# Patient Record
Sex: Female | Born: 1940 | Race: Black or African American | Hispanic: No | Smoking: Former smoker
Health system: Southern US, Community
[De-identification: ages and names within clinical notes are randomized; demographics above are authoritative.]

## PROBLEM LIST (undated history)

## (undated) DIAGNOSIS — M79673 Pain in unspecified foot: Secondary | ICD-10-CM

## (undated) DIAGNOSIS — R5382 Chronic fatigue, unspecified: Secondary | ICD-10-CM

## (undated) DIAGNOSIS — C3490 Malignant neoplasm of unspecified part of unspecified bronchus or lung: Secondary | ICD-10-CM

## (undated) DIAGNOSIS — C3491 Malignant neoplasm of unspecified part of right bronchus or lung: Principal | ICD-10-CM

## (undated) DIAGNOSIS — I10 Essential (primary) hypertension: Secondary | ICD-10-CM

## (undated) DIAGNOSIS — Z5112 Encounter for antineoplastic immunotherapy: Secondary | ICD-10-CM

## (undated) DIAGNOSIS — M25559 Pain in unspecified hip: Secondary | ICD-10-CM

## (undated) DIAGNOSIS — E7800 Pure hypercholesterolemia, unspecified: Secondary | ICD-10-CM

## (undated) DIAGNOSIS — C3492 Malignant neoplasm of unspecified part of left bronchus or lung: Principal | ICD-10-CM

## (undated) DIAGNOSIS — E039 Hypothyroidism, unspecified: Secondary | ICD-10-CM

## (undated) DIAGNOSIS — C419 Malignant neoplasm of bone and articular cartilage, unspecified: Secondary | ICD-10-CM

## (undated) DIAGNOSIS — R300 Dysuria: Secondary | ICD-10-CM

## (undated) HISTORY — DX: Dysuria: R30.0

## (undated) HISTORY — DX: Encounter for antineoplastic immunotherapy: Z51.12

## (undated) HISTORY — DX: Pure hypercholesterolemia, unspecified: E78.00

## (undated) HISTORY — DX: Pain in unspecified foot: M79.673

## (undated) HISTORY — DX: Chronic fatigue, unspecified: R53.82

## (undated) HISTORY — DX: Hypothyroidism, unspecified: E03.9

## (undated) HISTORY — DX: Pain in unspecified hip: M25.559

## (undated) HISTORY — DX: Essential (primary) hypertension: I10

## (undated) HISTORY — DX: Malignant neoplasm of unspecified part of left bronchus or lung: C34.92

## (undated) HISTORY — DX: Malignant neoplasm of unspecified part of right bronchus or lung: C34.91

---

## 1998-03-13 ENCOUNTER — Other Ambulatory Visit: Admission: RE | Admit: 1998-03-13 | Discharge: 1998-03-13 | Payer: Self-pay | Admitting: Emergency Medicine

## 2000-03-28 ENCOUNTER — Encounter: Admission: RE | Admit: 2000-03-28 | Discharge: 2000-03-28 | Payer: Self-pay | Admitting: Emergency Medicine

## 2000-03-28 ENCOUNTER — Encounter: Payer: Self-pay | Admitting: Emergency Medicine

## 2003-05-03 ENCOUNTER — Encounter: Admission: RE | Admit: 2003-05-03 | Discharge: 2003-05-03 | Payer: Self-pay | Admitting: Emergency Medicine

## 2003-05-03 ENCOUNTER — Encounter: Payer: Self-pay | Admitting: Emergency Medicine

## 2003-07-13 ENCOUNTER — Encounter (INDEPENDENT_AMBULATORY_CARE_PROVIDER_SITE_OTHER): Payer: Self-pay | Admitting: Specialist

## 2003-07-13 ENCOUNTER — Ambulatory Visit (HOSPITAL_COMMUNITY): Admission: RE | Admit: 2003-07-13 | Discharge: 2003-07-13 | Payer: Self-pay | Admitting: Gastroenterology

## 2004-12-28 ENCOUNTER — Encounter: Admission: RE | Admit: 2004-12-28 | Discharge: 2004-12-28 | Payer: Self-pay | Admitting: Emergency Medicine

## 2005-05-30 ENCOUNTER — Other Ambulatory Visit: Admission: RE | Admit: 2005-05-30 | Discharge: 2005-05-30 | Payer: Self-pay | Admitting: Internal Medicine

## 2006-01-07 ENCOUNTER — Encounter: Admission: RE | Admit: 2006-01-07 | Discharge: 2006-01-07 | Payer: Self-pay | Admitting: Endocrinology

## 2006-01-31 ENCOUNTER — Encounter: Admission: RE | Admit: 2006-01-31 | Discharge: 2006-01-31 | Payer: Self-pay | Admitting: Endocrinology

## 2007-02-25 ENCOUNTER — Encounter: Admission: RE | Admit: 2007-02-25 | Discharge: 2007-02-25 | Payer: Self-pay | Admitting: Orthopaedic Surgery

## 2007-09-22 ENCOUNTER — Other Ambulatory Visit: Admission: RE | Admit: 2007-09-22 | Discharge: 2007-09-22 | Payer: Self-pay | Admitting: Pediatrics

## 2007-09-24 ENCOUNTER — Encounter: Admission: RE | Admit: 2007-09-24 | Discharge: 2007-09-24 | Payer: Self-pay | Admitting: Family Medicine

## 2009-08-25 ENCOUNTER — Encounter: Admission: RE | Admit: 2009-08-25 | Discharge: 2009-08-25 | Payer: Self-pay | Admitting: Emergency Medicine

## 2009-09-05 ENCOUNTER — Ambulatory Visit: Payer: Self-pay | Admitting: Thoracic Surgery

## 2009-09-14 ENCOUNTER — Ambulatory Visit (HOSPITAL_COMMUNITY): Admission: RE | Admit: 2009-09-14 | Discharge: 2009-09-14 | Payer: Self-pay | Admitting: Emergency Medicine

## 2009-09-14 ENCOUNTER — Ambulatory Visit (HOSPITAL_COMMUNITY): Admission: RE | Admit: 2009-09-14 | Discharge: 2009-09-14 | Payer: Self-pay | Admitting: Thoracic Surgery

## 2009-09-19 ENCOUNTER — Ambulatory Visit: Payer: Self-pay | Admitting: Thoracic Surgery

## 2009-09-28 ENCOUNTER — Encounter: Payer: Self-pay | Admitting: Thoracic Surgery

## 2009-09-28 ENCOUNTER — Ambulatory Visit: Payer: Self-pay | Admitting: Thoracic Surgery

## 2009-09-28 ENCOUNTER — Inpatient Hospital Stay (HOSPITAL_COMMUNITY): Admission: RE | Admit: 2009-09-28 | Discharge: 2009-10-08 | Payer: Self-pay | Admitting: Thoracic Surgery

## 2009-09-28 ENCOUNTER — Other Ambulatory Visit: Payer: Self-pay | Admitting: Thoracic Surgery

## 2009-09-28 HISTORY — PX: VIDEO ASSISTED THORACOSCOPY: SHX5073

## 2009-09-28 HISTORY — PX: LUNG LOBECTOMY: SHX167

## 2009-09-28 HISTORY — PX: THORACOTOMY: SHX5074

## 2009-10-18 ENCOUNTER — Encounter: Admission: RE | Admit: 2009-10-18 | Discharge: 2009-10-18 | Payer: Self-pay | Admitting: Thoracic Surgery

## 2009-10-18 ENCOUNTER — Ambulatory Visit: Payer: Self-pay | Admitting: Thoracic Surgery

## 2009-11-22 ENCOUNTER — Ambulatory Visit: Payer: Self-pay | Admitting: Thoracic Surgery

## 2009-11-22 ENCOUNTER — Encounter: Admission: RE | Admit: 2009-11-22 | Discharge: 2009-11-22 | Payer: Self-pay | Admitting: Thoracic Surgery

## 2009-11-22 ENCOUNTER — Ambulatory Visit: Payer: Self-pay | Admitting: Internal Medicine

## 2009-11-29 LAB — COMPREHENSIVE METABOLIC PANEL
Albumin: 4.5 g/dL (ref 3.5–5.2)
BUN: 10 mg/dL (ref 6–23)
CO2: 23 mEq/L (ref 19–32)
Calcium: 10.1 mg/dL (ref 8.4–10.5)
Chloride: 107 mEq/L (ref 96–112)
Glucose, Bld: 97 mg/dL (ref 70–99)
Potassium: 3.9 mEq/L (ref 3.5–5.3)
Sodium: 143 mEq/L (ref 135–145)
Total Protein: 7.7 g/dL (ref 6.0–8.3)

## 2009-11-29 LAB — CBC WITH DIFFERENTIAL/PLATELET
Basophils Absolute: 0 10*3/uL (ref 0.0–0.1)
Eosinophils Absolute: 0.2 10*3/uL (ref 0.0–0.5)
HGB: 12 g/dL (ref 11.6–15.9)
MCV: 88.8 fL (ref 79.5–101.0)
MONO#: 0.5 10*3/uL (ref 0.1–0.9)
NEUT#: 5.6 10*3/uL (ref 1.5–6.5)
RBC: 4.07 10*6/uL (ref 3.70–5.45)
RDW: 16.6 % — ABNORMAL HIGH (ref 11.2–14.5)
WBC: 8.2 10*3/uL (ref 3.9–10.3)

## 2010-01-30 ENCOUNTER — Ambulatory Visit: Payer: Self-pay | Admitting: Thoracic Surgery

## 2010-01-30 ENCOUNTER — Encounter: Admission: RE | Admit: 2010-01-30 | Discharge: 2010-01-30 | Payer: Self-pay | Admitting: Thoracic Surgery

## 2010-02-21 ENCOUNTER — Ambulatory Visit: Payer: Self-pay | Admitting: Internal Medicine

## 2010-02-22 ENCOUNTER — Ambulatory Visit (HOSPITAL_COMMUNITY): Admission: RE | Admit: 2010-02-22 | Discharge: 2010-02-22 | Payer: Self-pay | Admitting: Internal Medicine

## 2010-02-22 LAB — COMPREHENSIVE METABOLIC PANEL
Albumin: 4 g/dL (ref 3.5–5.2)
Alkaline Phosphatase: 70 U/L (ref 39–117)
BUN: 11 mg/dL (ref 6–23)
CO2: 27 mEq/L (ref 19–32)
Glucose, Bld: 86 mg/dL (ref 70–99)
Potassium: 3.9 mEq/L (ref 3.5–5.3)
Sodium: 141 mEq/L (ref 135–145)
Total Bilirubin: 0.4 mg/dL (ref 0.3–1.2)
Total Protein: 7.1 g/dL (ref 6.0–8.3)

## 2010-02-22 LAB — CBC WITH DIFFERENTIAL/PLATELET
Basophils Absolute: 0 10*3/uL (ref 0.0–0.1)
Eosinophils Absolute: 0.1 10*3/uL (ref 0.0–0.5)
HCT: 38.2 % (ref 34.8–46.6)
HGB: 12.9 g/dL (ref 11.6–15.9)
LYMPH%: 25.8 % (ref 14.0–49.7)
MCV: 88 fL (ref 79.5–101.0)
MONO#: 0.5 10*3/uL (ref 0.1–0.9)
MONO%: 7.7 % (ref 0.0–14.0)
NEUT#: 4.5 10*3/uL (ref 1.5–6.5)
Platelets: 215 10*3/uL (ref 145–400)
WBC: 7 10*3/uL (ref 3.9–10.3)

## 2010-02-27 ENCOUNTER — Ambulatory Visit: Payer: Self-pay | Admitting: Thoracic Surgery

## 2010-08-22 ENCOUNTER — Ambulatory Visit: Payer: Self-pay | Admitting: Internal Medicine

## 2010-08-30 ENCOUNTER — Ambulatory Visit (HOSPITAL_COMMUNITY): Admission: RE | Admit: 2010-08-30 | Discharge: 2010-08-30 | Payer: Self-pay | Admitting: Internal Medicine

## 2010-08-30 LAB — CBC WITH DIFFERENTIAL/PLATELET
BASO%: 0.6 % (ref 0.0–2.0)
Basophils Absolute: 0 10*3/uL (ref 0.0–0.1)
EOS%: 2.2 % (ref 0.0–7.0)
HGB: 12.8 g/dL (ref 11.6–15.9)
MCH: 31.2 pg (ref 25.1–34.0)
MCHC: 33.4 g/dL (ref 31.5–36.0)
MCV: 93.2 fL (ref 79.5–101.0)
MONO%: 7.2 % (ref 0.0–14.0)
RDW: 15.3 % — ABNORMAL HIGH (ref 11.2–14.5)

## 2010-08-30 LAB — COMPREHENSIVE METABOLIC PANEL
ALT: 15 U/L (ref 0–35)
AST: 14 U/L (ref 0–37)
Albumin: 4.2 g/dL (ref 3.5–5.2)
Alkaline Phosphatase: 50 U/L (ref 39–117)
BUN: 10 mg/dL (ref 6–23)
Chloride: 109 mEq/L (ref 96–112)
Potassium: 4 mEq/L (ref 3.5–5.3)
Sodium: 145 mEq/L (ref 135–145)
Total Protein: 7.5 g/dL (ref 6.0–8.3)

## 2010-09-11 ENCOUNTER — Ambulatory Visit: Payer: Self-pay | Admitting: Thoracic Surgery

## 2010-11-02 ENCOUNTER — Other Ambulatory Visit: Payer: Self-pay | Admitting: Internal Medicine

## 2010-11-02 DIAGNOSIS — Z85118 Personal history of other malignant neoplasm of bronchus and lung: Secondary | ICD-10-CM

## 2011-01-14 LAB — CBC
HCT: 26 % — ABNORMAL LOW (ref 36.0–46.0)
Hemoglobin: 9 g/dL — ABNORMAL LOW (ref 12.0–15.0)
Hemoglobin: 9.4 g/dL — ABNORMAL LOW (ref 12.0–15.0)
Hemoglobin: 9.5 g/dL — ABNORMAL LOW (ref 12.0–15.0)
Hemoglobin: 9.7 g/dL — ABNORMAL LOW (ref 12.0–15.0)
MCHC: 33.4 g/dL (ref 30.0–36.0)
MCHC: 33.4 g/dL (ref 30.0–36.0)
MCHC: 33.6 g/dL (ref 30.0–36.0)
MCHC: 33.7 g/dL (ref 30.0–36.0)
MCHC: 33.9 g/dL (ref 30.0–36.0)
MCHC: 34.2 g/dL (ref 30.0–36.0)
MCHC: 34.3 g/dL (ref 30.0–36.0)
MCHC: 34.7 g/dL (ref 30.0–36.0)
MCV: 91.3 fL (ref 78.0–100.0)
MCV: 92.1 fL (ref 78.0–100.0)
MCV: 92.2 fL (ref 78.0–100.0)
MCV: 92.3 fL (ref 78.0–100.0)
MCV: 92.5 fL (ref 78.0–100.0)
MCV: 93.4 fL (ref 78.0–100.0)
Platelets: 211 10*3/uL (ref 150–400)
Platelets: 277 10*3/uL (ref 150–400)
Platelets: 392 10*3/uL (ref 150–400)
Platelets: 459 10*3/uL — ABNORMAL HIGH (ref 150–400)
Platelets: 513 10*3/uL — ABNORMAL HIGH (ref 150–400)
RBC: 2.79 MIL/uL — ABNORMAL LOW (ref 3.87–5.11)
RBC: 2.95 MIL/uL — ABNORMAL LOW (ref 3.87–5.11)
RBC: 3.07 MIL/uL — ABNORMAL LOW (ref 3.87–5.11)
RBC: 3.11 MIL/uL — ABNORMAL LOW (ref 3.87–5.11)
RBC: 3.21 MIL/uL — ABNORMAL LOW (ref 3.87–5.11)
RDW: 13.7 % (ref 11.5–15.5)
RDW: 13.7 % (ref 11.5–15.5)
RDW: 13.8 % (ref 11.5–15.5)
RDW: 14.3 % (ref 11.5–15.5)
RDW: 14.4 % (ref 11.5–15.5)
RDW: 14.5 % (ref 11.5–15.5)
WBC: 19.1 10*3/uL — ABNORMAL HIGH (ref 4.0–10.5)
WBC: 19.8 10*3/uL — ABNORMAL HIGH (ref 4.0–10.5)
WBC: 21.3 10*3/uL — ABNORMAL HIGH (ref 4.0–10.5)

## 2011-01-14 LAB — COMPREHENSIVE METABOLIC PANEL
ALT: 15 U/L (ref 0–35)
ALT: 74 U/L — ABNORMAL HIGH (ref 0–35)
AST: 61 U/L — ABNORMAL HIGH (ref 0–37)
Albumin: 2 g/dL — ABNORMAL LOW (ref 3.5–5.2)
Alkaline Phosphatase: 55 U/L (ref 39–117)
CO2: 24 mEq/L (ref 19–32)
CO2: 26 mEq/L (ref 19–32)
Calcium: 8.4 mg/dL (ref 8.4–10.5)
Creatinine, Ser: 0.83 mg/dL (ref 0.4–1.2)
GFR calc Af Amer: >60 mL/min (ref >60)
GFR calc non Af Amer: >60 mL/min (ref >60)
Glucose, Bld: 118 mg/dL — ABNORMAL HIGH (ref 70–99)
Potassium: 3.8 mEq/L (ref 3.5–5.1)
Sodium: 133 mEq/L — ABNORMAL LOW (ref 135–145)
Sodium: 138 mEq/L (ref 135–145)
Total Protein: 5.5 g/dL — ABNORMAL LOW (ref 6.0–8.3)
Total Protein: 6.1 g/dL (ref 6.0–8.3)

## 2011-01-14 LAB — BASIC METABOLIC PANEL
BUN: 3 mg/dL — ABNORMAL LOW (ref 6–23)
BUN: 6 mg/dL (ref 6–23)
CO2: 25 mEq/L (ref 19–32)
CO2: 27 mEq/L (ref 19–32)
CO2: 27 mEq/L (ref 19–32)
Calcium: 8.3 mg/dL — ABNORMAL LOW (ref 8.4–10.5)
Calcium: 8.3 mg/dL — ABNORMAL LOW (ref 8.4–10.5)
Calcium: 8.4 mg/dL (ref 8.4–10.5)
Chloride: 102 mEq/L (ref 96–112)
GFR calc non Af Amer: >60 mL/min (ref >60)
GFR calc non Af Amer: >60 mL/min (ref >60)
GFR calc non Af Amer: >60 mL/min (ref >60)
Glucose, Bld: 142 mg/dL — ABNORMAL HIGH (ref 70–99)
Glucose, Bld: 146 mg/dL — ABNORMAL HIGH (ref 70–99)
Glucose, Bld: 98 mg/dL (ref 70–99)
Potassium: 3.7 mEq/L (ref 3.5–5.1)
Sodium: 133 mEq/L — ABNORMAL LOW (ref 135–145)
Sodium: 134 mEq/L — ABNORMAL LOW (ref 135–145)

## 2011-01-14 LAB — CULTURE, RESPIRATORY W GRAM STAIN: Culture: NORMAL

## 2011-01-14 LAB — POCT I-STAT 3, ART BLOOD GAS (G3+)
Bicarbonate: 24.8 mEq/L — ABNORMAL HIGH (ref 20.0–24.0)
Patient temperature: 98.9
TCO2: 26 mmol/L (ref 0–100)

## 2011-01-14 LAB — GLUCOSE, CAPILLARY: Glucose-Capillary: 145 mg/dL — ABNORMAL HIGH (ref 70–99)

## 2011-01-14 LAB — CULTURE, BLOOD (ROUTINE X 2)

## 2011-01-14 LAB — EXPECTORATED SPUTUM ASSESSMENT W GRAM STAIN, RFLX TO RESP C

## 2011-01-14 LAB — URINE CULTURE: Culture: NO GROWTH

## 2011-01-15 LAB — TYPE AND SCREEN
ABO/RH(D): A POS
Antibody Screen: NEGATIVE

## 2011-01-15 LAB — COMPREHENSIVE METABOLIC PANEL
ALT: 14 U/L (ref 0–35)
AST: 14 U/L (ref 0–37)
Albumin: 3.8 g/dL (ref 3.5–5.2)
CO2: 22 mEq/L (ref 19–32)
Calcium: 9.3 mg/dL (ref 8.4–10.5)
Chloride: 106 mEq/L (ref 96–112)
Creatinine, Ser: 0.71 mg/dL (ref 0.4–1.2)
GFR calc Af Amer: >60 mL/min (ref >60)
Sodium: 137 mEq/L (ref 135–145)

## 2011-01-15 LAB — APTT: aPTT: 30 seconds (ref 24–37)

## 2011-01-15 LAB — URINALYSIS, ROUTINE W REFLEX MICROSCOPIC
Glucose, UA: NEGATIVE mg/dL
Nitrite: NEGATIVE
Specific Gravity, Urine: 1.02 (ref 1.005–1.030)
pH: 6 (ref 5.0–8.0)

## 2011-01-15 LAB — BLOOD GAS, ARTERIAL
Drawn by: 206361
Patient temperature: 98.6
TCO2: 24.3 mmol/L (ref 0–100)
pCO2 arterial: 36.2 mmHg (ref 35.0–45.0)
pH, Arterial: 7.422 — ABNORMAL HIGH (ref 7.350–7.400)

## 2011-01-15 LAB — CBC
MCHC: 34.4 g/dL (ref 30.0–36.0)
MCV: 93.8 fL (ref 78.0–100.0)
Platelets: 249 10*3/uL (ref 150–400)
RBC: 3.72 MIL/uL — ABNORMAL LOW (ref 3.87–5.11)
WBC: 11.1 10*3/uL — ABNORMAL HIGH (ref 4.0–10.5)

## 2011-01-15 LAB — ABO/RH: ABO/RH(D): A POS

## 2011-02-26 NOTE — Letter (Signed)
November 22, 2009   Velora Heckler. Arbutus Ped, MD  [address]  [place], [place] [address]   Re:  ARIYAN, SINNETT              DOB:  1941/09/04   Dear Arbutus Ped,   I saw the patient back today.  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  She is doing well overall.  Her blood pressure is 156/82, pulse 67, respirations 18, sats were 98%.  Lungs are clear to auscultation and percussion.  Incisions are well  healed.  I will refer her to you for evaluation.  I appreciate the  opportunity of seeing the patient.  I will see her back again in 2  months with a chest x-ray.   Sincerely,   Ines Bloomer, M.D.  Electronically Signed   DPB/MEDQ  D:  11/22/2009  T:  11/23/2009  Job:  [id number]

## 2011-02-26 NOTE — Letter (Signed)
September 19, 2009   Brett Canales A. Terri Alberts, MD  [address]  [place], [place] [address]   Re:  [name], [name]              DOB:  1940-12-31   Dear Brett Canales,   I saw the patient in the office today and I reviewed her findings.  Her  brain scan was negative.  Her PET scan showed increased uptake in her  right upper lobe lesion.  There was just marginal uptake in her  mediastinal nodes and her brain scan showed no metastatic disease.  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  The  uptake in the right upper lobe was 21, so this is a fairly rapidly  growing cancer.  I have recommended she get a right upper lobectomy with  node dissection and she agrees to the surgery.  We plan to do this on  September 28, 2009, at [facility].  I appreciate the opportunity of  seeing the patient.   Sincerely,   [name], M.D.  Electronically Signed   DPB/MEDQ  D:  09/19/2009  T:  09/20/2009  Job:  [id number]

## 2011-02-26 NOTE — Assessment & Plan Note (Signed)
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  She states that she did  quit smoking at the time of diagnosis and has not smoked at all since.  She does complain of some shortness of breath with fairly low levels of  exertion.  She denies cough or hemoptysis.  She denies fevers, chills,  or other constitutional symptoms.  She has put on approximately 15  pounds in the past 2 months or so, but does state that she is not eating  in a healthy manner and is not exercising.  She is seen on today's date  in routine followup.  She had a CT scan done on Feb 22, 2010; and  subsequent to that, she saw Dr. Arbutus Ped.  The CT scan showed no evidence  of recurrent tumor.  There is stable ground-glass opacity throughout  both lungs since the CT 6 months previous.  Continued attention to these  lesions on followup is felt to be warranted to exclude a slow-growing  bronchioalveolar carcinoma.  Additionally, there is a stable cystic  lesion in the superior segment of the left lower lobe with thickening of  its posterior and lateral wall adjacent to the pleura.  This is felt to  probably represent pleural parenchymal scarring adjacent to the cystic  lesion.  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   PHYSICAL EXAMINATION:  Vital Signs:  Blood pressure is 152/83, pulse 68,  respirations 18, and oxygen  saturation is 97% on room air.  General  Appearance:  This is a well-developed adult female, in no acute  distress.  Pulmonary:  Clear breath sounds throughout without wheeze,  without crackles or rhonchi.  Cardiac:  Regular rate and rhythm without  murmurs, gallops, or rubs.  The incisions are inspected well healed with  no evidence of infection.   ASSESSMENT:  The patient continues to make good overall recovery  following her thoracotomy and right upper lobe resection for carcinoma.  She states that she has a planned appointment to see Dr. Arbutus Ped and  obtain a repeat CT scan in 6 months.  We will not order any tests prior  to that.  Upon getting the CT scan, we will see her again in the office  at that time.   Rowe Clack, P.A.-C.   [name]  D:  02/27/2010  T:  02/27/2010  Job:  [id number]   cc:   Lajuana Matte, MD

## 2011-02-26 NOTE — Letter (Signed)
September 11, 2010   Velora Heckler. Arbutus Ped, MD  [address]  [place], [place] [address]   Re:  DILAN, FULLENWIDER              DOB:  Aug 17, 1941   Dear Dr. Arbutus Ped:   The patient returned today.  Her CT scan showed no change and no  evidence of recurrence of her cancer.  She still has several areas in  the right lower lobe and left side where there was significant scarring  which could possibly go along with a recurrent cancer.  She will get  another CT since it has been over a year since we did a right upper  lobectomy.  We will continue to follow her and get another CT scan in 6  months.  Her blood pressure is 160/78, pulse 74, respirations 18, sats  are 98%.   Ines Bloomer, M.D.  Electronically Signed   DPB/MEDQ  D:  09/11/2010  T:  09/12/2010  Job:  [id number]   cc:   Brett Canales A. Cleta Alberts, M.D.

## 2011-02-26 NOTE — Assessment & Plan Note (Signed)
OFFICE VISIT   ILA, LANDOWSKI  DOB:  1940-11-16                                        October 18, 2009  CHART #:  [id number]   The patient returned today after having a right upper lobectomy.  She is  doing well overall.  Her incisions are well healed.  Her problems as far  as pneumonia have resolved.  Her sats were 94%.  Her blood pressure was  129/70, pulse 94, and respirations 18.  I plan to see her back again in  4 weeks with another chest x-ray and at that time we will probably refer  her to see Dr. Arbutus Ped for followup.   Ines Bloomer, M.D.  Electronically Signed   DPB/MEDQ  D:  10/18/2009  T:  10/18/2009  Job:  [id number]

## 2011-02-26 NOTE — Assessment & Plan Note (Signed)
OFFICE VISIT   [name], [name]  DOB:  [birth year]                                        January 30, 2010  CHART #:  [id number]   HISTORY:  The patient comes in today for a 84-month followup.  She is  status post a right upper lobectomy on September 28, 2009, for  bronchoalveolar and adenosquamous carcinoma, stage IB.  Since her last  visit, she saw Dr. Arbutus Ped and he discussed possible adjuvant  chemotherapy which the patient was not interested in pursuing at this  time.  He did recommend a 68-month followup which will be in May and at  that time a CT will be performed.  She has had a lot of stress in the  last week with the death of her mother.  She has noted an increase in  shortness of breath over that period of time although she denies any  chest pain, weight loss, fevers, chills, or hemoptysis.  Prior to that,  she had been stable since surgery.   PHYSICAL EXAMINATION:  Vital Signs:  Blood pressure is 182/91, pulse is  65, respirations 18, O2 sat 96% on room air.  Chest:  Her right  thoracotomy and chest tube sites have all healed well.  Heart:  Regular  rate and rhythm.  Lungs:  Clear to auscultation.   DIAGNOSTIC TESTS:  Chest x-ray is stable from her prior visit.   ASSESSMENT AND PLAN:  The patient has continued to remain stable since  her last postoperative check.  She was seen and evaluated by Dr. Edwyna Shell  today and her films were reviewed.  He has reassured her about her  shortness of breath.  We will plan to see her back in May following her  visit with Dr. Arbutus Ped and her CT scan.   Ines Bloomer, M.D.  Electronically Signed   GC/MEDQ  D:  01/30/2010  T:  01/31/2010  Job:  [id number]   cc:   Lajuana Matte, MD

## 2011-02-26 NOTE — Letter (Signed)
September 05, 2009   Brett Canales A. Cleta Alberts, MD  [address]  [place], [place] [address]   Re:  [name], [name]              DOB:  1941-04-17   Dear Brett Canales,   I appreciate the opportunity to see the patient.  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  She has had a long history of smoking of over 40  years.  She has had no hemoptysis.  She was found to have a right upper  lobe lesion and scar in her left lung.  A CT scan showed no mediastinal  adenopathy, but did show a 3-cm right upper lobe lesion, histories of  chronic obstructive pulmonary disease.  There is also multifocal other  lesions in the left upper lobe, right middle lobe, left lower lobe but  most prominent one is a 3-cm lesion in the right upper lobe.  She has a  history of being exposed to TB in the past.   MEDICATION:  Lisinopril 40 mg a day, amlodipine 5 mg daily, Synthroid 75  mcg 3 days a week, Synthroid 50 mcg 4 days a week, clonazepam 0.5 twice  daily, timolol eye drops twice daily, fish oil, vitamins.   FAMILY HISTORY:  Noncontributory.   SOCIAL HISTORY:  She is single, still works.  Comes in today with her  daughter who works at [employer].   REVIEW OF SYSTEMS:  CARDIAC:  No angina or atrial fibrillation.  PULMONARY:  See history of present illness.  History of wheezing and  pneumonia.  GI:  No nausea, vomiting, constipation, or diarrhea.  GU:  No kidney disease, dysuria, or frequent urination.  VASCULAR:  No claudication, DVT, or TIA's.  NEUROLOGICAL:  No dizziness, headaches, blackouts, or seizures.  MUSCULOSKELETAL:  No arthritis.  GU:  No kidney disease, dysuria, or frequent urination.  VASCULAR:  She has pain in her legs with walking.  No DVT or TIA's.  NEUROLOGICAL:  No dizziness headaches, blackouts, or seizures.  MUSCULOSKELETAL:  Arthritis.  PSYCHIATRIC:  Nervousness.  EYE/ENT:  No change in eyesight or hearing.  HEMATOLOGIC:  No problems  with bleeding, clotting disorders, anemia.   She has been treated for hypercholesterolemia as well as hypertension.   ALLERGIES:  She is allergic to sulfa and penicillin.   PHYSICAL EXAMINATION:  General:  She is a thin Caucasian female in no  acute distress.  Vital Signs:  Blood pressure is 140/70.  Her weight is  123.  Her height is 64.2 inches.  Her pulses 76 and respirations 16.  Head, eyes, ears, nose, and throat:  Unremarkable.  Neck:  Supple  without thyromegaly.  There is no supraclavicular, axillary adenopathy.  Chest:  Clear to auscultation and percussion.  Heart:  Regular sinus  rhythm.  No murmurs.  Abdomen:  Soft.  There is no hepatosplenomegaly.  Extremities:  Pulses are 2+.  There is no clubbing or edema.  Neurologic:  She is oriented x3.  Sensory and motor intact.  Cranial  nerves intact.   I do not think that she definitely does have a cancer of the right upper  lobe and it is good that she has negative mediastinal adenopathy.  I  cannot tell about these other lesions, but I think they probably are  benign or chronic scarring.  We  will get a PET scan as well as a  pulmonary function tests and a brain  scan.  I will see her back again  after these are completed, and I did discuss with her and her daughter  about the treatments of surgery, chemotherapy, and radiation.   I appreciate the opportunity of seeing the patient.    Sincerely,   Ines Bloomer, M.D.  Electronically Signed   DPB/MEDQ  D:  09/05/2009  T:  09/06/2009  Job:  [id number]

## 2011-03-01 NOTE — Op Note (Signed)
NAME:  [name], [name]                          ACCOUNT NO.:  [id number]   MEDICAL RECORD NO.:  [id number]                   PATIENT TYPE:  AMB   LOCATION:  ENDO                                 FACILITY:  [facility]   PHYSICIAN:  Anselmo Rod, M.D.               DATE OF BIRTH:  1940/11/10   DATE OF PROCEDURE:  07/13/2003  DATE OF DISCHARGE:                                 OPERATIVE REPORT   PROCEDURE PERFORMED:  Esophagogastroduodenoscopy with biopsies.   ENDOSCOPIST:  Anselmo Rod, M.D.   INSTRUMENT USED:  Olympus video panendoscope.   INDICATION FOR PROCEDURE:  Epigastric pain with a history of reflux and  abnormal weight loss in a 70 year old female.  Rule out peptic ulcer  disease, gastric mass, polyps, etc.   PREPROCEDURE PREPARATION:  Informed consent was procured from the patient.  The patient was fasted for eight hours prior to the procedure.   PREPROCEDURE PHYSICAL:  VITAL SIGNS:  The patient had stable vital signs.  NECK:  Supple.  CHEST:  Clear to auscultation.  S1, S2 regular.  ABDOMEN:  Soft with normal bowel sounds.  Epigastric tenderness on palpation  with guarding, no rebound or rigidity.   DESCRIPTION OF PROCEDURE:  The patient was placed in the left lateral  decubitus position and sedated with 100 mg of Demerol and 10 mg of Versed  intravenously.  Once the patient was adequately sedate and maintained on low-  flow oxygen and continuous cardiac monitoring, the Olympus video  panendoscope was advanced through the mouthpiece, over the tongue, into the  esophagus under direct vision.  The entire esophagus appeared normal with no  evidence of ring, stricture, masses, esophagitis, or Barrett's mucosa.  The  scope was then advanced into the stomach.  There was atrophic gastritis  noted throughout the gastric mucosa with a small hiatal hernia seen on high  retroflexion.  A small nodular erythematous lesion was seen in the duodenal  bulb.  This was biopsied for  pathology.  The exact nature of this lesion is  unclear on visual inspection.  Small bowel distal to the bulb up to 60 cm  appeared normal.   IMPRESSION:  1. Normal-appearing esophagus.  2. Atrophic gastritis with small hiatal hernia.  3. Small erythematous nodule biopsied from duodenal bulb.  4. Normal small bowel distal to the bulb up to 60 cm.    RECOMMENDATIONS:  1. Await pathology results.  2. Avoid all nonsteroidals, including aspirin, for now.  3. Proceed with a colonoscopy at this time.  Further recommendations made     thereafter.                                               Anselmo Rod, M.D.    JNM/MEDQ  D:  07/13/2003  T:  07/13/2003  Job:  [id number]   cc:   Reuben Likes, M.D.  [address]  [place]  [place] [address]  Fax: [phone]   Angelia Mould. Derrell Lolling, M.D.  [address]  [place]  [place] [address]  Fax: [phone]

## 2011-03-08 ENCOUNTER — Other Ambulatory Visit: Payer: Self-pay | Admitting: Internal Medicine

## 2011-03-08 ENCOUNTER — Ambulatory Visit (HOSPITAL_COMMUNITY)
Admission: RE | Admit: 2011-03-08 | Discharge: 2011-03-08 | Disposition: A | Discharge status: Home / Self Care | Payer: Medicare Other | Source: Ambulatory Visit | Attending: Internal Medicine | Admitting: Internal Medicine

## 2011-03-08 ENCOUNTER — Encounter (HOSPITAL_BASED_OUTPATIENT_CLINIC_OR_DEPARTMENT_OTHER): Payer: Medicare Other | Admitting: Internal Medicine

## 2011-03-08 DIAGNOSIS — C3410 Malignant neoplasm of upper lobe, unspecified bronchus or lung: Secondary | ICD-10-CM

## 2011-03-08 DIAGNOSIS — K449 Diaphragmatic hernia without obstruction or gangrene: Secondary | ICD-10-CM | POA: Insufficient documentation

## 2011-03-08 DIAGNOSIS — J984 Other disorders of lung: Secondary | ICD-10-CM | POA: Insufficient documentation

## 2011-03-08 DIAGNOSIS — Z85118 Personal history of other malignant neoplasm of bronchus and lung: Secondary | ICD-10-CM

## 2011-03-08 DIAGNOSIS — E079 Disorder of thyroid, unspecified: Secondary | ICD-10-CM | POA: Insufficient documentation

## 2011-03-08 DIAGNOSIS — C3491 Malignant neoplasm of unspecified part of right bronchus or lung: Secondary | ICD-10-CM | POA: Insufficient documentation

## 2011-03-08 DIAGNOSIS — I2510 Atherosclerotic heart disease of native coronary artery without angina pectoris: Secondary | ICD-10-CM | POA: Insufficient documentation

## 2011-03-08 DIAGNOSIS — C3490 Malignant neoplasm of unspecified part of unspecified bronchus or lung: Secondary | ICD-10-CM

## 2011-03-08 DIAGNOSIS — R0602 Shortness of breath: Secondary | ICD-10-CM | POA: Insufficient documentation

## 2011-03-08 HISTORY — DX: Malignant neoplasm of unspecified part of unspecified bronchus or lung: C34.90

## 2011-03-08 HISTORY — DX: Malignant neoplasm of unspecified part of right bronchus or lung: C34.91

## 2011-03-08 LAB — CBC WITH DIFFERENTIAL/PLATELET
BASO%: 0.3 % (ref 0.0–2.0)
Basophils Absolute: 0 10*3/uL (ref 0.0–0.1)
EOS%: 1.9 % (ref 0.0–7.0)
HGB: 12.4 g/dL (ref 11.6–15.9)
MCH: 30.8 pg (ref 25.1–34.0)
MCHC: 33.7 g/dL (ref 31.5–36.0)
MCV: 91.4 fL (ref 79.5–101.0)
MONO%: 7.7 % (ref 0.0–14.0)
RDW: 14.8 % — ABNORMAL HIGH (ref 11.2–14.5)

## 2011-03-08 LAB — CMP (CANCER CENTER ONLY)
AST: 19 U/L (ref 11–38)
Albumin: 3.8 g/dL (ref 3.3–5.5)
Alkaline Phosphatase: 59 U/L (ref 26–84)
BUN, Bld: 9 mg/dL (ref 7–22)
Creat: 1 mg/dl (ref 0.6–1.2)
Potassium: 4.2 mEq/L (ref 3.3–4.7)

## 2011-03-08 MED ORDER — IOHEXOL 300 MG/ML  SOLN
100.0000 mL | Freq: Once | INTRAMUSCULAR | Status: AC | PRN
  Administered 2011-03-08: 100 mL via INTRAVENOUS

## 2011-03-12 ENCOUNTER — Encounter (HOSPITAL_BASED_OUTPATIENT_CLINIC_OR_DEPARTMENT_OTHER): Payer: Medicare Other | Admitting: Internal Medicine

## 2011-03-12 ENCOUNTER — Other Ambulatory Visit: Payer: Self-pay | Admitting: Internal Medicine

## 2011-03-12 DIAGNOSIS — C3410 Malignant neoplasm of upper lobe, unspecified bronchus or lung: Secondary | ICD-10-CM

## 2011-03-12 DIAGNOSIS — C3490 Malignant neoplasm of unspecified part of unspecified bronchus or lung: Secondary | ICD-10-CM

## 2011-03-19 ENCOUNTER — Ambulatory Visit (INDEPENDENT_AMBULATORY_CARE_PROVIDER_SITE_OTHER): Payer: Medicare Other | Admitting: Thoracic Surgery

## 2011-03-19 DIAGNOSIS — C3490 Malignant neoplasm of unspecified part of unspecified bronchus or lung: Secondary | ICD-10-CM

## 2011-03-20 NOTE — Letter (Signed)
March 19, 2011  Lajuana Matte, MD [phone] [place], [place] [address]  Re:  [name], [name]              DOB:  02-Oct-1941  Dear Arbutus Ped,  I saw the patient back in the office today and reviewed her CT scan, although there has really been lot of change.  She has recurrent bronchoalveolar cancer.  I think the best thing to do we could either watch this or proceed with biopsies and I tentatively scheduled her to have an electromagnetic navigation bronchoscopy on the June 20, at [facility].  We will let us know what our findings are.  Ines Bloomer, M.D. Electronically Signed  DPB/MEDQ  D:  03/19/2011  T:  03/20/2011  Job:  [id number]

## 2011-04-02 ENCOUNTER — Other Ambulatory Visit: Payer: Self-pay | Admitting: Thoracic Surgery

## 2011-04-02 ENCOUNTER — Ambulatory Visit (HOSPITAL_COMMUNITY)
Admission: RE | Admit: 2011-04-02 | Discharge: 2011-04-02 | Disposition: A | Discharge status: Home / Self Care | Payer: Medicare Other | Source: Ambulatory Visit | Attending: Thoracic Surgery | Admitting: Thoracic Surgery

## 2011-04-02 ENCOUNTER — Encounter (HOSPITAL_COMMUNITY)
Admission: RE | Admit: 2011-04-02 | Discharge: 2011-04-02 | Disposition: A | Discharge status: Home / Self Care | Payer: Medicare Other | Source: Ambulatory Visit | Attending: Thoracic Surgery | Admitting: Thoracic Surgery

## 2011-04-02 DIAGNOSIS — Z01810 Encounter for preprocedural cardiovascular examination: Secondary | ICD-10-CM | POA: Insufficient documentation

## 2011-04-02 DIAGNOSIS — J984 Other disorders of lung: Secondary | ICD-10-CM | POA: Insufficient documentation

## 2011-04-02 DIAGNOSIS — R918 Other nonspecific abnormal finding of lung field: Secondary | ICD-10-CM

## 2011-04-02 DIAGNOSIS — Z01818 Encounter for other preprocedural examination: Secondary | ICD-10-CM | POA: Insufficient documentation

## 2011-04-02 DIAGNOSIS — Z01812 Encounter for preprocedural laboratory examination: Secondary | ICD-10-CM | POA: Insufficient documentation

## 2011-04-02 LAB — CBC
HCT: 38.5 % (ref 36.0–46.0)
Hemoglobin: 12.6 g/dL (ref 12.0–15.0)
MCH: 29.7 pg (ref 26.0–34.0)
MCHC: 32.7 g/dL (ref 30.0–36.0)

## 2011-04-02 LAB — COMPREHENSIVE METABOLIC PANEL
ALT: 9 U/L (ref 0–35)
BUN: 7 mg/dL (ref 6–23)
Calcium: 9.9 mg/dL (ref 8.4–10.5)
GFR calc Af Amer: >60 mL/min (ref >60)
Glucose, Bld: 98 mg/dL (ref 70–99)
Sodium: 140 mEq/L (ref 135–145)
Total Protein: 6.9 g/dL (ref 6.0–8.3)

## 2011-04-02 LAB — SURGICAL PCR SCREEN: MRSA, PCR: NEGATIVE

## 2011-04-03 ENCOUNTER — Other Ambulatory Visit: Payer: Self-pay | Admitting: Thoracic Surgery

## 2011-04-03 ENCOUNTER — Ambulatory Visit (HOSPITAL_COMMUNITY): Payer: Medicare Other

## 2011-04-03 ENCOUNTER — Ambulatory Visit (HOSPITAL_COMMUNITY)
Admission: RE | Admit: 2011-04-03 | Discharge: 2011-04-03 | Disposition: A | Discharge status: Home / Self Care | Payer: Medicare Other | Source: Ambulatory Visit | Attending: Thoracic Surgery | Admitting: Thoracic Surgery

## 2011-04-03 DIAGNOSIS — Z01812 Encounter for preprocedural laboratory examination: Secondary | ICD-10-CM | POA: Insufficient documentation

## 2011-04-03 DIAGNOSIS — Z79899 Other long term (current) drug therapy: Secondary | ICD-10-CM | POA: Insufficient documentation

## 2011-04-03 DIAGNOSIS — C3430 Malignant neoplasm of lower lobe, unspecified bronchus or lung: Secondary | ICD-10-CM | POA: Insufficient documentation

## 2011-04-03 DIAGNOSIS — Z01818 Encounter for other preprocedural examination: Secondary | ICD-10-CM | POA: Insufficient documentation

## 2011-04-03 DIAGNOSIS — D381 Neoplasm of uncertain behavior of trachea, bronchus and lung: Secondary | ICD-10-CM

## 2011-04-03 DIAGNOSIS — Z01810 Encounter for preprocedural cardiovascular examination: Secondary | ICD-10-CM | POA: Insufficient documentation

## 2011-04-05 LAB — CULTURE, RESPIRATORY W GRAM STAIN: Gram Stain: NONE SEEN

## 2011-04-08 NOTE — Op Note (Signed)
  NAME:  [name], [name]              ACCOUNT NO.:  [id number]  MEDICAL RECORD NO.:  [id number]  LOCATION:  [facility]                         FACILITY:  [facility]  PHYSICIAN:  Ines Bloomer, M.D. DATE OF BIRTH:  October 27, 1940  DATE OF PROCEDURE:  04/03/2011 DATE OF DISCHARGE:  04/03/2011                              OPERATIVE REPORT   PREOPERATIVE DIAGNOSIS:  Right lower lobe and left lower lobe lesion.  POSTOPERATIVE DIAGNOSIS:  Right lower lobe and left lower lobe lesion.  OPERATION PERFORMED:  Fiberoptic bronchoscopy with endobronchial ultrasound.  SURGEON:  Ines Bloomer, MD  ANESTHESIA:  General anesthesia.  DESCRIPTION OF PROCEDURE:  After percutaneous insertion of all monitoring lines, the patient underwent general anesthesia.  The patient had a previous right upper lobe lobectomy for a bronchoalveolar cancer and had several nodules in the lung, one of which is slightly increased in density and another one is slightly increased in size.  One was in the left lower lobe and another in the right lower lobe.  After general anesthesia, the video bronchoscope was passed through the endotracheal tube.  The carina was midline.  The left mainstem, left upper lobe, and left lower lobe orifices were normal.  The right mainstem bronchus was normal.  Right lower lobe and right middle lobe orifices were normal. Right mainstem bronchus was normal.  The stump of the right upper lobe pictures were taken.  There was some distortion of the bronchus intermedius which we got the scope through easily and see the right lower lobe and the right middle lobe.  We then inserted the locatable guide with the working channel and did an automatic registration without difficulty.  I then started navigation down to the left lower lobe superior segment and navigated with the locatable guide down within 1/2 cm of the lesion.  This was seen on fluoro and we then through the extended working channel passed  several brushes and biopsies of this area and then did a BAL on this area.  After this had been done, we then navigated down to the right lower lobe basilar lesion, got within about a centimeter and half of this and used the brushings and biopsies from this area and then did a BAL on this area like again through the extended working channel using the locatable guide to navigate to this area.  After this had been done, we then checked to make sure there was no pneumothorax and no bleeding and then removed the scope.  The patient tolerated the procedure well and was returned to the recovery room in stable condition.     Ines Bloomer, M.D.     DPB/MEDQ  D:  04/03/2011  T:  04/03/2011  Job:  [id number]  Electronically Signed by Jovita Gamma M.D. on 04/08/2011 04:28:03 PM

## 2011-04-10 ENCOUNTER — Ambulatory Visit (INDEPENDENT_AMBULATORY_CARE_PROVIDER_SITE_OTHER): Payer: Medicare Other | Admitting: Thoracic Surgery

## 2011-04-10 ENCOUNTER — Other Ambulatory Visit: Payer: Self-pay | Admitting: Thoracic Surgery

## 2011-04-10 ENCOUNTER — Ambulatory Visit: Payer: Self-pay | Admitting: Thoracic Surgery

## 2011-04-10 DIAGNOSIS — C3490 Malignant neoplasm of unspecified part of unspecified bronchus or lung: Secondary | ICD-10-CM

## 2011-04-11 NOTE — Assessment & Plan Note (Signed)
OFFICE VISIT  Terri, [name] DOB:  [birth year]                                        April 10, 2011 CHART #:  [id number]  The patient came back after a fiberoptic bronchoscopy with endobronchial ultrasound.  Unfortunately, the left lower lobe biopsy was adenocarcinoma and the right lower lobe biopsy showed atypical cells. She also has some other questionable lesions in the right lower lobe and in the left upper lobe.  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  I will go ahead and get a PET scan on her and then refer her back to see Dr. Arbutus Ped as soon as possible.  Ines Bloomer, M.D. Electronically Signed  DPB/MEDQ  D:  04/10/2011  T:  04/11/2011  Job:  [id number]

## 2011-04-18 ENCOUNTER — Encounter (INDEPENDENT_AMBULATORY_CARE_PROVIDER_SITE_OTHER): Payer: Medicare Other

## 2011-04-18 ENCOUNTER — Ambulatory Visit (HOSPITAL_COMMUNITY)
Admission: RE | Admit: 2011-04-18 | Discharge: 2011-04-18 | Disposition: A | Discharge status: Home / Self Care | Payer: Medicare Other | Source: Ambulatory Visit | Attending: Thoracic Surgery | Admitting: Thoracic Surgery

## 2011-04-18 ENCOUNTER — Ambulatory Visit: Payer: Medicare Other | Admitting: Thoracic Surgery

## 2011-04-18 ENCOUNTER — Encounter (HOSPITAL_BASED_OUTPATIENT_CLINIC_OR_DEPARTMENT_OTHER): Payer: Medicare Other | Admitting: Internal Medicine

## 2011-04-18 ENCOUNTER — Encounter (HOSPITAL_COMMUNITY): Payer: Self-pay

## 2011-04-18 DIAGNOSIS — C3490 Malignant neoplasm of unspecified part of unspecified bronchus or lung: Secondary | ICD-10-CM

## 2011-04-18 DIAGNOSIS — Z902 Acquired absence of lung [part of]: Secondary | ICD-10-CM | POA: Insufficient documentation

## 2011-04-18 DIAGNOSIS — R918 Other nonspecific abnormal finding of lung field: Secondary | ICD-10-CM | POA: Insufficient documentation

## 2011-04-18 DIAGNOSIS — C3410 Malignant neoplasm of upper lobe, unspecified bronchus or lung: Secondary | ICD-10-CM | POA: Insufficient documentation

## 2011-04-18 HISTORY — DX: Malignant neoplasm of unspecified part of unspecified bronchus or lung: C34.90

## 2011-04-18 MED ORDER — FLUDEOXYGLUCOSE F - 18 (FDG) INJECTION
18.4000 | Freq: Once | INTRAVENOUS | Status: AC | PRN
  Administered 2011-04-18: 18.4 via INTRAVENOUS

## 2011-04-19 NOTE — Assessment & Plan Note (Signed)
OFFICE VISIT  [name], [name] DOB:  Nov 19, 1940                                        April 18, 2011 CHART #:  [id number]  The patient came for followup today and was seen by myself and Dr. Arbutus Ped.  Her PET scan showed low but definitely uptake in her left upper lobe, left lower lobe, and right lower lobe lesion.  She has a positive diagnosis of adenocarcinoma of left lower lobe and atypical cells in the right lower lobe.  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  We hopefully have sent some of them for ALK and EGFR but do not have the results.  Dr. Arbutus Ped will see her regarding starting chemotherapy.  Ines Bloomer, M.D. Electronically Signed  DPB/MEDQ  D:  04/18/2011  T:  04/18/2011  Job:  [id number]

## 2011-04-22 ENCOUNTER — Other Ambulatory Visit: Payer: Self-pay | Admitting: Internal Medicine

## 2011-04-22 DIAGNOSIS — C3490 Malignant neoplasm of unspecified part of unspecified bronchus or lung: Secondary | ICD-10-CM

## 2011-04-22 DIAGNOSIS — C7931 Secondary malignant neoplasm of brain: Secondary | ICD-10-CM

## 2011-04-24 ENCOUNTER — Ambulatory Visit (HOSPITAL_COMMUNITY)
Admission: RE | Admit: 2011-04-24 | Discharge: 2011-04-24 | Disposition: A | Discharge status: Home / Self Care | Payer: Medicare Other | Source: Ambulatory Visit | Attending: Internal Medicine | Admitting: Internal Medicine

## 2011-04-24 ENCOUNTER — Encounter (HOSPITAL_BASED_OUTPATIENT_CLINIC_OR_DEPARTMENT_OTHER): Payer: Medicare Other | Admitting: Internal Medicine

## 2011-04-24 DIAGNOSIS — C3410 Malignant neoplasm of upper lobe, unspecified bronchus or lung: Secondary | ICD-10-CM

## 2011-04-24 DIAGNOSIS — C7931 Secondary malignant neoplasm of brain: Secondary | ICD-10-CM

## 2011-04-24 DIAGNOSIS — C3490 Malignant neoplasm of unspecified part of unspecified bronchus or lung: Secondary | ICD-10-CM | POA: Insufficient documentation

## 2011-04-24 DIAGNOSIS — G319 Degenerative disease of nervous system, unspecified: Secondary | ICD-10-CM | POA: Insufficient documentation

## 2011-04-24 DIAGNOSIS — I739 Peripheral vascular disease, unspecified: Secondary | ICD-10-CM | POA: Insufficient documentation

## 2011-04-24 MED ORDER — IOHEXOL 300 MG/ML  SOLN
100.0000 mL | Freq: Once | INTRAMUSCULAR | Status: AC | PRN
  Administered 2011-04-24: 100 mL via INTRAVENOUS

## 2011-04-29 ENCOUNTER — Other Ambulatory Visit: Payer: Self-pay | Admitting: Internal Medicine

## 2011-04-29 ENCOUNTER — Encounter (HOSPITAL_BASED_OUTPATIENT_CLINIC_OR_DEPARTMENT_OTHER): Payer: Medicare Other | Admitting: Internal Medicine

## 2011-04-29 DIAGNOSIS — C3410 Malignant neoplasm of upper lobe, unspecified bronchus or lung: Secondary | ICD-10-CM

## 2011-04-29 DIAGNOSIS — C3490 Malignant neoplasm of unspecified part of unspecified bronchus or lung: Secondary | ICD-10-CM

## 2011-04-29 LAB — CBC WITH DIFFERENTIAL/PLATELET
Basophils Absolute: 0 10*3/uL (ref 0.0–0.1)
EOS%: 2.6 % (ref 0.0–7.0)
HCT: 37.6 % (ref 34.8–46.6)
HGB: 12.4 g/dL (ref 11.6–15.9)
MCH: 30.4 pg (ref 25.1–34.0)
MCV: 91.9 fL (ref 79.5–101.0)
MONO%: 8.1 % (ref 0.0–14.0)
NEUT%: 64.9 % (ref 38.4–76.8)
Platelets: 177 10*3/uL (ref 145–400)
lymph#: 2.2 10*3/uL (ref 0.9–3.3)

## 2011-04-29 LAB — COMPREHENSIVE METABOLIC PANEL
AST: 11 U/L (ref 0–37)
BUN: 7 mg/dL (ref 6–23)
Calcium: 10.1 mg/dL (ref 8.4–10.5)
Chloride: 105 mEq/L (ref 96–112)
Creatinine, Ser: 1 mg/dL (ref 0.50–1.10)
Glucose, Bld: 79 mg/dL (ref 70–99)

## 2011-04-29 LAB — LACTATE DEHYDROGENASE: LDH: 192 U/L (ref 94–250)

## 2011-04-30 ENCOUNTER — Ambulatory Visit (HOSPITAL_COMMUNITY): Payer: Medicare Other

## 2011-04-30 ENCOUNTER — Ambulatory Visit (HOSPITAL_COMMUNITY)
Admission: RE | Admit: 2011-04-30 | Discharge: 2011-04-30 | Disposition: A | Discharge status: Home / Self Care | Payer: Medicare Other | Source: Ambulatory Visit | Attending: Thoracic Surgery | Admitting: Thoracic Surgery

## 2011-04-30 DIAGNOSIS — C3430 Malignant neoplasm of lower lobe, unspecified bronchus or lung: Secondary | ICD-10-CM | POA: Insufficient documentation

## 2011-04-30 DIAGNOSIS — Z01812 Encounter for preprocedural laboratory examination: Secondary | ICD-10-CM | POA: Insufficient documentation

## 2011-04-30 DIAGNOSIS — Z87891 Personal history of nicotine dependence: Secondary | ICD-10-CM | POA: Insufficient documentation

## 2011-04-30 DIAGNOSIS — C3490 Malignant neoplasm of unspecified part of unspecified bronchus or lung: Secondary | ICD-10-CM

## 2011-04-30 DIAGNOSIS — E039 Hypothyroidism, unspecified: Secondary | ICD-10-CM | POA: Insufficient documentation

## 2011-04-30 DIAGNOSIS — I10 Essential (primary) hypertension: Secondary | ICD-10-CM | POA: Insufficient documentation

## 2011-04-30 DIAGNOSIS — Z902 Acquired absence of lung [part of]: Secondary | ICD-10-CM | POA: Insufficient documentation

## 2011-04-30 DIAGNOSIS — Z01818 Encounter for other preprocedural examination: Secondary | ICD-10-CM | POA: Insufficient documentation

## 2011-04-30 DIAGNOSIS — Z01811 Encounter for preprocedural respiratory examination: Secondary | ICD-10-CM

## 2011-04-30 LAB — PROTIME-INR: Prothrombin Time: 13.3 seconds (ref 11.6–15.2)

## 2011-04-30 LAB — COMPREHENSIVE METABOLIC PANEL
ALT: 8 U/L (ref 0–35)
Alkaline Phosphatase: 72 U/L (ref 39–117)
CO2: 28 mEq/L (ref 19–32)
GFR calc Af Amer: >60 mL/min (ref >60)
Glucose, Bld: 107 mg/dL — ABNORMAL HIGH (ref 70–99)
Potassium: 3.6 mEq/L (ref 3.5–5.1)
Sodium: 142 mEq/L (ref 135–145)
Total Protein: 7.2 g/dL (ref 6.0–8.3)

## 2011-04-30 LAB — CBC
MCV: 90.8 fL (ref 78.0–100.0)
Platelets: 172 10*3/uL (ref 150–400)
RBC: 4.02 MIL/uL (ref 3.87–5.11)
WBC: 8.3 10*3/uL (ref 4.0–10.5)

## 2011-05-01 ENCOUNTER — Ambulatory Visit (HOSPITAL_COMMUNITY)
Admission: RE | Admit: 2011-05-01 | Discharge: 2011-05-01 | Disposition: A | Discharge status: Home / Self Care | Payer: Medicare Other | Source: Ambulatory Visit | Attending: Internal Medicine | Admitting: Internal Medicine

## 2011-05-01 DIAGNOSIS — J438 Other emphysema: Secondary | ICD-10-CM | POA: Insufficient documentation

## 2011-05-01 DIAGNOSIS — C3490 Malignant neoplasm of unspecified part of unspecified bronchus or lung: Secondary | ICD-10-CM | POA: Insufficient documentation

## 2011-05-01 DIAGNOSIS — K7689 Other specified diseases of liver: Secondary | ICD-10-CM | POA: Insufficient documentation

## 2011-05-01 DIAGNOSIS — I2510 Atherosclerotic heart disease of native coronary artery without angina pectoris: Secondary | ICD-10-CM | POA: Insufficient documentation

## 2011-05-01 DIAGNOSIS — K769 Liver disease, unspecified: Secondary | ICD-10-CM | POA: Insufficient documentation

## 2011-05-01 DIAGNOSIS — J984 Other disorders of lung: Secondary | ICD-10-CM | POA: Insufficient documentation

## 2011-05-01 LAB — FUNGUS CULTURE W SMEAR

## 2011-05-01 MED ORDER — IOHEXOL 300 MG/ML  SOLN
80.0000 mL | Freq: Once | INTRAMUSCULAR | Status: AC | PRN
  Administered 2011-05-01: 80 mL via INTRAVENOUS

## 2011-05-06 ENCOUNTER — Encounter (HOSPITAL_BASED_OUTPATIENT_CLINIC_OR_DEPARTMENT_OTHER): Payer: Medicare Other | Admitting: Internal Medicine

## 2011-05-06 ENCOUNTER — Other Ambulatory Visit: Payer: Self-pay | Admitting: Internal Medicine

## 2011-05-06 DIAGNOSIS — C3410 Malignant neoplasm of upper lobe, unspecified bronchus or lung: Secondary | ICD-10-CM

## 2011-05-06 LAB — UA PROTEIN, DIPSTICK - CHCC: Protein, Urine: NEGATIVE mg/dL

## 2011-05-06 NOTE — Op Note (Signed)
  NAME:  [name], [name]              ACCOUNT NO.:  [id number]  MEDICAL RECORD NO.:  [id number]  LOCATION:  [facility]                         FACILITY:  [facility]  PHYSICIAN:  Ines Bloomer, M.D. DATE OF BIRTH:  1941-05-14  DATE OF PROCEDURE:  04/30/2011 DATE OF DISCHARGE:                              OPERATIVE REPORT   PREOPERATIVE DIAGNOSIS:  Non-small cell lung cancer, right lower lobe.  POSTOPERATIVE DIAGNOSIS:  Non-small cell lung cancer, right lower lobe.  OPERATION PERFORMED:  Insertion of right IJ Port-A-Cath.  SURGEON:  Ines Bloomer, MD  General anesthesia.  After percutaneous insertion of all monitoring lines, the patient underwent general anesthesia.  She was prepped and draped in the usual sterile manner and underwent local anesthesia with IV sedation, was prepped and draped in usual sterile manner.  An area was infiltrated with 1% Xylocaine at the right supraclavicular area between its clavicular and the sternal heads of the sternocleidomastoid.  The right IJ puncture was performed and a guidewire threaded into the right atrium under fluoro guidance.  The needle was removed.  A stab wound was made around the guidewire.  The area was infiltrated with 1% Xylocaine inferior to this and a transverse incision was made with 11 blade, and we dissected out the pocket for the Port-A-Cath.  The 9.6 preattached Port-A-Cath was inserted into the pocket, sutured in place with a 2-0 silk.  The tubing was then tunneled from the pocket to the stab wound around the guidewire and measured appropriate to reach the right atrial SVC junction.  It was cut appropriately, over the guidewire was passed a dilator and a peel-away sheath, and through the peel-away sheath was passed the tubing.  Peel-away sheath was removed.  The Port-A-Cath by fluoro appeared to be in good position.  It flushed easily and withdrew blood easily.  The wounds were closed with 3-0 Vicryl and Dermabond was applied  to the skin.  A dry sterile dressing was applied.  The patient was returned to the recovery room in stable condition.     Ines Bloomer, M.D.     DPB/MEDQ  D:  04/30/2011  T:  04/30/2011  Job:  [id number]  Electronically Signed by Jovita Gamma M.D. on 05/06/2011 02:41:48 PM

## 2011-05-07 ENCOUNTER — Encounter (HOSPITAL_BASED_OUTPATIENT_CLINIC_OR_DEPARTMENT_OTHER): Payer: Medicare Other | Admitting: Internal Medicine

## 2011-05-07 ENCOUNTER — Ambulatory Visit: Payer: Medicare Other | Admitting: Thoracic Surgery

## 2011-05-07 DIAGNOSIS — Z5111 Encounter for antineoplastic chemotherapy: Secondary | ICD-10-CM

## 2011-05-07 DIAGNOSIS — Z5112 Encounter for antineoplastic immunotherapy: Secondary | ICD-10-CM

## 2011-05-07 DIAGNOSIS — C3410 Malignant neoplasm of upper lobe, unspecified bronchus or lung: Secondary | ICD-10-CM

## 2011-05-14 ENCOUNTER — Encounter (HOSPITAL_BASED_OUTPATIENT_CLINIC_OR_DEPARTMENT_OTHER): Payer: Medicare Other | Admitting: Internal Medicine

## 2011-05-14 ENCOUNTER — Ambulatory Visit (INDEPENDENT_AMBULATORY_CARE_PROVIDER_SITE_OTHER): Payer: Medicare Other | Admitting: Thoracic Surgery

## 2011-05-14 ENCOUNTER — Other Ambulatory Visit: Payer: Self-pay | Admitting: Internal Medicine

## 2011-05-14 DIAGNOSIS — C3410 Malignant neoplasm of upper lobe, unspecified bronchus or lung: Secondary | ICD-10-CM

## 2011-05-14 DIAGNOSIS — C3490 Malignant neoplasm of unspecified part of unspecified bronchus or lung: Secondary | ICD-10-CM

## 2011-05-14 LAB — CBC WITH DIFFERENTIAL/PLATELET
Basophils Absolute: 0 10*3/uL (ref 0.0–0.1)
Eosinophils Absolute: 0.2 10*3/uL (ref 0.0–0.5)
HCT: 36.1 % (ref 34.8–46.6)
HGB: 12.2 g/dL (ref 11.6–15.9)
MCH: 31.1 pg (ref 25.1–34.0)
MONO#: 0.1 10*3/uL (ref 0.1–0.9)
NEUT#: 1.8 10*3/uL (ref 1.5–6.5)
NEUT%: 57.4 % (ref 38.4–76.8)
lymph#: 1.1 10*3/uL (ref 0.9–3.3)

## 2011-05-14 LAB — COMPREHENSIVE METABOLIC PANEL
Albumin: 4.3 g/dL (ref 3.5–5.2)
BUN: 8 mg/dL (ref 6–23)
CO2: 23 mEq/L (ref 19–32)
Calcium: 9.5 mg/dL (ref 8.4–10.5)
Chloride: 104 mEq/L (ref 96–112)
Creatinine, Ser: 0.97 mg/dL (ref 0.50–1.10)
Glucose, Bld: 100 mg/dL — ABNORMAL HIGH (ref 70–99)

## 2011-05-15 NOTE — Assessment & Plan Note (Signed)
OFFICE VISIT  [name], LUTZ DOB:  08-21-1941                                        May 14, 2011 CHART #:  [id number]  The patient came today after using a Port-A-Cath, and she is tolerating the chemotherapy well.  The incisions are well healed, and she is doing well overall.  I will see her back again after she completes in the future.  I appreciate the opportunity of seeing the patient.  Ines Bloomer, M.D. Electronically Signed  DPB/MEDQ  D:  05/14/2011  T:  05/15/2011  Job:  [id number]  cc:   Lajuana Matte, M.D.

## 2011-05-16 LAB — AFB CULTURE WITH SMEAR (NOT AT ARMC): Acid Fast Smear: NONE SEEN

## 2011-05-21 ENCOUNTER — Other Ambulatory Visit: Payer: Self-pay | Admitting: Internal Medicine

## 2011-05-21 ENCOUNTER — Encounter: Payer: Medicare Other | Admitting: Internal Medicine

## 2011-05-21 DIAGNOSIS — C3410 Malignant neoplasm of upper lobe, unspecified bronchus or lung: Secondary | ICD-10-CM

## 2011-05-21 LAB — CBC WITH DIFFERENTIAL/PLATELET
BASO%: 1.1 % (ref 0.0–2.0)
EOS%: 1.9 % (ref 0.0–7.0)
LYMPH%: 63.7 % — ABNORMAL HIGH (ref 14.0–49.7)
MCH: 29.5 pg (ref 25.1–34.0)
MCHC: 32.3 g/dL (ref 31.5–36.0)
MONO#: 0.9 10*3/uL (ref 0.1–0.9)
NEUT%: 10.2 % — ABNORMAL LOW (ref 38.4–76.8)
Platelets: 202 10*3/uL (ref 145–400)
RBC: 4.13 10*6/uL (ref 3.70–5.45)
WBC: 3.8 10*3/uL — ABNORMAL LOW (ref 3.9–10.3)
lymph#: 2.4 10*3/uL (ref 0.9–3.3)

## 2011-05-22 LAB — COMPREHENSIVE METABOLIC PANEL
ALT: 11 U/L (ref 0–35)
AST: 12 U/L (ref 0–37)
CO2: 23 mEq/L (ref 19–32)
Calcium: 9.8 mg/dL (ref 8.4–10.5)
Chloride: 106 mEq/L (ref 96–112)
Sodium: 139 mEq/L (ref 135–145)
Total Protein: 6.7 g/dL (ref 6.0–8.3)

## 2011-05-28 ENCOUNTER — Encounter (HOSPITAL_BASED_OUTPATIENT_CLINIC_OR_DEPARTMENT_OTHER): Payer: Medicare Other | Admitting: Internal Medicine

## 2011-05-28 ENCOUNTER — Other Ambulatory Visit: Payer: Self-pay | Admitting: Internal Medicine

## 2011-05-28 DIAGNOSIS — C3490 Malignant neoplasm of unspecified part of unspecified bronchus or lung: Secondary | ICD-10-CM

## 2011-05-28 DIAGNOSIS — Z5111 Encounter for antineoplastic chemotherapy: Secondary | ICD-10-CM

## 2011-05-28 DIAGNOSIS — C3410 Malignant neoplasm of upper lobe, unspecified bronchus or lung: Secondary | ICD-10-CM

## 2011-05-28 LAB — CBC WITH DIFFERENTIAL/PLATELET
BASO%: 0.4 % (ref 0.0–2.0)
Basophils Absolute: 0 10*3/uL (ref 0.0–0.1)
EOS%: 2.3 % (ref 0.0–7.0)
HGB: 12.3 g/dL (ref 11.6–15.9)
MCH: 31 pg (ref 25.1–34.0)
MCV: 91.5 fL (ref 79.5–101.0)
MONO%: 7.9 % (ref 0.0–14.0)
RBC: 3.96 10*6/uL (ref 3.70–5.45)
RDW: 14.3 % (ref 11.2–14.5)
lymph#: 2.3 10*3/uL (ref 0.9–3.3)

## 2011-05-28 LAB — COMPREHENSIVE METABOLIC PANEL
ALT: 13 U/L (ref 0–35)
AST: 15 U/L (ref 0–37)
Albumin: 3.6 g/dL (ref 3.5–5.2)
Alkaline Phosphatase: 77 U/L (ref 39–117)
Calcium: 10.1 mg/dL (ref 8.4–10.5)
Chloride: 106 mEq/L (ref 96–112)
Potassium: 4.2 mEq/L (ref 3.5–5.3)
Sodium: 140 mEq/L (ref 135–145)
Total Protein: 7.2 g/dL (ref 6.0–8.3)

## 2011-05-28 LAB — UA PROTEIN, DIPSTICK - CHCC: Protein, Urine: NEGATIVE mg/dL

## 2011-06-04 ENCOUNTER — Encounter (HOSPITAL_BASED_OUTPATIENT_CLINIC_OR_DEPARTMENT_OTHER): Payer: Medicare Other | Admitting: Internal Medicine

## 2011-06-04 ENCOUNTER — Other Ambulatory Visit: Payer: Self-pay | Admitting: Internal Medicine

## 2011-06-04 DIAGNOSIS — C3410 Malignant neoplasm of upper lobe, unspecified bronchus or lung: Secondary | ICD-10-CM

## 2011-06-04 LAB — CBC WITH DIFFERENTIAL/PLATELET
Basophils Absolute: 0 10*3/uL (ref 0.0–0.1)
Eosinophils Absolute: 0.3 10*3/uL (ref 0.0–0.5)
HCT: 36 % (ref 34.8–46.6)
HGB: 12.4 g/dL (ref 11.6–15.9)
LYMPH%: 35.4 % (ref 14.0–49.7)
MCV: 90.5 fL (ref 79.5–101.0)
MONO%: 1.9 % (ref 0.0–14.0)
NEUT#: 2.2 10*3/uL (ref 1.5–6.5)
Platelets: 115 10*3/uL — ABNORMAL LOW (ref 145–400)
RDW: 14 % (ref 11.2–14.5)

## 2011-06-04 LAB — COMPREHENSIVE METABOLIC PANEL
Albumin: 3.7 g/dL (ref 3.5–5.2)
Alkaline Phosphatase: 69 U/L (ref 39–117)
BUN: 8 mg/dL (ref 6–23)
Glucose, Bld: 86 mg/dL (ref 70–99)
Potassium: 4.4 mEq/L (ref 3.5–5.3)

## 2011-06-12 ENCOUNTER — Encounter (HOSPITAL_BASED_OUTPATIENT_CLINIC_OR_DEPARTMENT_OTHER): Payer: Medicare Other | Admitting: Internal Medicine

## 2011-06-12 ENCOUNTER — Other Ambulatory Visit: Payer: Self-pay | Admitting: Internal Medicine

## 2011-06-12 ENCOUNTER — Ambulatory Visit (HOSPITAL_COMMUNITY)
Admission: RE | Admit: 2011-06-12 | Discharge: 2011-06-12 | Disposition: A | Discharge status: Home / Self Care | Payer: Medicare Other | Source: Ambulatory Visit | Attending: Internal Medicine | Admitting: Internal Medicine

## 2011-06-12 DIAGNOSIS — I700 Atherosclerosis of aorta: Secondary | ICD-10-CM | POA: Insufficient documentation

## 2011-06-12 DIAGNOSIS — C3410 Malignant neoplasm of upper lobe, unspecified bronchus or lung: Secondary | ICD-10-CM | POA: Insufficient documentation

## 2011-06-12 DIAGNOSIS — J984 Other disorders of lung: Secondary | ICD-10-CM | POA: Insufficient documentation

## 2011-06-12 DIAGNOSIS — D7389 Other diseases of spleen: Secondary | ICD-10-CM | POA: Insufficient documentation

## 2011-06-12 DIAGNOSIS — C3490 Malignant neoplasm of unspecified part of unspecified bronchus or lung: Secondary | ICD-10-CM

## 2011-06-12 LAB — CBC WITH DIFFERENTIAL/PLATELET
Basophils Absolute: 0 10*3/uL (ref 0.0–0.1)
Eosinophils Absolute: 0 10*3/uL (ref 0.0–0.5)
HGB: 11.7 g/dL (ref 11.6–15.9)
MCV: 90.4 fL (ref 79.5–101.0)
MONO#: 0.7 10*3/uL (ref 0.1–0.9)
MONO%: 23.5 % — ABNORMAL HIGH (ref 0.0–14.0)
NEUT#: 0.3 10*3/uL — CL (ref 1.5–6.5)
RDW: 14.8 % — ABNORMAL HIGH (ref 11.2–14.5)
lymph#: 1.9 10*3/uL (ref 0.9–3.3)

## 2011-06-12 LAB — COMPREHENSIVE METABOLIC PANEL
Albumin: 4.3 g/dL (ref 3.5–5.2)
BUN: 7 mg/dL (ref 6–23)
CO2: 24 mEq/L (ref 19–32)
Calcium: 9.8 mg/dL (ref 8.4–10.5)
Chloride: 105 mEq/L (ref 96–112)
Glucose, Bld: 91 mg/dL (ref 70–99)
Potassium: 4 mEq/L (ref 3.5–5.3)

## 2011-06-12 MED ORDER — IOHEXOL 300 MG/ML  SOLN: 80.0000 mL | Freq: Once | INTRAMUSCULAR | Status: AC | PRN

## 2011-06-18 ENCOUNTER — Other Ambulatory Visit: Payer: Self-pay | Admitting: Internal Medicine

## 2011-06-18 ENCOUNTER — Encounter (HOSPITAL_BASED_OUTPATIENT_CLINIC_OR_DEPARTMENT_OTHER): Payer: Medicare Other | Admitting: Internal Medicine

## 2011-06-18 DIAGNOSIS — G589 Mononeuropathy, unspecified: Secondary | ICD-10-CM

## 2011-06-18 DIAGNOSIS — C3410 Malignant neoplasm of upper lobe, unspecified bronchus or lung: Secondary | ICD-10-CM

## 2011-06-18 DIAGNOSIS — Z5111 Encounter for antineoplastic chemotherapy: Secondary | ICD-10-CM

## 2011-06-18 DIAGNOSIS — Z5112 Encounter for antineoplastic immunotherapy: Secondary | ICD-10-CM

## 2011-06-18 LAB — CBC WITH DIFFERENTIAL/PLATELET
BASO%: 0.5 % (ref 0.0–2.0)
EOS%: 0.9 % (ref 0.0–7.0)
HCT: 35 % (ref 34.8–46.6)
MCH: 30.7 pg (ref 25.1–34.0)
MCHC: 33.5 g/dL (ref 31.5–36.0)
MONO%: 10.1 % (ref 0.0–14.0)
NEUT%: 62.7 % (ref 38.4–76.8)
RDW: 14.9 % — ABNORMAL HIGH (ref 11.2–14.5)
lymph#: 2 10*3/uL (ref 0.9–3.3)

## 2011-06-25 ENCOUNTER — Other Ambulatory Visit: Payer: Self-pay | Admitting: Internal Medicine

## 2011-06-25 ENCOUNTER — Encounter (HOSPITAL_BASED_OUTPATIENT_CLINIC_OR_DEPARTMENT_OTHER): Payer: Medicare Other | Admitting: Internal Medicine

## 2011-06-25 DIAGNOSIS — Z5112 Encounter for antineoplastic immunotherapy: Secondary | ICD-10-CM

## 2011-06-25 DIAGNOSIS — Z5111 Encounter for antineoplastic chemotherapy: Secondary | ICD-10-CM

## 2011-06-25 DIAGNOSIS — C3410 Malignant neoplasm of upper lobe, unspecified bronchus or lung: Secondary | ICD-10-CM

## 2011-06-25 LAB — CBC WITH DIFFERENTIAL/PLATELET
BASO%: 0.4 % (ref 0.0–2.0)
EOS%: 2.3 % (ref 0.0–7.0)
Eosinophils Absolute: 0.2 10*3/uL (ref 0.0–0.5)
MCHC: 33.7 g/dL (ref 31.5–36.0)
MCV: 90.9 fL (ref 79.5–101.0)
MONO%: 7.4 % (ref 0.0–14.0)
NEUT#: 5.1 10*3/uL (ref 1.5–6.5)
RBC: 3.89 10*6/uL (ref 3.70–5.45)
RDW: 15.4 % — ABNORMAL HIGH (ref 11.2–14.5)

## 2011-06-25 LAB — COMPREHENSIVE METABOLIC PANEL
ALT: 12 U/L (ref 0–35)
AST: 12 U/L (ref 0–37)
Alkaline Phosphatase: 69 U/L (ref 39–117)
Creatinine, Ser: 0.92 mg/dL (ref 0.50–1.10)
Sodium: 139 mEq/L (ref 135–145)
Total Bilirubin: 0.3 mg/dL (ref 0.3–1.2)
Total Protein: 7.3 g/dL (ref 6.0–8.3)

## 2011-06-25 LAB — UA PROTEIN, DIPSTICK - CHCC: Protein, Urine: NEGATIVE mg/dL

## 2011-06-25 LAB — LACTATE DEHYDROGENASE: LDH: 196 U/L (ref 94–250)

## 2011-07-02 ENCOUNTER — Encounter: Payer: Medicare Other | Admitting: Internal Medicine

## 2011-07-02 ENCOUNTER — Other Ambulatory Visit: Payer: Self-pay | Admitting: Internal Medicine

## 2011-07-02 LAB — COMPREHENSIVE METABOLIC PANEL
ALT: 12 U/L (ref 0–35)
Albumin: 3.6 g/dL (ref 3.5–5.2)
CO2: 27 mEq/L (ref 19–32)
Calcium: 10.1 mg/dL (ref 8.4–10.5)
Chloride: 100 mEq/L (ref 96–112)
Creatinine, Ser: 0.77 mg/dL (ref 0.50–1.10)
Potassium: 4.2 mEq/L (ref 3.5–5.3)
Sodium: 135 mEq/L (ref 135–145)
Total Protein: 7.2 g/dL (ref 6.0–8.3)

## 2011-07-02 LAB — LACTATE DEHYDROGENASE: LDH: 205 U/L (ref 94–250)

## 2011-07-09 ENCOUNTER — Other Ambulatory Visit: Payer: Self-pay | Admitting: Internal Medicine

## 2011-07-09 ENCOUNTER — Encounter (HOSPITAL_BASED_OUTPATIENT_CLINIC_OR_DEPARTMENT_OTHER): Payer: Medicare Other | Admitting: Internal Medicine

## 2011-07-09 DIAGNOSIS — Z5111 Encounter for antineoplastic chemotherapy: Secondary | ICD-10-CM

## 2011-07-09 DIAGNOSIS — Z5112 Encounter for antineoplastic immunotherapy: Secondary | ICD-10-CM

## 2011-07-09 DIAGNOSIS — C3410 Malignant neoplasm of upper lobe, unspecified bronchus or lung: Secondary | ICD-10-CM

## 2011-07-09 LAB — CBC WITH DIFFERENTIAL/PLATELET
EOS%: 1.3 % (ref 0.0–7.0)
LYMPH%: 65 % — ABNORMAL HIGH (ref 14.0–49.7)
MCH: 30.4 pg (ref 25.1–34.0)
MCHC: 33.5 g/dL (ref 31.5–36.0)
MCV: 90.6 fL (ref 79.5–101.0)
MONO%: 24.5 % — ABNORMAL HIGH (ref 0.0–14.0)
Platelets: 129 10*3/uL — ABNORMAL LOW (ref 145–400)
RBC: 3.82 10*6/uL (ref 3.70–5.45)
RDW: 15.2 % — ABNORMAL HIGH (ref 11.2–14.5)

## 2011-07-09 LAB — COMPREHENSIVE METABOLIC PANEL
ALT: 9 U/L (ref 0–35)
AST: 11 U/L (ref 0–37)
Albumin: 4.2 g/dL (ref 3.5–5.2)
Alkaline Phosphatase: 59 U/L (ref 39–117)
Calcium: 9.6 mg/dL (ref 8.4–10.5)
Chloride: 105 mEq/L (ref 96–112)
Potassium: 3.9 mEq/L (ref 3.5–5.3)
Sodium: 138 mEq/L (ref 135–145)
Total Protein: 6.9 g/dL (ref 6.0–8.3)

## 2011-07-09 LAB — UA PROTEIN, DIPSTICK - CHCC: Protein, Urine: NEGATIVE mg/dL

## 2011-07-16 ENCOUNTER — Encounter (HOSPITAL_BASED_OUTPATIENT_CLINIC_OR_DEPARTMENT_OTHER): Payer: Medicare Other | Admitting: Internal Medicine

## 2011-07-16 ENCOUNTER — Other Ambulatory Visit: Payer: Self-pay | Admitting: Internal Medicine

## 2011-07-16 DIAGNOSIS — C3410 Malignant neoplasm of upper lobe, unspecified bronchus or lung: Secondary | ICD-10-CM

## 2011-07-16 DIAGNOSIS — Z5112 Encounter for antineoplastic immunotherapy: Secondary | ICD-10-CM

## 2011-07-16 DIAGNOSIS — C3490 Malignant neoplasm of unspecified part of unspecified bronchus or lung: Secondary | ICD-10-CM

## 2011-07-16 DIAGNOSIS — Z5111 Encounter for antineoplastic chemotherapy: Secondary | ICD-10-CM

## 2011-07-16 LAB — COMPREHENSIVE METABOLIC PANEL
AST: 12 U/L (ref 0–37)
Alkaline Phosphatase: 68 U/L (ref 39–117)
BUN: 8 mg/dL (ref 6–23)
Glucose, Bld: 115 mg/dL — ABNORMAL HIGH (ref 70–99)
Potassium: 3.9 mEq/L (ref 3.5–5.3)
Sodium: 139 mEq/L (ref 135–145)
Total Bilirubin: 0.3 mg/dL (ref 0.3–1.2)
Total Protein: 7.4 g/dL (ref 6.0–8.3)

## 2011-07-16 LAB — CBC WITH DIFFERENTIAL/PLATELET
BASO%: 0.4 % (ref 0.0–2.0)
Basophils Absolute: 0 10*3/uL (ref 0.0–0.1)
EOS%: 1 % (ref 0.0–7.0)
HGB: 12 g/dL (ref 11.6–15.9)
MCH: 31 pg (ref 25.1–34.0)
MCHC: 33.6 g/dL (ref 31.5–36.0)
MCV: 92.2 fL (ref 79.5–101.0)
MONO%: 9.9 % (ref 0.0–14.0)
RDW: 15.8 % — ABNORMAL HIGH (ref 11.2–14.5)
lymph#: 2 10*3/uL (ref 0.9–3.3)

## 2011-07-16 LAB — MAGNESIUM: Magnesium: 2.1 mg/dL (ref 1.5–2.5)

## 2011-07-26 ENCOUNTER — Other Ambulatory Visit: Payer: Self-pay | Admitting: Internal Medicine

## 2011-07-26 ENCOUNTER — Encounter (HOSPITAL_BASED_OUTPATIENT_CLINIC_OR_DEPARTMENT_OTHER): Payer: Medicare Other | Admitting: Internal Medicine

## 2011-07-26 DIAGNOSIS — Z5112 Encounter for antineoplastic immunotherapy: Secondary | ICD-10-CM

## 2011-07-26 DIAGNOSIS — Z5111 Encounter for antineoplastic chemotherapy: Secondary | ICD-10-CM

## 2011-07-26 DIAGNOSIS — C3410 Malignant neoplasm of upper lobe, unspecified bronchus or lung: Secondary | ICD-10-CM

## 2011-07-26 LAB — CBC WITH DIFFERENTIAL/PLATELET
Basophils Absolute: 0 10*3/uL (ref 0.0–0.1)
EOS%: 1 % (ref 0.0–7.0)
Eosinophils Absolute: 0 10*3/uL (ref 0.0–0.5)
HGB: 11.5 g/dL — ABNORMAL LOW (ref 11.6–15.9)
MONO#: 0.3 10*3/uL (ref 0.1–0.9)
NEUT#: 1.3 10*3/uL — ABNORMAL LOW (ref 1.5–6.5)
RDW: 15.6 % — ABNORMAL HIGH (ref 11.2–14.5)
WBC: 3.5 10*3/uL — ABNORMAL LOW (ref 3.9–10.3)
lymph#: 1.8 10*3/uL (ref 0.9–3.3)

## 2011-07-26 LAB — COMPREHENSIVE METABOLIC PANEL
AST: 18 U/L (ref 0–37)
Albumin: 4.3 g/dL (ref 3.5–5.2)
BUN: 8 mg/dL (ref 6–23)
Calcium: 10 mg/dL (ref 8.4–10.5)
Chloride: 100 mEq/L (ref 96–112)
Glucose, Bld: 86 mg/dL (ref 70–99)
Potassium: 4.1 mEq/L (ref 3.5–5.3)

## 2011-08-01 ENCOUNTER — Other Ambulatory Visit: Payer: Self-pay | Admitting: Internal Medicine

## 2011-08-01 ENCOUNTER — Ambulatory Visit (HOSPITAL_COMMUNITY)
Admission: RE | Admit: 2011-08-01 | Discharge: 2011-08-01 | Disposition: A | Discharge status: Home / Self Care | Payer: Medicare Other | Source: Ambulatory Visit | Attending: Internal Medicine | Admitting: Internal Medicine

## 2011-08-01 ENCOUNTER — Encounter (HOSPITAL_BASED_OUTPATIENT_CLINIC_OR_DEPARTMENT_OTHER): Payer: Medicare Other | Admitting: Internal Medicine

## 2011-08-01 DIAGNOSIS — C3490 Malignant neoplasm of unspecified part of unspecified bronchus or lung: Secondary | ICD-10-CM

## 2011-08-01 DIAGNOSIS — R911 Solitary pulmonary nodule: Secondary | ICD-10-CM | POA: Insufficient documentation

## 2011-08-01 DIAGNOSIS — C3410 Malignant neoplasm of upper lobe, unspecified bronchus or lung: Secondary | ICD-10-CM

## 2011-08-01 LAB — CBC WITH DIFFERENTIAL/PLATELET
Basophils Absolute: 0 10*3/uL (ref 0.0–0.1)
EOS%: 0.4 % (ref 0.0–7.0)
HGB: 10.5 g/dL — ABNORMAL LOW (ref 11.6–15.9)
MCH: 30 pg (ref 25.1–34.0)
MCHC: 32.8 g/dL (ref 31.5–36.0)
MCV: 91.4 fL (ref 79.5–101.0)
MONO%: 27.7 % — ABNORMAL HIGH (ref 0.0–14.0)
NEUT%: 16.1 % — ABNORMAL LOW (ref 38.4–76.8)
RDW: 15.6 % — ABNORMAL HIGH (ref 11.2–14.5)

## 2011-08-01 LAB — COMPREHENSIVE METABOLIC PANEL
Alkaline Phosphatase: 55 U/L (ref 39–117)
BUN: 13 mg/dL (ref 6–23)
Glucose, Bld: 84 mg/dL (ref 70–99)
Sodium: 136 mEq/L (ref 135–145)
Total Bilirubin: 0.3 mg/dL (ref 0.3–1.2)

## 2011-08-01 MED ORDER — IOHEXOL 300 MG/ML  SOLN
80.0000 mL | Freq: Once | INTRAMUSCULAR | Status: AC | PRN
  Administered 2011-08-01: 80 mL via INTRAVENOUS

## 2011-08-05 ENCOUNTER — Other Ambulatory Visit: Payer: Self-pay | Admitting: Internal Medicine

## 2011-08-05 ENCOUNTER — Encounter (HOSPITAL_BASED_OUTPATIENT_CLINIC_OR_DEPARTMENT_OTHER): Payer: Medicare Other | Admitting: Internal Medicine

## 2011-08-05 DIAGNOSIS — Z23 Encounter for immunization: Secondary | ICD-10-CM

## 2011-08-05 DIAGNOSIS — Z5111 Encounter for antineoplastic chemotherapy: Secondary | ICD-10-CM

## 2011-08-05 DIAGNOSIS — M7989 Other specified soft tissue disorders: Secondary | ICD-10-CM

## 2011-08-05 DIAGNOSIS — Z5112 Encounter for antineoplastic immunotherapy: Secondary | ICD-10-CM

## 2011-08-05 DIAGNOSIS — C3410 Malignant neoplasm of upper lobe, unspecified bronchus or lung: Secondary | ICD-10-CM

## 2011-08-05 LAB — LACTATE DEHYDROGENASE: LDH: 210 U/L (ref 94–250)

## 2011-08-05 LAB — UA PROTEIN, DIPSTICK - CHCC: Protein, Urine: NEGATIVE mg/dL

## 2011-08-05 LAB — COMPREHENSIVE METABOLIC PANEL
Albumin: 3.7 g/dL (ref 3.5–5.2)
Alkaline Phosphatase: 66 U/L (ref 39–117)
CO2: 25 mEq/L (ref 19–32)
Glucose, Bld: 104 mg/dL — ABNORMAL HIGH (ref 70–99)
Potassium: 3.8 mEq/L (ref 3.5–5.3)
Sodium: 139 mEq/L (ref 135–145)
Total Protein: 7 g/dL (ref 6.0–8.3)

## 2011-08-05 LAB — CBC WITH DIFFERENTIAL/PLATELET
Basophils Absolute: 0 10*3/uL (ref 0.0–0.1)
Eosinophils Absolute: 0 10*3/uL (ref 0.0–0.5)
HGB: 11.1 g/dL — ABNORMAL LOW (ref 11.6–15.9)
LYMPH%: 39 % (ref 14.0–49.7)
MCV: 92.3 fL (ref 79.5–101.0)
MONO%: 10.6 % (ref 0.0–14.0)
NEUT#: 2.6 10*3/uL (ref 1.5–6.5)
Platelets: 176 10*3/uL (ref 145–400)

## 2011-08-05 LAB — MAGNESIUM: Magnesium: 1.8 mg/dL (ref 1.5–2.5)

## 2011-08-05 LAB — APTT: aPTT: 34 s (ref 24–37)

## 2011-08-10 ENCOUNTER — Encounter: Payer: Self-pay | Admitting: *Deleted

## 2011-08-12 ENCOUNTER — Other Ambulatory Visit: Payer: Self-pay | Admitting: Internal Medicine

## 2011-08-12 ENCOUNTER — Encounter (HOSPITAL_BASED_OUTPATIENT_CLINIC_OR_DEPARTMENT_OTHER): Payer: Medicare Other | Admitting: Internal Medicine

## 2011-08-12 DIAGNOSIS — Z5112 Encounter for antineoplastic immunotherapy: Secondary | ICD-10-CM

## 2011-08-12 DIAGNOSIS — Z5111 Encounter for antineoplastic chemotherapy: Secondary | ICD-10-CM

## 2011-08-12 DIAGNOSIS — C3410 Malignant neoplasm of upper lobe, unspecified bronchus or lung: Secondary | ICD-10-CM

## 2011-08-12 LAB — COMPREHENSIVE METABOLIC PANEL
ALT: 23 U/L (ref 0–35)
AST: 20 U/L (ref 0–37)
Albumin: 4 g/dL (ref 3.5–5.2)
BUN: 6 mg/dL (ref 6–23)
Calcium: 10.8 mg/dL — ABNORMAL HIGH (ref 8.4–10.5)
Chloride: 104 mEq/L (ref 96–112)
Potassium: 3.8 mEq/L (ref 3.5–5.3)
Total Protein: 7.5 g/dL (ref 6.0–8.3)

## 2011-08-12 LAB — CBC WITH DIFFERENTIAL/PLATELET
Basophils Absolute: 0 10*3/uL (ref 0.0–0.1)
EOS%: 1.2 % (ref 0.0–7.0)
HGB: 12 g/dL (ref 11.6–15.9)
LYMPH%: 28.3 % (ref 14.0–49.7)
MCH: 31.1 pg (ref 25.1–34.0)
MCV: 92.4 fL (ref 79.5–101.0)
MONO%: 8.2 % (ref 0.0–14.0)
NEUT%: 62 % (ref 38.4–76.8)
Platelets: 197 10*3/uL (ref 145–400)
RDW: 17.1 % — ABNORMAL HIGH (ref 11.2–14.5)

## 2011-08-12 LAB — LACTATE DEHYDROGENASE: LDH: 248 U/L (ref 94–250)

## 2011-08-15 ENCOUNTER — Other Ambulatory Visit: Payer: Self-pay | Admitting: *Deleted

## 2011-08-19 ENCOUNTER — Other Ambulatory Visit: Payer: Medicare Other

## 2011-08-22 ENCOUNTER — Other Ambulatory Visit: Payer: Self-pay | Admitting: Internal Medicine

## 2011-08-31 ENCOUNTER — Other Ambulatory Visit: Payer: Self-pay | Admitting: Physician Assistant

## 2011-09-02 ENCOUNTER — Telehealth: Payer: Self-pay | Admitting: Internal Medicine

## 2011-09-02 ENCOUNTER — Telehealth: Payer: Self-pay | Admitting: Oncology

## 2011-09-02 ENCOUNTER — Ambulatory Visit (HOSPITAL_BASED_OUTPATIENT_CLINIC_OR_DEPARTMENT_OTHER): Payer: Medicare Other | Admitting: Physician Assistant

## 2011-09-02 ENCOUNTER — Other Ambulatory Visit: Payer: Self-pay | Admitting: Internal Medicine

## 2011-09-02 ENCOUNTER — Ambulatory Visit (HOSPITAL_BASED_OUTPATIENT_CLINIC_OR_DEPARTMENT_OTHER): Payer: Medicare Other

## 2011-09-02 ENCOUNTER — Encounter: Payer: Self-pay | Admitting: Physician Assistant

## 2011-09-02 ENCOUNTER — Other Ambulatory Visit (HOSPITAL_BASED_OUTPATIENT_CLINIC_OR_DEPARTMENT_OTHER): Payer: Medicare Other | Admitting: Lab

## 2011-09-02 ENCOUNTER — Encounter: Payer: Self-pay | Admitting: *Deleted

## 2011-09-02 DIAGNOSIS — Z5112 Encounter for antineoplastic immunotherapy: Secondary | ICD-10-CM

## 2011-09-02 DIAGNOSIS — M79609 Pain in unspecified limb: Secondary | ICD-10-CM

## 2011-09-02 DIAGNOSIS — C3490 Malignant neoplasm of unspecified part of unspecified bronchus or lung: Secondary | ICD-10-CM

## 2011-09-02 DIAGNOSIS — C3410 Malignant neoplasm of upper lobe, unspecified bronchus or lung: Secondary | ICD-10-CM

## 2011-09-02 DIAGNOSIS — M79673 Pain in unspecified foot: Secondary | ICD-10-CM

## 2011-09-02 DIAGNOSIS — M25559 Pain in unspecified hip: Secondary | ICD-10-CM

## 2011-09-02 HISTORY — DX: Pain in unspecified foot: M79.673

## 2011-09-02 HISTORY — DX: Pain in unspecified hip: M25.559

## 2011-09-02 LAB — COMPREHENSIVE METABOLIC PANEL WITH GFR
ALT: 10 U/L (ref 0–35)
AST: 15 U/L (ref 0–37)
Albumin: 3.9 g/dL (ref 3.5–5.2)
Alkaline Phosphatase: 69 U/L (ref 39–117)
BUN: 8 mg/dL (ref 6–23)
CO2: 26 meq/L (ref 19–32)
Calcium: 10.5 mg/dL (ref 8.4–10.5)
Chloride: 102 meq/L (ref 96–112)
Creatinine, Ser: 0.89 mg/dL (ref 0.50–1.10)
Glucose, Bld: 85 mg/dL (ref 70–99)
Potassium: 3.6 meq/L (ref 3.5–5.3)
Sodium: 138 meq/L (ref 135–145)
Total Bilirubin: 0.4 mg/dL (ref 0.3–1.2)
Total Protein: 7.4 g/dL (ref 6.0–8.3)

## 2011-09-02 LAB — CBC WITH DIFFERENTIAL/PLATELET
BASO%: 0.4 % (ref 0.0–2.0)
EOS%: 1.5 % (ref 0.0–7.0)
HGB: 12.1 g/dL (ref 11.6–15.9)
MCH: 31 pg (ref 25.1–34.0)
MCHC: 33.2 g/dL (ref 31.5–36.0)
RBC: 3.89 10*6/uL (ref 3.70–5.45)
RDW: 16.5 % — ABNORMAL HIGH (ref 11.2–14.5)
lymph#: 2 10*3/uL (ref 0.9–3.3)

## 2011-09-02 LAB — LACTATE DEHYDROGENASE: LDH: 195 U/L (ref 94–250)

## 2011-09-02 LAB — UA PROTEIN, DIPSTICK - CHCC: Protein, Urine: NEGATIVE mg/dL

## 2011-09-02 LAB — RESEARCH LABS

## 2011-09-02 MED ORDER — SODIUM CHLORIDE 0.9 % IJ SOLN
10.0000 mL | INTRAMUSCULAR | Status: DC | PRN
  Administered 2011-09-02: 10 mL
  Filled 2011-09-02: qty 10

## 2011-09-02 MED ORDER — PREGABALIN 50 MG PO CAPS: 50.0000 mg | ORAL_CAPSULE | Freq: Three times a day (TID) | ORAL | Status: DC

## 2011-09-02 MED ORDER — SODIUM CHLORIDE 0.9 % IV SOLN
15.0000 mg/kg | Freq: Once | INTRAVENOUS | Status: AC
  Administered 2011-09-02: 950 mg via INTRAVENOUS
  Filled 2011-09-02: qty 38

## 2011-09-02 MED ORDER — HEPARIN SOD (PORK) LOCK FLUSH 100 UNIT/ML IV SOLN
500.0000 [IU] | Freq: Once | INTRAVENOUS | Status: AC | PRN
  Administered 2011-09-02: 500 [IU]
  Filled 2011-09-02: qty 5

## 2011-09-02 MED ORDER — SODIUM CHLORIDE 0.9 % IV SOLN
Freq: Once | INTRAVENOUS | Status: AC
  Administered 2011-09-02: 14:00:00 via INTRAVENOUS

## 2011-09-02 MED ORDER — SODIUM CHLORIDE 0.9 % IV SOLN
Freq: Once | INTRAVENOUS | Status: AC
  Administered 2011-09-02: 15:00:00 via INTRAVENOUS

## 2011-09-02 NOTE — Telephone Encounter (Signed)
Added flush appt for 1/14 @ 830 am per pt.

## 2011-09-02 NOTE — Progress Notes (Signed)
instructed to call for any problems.

## 2011-09-02 NOTE — Telephone Encounter (Signed)
gv pt appt schedule for dec and ct for 12/5 @ 8 am @ [facility].

## 2011-09-02 NOTE — Progress Notes (Signed)
No images are attached to the encounter. No scans are attached to the encounter. No scans are attached to the encounter. [facility] OFFICE PROGRESS NOTE  [name],[name], [name], [name]  DIAGNOSIS: :  Recurrent non-small cell lung cancer consistent with adenocarcinoma.  This was initially diagnosed as stage IB (T2a N0 M0) well-differentiated adenocarcinoma of bronchioalveolar type in December 2010.  PRIOR THERAPY: 1.Status post right upper lobectomy on September 28, 2009 under the care of Dr. Edwyna Shell.  The patient refused adjuvant chemotherapy at that time.  She had disease recurrence in June of 2012.  2.Status post 4 cycles of systemic chemotherapy with carboplatin for an AUC of 6 and paclitaxel at 200 mg per meter squared and Avastin at 15 mg/kg according to the ECOG protocol #5508.  CURRENT THERAPY: Chemotherapy with Avastin 15 mg/kg according to the ECOG protocol #5508  INTERVAL HISTORY: [name] 70 y.o. female returns for a scheduled regular office visit for followup of her recurrent non-small cell lung cancer. She is currently receiving Avastin according ti the ECOG protocol #5508. Thus far she has tolerated this therapy without difficulty. She has had no problems with bleeding or bruising. Her primary complaints today are that of increased foot pain and left hip pain, impairing her ability to ambulate. She is accompanied by her daughter Asencion Islam today. Ms. Ruppe has had long standing dry skin and calluses affecting her feet  That dates back to her teens. As a result of her adaptive ambulation, she has had increased left hip pain. She is currently not taking her narcotic pain medication as she does not like the side effects. She reports shortness of breath with exertion  MEDICAL HISTORY: Past Medical History  Diagnosis Date  . Lung cancer 03/08/2011    recurrent  . Hypertension   . Hypothyroidism   . Hypercholesterolemia   . Glaucoma   . Hip pain  09/02/2011  . Foot pain 09/02/2011    ALLERGIES:  is allergic to amitriptyline; codeine; and sulfa antibiotics.  MEDICATIONS:  Current Outpatient Prescriptions  Medication Sig Dispense Refill  . amLODipine (NORVASC) 5 MG tablet Take 5 mg by mouth daily. Per [name], NP       . citalopram (CELEXA) 40 MG tablet Take 40 mg by mouth daily.        . clonazePAM (KLONOPIN) 0.5 MG tablet Take 0.5 mg by mouth 2 (two) times daily. Per [name], NP       . dorzolamide-timolol (COSOPT) 22.3-6.8 MG/ML ophthalmic solution 2 drops 2 (two) times daily. Per [name], NP       . HYDROcodone-acetaminophen (VICODIN) 5-500 MG per tablet       . levothyroxine (SYNTHROID, LEVOTHROID) 50 MCG tablet Take 50 mcg by mouth 4 (four) times a week. Per [name], NP       . levothyroxine (SYNTHROID, LEVOTHROID) 75 MCG tablet Take 75 mcg by mouth 3 (three) times a week. Per [name], NP       . lidocaine-prilocaine (EMLA) cream Apply 1 application topically as needed.        Marland Kitchen lisinopril (PRINIVIL,ZESTRIL) 40 MG tablet Take 40 mg by mouth daily. Per [name], NP       . prochlorperazine (COMPAZINE) 10 MG tablet Take 10 mg by mouth every 6 (six) hours as needed.        . rosuvastatin (CRESTOR) 10 MG tablet Take 10 mg by mouth daily.        . timolol (  TIMOPTIC) 0.5 % ophthalmic solution       . gabapentin (NEURONTIN) 100 MG capsule TAKE 1 CAPSULE BY MOUTH THREE TIMES DAILY  90 capsule  1  . pregabalin (LYRICA) 50 MG capsule Take 1 capsule (50 mg total) by mouth 3 (three) times daily.  90 capsule  2   No current facility-administered medications for this visit.   Facility-Administered Medications Ordered in Other Visits  Medication Dose Route Frequency Provider Last Rate Last Dose  . 0.9 %  sodium chloride infusion   Intravenous Once Mohamed K. Mohamed, MD      . 0.9 %  sodium chloride infusion   Intravenous Once Mohamed K. Mohamed, MD      . bevacizumab (AVASTIN) 950 mg in sodium chloride  0.9 % 100 mL chemo infusion  15 mg/kg (Order-Specific) Intravenous Once Mohamed K. Mohamed, MD   950 mg at 09/02/11 1457  . heparin lock flush 100 unit/mL  500 Units Intracatheter Once PRN Mohamed K. Arbutus Ped, MD   500 Units at 09/02/11 1555  . sodium chloride 0.9 % injection 10 mL  10 mL Intracatheter PRN Mohamed K. Mohamed, MD   10 mL at 09/02/11 1553    SURGICAL HISTORY:  Past Surgical History  Procedure Date  . Video assisted thoracoscopy 09/28/2009  . Thoracotomy 09/28/2009    mini  . Lung lobectomy 09/28/2009    right upper    REVIEW OF SYSTEMS:  A comprehensive review of systems was negative except for: Respiratory: positive for dyspnea on exertion Musculoskeletal: positive for bone pain and stiff joints peripheral neruopathy involving hands and feet as well as increased paine and new ? blisterin of the feet   PHYSICAL EXAMINATION: General appearance: alert, cooperative and mild distress Head: Normocephalic, without obvious abnormality, atraumatic Neck: no adenopathy, no carotid bruit, no JVD, supple, symmetrical, trachea midline and thyroid not enlarged, symmetric, no tenderness/mass/nodules Lymph nodes: Cervical, supraclavicular, and axillary nodes normal. Resp: clear to auscultation bilaterally Cardio: regular rate and rhythm, S1, S2 normal, no murmur, click, rub or gallop GI: soft, non-tender; bowel sounds normal; no masses,  no organomegaly Extremities: edema ,trace pitting edema, bilateral lower extremities. skin on feet generally dry and flaking,  and approximately 1 cm blister-like lesion on left heel  ECOG PERFORMANCE STATUS: 1 - Symptomatic but completely ambulatory  Blood pressure 130/73, pulse 81, temperature 96.9 F (36.1 C), height 5\' 5"  (1.651 m), weight 59.421 kg (131 lb).  LABORATORY DATA: Lab Results  Component Value Date   WBC 6.6 09/02/2011   HGB 12.1 09/02/2011   HCT 36.3 09/02/2011   MCV 93.4 09/02/2011   PLT 143* 09/02/2011      Chemistry        Component Value Date/Time   NA 138 09/02/2011 1114   NA 142 03/08/2011 0754   K 3.6 09/02/2011 1114   K 4.2 03/08/2011 0754   CL 102 09/02/2011 1114   CL 102 03/08/2011 0754   CO2 26 09/02/2011 1114   CO2 27 03/08/2011 0754   BUN 8 09/02/2011 1114   BUN 9 03/08/2011 0754   CREATININE 0.89 09/02/2011 1114   CREATININE 1.0 03/08/2011 0754      Component Value Date/Time   CALCIUM 10.5 09/02/2011 1114   CALCIUM 9.5 03/08/2011 0754   ALKPHOS 69 09/02/2011 1114   ALKPHOS 59 03/08/2011 0754   AST 15 09/02/2011 1114   AST 19 03/08/2011 0754   ALT 10 09/02/2011 1114   BILITOT 0.4 09/02/2011 1114   BILITOT 0.50 03/08/2011  9604       RADIOGRAPHIC STUDIES:  No results found.  ASSESSMENT/PLAN:  This is a very pleasant 70 year old African American female with recurrent non-small cell lung cancer adenocarcinoma. She tolerated her first cycle of Avastin without difficulty. She will proceed with her next scheduled cycle of Avastin as per ECOG protocol number 5508. She will followup in 3 weeks with restaging scan specifically a CT of the chest with contrast. She you will have an appointment with Dr. Gwenyth Bouillon in 3 weeks to review the CT scan results as well as have her labs as needed for her next cycle of Avastin. We will refer her to a podiatrist to further evaluate her foot pain and the questionable ulcer formation. We will refer her to orthopedics in reference to her increased left hip pain. Both these issues occurred prior to her cancer diagnosis, but have recently gotten worse.    Conni Slipper, PA-C     All questions were answered. The patient knows to call the clinic with any problems, questions or concerns. We can certainly see the patient much sooner if necessary.

## 2011-09-02 NOTE — Progress Notes (Signed)
Patient in to clinic today for evaluation prior to receiving maintenance treatment cycle 2. Research samples for Step 2, Cycle 2 were collected and processed per protocol. Patient accompanied by daughter who reports that patient has altered mobility related to abnormalities with feet (dry patches, tenderness, and soreness in large toenails) and hip pain. Patient reports that she has not taken pain medication that was previously prescribed due to concern about side effects. Based on physical exam by Tiana Loft, PA, and lab results and case review by Dr. Arbutus Ped, abnormalities felt to be unrelated to therapy with bevacizumab, and patient's condition is acceptable for treatment today. Patient to be referred to orthopedic doctor for evaluation of hip pain and to podiatrist for evaluation of foot abnormalities. Sign for infusion given to Vincent Peyer, RN. Notified Kathlee Nations, RN, and message left for Dr. Arbutus Ped that treatment plan orders need to be signed.

## 2011-09-09 ENCOUNTER — Telehealth: Payer: Self-pay | Admitting: Internal Medicine

## 2011-09-09 NOTE — Telephone Encounter (Signed)
S/w pt today re appts for [place] othro 12/1 (sat) @ 8 am w/dr gioffre. Pt also given appt for 12/14 @ 3:10 pm w/dr Harriet Pho . Pt put on cancellation list and aware that dr ajoluny's office will call her. Pt already has appts for dec.

## 2011-09-09 NOTE — Telephone Encounter (Signed)
Disregard 11/19 note entered under dr Clelia Croft............................Marland Kitchenerror.

## 2011-09-10 ENCOUNTER — Inpatient Hospital Stay (HOSPITAL_COMMUNITY): Admission: RE | Admit: 2011-09-10 | Payer: Medicare Other | Source: Ambulatory Visit

## 2011-09-18 ENCOUNTER — Ambulatory Visit (HOSPITAL_COMMUNITY)
Admission: RE | Admit: 2011-09-18 | Discharge: 2011-09-18 | Disposition: A | Discharge status: Home / Self Care | Payer: Medicare Other | Source: Ambulatory Visit | Attending: Physician Assistant | Admitting: Physician Assistant

## 2011-09-18 DIAGNOSIS — J438 Other emphysema: Secondary | ICD-10-CM | POA: Insufficient documentation

## 2011-09-18 DIAGNOSIS — K7689 Other specified diseases of liver: Secondary | ICD-10-CM | POA: Insufficient documentation

## 2011-09-18 DIAGNOSIS — J984 Other disorders of lung: Secondary | ICD-10-CM | POA: Insufficient documentation

## 2011-09-18 DIAGNOSIS — E079 Disorder of thyroid, unspecified: Secondary | ICD-10-CM | POA: Insufficient documentation

## 2011-09-18 DIAGNOSIS — C3490 Malignant neoplasm of unspecified part of unspecified bronchus or lung: Secondary | ICD-10-CM | POA: Insufficient documentation

## 2011-09-18 MED ORDER — IOHEXOL 300 MG/ML  SOLN
80.0000 mL | Freq: Once | INTRAMUSCULAR | Status: AC | PRN
  Administered 2011-09-18: 80 mL via INTRAVENOUS

## 2011-09-23 ENCOUNTER — Other Ambulatory Visit (HOSPITAL_BASED_OUTPATIENT_CLINIC_OR_DEPARTMENT_OTHER): Payer: Medicare Other | Admitting: Lab

## 2011-09-23 ENCOUNTER — Telehealth: Payer: Self-pay | Admitting: Internal Medicine

## 2011-09-23 ENCOUNTER — Other Ambulatory Visit: Payer: Self-pay | Admitting: Internal Medicine

## 2011-09-23 ENCOUNTER — Ambulatory Visit (HOSPITAL_BASED_OUTPATIENT_CLINIC_OR_DEPARTMENT_OTHER): Payer: Medicare Other

## 2011-09-23 ENCOUNTER — Ambulatory Visit (HOSPITAL_BASED_OUTPATIENT_CLINIC_OR_DEPARTMENT_OTHER): Payer: Medicare Other | Admitting: Physician Assistant

## 2011-09-23 ENCOUNTER — Other Ambulatory Visit: Payer: Self-pay | Admitting: Physician Assistant

## 2011-09-23 ENCOUNTER — Encounter: Payer: Self-pay | Admitting: *Deleted

## 2011-09-23 ENCOUNTER — Encounter: Payer: Self-pay | Admitting: Physician Assistant

## 2011-09-23 VITALS — BP 127/72 | HR 77 | Temp 96.9°F | Ht 65.0 in | Wt 131.9 lb

## 2011-09-23 VITALS — BP 138/68 | HR 76

## 2011-09-23 DIAGNOSIS — C3490 Malignant neoplasm of unspecified part of unspecified bronchus or lung: Secondary | ICD-10-CM

## 2011-09-23 DIAGNOSIS — C3410 Malignant neoplasm of upper lobe, unspecified bronchus or lung: Secondary | ICD-10-CM

## 2011-09-23 DIAGNOSIS — M79609 Pain in unspecified limb: Secondary | ICD-10-CM

## 2011-09-23 DIAGNOSIS — Z5112 Encounter for antineoplastic immunotherapy: Secondary | ICD-10-CM

## 2011-09-23 LAB — CBC WITH DIFFERENTIAL/PLATELET
BASO%: 0.2 % (ref 0.0–2.0)
Basophils Absolute: 0 10*3/uL (ref 0.0–0.1)
EOS%: 1.6 % (ref 0.0–7.0)
HCT: 36.9 % (ref 34.8–46.6)
HGB: 12.2 g/dL (ref 11.6–15.9)
MCH: 30.8 pg (ref 25.1–34.0)
MCHC: 32.9 g/dL (ref 31.5–36.0)
MONO#: 0.5 10*3/uL (ref 0.1–0.9)
NEUT%: 58 % (ref 38.4–76.8)
RDW: 16 % — ABNORMAL HIGH (ref 11.2–14.5)
WBC: 6.3 10*3/uL (ref 3.9–10.3)
lymph#: 2 10*3/uL (ref 0.9–3.3)

## 2011-09-23 LAB — LACTATE DEHYDROGENASE: LDH: 193 U/L (ref 94–250)

## 2011-09-23 LAB — COMPREHENSIVE METABOLIC PANEL
AST: 12 U/L (ref 0–37)
Albumin: 3.9 g/dL (ref 3.5–5.2)
BUN: 11 mg/dL (ref 6–23)
CO2: 27 mEq/L (ref 19–32)
Calcium: 10.3 mg/dL (ref 8.4–10.5)
Chloride: 106 mEq/L (ref 96–112)
Creatinine, Ser: 0.89 mg/dL (ref 0.50–1.10)
Potassium: 3.7 mEq/L (ref 3.5–5.3)

## 2011-09-23 LAB — UA PROTEIN, DIPSTICK - CHCC: Protein, Urine: <30 mg/dL

## 2011-09-23 MED ORDER — SODIUM CHLORIDE 0.9 % IJ SOLN
10.0000 mL | INTRAMUSCULAR | Status: DC | PRN
  Administered 2011-09-23: 10 mL
  Filled 2011-09-23: qty 10

## 2011-09-23 MED ORDER — HEPARIN SOD (PORK) LOCK FLUSH 100 UNIT/ML IV SOLN
500.0000 [IU] | Freq: Once | INTRAVENOUS | Status: AC | PRN
  Administered 2011-09-23: 500 [IU]
  Filled 2011-09-23: qty 5

## 2011-09-23 MED ORDER — SODIUM CHLORIDE 0.9 % IV SOLN
Freq: Once | INTRAVENOUS | Status: AC
  Administered 2011-09-23: 16:00:00 via INTRAVENOUS

## 2011-09-23 MED ORDER — SODIUM CHLORIDE 0.9 % IV SOLN
15.0000 mg/kg | Freq: Once | INTRAVENOUS | Status: AC
  Administered 2011-09-23: 950 mg via INTRAVENOUS
  Filled 2011-09-23: qty 38

## 2011-09-23 NOTE — Patient Instructions (Signed)
1632-Pt discharged ambulatory with next appointment confirmed.  Pt aware to call with any questions or concerns.

## 2011-09-23 NOTE — Progress Notes (Signed)
Patient in to clinic today for evaluation and maintenance treatment cycle 3. CT scan results reviewed by Dr. Arbutus Ped who confirms presence of stable disease, therefore patient will continue on maintenance therapy. Patient reports that she is scheduled for an MRI of her hip this Wednesday at Dr. Jeannetta Ellis office to further evaluate hip pain. She is scheduled to see podiatrist on Friday and today has very mild swelling of feet (grade 1) and some peeling of dry skin on both heels. She continues to use Arnica oil and a foot cream. Based on lab results review and physical exam by Tiana Loft, PA, patient condition is acceptable for treatment.  Patient reports that she will be traveling out of town for the holidays, and thus is requesting that she not receive treatment on Monday, December 31st (next scheduled treatment). Also, due to limited treatment availability during the holiday, patient's treatment will need to be scheduled for Wednesday, January 2nd, following the holiday closure.  Sign for infusion given to Thomasene Ripple RN.

## 2011-09-23 NOTE — Telephone Encounter (Signed)
appt set for 10/16/11 per aej order,sch given to pt. aom

## 2011-09-24 NOTE — Progress Notes (Addendum)
No images are attached to the encounter. No scans are attached to the encounter. No scans are attached to the encounter. [facility] OFFICE PROGRESS NOTE  [name],[name], [name], [name]  DIAGNOSIS: :  Recurrent non-small cell lung cancer consistent with adenocarcinoma.  This was initially diagnosed as stage IB (T2a N0 M0) well-differentiated adenocarcinoma of bronchioalveolar type in December 2010.  PRIOR THERAPY: 1.Status post right upper lobectomy on September 28, 2009 under the care of Dr. Edwyna Shell.  The patient refused adjuvant chemotherapy at that time.  She had disease recurrence in June of 2012.  2.Status post 4 cycles of systemic chemotherapy with carboplatin for an AUC of 6 and paclitaxel at 200 mg per meter squared and Avastin at 15 mg/kg according to the ECOG protocol #5508.  CURRENT THERAPY: Chemotherapy with Avastin 15 mg/kg according to the ECOG protocol #5508  INTERVAL HISTORY: [name] 70 y.o. female returns for a scheduled regular office visit for followup of her recurrent non-small cell lung cancer. She is currently receiving Avastin according ti the ECOG protocol #5508. Thus far she has tolerated this therapy without difficulty. She has had no problems with bleeding or bruising. She continues to complain of foot pain and left hip pain. In the interim she has been seen by Dr.Gioffrey an orthopedist for her left hip pain. He plans to do an MRI of this area to further evaluate her hip pain. She is scheduled to see the podiatrist for her foot pain on 09/27/2011. She does note that her heels are peeling, she continues to use the article oil. She also continues to note numbness in her fingertips and this is at baseline. She reports occasional right upper quadrant/lower rib cage pain primarily at night.She occasionally notes a little of blood on the tissue when she blows her nose in the morning. She denied any shortness of breath at this visit. MEDICAL  HISTORY: Past Medical History  Diagnosis Date  . Lung cancer 03/08/2011    recurrent  . Hypertension   . Hypothyroidism   . Hypercholesterolemia   . Glaucoma   . Hip pain 09/02/2011  . Foot pain 09/02/2011    ALLERGIES:  is allergic to amitriptyline; codeine; and sulfa antibiotics.  MEDICATIONS:  Current Outpatient Prescriptions  Medication Sig Dispense Refill  . amLODipine (NORVASC) 5 MG tablet Take 5 mg by mouth daily. Per [name], NP       . citalopram (CELEXA) 40 MG tablet Take 40 mg by mouth daily.        . clonazePAM (KLONOPIN) 0.5 MG tablet Take 0.5 mg by mouth 2 (two) times daily. Per [name], NP       . dorzolamide-timolol (COSOPT) 22.3-6.8 MG/ML ophthalmic solution 2 drops 2 (two) times daily. Per [name], NP       . gabapentin (NEURONTIN) 100 MG capsule TAKE 1 CAPSULE BY MOUTH THREE TIMES DAILY  90 capsule  1  . HYDROcodone-acetaminophen (VICODIN) 5-500 MG per tablet       . levothyroxine (SYNTHROID, LEVOTHROID) 50 MCG tablet Take 50 mcg by mouth 4 (four) times a week. Per [name], NP       . levothyroxine (SYNTHROID, LEVOTHROID) 75 MCG tablet Take 75 mcg by mouth 3 (three) times a week. Per [name], NP       . lidocaine-prilocaine (EMLA) cream Apply 1 application topically as needed.        Marland Kitchen lisinopril (PRINIVIL,ZESTRIL) 40 MG tablet Take 40 mg by mouth  daily. Per [name], NP       . pregabalin (LYRICA) 50 MG capsule Take 1 capsule (50 mg total) by mouth 3 (three) times daily.  90 capsule  2  . prochlorperazine (COMPAZINE) 10 MG tablet Take 10 mg by mouth every 6 (six) hours as needed.        . rosuvastatin (CRESTOR) 10 MG tablet Take 10 mg by mouth daily.        . timolol (TIMOPTIC) 0.5 % ophthalmic solution        No current facility-administered medications for this visit.   Facility-Administered Medications Ordered in Other Visits  Medication Dose Route Frequency Provider Last Rate Last Dose  . 0.9 %  sodium chloride infusion    Intravenous Once Mohamed K. Mohamed, MD      . 0.9 %  sodium chloride infusion   Intravenous Once Mohamed K. Mohamed, MD      . bevacizumab (AVASTIN) 950 mg in sodium chloride 0.9 % 100 mL chemo infusion  15 mg/kg (Order-Specific) Intravenous Once Mohamed K. Mohamed, MD   950 mg at 09/23/11 1555  . heparin lock flush 100 unit/mL  500 Units Intracatheter Once PRN Mohamed K. Mohamed, MD   500 Units at 09/23/11 1629  . DISCONTD: sodium chloride 0.9 % injection 10 mL  10 mL Intracatheter PRN Mohamed K. Arbutus Ped, MD   10 mL at 09/23/11 1629    SURGICAL HISTORY:  Past Surgical History  Procedure Date  . Video assisted thoracoscopy 09/28/2009  . Thoracotomy 09/28/2009    mini  . Lung lobectomy 09/28/2009    right upper    REVIEW OF SYSTEMS:  A comprehensive review of systems was negative except for: Musculoskeletal: positive for bone pain and stiff joints Feet are now peeling now but continued to be tender   PHYSICAL EXAMINATION: General appearance: alert, cooperative and mild distress Head: Normocephalic, without obvious abnormality, atraumatic Neck: no adenopathy, no carotid bruit, no JVD, supple, symmetrical, trachea midline and thyroid not enlarged, symmetric, no tenderness/mass/nodules Lymph nodes: Cervical, supraclavicular, and axillary nodes normal. Resp: clear to auscultation bilaterally Cardio: regular rate and rhythm, S1, S2 normal, no murmur, click, rub or gallop GI: soft, non-tender; bowel sounds normal; no masses,  no organomegaly Extremities: edema Trace pitting edema bilaterally, dry peeling skin at both heels worse on the left than on the right with mild erythema in both heels mild tenderness at both heels   ECOG PERFORMANCE STATUS: 1 - Symptomatic but completely ambulatory  Blood pressure 127/72, pulse 77, temperature 96.9 F (36.1 C), temperature source Oral, height 5\' 5"  (1.651 m), weight 131 lb 14.4 oz (59.829 kg).  LABORATORY DATA: Lab Results  Component Value Date    WBC 6.3 09/23/2011   HGB 12.2 09/23/2011   HCT 36.9 09/23/2011   MCV 93.6 09/23/2011   PLT 165 09/23/2011      Chemistry      Component Value Date/Time   NA 142 09/23/2011 1323   NA 142 03/08/2011 0754   K 3.7 09/23/2011 1323   K 4.2 03/08/2011 0754   CL 106 09/23/2011 1323   CL 102 03/08/2011 0754   CO2 27 09/23/2011 1323   CO2 27 03/08/2011 0754   BUN 11 09/23/2011 1323   BUN 9 03/08/2011 0754   CREATININE 0.89 09/23/2011 1323   CREATININE 1.0 03/08/2011 0754      Component Value Date/Time   CALCIUM 10.3 09/23/2011 1323   CALCIUM 9.5 03/08/2011 0754   ALKPHOS 68 09/23/2011 1323  ALKPHOS 59 03/08/2011 0754   AST 12 09/23/2011 1323   AST 19 03/08/2011 0754   ALT 10 09/23/2011 1323   BILITOT 0.3 09/23/2011 1323   BILITOT 0.50 03/08/2011 0754       RADIOGRAPHIC STUDIES:  Ct Chest W Contrast  09/18/2011  *RADIOLOGY REPORT*  Clinical Data: Restaging lung cancer.  CT CHEST WITH CONTRAST  Technique:  Multidetector CT imaging of the chest was performed following the standard protocol during bolus administration of intravenous contrast.  Contrast: 80mL OMNIPAQUE IOHEXOL 300 MG/ML IV SOLN  Comparison: 08/01/2011.  Three cysts protocol 1.1 report:  RECIST protocol 1.1 lesions:  1.  Lung, peripheral posterior right lower lobe:  2.0 cm on series 5, image 43 (measurement technique from the prior exam is preserved). 2.  Lung, superior segment left lower lobe:  4.4 cm on series 5, image 28 (measurement technique from the prior exam is preserved).  Findings: Low attenuation lesions in the thyroid measure up to 4 mm on the left, as before.  No pathologically enlarged mediastinal, hilar or axillary lymph nodes.  Atherosclerotic calcification of the arterial vasculature.  Heart size normal.  No pericardial effusion.  Postoperative changes of right upper lobectomy.  Centrilobular emphysema.  There are scattered ground-glass nodules bilaterally, which appear stable.  Index right lower lobe nodule measures  9 mm medially (image 35), stable.  A 2 cm ground-glass nodule in the posterior right lower lobe (image 43) is also stable.  Index left upper lobe ground-glass nodule measures 1.8 cm, stable. Collectively, a focal area of emphysema with peripheral ground- glass, in the left lower lobe, measures 5.4 x 3.8 cm, stable.  No pleural fluid.  Airway is unremarkable.  Incidental imaging of the upper abdomen shows a 5 mm low attenuation lesion in the right hepatic lobe, stable.  No worrisome lytic or sclerotic lesions.  IMPRESSION: Scattered bilateral pulmonary ground-glass lesions are stable. RECIST measurements are given above.  Original Report Authenticated By: Reyes Ivan, M.D.    ASSESSMENT/PLAN:  This is a very pleasant 70 year old African American female with recurrent non-small cell lung cancer adenocarcinoma. She continues to tolerate the Avastin without difficulty. She will proceed with her next scheduled cycle of Avastin as per ECOG protocol number 5508. Her restaging CT scan was essentially stable. This information was shared with [name]. She will proceed with her next scheduled cycle of Avastin as scheduled. She'll followup as per protocol. She is encouraged to keep her followup appointment with the orthopedist as well as a podiatrist regarding her hip pain and foot pain respectively.  Conni Slipper, PA-C     All questions were answered. The patient knows to call the clinic with any problems, questions or concerns. We can certainly see the patient much sooner if necessary.

## 2011-09-25 ENCOUNTER — Other Ambulatory Visit: Payer: Self-pay | Admitting: *Deleted

## 2011-09-25 DIAGNOSIS — C3490 Malignant neoplasm of unspecified part of unspecified bronchus or lung: Secondary | ICD-10-CM

## 2011-10-10 ENCOUNTER — Other Ambulatory Visit: Payer: Self-pay | Admitting: Internal Medicine

## 2011-10-15 ENCOUNTER — Other Ambulatory Visit: Payer: Self-pay | Admitting: Internal Medicine

## 2011-10-16 ENCOUNTER — Ambulatory Visit: Payer: Medicare Other | Admitting: Physician Assistant

## 2011-10-16 ENCOUNTER — Telehealth: Payer: Self-pay | Admitting: Internal Medicine

## 2011-10-16 ENCOUNTER — Other Ambulatory Visit (HOSPITAL_BASED_OUTPATIENT_CLINIC_OR_DEPARTMENT_OTHER): Payer: Medicare Other

## 2011-10-16 ENCOUNTER — Encounter: Payer: Self-pay | Admitting: Physician Assistant

## 2011-10-16 ENCOUNTER — Other Ambulatory Visit: Payer: Self-pay | Admitting: *Deleted

## 2011-10-16 ENCOUNTER — Ambulatory Visit (HOSPITAL_BASED_OUTPATIENT_CLINIC_OR_DEPARTMENT_OTHER): Payer: Medicare Other

## 2011-10-16 ENCOUNTER — Encounter: Payer: Self-pay | Admitting: *Deleted

## 2011-10-16 VITALS — BP 141/73 | HR 67 | Temp 97.5°F | Ht 65.0 in | Wt 130.9 lb

## 2011-10-16 VITALS — BP 146/75 | HR 63 | Temp 97.1°F

## 2011-10-16 DIAGNOSIS — C3490 Malignant neoplasm of unspecified part of unspecified bronchus or lung: Secondary | ICD-10-CM

## 2011-10-16 DIAGNOSIS — C3410 Malignant neoplasm of upper lobe, unspecified bronchus or lung: Secondary | ICD-10-CM

## 2011-10-16 LAB — CBC WITH DIFFERENTIAL/PLATELET
Basophils Absolute: 0 10*3/uL (ref 0.0–0.1)
Eosinophils Absolute: 0.1 10*3/uL (ref 0.0–0.5)
LYMPH%: 27.7 % (ref 14.0–49.7)
MCV: 92.3 fL (ref 79.5–101.0)
MONO%: 7.9 % (ref 0.0–14.0)
NEUT#: 3.4 10*3/uL (ref 1.5–6.5)
Platelets: 150 10*3/uL (ref 145–400)
RBC: 4.14 10*6/uL (ref 3.70–5.45)

## 2011-10-16 LAB — COMPREHENSIVE METABOLIC PANEL
Alkaline Phosphatase: 70 U/L (ref 39–117)
BUN: 10 mg/dL (ref 6–23)
Glucose, Bld: 103 mg/dL — ABNORMAL HIGH (ref 70–99)
Sodium: 137 mEq/L (ref 135–145)
Total Bilirubin: 0.3 mg/dL (ref 0.3–1.2)

## 2011-10-16 LAB — LACTATE DEHYDROGENASE: LDH: 201 U/L (ref 94–250)

## 2011-10-16 LAB — UA PROTEIN, DIPSTICK - CHCC: Protein, Urine: NEGATIVE mg/dL

## 2011-10-16 MED ORDER — HEPARIN SOD (PORK) LOCK FLUSH 100 UNIT/ML IV SOLN
500.0000 [IU] | Freq: Once | INTRAVENOUS | Status: AC | PRN
  Administered 2011-10-16: 500 [IU]
  Filled 2011-10-16: qty 5

## 2011-10-16 MED ORDER — SODIUM CHLORIDE 0.9 % IV SOLN: Freq: Once | INTRAVENOUS | Status: DC

## 2011-10-16 MED ORDER — PREGABALIN 75 MG PO CAPS: 75.0000 mg | ORAL_CAPSULE | Freq: Three times a day (TID) | ORAL | Status: DC

## 2011-10-16 MED ORDER — SODIUM CHLORIDE 0.9 % IJ SOLN
10.0000 mL | INTRAMUSCULAR | Status: DC | PRN
  Administered 2011-10-16: 10 mL
  Filled 2011-10-16: qty 10

## 2011-10-16 MED ORDER — SODIUM CHLORIDE 0.9 % IV SOLN
15.0000 mg/kg | Freq: Once | INTRAVENOUS | Status: AC
  Administered 2011-10-16: 900 mg via INTRAVENOUS
  Filled 2011-10-16: qty 36

## 2011-10-16 NOTE — Progress Notes (Signed)
No images are attached to the encounter. No scans are attached to the encounter. No scans are attached to the encounter. [facility] OFFICE PROGRESS NOTE  [name],[name], [name], [name]  DIAGNOSIS: :  Recurrent non-small cell lung cancer consistent with adenocarcinoma.  This was initially diagnosed as stage IB (T2a N0 M0) well-differentiated adenocarcinoma of bronchioalveolar type in December 2010.  PRIOR THERAPY: 1.Status post right upper lobectomy on September 28, 2009 under the care of Dr. Edwyna Shell.  The patient refused adjuvant chemotherapy at that time.  She had disease recurrence in June of 2012.  2.Status post 4 cycles of systemic chemotherapy with carboplatin for an AUC of 6 and paclitaxel at 200 mg per meter squared and Avastin at 15 mg/kg according to the ECOG protocol #5508.  CURRENT THERAPY: Chemotherapy with Avastin 15 mg/kg according to the ECOG protocol #5508  INTERVAL HISTORY: [name] 71 y.o. female returns for a scheduled regular office visit for followup of her recurrent non-small cell lung cancer. She is currently receiving Avastin according ti the ECOG protocol #5508. Thus far she has tolerated this therapy without difficulty. She has had no problems with bleeding or bruising. She complains of some pain in her right arm and shoulder. She initially felt as though she strained the arm but doesn't recall any specific trauma. She had some similar discomfort in the left arm, both upper extremity discomfort have resolved. 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 Mild acetabular labral degeneration and fraying bilaterally. Also uterine fibroids the largest visualized fibroid measuring 2 cm were noted she was also evaluated by the podiatrist and had her cholecystectomy and down a bit. She is walking better  as a result. She continues to use the arnica oil as well as a cream that she obtained from the podiatrist. It was suggested by the podiatrist that her Lyrica the increased slightly to address her continued symptoms of peripheral neuropathy. She voiced no other complaints.    MEDICAL HISTORY: Past Medical History  Diagnosis Date  . Lung cancer 03/08/2011    recurrent  . Hypertension   . Hypothyroidism   . Hypercholesterolemia   . Glaucoma   . Hip pain 09/02/2011  . Foot pain 09/02/2011    ALLERGIES:  is allergic to amitriptyline; codeine; and sulfa antibiotics.  MEDICATIONS:  Current Outpatient Prescriptions  Medication Sig Dispense Refill  . amLODipine (NORVASC) 5 MG tablet Take 5 mg by mouth daily. Per [name], NP       . citalopram (CELEXA) 40 MG tablet Take 40 mg by mouth daily.        . clonazePAM (KLONOPIN) 0.5 MG tablet Take 0.5 mg by mouth 2 (two) times daily. Per [name], NP       . dorzolamide-timolol (COSOPT) 22.3-6.8 MG/ML ophthalmic solution 2 drops 2 (two) times daily. Per [name], NP       . HYDROcodone-acetaminophen (VICODIN) 5-500 MG per tablet       . levothyroxine (SYNTHROID, LEVOTHROID) 50 MCG tablet Take 50 mcg by mouth 4 (four) times a week. Per [name], NP       . levothyroxine (SYNTHROID, LEVOTHROID) 75 MCG tablet Take 75 mcg by mouth 3 (three) times a week. Per [name], NP       . lidocaine-prilocaine (EMLA) cream Apply 1 application topically as needed.        Marland Kitchen  lisinopril (PRINIVIL,ZESTRIL) 40 MG tablet Take 40 mg by mouth daily. Per [name], NP       . pregabalin (LYRICA) 75 MG capsule Take 1 capsule (75 mg total) by mouth 3 (three) times daily.  90 capsule  2  . prochlorperazine (COMPAZINE) 10 MG tablet Take 10 mg by mouth every 6 (six) hours as needed.        . rosuvastatin (CRESTOR) 10 MG tablet Take 10 mg by mouth daily.        . timolol (TIMOPTIC) 0.5 % ophthalmic solution        No current facility-administered  medications for this visit.   Facility-Administered Medications Ordered in Other Visits  Medication Dose Route Frequency Provider Last Rate Last Dose  . bevacizumab (AVASTIN) 900 mg in sodium chloride 0.9 % 100 mL chemo infusion  15 mg/kg (Order-Specific) Intravenous Once Mohamed K. Mohamed, MD   900 mg at 10/16/11 1146  . heparin lock flush 100 unit/mL  500 Units Intracatheter Once PRN Mohamed K. Mohamed, MD   500 Units at 10/16/11 1230  . DISCONTD: 0.9 %  sodium chloride infusion   Intravenous Once Mohamed K. Mohamed, MD      . DISCONTD: 0.9 %  sodium chloride infusion   Intravenous Once Mohamed K. Mohamed, MD      . DISCONTD: sodium chloride 0.9 % injection 10 mL  10 mL Intracatheter PRN Mohamed K. Mohamed, MD   10 mL at 10/16/11 1230    SURGICAL HISTORY:  Past Surgical History  Procedure Date  . Video assisted thoracoscopy 09/28/2009  . Thoracotomy 09/28/2009    mini  . Lung lobectomy 09/28/2009    right upper    REVIEW OF SYSTEMS:  A comprehensive review of systems was negative except for: Musculoskeletal: positive for bone pain and stiff joints Neurological: positive for paresthesia Feet less tender with less peeling/scaling   PHYSICAL EXAMINATION: General appearance: alert, cooperative and mild distress Head: Normocephalic, without obvious abnormality, atraumatic Neck: no adenopathy, no carotid bruit, no JVD, supple, symmetrical, trachea midline and thyroid not enlarged, symmetric, no tenderness/mass/nodules Lymph nodes: Cervical, supraclavicular, and axillary nodes normal. Resp: clear to auscultation bilaterally Cardio: regular rate and rhythm, S1, S2 normal, no murmur, click, rub or gallop GI: soft, non-tender; bowel sounds normal; no masses,  no organomegaly Extremities: edema Trace pitting edema bilaterally, dry peeling skin at both heels worse on the left than on the right with minimal erythema in both heels   ECOG PERFORMANCE STATUS: 1 - Symptomatic but completely  ambulatory  Blood pressure 141/73, pulse 67, temperature 97.5 F (36.4 C), temperature source Oral, height 5\' 5"  (1.651 m), weight 130 lb 14.4 oz (59.376 kg).  LABORATORY DATA: Lab Results  Component Value Date   WBC 5.4 10/16/2011   HGB 12.8 10/16/2011   HCT 38.2 10/16/2011   MCV 92.3 10/16/2011   PLT 150 10/16/2011      Chemistry      Component Value Date/Time   NA 137 10/16/2011 0929   NA 142 03/08/2011 0754   K 3.7 10/16/2011 0929   K 4.2 03/08/2011 0754   CL 102 10/16/2011 0929   CL 102 03/08/2011 0754   CO2 27 10/16/2011 0929   CO2 27 03/08/2011 0754   BUN 10 10/16/2011 0929   BUN 9 03/08/2011 0754   CREATININE 0.96 10/16/2011 0929   CREATININE 1.0 03/08/2011 0754      Component Value Date/Time   CALCIUM 10.1 10/16/2011 0929   CALCIUM 9.5 03/08/2011 0754  ALKPHOS 70 10/16/2011 0929   ALKPHOS 59 03/08/2011 0754   AST 13 10/16/2011 0929   AST 19 03/08/2011 0754   ALT 9 10/16/2011 0929   BILITOT 0.3 10/16/2011 0929   BILITOT 0.50 03/08/2011 0754       RADIOGRAPHIC STUDIES:  Ct Chest W Contrast  09/18/2011  *RADIOLOGY REPORT*  Clinical Data: Restaging lung cancer.  CT CHEST WITH CONTRAST  Technique:  Multidetector CT imaging of the chest was performed following the standard protocol during bolus administration of intravenous contrast.  Contrast: 80mL OMNIPAQUE IOHEXOL 300 MG/ML IV SOLN  Comparison: 08/01/2011.  Three cysts protocol 1.1 report:  RECIST protocol 1.1 lesions:  1.  Lung, peripheral posterior right lower lobe:  2.0 cm on series 5, image 43 (measurement technique from the prior exam is preserved). 2.  Lung, superior segment left lower lobe:  4.4 cm on series 5, image 28 (measurement technique from the prior exam is preserved).  Findings: Low attenuation lesions in the thyroid measure up to 4 mm on the left, as before.  No pathologically enlarged mediastinal, hilar or axillary lymph nodes.  Atherosclerotic calcification of the arterial vasculature.  Heart size normal.  No pericardial effusion.   Postoperative changes of right upper lobectomy.  Centrilobular emphysema.  There are scattered ground-glass nodules bilaterally, which appear stable.  Index right lower lobe nodule measures 9 mm medially (image 35), stable.  A 2 cm ground-glass nodule in the posterior right lower lobe (image 43) is also stable.  Index left upper lobe ground-glass nodule measures 1.8 cm, stable. Collectively, a focal area of emphysema with peripheral ground- glass, in the left lower lobe, measures 5.4 x 3.8 cm, stable.  No pleural fluid.  Airway is unremarkable.  Incidental imaging of the upper abdomen shows a 5 mm low attenuation lesion in the right hepatic lobe, stable.  No worrisome lytic or sclerotic lesions.  IMPRESSION: Scattered bilateral pulmonary ground-glass lesions are stable. RECIST measurements are given above.  Original Report Authenticated By: Reyes Ivan, M.D.    ASSESSMENT/PLAN:  This is a very pleasant 71 year old African American female with recurrent non-small cell lung cancer adenocarcinoma. She continues to tolerate the Avastin without difficulty. She will proceed with her next scheduled cycle of Avastin as per ECOG protocol number 5508. She will be set up for next restaging scans approximately January 17 with followup appointment with Dr. Arbutus Ped the following week per protocol with a repeat CBC differential C. met and urine protein dipstick and labs per protocol. Patient was discussed with Dr. Arbutus Ped and we will increase her Lyrica to 75 mg by mouth 3 times daily to address her symptoms of peripheral neuropathy.   Conni Slipper, PA-C     All questions were answered. The patient knows to call the clinic with any problems, questions or concerns. We can certainly see the patient much sooner if necessary.

## 2011-10-16 NOTE — Patient Instructions (Signed)
Patient aware of next appointment; discharged home alone

## 2011-10-16 NOTE — Progress Notes (Signed)
Pre and post Avastin vital signs stable. 

## 2011-10-16 NOTE — Progress Notes (Signed)
Patient in to clinic this morning for evaluation prior to maintenance treatment cycle 4. Overall patient is doing well and notes some improvement in ambulation, but continues to report neuropathy symptoms. Reports from podiatry and orthopedic MRI reviewed by PA as well. Based on lab results review and physical exam by Tiana Loft, PA, patient condition is acceptable for treatment.  Patient states that she agrees to continue treatment cycles every 3 weeks, with reset to Wednesdays as treatment day based on rescheduling this week due to holiday. Patient aware that CT scan will be performed in approximately two weeks, with next evaluation and treatment scheduled for 11/06/2011. Patient is in agreement with this plan.  Sign for infusion given to Raliegh Ip, RN.

## 2011-10-16 NOTE — Telephone Encounter (Signed)
gv pt appt schedule for jan including ct for 1/21 @  3 pm @ [facility].

## 2011-10-16 NOTE — Telephone Encounter (Signed)
gv pt appt schedule for jan °

## 2011-10-22 ENCOUNTER — Telehealth: Payer: Self-pay | Admitting: Internal Medicine

## 2011-10-22 NOTE — Telephone Encounter (Signed)
Per message from cindy (research) 1/21 ct was requested for 1/17. appt moved to 1/17 @ 3 pm @ [facility]. sw pt today re change w/new d/t. Also confirmed 1/23 appts.

## 2011-10-25 ENCOUNTER — Other Ambulatory Visit: Payer: Self-pay | Admitting: *Deleted

## 2011-10-31 ENCOUNTER — Inpatient Hospital Stay (HOSPITAL_COMMUNITY)
Admission: RE | Admit: 2011-10-31 | Discharge: 2011-10-31 | Payer: Medicare Other | Source: Ambulatory Visit | Attending: Physician Assistant | Admitting: Physician Assistant

## 2011-11-04 ENCOUNTER — Telehealth: Payer: Self-pay | Admitting: Internal Medicine

## 2011-11-04 ENCOUNTER — Other Ambulatory Visit (HOSPITAL_COMMUNITY): Payer: Medicare Other

## 2011-11-04 ENCOUNTER — Other Ambulatory Visit: Payer: Self-pay | Admitting: Internal Medicine

## 2011-11-04 ENCOUNTER — Ambulatory Visit (HOSPITAL_BASED_OUTPATIENT_CLINIC_OR_DEPARTMENT_OTHER): Payer: Self-pay | Admitting: Internal Medicine

## 2011-11-04 ENCOUNTER — Ambulatory Visit (HOSPITAL_COMMUNITY)
Admission: RE | Admit: 2011-11-04 | Discharge: 2011-11-04 | Disposition: A | Discharge status: Home / Self Care | Payer: Medicare Other | Source: Ambulatory Visit | Attending: Physician Assistant | Admitting: Physician Assistant

## 2011-11-04 ENCOUNTER — Ambulatory Visit (HOSPITAL_BASED_OUTPATIENT_CLINIC_OR_DEPARTMENT_OTHER): Payer: Medicare Other

## 2011-11-04 ENCOUNTER — Ambulatory Visit (HOSPITAL_BASED_OUTPATIENT_CLINIC_OR_DEPARTMENT_OTHER): Payer: Medicare Other | Admitting: Internal Medicine

## 2011-11-04 ENCOUNTER — Other Ambulatory Visit: Payer: Self-pay | Admitting: Pharmacist

## 2011-11-04 DIAGNOSIS — I700 Atherosclerosis of aorta: Secondary | ICD-10-CM | POA: Insufficient documentation

## 2011-11-04 DIAGNOSIS — I2510 Atherosclerotic heart disease of native coronary artery without angina pectoris: Secondary | ICD-10-CM | POA: Insufficient documentation

## 2011-11-04 DIAGNOSIS — I2699 Other pulmonary embolism without acute cor pulmonale: Secondary | ICD-10-CM

## 2011-11-04 DIAGNOSIS — C3490 Malignant neoplasm of unspecified part of unspecified bronchus or lung: Secondary | ICD-10-CM

## 2011-11-04 DIAGNOSIS — C801 Malignant (primary) neoplasm, unspecified: Secondary | ICD-10-CM

## 2011-11-04 DIAGNOSIS — J984 Other disorders of lung: Secondary | ICD-10-CM | POA: Insufficient documentation

## 2011-11-04 MED ORDER — WARFARIN SODIUM 5 MG PO TABS: 5.0000 mg | ORAL_TABLET | Freq: Every day | ORAL | Status: DC

## 2011-11-04 MED ORDER — IOHEXOL 300 MG/ML  SOLN
80.0000 mL | Freq: Once | INTRAMUSCULAR | Status: AC | PRN
  Administered 2011-11-04: 80 mL via INTRAVENOUS

## 2011-11-04 MED ORDER — FONDAPARINUX SODIUM 7.5 MG/0.6ML ~~LOC~~ SOLN
7.5000 mg | Freq: Once | SUBCUTANEOUS | Status: AC
  Administered 2011-11-04: 7.5 mg via SUBCUTANEOUS
  Filled 2011-11-04: qty 0.6

## 2011-11-04 NOTE — Telephone Encounter (Signed)
l/m regarding 1/22 cx appt and doing lab/coag clinic on 1/25,aom

## 2011-11-04 NOTE — Progress Notes (Signed)
[facility] OFFICE PROGRESS NOTE  [name],[name], [name], [name]  DIAGNOSIS: :  #1 Recurrent non-small cell lung cancer consistent with adenocarcinoma. This was initially diagnosed as stage IB (T2a N0 M0) well-differentiated adenocarcinoma of bronchioalveolar type in December 2010.  #2 Acute pulmonary embolism in the right lower lobe lobar, segmental and subsegmental sized pulmonary arteries, diagnosed on 11/04/2011.   PRIOR THERAPY:  1.Status post right upper lobectomy on September 28, 2009 under the care of Dr. Edwyna Shell. The patient refused adjuvant chemotherapy at that time. She had disease recurrence in June of 2012.  2.Status post 4 cycles of systemic chemotherapy with carboplatin for an AUC of 6 and paclitaxel at 200 mg per meter squared and Avastin at 15 mg/kg according to the ECOG protocol #5508.  CURRENT THERAPY: Chemotherapy with Avastin 15 mg/kg according to the ECOG protocol #5508   INTERVAL HISTORY: [name] 71 y.o. female returns to the clinic today for followup visit. The patient is currently on maintenance chemotherapy with Avastin according to the ECoG protocol 5508. She status post 3 cycles. She had the CT scan of the chest performed earlier today and call report from radiology showed acute pulmonary embolism in the right lower lobe lobar, segment and subsegmental sized pulmonary arteries. The patient was advised to come to the cancer Center for evaluation and recommendation regarding her treatment. She has no specific complaints today except for mild fatigue. She denied having any significant chest pain. She has mild shortness of breast with exertion, no cough or hemoptysis. She continues to complain of peripheral neuropathy in the lower extremities and she is currently on treatment with Neurontin.  MEDICAL HISTORY: Past Medical History  Diagnosis Date  . Lung cancer 03/08/2011    recurrent  . Hypertension   . Hypothyroidism   .  Hypercholesterolemia   . Glaucoma   . Hip pain 09/02/2011  . Foot pain 09/02/2011    ALLERGIES:  is allergic to amitriptyline; codeine; and sulfa antibiotics.  MEDICATIONS:  Current Outpatient Prescriptions  Medication Sig Dispense Refill  . amLODipine (NORVASC) 5 MG tablet Take 5 mg by mouth daily. Per [name], NP       . citalopram (CELEXA) 40 MG tablet Take 40 mg by mouth daily.        . clonazePAM (KLONOPIN) 0.5 MG tablet Take 0.5 mg by mouth 2 (two) times daily. Per [name], NP       . dorzolamide-timolol (COSOPT) 22.3-6.8 MG/ML ophthalmic solution 2 drops 2 (two) times daily. Per [name], NP       . HYDROcodone-acetaminophen (VICODIN) 5-500 MG per tablet       . levothyroxine (SYNTHROID, LEVOTHROID) 50 MCG tablet Take 50 mcg by mouth 4 (four) times a week. Per [name], NP       . levothyroxine (SYNTHROID, LEVOTHROID) 75 MCG tablet Take 75 mcg by mouth 3 (three) times a week. Per [name], NP       . lidocaine-prilocaine (EMLA) cream Apply 1 application topically as needed.        Marland Kitchen lisinopril (PRINIVIL,ZESTRIL) 40 MG tablet Take 40 mg by mouth daily. Per [name], NP       . pregabalin (LYRICA) 75 MG capsule Take 1 capsule (75 mg total) by mouth 3 (three) times daily.  90 capsule  2  . prochlorperazine (COMPAZINE) 10 MG tablet Take 10 mg by mouth every 6 (six) hours as needed.        . rosuvastatin (  CRESTOR) 10 MG tablet Take 10 mg by mouth daily.        . timolol (TIMOPTIC) 0.5 % ophthalmic solution       . warfarin (COUMADIN) 5 MG tablet Take 1 tablet (5 mg total) by mouth daily.  30 tablet  2   No current facility-administered medications for this visit.   Facility-Administered Medications Ordered in Other Visits  Medication Dose Route Frequency Provider Last Rate Last Dose  . fondaparinux (ARIXTRA) injection 7.5 mg  7.5 mg Subcutaneous Once Atira Borello K. Yevette Knust, MD   7.5 mg at 11/04/11 1328  . iohexol (OMNIPAQUE) 300 MG/ML solution 80 mL  80 mL  Intravenous Once PRN Medication Radiologist, MD   80 mL at 11/04/11 1214    SURGICAL HISTORY:  Past Surgical History  Procedure Date  . Video assisted thoracoscopy 09/28/2009  . Thoracotomy 09/28/2009    mini  . Lung lobectomy 09/28/2009    right upper    REVIEW OF SYSTEMS:  A comprehensive review of systems was negative except for: Constitutional: positive for fatigue Respiratory: positive for dyspnea on exertion Neurological: positive for paresthesia   PHYSICAL EXAMINATION: General appearance: alert, cooperative, appears stated age and no distress Resp: clear to auscultation bilaterally Cardio: regular rate and rhythm, S1, S2 normal, no murmur, click, rub or gallop GI: soft, non-tender; bowel sounds normal; no masses,  no organomegaly Extremities: extremities normal, atraumatic, no cyanosis or edema  ECOG PERFORMANCE STATUS: 1 - Symptomatic but completely ambulatory  There were no vitals taken for this visit.  LABORATORY DATA: Lab Results  Component Value Date   WBC 5.4 10/16/2011   HGB 12.8 10/16/2011   HCT 38.2 10/16/2011   MCV 92.3 10/16/2011   PLT 150 10/16/2011      Chemistry      Component Value Date/Time   NA 137 10/16/2011 0929   NA 142 03/08/2011 0754   K 3.7 10/16/2011 0929   K 4.2 03/08/2011 0754   CL 102 10/16/2011 0929   CL 102 03/08/2011 0754   CO2 27 10/16/2011 0929   CO2 27 03/08/2011 0754   BUN 10 10/16/2011 0929   BUN 9 03/08/2011 0754   CREATININE 0.96 10/16/2011 0929   CREATININE 1.0 03/08/2011 0754      Component Value Date/Time   CALCIUM 10.1 10/16/2011 0929   CALCIUM 9.5 03/08/2011 0754   ALKPHOS 70 10/16/2011 0929   ALKPHOS 59 03/08/2011 0754   AST 13 10/16/2011 0929   AST 19 03/08/2011 0754   ALT 9 10/16/2011 0929   BILITOT 0.3 10/16/2011 0929   BILITOT 0.50 03/08/2011 0754       RADIOGRAPHIC STUDIES: Ct Chest W Contrast  11/04/2011  *RADIOLOGY REPORT*  Clinical Data: 71 year old female with history of lung cancer diagnosed in 2010.  Chemotherapy in progress.   RECIST protocol.  CT CHEST WITH CONTRAST  Technique:  Multidetector CT imaging of the chest was performed following the standard protocol during bolus administration of intravenous contrast.  Contrast: 80mL OMNIPAQUE IOHEXOL 300 MG/ML IV SOLN  Comparison: CT of the thorax with contrast 09/18/2011.  Findings:  RECIST Protocol 1.1 Report:  Protocol 1.1 Lesions:  1. Peripheral right lower lobe lesion unchanged measuring 2.0 cm (image 42 of series 5).  2. Cystic appearing superior segment left lower lobe lesion unchanged measuring 4.4 cm (when measured on image 26 of series 5 using the previously demonstrated measurement techniques).  Mediastinum:  Heart size is normal.  No pericardial fluid, thickening or calcification.  No acute abnormality  of the thoracic aorta or other great vessels of the mediastinum. A large filling defect is noted in the right lower lobe pulmonary artery extending into segmental and subsegmental branches of the right lower lobe. No other definite filling defects are noted on this examination (this was not specifically tailored for evaluation of pulmonary embolism).  There is atherosclerosis of the thoracic aorta, the great vessels of the mediastinum and the coronary arteries, including calcified atherosclerotic plaque in the left main and LAD coronary arteries. No pathologically enlarged mediastinal or hilar lymph nodes.  There are multiple calcified left hilar lymph nodes. The esophagus is normal in appearance.  Right internal jugular single lumen Port-A-Cath tip terminating in the superior aspect of the right atrium.  Lungs/Pleura:  Status post right upper lobectomy. Large calcified granuloma in the lateral aspect the left upper lobe.  No consolidative airspace disease.  No pleural effusions. Again noted are multiple predominately ground-glass attenuation nodules scattered throughout the lungs bilaterally, several of these demonstrating internal areas of cystic change.  The largest lesion is  centered in the superior segment of the left lower lobe, demonstrating predominately internal cystic change with periphery of ground-glass attenuation (total lesion including peripheral ground-glass attenuation measures approximately 5.5 x 3.8 cm).  9 mm ground-glass attenuation nodule medial basal segment right lower lobe (image 33 of series 5), unchanged.  1.6 cm ground-glass attenuation nodule left lower lobe (image 33 of series 5), unchanged.  1.8 cm pleural-based nodule and apex of left upper lobe (image 12 of series 5), unchanged. Please see additional measurements for RECIST protocol 1.1 lesions above. Multiple additional smaller ground-glass attenuation regions are all similar to prior studies.  No consolidative airspace disease.  No pleural effusions.  Upper Abdomen: Multiple calcifications seen scattered throughout the liver and spleen, consistent with granulomas. 5 mm low attenuation lesion in segment eight of the liver is too small to characterize, but unchanged compared to prior study, and statistically likely represent a small cyst or biliary hamartoma. Atherosclerosis.  Musculoskeletal:  No aggressive appearing lytic or blastic lesions are noted in the visualized portions of the skeleton.  IMPRESSION:  1. Acute pulmonary embolism in the right lower lobe lobar, segmental and subsegmental sized pulmonary arteries.  These results were called by telephone on 11/04/2011  at  12:30 p.m. to  P.A. Kathleen Argue, who verbally acknowledged these results.  2.  No significant interval change in multiple ground-glass attenuation pulmonary nodules, including the index RECIST protocol 1.1 lesions, as detailed above when compared to prior examinations. 3.  No new or enlarging suspicious appearing pulmonary nodules otherwise identified. 4.  Status post right upper lobectomy. 5.  Atherosclerosis, including the left main and LAD coronary artery disease. Please note that although the presence of coronary artery calcium  documents the presence of coronary artery disease, the severity of this disease and any potential stenosis cannot be assessed on this non-gated CT examination.  Assessment for potential risk factor modification, dietary therapy or pharmacologic therapy may be warranted, if clinically indicated. 6.  Sequela of old granulomatous disease, as above.  Original Report Authenticated By: Florencia Reasons, M.D.    ASSESSMENT:  #1 metastatic non-small cell lung cancer, adenocarcinoma. #2 acute pulmonary emboli involving the right lower lobe.  PLAN: #1 the patient has no evidence for disease progression on his recent scan. If she still eligible to continue with the study, we will proceed with her treatment with the Avastin as scheduled next week. #2 for the acute pulmonary embolism, I started  the patient on Arixtra 7.5 mg subcutaneously daily for the next 5 days. The patient was started after his dose of Coumadin 5 mg by mouth daily tomorrow. She would be followed by the Coumadin clinic with target INR of 2-3. The patient would be treated for at least 6 months with anticoagulation. The patient was advised to go to the emergency department immediately if she has any significant chest pain or shortness of breath. #3 the patient would come back for followup visit as previously scheduled.  All questions were answered. The patient knows to call the clinic with any problems, questions or concerns. We can certainly see the patient much sooner if necessary.  I spent 20 minutes counseling the patient face to face. The total time spent in the appointment was 40 minutes.

## 2011-11-04 NOTE — Telephone Encounter (Signed)
gv pt appt schedule for jan. Added daily inj appts for 5days per MM and coumadin clinic for 1/22

## 2011-11-04 NOTE — Progress Notes (Signed)
Consult for pt of Dr. Arbutus Ped who is 71 y.o. Female w/ new acute PE. Started on Arixtra 7.5 mg/day x 5 days to be given here at [facility] daily until INR = 2-3.  For now she is set up for 5 days of injections.   RX given to pt from Dr. Arbutus Ped to begin Coumadin 5 mg/day this evening. Planned duration of Anticoag: 6 months Goal INR = 2-3 Risk factor for bleeding: Recurrent NSCLC.  Chemo tx w/ Avastin (study pt) We will see pt for initial establishment visit on 11/08/11 at 12 pm.   Pt will need to continue Arixtra here at [facility] after 11/08/11 if her INR is not within goal of 2-3 per Dr. Arbutus Ped.  If necessary, we will need to get help from Dr. Asa Lente desk RN to set up future appts. Marily Lente, Pharm.D.

## 2011-11-05 ENCOUNTER — Other Ambulatory Visit (HOSPITAL_COMMUNITY): Payer: Medicare Other

## 2011-11-05 ENCOUNTER — Ambulatory Visit: Payer: Medicare Other

## 2011-11-05 ENCOUNTER — Ambulatory Visit (HOSPITAL_BASED_OUTPATIENT_CLINIC_OR_DEPARTMENT_OTHER): Payer: Medicare Other

## 2011-11-05 VITALS — BP 139/74 | HR 67 | Temp 97.0°F

## 2011-11-05 DIAGNOSIS — I2699 Other pulmonary embolism without acute cor pulmonale: Secondary | ICD-10-CM

## 2011-11-05 MED ORDER — FONDAPARINUX SODIUM 7.5 MG/0.6ML ~~LOC~~ SOLN
7.5000 mg | Freq: Once | SUBCUTANEOUS | Status: AC
  Administered 2011-11-05: 7.5 mg via SUBCUTANEOUS
  Filled 2011-11-05: qty 0.6

## 2011-11-06 ENCOUNTER — Other Ambulatory Visit (HOSPITAL_BASED_OUTPATIENT_CLINIC_OR_DEPARTMENT_OTHER): Payer: Medicare Other

## 2011-11-06 ENCOUNTER — Ambulatory Visit (HOSPITAL_BASED_OUTPATIENT_CLINIC_OR_DEPARTMENT_OTHER): Payer: Medicare Other | Admitting: Internal Medicine

## 2011-11-06 ENCOUNTER — Ambulatory Visit (HOSPITAL_BASED_OUTPATIENT_CLINIC_OR_DEPARTMENT_OTHER): Payer: Medicare Other

## 2011-11-06 ENCOUNTER — Ambulatory Visit: Payer: Medicare Other

## 2011-11-06 ENCOUNTER — Encounter: Payer: Self-pay | Admitting: *Deleted

## 2011-11-06 DIAGNOSIS — I2699 Other pulmonary embolism without acute cor pulmonale: Secondary | ICD-10-CM

## 2011-11-06 DIAGNOSIS — Z5111 Encounter for antineoplastic chemotherapy: Secondary | ICD-10-CM

## 2011-11-06 DIAGNOSIS — C3490 Malignant neoplasm of unspecified part of unspecified bronchus or lung: Secondary | ICD-10-CM

## 2011-11-06 DIAGNOSIS — C801 Malignant (primary) neoplasm, unspecified: Secondary | ICD-10-CM

## 2011-11-06 DIAGNOSIS — Z09 Encounter for follow-up examination after completed treatment for conditions other than malignant neoplasm: Secondary | ICD-10-CM

## 2011-11-06 DIAGNOSIS — C3410 Malignant neoplasm of upper lobe, unspecified bronchus or lung: Secondary | ICD-10-CM

## 2011-11-06 LAB — COMPREHENSIVE METABOLIC PANEL
ALT: 5 U/L (ref 0–35)
AST: 9 U/L (ref 0–37)
Albumin: 3.8 g/dL (ref 3.5–5.2)
Calcium: 10.2 mg/dL (ref 8.4–10.5)
Chloride: 103 mEq/L (ref 96–112)
Potassium: 3.7 mEq/L (ref 3.5–5.3)
Sodium: 140 mEq/L (ref 135–145)

## 2011-11-06 LAB — CBC WITH DIFFERENTIAL/PLATELET
BASO%: 0.8 % (ref 0.0–2.0)
EOS%: 1.5 % (ref 0.0–7.0)
HGB: 12.4 g/dL (ref 11.6–15.9)
MCH: 30.3 pg (ref 25.1–34.0)
MCHC: 33.8 g/dL (ref 31.5–36.0)
RBC: 4.09 10*6/uL (ref 3.70–5.45)
RDW: 14.1 % (ref 11.2–14.5)
lymph#: 1.9 10*3/uL (ref 0.9–3.3)

## 2011-11-06 LAB — PROTIME-INR: INR: 1.1 — ABNORMAL LOW (ref 2.00–3.50)

## 2011-11-06 MED ORDER — FONDAPARINUX SODIUM 7.5 MG/0.6ML ~~LOC~~ SOLN
7.5000 mg | Freq: Once | SUBCUTANEOUS | Status: AC
  Administered 2011-11-06: 7.5 mg via SUBCUTANEOUS
  Filled 2011-11-06: qty 0.6

## 2011-11-06 MED ORDER — SODIUM CHLORIDE 0.9 % IJ SOLN
10.0000 mL | INTRAMUSCULAR | Status: DC | PRN
  Administered 2011-11-06: 10 mL
  Filled 2011-11-06: qty 10

## 2011-11-06 MED ORDER — SODIUM CHLORIDE 0.9 % IV SOLN: Freq: Once | INTRAVENOUS | Status: DC

## 2011-11-06 MED ORDER — HEPARIN SOD (PORK) LOCK FLUSH 100 UNIT/ML IV SOLN
500.0000 [IU] | Freq: Once | INTRAVENOUS | Status: AC | PRN
  Administered 2011-11-06: 500 [IU]
  Filled 2011-11-06: qty 5

## 2011-11-06 MED ORDER — SODIUM CHLORIDE 0.9 % IV SOLN
15.0000 mg/kg | Freq: Once | INTRAVENOUS | Status: AC
  Administered 2011-11-06: 900 mg via INTRAVENOUS
  Filled 2011-11-06: qty 36

## 2011-11-06 NOTE — Progress Notes (Signed)
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 This was initially diagnosed as stage IB (T2a N0 M0) well-differentiated adenocarcinoma of bronchioalveolar type in December 2010.  #2 Acute pulmonary embolism in the right lower lobe lobar, segmental and subsegmental sized pulmonary arteries, diagnosed on 11/04/2011.   PRIOR THERAPY:  1.Status post right upper lobectomy on September 28, 2009 under the care of Dr. Edwyna Shell. The patient refused adjuvant chemotherapy at that time. She had disease recurrence in June of 2012.  2.Status post 4 cycles of systemic chemotherapy with carboplatin for an AUC of 6 and paclitaxel at 200 mg per meter squared and Avastin at 15 mg/kg according to the ECOG protocol #5508.   CURRENT THERAPY: Chemotherapy with Avastin 15 mg/kg according to the ECOG protocol #5508 status post 3 cycles.   INTERVAL HISTORY: [name] 71 y.o. female returns to the clinic today for followup visit. The patient was seen 2 days ago after a CT scan of the chest showed acute pulmonary embolism in the right lower lobe. This was incidental finding as the patient was asymptomatic. She was treated on outpatient basis with Arixtra 7.5 mg subcutaneously on daily basis until her PT/INR becomes therapeutic. She was started on Coumadin 5 mg by mouth daily yesterday. She is tolerating her anticoagulation fairly well. No bleeding issues, no bruises or ecchymosis. The patient denied having any significant chest pain or shortness of breath, no cough or hemoptysis. She is here today to resume her maintenance chemotherapy with Avastin. She is to have mild peripheral neuropathy and currently on treatment with Neurontin.  MEDICAL HISTORY: Past Medical History  Diagnosis Date  . Lung cancer 03/08/2011    recurrent  . Hypertension   . Hypothyroidism   . Hypercholesterolemia    . Glaucoma   . Hip pain 09/02/2011  . Foot pain 09/02/2011    ALLERGIES:  is allergic to amitriptyline; codeine; and sulfa antibiotics.  MEDICATIONS:  Current Outpatient Prescriptions  Medication Sig Dispense Refill  . citalopram (CELEXA) 40 MG tablet Take 40 mg by mouth daily.        . clonazePAM (KLONOPIN) 0.5 MG tablet Take 0.5 mg by mouth 2 (two) times daily. Per [name], NP       . dorzolamide-timolol (COSOPT) 22.3-6.8 MG/ML ophthalmic solution 2 drops 2 (two) times daily. Per [name], NP       . fondaparinux (ARIXTRA) 7.5 MG/0.6ML SOLN Inject 7.5 mg into the skin daily.      Marland Kitchen levothyroxine (SYNTHROID, LEVOTHROID) 50 MCG tablet Take 50 mcg by mouth 4 (four) times a week. Per [name], NP       . levothyroxine (SYNTHROID, LEVOTHROID) 75 MCG tablet Take 75 mcg by mouth 3 (three) times a week. Per [name], NP       . lidocaine-prilocaine (EMLA) cream Apply 1 application topically as needed.        Marland Kitchen lisinopril (PRINIVIL,ZESTRIL) 40 MG tablet Take 40 mg by mouth daily. Per [name], NP       . pregabalin (LYRICA) 75 MG capsule Take 1 capsule (75 mg total) by mouth 3 (three) times daily.  90 capsule  2  . prochlorperazine (COMPAZINE) 10 MG tablet Take 10 mg by mouth every 6 (six) hours as needed.        . rosuvastatin (CRESTOR) 10 MG tablet Take 10 mg by mouth daily.        Marland Kitchen  timolol (TIMOPTIC) 0.5 % ophthalmic solution       . warfarin (COUMADIN) 5 MG tablet Take 1 tablet (5 mg total) by mouth daily.  30 tablet  2  . amLODipine (NORVASC) 5 MG tablet Take 5 mg by mouth daily. Per [name], NP        No current facility-administered medications for this visit.   Facility-Administered Medications Ordered in Other Visits  Medication Dose Route Frequency Provider Last Rate Last Dose  . 0.9 %  sodium chloride infusion   Intravenous Once Madigan Rosensteel K. Knox Cervi, MD      . bevacizumab (AVASTIN) 900 mg in sodium chloride 0.9 % 100 mL chemo infusion  15 mg/kg  (Order-Specific) Intravenous Once Tekila Caillouet K. Nilesh Stegall, MD   900 mg at 11/06/11 1347  . fondaparinux (ARIXTRA) injection 7.5 mg  7.5 mg Subcutaneous Once Gerren Hoffmeier K. Dareth Andrew, MD   7.5 mg at 11/06/11 1429  . heparin lock flush 100 unit/mL  500 Units Intracatheter Once PRN Makayleigh Poliquin K. Berry Godsey, MD   500 Units at 11/06/11 1431  . sodium chloride 0.9 % injection 10 mL  10 mL Intracatheter PRN Terrion Poblano K. Rochel Privett, MD   10 mL at 11/06/11 1431    SURGICAL HISTORY:  Past Surgical History  Procedure Date  . Video assisted thoracoscopy 09/28/2009  . Thoracotomy 09/28/2009    mini  . Lung lobectomy 09/28/2009    right upper    REVIEW OF SYSTEMS:  A comprehensive review of systems was negative.   PHYSICAL EXAMINATION: General appearance: alert, cooperative and no distress Neck: no adenopathy Resp: clear to auscultation bilaterally Cardio: regular rate and rhythm, S1, S2 normal, no murmur, click, rub or gallop GI: soft, non-tender; bowel sounds normal; no masses,  no organomegaly Extremities: extremities normal, atraumatic, no cyanosis or edema Neurologic: Alert and oriented X 3, normal strength and tone. Normal symmetric reflexes. Normal coordination and gait  ECOG PERFORMANCE STATUS: 1 - Symptomatic but completely ambulatory  Blood pressure 155/81, pulse 69, temperature 96.5 F (35.8 C), temperature source Oral, weight 130 lb (58.968 kg).  LABORATORY DATA: Lab Results  Component Value Date   WBC 6.6 11/06/2011   HGB 12.4 11/06/2011   HCT 36.6 11/06/2011   MCV 89.5 11/06/2011   PLT 157 11/06/2011      Chemistry      Component Value Date/Time   NA 140 11/06/2011 1112   NA 142 03/08/2011 0754   K 3.7 11/06/2011 1112   K 4.2 03/08/2011 0754   CL 103 11/06/2011 1112   CL 102 03/08/2011 0754   CO2 28 11/06/2011 1112   CO2 27 03/08/2011 0754   BUN 7 11/06/2011 1112   BUN 9 03/08/2011 0754   CREATININE 0.91 11/06/2011 1112   CREATININE 1.0 03/08/2011 0754      Component Value Date/Time   CALCIUM 10.2  11/06/2011 1112   CALCIUM 9.5 03/08/2011 0754   ALKPHOS 83 11/06/2011 1112   ALKPHOS 59 03/08/2011 0754   AST 9 11/06/2011 1112   AST 19 03/08/2011 0754   ALT 5 11/06/2011 1112   BILITOT 0.3 11/06/2011 1112   BILITOT 0.50 03/08/2011 0754       RADIOGRAPHIC STUDIES: Ct Chest W Contrast  11/04/2011  *RADIOLOGY REPORT*  Clinical Data: 71 year old female with history of lung cancer diagnosed in 2010.  Chemotherapy in progress.  RECIST protocol.  CT CHEST WITH CONTRAST  Technique:  Multidetector CT imaging of the chest was performed following the standard protocol during bolus administration of intravenous contrast.  Contrast: 80mL OMNIPAQUE IOHEXOL 300 MG/ML IV SOLN  Comparison: CT of the thorax with contrast 09/18/2011.  Findings:  RECIST Protocol 1.1 Report:  Protocol 1.1 Lesions:  1. Peripheral right lower lobe lesion unchanged measuring 2.0 cm (image 42 of series 5).  2. Cystic appearing superior segment left lower lobe lesion unchanged measuring 4.4 cm (when measured on image 26 of series 5 using the previously demonstrated measurement techniques).  Mediastinum:  Heart size is normal.  No pericardial fluid, thickening or calcification.  No acute abnormality of the thoracic aorta or other great vessels of the mediastinum. A large filling defect is noted in the right lower lobe pulmonary artery extending into segmental and subsegmental branches of the right lower lobe. No other definite filling defects are noted on this examination (this was not specifically tailored for evaluation of pulmonary embolism).  There is atherosclerosis of the thoracic aorta, the great vessels of the mediastinum and the coronary arteries, including calcified atherosclerotic plaque in the left main and LAD coronary arteries. No pathologically enlarged mediastinal or hilar lymph nodes.  There are multiple calcified left hilar lymph nodes. The esophagus is normal in appearance.  Right internal jugular single lumen Port-A-Cath tip  terminating in the superior aspect of the right atrium.  Lungs/Pleura:  Status post right upper lobectomy. Large calcified granuloma in the lateral aspect the left upper lobe.  No consolidative airspace disease.  No pleural effusions. Again noted are multiple predominately ground-glass attenuation nodules scattered throughout the lungs bilaterally, several of these demonstrating internal areas of cystic change.  The largest lesion is centered in the superior segment of the left lower lobe, demonstrating predominately internal cystic change with periphery of ground-glass attenuation (total lesion including peripheral ground-glass attenuation measures approximately 5.5 x 3.8 cm).  9 mm ground-glass attenuation nodule medial basal segment right lower lobe (image 33 of series 5), unchanged.  1.6 cm ground-glass attenuation nodule left lower lobe (image 33 of series 5), unchanged.  1.8 cm pleural-based nodule and apex of left upper lobe (image 12 of series 5), unchanged. Please see additional measurements for RECIST protocol 1.1 lesions above. Multiple additional smaller ground-glass attenuation regions are all similar to prior studies.  No consolidative airspace disease.  No pleural effusions.  Upper Abdomen: Multiple calcifications seen scattered throughout the liver and spleen, consistent with granulomas. 5 mm low attenuation lesion in segment eight of the liver is too small to characterize, but unchanged compared to prior study, and statistically likely represent a small cyst or biliary hamartoma. Atherosclerosis.  Musculoskeletal:  No aggressive appearing lytic or blastic lesions are noted in the visualized portions of the skeleton.  IMPRESSION:  1. Acute pulmonary embolism in the right lower lobe lobar, segmental and subsegmental sized pulmonary arteries.  These results were called by telephone on 11/04/2011  at  12:30 p.m. to  P.A. Kathleen Argue, who verbally acknowledged these results.  2.  No significant  interval change in multiple ground-glass attenuation pulmonary nodules, including the index RECIST protocol 1.1 lesions, as detailed above when compared to prior examinations. 3.  No new or enlarging suspicious appearing pulmonary nodules otherwise identified. 4.  Status post right upper lobectomy. 5.  Atherosclerosis, including the left main and LAD coronary artery disease. Please note that although the presence of coronary artery calcium documents the presence of coronary artery disease, the severity of this disease and any potential stenosis cannot be assessed on this non-gated CT examination.  Assessment for potential risk factor modification, dietary therapy or pharmacologic therapy  may be warranted, if clinically indicated. 6.  Sequela of old granulomatous disease, as above.  Original Report Authenticated By: Florencia Reasons, M.D.    ASSESSMENT:  #1 metastatic non-small cell lung cancer, adenocarcinoma.  #2 acute pulmonary emboli involving the right lower lobe.  PLAN:  #1 metastatic non-small cell lung cancer: The patient has no evidence for disease progression and she still eligible to continue her maintenance treatment with Avastin. We will proceed with cycle #4 today as scheduled. #2 acute pulmonary embolus: The patient is currently on Arixtra for the next few days until her PT/INR becomes therapeutic. She will also continue on Coumadin 5 mg by mouth daily and her does will be adjusted by the Coumadin clinic. #3 the patient come back for followup visit in 3 weeks with the next cycle of her chemotherapy. She was advised to call me immediately if she has any concerning symptoms in the interval.  All questions were answered. The patient knows to call the clinic with any problems, questions or concerns. We can certainly see the patient much sooner if necessary.

## 2011-11-06 NOTE — Progress Notes (Signed)
Patient in to clinic today for evaluation and Cycle 5 of maintenance therapy. Based on lab results review and history and physical by Dr. Arbutus Ped, patient condition is acceptable for continued treatment. Per correspondence with ECOG study chair, patient may continue on maintenance therapy per protocol section 5.4.1.2 while receiving therapeutic anticoagulation for grade 3 venous thrombosis,

## 2011-11-07 ENCOUNTER — Telehealth: Payer: Self-pay | Admitting: Internal Medicine

## 2011-11-07 ENCOUNTER — Other Ambulatory Visit: Payer: Self-pay | Admitting: *Deleted

## 2011-11-07 ENCOUNTER — Ambulatory Visit (HOSPITAL_BASED_OUTPATIENT_CLINIC_OR_DEPARTMENT_OTHER): Payer: Medicare Other

## 2011-11-07 VITALS — BP 131/69 | HR 64 | Temp 97.4°F

## 2011-11-07 DIAGNOSIS — C3490 Malignant neoplasm of unspecified part of unspecified bronchus or lung: Secondary | ICD-10-CM

## 2011-11-07 DIAGNOSIS — I2699 Other pulmonary embolism without acute cor pulmonale: Secondary | ICD-10-CM

## 2011-11-07 MED ORDER — FONDAPARINUX SODIUM 7.5 MG/0.6ML ~~LOC~~ SOLN
7.5000 mg | Freq: Once | SUBCUTANEOUS | Status: AC
  Administered 2011-11-07: 7.5 mg via SUBCUTANEOUS
  Filled 2011-11-07: qty 0.6

## 2011-11-07 NOTE — Telephone Encounter (Signed)
gv pt appt schedule for jan/feb. °

## 2011-11-08 ENCOUNTER — Other Ambulatory Visit: Payer: Self-pay | Admitting: Internal Medicine

## 2011-11-08 ENCOUNTER — Ambulatory Visit: Payer: Medicare Other

## 2011-11-08 ENCOUNTER — Other Ambulatory Visit (HOSPITAL_BASED_OUTPATIENT_CLINIC_OR_DEPARTMENT_OTHER): Payer: Medicare Other | Admitting: Lab

## 2011-11-08 ENCOUNTER — Ambulatory Visit (HOSPITAL_BASED_OUTPATIENT_CLINIC_OR_DEPARTMENT_OTHER): Payer: Medicare Other

## 2011-11-08 VITALS — BP 129/74 | HR 60 | Temp 96.9°F

## 2011-11-08 DIAGNOSIS — Z5181 Encounter for therapeutic drug level monitoring: Secondary | ICD-10-CM

## 2011-11-08 DIAGNOSIS — I2699 Other pulmonary embolism without acute cor pulmonale: Secondary | ICD-10-CM

## 2011-11-08 DIAGNOSIS — Z7901 Long term (current) use of anticoagulants: Secondary | ICD-10-CM

## 2011-11-08 LAB — POCT INR: INR: 1.5

## 2011-11-08 LAB — PROTIME-INR
INR: 1.5 — ABNORMAL LOW (ref 2.00–3.50)
Protime: 18 Seconds — ABNORMAL HIGH (ref 10.6–13.4)

## 2011-11-08 MED ORDER — FONDAPARINUX SODIUM 7.5 MG/0.6ML ~~LOC~~ SOLN
7.5000 mg | Freq: Once | SUBCUTANEOUS | Status: AC
  Administered 2011-11-08: 7.5 mg via SUBCUTANEOUS
  Filled 2011-11-08: qty 0.6

## 2011-11-08 NOTE — Progress Notes (Unsigned)
Take Coumadin 7.5mg  (1&1/2 tablets) today only.  Continue Coumadin 5mg  (1 tablet) daily on 1/26 and 1/27. Continue Arixtra 7.5mg  daily.  11/09/11 Injection appointment = 1:30pm Self inject Arixtra 7.5mg  on Sunday, 11/10/11. Arixtra sample provided to patient for 11/10/11 administration. Self injection technique provided/discussed by injection RN today.  Technique will be reviewed again on 11/09/11 with RN. Return to clinic for repeat INR on 11/11/11 at 10am. Will discuss need for further injections/appointments (if needed) after INR result on 11/11/11.

## 2011-11-08 NOTE — Patient Instructions (Signed)
Take Coumadin 7.5mg  (1&1/2 tablets) today only.  Continue 5mg  (1 tablet) daily on 1/26 and 1/27. Continue Arixtra 7.5mg  daily.  11/09/11 Injection appointment = 1:30pm Self inject Arixtra 7.5mg  on Sunday, 11/10/11. Arixtra sample provided to patient for 11/10/11. Return to clinic for repeat INR on 11/11/11 at 10am.

## 2011-11-09 ENCOUNTER — Ambulatory Visit: Payer: Medicare Other

## 2011-11-09 ENCOUNTER — Telehealth: Payer: Self-pay | Admitting: Nurse Practitioner

## 2011-11-09 NOTE — Telephone Encounter (Signed)
Pt called and stated there was "too much ice" in her area to come in for injection.  Appt cancelled.

## 2011-11-11 ENCOUNTER — Ambulatory Visit: Payer: Medicare Other | Admitting: Pharmacist

## 2011-11-11 ENCOUNTER — Other Ambulatory Visit (HOSPITAL_BASED_OUTPATIENT_CLINIC_OR_DEPARTMENT_OTHER): Payer: Medicare Other

## 2011-11-11 ENCOUNTER — Ambulatory Visit: Payer: Medicare Other

## 2011-11-11 DIAGNOSIS — I2699 Other pulmonary embolism without acute cor pulmonale: Secondary | ICD-10-CM

## 2011-11-11 DIAGNOSIS — C3490 Malignant neoplasm of unspecified part of unspecified bronchus or lung: Secondary | ICD-10-CM

## 2011-11-11 LAB — POCT INR: INR: 2.6

## 2011-11-11 LAB — PROTIME-INR: INR: 2.6 (ref 2.00–3.50)

## 2011-11-11 NOTE — Progress Notes (Signed)
INR therapeutic today (2.6). Pt has not had any problems with bleeding or bruising.  Only complaint is upper arm soreness that makes it difficult for her to raise her arms above her head.  This may be related to her statin use.  I recommended that she call her prescribing MD to discuss these symptoms with him.  She may need to switch drugs or try a lower dose.   Will continue Coumadin at 5mg  daily except 7.5mg  on Fridays.  Recheck INR in 1 week.

## 2011-11-18 ENCOUNTER — Ambulatory Visit (HOSPITAL_BASED_OUTPATIENT_CLINIC_OR_DEPARTMENT_OTHER): Payer: Self-pay | Admitting: Pharmacist

## 2011-11-18 ENCOUNTER — Other Ambulatory Visit (HOSPITAL_BASED_OUTPATIENT_CLINIC_OR_DEPARTMENT_OTHER): Payer: Medicare Other | Admitting: Lab

## 2011-11-18 DIAGNOSIS — I2699 Other pulmonary embolism without acute cor pulmonale: Secondary | ICD-10-CM

## 2011-11-18 LAB — PROTIME-INR: INR: 4.6 — ABNORMAL HIGH (ref 2.00–3.50)

## 2011-11-18 LAB — POCT INR: INR: 4.6

## 2011-11-18 NOTE — Progress Notes (Signed)
INR supratherapeutic.  Asymptomatic. Will hold Coumadin dose tonight then decrease to 5 mg daily. Return next Wednesday w/ lab, appt w/ Adrena & infusion. Marily Lente, Pharm.D.

## 2011-11-19 ENCOUNTER — Other Ambulatory Visit: Payer: Self-pay | Admitting: Physician Assistant

## 2011-11-25 ENCOUNTER — Other Ambulatory Visit: Payer: Self-pay | Admitting: Pharmacist

## 2011-11-25 DIAGNOSIS — I2699 Other pulmonary embolism without acute cor pulmonale: Secondary | ICD-10-CM

## 2011-11-27 ENCOUNTER — Ambulatory Visit: Payer: Medicare Other | Admitting: Pharmacist

## 2011-11-27 ENCOUNTER — Encounter: Payer: Self-pay | Admitting: *Deleted

## 2011-11-27 ENCOUNTER — Ambulatory Visit: Payer: Medicare Other | Admitting: Physician Assistant

## 2011-11-27 ENCOUNTER — Encounter: Payer: Self-pay | Admitting: Physician Assistant

## 2011-11-27 ENCOUNTER — Ambulatory Visit (HOSPITAL_BASED_OUTPATIENT_CLINIC_OR_DEPARTMENT_OTHER): Payer: Medicare Other

## 2011-11-27 ENCOUNTER — Encounter: Payer: Self-pay | Admitting: Pharmacist

## 2011-11-27 ENCOUNTER — Other Ambulatory Visit (HOSPITAL_BASED_OUTPATIENT_CLINIC_OR_DEPARTMENT_OTHER): Payer: Medicare Other | Admitting: Lab

## 2011-11-27 VITALS — BP 144/77 | HR 62 | Temp 97.8°F

## 2011-11-27 VITALS — BP 134/71 | HR 64 | Temp 97.3°F | Ht 65.0 in | Wt 130.6 lb

## 2011-11-27 DIAGNOSIS — R5381 Other malaise: Secondary | ICD-10-CM

## 2011-11-27 DIAGNOSIS — I2699 Other pulmonary embolism without acute cor pulmonale: Secondary | ICD-10-CM

## 2011-11-27 DIAGNOSIS — C3490 Malignant neoplasm of unspecified part of unspecified bronchus or lung: Secondary | ICD-10-CM

## 2011-11-27 DIAGNOSIS — R040 Epistaxis: Secondary | ICD-10-CM

## 2011-11-27 DIAGNOSIS — R5383 Other fatigue: Secondary | ICD-10-CM

## 2011-11-27 DIAGNOSIS — Z5181 Encounter for therapeutic drug level monitoring: Secondary | ICD-10-CM

## 2011-11-27 DIAGNOSIS — Z7901 Long term (current) use of anticoagulants: Secondary | ICD-10-CM

## 2011-11-27 DIAGNOSIS — Z5112 Encounter for antineoplastic immunotherapy: Secondary | ICD-10-CM

## 2011-11-27 LAB — LACTATE DEHYDROGENASE: LDH: 178 U/L (ref 94–250)

## 2011-11-27 LAB — COMPREHENSIVE METABOLIC PANEL
BUN: 11 mg/dL (ref 6–23)
CO2: 30 mEq/L (ref 19–32)
Creatinine, Ser: 0.96 mg/dL (ref 0.50–1.10)
Glucose, Bld: 87 mg/dL (ref 70–99)
Total Bilirubin: 0.2 mg/dL — ABNORMAL LOW (ref 0.3–1.2)

## 2011-11-27 LAB — CBC WITH DIFFERENTIAL/PLATELET
BASO%: 0.5 % (ref 0.0–2.0)
HCT: 38.8 % (ref 34.8–46.6)
LYMPH%: 30.5 % (ref 14.0–49.7)
MCHC: 33.2 g/dL (ref 31.5–36.0)
MONO#: 0.3 10*3/uL (ref 0.1–0.9)
NEUT%: 61.6 % (ref 38.4–76.8)
Platelets: 139 10*3/uL — ABNORMAL LOW (ref 145–400)
WBC: 5.8 10*3/uL (ref 3.9–10.3)

## 2011-11-27 LAB — POCT INR: INR: 4.5

## 2011-11-27 LAB — UA PROTEIN, DIPSTICK - CHCC: Protein, Urine: NEGATIVE mg/dL

## 2011-11-27 LAB — MAGNESIUM: Magnesium: 2.1 mg/dL (ref 1.5–2.5)

## 2011-11-27 MED ORDER — SODIUM CHLORIDE 0.9 % IV SOLN
Freq: Once | INTRAVENOUS | Status: AC
  Administered 2011-11-27: 12:00:00 via INTRAVENOUS

## 2011-11-27 MED ORDER — SODIUM CHLORIDE 0.9 % IJ SOLN
10.0000 mL | INTRAMUSCULAR | Status: DC | PRN
  Administered 2011-11-27: 10 mL
  Filled 2011-11-27: qty 10

## 2011-11-27 MED ORDER — HEPARIN SOD (PORK) LOCK FLUSH 100 UNIT/ML IV SOLN
500.0000 [IU] | Freq: Once | INTRAVENOUS | Status: AC | PRN
  Administered 2011-11-27: 500 [IU]
  Filled 2011-11-27: qty 5

## 2011-11-27 MED ORDER — SODIUM CHLORIDE 0.9 % IV SOLN
15.0000 mg/kg | Freq: Once | INTRAVENOUS | Status: AC
  Administered 2011-11-27: 900 mg via INTRAVENOUS
  Filled 2011-11-27: qty 36

## 2011-11-27 NOTE — Progress Notes (Signed)
Addended by: Amaryllis Dyke on: 11/27/2011 01:29 PM   Modules accepted: Level of Service

## 2011-11-27 NOTE — Progress Notes (Signed)
INR supratherapeutic still despite holding dose x 1 last week & taking less overall than when her INR was previously therapeutic. Small amt of blood from nose this morning only. Hold x 1 today then 5 mg/day; 2.5 mg Fri/Mon. Return 1 week. Marily Lente, Pharm.D.

## 2011-11-27 NOTE — Progress Notes (Signed)
11/27/2011 2:09pm Patient in to clinic this morning for evaluation prior to receiving Cycle 6 treatment. Patient's primary complaint is fatigue, per provider, likely multifactorial, but will refer to her endocrinologist for thyroid evaluation. Patient has had very mild blood-tinged drainage from nose this morning, but no other bleeding abnormalities to report. Based on physical exam and lab results review by Tiana Loft PA, patient condition is acceptable for treatment today. Sign for infusion given to Reesa Chew RN.

## 2011-11-27 NOTE — Progress Notes (Signed)
Patient received 50 mls of normal saline flush after avastin infusion.

## 2011-11-27 NOTE — Progress Notes (Signed)
Per Tiana Loft, PA- Lyrica increased to 150 mg TID. Also sending pt to endocrinologist to check TSH.  Pt c/o fatigue. Marily Lente, Pharm.D.

## 2011-11-28 ENCOUNTER — Telehealth: Payer: Self-pay | Admitting: Internal Medicine

## 2011-11-28 ENCOUNTER — Ambulatory Visit: Payer: Medicare Other

## 2011-11-28 NOTE — Progress Notes (Signed)
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 This was initially diagnosed as stage IB (T2a N0 M0) well-differentiated adenocarcinoma of bronchioalveolar type in December 2010.  #2 Acute pulmonary embolism in the right lower lobe lobar, segmental and subsegmental sized pulmonary arteries, diagnosed on 11/04/2011.   PRIOR THERAPY:  1.Status post right upper lobectomy on September 28, 2009 under the care of Dr. Edwyna Shell. The patient refused adjuvant chemotherapy at that time. She had disease recurrence in June of 2012.  2.Status post 4 cycles of systemic chemotherapy with carboplatin for an AUC of 6 and paclitaxel at 200 mg per meter squared and Avastin at 15 mg/kg according to the ECOG protocol #5508.   CURRENT THERAPY: Chemotherapy with Avastin 15 mg/kg according to the ECOG protocol #5508 status post 5 cycles.   INTERVAL HISTORY: [name] 71 y.o. female returns to the clinic today for followup visit. She complains of having no energy. She sees an endocrinologist for her hypothyroidism but has not seen her in a little over year. She continues to have difficulty with peripheral neuropathy affecting her feet and hands. She has been dropping things. She's tolerating her Coumadin therapy relatively well however this morning had some mild nosebleeds affecting the right nares in the drainage is more pinkish tinge as opposed to an outright episode of epistaxis. The patient denied having any significant chest pain or shortness of breath, no cough or hemoptysis. She is here today to resume her maintenance chemotherapy with Avastin. She is to have mild peripheral neuropathy and currently on treatment with Lyrica at 75 mg by mouth three times a day. She just had her Lyrica prescription refilled.  MEDICAL HISTORY: Past Medical History  Diagnosis Date  . Lung cancer  03/08/2011    recurrent  . Hypertension   . Hypothyroidism   . Hypercholesterolemia   . Glaucoma   . Hip pain 09/02/2011  . Foot pain 09/02/2011    ALLERGIES:  is allergic to amitriptyline; codeine; and sulfa antibiotics.  MEDICATIONS:  Current Outpatient Prescriptions  Medication Sig Dispense Refill  . amLODipine (NORVASC) 5 MG tablet Take 5 mg by mouth daily. Per [name], NP       . citalopram (CELEXA) 40 MG tablet Take 40 mg by mouth daily.        . clonazePAM (KLONOPIN) 0.5 MG tablet Take 0.5 mg by mouth 2 (two) times daily as needed. Per [name], NP      . dorzolamide-timolol (COSOPT) 22.3-6.8 MG/ML ophthalmic solution 2 drops 2 (two) times daily. Per [name], NP       . levothyroxine (SYNTHROID, LEVOTHROID) 50 MCG tablet Take 50 mcg by mouth 4 (four) times a week. Per [name], NP       . levothyroxine (SYNTHROID, LEVOTHROID) 75 MCG tablet Take 75 mcg by mouth 3 (three) times a week. Per [name], NP       . lidocaine-prilocaine (EMLA) cream Apply 1 application topically as needed.        Marland Kitchen lisinopril (PRINIVIL,ZESTRIL) 40 MG tablet Take 40 mg by mouth daily. Per [name], NP       . pregabalin (LYRICA) 75 MG capsule Take 1 capsule (75 mg total) by mouth 3 (three) times daily.  90 capsule  2  . prochlorperazine (COMPAZINE) 10 MG tablet Take 10 mg by mouth every 6 (six) hours as needed.        Marland Kitchen  rosuvastatin (CRESTOR) 10 MG tablet Take 10 mg by mouth daily.        Marland Kitchen warfarin (COUMADIN) 5 MG tablet Take 1 tablet (5 mg total) by mouth daily.  30 tablet  2   No current facility-administered medications for this visit.   Facility-Administered Medications Ordered in Other Visits  Medication Dose Route Frequency Provider Last Rate Last Dose  . DISCONTD: sodium chloride 0.9 % injection 10 mL  10 mL Intracatheter PRN Tiana Loft, PA   10 mL at 11/27/11 1345    SURGICAL HISTORY:  Past Surgical History  Procedure Date  . Video assisted thoracoscopy  09/28/2009  . Thoracotomy 09/28/2009    mini  . Lung lobectomy 09/28/2009    right upper    REVIEW OF SYSTEMS:  A comprehensive review of systems was negative except for: Constitutional: positive for fatigue Ears, nose, mouth, throat, and face: positive for epistaxis Neurological: positive for paresthesia   PHYSICAL EXAMINATION: General appearance: alert, cooperative and no distress Neck: no adenopathy Resp: clear to auscultation bilaterally Cardio: regular rate and rhythm, S1, S2 normal, no murmur, click, rub or gallop GI: soft, non-tender; bowel sounds normal; no masses,  no organomegaly Extremities: extremities normal, atraumatic, no cyanosis or edema Neurologic: Alert and oriented X 3, normal strength and tone. Normal symmetric reflexes. Normal coordination and gait  ECOG PERFORMANCE STATUS: 1 - Symptomatic but completely ambulatory  Blood pressure 134/71, pulse 64, temperature 97.3 F (36.3 C), temperature source Oral, height 5\' 5"  (1.651 m), weight 130 lb 9.6 oz (59.24 kg).  LABORATORY DATA: Lab Results  Component Value Date   WBC 5.8 11/27/2011   HGB 12.9 11/27/2011   HCT 38.8 11/27/2011   MCV 89.6 11/27/2011   PLT 139* 11/27/2011      Chemistry      Component Value Date/Time   NA 141 11/27/2011 1029   NA 142 03/08/2011 0754   K 4.2 11/27/2011 1029   K 4.2 03/08/2011 0754   CL 103 11/27/2011 1029   CL 102 03/08/2011 0754   CO2 30 11/27/2011 1029   CO2 27 03/08/2011 0754   BUN 11 11/27/2011 1029   BUN 9 03/08/2011 0754   CREATININE 0.96 11/27/2011 1029   CREATININE 1.0 03/08/2011 0754      Component Value Date/Time   CALCIUM 10.3 11/27/2011 1029   CALCIUM 9.5 03/08/2011 0754   ALKPHOS 75 11/27/2011 1029   ALKPHOS 59 03/08/2011 0754   AST 10 11/27/2011 1029   AST 19 03/08/2011 0754   ALT 6 11/27/2011 1029   BILITOT 0.2* 11/27/2011 1029   BILITOT 0.50 03/08/2011 0754       RADIOGRAPHIC STUDIES: Ct Chest W Contrast  11/04/2011  *RADIOLOGY REPORT*  Clinical Data: 71 year old  female with history of lung cancer diagnosed in 2010.  Chemotherapy in progress.  RECIST protocol.  CT CHEST WITH CONTRAST  Technique:  Multidetector CT imaging of the chest was performed following the standard protocol during bolus administration of intravenous contrast.  Contrast: 80mL OMNIPAQUE IOHEXOL 300 MG/ML IV SOLN  Comparison: CT of the thorax with contrast 09/18/2011.  Findings:  RECIST Protocol 1.1 Report:  Protocol 1.1 Lesions:  1. Peripheral right lower lobe lesion unchanged measuring 2.0 cm (image 42 of series 5).  2. Cystic appearing superior segment left lower lobe lesion unchanged measuring 4.4 cm (when measured on image 26 of series 5 using the previously demonstrated measurement techniques).  Mediastinum:  Heart size is normal.  No pericardial fluid, thickening or calcification.  No  acute abnormality of the thoracic aorta or other great vessels of the mediastinum. A large filling defect is noted in the right lower lobe pulmonary artery extending into segmental and subsegmental branches of the right lower lobe. No other definite filling defects are noted on this examination (this was not specifically tailored for evaluation of pulmonary embolism).  There is atherosclerosis of the thoracic aorta, the great vessels of the mediastinum and the coronary arteries, including calcified atherosclerotic plaque in the left main and LAD coronary arteries. No pathologically enlarged mediastinal or hilar lymph nodes.  There are multiple calcified left hilar lymph nodes. The esophagus is normal in appearance.  Right internal jugular single lumen Port-A-Cath tip terminating in the superior aspect of the right atrium.  Lungs/Pleura:  Status post right upper lobectomy. Large calcified granuloma in the lateral aspect the left upper lobe.  No consolidative airspace disease.  No pleural effusions. Again noted are multiple predominately ground-glass attenuation nodules scattered throughout the lungs bilaterally, several  of these demonstrating internal areas of cystic change.  The largest lesion is centered in the superior segment of the left lower lobe, demonstrating predominately internal cystic change with periphery of ground-glass attenuation (total lesion including peripheral ground-glass attenuation measures approximately 5.5 x 3.8 cm).  9 mm ground-glass attenuation nodule medial basal segment right lower lobe (image 33 of series 5), unchanged.  1.6 cm ground-glass attenuation nodule left lower lobe (image 33 of series 5), unchanged.  1.8 cm pleural-based nodule and apex of left upper lobe (image 12 of series 5), unchanged. Please see additional measurements for RECIST protocol 1.1 lesions above. Multiple additional smaller ground-glass attenuation regions are all similar to prior studies.  No consolidative airspace disease.  No pleural effusions.  Upper Abdomen: Multiple calcifications seen scattered throughout the liver and spleen, consistent with granulomas. 5 mm low attenuation lesion in segment eight of the liver is too small to characterize, but unchanged compared to prior study, and statistically likely represent a small cyst or biliary hamartoma. Atherosclerosis.  Musculoskeletal:  No aggressive appearing lytic or blastic lesions are noted in the visualized portions of the skeleton.  IMPRESSION:  1. Acute pulmonary embolism in the right lower lobe lobar, segmental and subsegmental sized pulmonary arteries.  These results were called by telephone on 11/04/2011  at  12:30 p.m. to  P.A. Kathleen Argue, who verbally acknowledged these results.  2.  No significant interval change in multiple ground-glass attenuation pulmonary nodules, including the index RECIST protocol 1.1 lesions, as detailed above when compared to prior examinations. 3.  No new or enlarging suspicious appearing pulmonary nodules otherwise identified. 4.  Status post right upper lobectomy. 5.  Atherosclerosis, including the left main and LAD coronary  artery disease. Please note that although the presence of coronary artery calcium documents the presence of coronary artery disease, the severity of this disease and any potential stenosis cannot be assessed on this non-gated CT examination.  Assessment for potential risk factor modification, dietary therapy or pharmacologic therapy may be warranted, if clinically indicated. 6.  Sequela of old granulomatous disease, as above.  Original Report Authenticated By: Florencia Reasons, M.D.    ASSESSMENT:  #1 metastatic non-small cell lung cancer, adenocarcinoma.  #2 acute pulmonary emboli involving the right lower lobe.  PLAN:  #1 metastatic non-small cell lung cancer: The patient has no evidence for disease progression and she still eligible to continue her maintenance treatment with Avastin. We will proceed with cycle #6 today as scheduled. #2 acute pulmonary embolus: The  patient is currently on  Coumadin as directed by the Coumadin clinic. Her INR is supratherapeutic today and likely accounts for her mild epistaxis. #3 patient was discussed with Dr. Arbutus Ped. Regarding her extreme fatigue since his been a while since her thyroid status is been reevaluated we will refer her back to her endocrinologist for further evaluation and management. #4 the patient come back for followup visit in 3 weeks with the next cycle of her chemotherapy. She was advised to call me immediately if she has any concerning symptoms in the interval. She'll have restaging CT scan of the chest on 11/2711 for followup with Dr. Arbutus Ped on 12/18/11 prior to her next scheduled cycle of maintenance chemotherapy. #5 for her peripheral neuropathy we will increase her Lyrica 150 mg by mouth twice daily  All questions were answered. The patient knows to call the clinic with any problems, questions or concerns. We can certainly see the patient much sooner if necessary.    Tiana Loft E,PA-C

## 2011-11-28 NOTE — Telephone Encounter (Signed)
Talked to pt, she is aware of appt to see Dr Everardo All, endocrinologist, tomorrow 11/29/11

## 2011-11-29 ENCOUNTER — Other Ambulatory Visit (INDEPENDENT_AMBULATORY_CARE_PROVIDER_SITE_OTHER): Payer: Medicare Other

## 2011-11-29 ENCOUNTER — Encounter: Payer: Self-pay | Admitting: Endocrinology

## 2011-11-29 ENCOUNTER — Ambulatory Visit (INDEPENDENT_AMBULATORY_CARE_PROVIDER_SITE_OTHER): Payer: Medicare Other | Admitting: Endocrinology

## 2011-11-29 DIAGNOSIS — R209 Unspecified disturbances of skin sensation: Secondary | ICD-10-CM

## 2011-11-29 DIAGNOSIS — E890 Postprocedural hypothyroidism: Secondary | ICD-10-CM

## 2011-11-29 DIAGNOSIS — R200 Anesthesia of skin: Secondary | ICD-10-CM | POA: Insufficient documentation

## 2011-11-29 DIAGNOSIS — E042 Nontoxic multinodular goiter: Secondary | ICD-10-CM | POA: Insufficient documentation

## 2011-11-29 LAB — TSH: TSH: 2.85 u[IU]/mL (ref 0.35–5.50)

## 2011-11-29 LAB — VITAMIN B12: Vitamin B-12: 182 pg/mL — ABNORMAL LOW (ref 211–911)

## 2011-11-29 NOTE — Progress Notes (Signed)
Subjective:    Patient ID: [name], female    DOB: 14-Feb-1941, 71 y.o.   MRN: [id number]  HPI Pt ha i-131 rx for hyperthyroidism, due to a multinodular goiter, in 2004.  She has been on synthroid since then.  Pt reports few mos of moderate numbness of the feet and hands, and assoc pain.   For her lung cancer, she has had surgery and chemotx, but not xrt. Past Medical History  Diagnosis Date  . Lung cancer 03/08/2011    recurrent  . Hypertension   . Hypothyroidism   . Hypercholesterolemia   . Glaucoma   . Hip pain 09/02/2011  . Foot pain 09/02/2011    Past Surgical History  Procedure Date  . Video assisted thoracoscopy 09/28/2009  . Thoracotomy 09/28/2009    mini  . Lung lobectomy 09/28/2009    right upper    History   Social History  . Marital Status: Widowed    Spouse Name: N/A    Number of Children: N/A  . Years of Education: N/A   Occupational History  . Not on file.   Social History Main Topics  . Smoking status: Former Smoker -- 1.0 packs/day for 50 years    Quit date: 09/28/2009  . Smokeless tobacco: Not on file  . Alcohol Use:   . Drug Use:   . Sexually Active:    Other Topics Concern  . Not on file   Social History Narrative  . No narrative on file    Current Outpatient Prescriptions on File Prior to Visit  Medication Sig Dispense Refill  . amLODipine (NORVASC) 5 MG tablet Take 5 mg by mouth daily. Per [name], NP       . citalopram (CELEXA) 40 MG tablet Take 40 mg by mouth daily.        . clonazePAM (KLONOPIN) 0.5 MG tablet Take 0.5 mg by mouth 2 (two) times daily as needed. Per [name], NP      . dorzolamide-timolol (COSOPT) 22.3-6.8 MG/ML ophthalmic solution 2 drops 2 (two) times daily. Per [name], NP       . levothyroxine (SYNTHROID, LEVOTHROID) 50 MCG tablet Take 50 mcg by mouth 4 (four) times a week. Per [name], NP       . levothyroxine (SYNTHROID, LEVOTHROID) 75 MCG tablet Take 75 mcg by mouth 3 (three)  times a week. Per [name], NP       . lidocaine-prilocaine (EMLA) cream Apply 1 application topically as needed.        Marland Kitchen lisinopril (PRINIVIL,ZESTRIL) 40 MG tablet Take 40 mg by mouth daily. Per [name], NP       . pregabalin (LYRICA) 75 MG capsule Take 1 capsule (75 mg total) by mouth 3 (three) times daily.  90 capsule  2  . prochlorperazine (COMPAZINE) 10 MG tablet Take 10 mg by mouth every 6 (six) hours as needed.        . rosuvastatin (CRESTOR) 10 MG tablet Take 10 mg by mouth daily.        Marland Kitchen warfarin (COUMADIN) 5 MG tablet Take 1 tablet (5 mg total) by mouth daily.  30 tablet  2    Allergies  Allergen Reactions  . Amitriptyline     Initial reaction about 25 years ago.  . Codeine   . Sulfa Antibiotics     No family history on file. No thyroid probs in immediate family BP 130/76  Pulse 69  Temp(Src) 97.1 F (36.2 C) (Oral)  Ht 5\' 4"  (1.626 m)  Wt 132 lb (59.875 kg)  BMI 22.66 kg/m2  SpO2 95%  Review of Systems denies constipation, seizure, blurry vision, and syncope.  She has myalgias, dry skin, rhinorrhea, depression, doe, weight loss, memory loss, and muscle cramps.  Hair loss is resolving.  She attributes easy bruising to coumadin rx.      Objective:   Physical Exam VS: see vs page GEN: no distress HEAD: head: no deformity eyes: no periorbital swelling, no proptosis external nose and ears are normal mouth: no lesion seen NECK: i do not appreciate any thyroid nodule or enlargement.   CHEST WALL: no deformity LUNGS:  Clear to auscultation CV: reg rate and rhythm, no murmur ABD: abdomen is soft, nontender.  no hepatosplenomegaly.  not distended.  no hernia MUSCULOSKELETAL: muscle bulk and strength are grossly normal.  no obvious joint swelling.  gait is normal and steady EXTEMITIES: no deformity of the hands.  no edema of the legs. PULSES: dorsalis pedis intact bilat.   NEURO:  cn 2-12 grossly intact.   readily moves all 4's.  sensation is intact to  touch on the feet, but decreased from normal SKIN:  Normal texture and temperature.  No rash or suspicious lesion is visible.   NODES:  None palpable at the neck PSYCH: alert, oriented x3.  Does not appear anxious nor depressed.    Lab Results  Component Value Date   TSH 2.85 11/29/2011  '    Assessment & Plan:  Post-i-131 hypothyroidism, well-replaced Multinodular goiter.  She has high risk for recurrent hyperthyroidism B-12 deficiency, new Numbness, uncertain if related to b-12

## 2011-11-29 NOTE — Patient Instructions (Addendum)
blood tests are being requested for you today.  please call [phone] to hear your test results.  You will be prompted to enter the 9-digit "MRN" number that appears at the top left of this page, followed by #.  Then you will hear the message. most of the time, a "lumpy thyroid" will eventually become overactive again.  this is usually a slow process, happening over the span of many years.  If this happens, you would first notice you need less thyroid medication. Please come back for a follow-up appointment in 6 months. (update: i left message on phone-tree:  Start b-12 1 g q month)

## 2011-12-02 ENCOUNTER — Other Ambulatory Visit: Payer: Self-pay | Admitting: Physician Assistant

## 2011-12-02 ENCOUNTER — Other Ambulatory Visit: Payer: Self-pay | Admitting: *Deleted

## 2011-12-02 ENCOUNTER — Other Ambulatory Visit: Payer: Self-pay | Admitting: Internal Medicine

## 2011-12-02 DIAGNOSIS — D539 Nutritional anemia, unspecified: Secondary | ICD-10-CM

## 2011-12-02 NOTE — Progress Notes (Signed)
Pt called stating that she saw Dr Everardo All and her B12 level is low.  She wanted to see if Dr Donnald Garre would oversee her getting B12 injections.  Per Dr Donnald Garre, okay to give monthly B12 injections.  Will make appt on 2/20 with lab and coumadin clinic.  Onc tx schedule filled out.  SLJ

## 2011-12-03 ENCOUNTER — Other Ambulatory Visit: Payer: Self-pay | Admitting: Pharmacist

## 2011-12-04 ENCOUNTER — Ambulatory Visit (HOSPITAL_BASED_OUTPATIENT_CLINIC_OR_DEPARTMENT_OTHER): Payer: Medicare Other

## 2011-12-04 ENCOUNTER — Ambulatory Visit: Payer: Medicare Other | Admitting: Pharmacist

## 2011-12-04 ENCOUNTER — Other Ambulatory Visit: Payer: Medicare Other | Admitting: Lab

## 2011-12-04 VITALS — BP 158/78 | HR 92 | Temp 98.5°F

## 2011-12-04 DIAGNOSIS — D539 Nutritional anemia, unspecified: Secondary | ICD-10-CM

## 2011-12-04 DIAGNOSIS — I2699 Other pulmonary embolism without acute cor pulmonale: Secondary | ICD-10-CM

## 2011-12-04 LAB — PROTIME-INR: INR: 4.2 — ABNORMAL HIGH (ref 2.00–3.50)

## 2011-12-04 MED ORDER — CYANOCOBALAMIN 1000 MCG/ML IJ SOLN
1000.0000 ug | Freq: Once | INTRAMUSCULAR | Status: AC
  Administered 2011-12-04: 1000 ug via INTRAMUSCULAR

## 2011-12-04 NOTE — Progress Notes (Signed)
INR supratherapeutic today (4.2).  She misunderstood last week's instructions, and took 5mg  on Monday instead of 2.5mg .  Otherwise, she took her Coumadin as instructed.  No problems with bleeding or bruising.  Will hold today's dose, then resume on Thursday with 5mg  daily except 2.5mg  on Monday, Thursday, and Saturday.  Recheck INR in 1 week.

## 2011-12-10 ENCOUNTER — Telehealth: Payer: Self-pay | Admitting: Internal Medicine

## 2011-12-10 NOTE — Telephone Encounter (Signed)
Pt called to confirm appointments for this week.

## 2011-12-11 ENCOUNTER — Other Ambulatory Visit: Payer: Medicare Other | Admitting: Lab

## 2011-12-11 ENCOUNTER — Ambulatory Visit (HOSPITAL_BASED_OUTPATIENT_CLINIC_OR_DEPARTMENT_OTHER): Payer: Medicare Other | Admitting: Pharmacist

## 2011-12-11 ENCOUNTER — Ambulatory Visit (HOSPITAL_COMMUNITY)
Admission: RE | Admit: 2011-12-11 | Discharge: 2011-12-11 | Disposition: A | Discharge status: Home / Self Care | Payer: Medicare Other | Source: Ambulatory Visit | Attending: Physician Assistant | Admitting: Physician Assistant

## 2011-12-11 DIAGNOSIS — J984 Other disorders of lung: Secondary | ICD-10-CM | POA: Insufficient documentation

## 2011-12-11 DIAGNOSIS — I2510 Atherosclerotic heart disease of native coronary artery without angina pectoris: Secondary | ICD-10-CM | POA: Insufficient documentation

## 2011-12-11 DIAGNOSIS — I2699 Other pulmonary embolism without acute cor pulmonale: Secondary | ICD-10-CM

## 2011-12-11 DIAGNOSIS — E041 Nontoxic single thyroid nodule: Secondary | ICD-10-CM | POA: Insufficient documentation

## 2011-12-11 DIAGNOSIS — C3490 Malignant neoplasm of unspecified part of unspecified bronchus or lung: Secondary | ICD-10-CM | POA: Insufficient documentation

## 2011-12-11 DIAGNOSIS — I700 Atherosclerosis of aorta: Secondary | ICD-10-CM | POA: Insufficient documentation

## 2011-12-11 LAB — PROTIME-INR: INR: 1.7 — ABNORMAL LOW (ref 2.00–3.50)

## 2011-12-11 MED ORDER — IOHEXOL 300 MG/ML  SOLN
80.0000 mL | Freq: Once | INTRAMUSCULAR | Status: AC | PRN
  Administered 2011-12-11: 80 mL via INTRAVENOUS

## 2011-12-11 NOTE — Progress Notes (Signed)
INR today slightly subtherapeutic (1.7) after being persistently supratherapeutic for multiple weeks.  Over the last 7 days, she skipped one dose as instructed, then took 2.5mg  alternating with 5mg  for 6 days.  No changes to meds or diet noted.  No problems with bleeding or bruising besides some small bruises noted on her upper thigh.  Moving forward, will have the patient take 5mg  daily except 2.5mg  on Monday, Thursday, and Saturday.  Pt communicated understanding.  Will recheck INR in 1 week with next MD visit.

## 2011-12-11 NOTE — Telephone Encounter (Signed)
Confirmed appts with pt

## 2011-12-12 ENCOUNTER — Ambulatory Visit: Payer: Medicare Other | Admitting: Physician Assistant

## 2011-12-12 ENCOUNTER — Other Ambulatory Visit: Payer: Self-pay | Admitting: *Deleted

## 2011-12-12 DIAGNOSIS — C3490 Malignant neoplasm of unspecified part of unspecified bronchus or lung: Secondary | ICD-10-CM

## 2011-12-18 ENCOUNTER — Ambulatory Visit: Payer: Medicare Other | Admitting: Internal Medicine

## 2011-12-18 ENCOUNTER — Other Ambulatory Visit: Payer: Self-pay | Admitting: *Deleted

## 2011-12-18 ENCOUNTER — Other Ambulatory Visit: Payer: Medicare Other | Admitting: Lab

## 2011-12-18 ENCOUNTER — Ambulatory Visit (HOSPITAL_BASED_OUTPATIENT_CLINIC_OR_DEPARTMENT_OTHER): Payer: Medicare Other | Admitting: Pharmacist

## 2011-12-18 ENCOUNTER — Ambulatory Visit (HOSPITAL_BASED_OUTPATIENT_CLINIC_OR_DEPARTMENT_OTHER): Payer: Medicare Other

## 2011-12-18 VITALS — BP 144/79 | HR 67 | Temp 97.7°F

## 2011-12-18 VITALS — BP 147/80 | HR 60 | Temp 96.7°F | Ht 64.0 in | Wt 133.0 lb

## 2011-12-18 DIAGNOSIS — C3490 Malignant neoplasm of unspecified part of unspecified bronchus or lung: Secondary | ICD-10-CM

## 2011-12-18 DIAGNOSIS — Z5112 Encounter for antineoplastic immunotherapy: Secondary | ICD-10-CM

## 2011-12-18 DIAGNOSIS — I2699 Other pulmonary embolism without acute cor pulmonale: Secondary | ICD-10-CM

## 2011-12-18 LAB — COMPREHENSIVE METABOLIC PANEL
ALT: 5 U/L (ref 0–35)
CO2: 26 mEq/L (ref 19–32)
Calcium: 10 mg/dL (ref 8.4–10.5)
Chloride: 100 mEq/L (ref 96–112)
Glucose, Bld: 82 mg/dL (ref 70–99)
Sodium: 136 mEq/L (ref 135–145)
Total Protein: 7.4 g/dL (ref 6.0–8.3)

## 2011-12-18 LAB — CBC WITH DIFFERENTIAL/PLATELET
BASO%: 0.3 % (ref 0.0–2.0)
Eosinophils Absolute: 0.1 10*3/uL (ref 0.0–0.5)
HCT: 39.6 % (ref 34.8–46.6)
MCHC: 33 g/dL (ref 31.5–36.0)
MONO#: 0.4 10*3/uL (ref 0.1–0.9)
NEUT#: 4.3 10*3/uL (ref 1.5–6.5)
NEUT%: 63.5 % (ref 38.4–76.8)
RBC: 4.48 10*6/uL (ref 3.70–5.45)
WBC: 6.7 10*3/uL (ref 3.9–10.3)
lymph#: 2 10*3/uL (ref 0.9–3.3)

## 2011-12-18 LAB — UA PROTEIN, DIPSTICK - CHCC: Protein, ur: NEGATIVE mg/dL

## 2011-12-18 LAB — POCT INR: INR: 1.8

## 2011-12-18 MED ORDER — SODIUM CHLORIDE 0.9 % IV SOLN
Freq: Once | INTRAVENOUS | Status: AC
  Administered 2011-12-18: 14:00:00 via INTRAVENOUS

## 2011-12-18 MED ORDER — HEPARIN SOD (PORK) LOCK FLUSH 100 UNIT/ML IV SOLN
500.0000 [IU] | Freq: Once | INTRAVENOUS | Status: AC | PRN
  Administered 2011-12-18: 500 [IU]
  Filled 2011-12-18: qty 5

## 2011-12-18 MED ORDER — SODIUM CHLORIDE 0.9 % IJ SOLN
10.0000 mL | INTRAMUSCULAR | Status: DC | PRN
  Administered 2011-12-18: 10 mL
  Filled 2011-12-18: qty 10

## 2011-12-18 MED ORDER — SODIUM CHLORIDE 0.9 % IV SOLN
15.0000 mg/kg | Freq: Once | INTRAVENOUS | Status: AC
  Administered 2011-12-18: 900 mg via INTRAVENOUS
  Filled 2011-12-18: qty 36

## 2011-12-18 MED ORDER — SODIUM CHLORIDE 0.9 % IV SOLN
Freq: Once | INTRAVENOUS | Status: AC
  Administered 2011-12-18: 50 mL via INTRAVENOUS

## 2011-12-18 NOTE — Progress Notes (Signed)
Patient in to clinic with daughter, Asencion Islam, today for evaluation and maintenance cycle 7. Based on lab results review and history and physical by Dr. Arbutus Ped, patient condition is acceptable for continued treatment. Sign for infusion given to Burnard Bunting RN.

## 2011-12-18 NOTE — Patient Instructions (Signed)
Take 2.5 mg on Saturday and Monday.  Take 5 mg other days. Return on 12/25/11 at 11:00 am for lab; 11:15 am for Coumadin clinic.

## 2011-12-18 NOTE — Progress Notes (Signed)
Will increase Coumadin dose slightly to 5mg  daily except 2.5mg  on Saturday and Monday. Return on 12/25/11 at 11:00 am for lab; 11:15 am for Coumadin clinic.

## 2011-12-18 NOTE — Progress Notes (Signed)
[facility] [phone]) [phone]   [phone]  OFFICE PROGRESS NOTE  [name],[name], [name], [name]  DIAGNOSIS:   #1 Recurrent non-small cell lung cancer consistent with adenocarcinoma. This was initially diagnosed as stage IB (T2a N0 M0) well-differentiated adenocarcinoma of bronchioalveolar type in December 2010.  #2 Acute pulmonary embolism in the right lower lobe lobar, segmental and subsegmental sized pulmonary arteries, diagnosed on 11/04/2011.   PRIOR THERAPY:  1.Status post right upper lobectomy on September 28, 2009 under the care of Dr. Edwyna Shell. The patient refused adjuvant chemotherapy at that time. She had disease recurrence in June of 2012.  2.Status post 4 cycles of systemic chemotherapy with carboplatin for an AUC of 6 and paclitaxel at 200 mg per meter squared and Avastin at 15 mg/kg according to the ECOG protocol #5508.   CURRENT THERAPY: Chemotherapy with Avastin 15 mg/kg according to the ECOG protocol #5508 status post 6 cycles.  INTERVAL HISTORY: [name] 71 y.o. female returns to the clinic today for followup visit accompanied by her daughter. The patient is tolerating her treatment with Avastin fairly well. 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 She is currently on Lyrica 150 mg by mouth twice a day with minimal improvement. She was tried in the past on amitriptyline but she could not tolerate it. She denied having any bleeding issues. She has no significant weight loss or night sweats. She has no chest pain or shortness of breath. The patient has repeat CT scan of the chest performed recently and she is here today for evaluation and discussion of her scan results.  MEDICAL HISTORY: Past Medical History  Diagnosis Date  . Lung cancer 03/08/2011    recurrent  . Hypertension   . Hypothyroidism   . Hypercholesterolemia   . Glaucoma   . Hip pain 09/02/2011  . Foot pain  09/02/2011    ALLERGIES:  is allergic to amitriptyline; codeine; and sulfa antibiotics.  MEDICATIONS:  Current Outpatient Prescriptions  Medication Sig Dispense Refill  . amLODipine (NORVASC) 5 MG tablet Take 5 mg by mouth daily. Per [name], NP       . citalopram (CELEXA) 40 MG tablet Take 40 mg by mouth daily.        . clonazePAM (KLONOPIN) 0.5 MG tablet Take 0.5 mg by mouth 2 (two) times daily as needed. Per [name], NP      . cyanocobalamin (,VITAMIN B-12,) 1000 MCG/ML injection Inject 1,000 mcg into the muscle every 30 (thirty) days.      . dorzolamide-timolol (COSOPT) 22.3-6.8 MG/ML ophthalmic solution 2 drops 2 (two) times daily. Per [name], NP       . levothyroxine (SYNTHROID, LEVOTHROID) 50 MCG tablet Take 50 mcg by mouth 4 (four) times a week. Per [name], NP       . levothyroxine (SYNTHROID, LEVOTHROID) 75 MCG tablet Take 75 mcg by mouth 3 (three) times a week. Per [name], NP       . lidocaine-prilocaine (EMLA) cream Apply 1 application topically as needed.        Marland Kitchen lisinopril (PRINIVIL,ZESTRIL) 40 MG tablet Take 40 mg by mouth daily. Per [name], NP       . pregabalin (LYRICA) 75 MG capsule Take 1 capsule (75 mg total) by mouth 3 (three) times daily.  90 capsule  2  . prochlorperazine (COMPAZINE) 10 MG tablet Take 10 mg by mouth every 6 (  six) hours as needed.        . rosuvastatin (CRESTOR) 10 MG tablet Take 10 mg by mouth daily.        Marland Kitchen warfarin (COUMADIN) 5 MG tablet Take 5 mg by mouth as directed. 5mg  daily except 2.5mg  on Saturdays and Mondays.       No current facility-administered medications for this visit.   Facility-Administered Medications Ordered in Other Visits  Medication Dose Route Frequency Provider Last Rate Last Dose  . 0.9 %  sodium chloride infusion   Intravenous Once Farren Nelles K. Myrtice Lowdermilk, MD      . 0.9 %  sodium chloride infusion   Intravenous Once Autum Benfer K. Amelie Caracci, MD   50 mL at 12/18/11 1445  . bevacizumab (AVASTIN)  900 mg in sodium chloride 0.9 % 100 mL chemo infusion  15 mg/kg (Order-Specific) Intravenous Once Elleah Hemsley K. Denaisha Swango, MD   900 mg at 12/18/11 1414  . heparin lock flush 100 unit/mL  500 Units Intracatheter Once PRN Mina Carlisi K. Arbutus Ped, MD   500 Units at 12/18/11 1508  . DISCONTD: sodium chloride 0.9 % injection 10 mL  10 mL Intracatheter PRN Euel Castile K. Arbutus Ped, MD   10 mL at 12/18/11 1508    SURGICAL HISTORY:  Past Surgical History  Procedure Date  . Video assisted thoracoscopy 09/28/2009  . Thoracotomy 09/28/2009    mini  . Lung lobectomy 09/28/2009    right upper    REVIEW OF SYSTEMS:  A comprehensive review of systems was negative except for: Neurological: positive for paresthesia   PHYSICAL EXAMINATION: General appearance: alert, cooperative and no distress Head: Normocephalic, without obvious abnormality, atraumatic Neck: no adenopathy Resp: clear to auscultation bilaterally Cardio: regular rate and rhythm, S1, S2 normal, no murmur, click, rub or gallop GI: soft, non-tender; bowel sounds normal; no masses,  no organomegaly Extremities: extremities normal, atraumatic, no cyanosis or edema  ECOG PERFORMANCE STATUS: 1 - Symptomatic but completely ambulatory  Blood pressure 147/80, pulse 60, temperature 96.7 F (35.9 C), temperature source Oral, height 5\' 4"  (1.626 m), weight 133 lb (60.328 kg).  LABORATORY DATA: Lab Results  Component Value Date   WBC 6.7 12/18/2011   HGB 13.1 12/18/2011   HCT 39.6 12/18/2011   MCV 88.5 12/18/2011   PLT 132* 12/18/2011      Chemistry      Component Value Date/Time   NA 136 12/18/2011 1127   NA 142 03/08/2011 0754   K 3.9 12/18/2011 1127   K 4.2 03/08/2011 0754   CL 100 12/18/2011 1127   CL 102 03/08/2011 0754   CO2 26 12/18/2011 1127   CO2 27 03/08/2011 0754   BUN 9 12/18/2011 1127   BUN 9 03/08/2011 0754   CREATININE 0.89 12/18/2011 1127   CREATININE 1.0 03/08/2011 0754      Component Value Date/Time   CALCIUM 10.0 12/18/2011 1127   CALCIUM 9.5  03/08/2011 0754   ALKPHOS 79 12/18/2011 1127   ALKPHOS 59 03/08/2011 0754   AST 9 12/18/2011 1127   AST 19 03/08/2011 0754   ALT 5 12/18/2011 1127   BILITOT 0.3 12/18/2011 1127   BILITOT 0.50 03/08/2011 0754       RADIOGRAPHIC STUDIES: Ct Chest W Contrast  12/11/2011  *RADIOLOGY REPORT*  Clinical Data: Lung cancer diagnosed in 2010.  Chemotherapy completed.  History of thyroid nodule.  CT CHEST WITH CONTRAST  Technique:  Multidetector CT imaging of the chest was performed following the standard protocol during bolus administration of intravenous contrast.  Contrast:  80 ml Omnipaque-300 venously.  Comparison: Prior CTs 11/04/2011 and 09/18/2011.  Findings: RECIST protocol 1.1 lesions:  1.  Peripheral right lower lobe ground-glass opacity measures 2.0 cm on image 43, unchanged. 2.  Dominant cystic appearing left lower lobe lesion measures 4.4 cm on image 28, unchanged. The peripheral ground-glass components have not significantly changed.  There is stable volume loss in the right hemithorax status post upper lobe resection.  Numerous other ground-glass opacities are present in both lungs, not significantly changed.  These are seen in the right lower lobe on images 35 and 44.  There are stable left upper lobe lesions on images 14 and 19.  There is an additional left lower lobe ground-glass opacity on image 35.  No new pulmonary nodules are identified.  Right IJ Port-A-Cath appears stable position.  There may be a small fibrin sheath adjacent to the catheter tip.  No residual pulmonary embolus is identified on the current examination.  Diffuse atherosclerosis of the great vessels, aorta and coronary arteries is again noted.  There are no enlarged mediastinal or hilar lymph nodes.  There is no pleural or pericardial effusion.  Images through the upper abdomen again demonstrate multiple calcified granulomas in the liver and spleen.  A small low-density lesion in the dome of the right hepatic lobe appears unchanged.  There is no adrenal mass or suspicious osseous lesion.  IMPRESSION:  1.  Stable postoperative findings status post right upper lobe resection. 2.  Stable multi focal ground-glass opacities in both lungs.  No enlarging nodules or increased solid components identified. 3.  No lymphadenopathy or pleural effusion. 4.  No residual pulmonary embolic disease identified.  Original Report Authenticated By: Gerrianne Scale, M.D.    ASSESSMENT: This is a very pleasant 71 years old [place] American female with recurrent non-small cell lung cancer. She is currently on maintenance Avastin status post 6 cycles. The patient is tolerating her treatment fairly well with no significant adverse effects except for the peripheral neuropathy from the previous induction therapy.  PLAN: I discussed the scan results with the patient and her daughter today. I recommended for her to continue on his maintenance treatment with Avastin according to the ECoG protocol 5508. The patient would come back for followup visit in 3 weeks with the next cycle of her treatment. For the peripheral neuropathy I suggested for the patient to increase her dose of Lyrica 150 mg by mouth 3 times a day but she declined because it makes her more sleepy.  All questions were answered. The patient knows to call the clinic with any problems, questions or concerns. We can certainly see the patient much sooner if necessary.

## 2011-12-25 ENCOUNTER — Other Ambulatory Visit: Payer: Self-pay | Admitting: Pharmacist

## 2011-12-25 ENCOUNTER — Other Ambulatory Visit: Payer: Medicare Other | Admitting: Lab

## 2011-12-25 ENCOUNTER — Ambulatory Visit (HOSPITAL_BASED_OUTPATIENT_CLINIC_OR_DEPARTMENT_OTHER): Payer: Medicare Other | Admitting: Pharmacist

## 2011-12-25 DIAGNOSIS — I2699 Other pulmonary embolism without acute cor pulmonale: Secondary | ICD-10-CM

## 2011-12-25 LAB — POCT INR: INR: 2.3

## 2011-12-25 LAB — PROTIME-INR
INR: 2.3 (ref 2.00–3.50)
Protime: 27.6 Seconds — ABNORMAL HIGH (ref 10.6–13.4)

## 2011-12-25 NOTE — Progress Notes (Signed)
INR therapeutic today on 5 mg/day; 2.5 mg on Mon & Sat. No complaints re: anticoagulation. She gets treatment in 2 weeks so we will check her INR that same day & a pharmacist will see her in the infusion room.  If INR stable then, we can consider checking on monthly basis. Marily Lente, Pharm.D.

## 2012-01-08 ENCOUNTER — Telehealth: Payer: Self-pay | Admitting: Internal Medicine

## 2012-01-08 ENCOUNTER — Encounter: Payer: Self-pay | Admitting: Physician Assistant

## 2012-01-08 ENCOUNTER — Ambulatory Visit (HOSPITAL_BASED_OUTPATIENT_CLINIC_OR_DEPARTMENT_OTHER): Payer: Medicare Other | Admitting: Physician Assistant

## 2012-01-08 ENCOUNTER — Other Ambulatory Visit: Payer: Medicare Other | Admitting: Lab

## 2012-01-08 ENCOUNTER — Ambulatory Visit: Payer: Medicare Other | Admitting: Pharmacist

## 2012-01-08 ENCOUNTER — Ambulatory Visit (HOSPITAL_BASED_OUTPATIENT_CLINIC_OR_DEPARTMENT_OTHER): Payer: Medicare Other

## 2012-01-08 VITALS — BP 144/77 | HR 65 | Temp 96.9°F | Ht 64.0 in | Wt 132.8 lb

## 2012-01-08 VITALS — BP 152/88 | HR 60

## 2012-01-08 DIAGNOSIS — I2699 Other pulmonary embolism without acute cor pulmonale: Secondary | ICD-10-CM

## 2012-01-08 DIAGNOSIS — Z5112 Encounter for antineoplastic immunotherapy: Secondary | ICD-10-CM

## 2012-01-08 DIAGNOSIS — C3490 Malignant neoplasm of unspecified part of unspecified bronchus or lung: Secondary | ICD-10-CM

## 2012-01-08 DIAGNOSIS — D539 Nutritional anemia, unspecified: Secondary | ICD-10-CM

## 2012-01-08 DIAGNOSIS — G609 Hereditary and idiopathic neuropathy, unspecified: Secondary | ICD-10-CM

## 2012-01-08 DIAGNOSIS — R269 Unspecified abnormalities of gait and mobility: Secondary | ICD-10-CM

## 2012-01-08 LAB — CBC WITH DIFFERENTIAL/PLATELET
Eosinophils Absolute: 0.1 10*3/uL (ref 0.0–0.5)
HCT: 40.6 % (ref 34.8–46.6)
LYMPH%: 28 % (ref 14.0–49.7)
MONO#: 0.3 10*3/uL (ref 0.1–0.9)
NEUT#: 4.6 10*3/uL (ref 1.5–6.5)
NEUT%: 65.3 % (ref 38.4–76.8)
Platelets: 127 10*3/uL — ABNORMAL LOW (ref 145–400)
RBC: 4.6 10*6/uL (ref 3.70–5.45)
WBC: 7 10*3/uL (ref 3.9–10.3)

## 2012-01-08 LAB — LACTATE DEHYDROGENASE: LDH: 205 U/L (ref 94–250)

## 2012-01-08 LAB — COMPREHENSIVE METABOLIC PANEL
ALT: 6 U/L (ref 0–35)
Albumin: 4 g/dL (ref 3.5–5.2)
CO2: 29 mEq/L (ref 19–32)
Calcium: 10.4 mg/dL (ref 8.4–10.5)
Chloride: 100 mEq/L (ref 96–112)
Creatinine, Ser: 0.96 mg/dL (ref 0.50–1.10)

## 2012-01-08 LAB — POCT INR: INR: 1.3

## 2012-01-08 LAB — PROTIME-INR: INR: 1.3 — ABNORMAL LOW (ref 2.00–3.50)

## 2012-01-08 LAB — UA PROTEIN, DIPSTICK - CHCC: Protein, ur: NEGATIVE mg/dL

## 2012-01-08 MED ORDER — CYANOCOBALAMIN 1000 MCG/ML IJ SOLN
1000.0000 ug | Freq: Once | INTRAMUSCULAR | Status: AC
  Administered 2012-01-08: 1000 ug via INTRAMUSCULAR

## 2012-01-08 MED ORDER — SODIUM CHLORIDE 0.9 % IV SOLN
15.0000 mg/kg | Freq: Once | INTRAVENOUS | Status: AC
  Administered 2012-01-08: 900 mg via INTRAVENOUS
  Filled 2012-01-08: qty 36

## 2012-01-08 MED ORDER — PREGABALIN 100 MG PO CAPS: 100.0000 mg | ORAL_CAPSULE | Freq: Two times a day (BID) | ORAL | Status: DC

## 2012-01-08 MED ORDER — SODIUM CHLORIDE 0.9 % IV SOLN
Freq: Once | INTRAVENOUS | Status: AC
  Administered 2012-01-08: 14:00:00 via INTRAVENOUS

## 2012-01-08 NOTE — Progress Notes (Signed)
Increase Coumadin slightly to 5mg  daily except 2.5mg  on Mondays. Recheck INR in 1 week.

## 2012-01-08 NOTE — Patient Instructions (Addendum)
Increase Coumadin slightly to 5mg  (1 tablet) daily except 2.5mg  (1/2 tablet) on Mondays. Recheck INR in 1 week.

## 2012-01-08 NOTE — Progress Notes (Signed)
[facility] [phone]   [phone]  OFFICE PROGRESS NOTE  [name],[name], [name], [name]  DIAGNOSIS:   #1 Recurrent non-small cell lung cancer consistent with adenocarcinoma. This was initially diagnosed as stage IB (T2a N0 M0) well-differentiated adenocarcinoma of bronchioalveolar type in December 2010.  #2 Acute pulmonary embolism in the right lower lobe lobar, segmental and subsegmental sized pulmonary arteries, diagnosed on 11/04/2011.   PRIOR THERAPY:  1.Status post right upper lobectomy on September 28, 2009 under the care of Dr. Edwyna Shell. The patient refused adjuvant chemotherapy at that time. She had disease recurrence in June of 2012.  2.Status post 4 cycles of systemic chemotherapy with carboplatin for an AUC of 6 and paclitaxel at 200 mg per meter squared and Avastin at 15 mg/kg according to the ECOG protocol #5508.   CURRENT THERAPY: Chemotherapy with Avastin 15 mg/kg according to the ECOG protocol #5508 status post 7 cycles.  INTERVAL HISTORY: [name] 71 y.o. female returns to the clinic today for followup visit.  The patient is tolerating her treatment with Avastin fairly well. 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 She is currently on Lyrica 150 mg by mouth twice a day with minimal improvement. She was tried in the past on amitriptyline but she could not tolerate it. She denied having any bleeding or bruising issues. She complains of her feet still hurting. She did try trimming her calluses and states that this usually relieves some of the pain but was not helpful this time. She continues to have right arm and shoulder pain radiating from the collarbone through to the back. She also reports that her head feels "heavy" particularly when she is bending forward almost as if she will tip over. She continues to have some unsteadiness of gait. She has no significant weight loss or  night sweats. She has no chest pain or shortness of breath.   MEDICAL HISTORY: Past Medical History  Diagnosis Date  . Lung cancer 03/08/2011    recurrent  . Hypertension   . Hypothyroidism   . Hypercholesterolemia   . Glaucoma   . Hip pain 09/02/2011  . Foot pain 09/02/2011    ALLERGIES:  is allergic to amitriptyline; codeine; and sulfa antibiotics.  MEDICATIONS:  Current Outpatient Prescriptions  Medication Sig Dispense Refill  . amLODipine (NORVASC) 5 MG tablet Take 5 mg by mouth daily. Per [name], NP       . citalopram (CELEXA) 40 MG tablet Take 40 mg by mouth daily.        . clonazePAM (KLONOPIN) 0.5 MG tablet Take 0.5 mg by mouth 2 (two) times daily as needed. Per [name], NP      . cyanocobalamin (,VITAMIN B-12,) 1000 MCG/ML injection Inject 1,000 mcg into the muscle every 30 (thirty) days.      . dorzolamide-timolol (COSOPT) 22.3-6.8 MG/ML ophthalmic solution 2 drops 2 (two) times daily. Per [name], NP       . levothyroxine (SYNTHROID, LEVOTHROID) 50 MCG tablet Take 50 mcg by mouth 4 (four) times a week. Per [name], NP       . levothyroxine (SYNTHROID, LEVOTHROID) 75 MCG tablet Take 75 mcg by mouth 3 (three) times a week. Per [name], NP       . lidocaine-prilocaine (EMLA) cream Apply 1 application topically as needed.        Marland Kitchen lisinopril (PRINIVIL,ZESTRIL) 40 MG tablet Take 40  mg by mouth daily. Per [name], NP       . pregabalin (LYRICA) 100 MG capsule Take 1 capsule (100 mg total) by mouth 2 (two) times daily.  90 capsule  2  . prochlorperazine (COMPAZINE) 10 MG tablet Take 10 mg by mouth every 6 (six) hours as needed.        . rosuvastatin (CRESTOR) 10 MG tablet Take 10 mg by mouth daily.        Marland Kitchen warfarin (COUMADIN) 5 MG tablet Take 5 mg by mouth as directed. 5mg  daily except 2.5mg  on Saturdays and Mondays.       No current facility-administered medications for this visit.   Facility-Administered Medications Ordered in Other  Visits  Medication Dose Route Frequency Provider Last Rate Last Dose  . 0.9 %  sodium chloride infusion   Intravenous Once Si Gaul, MD      . 0.9 %  sodium chloride infusion   Intravenous Once Si Gaul, MD      . bevacizumab (AVASTIN) 900 mg in sodium chloride 0.9 % 100 mL chemo infusion  15 mg/kg (Order-Specific) Intravenous Once Si Gaul, MD   900 mg at 01/08/12 1350  . cyanocobalamin ((VITAMIN B-12)) injection 1,000 mcg  1,000 mcg Intramuscular Once Si Gaul, MD   1,000 mcg at 01/08/12 1335    SURGICAL HISTORY:  Past Surgical History  Procedure Date  . Video assisted thoracoscopy 09/28/2009  . Thoracotomy 09/28/2009    mini  . Lung lobectomy 09/28/2009    right upper    REVIEW OF SYSTEMS:  A comprehensive review of systems was negative except for: Musculoskeletal: positive for arthralgias, back pain and Right shoulder and arm pain radiating from the collar bone through to the back Neurological: positive for gait problems and paresthesia   PHYSICAL EXAMINATION: General appearance: alert, cooperative and no distress Head: Normocephalic, without obvious abnormality, atraumatic Neck: no adenopathy Resp: clear to auscultation bilaterally Cardio: regular rate and rhythm, S1, S2 normal, no murmur, click, rub or gallop GI: soft, non-tender; bowel sounds normal; no masses,  no organomegaly Extremities: extremities normal, atraumatic, no cyanosis or edema  ECOG PERFORMANCE STATUS: 1 - Symptomatic but completely ambulatory  Blood pressure 144/77, pulse 65, temperature 96.9 F (36.1 C), temperature source Oral, height 5\' 4"  (1.626 m), weight 132 lb 12.8 oz (60.238 kg).  LABORATORY DATA: Lab Results  Component Value Date   WBC 7.0 01/08/2012   HGB 13.4 01/08/2012   HCT 40.6 01/08/2012   MCV 88.3 01/08/2012   PLT 127* 01/08/2012      Chemistry      Component Value Date/Time   NA 137 01/08/2012 1159   NA 142 03/08/2011 0754   K 3.8 01/08/2012 1159   K 4.2  03/08/2011 0754   CL 100 01/08/2012 1159   CL 102 03/08/2011 0754   CO2 29 01/08/2012 1159   CO2 27 03/08/2011 0754   BUN 9 01/08/2012 1159   BUN 9 03/08/2011 0754   CREATININE 0.96 01/08/2012 1159   CREATININE 1.0 03/08/2011 0754      Component Value Date/Time   CALCIUM 10.4 01/08/2012 1159   CALCIUM 9.5 03/08/2011 0754   ALKPHOS 82 01/08/2012 1159   ALKPHOS 59 03/08/2011 0754   AST 11 01/08/2012 1159   AST 19 03/08/2011 0754   ALT 6 01/08/2012 1159   BILITOT 0.5 01/08/2012 1159   BILITOT 0.50 03/08/2011 0754       RADIOGRAPHIC STUDIES: Ct Chest W Contrast  12/11/2011  *RADIOLOGY REPORT*  Clinical Data: Lung cancer diagnosed in 2010.  Chemotherapy completed.  History of thyroid nodule.  CT CHEST WITH CONTRAST  Technique:  Multidetector CT imaging of the chest was performed following the standard protocol during bolus administration of intravenous contrast.  Contrast:  80 ml Omnipaque-300 venously.  Comparison: Prior CTs 11/04/2011 and 09/18/2011.  Findings: RECIST protocol 1.1 lesions:  1.  Peripheral right lower lobe ground-glass opacity measures 2.0 cm on image 43, unchanged. 2.  Dominant cystic appearing left lower lobe lesion measures 4.4 cm on image 28, unchanged. The peripheral ground-glass components have not significantly changed.  There is stable volume loss in the right hemithorax status post upper lobe resection.  Numerous other ground-glass opacities are present in both lungs, not significantly changed.  These are seen in the right lower lobe on images 35 and 44.  There are stable left upper lobe lesions on images 14 and 19.  There is an additional left lower lobe ground-glass opacity on image 35.  No new pulmonary nodules are identified.  Right IJ Port-A-Cath appears stable position.  There may be a small fibrin sheath adjacent to the catheter tip.  No residual pulmonary embolus is identified on the current examination.  Diffuse atherosclerosis of the great vessels, aorta and coronary arteries  is again noted.  There are no enlarged mediastinal or hilar lymph nodes.  There is no pleural or pericardial effusion.  Images through the upper abdomen again demonstrate multiple calcified granulomas in the liver and spleen.  A small low-density lesion in the dome of the right hepatic lobe appears unchanged. There is no adrenal mass or suspicious osseous lesion.  IMPRESSION:  1.  Stable postoperative findings status post right upper lobe resection. 2.  Stable multi focal ground-glass opacities in both lungs.  No enlarging nodules or increased solid components identified. 3.  No lymphadenopathy or pleural effusion. 4.  No residual pulmonary embolic disease identified.  Original Report Authenticated By: Gerrianne Scale, M.D.    ASSESSMENT/PLAN: This is a very pleasant 71 years old [place] American female with recurrent non-small cell lung cancer. She is currently on maintenance Avastin status post 7 cycles. The patient is tolerating her treatment fairly well with no significant adverse effects except for the peripheral neuropathy from the previous induction therapy. The patient was discussed with Dr. Arbutus Ped. We will keep her on Lyrica a total of 300 mg daily. Once she has exhausted her supply of Lyrica 75 mg capsules we will have her switch to a Lyrica 100 mg capsule by mouth 3 times daily. She is given a prescription for a total of 90 with 2 refills. She'll followup as per protocol with a CBC differential, stat C. met and urine protein and CT of her chest with contrast. To further evaluate her gait unsteadiness as well as the sensation of her head feeling heavy, we will also obtain a CT of her head with and without contrast revealed evaluate for possible brain metastasis. The unsteadiness is likely secondary to the peripheral neuropathy however we do want to rule out the possibility of any brain metastasis. Her INR is subtherapeutic this is adjusted by [facility] and she is  to followup with them as they directed.  Laural Benes, Jakai Risse E, PA-C    All questions were answered. The patient knows to call the clinic with any problems, questions or concerns. We can certainly see the patient much sooner if necessary.

## 2012-01-08 NOTE — Telephone Encounter (Signed)
gv pt appt schedule for April including ct 4/11.

## 2012-01-08 NOTE — Progress Notes (Signed)
Patient in to clinic today for evaluation prior to maintenance cycle 8. Based on lab results review and physical exam by Tiana Loft PA, patient condition is acceptable for treatment. Sign for infusion given to Horton Marshall RN.

## 2012-01-15 ENCOUNTER — Ambulatory Visit (HOSPITAL_BASED_OUTPATIENT_CLINIC_OR_DEPARTMENT_OTHER): Payer: Medicare Other | Admitting: Pharmacist

## 2012-01-15 ENCOUNTER — Other Ambulatory Visit: Payer: Medicare Other | Admitting: Lab

## 2012-01-15 DIAGNOSIS — I2699 Other pulmonary embolism without acute cor pulmonale: Secondary | ICD-10-CM

## 2012-01-15 LAB — PROTIME-INR
INR: 2 (ref 2.00–3.50)
Protime: 24 Seconds — ABNORMAL HIGH (ref 10.6–13.4)

## 2012-01-15 LAB — POCT INR: INR: 2

## 2012-01-15 NOTE — Progress Notes (Signed)
INR = 2 today.  Pt deviated from our plan at last visit a little.  She was confused about her Saturday dose so she held it.  Essentially she took 5 mg/day except 2.5 mg on Monday & skipped Saturday's dose altogether. I will resume her previous dose on 5 mg/day; 2.5 mg on Mon & Sat. Return 01/23/12 before CT scan appt for lab/Coumadin clinic. Marily Lente, Pharm.D.

## 2012-01-23 ENCOUNTER — Ambulatory Visit: Payer: Medicare Other | Admitting: Pharmacist

## 2012-01-23 ENCOUNTER — Other Ambulatory Visit (HOSPITAL_BASED_OUTPATIENT_CLINIC_OR_DEPARTMENT_OTHER): Payer: Medicare Other | Admitting: Lab

## 2012-01-23 ENCOUNTER — Ambulatory Visit (HOSPITAL_COMMUNITY)
Admission: RE | Admit: 2012-01-23 | Discharge: 2012-01-23 | Disposition: A | Discharge status: Home / Self Care | Payer: Medicare Other | Source: Ambulatory Visit | Attending: Physician Assistant | Admitting: Physician Assistant

## 2012-01-23 DIAGNOSIS — I2699 Other pulmonary embolism without acute cor pulmonale: Secondary | ICD-10-CM

## 2012-01-23 DIAGNOSIS — C3490 Malignant neoplasm of unspecified part of unspecified bronchus or lung: Secondary | ICD-10-CM | POA: Insufficient documentation

## 2012-01-23 DIAGNOSIS — K7689 Other specified diseases of liver: Secondary | ICD-10-CM | POA: Insufficient documentation

## 2012-01-23 LAB — PROTIME-INR: Protime: 26.4 Seconds — ABNORMAL HIGH (ref 10.6–13.4)

## 2012-01-23 MED ORDER — IOHEXOL 300 MG/ML  SOLN
100.0000 mL | Freq: Once | INTRAMUSCULAR | Status: AC | PRN
  Administered 2012-01-23: 100 mL via INTRAVENOUS

## 2012-01-23 NOTE — Progress Notes (Signed)
INR therapeutic (2.2).  No complaints.  Took herself off Crestor when she realized this may be the cause of her myalgia (she couldn't lift her arms over her head or put on a coat by herself).  Myalgia symptoms have improved.   Will continue current dose of 5mg  daily except 2.5mg  on Mon and Saturday.  Recheck INR in 2 weeks.

## 2012-01-24 ENCOUNTER — Other Ambulatory Visit: Payer: Self-pay | Admitting: *Deleted

## 2012-01-24 DIAGNOSIS — C3490 Malignant neoplasm of unspecified part of unspecified bronchus or lung: Secondary | ICD-10-CM

## 2012-01-29 ENCOUNTER — Other Ambulatory Visit (HOSPITAL_BASED_OUTPATIENT_CLINIC_OR_DEPARTMENT_OTHER): Payer: Medicare Other | Admitting: Lab

## 2012-01-29 ENCOUNTER — Encounter: Payer: Medicare Other | Admitting: *Deleted

## 2012-01-29 ENCOUNTER — Telehealth: Payer: Self-pay | Admitting: Internal Medicine

## 2012-01-29 ENCOUNTER — Ambulatory Visit (HOSPITAL_BASED_OUTPATIENT_CLINIC_OR_DEPARTMENT_OTHER): Payer: Medicare Other

## 2012-01-29 ENCOUNTER — Ambulatory Visit (HOSPITAL_BASED_OUTPATIENT_CLINIC_OR_DEPARTMENT_OTHER): Payer: Medicare Other | Admitting: Internal Medicine

## 2012-01-29 VITALS — BP 152/81 | HR 57 | Temp 97.4°F

## 2012-01-29 VITALS — BP 145/77 | HR 61 | Temp 96.6°F | Ht 64.0 in | Wt 131.3 lb

## 2012-01-29 DIAGNOSIS — C3490 Malignant neoplasm of unspecified part of unspecified bronchus or lung: Secondary | ICD-10-CM

## 2012-01-29 DIAGNOSIS — I2699 Other pulmonary embolism without acute cor pulmonale: Secondary | ICD-10-CM

## 2012-01-29 DIAGNOSIS — G609 Hereditary and idiopathic neuropathy, unspecified: Secondary | ICD-10-CM

## 2012-01-29 DIAGNOSIS — Z5112 Encounter for antineoplastic immunotherapy: Secondary | ICD-10-CM

## 2012-01-29 DIAGNOSIS — C3410 Malignant neoplasm of upper lobe, unspecified bronchus or lung: Secondary | ICD-10-CM

## 2012-01-29 LAB — COMPREHENSIVE METABOLIC PANEL
ALT: 5 U/L (ref 0–35)
AST: 12 U/L (ref 0–37)
Albumin: 3.7 g/dL (ref 3.5–5.2)
Alkaline Phosphatase: 73 U/L (ref 39–117)
BUN: 9 mg/dL (ref 6–23)
Potassium: 3.7 mEq/L (ref 3.5–5.3)
Sodium: 138 mEq/L (ref 135–145)
Total Protein: 7.2 g/dL (ref 6.0–8.3)

## 2012-01-29 LAB — CBC WITH DIFFERENTIAL/PLATELET
BASO%: 0.8 % (ref 0.0–2.0)
EOS%: 1.7 % (ref 0.0–7.0)
MCH: 29 pg (ref 25.1–34.0)
MCHC: 32.7 g/dL (ref 31.5–36.0)
RBC: 4.49 10*6/uL (ref 3.70–5.45)
RDW: 15.9 % — ABNORMAL HIGH (ref 11.2–14.5)
WBC: 5.4 10*3/uL (ref 3.9–10.3)
lymph#: 1.9 10*3/uL (ref 0.9–3.3)
nRBC: 0 % (ref 0–0)

## 2012-01-29 LAB — LACTATE DEHYDROGENASE: LDH: 193 U/L (ref 94–250)

## 2012-01-29 LAB — PROTIME-INR

## 2012-01-29 MED ORDER — SODIUM CHLORIDE 0.9 % IV SOLN
15.0000 mg/kg | Freq: Once | INTRAVENOUS | Status: AC
  Administered 2012-01-29: 900 mg via INTRAVENOUS
  Filled 2012-01-29: qty 36

## 2012-01-29 MED ORDER — SODIUM CHLORIDE 0.9 % IJ SOLN
10.0000 mL | INTRAMUSCULAR | Status: DC | PRN
Start: 2012-01-29 — End: 2012-01-29
  Administered 2012-01-29: 10 mL
  Filled 2012-01-29: qty 10

## 2012-01-29 MED ORDER — HEPARIN SOD (PORK) LOCK FLUSH 100 UNIT/ML IV SOLN
500.0000 [IU] | Freq: Once | INTRAVENOUS | Status: AC | PRN
  Administered 2012-01-29: 500 [IU]
  Filled 2012-01-29: qty 5

## 2012-01-29 MED ORDER — SODIUM CHLORIDE 0.9 % IV SOLN
Freq: Once | INTRAVENOUS | Status: AC
  Administered 2012-01-29: 11:00:00 via INTRAVENOUS

## 2012-01-29 MED ORDER — SODIUM CHLORIDE 0.9 % IV SOLN
Freq: Once | INTRAVENOUS | Status: AC
  Administered 2012-01-29: 12:00:00 via INTRAVENOUS

## 2012-01-29 NOTE — Progress Notes (Signed)
[facility] [phone]   Fax:([phone]  OFFICE PROGRESS NOTE  [name],[name], [name], [name]  DIAGNOSIS:  #1 Recurrent non-small cell lung cancer consistent with adenocarcinoma. This was initially diagnosed as stage IB (T2a N0 M0) well-differentiated adenocarcinoma of bronchioalveolar type in December 2010.  #2 Acute pulmonary embolism in the right lower lobe lobar, segmental and subsegmental sized pulmonary arteries, diagnosed on 11/04/2011.   PRIOR THERAPY:  1.Status post right upper lobectomy on September 28, 2009 under the care of Dr. Edwyna Shell. The patient refused adjuvant chemotherapy at that time. She had disease recurrence in June of 2012.  2.Status post 4 cycles of systemic chemotherapy with carboplatin for an AUC of 6 and paclitaxel at 200 mg per meter squared and Avastin at 15 mg/kg according to the ECOG protocol #5508.   CURRENT THERAPY: Chemotherapy with Avastin 15 mg/kg according to the ECOG protocol #5508 status post 8 cycles.   INTERVAL HISTORY: [name] 71 y.o. female returns to the clinic today for followup visit accompanied by her daughter. The patient tolerated the last cycle of her chemotherapy fairly well with no significant complaints except for the persistent peripheral neuropathy. She is currently on lyrica 100 mg by mouth 3 times a day. The patient denied having any significant chest pain or shortness of breath. She has no cough or hemoptysis. No significant weight loss. She has repeat CT scan of the head and chest performed recently and she is here today for evaluation and discussion of her scan results.  MEDICAL HISTORY: Past Medical History  Diagnosis Date  . Lung cancer 03/08/2011    recurrent  . Hypertension   . Hypothyroidism   . Hypercholesterolemia   . Glaucoma   . Hip pain 09/02/2011  . Foot pain 09/02/2011    ALLERGIES:  is allergic to amitriptyline; codeine; and sulfa  antibiotics.  MEDICATIONS:  Current Outpatient Prescriptions  Medication Sig Dispense Refill  . amLODipine (NORVASC) 5 MG tablet Take 5 mg by mouth daily. Per [name], NP       . citalopram (CELEXA) 40 MG tablet Take 40 mg by mouth daily.        . clonazePAM (KLONOPIN) 0.5 MG tablet Take 0.5 mg by mouth 2 (two) times daily as needed. Per [name], NP      . cyanocobalamin (,VITAMIN B-12,) 1000 MCG/ML injection Inject 1,000 mcg into the muscle every 30 (thirty) days.      . dorzolamide-timolol (COSOPT) 22.3-6.8 MG/ML ophthalmic solution 2 drops 2 (two) times daily. Per [name], NP       . levothyroxine (SYNTHROID, LEVOTHROID) 50 MCG tablet Take 50 mcg by mouth 4 (four) times a week. Per [name], NP       . levothyroxine (SYNTHROID, LEVOTHROID) 75 MCG tablet Take 75 mcg by mouth 3 (three) times a week. Per [name], NP       . lidocaine-prilocaine (EMLA) cream Apply 1 application topically as needed.        Marland Kitchen lisinopril (PRINIVIL,ZESTRIL) 40 MG tablet Take 40 mg by mouth daily. Per [name], NP       . pregabalin (LYRICA) 100 MG capsule Take 1 capsule (100 mg total) by mouth 2 (two) times daily.  90 capsule  2  . prochlorperazine (COMPAZINE) 10 MG tablet Take 10 mg by mouth every 6 (six) hours as needed.        . rosuvastatin (CRESTOR) 10 MG tablet Take 10 mg  by mouth daily.        Marland Kitchen warfarin (COUMADIN) 5 MG tablet Take 5 mg by mouth as directed. 5mg  daily except 2.5mg  on Saturdays and Mondays.        SURGICAL HISTORY:  Past Surgical History  Procedure Date  . Video assisted thoracoscopy 09/28/2009  . Thoracotomy 09/28/2009    mini  . Lung lobectomy 09/28/2009    right upper    REVIEW OF SYSTEMS:  A comprehensive review of systems was negative except for: Constitutional: positive for fatigue Neurological: positive for paresthesia   PHYSICAL EXAMINATION: General appearance: alert, cooperative and no distress Head: Normocephalic, without obvious  abnormality, atraumatic Neck: no adenopathy Lymph nodes: Cervical, supraclavicular, and axillary nodes normal. Resp: clear to auscultation bilaterally Cardio: regular rate and rhythm, S1, S2 normal, no murmur, click, rub or gallop GI: soft, non-tender; bowel sounds normal; no masses,  no organomegaly Extremities: extremities normal, atraumatic, no cyanosis or edema Neurologic: Alert and oriented X 3, normal strength and tone. Normal symmetric reflexes. Normal coordination and gait  ECOG PERFORMANCE STATUS: 1 - Symptomatic but completely ambulatory  Blood pressure 145/77, pulse 61, temperature 96.6 F (35.9 C), temperature source Oral, height 5\' 4"  (1.626 m), weight 131 lb 4.8 oz (59.557 kg).  LABORATORY DATA: Lab Results  Component Value Date   WBC 5.4 01/29/2012   HGB 13.0 01/29/2012   HCT 39.8 01/29/2012   MCV 88.7 01/29/2012   PLT 120* 01/29/2012      Chemistry      Component Value Date/Time   NA 137 01/08/2012 1159   NA 142 03/08/2011 0754   K 3.8 01/08/2012 1159   K 4.2 03/08/2011 0754   CL 100 01/08/2012 1159   CL 102 03/08/2011 0754   CO2 29 01/08/2012 1159   CO2 27 03/08/2011 0754   BUN 9 01/08/2012 1159   BUN 9 03/08/2011 0754   CREATININE 0.96 01/08/2012 1159   CREATININE 1.0 03/08/2011 0754      Component Value Date/Time   CALCIUM 10.4 01/08/2012 1159   CALCIUM 9.5 03/08/2011 0754   ALKPHOS 82 01/08/2012 1159   ALKPHOS 59 03/08/2011 0754   AST 11 01/08/2012 1159   AST 19 03/08/2011 0754   ALT 6 01/08/2012 1159   BILITOT 0.5 01/08/2012 1159   BILITOT 0.50 03/08/2011 0754       RADIOGRAPHIC STUDIES: Ct Head W Wo Contrast  01/23/2012  *RADIOLOGY REPORT*  Clinical Data: Lung cancer.  CT HEAD WITHOUT AND WITH CONTRAST  Technique:  Contiguous axial images were obtained from the base of the skull through the vertex without and with intravenous contrast.  Contrast: OMNIPAQUE IOHEXOL 300 MG/ML  SOLN  Comparison: 04/24/2011  Findings: There is no evidence for acute infarction,  intracranial hemorrhage, mass lesion, hydrocephalus, or extra-axial fluid.  Mild atrophy.  Suspected mild to moderate chronic microvascular ischemic change.  Calvarium intact.  Vascular calcification.  Clear sinuses and mastoids.  No abnormal intracranial enhancement. Compared to priors, there is no change.  IMPRESSION: Stable exam.  Age related atrophy and small vessel disease.  No visible intracranial metastatic disease.  Original Report Authenticated By: Elsie Stain, M.D.   Ct Chest W Contrast  01/23/2012  *RADIOLOGY REPORT*  Clinical Data: Metastatic lung cancer.  Restaging post completion of chemotherapy. RECIST protocol patient.  CT CHEST WITH CONTRAST  Technique:  Multidetector CT imaging of the chest was performed following the standard protocol during bolus administration of intravenous contrast.  Contrast: OMNIPAQUE IOHEXOL 300 MG/ML  SOLN  Comparison: Prior examinations 03/08/2011, 11/04/2011 and 12/11/2011.  Findings: RECIST protocol 1.1 lesions:  1.  Peripheral right lower lobe ground-glass opacity measures 1.7 cm on image 41, previously 2.0 cm. 2.  The dominant cystic appearing left lower lobe lesion with surrounding soft tissue components measures 4.3 cm on image 23, previously 4.4 cm.  There are stable postsurgical changes status post right upper lobe resection.  As demonstrated on prior studies, there are numerous other ground-glass opacities in both lungs.  These have not substantially changed.  No new or enlarging pulmonary nodules are identified.  There are no enlarged mediastinal or hilar lymph nodes. There are calcified left hilar lymph nodes and a calcified lingular granuloma.  There is no pleural or pericardial effusion.  Right IJ Port-A-Cath is unchanged.  Visualized upper abdomen has a stable appearance with multiple calcified granulomas in the spleen and liver.  A small hepatic cyst is stable.  There is no adrenal mass.  IMPRESSION:  1.  Stable postoperative findings status  post right upper lobe resection. 2.  Stable multifocal ground-glass opacities in both lungs.  No enlarging nodules or increased solid components identified. Adenocarcinoma cannot be excluded.  Followup by CT is recommended in 12 months, with continued annual surveillance for a minimum of 3 years.  These recommendations are taken from "Recommendations for the Management of Subsolid Pulmonary Nodules Detected at CT:  A Statement from the Fleischner Society" Radiology 2013; 266:1, 304- 317. 3.  No lymphadenopathy or pleural effusion. No evidence of metastatic disease.  Original Report Authenticated By: Gerrianne Scale, M.D.    ASSESSMENT: This is a very pleasant 71 years old [place] American female with recurrent non-small cell lung cancer. She is currently on maintenance Avastin status post 8 cycles and tolerating it fairly well.  PLAN: I discussed the scan results and showed the images to the patient. I recommended for her to continue on the same treatment for now. She would come back for followup visit in 3 weeks with the start of cycle #10. For the peripheral neuropathy, she would continue on the 100 mg by mouth 3 times a day.  She was advised to call me immediately if she has any concerning symptoms in the interval.  All questions were answered. The patient knows to call the clinic with any problems, questions or concerns. We can certainly see the patient much sooner if necessary.  I spent 20 minutes counseling the patient face to face. The total time spent in the appointment was 30 minutes.

## 2012-01-29 NOTE — Progress Notes (Signed)
Patient in to clinic this morning with daughter, for assessment and Cycle 9 maintenance therapy. CT scans reviewed by Dr. Arbutus Ped; with evidence of continued stable disease, patient will continue maintenance therapy with bevacizumab. Based on lab results review and physical exam by Dr. Arbutus Ped, patient condition is acceptable for retreatment today. Grade 1 elevated creatinine reviewed by Dr. Arbutus Ped and does not require dose modification per protocol. Sign for infusion given to Lorenza Evangelist RN.

## 2012-01-29 NOTE — Telephone Encounter (Signed)
appt made and printed for pt aom °

## 2012-02-06 ENCOUNTER — Other Ambulatory Visit (HOSPITAL_BASED_OUTPATIENT_CLINIC_OR_DEPARTMENT_OTHER): Payer: Medicare Other | Admitting: Lab

## 2012-02-06 ENCOUNTER — Ambulatory Visit (HOSPITAL_BASED_OUTPATIENT_CLINIC_OR_DEPARTMENT_OTHER): Payer: Medicare Other | Admitting: Pharmacist

## 2012-02-06 VITALS — BP 134/75 | HR 62 | Temp 97.0°F

## 2012-02-06 DIAGNOSIS — I2699 Other pulmonary embolism without acute cor pulmonale: Secondary | ICD-10-CM

## 2012-02-06 DIAGNOSIS — D539 Nutritional anemia, unspecified: Secondary | ICD-10-CM

## 2012-02-06 DIAGNOSIS — C3410 Malignant neoplasm of upper lobe, unspecified bronchus or lung: Secondary | ICD-10-CM

## 2012-02-06 LAB — PROTIME-INR: Protime: 34.8 Seconds — ABNORMAL HIGH (ref 10.6–13.4)

## 2012-02-06 MED ORDER — CYANOCOBALAMIN 1000 MCG/ML IJ SOLN
1000.0000 ug | Freq: Once | INTRAMUSCULAR | Status: AC
  Administered 2012-02-06: 1000 ug via INTRAMUSCULAR

## 2012-02-06 NOTE — Progress Notes (Signed)
INR therapeutic (2.9).  No complaints.  No changes in medications - although she is still refraining from taking rosuvastatin on account of the myalgias it caused in her upper body.  She has yet to see her PCP to address replacement lipid management.  Will continue current dose of 5mg  daily except 2.5mg  on Mon and Sat.  Recheck INR in 2 weeks to ensure that her INR stabilizes - it has been steadily increasing over the last month.  Pt communicated understanding.  She also requested that we administer her monthly B12 shot today, as she is due.  Arranged for Liborio Nixon, RN to work her in as a walk-in injection appt for her B12 shot.

## 2012-02-19 ENCOUNTER — Encounter: Payer: Self-pay | Admitting: Physician Assistant

## 2012-02-19 ENCOUNTER — Ambulatory Visit: Payer: Medicare Other | Admitting: Pharmacist

## 2012-02-19 ENCOUNTER — Ambulatory Visit (HOSPITAL_BASED_OUTPATIENT_CLINIC_OR_DEPARTMENT_OTHER): Payer: Medicare Other

## 2012-02-19 ENCOUNTER — Telehealth: Payer: Self-pay | Admitting: Internal Medicine

## 2012-02-19 ENCOUNTER — Ambulatory Visit (HOSPITAL_BASED_OUTPATIENT_CLINIC_OR_DEPARTMENT_OTHER): Payer: Medicare Other | Admitting: Physician Assistant

## 2012-02-19 ENCOUNTER — Encounter: Payer: Medicare Other | Admitting: *Deleted

## 2012-02-19 ENCOUNTER — Other Ambulatory Visit: Payer: Self-pay | Admitting: Internal Medicine

## 2012-02-19 ENCOUNTER — Other Ambulatory Visit (HOSPITAL_BASED_OUTPATIENT_CLINIC_OR_DEPARTMENT_OTHER): Payer: Medicare Other | Admitting: Lab

## 2012-02-19 VITALS — BP 158/81 | HR 66 | Temp 96.8°F | Ht 64.0 in | Wt 131.3 lb

## 2012-02-19 DIAGNOSIS — C3410 Malignant neoplasm of upper lobe, unspecified bronchus or lung: Secondary | ICD-10-CM

## 2012-02-19 DIAGNOSIS — R509 Fever, unspecified: Secondary | ICD-10-CM

## 2012-02-19 DIAGNOSIS — C3490 Malignant neoplasm of unspecified part of unspecified bronchus or lung: Secondary | ICD-10-CM

## 2012-02-19 DIAGNOSIS — M25519 Pain in unspecified shoulder: Secondary | ICD-10-CM

## 2012-02-19 DIAGNOSIS — I2699 Other pulmonary embolism without acute cor pulmonale: Secondary | ICD-10-CM

## 2012-02-19 DIAGNOSIS — Z86711 Personal history of pulmonary embolism: Secondary | ICD-10-CM

## 2012-02-19 DIAGNOSIS — Z5112 Encounter for antineoplastic immunotherapy: Secondary | ICD-10-CM

## 2012-02-19 DIAGNOSIS — G609 Hereditary and idiopathic neuropathy, unspecified: Secondary | ICD-10-CM

## 2012-02-19 LAB — CBC WITH DIFFERENTIAL/PLATELET
BASO%: 0.7 % (ref 0.0–2.0)
LYMPH%: 22.3 % (ref 14.0–49.7)
MCHC: 33.3 g/dL (ref 31.5–36.0)
MONO#: 0.2 10*3/uL (ref 0.1–0.9)
MONO%: 4.3 % (ref 0.0–14.0)
Platelets: 109 10*3/uL — ABNORMAL LOW (ref 145–400)
RBC: 4.72 10*6/uL (ref 3.70–5.45)
WBC: 5.6 10*3/uL (ref 3.9–10.3)

## 2012-02-19 LAB — COMPREHENSIVE METABOLIC PANEL
ALT: 5 U/L (ref 0–35)
AST: 11 U/L (ref 0–37)
Alkaline Phosphatase: 71 U/L (ref 39–117)
Chloride: 99 mEq/L (ref 96–112)
Creatinine, Ser: 1.06 mg/dL (ref 0.50–1.10)
Total Bilirubin: 0.3 mg/dL (ref 0.3–1.2)

## 2012-02-19 LAB — UA PROTEIN, DIPSTICK - CHCC: Protein, ur: NEGATIVE mg/dL

## 2012-02-19 LAB — PROTIME-INR: INR: 3.3 (ref 2.00–3.50)

## 2012-02-19 MED ORDER — SODIUM CHLORIDE 0.9 % IJ SOLN
10.0000 mL | INTRAMUSCULAR | Status: DC | PRN
  Administered 2012-02-19: 10 mL
  Filled 2012-02-19: qty 10

## 2012-02-19 MED ORDER — SODIUM CHLORIDE 0.9 % IV SOLN
Freq: Once | INTRAVENOUS | Status: AC
  Administered 2012-02-19: 12:00:00 via INTRAVENOUS

## 2012-02-19 MED ORDER — SODIUM CHLORIDE 0.9 % IV SOLN
15.0000 mg/kg | Freq: Once | INTRAVENOUS | Status: AC
  Administered 2012-02-19: 900 mg via INTRAVENOUS
  Filled 2012-02-19: qty 36

## 2012-02-19 MED ORDER — FONDAPARINUX SODIUM 5 MG/0.4ML ~~LOC~~ SOLN: 7.5000 mg | SUBCUTANEOUS | Status: DC

## 2012-02-19 MED ORDER — HEPARIN SOD (PORK) LOCK FLUSH 100 UNIT/ML IV SOLN
500.0000 [IU] | Freq: Once | INTRAVENOUS | Status: AC | PRN
  Administered 2012-02-19: 500 [IU]
  Filled 2012-02-19: qty 5

## 2012-02-19 NOTE — Progress Notes (Signed)
Patient in to clinic today for evaluation and maintenance Cycle 10 treatment. Patient has continued complaints related to peripheral neuropathy affecting ability to perform some ADLs (grade 2), and reports upper extremity weakness that was worse last week, but improved some this week. Discussed potential for referral to PT/OT with Tiana Loft PA. Based on lab results review and physical exam by Tiana Loft PA, patient condition is acceptable for treatment today. Note that while full CMET and LDH results were available in the EMR as of 11:15am, magnesium result was reported verbally by the lab as 2.0, also at 11:15am. Due to delay in printing reports caused by computer downtime, final report was not released until 12:38pm. Sign for infusion given to [facility] RN.

## 2012-02-19 NOTE — Telephone Encounter (Signed)
Gv pt appt for may2013.  scheduled pt for ct scan for 05/23 @ [facility]. CALLED [facility] and informed him of referral for phy thep 

## 2012-02-19 NOTE — Progress Notes (Signed)
INR up to 3.3 today on 5 mg/day; 2.5 mg Mon/Sat. Her INR has been increasing for the past 3 visits. No med changes, no bleeding, no missed/extra doses of coumadin.  Appetite is good. Adjust Coumadin dose down to 5 mg/day; 2.5 mg Mon/Wed/Sat. Return in ~10 days. Marily Lente, Pharm.D.

## 2012-02-21 NOTE — Progress Notes (Signed)
[facility] [phone]   Fax:([phone]) [phone]  OFFICE PROGRESS NOTE  [name],[name], [name], [name]  DIAGNOSIS:  #1 Recurrent non-small cell lung cancer consistent with adenocarcinoma. This was initially diagnosed as stage IB (T2a N0 M0) well-differentiated adenocarcinoma of bronchioalveolar type in December 2010.  #2 Acute pulmonary embolism in the right lower lobe lobar, segmental and subsegmental sized pulmonary arteries, diagnosed on 11/04/2011.   PRIOR THERAPY:  1.Status post right upper lobectomy on September 28, 2009 under the care of Dr. Edwyna Shell. The patient refused adjuvant chemotherapy at that time. She had disease recurrence in June of 2012.  2.Status post 4 cycles of systemic chemotherapy with carboplatin for an AUC of 6 and paclitaxel at 200 mg per meter squared and Avastin at 15 mg/kg according to the ECOG protocol #5508.   CURRENT THERAPY: Chemotherapy with Avastin 15 mg/kg according to the ECOG protocol #5508 status post 9 cycles.   INTERVAL HISTORY: [name] 71 y.o. female returns to the clinic today for followup visit. She continues to have difficulty with persistent peripheral neuropathy affecting her feet and hand despite Lyrica 100 mg by mouth 3 times daily. She also complains of decreased range of motion and affecting both shoulders. She initially sought the shoulder pain was related to her Crestor and she stopped this medication. The discomfort however still persists. She reports that she fell on Sunday secondary to the rainy and wet weather. She had on a puffy winter coat and did not sustain any injury.  The patient denied having any significant chest pain or shortness of breath. She has no cough or hemoptysis. No significant weight loss. She continues to have fatigue  MEDICAL HISTORY: Past Medical History  Diagnosis Date  . Lung cancer 03/08/2011    recurrent  . Hypertension   . Hypothyroidism   .  Hypercholesterolemia   . Glaucoma   . Hip pain 09/02/2011  . Foot pain 09/02/2011    ALLERGIES:  is allergic to amitriptyline; codeine; and sulfa antibiotics.  MEDICATIONS:  Current Outpatient Prescriptions  Medication Sig Dispense Refill  . amLODipine (NORVASC) 5 MG tablet Take 5 mg by mouth daily. Per [name], NP       . citalopram (CELEXA) 40 MG tablet Take 40 mg by mouth daily.        . clonazePAM (KLONOPIN) 0.5 MG tablet Take 0.5 mg by mouth 2 (two) times daily as needed. Per [name], NP      . cyanocobalamin (,VITAMIN B-12,) 1000 MCG/ML injection Inject 1,000 mcg into the muscle every 30 (thirty) days.      . dorzolamide-timolol (COSOPT) 22.3-6.8 MG/ML ophthalmic solution 2 drops 2 (two) times daily. Per [name], NP       . levothyroxine (SYNTHROID, LEVOTHROID) 50 MCG tablet Take 50 mcg by mouth 4 (four) times a week. Per [name], NP       . levothyroxine (SYNTHROID, LEVOTHROID) 75 MCG tablet Take 75 mcg by mouth 3 (three) times a week. Per [name], NP       . lidocaine-prilocaine (EMLA) cream Apply 1 application topically as needed.        Marland Kitchen lisinopril (PRINIVIL,ZESTRIL) 40 MG tablet Take 40 mg by mouth daily. Per [name], NP       . pregabalin (LYRICA) 100 MG capsule Take 100 mg by mouth 3 (three) times daily.      . prochlorperazine (COMPAZINE) 10 MG tablet Take 10 mg by mouth  every 6 (six) hours as needed.        . rosuvastatin (CRESTOR) 10 MG tablet Take 10 mg by mouth daily.        Marland Kitchen warfarin (COUMADIN) 5 MG tablet Take 5 mg by mouth as directed. 5mg  daily except 2.5mg  on Saturdays and Mondays.        SURGICAL HISTORY:  Past Surgical History  Procedure Date  . Video assisted thoracoscopy 09/28/2009  . Thoracotomy 09/28/2009    mini  . Lung lobectomy 09/28/2009    right upper    REVIEW OF SYSTEMS:  A comprehensive review of systems was negative except for: Constitutional: positive for fatigue Musculoskeletal: positive for  arthralgias Neurological: positive for paresthesia   PHYSICAL EXAMINATION: General appearance: alert, cooperative and no distress Head: Normocephalic, without obvious abnormality, atraumatic Neck: no adenopathy Lymph nodes: Cervical, supraclavicular, and axillary nodes normal. Resp: clear to auscultation bilaterally Cardio: regular rate and rhythm, S1, S2 normal, no murmur, click, rub or gallop GI: soft, non-tender; bowel sounds normal; no masses,  no organomegaly Extremities: extremities normal, atraumatic, no cyanosis or edema Neurologic: Alert and oriented X 3, normal strength and tone. Normal symmetric reflexes. Normal coordination and gait Of note there is some decreased extension of the upper extremities greater on the right than on the left  ECOG PERFORMANCE STATUS: 1 - Symptomatic but completely ambulatory  Blood pressure 158/81, pulse 66, temperature 96.8 F (36 C), temperature source Oral, height 5\' 4"  (1.626 m), weight 131 lb 4.8 oz (59.557 kg).  LABORATORY DATA: Lab Results  Component Value Date   WBC 5.6 02/19/2012   HGB 13.6 02/19/2012   HCT 40.9 02/19/2012   MCV 86.7 02/19/2012   PLT 109* 02/19/2012      Chemistry      Component Value Date/Time   NA 137 02/19/2012 0953   NA 142 03/08/2011 0754   K 3.8 02/19/2012 0953   K 4.2 03/08/2011 0754   CL 99 02/19/2012 0953   CL 102 03/08/2011 0754   CO2 26 02/19/2012 0953   CO2 27 03/08/2011 0754   BUN 11 02/19/2012 0953   BUN 9 03/08/2011 0754   CREATININE 1.06 02/19/2012 0953   CREATININE 1.0 03/08/2011 0754      Component Value Date/Time   CALCIUM 10.0 02/19/2012 0953   CALCIUM 9.5 03/08/2011 0754   ALKPHOS 71 02/19/2012 0953   ALKPHOS 59 03/08/2011 0754   AST 11 02/19/2012 0953   AST 19 03/08/2011 0754   ALT 5 02/19/2012 0953   BILITOT 0.3 02/19/2012 0953   BILITOT 0.50 03/08/2011 0754       RADIOGRAPHIC STUDIES: Ct Head W Wo Contrast  01/23/2012  *RADIOLOGY REPORT*  Clinical Data: Lung cancer.  CT HEAD WITHOUT AND WITH CONTRAST   Technique:  Contiguous axial images were obtained from the base of the skull through the vertex without and with intravenous contrast.  Contrast: OMNIPAQUE IOHEXOL 300 MG/ML  SOLN  Comparison: 04/24/2011  Findings: There is no evidence for acute infarction, intracranial hemorrhage, mass lesion, hydrocephalus, or extra-axial fluid.  Mild atrophy.  Suspected mild to moderate chronic microvascular ischemic change.  Calvarium intact.  Vascular calcification.  Clear sinuses and mastoids.  No abnormal intracranial enhancement. Compared to priors, there is no change.  IMPRESSION: Stable exam.  Age related atrophy and small vessel disease.  No visible intracranial metastatic disease.  Original Report Authenticated By: Elsie Stain, M.D.   Ct Chest W Contrast  01/23/2012  *RADIOLOGY REPORT*  Clinical Data:  Metastatic lung cancer.  Restaging post completion of chemotherapy. RECIST protocol patient.  CT CHEST WITH CONTRAST  Technique:  Multidetector CT imaging of the chest was performed following the standard protocol during bolus administration of intravenous contrast.  Contrast: OMNIPAQUE IOHEXOL 300 MG/ML  SOLN  Comparison: Prior examinations 03/08/2011, 11/04/2011 and 12/11/2011.  Findings: RECIST protocol 1.1 lesions:  1.  Peripheral right lower lobe ground-glass opacity measures 1.7 cm on image 41, previously 2.0 cm. 2.  The dominant cystic appearing left lower lobe lesion with surrounding soft tissue components measures 4.3 cm on image 23, previously 4.4 cm.  There are stable postsurgical changes status post right upper lobe resection.  As demonstrated on prior studies, there are numerous other ground-glass opacities in both lungs.  These have not substantially changed.  No new or enlarging pulmonary nodules are identified.  There are no enlarged mediastinal or hilar lymph nodes. There are calcified left hilar lymph nodes and a calcified lingular granuloma.  There is no pleural or pericardial effusion.   Right IJ Port-A-Cath is unchanged.  Visualized upper abdomen has a stable appearance with multiple calcified granulomas in the spleen and liver.  A small hepatic cyst is stable.  There is no adrenal mass.  IMPRESSION:  1.  Stable postoperative findings status post right upper lobe resection. 2.  Stable multifocal ground-glass opacities in both lungs.  No enlarging nodules or increased solid components identified. Adenocarcinoma cannot be excluded.  Followup by CT is recommended in 12 months, with continued annual surveillance for a minimum of 3 years.  These recommendations are taken from "Recommendations for the Management of Subsolid Pulmonary Nodules Detected at CT:  A Statement from the Fleischner Society" Radiology 2013; 266:1, 304- 317. 3.  No lymphadenopathy or pleural effusion. No evidence of metastatic disease.  Original Report Authenticated By: Gerrianne Scale, M.D.    ASSESSMENT/PLAN: This is a very pleasant 71 years old [place] American female with recurrent non-small cell lung cancer. She is currently on maintenance Avastin status post 9 cycles and tolerating it fairly well. The patient was discussed with Dr. Arbutus Ped. She will proceed with cycle #10 of her maintenance Avastin as scheduled. Regarding her peripheral neuropathy and her decreased range of motion of the shoulders, we will refer her to a PT/OT for further evaluation and management. She will continue on her current medications including her Lyrica. She'll followup as per protocol and will have restaging CT of the chest on 03/05/2012 and followup labs as per protocol.  Laural Benes, Jasamine Pottinger E, PA-C   All questions were answered. The patient knows to call the clinic with any problems, questions or concerns. We can certainly see the patient much sooner if necessary.

## 2012-02-28 ENCOUNTER — Other Ambulatory Visit (HOSPITAL_BASED_OUTPATIENT_CLINIC_OR_DEPARTMENT_OTHER): Payer: Medicare Other | Admitting: Lab

## 2012-02-28 ENCOUNTER — Ambulatory Visit (HOSPITAL_BASED_OUTPATIENT_CLINIC_OR_DEPARTMENT_OTHER): Payer: Medicare Other | Admitting: Pharmacist

## 2012-02-28 DIAGNOSIS — I2699 Other pulmonary embolism without acute cor pulmonale: Secondary | ICD-10-CM

## 2012-02-28 LAB — PROTIME-INR

## 2012-02-28 NOTE — Progress Notes (Signed)
INR = 5.73 (by venopuncture; sent out for confirmation) Pt's only complaint is that she had diarrhea on Mother's Day weekend.  She ate a cookie from [employer] & after that, developed diarrhea.  She had decreased PO intake over the weekend as a result. She may have taken an extra dose or two of her Coumadin when she felt bad.  She says today that her "memory is just not as good" as before she was on chemotherapy. No bleeding.  Small bruise on LUE. INR is supratherapeutic.  Hold x 2 doses then back to usual dose.  This provides 20 mg total over 7 days.  We may need to keep her dose lowered below 27.5 mg/week total since she has trended upward the last 3 visits. Repeat protime on 5/23 prior to her scan at [facility]. She is aware of s/sxs of bleeding & will call us if she has problems before her next clinic visit. Marily Lente, Pharm.D.

## 2012-03-01 IMAGING — CT NM PET TUM IMG RESTAG (PS) SKULL BASE T - THIGH
1 of 6 series · 1 of 25 positions shown · IV contrast (350 OM)
Comparison: CT 03/08/2011, PET CT 09/14/2009

CLINICAL DATA: Subsequent treatment strategy for lung carcinoma.
Patient with prior right upper lobe bronchogenic carcinoma.  Right
upper lobe resection. Increased nodular thickening in the left
lower lobe.

NUCLEAR MEDICINE PET SKULL BASE TO THIGH
Fasting Blood Glucose:  96
TECHNIQUE: 18.4 mCi F-18 FDG was injected intravenously via the
right antecubital fossa.  Full-ring PET imaging was performed from
the skull base through the mid-thighs 54  minutes after injection.
CT data was obtained and used for attenuation correction and
anatomic localization only.  (This was not acquired as a diagnostic
CT examination.)

[Series 1: pet ac · axial · 3.3mm · 4.69mm/px · 1 of 223 slices shown]
[im 112/223]
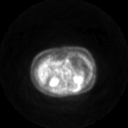

[1 of 25 positions shown; findings below may reference images not displayed]

FINDINGS: Neck:No hypermetabolic nodes in the neck.

Chest:The ground-glass lesion in the left lower lobe with
peripheral nodularity has low level metabolic activity ( SUV max =
2.3, image 56).  Similar smaller lesion in the right lower lobe
measuring 15 mm (image 78) with S U V max equal 1.7.  Left upper
lobe lesion also has of also has  low metabolic activity with SUV
max = 2.4.  No hypermetabolic mediastinal lymph nodes.

Abdomen / Pelvis:No abnormal hypermetabolic activity within the
solid organs.  No evidence of abdominal or pelvic hypermetabolic
nodes.

Skeleton:No focal hypermetabolic activity to suggest skeletal
metastasis.
IMPRESSION: 1.  Multiple foci of ground-glass opacity with peripheral
nodularity in the largest lesion in the left lower lobe.  These
lesions have very low metabolic activity.  Findings remain
concerning for a low metabolically active adenocarcinomas.
2.  No evidence of mediastinal or distant metastasis.

## 2012-03-04 ENCOUNTER — Telehealth: Payer: Self-pay | Admitting: Medical Oncology

## 2012-03-04 NOTE — Telephone Encounter (Signed)
Pt had upcoming CT and creatinine done for scan  pt has other lab orders in for day of scan and 1 week later . I will call lab to draw all the tests scheduled for 5/29 to draw on 5/23 or 5/29

## 2012-03-05 ENCOUNTER — Other Ambulatory Visit (HOSPITAL_BASED_OUTPATIENT_CLINIC_OR_DEPARTMENT_OTHER): Payer: Medicare Other

## 2012-03-05 ENCOUNTER — Ambulatory Visit (HOSPITAL_COMMUNITY)
Admission: RE | Admit: 2012-03-05 | Discharge: 2012-03-05 | Disposition: A | Discharge status: Home / Self Care | Payer: Medicare Other | Source: Ambulatory Visit | Attending: Internal Medicine | Admitting: Internal Medicine

## 2012-03-05 ENCOUNTER — Ambulatory Visit (HOSPITAL_BASED_OUTPATIENT_CLINIC_OR_DEPARTMENT_OTHER): Payer: Medicare Other | Admitting: Pharmacist

## 2012-03-05 DIAGNOSIS — C3490 Malignant neoplasm of unspecified part of unspecified bronchus or lung: Secondary | ICD-10-CM | POA: Insufficient documentation

## 2012-03-05 DIAGNOSIS — I2699 Other pulmonary embolism without acute cor pulmonale: Secondary | ICD-10-CM

## 2012-03-05 DIAGNOSIS — J984 Other disorders of lung: Secondary | ICD-10-CM | POA: Insufficient documentation

## 2012-03-05 LAB — PROTIME-INR

## 2012-03-05 MED ORDER — IOHEXOL 300 MG/ML  SOLN
80.0000 mL | Freq: Once | INTRAMUSCULAR | Status: AC | PRN
  Administered 2012-03-05: 80 mL via INTRAVENOUS

## 2012-03-05 NOTE — Progress Notes (Signed)
INR still elevated after holding 2 days last week.  Will hold coumadin x 2 days and decrease dose to 5mg  MWF and 2.5mg  other days.  Will see pt on 03/11/12 in infusion area.

## 2012-03-06 ENCOUNTER — Other Ambulatory Visit: Payer: Self-pay | Admitting: *Deleted

## 2012-03-06 DIAGNOSIS — C3490 Malignant neoplasm of unspecified part of unspecified bronchus or lung: Secondary | ICD-10-CM

## 2012-03-11 ENCOUNTER — Ambulatory Visit (HOSPITAL_BASED_OUTPATIENT_CLINIC_OR_DEPARTMENT_OTHER): Payer: Medicare Other

## 2012-03-11 ENCOUNTER — Ambulatory Visit: Payer: Medicare Other | Admitting: Internal Medicine

## 2012-03-11 ENCOUNTER — Telehealth: Payer: Self-pay | Admitting: Internal Medicine

## 2012-03-11 ENCOUNTER — Other Ambulatory Visit: Payer: Medicare Other

## 2012-03-11 ENCOUNTER — Telehealth: Payer: Self-pay | Admitting: *Deleted

## 2012-03-11 ENCOUNTER — Encounter: Payer: Medicare Other | Admitting: *Deleted

## 2012-03-11 ENCOUNTER — Other Ambulatory Visit: Payer: Medicare Other | Admitting: Lab

## 2012-03-11 ENCOUNTER — Ambulatory Visit (HOSPITAL_BASED_OUTPATIENT_CLINIC_OR_DEPARTMENT_OTHER): Payer: Medicare Other | Admitting: Pharmacist

## 2012-03-11 VITALS — BP 158/77 | HR 71 | Temp 97.7°F | Ht 64.0 in | Wt 134.7 lb

## 2012-03-11 VITALS — BP 143/83 | HR 66 | Temp 97.0°F

## 2012-03-11 DIAGNOSIS — C3490 Malignant neoplasm of unspecified part of unspecified bronchus or lung: Secondary | ICD-10-CM

## 2012-03-11 DIAGNOSIS — I2699 Other pulmonary embolism without acute cor pulmonale: Secondary | ICD-10-CM

## 2012-03-11 DIAGNOSIS — R5381 Other malaise: Secondary | ICD-10-CM

## 2012-03-11 DIAGNOSIS — Z5112 Encounter for antineoplastic immunotherapy: Secondary | ICD-10-CM

## 2012-03-11 LAB — PROTIME-INR: INR: 1.9 — ABNORMAL LOW (ref 2.00–3.50)

## 2012-03-11 LAB — CBC WITH DIFFERENTIAL/PLATELET
Basophils Absolute: 0 10*3/uL (ref 0.0–0.1)
EOS%: 1.6 % (ref 0.0–7.0)
Eosinophils Absolute: 0.1 10*3/uL (ref 0.0–0.5)
HGB: 13 g/dL (ref 11.6–15.9)
NEUT#: 3.7 10*3/uL (ref 1.5–6.5)
RDW: 15.5 % — ABNORMAL HIGH (ref 11.2–14.5)
WBC: 5.6 10*3/uL (ref 3.9–10.3)
lymph#: 1.4 10*3/uL (ref 0.9–3.3)

## 2012-03-11 LAB — MAGNESIUM: Magnesium: 2 mg/dL (ref 1.5–2.5)

## 2012-03-11 LAB — COMPREHENSIVE METABOLIC PANEL
Albumin: 3.4 g/dL — ABNORMAL LOW (ref 3.5–5.2)
Alkaline Phosphatase: 70 U/L (ref 39–117)
CO2: 27 mEq/L (ref 19–32)
Glucose, Bld: 97 mg/dL (ref 70–99)
Potassium: 3.5 mEq/L (ref 3.5–5.3)
Sodium: 139 mEq/L (ref 135–145)
Total Protein: 6.9 g/dL (ref 6.0–8.3)

## 2012-03-11 MED ORDER — SODIUM CHLORIDE 0.9 % IV SOLN
Freq: Once | INTRAVENOUS | Status: AC
  Administered 2012-03-11: 12:00:00 via INTRAVENOUS

## 2012-03-11 MED ORDER — SODIUM CHLORIDE 0.9 % IV SOLN
15.0000 mg/kg | Freq: Once | INTRAVENOUS | Status: AC
  Administered 2012-03-11: 900 mg via INTRAVENOUS
  Filled 2012-03-11: qty 36

## 2012-03-11 MED ORDER — HEPARIN SOD (PORK) LOCK FLUSH 100 UNIT/ML IV SOLN
500.0000 [IU] | Freq: Once | INTRAVENOUS | Status: AC | PRN
  Administered 2012-03-11: 500 [IU]
  Filled 2012-03-11: qty 5

## 2012-03-11 MED ORDER — SODIUM CHLORIDE 0.9 % IV SOLN
Freq: Once | INTRAVENOUS | Status: AC
  Administered 2012-03-11: 10:00:00 via INTRAVENOUS

## 2012-03-11 MED ORDER — SODIUM CHLORIDE 0.9 % IJ SOLN
10.0000 mL | INTRAMUSCULAR | Status: DC | PRN
  Administered 2012-03-11: 10 mL
  Filled 2012-03-11: qty 10

## 2012-03-11 NOTE — Telephone Encounter (Signed)
phy therpy r/s appt to 06/10. pt is aware of new appt d/t

## 2012-03-11 NOTE — Progress Notes (Signed)
INR = 1.9 after holding 2 doses this past week & dose decrease.  Overall in past 7 days, pt has received 15 mg total of Coumadin. Pt w/o complications re: anticoag. I will adjust her dose so she gets total of 22.5 mg/week--> 2.5 mg/day; 5 mg W/F. Recheck INR next Friday. Marily Lente, Pharm.D.

## 2012-03-11 NOTE — Progress Notes (Signed)
Patient in to clinic today for evaluation and maintenance Cycle 11 treatment. Patient reports that she was not contacted about PT referral; scheduler Maple Mirza to follow-up regarding referral to [facility]. Patient reports some mild swelling of her lower extremities. Based on lab results review and physical exam by Dr. Arbutus Ped, patient condition is acceptable for treatment today. Sign for infusion given to Vincent Peyer RN.

## 2012-03-11 NOTE — Telephone Encounter (Signed)
Per staff message from Eunice, I have scheduled treatment appt.   JMW  

## 2012-03-11 NOTE — Progress Notes (Signed)
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 This was initially diagnosed as stage IB (T2a N0 M0) well-differentiated adenocarcinoma of bronchioalveolar type in December 2010.  #2 Acute pulmonary embolism in the right lower lobe lobar, segmental and subsegmental sized pulmonary arteries, diagnosed on 11/04/2011.   PRIOR THERAPY:  1.Status post right upper lobectomy on September 28, 2009 under the care of Dr. Edwyna Shell. The patient refused adjuvant chemotherapy at that time. She had disease recurrence in June of 2012.  2.Status post 4 cycles of systemic chemotherapy with carboplatin for an AUC of 6 and paclitaxel at 200 mg per meter squared and Avastin at 15 mg/kg according to the ECOG protocol #5508.   CURRENT THERAPY: Chemotherapy with Avastin 15 mg/kg according to the ECOG protocol #5508 status post 9 cycles.   CODE STATUS: Full code.  INTERVAL HISTORY: [name] 71 y.o. female returns to the clinic today for followup visit. The patient continues to have numbness and tingling in in her fingers and toes. She is currently on Lyrica 100 mg by mouth 3 times a day. She also receive vitamin B12 injection 1000 mcg intramuscular on monthly basis. The patient is tolerating her treatment with the Avastin fairly well with no significant adverse effects. The patient has repeat CT scan of the chest performed recently and she is here today for evaluation and discussion of her scan results.  MEDICAL HISTORY: Past Medical History  Diagnosis Date  . Lung cancer 03/08/2011    recurrent  . Hypertension   . Hypothyroidism   . Hypercholesterolemia   . Glaucoma   . Hip pain 09/02/2011  . Foot pain 09/02/2011    ALLERGIES:  is allergic to amitriptyline; codeine; and sulfa antibiotics.  MEDICATIONS:  Current  Outpatient Prescriptions  Medication Sig Dispense Refill  . amLODipine (NORVASC) 5 MG tablet Take 5 mg by mouth daily. Per [name], NP       . citalopram (CELEXA) 40 MG tablet Take 40 mg by mouth daily.        . clonazePAM (KLONOPIN) 0.5 MG tablet Take 0.5 mg by mouth 2 (two) times daily as needed. Per [name], NP      . cyanocobalamin (,VITAMIN B-12,) 1000 MCG/ML injection Inject 1,000 mcg into the muscle every 30 (thirty) days.      . dorzolamide-timolol (COSOPT) 22.3-6.8 MG/ML ophthalmic solution 2 drops 2 (two) times daily. Per [name], NP       . levothyroxine (SYNTHROID, LEVOTHROID) 50 MCG tablet Take 50 mcg by mouth 4 (four) times a week. Per [name], NP       . levothyroxine (SYNTHROID, LEVOTHROID) 75 MCG tablet Take 75 mcg by mouth 3 (three) times a week. Per [name], NP       . lidocaine-prilocaine (EMLA) cream Apply 1 application topically as needed.        Marland Kitchen lisinopril (PRINIVIL,ZESTRIL) 40 MG tablet Take 40 mg by mouth daily. Per [name], NP       . pregabalin (LYRICA) 100 MG capsule Take 100 mg by mouth 3 (three) times daily.      . prochlorperazine (COMPAZINE) 10 MG tablet Take 10 mg by mouth every 6 (six) hours as needed.        . rosuvastatin (CRESTOR) 10 MG tablet Take 10 mg by mouth daily.        Marland Kitchen  warfarin (COUMADIN) 5 MG tablet Take 5 mg by mouth as directed. 5mg  daily except 2.5mg  on Saturdays and Mondays.        SURGICAL HISTORY:  Past Surgical History  Procedure Date  . Video assisted thoracoscopy 09/28/2009  . Thoracotomy 09/28/2009    mini  . Lung lobectomy 09/28/2009    right upper    REVIEW OF SYSTEMS:  A comprehensive review of systems was negative except for: Constitutional: positive for fatigue Neurological: positive for paresthesia   PHYSICAL EXAMINATION: General appearance: alert, cooperative and no distress Head: Normocephalic, without obvious abnormality, atraumatic Neck: no adenopathy Lymph nodes: Cervical,  supraclavicular, and axillary nodes normal. Resp: clear to auscultation bilaterally Cardio: regular rate and rhythm, S1, S2 normal, no murmur, click, rub or gallop GI: soft, non-tender; bowel sounds normal; no masses,  no organomegaly Extremities: extremities normal, atraumatic, no cyanosis or edema Neurologic: Alert and oriented X 3, normal strength and tone. Normal symmetric reflexes. Normal coordination and gait  ECOG PERFORMANCE STATUS: 1 - Symptomatic but completely ambulatory  Blood pressure 158/77, pulse 71, temperature 97.7 F (36.5 C), temperature source Oral, height 5\' 4"  (1.626 m), weight 134 lb 11.2 oz (61.1 kg).  LABORATORY DATA: Lab Results  Component Value Date   WBC 5.6 03/11/2012   HGB 13.0 03/11/2012   HCT 39.8 03/11/2012   MCV 88.9 03/11/2012   PLT 103* 03/11/2012      Chemistry      Component Value Date/Time   NA 139 03/11/2012 0855   NA 142 03/08/2011 0754   K 3.5 03/11/2012 0855   K 4.2 03/08/2011 0754   CL 102 03/11/2012 0855   CL 102 03/08/2011 0754   CO2 27 03/11/2012 0855   CO2 27 03/08/2011 0754   BUN 7 03/11/2012 0855   BUN 9 03/08/2011 0754   CREATININE 0.92 03/11/2012 0855   CREATININE 1.0 03/08/2011 0754      Component Value Date/Time   CALCIUM 9.6 03/11/2012 0855   CALCIUM 9.5 03/08/2011 0754   ALKPHOS 70 03/11/2012 0855   ALKPHOS 59 03/08/2011 0754   AST 9 03/11/2012 0855   AST 19 03/08/2011 0754   ALT 6 03/11/2012 0855   BILITOT 0.3 03/11/2012 0855   BILITOT 0.50 03/08/2011 0754       RADIOGRAPHIC STUDIES: Ct Chest W Contrast  03/05/2012  *RADIOLOGY REPORT*  Clinical Data: Lung cancer.  CT CHEST WITH CONTRAST  Technique:  Multidetector CT imaging of the chest was performed following the standard protocol during bolus administration of intravenous contrast.  Contrast: 80mL OMNIPAQUE IOHEXOL 300 MG/ML  SOLN  Comparison: 01/23/2012.  Findings: Sub-centimeter thyroid nodule(s) noted, too small to characterize, but most likely benign in the absence of known  clinical risk factors for thyroid carcinoma.  No pathologically enlarged mediastinal, hilar or axillary lymph nodes. Calcified left hilar lymph nodes.  Coronary artery calcification.  Heart size normal.  No pericardial effusion.  RECIST protocol  1.1 lesions: 1.  Peripheral right lower lobe ground-glass opacity, 1.7 cm, image 40, stable. 2.  Dominant cystic lesion with peripheral nodular solid components in the left lower lobe, 4.3 cm, image 23, stable.  Additional areas of focal irregular ground-glass are seen bilaterally and are grossly stable from 03/08/2011.  Postoperative changes of right upper lobectomy.  Calcified granuloma in the left upper lobe. No pleural fluid.  Airway is otherwise unremarkable.  Incidental imaging of the upper abdomen shows a sub centimeter low attenuation lesion in the right hepatic lobe, as before.  No  worrisome lytic or sclerotic lesions.  IMPRESSION:  1.  Stable cystic lesion with peripheral nodular components in the left lower lobe. 2.  Scattered bilateral irregular areas of ground-glass, stable. 3.  Continued follow-up is recommended as adenocarcinoma cannot be excluded.  Original Report Authenticated By: Reyes Ivan, M.D.    ASSESSMENT: This is a very pleasant 71 years old African American female was recurrent non-small cell lung cancer, adenocarcinoma. She status post chemotherapy with carboplatin, paclitaxel and Avastin and now on maintenance Avastin status post 10 cycles. The patient is doing fine and she has no evidence for disease progression.  PLAN: I discussed the scan results with the patient. I recommended for her to continue with the same treatment regimen for now with maintenance Avastin according to the clinical trial. The patient will receive cycle #11 today.  She'll come back for followup visit in 3 weeks for reevaluation before starting cycle #12. For peripheral neuropathy she will continue on Lyrica.    All questions were answered. The patient  knows to call the clinic with any problems, questions or concerns. We can certainly see the patient much sooner if necessary.  I spent 15 minutes counseling the patient face to face. The total time spent in the appointment was 25 minutes.

## 2012-03-11 NOTE — Telephone Encounter (Signed)
Gv pt appt for [id number].  called [facility] and scheduled pt for appt on06/02/2012 @ 10am.

## 2012-03-11 NOTE — Patient Instructions (Signed)
[facility] Discharge Instructions for Patients Receiving Chemotherapy  Today you received the following chemotherapy agents Avastin To help prevent nausea and vomiting after your treatment, we encourage you to take your nausea medication   Take it as often as prescribed.   If you develop nausea and vomiting that is not controlled by your nausea medication, call the clinic. If it is after clinic hours your family physician or the after hours number for the clinic or go to the Emergency Department.   BELOW ARE SYMPTOMS THAT SHOULD BE REPORTED IMMEDIATELY:  *FEVER GREATER THAN 100.5 F  *CHILLS WITH OR WITHOUT FEVER  NAUSEA AND VOMITING THAT IS NOT CONTROLLED WITH YOUR NAUSEA MEDICATION  *UNUSUAL SHORTNESS OF BREATH  *UNUSUAL BRUISING OR BLEEDING  TENDERNESS IN MOUTH AND THROAT WITH OR WITHOUT PRESENCE OF ULCERS  *URINARY PROBLEMS  *BOWEL PROBLEMS  UNUSUAL RASH Items with * indicate a potential emergency and should be followed up as soon as possible.  If this is your first treatment one of the nurses will contact you 24 hours after your treatment. Please let the nurse know about any problems that you may have experienced. Feel free to call the clinic you have any questions or concerns. The clinic phone number is [phone].   I have been informed and understand all the instructions given to me. I know to contact the clinic, my physician, or go to the Emergency Department if any problems should occur. I do not have any questions at this time, but understand that I may call the clinic during office hours   should I have any questions or need assistance in obtaining follow up care.    __________________________________________  _____________  __________ Signature of Patient or Authorized Representative            Date                   Time    __________________________________________ Nurse's Signature

## 2012-03-16 ENCOUNTER — Other Ambulatory Visit: Payer: Self-pay | Admitting: Pharmacist

## 2012-03-16 ENCOUNTER — Telehealth: Payer: Self-pay | Admitting: Medical Oncology

## 2012-03-16 DIAGNOSIS — I2699 Other pulmonary embolism without acute cor pulmonale: Secondary | ICD-10-CM

## 2012-03-16 MED ORDER — WARFARIN SODIUM 5 MG PO TABS: ORAL_TABLET | ORAL | Status: DC

## 2012-03-16 NOTE — Telephone Encounter (Addendum)
Thurston Hole stated pt called and wanted  to know when next " Arixtra" dose is due. Per Pharmacy pt is not on Arixtra . I called Thurston Hole back with information. I called pt and she asked me if she got vitamin b 12 at last chemo and when is she due again? I told her I will find out and call her back,

## 2012-03-17 ENCOUNTER — Telehealth: Payer: Self-pay | Admitting: Internal Medicine

## 2012-03-17 ENCOUNTER — Other Ambulatory Visit: Payer: Self-pay | Admitting: Pharmacist

## 2012-03-17 DIAGNOSIS — E538 Deficiency of other specified B group vitamins: Secondary | ICD-10-CM

## 2012-03-17 NOTE — Telephone Encounter (Signed)
Pt notified of next vit b 12 injection

## 2012-03-17 NOTE — Telephone Encounter (Signed)
lm that she does ned a b12 and will do it on 6/7 and to keep 6/19 appt    aom

## 2012-03-18 ENCOUNTER — Ambulatory Visit: Payer: Medicare Other | Admitting: Occupational Therapy

## 2012-03-20 ENCOUNTER — Ambulatory Visit: Payer: Medicare Other | Admitting: Physical Therapy

## 2012-03-20 ENCOUNTER — Other Ambulatory Visit (HOSPITAL_BASED_OUTPATIENT_CLINIC_OR_DEPARTMENT_OTHER): Payer: Medicare Other | Admitting: Lab

## 2012-03-20 ENCOUNTER — Encounter: Payer: Medicare Other | Admitting: Occupational Therapy

## 2012-03-20 ENCOUNTER — Ambulatory Visit (HOSPITAL_BASED_OUTPATIENT_CLINIC_OR_DEPARTMENT_OTHER): Payer: Medicare Other

## 2012-03-20 ENCOUNTER — Ambulatory Visit: Payer: Medicare Other | Admitting: Pharmacist

## 2012-03-20 VITALS — BP 149/83 | HR 61 | Temp 97.1°F

## 2012-03-20 DIAGNOSIS — I2699 Other pulmonary embolism without acute cor pulmonale: Secondary | ICD-10-CM

## 2012-03-20 DIAGNOSIS — E538 Deficiency of other specified B group vitamins: Secondary | ICD-10-CM

## 2012-03-20 LAB — PROTIME-INR: INR: 2.5 (ref 2.00–3.50)

## 2012-03-20 MED ORDER — CYANOCOBALAMIN 1000 MCG/ML IJ SOLN
1000.0000 ug | Freq: Once | INTRAMUSCULAR | Status: AC
  Administered 2012-03-20: 1000 ug via INTRAMUSCULAR

## 2012-03-20 NOTE — Progress Notes (Signed)
INR therapeutic today (2.5).  PCP started her on furosemide 20mg  daily to help with the swelling in her LE that has been troubling her.  She also increased her amlodipine dose.  These changes have been reflected in her medications record in Epic.  No missed doses of Coumadin.  No changes in medications.  No problems with bleeding/bruising besides 2 healing bruises on her thigh.  Will continue current dose of 2.5mg  daily except 5mg  on WF.  Recheck INR in 1 week to ensure that she stays in therapeutic range.  May increase interval between visits if her INR remains therapeutic.

## 2012-03-23 ENCOUNTER — Ambulatory Visit: Payer: Medicare Other | Admitting: Physical Therapy

## 2012-03-23 ENCOUNTER — Ambulatory Visit: Payer: Medicare Other | Attending: Physician Assistant | Admitting: Occupational Therapy

## 2012-03-23 DIAGNOSIS — M25619 Stiffness of unspecified shoulder, not elsewhere classified: Secondary | ICD-10-CM | POA: Insufficient documentation

## 2012-03-23 DIAGNOSIS — M25519 Pain in unspecified shoulder: Secondary | ICD-10-CM | POA: Insufficient documentation

## 2012-03-23 DIAGNOSIS — R269 Unspecified abnormalities of gait and mobility: Secondary | ICD-10-CM | POA: Insufficient documentation

## 2012-03-23 DIAGNOSIS — Z5189 Encounter for other specified aftercare: Secondary | ICD-10-CM | POA: Insufficient documentation

## 2012-03-23 DIAGNOSIS — R279 Unspecified lack of coordination: Secondary | ICD-10-CM | POA: Insufficient documentation

## 2012-03-23 DIAGNOSIS — M6281 Muscle weakness (generalized): Secondary | ICD-10-CM | POA: Insufficient documentation

## 2012-03-27 ENCOUNTER — Ambulatory Visit (HOSPITAL_BASED_OUTPATIENT_CLINIC_OR_DEPARTMENT_OTHER): Payer: Medicare Other | Admitting: Pharmacist

## 2012-03-27 ENCOUNTER — Other Ambulatory Visit (HOSPITAL_BASED_OUTPATIENT_CLINIC_OR_DEPARTMENT_OTHER): Payer: Medicare Other | Admitting: Lab

## 2012-03-27 DIAGNOSIS — I2699 Other pulmonary embolism without acute cor pulmonale: Secondary | ICD-10-CM

## 2012-03-27 LAB — PROTIME-INR: INR: 2.2 (ref 2.00–3.50)

## 2012-03-27 LAB — POCT INR: INR: 2.2

## 2012-03-27 NOTE — Progress Notes (Signed)
INR = 2.2 on 2.5 mg/day; 5 mg W/F No complaints today re: anticoag. Getting ready to start OT/PT.  She will be going Tues/Thurs.  The rehab center is near her house so it is better for her to come here for labs, etc on a day other than Tu/Th. INR at goal. Cont same dose. Return in 2 weeks.  I would have gone out a little longer than 2 weeks but she is going to visit her sister in [place] during the week of July 4th. Marily Lente, Pharm.D.

## 2012-04-01 ENCOUNTER — Ambulatory Visit (HOSPITAL_BASED_OUTPATIENT_CLINIC_OR_DEPARTMENT_OTHER): Payer: Medicare Other | Admitting: Physician Assistant

## 2012-04-01 ENCOUNTER — Other Ambulatory Visit (HOSPITAL_BASED_OUTPATIENT_CLINIC_OR_DEPARTMENT_OTHER): Payer: Medicare Other | Admitting: Lab

## 2012-04-01 ENCOUNTER — Encounter: Payer: Medicare Other | Admitting: *Deleted

## 2012-04-01 ENCOUNTER — Ambulatory Visit: Payer: Self-pay | Admitting: Pharmacist

## 2012-04-01 ENCOUNTER — Telehealth: Payer: Self-pay | Admitting: Internal Medicine

## 2012-04-01 ENCOUNTER — Encounter: Payer: Self-pay | Admitting: Physician Assistant

## 2012-04-01 ENCOUNTER — Ambulatory Visit (HOSPITAL_BASED_OUTPATIENT_CLINIC_OR_DEPARTMENT_OTHER): Payer: Medicare Other

## 2012-04-01 VITALS — BP 150/77 | HR 55 | Temp 96.9°F

## 2012-04-01 VITALS — BP 134/72 | HR 60 | Temp 96.7°F | Ht 64.0 in | Wt 132.0 lb

## 2012-04-01 DIAGNOSIS — C3410 Malignant neoplasm of upper lobe, unspecified bronchus or lung: Secondary | ICD-10-CM

## 2012-04-01 DIAGNOSIS — I2699 Other pulmonary embolism without acute cor pulmonale: Secondary | ICD-10-CM

## 2012-04-01 DIAGNOSIS — Z5112 Encounter for antineoplastic immunotherapy: Secondary | ICD-10-CM

## 2012-04-01 DIAGNOSIS — C3490 Malignant neoplasm of unspecified part of unspecified bronchus or lung: Secondary | ICD-10-CM

## 2012-04-01 DIAGNOSIS — G609 Hereditary and idiopathic neuropathy, unspecified: Secondary | ICD-10-CM

## 2012-04-01 LAB — POCT INR: INR: 2.6

## 2012-04-01 LAB — CBC WITH DIFFERENTIAL/PLATELET
BASO%: 1 % (ref 0.0–2.0)
Basophils Absolute: 0.1 10*3/uL (ref 0.0–0.1)
Eosinophils Absolute: 0.1 10*3/uL (ref 0.0–0.5)
HCT: 37.7 % (ref 34.8–46.6)
HGB: 12.4 g/dL (ref 11.6–15.9)
MONO#: 0.3 10*3/uL (ref 0.1–0.9)
NEUT#: 3.3 10*3/uL (ref 1.5–6.5)
NEUT%: 64.4 % (ref 38.4–76.8)
WBC: 5.2 10*3/uL (ref 3.9–10.3)
lymph#: 1.4 10*3/uL (ref 0.9–3.3)

## 2012-04-01 LAB — COMPREHENSIVE METABOLIC PANEL
AST: 10 U/L (ref 0–37)
BUN: 11 mg/dL (ref 6–23)
Calcium: 9.4 mg/dL (ref 8.4–10.5)
Chloride: 103 mEq/L (ref 96–112)
Creatinine, Ser: 1.01 mg/dL (ref 0.50–1.10)

## 2012-04-01 LAB — MAGNESIUM: Magnesium: 2.2 mg/dL (ref 1.5–2.5)

## 2012-04-01 LAB — PROTIME-INR

## 2012-04-01 MED ORDER — SODIUM CHLORIDE 0.9 % IV SOLN
Freq: Once | INTRAVENOUS | Status: AC
  Administered 2012-04-01: 12:00:00 via INTRAVENOUS

## 2012-04-01 MED ORDER — SODIUM CHLORIDE 0.9 % IV SOLN
15.0000 mg/kg | Freq: Once | INTRAVENOUS | Status: AC
  Administered 2012-04-01: 900 mg via INTRAVENOUS
  Filled 2012-04-01: qty 36

## 2012-04-01 MED ORDER — SODIUM CHLORIDE 0.9 % IJ SOLN
10.0000 mL | INTRAMUSCULAR | Status: DC | PRN
  Administered 2012-04-01: 10 mL
  Filled 2012-04-01: qty 10

## 2012-04-01 MED ORDER — SODIUM CHLORIDE 0.9 % IV SOLN
Freq: Once | INTRAVENOUS | Status: AC
  Administered 2012-04-01: 13:00:00 via INTRAVENOUS

## 2012-04-01 MED ORDER — HEPARIN SOD (PORK) LOCK FLUSH 100 UNIT/ML IV SOLN
500.0000 [IU] | Freq: Once | INTRAVENOUS | Status: AC | PRN
  Administered 2012-04-01: 500 [IU]
  Filled 2012-04-01: qty 5

## 2012-04-01 NOTE — Progress Notes (Signed)
INR = 2.6 on 2.5 mg/day; 5 mg Wed/Fri No complications noted.  Pt getting tx today. Pt was not originally scheduled to have INR checked today but lab drew it since she was here for chemo. INR on track so we will keep her dose the same. Return 04/20/12 for next protime (same day as next scan).  Pt aware of scheduled appt change for Coumadin clinic. Today's INR will be credited. Marily Lente, Pharm.D.

## 2012-04-01 NOTE — Progress Notes (Signed)
Patient in to clinic this morning for evaluation and treatment cycle 12. She reports that she has had a physical therapy evaluation and is scheduled to start outpatient therapy twice a week beginning next week. Based on lab results review and history and physical exam today by Tiana Loft PA, patient condition is acceptable for treatment.  Patient reports that she was given an alternative prescription for neuropathy that was called in to [facility] and placed on hold, by PCP [name] at [facility]. Copy of lab results from [facility] dated 03/17/2012 obtained from patient, and will contact [facility] for additional records.  Sign for infusion given to Amy Horton RN.

## 2012-04-01 NOTE — Telephone Encounter (Signed)
appts made and printed for pt aom °

## 2012-04-01 NOTE — Patient Instructions (Signed)
[facility] Discharge Instructions for Patients Receiving Chemotherapy  Today you received the following chemotherapy agents Avastin (Bevacizumab)  To help prevent nausea and vomiting after your treatment, we encourage you to take your nausea medication as prescribed.   If you develop nausea and vomiting that is not controlled by your nausea medication, call the clinic. If it is after clinic hours your family physician or the after hours number for the clinic or go to the Emergency Department.   BELOW ARE SYMPTOMS THAT SHOULD BE REPORTED IMMEDIATELY:  *FEVER GREATER THAN 100.5 F  *CHILLS WITH OR WITHOUT FEVER  NAUSEA AND VOMITING THAT IS NOT CONTROLLED WITH YOUR NAUSEA MEDICATION  *UNUSUAL SHORTNESS OF BREATH  *UNUSUAL BRUISING OR BLEEDING  TENDERNESS IN MOUTH AND THROAT WITH OR WITHOUT PRESENCE OF ULCERS  *URINARY PROBLEMS  *BOWEL PROBLEMS  UNUSUAL RASH Items with * indicate a potential emergency and should be followed up as soon as possible.  One of the nurses will contact you 24 hours after your treatment. Please let the nurse know about any problems that you may have experienced. Feel free to call the clinic you have any questions or concerns. The clinic phone number is [phone].   I have been informed and understand all the instructions given to me. I know to contact the clinic, my physician, or go to the Emergency Department if any problems should occur. I do not have any questions at this time, but understand that I may call the clinic during office hours   should I have any questions or need assistance in obtaining follow up care.    __________________________________________  _____________  __________ Signature of Patient or Authorized Representative            Date                   Time    __________________________________________ Nurse's Signature

## 2012-04-03 NOTE — Progress Notes (Signed)
[facility] [phone]   Fax:([phone]  OFFICE PROGRESS NOTE  [name],[name], [name]  DIAGNOSIS:  #1 Recurrent non-small cell lung cancer consistent with adenocarcinoma. This was initially diagnosed as stage IB (T2a N0 M0) well-differentiated adenocarcinoma of bronchioalveolar type in December 2010.  #2 Acute pulmonary embolism in the right lower lobe lobar, segmental and subsegmental sized pulmonary arteries, diagnosed on 11/04/2011.   PRIOR THERAPY:  1.Status post right upper lobectomy on September 28, 2009 under the care of Dr. Edwyna Shell. The patient refused adjuvant chemotherapy at that time. She had disease recurrence in June of 2012.  2.Status post 4 cycles of systemic chemotherapy with carboplatin for an AUC of 6 and paclitaxel at 200 mg per meter squared and Avastin at 15 mg/kg according to the ECOG protocol #5508.   CURRENT THERAPY: Chemotherapy with Avastin 15 mg/kg according to the ECOG protocol #5508 status post 9 cycles.   CODE STATUS: Full code.  INTERVAL HISTORY: [name] 71 y.o. female returns to the clinic today for followup visit. The patient continues to have numbness and tingling in in her fingers and toes. She was recently seen by her primary care practitioner, she's been on work for several months and is not noticed any difference in the level of her peripheral neuropathy. She may be changed to a different medication but does not remember the name of the new medication. She is willing to stay on the Lyrica until her next restaging CT scan of followup visit with Dr. Arbutus Ped. She reports that she finally had her physical therapy evaluation and she is to start physical therapy twice a week for her neck and shoulder discomfort and decreased range of motion. She states that she has been "working" on her feet. She has scoped him and then the scraping off the dead skin with a "straight razor". She states that this was a 3  hour process but she did get some improvement in the form of decreased pain after the process. The patient is tolerating her treatment with the Avastin fairly well with no significant adverse effects.   MEDICAL HISTORY: Past Medical History  Diagnosis Date  . Lung cancer 03/08/2011    recurrent  . Hypertension   . Hypothyroidism   . Hypercholesterolemia   . Glaucoma   . Hip pain 09/02/2011  . Foot pain 09/02/2011    ALLERGIES:  is allergic to amitriptyline; codeine; and sulfa antibiotics.  MEDICATIONS:  Current Outpatient Prescriptions  Medication Sig Dispense Refill  . amLODipine (NORVASC) 5 MG tablet Take 10 mg by mouth daily. Per [name], NP      . citalopram (CELEXA) 40 MG tablet Take 40 mg by mouth daily.        . clonazePAM (KLONOPIN) 0.5 MG tablet Take 0.5 mg by mouth 2 (two) times daily as needed. Per [name], NP      . cyanocobalamin (,VITAMIN B-12,) 1000 MCG/ML injection Inject 1,000 mcg into the muscle every 30 (thirty) days.      . dorzolamide-timolol (COSOPT) 22.3-6.8 MG/ML ophthalmic solution 2 drops 2 (two) times daily. Per [name], NP       . furosemide (LASIX) 20 MG tablet Take 1 tablet by mouth Daily.      Marland Kitchen levothyroxine (SYNTHROID, LEVOTHROID) 50 MCG tablet Take 50 mcg by mouth 4 (four) times a week. Per [name], NP       . levothyroxine (SYNTHROID, LEVOTHROID) 75 MCG tablet Take 75  mcg by mouth 3 (three) times a week. Per [name], NP       . lidocaine-prilocaine (EMLA) cream Apply 1 application topically as needed.        Marland Kitchen lisinopril (PRINIVIL,ZESTRIL) 40 MG tablet Take 40 mg by mouth daily. Per [name], NP       . pregabalin (LYRICA) 100 MG capsule Take 100 mg by mouth 3 (three) times daily.      . prochlorperazine (COMPAZINE) 10 MG tablet Take 10 mg by mouth every 6 (six) hours as needed.        . rosuvastatin (CRESTOR) 10 MG tablet Take 10 mg by mouth daily.        Marland Kitchen warfarin (COUMADIN) 5 MG tablet 2.5 mg daily except 5mg  on  Wednesday & Friday  30 tablet  4    SURGICAL HISTORY:  Past Surgical History  Procedure Date  . Video assisted thoracoscopy 09/28/2009  . Thoracotomy 09/28/2009    mini  . Lung lobectomy 09/28/2009    right upper    REVIEW OF SYSTEMS:  A comprehensive review of systems was negative except for: Constitutional: positive for fatigue Neurological: positive for paresthesia   PHYSICAL EXAMINATION: General appearance: alert, cooperative and no distress Head: Normocephalic, without obvious abnormality, atraumatic Neck: no adenopathy Lymph nodes: Cervical, supraclavicular, and axillary nodes normal. Resp: clear to auscultation bilaterally Cardio: regular rate and rhythm, S1, S2 normal, no murmur, click, rub or gallop GI: soft, non-tender; bowel sounds normal; no masses,  no organomegaly Extremities: extremities normal, atraumatic, no cyanosis or edema Neurologic: Alert and oriented X 3, normal strength and tone. Normal symmetric reflexes. Normal coordination and gait  ECOG PERFORMANCE STATUS: 1 - Symptomatic but completely ambulatory  Blood pressure 134/72, pulse 60, temperature 96.7 F (35.9 C), temperature source Oral, height 5\' 4"  (1.626 m), weight 132 lb (59.875 kg).  LABORATORY DATA: Lab Results  Component Value Date   WBC 5.2 04/01/2012   HGB 12.4 04/01/2012   HCT 37.7 04/01/2012   MCV 87.6 04/01/2012   PLT 111* 04/01/2012      Chemistry      Component Value Date/Time   NA 137 04/01/2012 1009   NA 142 03/08/2011 0754   K 3.6 04/01/2012 1009   K 4.2 03/08/2011 0754   CL 103 04/01/2012 1009   CL 102 03/08/2011 0754   CO2 26 04/01/2012 1009   CO2 27 03/08/2011 0754   BUN 11 04/01/2012 1009   BUN 9 03/08/2011 0754   CREATININE 1.01 04/01/2012 1009   CREATININE 1.0 03/08/2011 0754      Component Value Date/Time   CALCIUM 9.4 04/01/2012 1009   CALCIUM 9.5 03/08/2011 0754   ALKPHOS 65 04/01/2012 1009   ALKPHOS 59 03/08/2011 0754   AST 10 04/01/2012 1009   AST 19 03/08/2011 0754   ALT  <5 04/01/2012 1009   BILITOT 0.3 04/01/2012 1009   BILITOT 0.50 03/08/2011 0754       RADIOGRAPHIC STUDIES: Ct Chest W Contrast  03/05/2012  *RADIOLOGY REPORT*  Clinical Data: Lung cancer.  CT CHEST WITH CONTRAST  Technique:  Multidetector CT imaging of the chest was performed following the standard protocol during bolus administration of intravenous contrast.  Contrast: 80mL OMNIPAQUE IOHEXOL 300 MG/ML  SOLN  Comparison: 01/23/2012.  Findings: Sub-centimeter thyroid nodule(s) noted, too small to characterize, but most likely benign in the absence of known clinical risk factors for thyroid carcinoma.  No pathologically enlarged mediastinal, hilar or axillary lymph nodes. Calcified left hilar lymph  nodes.  Coronary artery calcification.  Heart size normal.  No pericardial effusion.  RECIST protocol  1.1 lesions: 1.  Peripheral right lower lobe ground-glass opacity, 1.7 cm, image 40, stable. 2.  Dominant cystic lesion with peripheral nodular solid components in the left lower lobe, 4.3 cm, image 23, stable.  Additional areas of focal irregular ground-glass are seen bilaterally and are grossly stable from 03/08/2011.  Postoperative changes of right upper lobectomy.  Calcified granuloma in the left upper lobe. No pleural fluid.  Airway is otherwise unremarkable.  Incidental imaging of the upper abdomen shows a sub centimeter low attenuation lesion in the right hepatic lobe, as before.  No worrisome lytic or sclerotic lesions.  IMPRESSION:  1.  Stable cystic lesion with peripheral nodular components in the left lower lobe. 2.  Scattered bilateral irregular areas of ground-glass, stable. 3.  Continued follow-up is recommended as adenocarcinoma cannot be excluded.  Original Report Authenticated By: [name], M.D.    ASSESSMENT/PLAN: This is a very pleasant 71 years old African American female was recurrent non-small cell lung cancer, adenocarcinoma. She status post chemotherapy with carboplatin,  paclitaxel and Avastin and now on maintenance Avastin status post 10 cycles. The patient is doing fine and she has no evidence for disease progression on her last CT scan.. the patient was discussed with Dr. Arbutus Ped. She will continue on her chemotherapy with Avastin at 15 mg per kilogram according to the ECoG protocol number 5508. She'll followup with Dr. Arbutus Ped on 04/22/2012 with a restaging CT scan and labs per protocol. As previously stated she will remain on the Lyrica until that visit at which time we will have information from her primary care practitioner as to what medication is being considered for her peripheral neuropathy symptoms. She is to proceed with her physical therapy as prescribed.  Laural Benes, Jameela Michna E, PA-C   All questions were answered. The patient knows to call the clinic with any problems, questions or concerns. We can certainly see the patient much sooner if necessary.  I spent 15 minutes counseling the patient face to face. The total time spent in the appointment was 25 minutes.

## 2012-04-07 ENCOUNTER — Ambulatory Visit: Payer: Medicare Other | Admitting: Physical Therapy

## 2012-04-07 ENCOUNTER — Ambulatory Visit: Payer: Medicare Other | Admitting: Occupational Therapy

## 2012-04-07 ENCOUNTER — Telehealth: Payer: Self-pay | Admitting: *Deleted

## 2012-04-07 NOTE — Telephone Encounter (Signed)
Left message on patient's cell phone voice mail (no answer and no answering machine at home number) to contact me regarding alternative medication for treatment of neuropathy recommended by [name] NP. Office note dated 03/17/2012 was received from [place] office at [facility], however, there was no reference to a different medication for this purpose. Left message for patient to contact me and let me know if she has a prescription for the new medication, and to let me know the name of the new medication.

## 2012-04-09 ENCOUNTER — Ambulatory Visit: Payer: Medicare Other | Admitting: Occupational Therapy

## 2012-04-09 ENCOUNTER — Ambulatory Visit: Payer: Medicare Other | Admitting: Physical Therapy

## 2012-04-14 ENCOUNTER — Ambulatory Visit: Payer: Medicare Other | Admitting: Occupational Therapy

## 2012-04-14 ENCOUNTER — Ambulatory Visit: Payer: Medicare Other | Attending: Physician Assistant | Admitting: Physical Therapy

## 2012-04-14 DIAGNOSIS — Z5189 Encounter for other specified aftercare: Secondary | ICD-10-CM | POA: Insufficient documentation

## 2012-04-14 DIAGNOSIS — M25519 Pain in unspecified shoulder: Secondary | ICD-10-CM | POA: Insufficient documentation

## 2012-04-14 DIAGNOSIS — R279 Unspecified lack of coordination: Secondary | ICD-10-CM | POA: Insufficient documentation

## 2012-04-14 DIAGNOSIS — R269 Unspecified abnormalities of gait and mobility: Secondary | ICD-10-CM | POA: Insufficient documentation

## 2012-04-14 DIAGNOSIS — M25619 Stiffness of unspecified shoulder, not elsewhere classified: Secondary | ICD-10-CM | POA: Insufficient documentation

## 2012-04-14 DIAGNOSIS — M6281 Muscle weakness (generalized): Secondary | ICD-10-CM | POA: Insufficient documentation

## 2012-04-15 ENCOUNTER — Telehealth: Payer: Self-pay | Admitting: *Deleted

## 2012-04-15 NOTE — Telephone Encounter (Signed)
Received voice mail message from patient today in response to last week's call. Patient states that she was started on a diuretic (furosemide) by her PCP. She states that two prescriptions were called in to her pharmacy later, but patient did not accept them. She states gabapentin was prescribed, but she "can't take that" and Vicodin, but she did not want to take that medication. Attempted to return call, but no answer at home, therefore will follow up with patient at next week's visit.

## 2012-04-20 ENCOUNTER — Other Ambulatory Visit (HOSPITAL_BASED_OUTPATIENT_CLINIC_OR_DEPARTMENT_OTHER): Payer: Medicare Other

## 2012-04-20 ENCOUNTER — Ambulatory Visit (HOSPITAL_BASED_OUTPATIENT_CLINIC_OR_DEPARTMENT_OTHER): Payer: Medicare Other | Admitting: Pharmacist

## 2012-04-20 ENCOUNTER — Other Ambulatory Visit: Payer: Self-pay | Admitting: *Deleted

## 2012-04-20 ENCOUNTER — Telehealth: Payer: Self-pay | Admitting: Pharmacist

## 2012-04-20 ENCOUNTER — Ambulatory Visit (HOSPITAL_COMMUNITY)
Admission: RE | Admit: 2012-04-20 | Discharge: 2012-04-20 | Disposition: A | Discharge status: Home / Self Care | Payer: Medicare Other | Source: Ambulatory Visit | Attending: Internal Medicine | Admitting: Internal Medicine

## 2012-04-20 DIAGNOSIS — C3490 Malignant neoplasm of unspecified part of unspecified bronchus or lung: Secondary | ICD-10-CM

## 2012-04-20 DIAGNOSIS — I2699 Other pulmonary embolism without acute cor pulmonale: Secondary | ICD-10-CM

## 2012-04-20 DIAGNOSIS — J984 Other disorders of lung: Secondary | ICD-10-CM | POA: Insufficient documentation

## 2012-04-20 LAB — POCT INR: INR: 3.1

## 2012-04-20 MED ORDER — IOHEXOL 300 MG/ML  SOLN
80.0000 mL | Freq: Once | INTRAMUSCULAR | Status: AC | PRN
  Administered 2012-04-20: 80 mL via INTRAVENOUS

## 2012-04-20 NOTE — Progress Notes (Signed)
INR = 3.1 on 2.5 mg/day; 5 mg Wed/Fri Pt fell today at home as she was leaving to come to our office.  She is in no pain.  She landed on L side.  Hit her hip & knee.  Her L knee is slightly red. Pt is able to ambulate. INR therapeutic.  Cont same dose until 7/21 when she will complete a planned 6 months of anticoagulation.  I have confirmed w/ Dr. Arbutus Ped. Pt is archived in Dose Response.  INR orders d/c'd.  Lab notified. 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 Marily Lente, Pharm.D.

## 2012-04-21 ENCOUNTER — Ambulatory Visit: Payer: Medicare Other | Admitting: Occupational Therapy

## 2012-04-21 ENCOUNTER — Ambulatory Visit: Payer: Medicare Other | Admitting: Physical Therapy

## 2012-04-22 ENCOUNTER — Other Ambulatory Visit: Payer: Self-pay | Admitting: Internal Medicine

## 2012-04-22 ENCOUNTER — Other Ambulatory Visit: Payer: Self-pay | Admitting: *Deleted

## 2012-04-22 ENCOUNTER — Encounter: Payer: Self-pay | Admitting: *Deleted

## 2012-04-22 ENCOUNTER — Ambulatory Visit (HOSPITAL_BASED_OUTPATIENT_CLINIC_OR_DEPARTMENT_OTHER): Payer: Medicare Other | Admitting: Internal Medicine

## 2012-04-22 ENCOUNTER — Ambulatory Visit: Payer: Medicare Other

## 2012-04-22 ENCOUNTER — Other Ambulatory Visit: Payer: Medicare Other | Admitting: Lab

## 2012-04-22 VITALS — BP 132/73 | HR 66 | Temp 97.2°F | Ht 64.0 in | Wt 128.8 lb

## 2012-04-22 DIAGNOSIS — C3490 Malignant neoplasm of unspecified part of unspecified bronchus or lung: Secondary | ICD-10-CM

## 2012-04-22 LAB — COMPREHENSIVE METABOLIC PANEL
ALT: 6 U/L (ref 0–35)
AST: 15 U/L (ref 0–37)
Albumin: 3.7 g/dL (ref 3.5–5.2)
BUN: 6 mg/dL (ref 6–23)
Calcium: 9.8 mg/dL (ref 8.4–10.5)
Chloride: 101 mEq/L (ref 96–112)
Potassium: 3.3 mEq/L — ABNORMAL LOW (ref 3.5–5.3)

## 2012-04-22 LAB — CBC WITH DIFFERENTIAL/PLATELET
BASO%: 1.1 % (ref 0.0–2.0)
Basophils Absolute: 0.1 10*3/uL (ref 0.0–0.1)
EOS%: 1.9 % (ref 0.0–7.0)
HGB: 12.7 g/dL (ref 11.6–15.9)
MCH: 28.9 pg (ref 25.1–34.0)
MONO#: 0.3 10*3/uL (ref 0.1–0.9)
RDW: 15.2 % — ABNORMAL HIGH (ref 11.2–14.5)
WBC: 5.2 10*3/uL (ref 3.9–10.3)
lymph#: 1.5 10*3/uL (ref 0.9–3.3)

## 2012-04-22 NOTE — Progress Notes (Signed)
April 22, 2012, E5506, cycle 13 [name] was in the clinic today for lab work physical exama nd treatment with Bevacizumab. She was seen by Dr. Arbutus Ped. Protein urine was 2+ so a stat UPC ratio was ordered per protocol and Dr. Arbutus Ped. Because of the time required to receive results of the UPC ratio, plan was made to treat on 04/23/12 if the result was WNL for treatment.  04/22/12 1600:UPC ratio WNL for treatment. Dr, Arbutus Ped notified and treatment plan signed.  April 23, 2012 Patient in the cancer center today for treatment with avastin. Sign for infusion given to C. Hyacinth Meeker, RN.

## 2012-04-22 NOTE — Progress Notes (Signed)
[facility] Telephone:([phone]   Fax:([phone]  OFFICE PROGRESS NOTE  [name],[name], [name]  DIAGNOSIS:  #1 Recurrent non-small cell lung cancer consistent with adenocarcinoma. This was initially diagnosed as stage IB (T2a N0 M0) well-differentiated adenocarcinoma of bronchioalveolar type in December 2010.  #2 Acute pulmonary embolism in the right lower lobe lobar, segmental and subsegmental sized pulmonary arteries, diagnosed on 11/04/2011.   PRIOR THERAPY:  1.Status post right upper lobectomy on September 28, 2009 under the care of Dr. Edwyna Shell. The patient refused adjuvant chemotherapy at that time. She had disease recurrence in June of 2012.  2.Status post 4 cycles of systemic chemotherapy with carboplatin for an AUC of 6 and paclitaxel at 200 mg per meter squared and Avastin at 15 mg/kg according to the ECOG protocol #5508.   CURRENT THERAPY: Chemotherapy with Avastin 15 mg/kg according to the ECOG protocol #5508 status post 10  cycles.   CODE STATUS: Full code.  INTERVAL HISTORY: [name] 71 y.o. female returns to the clinic today for followup visit. The patient continues to complain of fatigue and peripheral neuropathy. She denied having any significant chest pain or shortness breath, no cough or hemoptysis. She lost around 3 pounds since her last visit. She tolerated the last cycle of her maintenance treatment with Avastin fairly well. She has repeat CT scan of the chest performed recently and she is here today for evaluation and discussion of her scan results.  MEDICAL HISTORY: Past Medical History  Diagnosis Date  . Lung cancer 03/08/2011    recurrent  . Hypertension   . Hypothyroidism   . Hypercholesterolemia   . Glaucoma   . Hip pain 09/02/2011  . Foot pain 09/02/2011    ALLERGIES:  is allergic to amitriptyline; codeine; and sulfa antibiotics.  MEDICATIONS:  Current Outpatient Prescriptions  Medication Sig Dispense  Refill  . amLODipine (NORVASC) 5 MG tablet Take 10 mg by mouth daily. Per [name], NP      . citalopram (CELEXA) 40 MG tablet Take 40 mg by mouth daily.        . clonazePAM (KLONOPIN) 0.5 MG tablet Take 0.5 mg by mouth 2 (two) times daily as needed. Per [name], NP      . cyanocobalamin (,VITAMIN B-12,) 1000 MCG/ML injection Inject 1,000 mcg into the muscle every 30 (thirty) days.      . dorzolamide-timolol (COSOPT) 22.3-6.8 MG/ML ophthalmic solution 2 drops 2 (two) times daily. Per [name], NP       . furosemide (LASIX) 20 MG tablet Take 1 tablet by mouth Daily.      Marland Kitchen levothyroxine (SYNTHROID, LEVOTHROID) 50 MCG tablet Take 50 mcg by mouth 4 (four) times a week. Per [name], NP       . levothyroxine (SYNTHROID, LEVOTHROID) 75 MCG tablet Take 75 mcg by mouth 3 (three) times a week. Per [name], NP       . lidocaine-prilocaine (EMLA) cream Apply 1 application topically as needed.        Marland Kitchen lisinopril (PRINIVIL,ZESTRIL) 40 MG tablet Take 40 mg by mouth daily. Per [name], NP       . pregabalin (LYRICA) 100 MG capsule Take 100 mg by mouth 3 (three) times daily.      . prochlorperazine (COMPAZINE) 10 MG tablet Take 10 mg by mouth every 6 (six) hours as needed.        . rosuvastatin (CRESTOR) 10 MG tablet Take 10  mg by mouth daily.        Marland Kitchen warfarin (COUMADIN) 5 MG tablet 2.5 mg daily except 5mg  on Wednesday & Friday  30 tablet  4    SURGICAL HISTORY:  Past Surgical History  Procedure Date  . Video assisted thoracoscopy 09/28/2009  . Thoracotomy 09/28/2009    mini  . Lung lobectomy 09/28/2009    right upper    REVIEW OF SYSTEMS:  A comprehensive review of systems was negative except for: Constitutional: positive for fatigue Neurological: positive for paresthesia   PHYSICAL EXAMINATION: General appearance: alert, cooperative and no distress Head: Normocephalic, without obvious abnormality, atraumatic Neck: no adenopathy Lymph nodes: Cervical,  supraclavicular, and axillary nodes normal. Resp: clear to auscultation bilaterally Cardio: regular rate and rhythm, S1, S2 normal, no murmur, click, rub or gallop GI: soft, non-tender; bowel sounds normal; no masses,  no organomegaly Extremities: extremities normal, atraumatic, no cyanosis or edema Neurologic: Alert and oriented X 3, normal strength and tone. Normal symmetric reflexes. Normal coordination and gait  ECOG PERFORMANCE STATUS: 1 - Symptomatic but completely ambulatory  Blood pressure 132/73, pulse 66, temperature 97.2 F (36.2 C), temperature source Oral, height 5\' 4"  (1.626 m), weight 128 lb 12.8 oz (58.423 kg).  LABORATORY DATA: Lab Results  Component Value Date   WBC 5.2 04/22/2012   HGB 12.7 04/22/2012   HCT 38.2 04/22/2012   MCV 87.0 04/22/2012   PLT 114* 04/22/2012      Chemistry      Component Value Date/Time   NA 137 04/01/2012 1009   NA 142 03/08/2011 0754   K 3.6 04/01/2012 1009   K 4.2 03/08/2011 0754   CL 103 04/01/2012 1009   CL 102 03/08/2011 0754   CO2 26 04/01/2012 1009   CO2 27 03/08/2011 0754   BUN 11 04/01/2012 1009   BUN 9 03/08/2011 0754   CREATININE 1.01 04/01/2012 1009   CREATININE 1.0 03/08/2011 0754      Component Value Date/Time   CALCIUM 9.4 04/01/2012 1009   CALCIUM 9.5 03/08/2011 0754   ALKPHOS 65 04/01/2012 1009   ALKPHOS 59 03/08/2011 0754   AST 10 04/01/2012 1009   AST 19 03/08/2011 0754   ALT <5 04/01/2012 1009   BILITOT 0.3 04/01/2012 1009   BILITOT 0.50 03/08/2011 0754       RADIOGRAPHIC STUDIES: Ct Chest W Contrast  04/20/2012  *RADIOLOGY REPORT*  Clinical Data: Lung cancer.  Chemotherapy in progress.  CT CHEST WITH CONTRAST  Technique:  Multidetector CT imaging of the chest was performed following the standard protocol during bolus administration of intravenous contrast.  Contrast: 80mL OMNIPAQUE IOHEXOL 300 MG/ML  SOLN  Comparison: 03/05/2012.  Findings: Sub-centimeter thyroid nodule(s) noted, too small to characterize, but most likely  benign in the absence of known clinical risk factors for thyroid carcinoma.  No pathologically enlarged mediastinal, hilar or axillary lymph nodes. Calcified left hilar lymph nodes.  Atherosclerotic calcification of the arterial vasculature, including coronary arteries.  Heart size normal.  No pericardial effusion.  RECIST protocol 1.1 target lesions: 1.  Peripheral right lower lobe ground-glass opacity, 1.6 cm, image 41, stable. 2.  Dominant cystic lesion with peripheral nodular solid components in the left lower lobe, 4.3 cm, image 25, stable.  Non-target lesions: Scattered bilateral lung nodules, present on multiple images.  Emphysema.  Scarring and architectural distortion bilaterally with postoperative changes of right upper lobectomy.  Additional areas of focal irregular ground-glass are seen bilaterally and are grossly stable from 03/08/2011.  Calcified granuloma  the left upper lobe.  No pleural fluid.  Airway is unremarkable.  Incidental imaging of the upper abdomen shows a sub centimeter low attenuation lesion in the hepatic dome, stable.  No worrisome lytic or sclerotic lesions.  IMPRESSION:  1.  Stable cystic lesion with peripheral nodular components in the left lower lobe. 2.  Scattered bilateral irregular areas of ground-glass and architectural distortion, stable.  Original Report Authenticated By: Reyes Ivan, M.D.    ASSESSMENT: This is a very pleasant 71 years old white female with recurrent non-small cell lung cancer currently on maintenance Avastin status post 10 cycles. She is tolerating her treatment fairly well with no significant evidence for disease progression. Her urine protein dipstick today showed 2+.  PLAN: We will recheck her urine creatinine protein ratio and if it is normal, the patient will proceed with her maintenance treatment tomorrow as scheduled. It is still elevated we'll delay her treatment until next week was close monitoring of her urine protein. She would come  back for followup visit with the start of the next cycle of her treatment. The patient completed 6 months of treatment with Coumadin for acute pulmonary embolism. She would continue her treatment with Lyrica for the peripheral neuropathy and she is currently undergoing physical and occupational therapy She was advised to call me immediately if she has any concerning symptoms in the interval  All questions were answered. The patient knows to call the clinic with any problems, questions or concerns. We can certainly see the patient much sooner if necessary.  I spent 15 minutes counseling the patient face to face. The total time spent in the appointment was 25 minutes.

## 2012-04-23 ENCOUNTER — Ambulatory Visit: Payer: Medicare Other | Admitting: Physical Therapy

## 2012-04-23 ENCOUNTER — Ambulatory Visit: Payer: Medicare Other | Admitting: Occupational Therapy

## 2012-04-23 ENCOUNTER — Ambulatory Visit (HOSPITAL_BASED_OUTPATIENT_CLINIC_OR_DEPARTMENT_OTHER): Payer: Medicare Other

## 2012-04-23 ENCOUNTER — Telehealth: Payer: Self-pay | Admitting: Internal Medicine

## 2012-04-23 ENCOUNTER — Telehealth: Payer: Self-pay | Admitting: *Deleted

## 2012-04-23 VITALS — BP 151/71 | HR 54 | Temp 97.3°F

## 2012-04-23 DIAGNOSIS — E538 Deficiency of other specified B group vitamins: Secondary | ICD-10-CM

## 2012-04-23 DIAGNOSIS — Z5111 Encounter for antineoplastic chemotherapy: Secondary | ICD-10-CM

## 2012-04-23 DIAGNOSIS — C3490 Malignant neoplasm of unspecified part of unspecified bronchus or lung: Secondary | ICD-10-CM

## 2012-04-23 DIAGNOSIS — C3410 Malignant neoplasm of upper lobe, unspecified bronchus or lung: Secondary | ICD-10-CM

## 2012-04-23 MED ORDER — CYANOCOBALAMIN 1000 MCG/ML IJ SOLN
1000.0000 ug | Freq: Once | INTRAMUSCULAR | Status: AC
  Administered 2012-04-23: 1000 ug via INTRAMUSCULAR

## 2012-04-23 MED ORDER — SODIUM CHLORIDE 0.9 % IV SOLN
Freq: Once | INTRAVENOUS | Status: AC
  Administered 2012-04-23: 15:00:00 via INTRAVENOUS

## 2012-04-23 MED ORDER — BEVACIZUMAB CHEMO INJECTION 400 MG/16ML
15.0000 mg/kg | Freq: Once | INTRAVENOUS | Status: AC
  Administered 2012-04-23: 900 mg via INTRAVENOUS
  Filled 2012-04-23: qty 36

## 2012-04-23 MED ORDER — HEPARIN SOD (PORK) LOCK FLUSH 100 UNIT/ML IV SOLN
500.0000 [IU] | Freq: Once | INTRAVENOUS | Status: AC | PRN
  Administered 2012-04-23: 500 [IU]
  Filled 2012-04-23: qty 5

## 2012-04-23 MED ORDER — SODIUM CHLORIDE 0.9 % IJ SOLN
10.0000 mL | INTRAMUSCULAR | Status: DC | PRN
  Administered 2012-04-23: 10 mL
  Filled 2012-04-23: qty 10

## 2012-04-23 MED ORDER — SODIUM CHLORIDE 0.9 % IV SOLN
Freq: Once | INTRAVENOUS | Status: AC
  Administered 2012-04-23: 50 mL via INTRAVENOUS

## 2012-04-23 NOTE — Telephone Encounter (Signed)
Made patient appointment for 05-13-2012 starting at 10:15am

## 2012-04-23 NOTE — Telephone Encounter (Signed)
gv pt appt schedule for July.  

## 2012-04-23 NOTE — Patient Instructions (Addendum)
[facility] Discharge Instructions for Patients Receiving Chemotherapy  Today you received the following chemotherapy agents Avastin To help prevent nausea and vomiting after your treatment, we encourage you to take your nausea medication as prescribed.  If you develop nausea and vomiting that is not controlled by your nausea medication, call the clinic. If it is after clinic hours your family physician or the after hours number for the clinic or go to the Emergency Department.   BELOW ARE SYMPTOMS THAT SHOULD BE REPORTED IMMEDIATELY:  *FEVER GREATER THAN 100.5 F  *CHILLS WITH OR WITHOUT FEVER  NAUSEA AND VOMITING THAT IS NOT CONTROLLED WITH YOUR NAUSEA MEDICATION  *UNUSUAL SHORTNESS OF BREATH  *UNUSUAL BRUISING OR BLEEDING  TENDERNESS IN MOUTH AND THROAT WITH OR WITHOUT PRESENCE OF ULCERS  *URINARY PROBLEMS  *BOWEL PROBLEMS  UNUSUAL RASH Items with * indicate a potential emergency and should be followed up as soon as possible.  One of the nurses will contact you 24 hours after your treatment. Please let the nurse know about any problems that you may have experienced. Feel free to call the clinic you have any questions or concerns. The clinic phone number is [phone].   I have been informed and understand all the instructions given to me. I know to contact the clinic, my physician, or go to the Emergency Department if any problems should occur. I do not have any questions at this time, but understand that I may call the clinic during office hours   should I have any questions or need assistance in obtaining follow up care.    __________________________________________  _____________  __________ Signature of Patient or Authorized Representative            Date                   Time    __________________________________________ Nurse's Signature    

## 2012-04-27 ENCOUNTER — Telehealth: Payer: Self-pay | Admitting: *Deleted

## 2012-04-27 DIAGNOSIS — R3989 Other symptoms and signs involving the genitourinary system: Secondary | ICD-10-CM

## 2012-04-27 NOTE — Telephone Encounter (Signed)
Pt called stating that her urine was an orange color, she has no appetite and has had 2 loose stools of diarrhea.  Per Dr Donnald Garre, pt needs to come in for a UA and culture to r/o UTI.  She needs to eat small frequent meals and drink milkshakes, and ensure to help supplement and get the calories she needs.  Will make nutrition referral.  Encouraged OTC imodium to help with diarrhea.  Pt states she is drinking fluids and does not feel dehydrated at this time.  Pt is to call if symptoms worsen or change.  Pt also requested to come in tomorrow morning for the UA because she wants to make sure her diarrhea in under control before she goes out of the house.  Appt made for 10:00 on 7/16.  SLJ

## 2012-04-27 NOTE — Telephone Encounter (Signed)
[name] is a patient on the E5508 clinical trial receiving avastin maintenance therapy. She called to report her urine was orange-red, her appetite was gone, she had 2 small loose stools this morning and is very weak. She says she left a message on  Dr. Asa Lente nurse's voice mail this morning. I told her that was the right place to call. I also told her the urine discoloration was not related to the 2+ protein result last week.  She says she is drinking water and gingerale. I also told her I was sure someone would call her back but I would share this information with Dr. Asa Lente nurse.

## 2012-04-28 ENCOUNTER — Ambulatory Visit: Payer: Medicare Other | Admitting: Physical Therapy

## 2012-04-28 ENCOUNTER — Other Ambulatory Visit: Payer: Self-pay | Admitting: *Deleted

## 2012-04-28 ENCOUNTER — Telehealth: Payer: Self-pay | Admitting: *Deleted

## 2012-04-28 ENCOUNTER — Encounter: Payer: Medicare Other | Admitting: Occupational Therapy

## 2012-04-28 ENCOUNTER — Telehealth: Payer: Self-pay | Admitting: Internal Medicine

## 2012-04-28 ENCOUNTER — Other Ambulatory Visit (HOSPITAL_BASED_OUTPATIENT_CLINIC_OR_DEPARTMENT_OTHER): Payer: Medicare Other | Admitting: Lab

## 2012-04-28 DIAGNOSIS — R809 Proteinuria, unspecified: Secondary | ICD-10-CM

## 2012-04-28 DIAGNOSIS — N399 Disorder of urinary system, unspecified: Secondary | ICD-10-CM

## 2012-04-28 DIAGNOSIS — C3410 Malignant neoplasm of upper lobe, unspecified bronchus or lung: Secondary | ICD-10-CM

## 2012-04-28 DIAGNOSIS — E538 Deficiency of other specified B group vitamins: Secondary | ICD-10-CM

## 2012-04-28 DIAGNOSIS — R3989 Other symptoms and signs involving the genitourinary system: Secondary | ICD-10-CM

## 2012-04-28 LAB — URINALYSIS, MICROSCOPIC - CHCC
Glucose: NEGATIVE g/dL
Leukocyte Esterase: NEGATIVE
Nitrite: NEGATIVE
Protein: 30 mg/dL

## 2012-04-28 NOTE — Telephone Encounter (Signed)
l/m for her to call for nut appt    aom

## 2012-04-28 NOTE — Telephone Encounter (Signed)
UA results reviewed by Dr Donnald Garre, pt does not have a UTI.  She states that she urine is still an orange color but there is no odor, pain, or burning associated with it.  Pt encouraged to continue to drink plenty of fluids and to inform us if anything changes.  Pt verbalized understanding.  SLJ

## 2012-04-30 ENCOUNTER — Ambulatory Visit: Payer: Medicare Other | Admitting: Physical Therapy

## 2012-04-30 ENCOUNTER — Encounter: Payer: Medicare Other | Admitting: Occupational Therapy

## 2012-04-30 ENCOUNTER — Telehealth: Payer: Self-pay | Admitting: Internal Medicine

## 2012-04-30 LAB — URINE CULTURE

## 2012-04-30 NOTE — Telephone Encounter (Signed)
pt ret. call,called pt back and l/vm to call for nut appt    aom

## 2012-05-07 ENCOUNTER — Other Ambulatory Visit: Payer: Self-pay | Admitting: *Deleted

## 2012-05-07 ENCOUNTER — Ambulatory Visit: Payer: Medicare Other | Admitting: Physical Therapy

## 2012-05-07 DIAGNOSIS — C3490 Malignant neoplasm of unspecified part of unspecified bronchus or lung: Secondary | ICD-10-CM

## 2012-05-13 ENCOUNTER — Encounter: Payer: Self-pay | Admitting: Physician Assistant

## 2012-05-13 ENCOUNTER — Other Ambulatory Visit (HOSPITAL_BASED_OUTPATIENT_CLINIC_OR_DEPARTMENT_OTHER): Payer: Medicare Other | Admitting: Lab

## 2012-05-13 ENCOUNTER — Ambulatory Visit (HOSPITAL_BASED_OUTPATIENT_CLINIC_OR_DEPARTMENT_OTHER): Payer: Medicare Other | Admitting: Physician Assistant

## 2012-05-13 ENCOUNTER — Encounter: Payer: Medicare Other | Admitting: *Deleted

## 2012-05-13 ENCOUNTER — Ambulatory Visit (HOSPITAL_BASED_OUTPATIENT_CLINIC_OR_DEPARTMENT_OTHER): Payer: Medicare Other

## 2012-05-13 ENCOUNTER — Telehealth: Payer: Self-pay | Admitting: Internal Medicine

## 2012-05-13 VITALS — BP 117/63 | HR 86 | Temp 96.8°F | Ht 64.0 in | Wt 120.9 lb

## 2012-05-13 DIAGNOSIS — R5383 Other fatigue: Secondary | ICD-10-CM

## 2012-05-13 DIAGNOSIS — C3490 Malignant neoplasm of unspecified part of unspecified bronchus or lung: Secondary | ICD-10-CM

## 2012-05-13 DIAGNOSIS — E876 Hypokalemia: Secondary | ICD-10-CM

## 2012-05-13 DIAGNOSIS — C3410 Malignant neoplasm of upper lobe, unspecified bronchus or lung: Secondary | ICD-10-CM

## 2012-05-13 DIAGNOSIS — G609 Hereditary and idiopathic neuropathy, unspecified: Secondary | ICD-10-CM

## 2012-05-13 DIAGNOSIS — I2699 Other pulmonary embolism without acute cor pulmonale: Secondary | ICD-10-CM

## 2012-05-13 LAB — CBC WITH DIFFERENTIAL/PLATELET
BASO%: 1 % (ref 0.0–2.0)
EOS%: 1.2 % (ref 0.0–7.0)
HCT: 41.4 % (ref 34.8–46.6)
LYMPH%: 22.8 % (ref 14.0–49.7)
MCH: 29.7 pg (ref 25.1–34.0)
MCHC: 33.5 g/dL (ref 31.5–36.0)
MCV: 88.6 fL (ref 79.5–101.0)
MONO#: 0.4 10*3/uL (ref 0.1–0.9)
MONO%: 7.3 % (ref 0.0–14.0)
NEUT%: 67.7 % (ref 38.4–76.8)
Platelets: 118 10*3/uL — ABNORMAL LOW (ref 145–400)

## 2012-05-13 LAB — COMPREHENSIVE METABOLIC PANEL
ALT: <5 U/L (ref 0–35)
CO2: 30 mEq/L (ref 19–32)
Creatinine, Ser: 1.21 mg/dL — ABNORMAL HIGH (ref 0.50–1.10)
Total Bilirubin: 0.7 mg/dL (ref 0.3–1.2)

## 2012-05-13 LAB — UA PROTEIN, DIPSTICK - CHCC: Protein, ur: <30 mg/dL

## 2012-05-13 LAB — LACTATE DEHYDROGENASE: LDH: 166 U/L (ref 94–250)

## 2012-05-13 MED ORDER — HEPARIN SOD (PORK) LOCK FLUSH 100 UNIT/ML IV SOLN
500.0000 [IU] | Freq: Once | INTRAVENOUS | Status: AC
  Administered 2012-05-13: 500 [IU] via INTRAVENOUS
  Filled 2012-05-13: qty 5

## 2012-05-13 MED ORDER — POTASSIUM CHLORIDE CRYS ER 20 MEQ PO TBCR: 20.0000 meq | EXTENDED_RELEASE_TABLET | Freq: Two times a day (BID) | ORAL | Status: DC

## 2012-05-13 MED ORDER — SODIUM CHLORIDE 0.9 % IJ SOLN
10.0000 mL | INTRAMUSCULAR | Status: DC | PRN
  Administered 2012-05-13: 10 mL via INTRAVENOUS
  Filled 2012-05-13: qty 10

## 2012-05-13 MED ORDER — SODIUM CHLORIDE 0.9 % IV SOLN
INTRAVENOUS | Status: DC
  Administered 2012-05-13: 12:00:00 via INTRAVENOUS
  Filled 2012-05-13: qty 500

## 2012-05-13 MED ORDER — SODIUM CHLORIDE 0.9 % IV SOLN
INTRAVENOUS | Status: AC
  Administered 2012-05-13: 14:00:00 via INTRAVENOUS
  Filled 2012-05-13: qty 500

## 2012-05-13 MED ORDER — SODIUM CHLORIDE 0.9 % IV SOLN
INTRAVENOUS | Status: DC
  Administered 2012-05-13: 12:00:00 via INTRAVENOUS

## 2012-05-13 NOTE — Telephone Encounter (Signed)
Gave pt appt for CT, lab and MD , chemo for month of August 2013, [name] aware of all appts

## 2012-05-13 NOTE — Progress Notes (Signed)
Patient in to clinic today for evaluation prior to receiving treatment cycle 14. Patient reports that she discontinued Lyrica about two weeks ago after tapering, once Dr. Arbutus Ped gave her permission to do so, since she did not feel she was receiving benefit. 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 She did not want to take gabapentin as prescribed by PCP [name] as she has not had benefit from that medication in the past.  Patient reports that she has been too weak to continue PT at this time but can be referred back when her condition improves. She states she was instructed how to use a cane, however, she has not felt able to go to the store to purchase one. While patient condition based on physical exam was acceptable for treatment, notification was received from the laboratory regarding a panic value of 2.4 mEq/L for serum potassium. Due to this grade 4 abnormality, treatment with bevacizumab was held today, in lieu of patient receiving supplemental IV potassium. Infusion nurse Nelly Laurence notified of above.  Per Dr. Arbutus Ped, patient will be re-evaluated in one week and if condition is acceptable, will receive Cycle 14 treatment at that time. Patient notified of plan.

## 2012-05-13 NOTE — Patient Instructions (Signed)

## 2012-05-14 NOTE — Progress Notes (Signed)
[facility] [phone]   [phone]  OFFICE PROGRESS NOTE  [name],[name], [name]  DIAGNOSIS:  #1 Recurrent non-small cell lung cancer consistent with adenocarcinoma. This was initially diagnosed as stage IB (T2a N0 M0) well-differentiated adenocarcinoma of bronchioalveolar type in December 2010.  #2 Acute pulmonary embolism in the right lower lobe lobar, segmental and subsegmental sized pulmonary arteries, diagnosed on 11/04/2011.   PRIOR THERAPY:  1.Status post right upper lobectomy on September 28, 2009 under the care of Dr. Edwyna Shell. The patient refused adjuvant chemotherapy at that time. She had disease recurrence in June of 2012.  2.Status post 4 cycles of systemic chemotherapy with carboplatin for an AUC of 6 and paclitaxel at 200 mg per meter squared and Avastin at 15 mg/kg according to the ECOG protocol #5508.   CURRENT THERAPY: Chemotherapy with Avastin 15 mg/kg according to the ECOG protocol #5508 status post 13 cycles.   CODE STATUS: Full code.  INTERVAL HISTORY: [name] 71 y.o. female returns to the clinic today for followup visit. She complains of feeling very weak and is unable to eat 2 weeks ago. She also had some diarrhea during this time however this has resolved and she's had no further episodes. She's had some difficulty with her completing her physical therapy because of the weakness. She states that she was taught to walk with a 3-prong cane but has been unable to pick her cane up from the medical supply store her due to her weakness. She has discontinued the Lyrica as she did not notice any significant improvement in her peripheral neuropathy symptoms and has opted not to restart the gabapentin. She's also discontinued her Coumadin and she has completed 6 months of therapy as of approximately 05/03/2012. The patient continues to complain of fatigue and peripheral neuropathy. She denied having any significant  chest pain or shortness breath, no cough or hemoptysis.  She tolerated the last cycle of her maintenance treatment with Avastin fairly well.   MEDICAL HISTORY: Past Medical History  Diagnosis Date  . Lung cancer 03/08/2011    recurrent  . Hypertension   . Hypothyroidism   . Hypercholesterolemia   . Glaucoma   . Hip pain 09/02/2011  . Foot pain 09/02/2011    ALLERGIES:  is allergic to amitriptyline; codeine; and sulfa antibiotics.  MEDICATIONS:  Current Outpatient Prescriptions  Medication Sig Dispense Refill  . amLODipine (NORVASC) 5 MG tablet Take 10 mg by mouth daily. Per [name], NP      . citalopram (CELEXA) 40 MG tablet Take 40 mg by mouth daily.        . clonazePAM (KLONOPIN) 0.5 MG tablet Take 0.5 mg by mouth 2 (two) times daily as needed. Per [name], NP      . cyanocobalamin (,VITAMIN B-12,) 1000 MCG/ML injection Inject 1,000 mcg into the muscle every 30 (thirty) days.      . dorzolamide-timolol (COSOPT) 22.3-6.8 MG/ML ophthalmic solution 2 drops 2 (two) times daily. Per [name], NP       . furosemide (LASIX) 20 MG tablet Take 1 tablet by mouth Daily.      Marland Kitchen levothyroxine (SYNTHROID, LEVOTHROID) 50 MCG tablet Take 50 mcg by mouth 4 (four) times a week. Per [name], NP       . levothyroxine (SYNTHROID, LEVOTHROID) 75 MCG tablet Take 75 mcg by mouth 3 (three) times a week. Per [name], NP       . lidocaine-prilocaine (  EMLA) cream Apply 1 application topically as needed.        Marland Kitchen lisinopril (PRINIVIL,ZESTRIL) 40 MG tablet Take 40 mg by mouth daily. Per [name], NP       . potassium chloride SA (K-DUR,KLOR-CON) 20 MEQ tablet Take 1 tablet (20 mEq total) by mouth 2 (two) times daily.  14 tablet  0  . prochlorperazine (COMPAZINE) 10 MG tablet Take 10 mg by mouth every 6 (six) hours as needed.        . rosuvastatin (CRESTOR) 10 MG tablet Take 10 mg by mouth daily.         Current Facility-Administered Medications  Medication Dose Route Frequency  Provider Last Rate Last Dose  . sodium chloride 0.9 % 500 mL with potassium chloride 20 mEq infusion   Intravenous Continuous Conni Slipper, PA       Facility-Administered Medications Ordered in Other Visits  Medication Dose Route Frequency Provider Last Rate Last Dose  . DISCONTD: 0.9 %  sodium chloride infusion   Intravenous Continuous Si Gaul, MD      . DISCONTD: sodium chloride 0.9 % injection 10 mL  10 mL Intravenous PRN Si Gaul, MD   10 mL at 05/13/12 1653    SURGICAL HISTORY:  Past Surgical History  Procedure Date  . Video assisted thoracoscopy 09/28/2009  . Thoracotomy 09/28/2009    mini  . Lung lobectomy 09/28/2009    right upper    REVIEW OF SYSTEMS:  A comprehensive review of systems was negative except for: Constitutional: positive for fatigue Neurological: positive for paresthesia   PHYSICAL EXAMINATION: General appearance: alert, cooperative and no distress Head: Normocephalic, without obvious abnormality, atraumatic Neck: no adenopathy Lymph nodes: Cervical, supraclavicular, and axillary nodes normal. Resp: clear to auscultation bilaterally Cardio: regular rate and rhythm, S1, S2 normal, no murmur, click, rub or gallop GI: soft, non-tender; bowel sounds normal; no masses,  no organomegaly Extremities: extremities normal, atraumatic, no cyanosis or edema Neurologic: Alert and oriented X 3, normal strength and tone. Normal symmetric reflexes. Normal coordination and gait  ECOG PERFORMANCE STATUS: 1 - Symptomatic but completely ambulatory  Blood pressure 117/63, pulse 86, temperature 96.8 F (36 C), temperature source Oral, height 5\' 4"  (1.626 m), weight 120 lb 14.4 oz (54.84 kg).  LABORATORY DATA: Lab Results  Component Value Date   WBC 5.8 05/13/2012   HGB 13.9 05/13/2012   HCT 41.4 05/13/2012   MCV 88.6 05/13/2012   PLT 118* 05/13/2012      Chemistry      Component Value Date/Time   NA 138 05/13/2012 1014   NA 142 03/08/2011 0754   K 2.4*  05/13/2012 1014   K 4.2 03/08/2011 0754   CL 96 05/13/2012 1014   CL 102 03/08/2011 0754   CO2 30 05/13/2012 1014   CO2 27 03/08/2011 0754   BUN 5* 05/13/2012 1014   BUN 9 03/08/2011 0754   CREATININE 1.21* 05/13/2012 1014   CREATININE 1.0 03/08/2011 0754      Component Value Date/Time   CALCIUM 10.2 05/13/2012 1014   CALCIUM 9.5 03/08/2011 0754   ALKPHOS 73 05/13/2012 1014   ALKPHOS 59 03/08/2011 0754   AST 9 05/13/2012 1014   AST 19 03/08/2011 0754   ALT <5 05/13/2012 1014   BILITOT 0.7 05/13/2012 1014   BILITOT 0.50 03/08/2011 0754       RADIOGRAPHIC STUDIES: Ct Chest W Contrast  04/20/2012  *RADIOLOGY REPORT*  Clinical Data: Lung cancer.  Chemotherapy in progress.  CT  CHEST WITH CONTRAST  Technique:  Multidetector CT imaging of the chest was performed following the standard protocol during bolus administration of intravenous contrast.  Contrast: 80mL OMNIPAQUE IOHEXOL 300 MG/ML  SOLN  Comparison: 03/05/2012.  Findings: Sub-centimeter thyroid nodule(s) noted, too small to characterize, but most likely benign in the absence of known clinical risk factors for thyroid carcinoma.  No pathologically enlarged mediastinal, hilar or axillary lymph nodes. Calcified left hilar lymph nodes.  Atherosclerotic calcification of the arterial vasculature, including coronary arteries.  Heart size normal.  No pericardial effusion.  RECIST protocol 1.1 target lesions: 1.  Peripheral right lower lobe ground-glass opacity, 1.6 cm, image 41, stable. 2.  Dominant cystic lesion with peripheral nodular solid components in the left lower lobe, 4.3 cm, image 25, stable.  Non-target lesions: Scattered bilateral lung nodules, present on multiple images.  Emphysema.  Scarring and architectural distortion bilaterally with postoperative changes of right upper lobectomy.  Additional areas of focal irregular ground-glass are seen bilaterally and are grossly stable from 03/08/2011.  Calcified granuloma the left upper lobe.  No pleural fluid.   Airway is unremarkable.  Incidental imaging of the upper abdomen shows a sub centimeter low attenuation lesion in the hepatic dome, stable.  No worrisome lytic or sclerotic lesions.  IMPRESSION:  1.  Stable cystic lesion with peripheral nodular components in the left lower lobe. 2.  Scattered bilateral irregular areas of ground-glass and architectural distortion, stable.  Original Report Authenticated By: Reyes Ivan, M.D.    ASSESSMENT/PLAN: This is a very pleasant 71 years old white female with recurrent non-small cell lung cancer currently on maintenance Avastin status post 10 cycles. She is tolerating her treatment fairly well with no significant evidence for disease progression. Patient was discussed with Dr. Arbutus Ped Her potassium today was 2.4 and therefore her Avastin was held today. She was given a total of 40 mEq of potassium chloride in a liter of normal saline. A prescription for potassium chloride 40 mEq per day for 7 days was sent to her pharmacy of record via E. scribe. Her treatment with Avastin will be rescheduled for 1 week pending reevaluation of her potassium level. She'll followup with Dr. Arbutus Ped per protocol with restaging CT scan of the chest and repeat labs as per protocol.  Ester Mabe E, PA-C   She was advised to call me immediately if she has any concerning symptoms in the interval  All questions were answered. The patient knows to call the clinic with any problems, questions or concerns. We can certainly see the patient much sooner if necessary.  I spent 20 minutes counseling the patient face to face. The total time spent in the appointment was 30 minutes.

## 2012-05-20 ENCOUNTER — Ambulatory Visit (HOSPITAL_BASED_OUTPATIENT_CLINIC_OR_DEPARTMENT_OTHER): Payer: Medicare Other | Admitting: Physician Assistant

## 2012-05-20 ENCOUNTER — Encounter: Payer: Medicare Other | Admitting: *Deleted

## 2012-05-20 ENCOUNTER — Ambulatory Visit (HOSPITAL_BASED_OUTPATIENT_CLINIC_OR_DEPARTMENT_OTHER): Payer: Medicare Other

## 2012-05-20 ENCOUNTER — Telehealth: Payer: Self-pay | Admitting: Internal Medicine

## 2012-05-20 ENCOUNTER — Other Ambulatory Visit (HOSPITAL_BASED_OUTPATIENT_CLINIC_OR_DEPARTMENT_OTHER): Payer: Medicare Other | Admitting: Lab

## 2012-05-20 VITALS — BP 149/77 | HR 83 | Temp 96.8°F | Resp 20 | Ht 64.0 in | Wt 120.6 lb

## 2012-05-20 VITALS — BP 134/78 | HR 66 | Temp 97.4°F | Resp 17

## 2012-05-20 DIAGNOSIS — C3410 Malignant neoplasm of upper lobe, unspecified bronchus or lung: Secondary | ICD-10-CM

## 2012-05-20 DIAGNOSIS — C3490 Malignant neoplasm of unspecified part of unspecified bronchus or lung: Secondary | ICD-10-CM

## 2012-05-20 DIAGNOSIS — Z5112 Encounter for antineoplastic immunotherapy: Secondary | ICD-10-CM

## 2012-05-20 DIAGNOSIS — E538 Deficiency of other specified B group vitamins: Secondary | ICD-10-CM

## 2012-05-20 LAB — CBC WITH DIFFERENTIAL/PLATELET
Basophils Absolute: 0.1 10*3/uL (ref 0.0–0.1)
Eosinophils Absolute: 0.1 10*3/uL (ref 0.0–0.5)
HGB: 13.6 g/dL (ref 11.6–15.9)
MCV: 88.9 fL (ref 79.5–101.0)
MONO#: 0.4 10*3/uL (ref 0.1–0.9)
MONO%: 6.7 % (ref 0.0–14.0)
NEUT#: 3.6 10*3/uL (ref 1.5–6.5)
RDW: 15.8 % — ABNORMAL HIGH (ref 11.2–14.5)
WBC: 5.8 10*3/uL (ref 3.9–10.3)

## 2012-05-20 LAB — LACTATE DEHYDROGENASE: LDH: 199 U/L (ref 94–250)

## 2012-05-20 LAB — COMPREHENSIVE METABOLIC PANEL
Albumin: 3.9 g/dL (ref 3.5–5.2)
Alkaline Phosphatase: 71 U/L (ref 39–117)
BUN: 6 mg/dL (ref 6–23)
CO2: 26 mEq/L (ref 19–32)
Glucose, Bld: 90 mg/dL (ref 70–99)
Potassium: 4.4 mEq/L (ref 3.5–5.3)
Total Bilirubin: 0.7 mg/dL (ref 0.3–1.2)

## 2012-05-20 LAB — UA PROTEIN, DIPSTICK - CHCC: Protein, ur: <30 mg/dL

## 2012-05-20 LAB — MAGNESIUM: Magnesium: 2 mg/dL (ref 1.5–2.5)

## 2012-05-20 MED ORDER — CYANOCOBALAMIN 1000 MCG/ML IJ SOLN
1000.0000 ug | Freq: Once | INTRAMUSCULAR | Status: AC
  Administered 2012-05-20: 1000 ug via INTRAMUSCULAR

## 2012-05-20 MED ORDER — HEPARIN SOD (PORK) LOCK FLUSH 100 UNIT/ML IV SOLN
500.0000 [IU] | Freq: Once | INTRAVENOUS | Status: AC | PRN
  Administered 2012-05-20: 500 [IU]
  Filled 2012-05-20: qty 5

## 2012-05-20 MED ORDER — SODIUM CHLORIDE 0.9 % IV SOLN
Freq: Once | INTRAVENOUS | Status: AC
  Administered 2012-05-20: 16:00:00 via INTRAVENOUS

## 2012-05-20 MED ORDER — SODIUM CHLORIDE 0.9 % IV SOLN
15.0000 mg/kg | Freq: Once | INTRAVENOUS | Status: AC
  Administered 2012-05-20: 900 mg via INTRAVENOUS
  Filled 2012-05-20: qty 36

## 2012-05-20 MED ORDER — SODIUM CHLORIDE 0.9 % IJ SOLN
10.0000 mL | INTRAMUSCULAR | Status: DC | PRN
  Administered 2012-05-20: 10 mL
  Filled 2012-05-20: qty 10

## 2012-05-20 NOTE — Patient Instructions (Addendum)
[facility] Discharge Instructions for Patients Receiving Chemotherapy  Today you received the following chemotherapy agent Avastin.  To help prevent nausea and vomiting after your treatment, we encourage you to take your nausea medication. Begin taking it as often as prescribed for by Dr. Arbutus Ped.    If you develop nausea and vomiting that is not controlled by your nausea medication, call the clinic. If it is after clinic hours your family physician or the after hours number for the clinic or go to the Emergency Department.   BELOW ARE SYMPTOMS THAT SHOULD BE REPORTED IMMEDIATELY:  *FEVER GREATER THAN 100.5 F  *CHILLS WITH OR WITHOUT FEVER  NAUSEA AND VOMITING THAT IS NOT CONTROLLED WITH YOUR NAUSEA MEDICATION  *UNUSUAL SHORTNESS OF BREATH  *UNUSUAL BRUISING OR BLEEDING  TENDERNESS IN MOUTH AND THROAT WITH OR WITHOUT PRESENCE OF ULCERS  *URINARY PROBLEMS  *BOWEL PROBLEMS  UNUSUAL RASH Items with * indicate a potential emergency and should be followed up as soon as possible.  One of the nurses will contact you 24 hours after your treatment. Please let the nurse know about any problems that you may have experienced. Feel free to call the clinic you have any questions or concerns. The clinic phone number is [phone].   I have been informed and understand all the instructions given to me. I know to contact the clinic, my physician, or go to the Emergency Department if any problems should occur. I do not have any questions at this time, but understand that I may call the clinic during office hours   should I have any questions or need assistance in obtaining follow up care.    __________________________________________  _____________  __________ Signature of Patient or Authorized Representative            Date                   Time    __________________________________________ Nurse's Signature       BEVACIZUMAB (be va SIZ yoo mab) is a  chemotherapy drug. It targets a protein found in many cancer cell types, and halts cancer growth. This drug treats many cancers including non-small cell lung cancer, and colon or rectal cancer. It is usually given with other chemotherapy drugs. This medicine may be used for other purposes; ask your health care provider or pharmacist if you have questions. What should I tell my health care provider before I take this medicine? They need to know if you have any of these conditions: -blood clots -heart disease, including heart failure, heart attack, or chest pain (angina) -high blood pressure -infection (especially a virus infection such as chickenpox, cold sores, or herpes) -kidney disease -lung disease -prior chemotherapy with doxorubicin, daunorubicin, epirubicin, or other anthracycline type chemotherapy agents -recent or ongoing radiation therapy -recent surgery -stroke -an unusual or allergic reaction to bevacizumab, hamster proteins, mouse proteins, other medicines, foods, dyes, or preservatives -pregnant or trying to get pregnant -breast-feeding How should I use this medicine? This medicine is for infusion into a vein. It is given by a health care professional in a hospital or clinic setting. Talk to your pediatrician regarding the use of this medicine in children. Special care may be needed. Overdosage: If you think you have taken too much of this medicine contact a poison control center or emergency room at once. NOTE: This medicine is only for you. Do not share this medicine with others. What if I miss a dose? It is important not  to miss your dose. Call your doctor or health care professional if you are unable to keep an appointment. What may interact with this medicine? Interactions are not expected. This list may not describe all possible interactions. Give your health care provider a list of all the medicines, herbs, non-prescription drugs, or dietary supplements you use. Also  tell them if you smoke, drink alcohol, or use illegal drugs. Some items may interact with your medicine. What should I watch for while using this medicine? Your condition will be monitored carefully while you are receiving this medicine. You will need important blood work and urine testing done while you are taking this medicine. During your treatment, let your health care professional know if you have any unusual symptoms, such as difficulty breathing. This medicine may rarely cause 'gastrointestinal perforation' (holes in the stomach, intestines or colon), a serious side effect requiring surgery to repair. This medicine should be started at least 28 days following major surgery and the site of the surgery should be totally healed. Check with your doctor before scheduling dental work or surgery while you are receiving this treatment. Talk to your doctor if you have recently had surgery or if you have a wound that has not healed. Do not become pregnant while taking this medicine. Women should inform their doctor if they wish to become pregnant or think they might be pregnant. There is a potential for serious side effects to an unborn child. Talk to your health care professional or pharmacist for more information. Do not breast-feed an infant while taking this medicine. This medicine has caused ovarian failure in some women. This medicine may interfere with the ability to have a child. You should talk to your doctor or health care professional if you are concerned about your fertility. What side effects may I notice from receiving this medicine? Side effects that you should report to your doctor or health care professional as soon as possible: -allergic reactions like skin rash, itching or hives, swelling of the face, lips, or tongue -signs of infection - fever or chills, cough, sore throat, pain or trouble passing urine -signs of decreased platelets or bleeding - bruising, pinpoint red spots on the skin,  black, tarry stools, nosebleeds, blood in the urine -breathing problems -changes in vision -chest pain -confusion -jaw pain, especially after dental work -mouth sores -seizures -severe abdominal pain -severe headache -sudden numbness or weakness of the face, arm or leg -swelling of legs or ankles -symptoms of a stroke: change in mental awareness, inability to talk or move one side of the body (especially in patients with lung cancer) -trouble passing urine or change in the amount of urine -trouble speaking or understanding -trouble walking, dizziness, loss of balance or coordination Side effects that usually do not require medical attention (report to your doctor or health care professional if they continue or are bothersome): -constipation -diarrhea -dry skin -headache -loss of appetite -nausea, vomiting This list may not describe all possible side effects. Call your doctor for medical advice about side effects. You may report side effects to FDA at 1-800-FDA-1088. Where should I keep my medicine? This drug is given in a hospital or clinic and will not be stored at home. NOTE: This sheet is a summary. It may not cover all possible information. If you have questions about this medicine, talk to your doctor, pharmacist, or health care provider.  2012, [employer]. (08/31/2010 4:25:37 PM)

## 2012-05-20 NOTE — Progress Notes (Signed)
Patient in to clinic today for evaluation and Cycle 14 maintenance bevacizumab treatment. Based on lab results review, with resolution of hypokalemia noted, and history and physical exam by Tiana Loft PA, patient condition is acceptable for continued treatment. Upcoming appointments will be rescheduled accordingly, based on new cycle start date of today. Sign for infusion given to Rodena Goldmann.

## 2012-05-20 NOTE — Telephone Encounter (Signed)
gv pt appt schedule for August. Per 8/7 pof moved ct to 8/22 and lb/fu/tx/BN to 8/28.

## 2012-05-21 ENCOUNTER — Encounter: Payer: Self-pay | Admitting: Physician Assistant

## 2012-05-21 NOTE — Progress Notes (Signed)
[facility] Telephone:(336) [phone]   [phone]  OFFICE PROGRESS NOTE  [name],[name], [name]  DIAGNOSIS:  #1 Recurrent non-small cell lung cancer consistent with adenocarcinoma. This was initially diagnosed as stage IB (T2a N0 M0) well-differentiated adenocarcinoma of bronchioalveolar type in December 2010.  #2 Acute pulmonary embolism in the right lower lobe lobar, segmental and subsegmental sized pulmonary arteries, diagnosed on 11/04/2011.   PRIOR THERAPY:  1.Status post right upper lobectomy on September 28, 2009 under the care of Dr. Edwyna Shell. The patient refused adjuvant chemotherapy at that time. She had disease recurrence in June of 2012.  2.Status post 4 cycles of systemic chemotherapy with carboplatin for an AUC of 6 and paclitaxel at 200 mg per meter squared and Avastin at 15 mg/kg according to the ECOG protocol #5508.   CURRENT THERAPY: Chemotherapy with Avastin 15 mg/kg according to the ECOG protocol #5508 status post 13 cycles.   CODE STATUS: Full code.  INTERVAL HISTORY: [name] 71 y.o. female returns to the clinic today for followup visit. She returns for reevaluation of her potassium. She was found to have a very low potassium at 2.4 last week and was unable to receive her scheduled chemotherapy. She was given IV potassium replacement a total 40 mEq in 1 L of normal saline as well as a week of by mouth potassium chloride at 40 mEq daily. She tolerated the potassium repletion without difficulty. She voices no new complaints today.  She denied having any significant chest pain or shortness breath, no cough or hemoptysis.  She tolerated the last cycle of her maintenance treatment with Avastin fairly well.   MEDICAL HISTORY: Past Medical History  Diagnosis Date  . Lung cancer 03/08/2011    recurrent  . Hypertension   . Hypothyroidism   . Hypercholesterolemia   . Glaucoma   . Hip pain 09/02/2011  . Foot pain 09/02/2011     ALLERGIES:  is allergic to amitriptyline; codeine; and sulfa antibiotics.  MEDICATIONS:  Current Outpatient Prescriptions  Medication Sig Dispense Refill  . amLODipine (NORVASC) 5 MG tablet Take 10 mg by mouth daily. Per [name], NP      . citalopram (CELEXA) 40 MG tablet Take 40 mg by mouth daily.        . clonazePAM (KLONOPIN) 0.5 MG tablet Take 0.5 mg by mouth 2 (two) times daily as needed. Per [name], NP      . cyanocobalamin (,VITAMIN B-12,) 1000 MCG/ML injection Inject 1,000 mcg into the muscle every 30 (thirty) days.      . dorzolamide-timolol (COSOPT) 22.3-6.8 MG/ML ophthalmic solution 2 drops 2 (two) times daily. Per [name], NP       . furosemide (LASIX) 20 MG tablet Take 1 tablet by mouth Daily.       Marland Kitchen levothyroxine (SYNTHROID, LEVOTHROID) 50 MCG tablet Take 50 mcg by mouth 4 (four) times a week. Per [name], NP       . levothyroxine (SYNTHROID, LEVOTHROID) 75 MCG tablet Take 75 mcg by mouth 3 (three) times a week. Per [name], NP       . lidocaine-prilocaine (EMLA) cream Apply 1 application topically as needed.        Marland Kitchen lisinopril (PRINIVIL,ZESTRIL) 40 MG tablet Take 40 mg by mouth daily. Per [name], NP       . potassium chloride SA (K-DUR,KLOR-CON) 20 MEQ tablet Take 1 tablet (20 mEq total) by mouth 2 (two) times daily.  14 tablet  0  . prochlorperazine (COMPAZINE) 10 MG tablet Take 10 mg by mouth every 6 (six) hours as needed.        . rosuvastatin (CRESTOR) 10 MG tablet Take 10 mg by mouth daily.         No current facility-administered medications for this visit.   Facility-Administered Medications Ordered in Other Visits  Medication Dose Route Frequency Provider Last Rate Last Dose  . 0.9 %  sodium chloride infusion   Intravenous Once [employer], PA      . bevacizumab (AVASTIN) 900 mg in sodium chloride 0.9 % 100 mL chemo infusion  15 mg/kg (Order-Specific) Intravenous Once Conni Slipper, PA   900 mg at 05/20/12 1631  .  cyanocobalamin ((VITAMIN B-12)) injection 1,000 mcg  1,000 mcg Intramuscular Once Si Gaul, MD   1,000 mcg at 05/20/12 1651  . heparin lock flush 100 unit/mL  500 Units Intracatheter Once PRN Conni Slipper, PA   500 Units at 05/20/12 1711  . DISCONTD: sodium chloride 0.9 % injection 10 mL  10 mL Intracatheter PRN Conni Slipper, PA   10 mL at 05/20/12 1711    SURGICAL HISTORY:  Past Surgical History  Procedure Date  . Video assisted thoracoscopy 09/28/2009  . Thoracotomy 09/28/2009    mini  . Lung lobectomy 09/28/2009    right upper    REVIEW OF SYSTEMS:  A comprehensive review of systems was negative except for: Constitutional: positive for fatigue Neurological: positive for paresthesia   PHYSICAL EXAMINATION: General appearance: alert, cooperative and no distress Head: Normocephalic, without obvious abnormality, atraumatic Neck: no adenopathy Lymph nodes: Cervical, supraclavicular, and axillary nodes normal. Resp: clear to auscultation bilaterally Cardio: regular rate and rhythm, S1, S2 normal, no murmur, click, rub or gallop GI: soft, non-tender; bowel sounds normal; no masses,  no organomegaly Extremities: extremities normal, atraumatic, no cyanosis or edema Neurologic: Alert and oriented X 3, normal strength and tone. Normal symmetric reflexes. Normal coordination and gait  ECOG PERFORMANCE STATUS: 1 - Symptomatic but completely ambulatory  Blood pressure 149/77, pulse 83, temperature 96.8 F (36 C), temperature source Oral, resp. rate 20, height 5\' 4"  (1.626 m), weight 120 lb 9.6 oz (54.704 kg).  LABORATORY DATA: Lab Results  Component Value Date   WBC 5.8 05/20/2012   HGB 13.6 05/20/2012   HCT 40.9 05/20/2012   MCV 88.9 05/20/2012   PLT 121* 05/20/2012      Chemistry      Component Value Date/Time   NA 136 05/20/2012 1416   NA 142 03/08/2011 0754   K 4.4 05/20/2012 1416   K 4.2 03/08/2011 0754   CL 99 05/20/2012 1416   CL 102 03/08/2011 0754   CO2 26 05/20/2012 1416     CO2 27 03/08/2011 0754   BUN 6 05/20/2012 1416   BUN 9 03/08/2011 0754   CREATININE 1.11* 05/20/2012 1416   CREATININE 1.0 03/08/2011 0754      Component Value Date/Time   CALCIUM 10.3 05/20/2012 1416   CALCIUM 9.5 03/08/2011 0754   ALKPHOS 71 05/20/2012 1416   ALKPHOS 59 03/08/2011 0754   AST 9 05/20/2012 1416   AST 19 03/08/2011 0754   ALT <5 05/20/2012 1416   BILITOT 0.7 05/20/2012 1416   BILITOT 0.50 03/08/2011 0754       RADIOGRAPHIC STUDIES: Ct Chest W Contrast  04/20/2012  *RADIOLOGY REPORT*  Clinical Data: Lung cancer.  Chemotherapy in progress.  CT CHEST WITH CONTRAST  Technique:  Multidetector CT imaging of the chest was performed following the standard protocol during bolus administration of intravenous contrast.  Contrast: 80mL OMNIPAQUE IOHEXOL 300 MG/ML  SOLN  Comparison: 03/05/2012.  Findings: Sub-centimeter thyroid nodule(s) noted, too small to characterize, but most likely benign in the absence of known clinical risk factors for thyroid carcinoma.  No pathologically enlarged mediastinal, hilar or axillary lymph nodes. Calcified left hilar lymph nodes.  Atherosclerotic calcification of the arterial vasculature, including coronary arteries.  Heart size normal.  No pericardial effusion.  RECIST protocol 1.1 target lesions: 1.  Peripheral right lower lobe ground-glass opacity, 1.6 cm, image 41, stable. 2.  Dominant cystic lesion with peripheral nodular solid components in the left lower lobe, 4.3 cm, image 25, stable.  Non-target lesions: Scattered bilateral lung nodules, present on multiple images.  Emphysema.  Scarring and architectural distortion bilaterally with postoperative changes of right upper lobectomy.  Additional areas of focal irregular ground-glass are seen bilaterally and are grossly stable from 03/08/2011.  Calcified granuloma the left upper lobe.  No pleural fluid.  Airway is unremarkable.  Incidental imaging of the upper abdomen shows a sub centimeter low attenuation lesion in the  hepatic dome, stable.  No worrisome lytic or sclerotic lesions.  IMPRESSION:  1.  Stable cystic lesion with peripheral nodular components in the left lower lobe. 2.  Scattered bilateral irregular areas of ground-glass and architectural distortion, stable.  Original Report Authenticated By: Reyes Ivan, M.D.    ASSESSMENT/PLAN: This is a very pleasant 71 years old white female with recurrent non-small cell lung cancer currently on maintenance Avastin status post 10 cycles. She is tolerating her treatment fairly well with no significant evidence for disease progression. Patient was discussed with Dr. Arbutus Ped Her potassium today was 4.4 and therefore may proceed with her Avastin today..  She'll followup with Dr. Arbutus Ped per protocol with restaging CT scan of the chest and repeat labs as per protocol.  Nyemah Watton E, PA-C   She was advised to call me immediately if she has any concerning symptoms in the interval  All questions were answered. The patient knows to call the clinic with any problems, questions or concerns. We can certainly see the patient much sooner if necessary.  I spent 20 minutes counseling the patient face to face. The total time spent in the appointment was 30 minutes.

## 2012-05-28 ENCOUNTER — Other Ambulatory Visit (HOSPITAL_COMMUNITY): Payer: Medicare Other

## 2012-06-02 ENCOUNTER — Ambulatory Visit: Payer: Medicare Other | Admitting: Internal Medicine

## 2012-06-02 ENCOUNTER — Other Ambulatory Visit: Payer: Medicare Other | Admitting: Lab

## 2012-06-03 ENCOUNTER — Encounter: Payer: Medicare Other | Admitting: Nutrition

## 2012-06-03 ENCOUNTER — Ambulatory Visit: Payer: Medicare Other

## 2012-06-04 ENCOUNTER — Ambulatory Visit (HOSPITAL_COMMUNITY)
Admission: RE | Admit: 2012-06-04 | Discharge: 2012-06-04 | Disposition: A | Discharge status: Home / Self Care | Payer: Medicare Other | Source: Ambulatory Visit | Attending: Physician Assistant | Admitting: Physician Assistant

## 2012-06-04 DIAGNOSIS — Z79899 Other long term (current) drug therapy: Secondary | ICD-10-CM | POA: Insufficient documentation

## 2012-06-04 DIAGNOSIS — C3490 Malignant neoplasm of unspecified part of unspecified bronchus or lung: Secondary | ICD-10-CM

## 2012-06-04 DIAGNOSIS — C73 Malignant neoplasm of thyroid gland: Secondary | ICD-10-CM | POA: Insufficient documentation

## 2012-06-04 DIAGNOSIS — J984 Other disorders of lung: Secondary | ICD-10-CM | POA: Insufficient documentation

## 2012-06-04 MED ORDER — IOHEXOL 300 MG/ML  SOLN
80.0000 mL | Freq: Once | INTRAMUSCULAR | Status: AC | PRN
  Administered 2012-06-04: 80 mL via INTRAVENOUS

## 2012-06-10 ENCOUNTER — Ambulatory Visit: Payer: Medicare Other | Admitting: Nutrition

## 2012-06-10 ENCOUNTER — Encounter: Payer: Medicare Other | Admitting: *Deleted

## 2012-06-10 ENCOUNTER — Telehealth: Payer: Self-pay | Admitting: Internal Medicine

## 2012-06-10 ENCOUNTER — Other Ambulatory Visit (HOSPITAL_BASED_OUTPATIENT_CLINIC_OR_DEPARTMENT_OTHER): Payer: Medicare Other | Admitting: Lab

## 2012-06-10 ENCOUNTER — Ambulatory Visit (HOSPITAL_BASED_OUTPATIENT_CLINIC_OR_DEPARTMENT_OTHER): Payer: Medicare Other

## 2012-06-10 ENCOUNTER — Ambulatory Visit (HOSPITAL_BASED_OUTPATIENT_CLINIC_OR_DEPARTMENT_OTHER): Payer: Medicare Other | Admitting: Internal Medicine

## 2012-06-10 VITALS — BP 122/73 | HR 77 | Temp 96.1°F | Resp 18 | Ht 64.0 in | Wt 116.8 lb

## 2012-06-10 DIAGNOSIS — E876 Hypokalemia: Secondary | ICD-10-CM

## 2012-06-10 DIAGNOSIS — C3490 Malignant neoplasm of unspecified part of unspecified bronchus or lung: Secondary | ICD-10-CM

## 2012-06-10 DIAGNOSIS — R634 Abnormal weight loss: Secondary | ICD-10-CM

## 2012-06-10 DIAGNOSIS — C3410 Malignant neoplasm of upper lobe, unspecified bronchus or lung: Secondary | ICD-10-CM

## 2012-06-10 DIAGNOSIS — Z5112 Encounter for antineoplastic immunotherapy: Secondary | ICD-10-CM

## 2012-06-10 LAB — COMPREHENSIVE METABOLIC PANEL (CC13)
AST: 9 U/L (ref 5–34)
Albumin: 3.8 g/dL (ref 3.5–5.0)
BUN: 10 mg/dL (ref 7.0–26.0)
CO2: 26 mEq/L (ref 22–29)
Calcium: 9.3 mg/dL (ref 8.4–10.4)
Chloride: 103 mEq/L (ref 98–107)
Potassium: 3.1 mEq/L — ABNORMAL LOW (ref 3.5–5.1)
Total Protein: 7.3 g/dL (ref 6.4–8.3)

## 2012-06-10 LAB — CBC WITH DIFFERENTIAL/PLATELET
BASO%: 1 % (ref 0.0–2.0)
Eosinophils Absolute: 0.1 10*3/uL (ref 0.0–0.5)
HCT: 40.7 % (ref 34.8–46.6)
HGB: 13.5 g/dL (ref 11.6–15.9)
LYMPH%: 25.3 % (ref 14.0–49.7)
MCHC: 33.2 g/dL (ref 31.5–36.0)
MONO#: 0.3 10*3/uL (ref 0.1–0.9)
NEUT#: 3.9 10*3/uL (ref 1.5–6.5)
NEUT%: 67.2 % (ref 38.4–76.8)
Platelets: 130 10*3/uL — ABNORMAL LOW (ref 145–400)
WBC: 5.8 10*3/uL (ref 3.9–10.3)
lymph#: 1.5 10*3/uL (ref 0.9–3.3)

## 2012-06-10 LAB — MAGNESIUM (CC13): Magnesium: 2.5 mg/dl (ref 1.5–2.5)

## 2012-06-10 MED ORDER — SODIUM CHLORIDE 0.9 % IV SOLN: Freq: Once | INTRAVENOUS | Status: DC

## 2012-06-10 MED ORDER — SODIUM CHLORIDE 0.9 % IJ SOLN
10.0000 mL | INTRAMUSCULAR | Status: DC | PRN
  Administered 2012-06-10: 10 mL
  Filled 2012-06-10: qty 10

## 2012-06-10 MED ORDER — HEPARIN SOD (PORK) LOCK FLUSH 100 UNIT/ML IV SOLN
500.0000 [IU] | Freq: Once | INTRAVENOUS | Status: AC | PRN
  Administered 2012-06-10: 500 [IU]
  Filled 2012-06-10: qty 5

## 2012-06-10 MED ORDER — SODIUM CHLORIDE 0.9 % IV SOLN
15.0000 mg/kg | Freq: Once | INTRAVENOUS | Status: AC
  Administered 2012-06-10: 800 mg via INTRAVENOUS
  Filled 2012-06-10: qty 32

## 2012-06-10 MED ORDER — SODIUM CHLORIDE 0.9 % IV SOLN
Freq: Once | INTRAVENOUS | Status: DC
Start: 2012-06-10 — End: 2012-06-10

## 2012-06-10 NOTE — Telephone Encounter (Signed)
appts made and printed for pt aom °

## 2012-06-10 NOTE — Progress Notes (Signed)
[facility] [phone]   Fax:([phone]  OFFICE PROGRESS NOTE  [name],[name], [name]  DIAGNOSIS:  #1 Recurrent non-small cell lung cancer consistent with adenocarcinoma. This was initially diagnosed as stage IB (T2a N0 M0) well-differentiated adenocarcinoma of bronchioalveolar type in December 2010.  #2 Acute pulmonary embolism in the right lower lobe lobar, segmental and subsegmental sized pulmonary arteries, diagnosed on 11/04/2011.   PRIOR THERAPY:  1.Status post right upper lobectomy on September 28, 2009 under the care of Dr. Edwyna Shell. The patient refused adjuvant chemotherapy at that time. She had disease recurrence in June of 2012.  2.Status post 4 cycles of systemic chemotherapy with carboplatin for an AUC of 6 and paclitaxel at 200 mg per meter squared and Avastin at 15 mg/kg according to the ECOG protocol #5508.   CURRENT THERAPY: Chemotherapy with Avastin 15 mg/kg according to the ECOG protocol #5508 status post 14 cycles.    INTERVAL HISTORY: [name] 71 y.o. female returns to the clinic today for followup visit. The patient is feeling fine today with no specific complaints except for further weight loss. She continues to have peripheral neuropathy and she is currently off Lyrica. She did not feel much difference whether she was taking the medication or not. The patient denied having any significant chest pain, shortness breath, cough or hemoptysis. She denied having any significant nausea or vomiting. She denied having any bleeding issues. She has repeat CT scan of the chest performed recently and she is here for evaluation and discussion of her scan results.  MEDICAL HISTORY: Past Medical History  Diagnosis Date  . Lung cancer 03/08/2011    recurrent  . Hypertension   . Hypothyroidism   . Hypercholesterolemia   . Glaucoma   . Hip pain 09/02/2011  . Foot pain 09/02/2011    ALLERGIES:  is allergic to  amitriptyline; codeine; and sulfa antibiotics.  MEDICATIONS:  Current Outpatient Prescriptions  Medication Sig Dispense Refill  . amLODipine (NORVASC) 5 MG tablet Take 10 mg by mouth daily. Per [name], NP      . citalopram (CELEXA) 40 MG tablet Take 40 mg by mouth daily.        . clonazePAM (KLONOPIN) 0.5 MG tablet Take 0.5 mg by mouth 2 (two) times daily as needed. Per [name], NP      . cyanocobalamin (,VITAMIN B-12,) 1000 MCG/ML injection Inject 1,000 mcg into the muscle every 30 (thirty) days.      . dorzolamide-timolol (COSOPT) 22.3-6.8 MG/ML ophthalmic solution 2 drops 2 (two) times daily. Per [name], NP       . furosemide (LASIX) 20 MG tablet Take 1 tablet by mouth Daily.       Marland Kitchen levothyroxine (SYNTHROID, LEVOTHROID) 50 MCG tablet Take 50 mcg by mouth 4 (four) times a week. Per [name], NP       . levothyroxine (SYNTHROID, LEVOTHROID) 75 MCG tablet Take 75 mcg by mouth 3 (three) times a week. Per [name], NP       . lidocaine-prilocaine (EMLA) cream Apply 1 application topically as needed.        Marland Kitchen lisinopril (PRINIVIL,ZESTRIL) 40 MG tablet Take 40 mg by mouth daily. Per [name], NP       . potassium chloride SA (K-DUR,KLOR-CON) 20 MEQ tablet Take 1 tablet (20 mEq total) by mouth 2 (two) times daily.  14 tablet  0  . prochlorperazine (COMPAZINE) 10 MG tablet Take 10 mg  by mouth every 6 (six) hours as needed.        . rosuvastatin (CRESTOR) 10 MG tablet Take 10 mg by mouth daily.          SURGICAL HISTORY:  Past Surgical History  Procedure Date  . Video assisted thoracoscopy 09/28/2009  . Thoracotomy 09/28/2009    mini  . Lung lobectomy 09/28/2009    right upper    REVIEW OF SYSTEMS:  A comprehensive review of systems was negative except for: Constitutional: positive for fatigue and weight loss Neurological: positive for paresthesia   PHYSICAL EXAMINATION: General appearance: alert, cooperative and no distress Head: Normocephalic, without  obvious abnormality, atraumatic Neck: no adenopathy Lymph nodes: Cervical, supraclavicular, and axillary nodes normal. Resp: clear to auscultation bilaterally Cardio: regular rate and rhythm, S1, S2 normal, no murmur, click, rub or gallop GI: soft, non-tender; bowel sounds normal; no masses,  no organomegaly Extremities: extremities normal, atraumatic, no cyanosis or edema Neurologic: Alert and oriented X 3, normal strength and tone. Normal symmetric reflexes. Normal coordination and gait  ECOG PERFORMANCE STATUS: 1 - Symptomatic but completely ambulatory  There were no vitals taken for this visit.  LABORATORY DATA: Lab Results  Component Value Date   WBC 5.8 06/10/2012   HGB 13.5 06/10/2012   HCT 40.7 06/10/2012   MCV 88.2 06/10/2012   PLT 130* 06/10/2012      Chemistry      Component Value Date/Time   NA 136 05/20/2012 1416   NA 142 03/08/2011 0754   K 4.4 05/20/2012 1416   K 4.2 03/08/2011 0754   CL 99 05/20/2012 1416   CL 102 03/08/2011 0754   CO2 26 05/20/2012 1416   CO2 27 03/08/2011 0754   BUN 6 05/20/2012 1416   BUN 9 03/08/2011 0754   CREATININE 1.11* 05/20/2012 1416   CREATININE 1.0 03/08/2011 0754      Component Value Date/Time   CALCIUM 10.3 05/20/2012 1416   CALCIUM 9.5 03/08/2011 0754   ALKPHOS 71 05/20/2012 1416   ALKPHOS 59 03/08/2011 0754   AST 9 05/20/2012 1416   AST 19 03/08/2011 0754   ALT <5 05/20/2012 1416   BILITOT 0.7 05/20/2012 1416   BILITOT 0.50 03/08/2011 0754       RADIOGRAPHIC STUDIES: Ct Chest W Contrast  06/04/2012  *RADIOLOGY REPORT*  Clinical Data: Restaging of lung cancer.  Diagnosed 2010.  Thyroid cancer 2007.  Ongoing chemotherapy.  CT CHEST WITH CONTRAST  Technique:  Multidetector CT imaging of the chest was performed following the standard protocol during bolus administration of intravenous contrast.  Contrast: 80mL OMNIPAQUE IOHEXOL 300 MG/ML  SOLN  Comparison: 04/20/2012  Findings:  Recist protocol 1.1 target lesions:  1.  The peripheral right lower lobe  ground-glass opacity of 1.5 cm on image 42. Similar 1.6 cm on the prior. 2.  Dominant cystic lesion with peripheral nodular components in the left lower lobe.  4.3 cm, image 25, stable.   Lung windows demonstrate moderate centrilobular emphysema. Similar surgical changes in the right upper lung without evidence of locally recurrent disease. Minimal nodularity anterior right lung on image 25 which is unchanged. Ground-glass right lower lobe focus measures 1.5 cm on image 42 and is unchanged since 04/30/2012.  Scattered ground-glass opacities in the right lower lobe which are unchanged, including medially on image 33.  Similar configuration of scattered interstitial opacities on the left.  An area of architectural distortion and ground-glass opacity within the posterior left apex appears similar.  Example image 11.  Lingular calcified granuloma with adjacent mild nodularity. Central left lower lobe and high superior segment left lower lobe architectural distortion. Cavitary lesion with solid components in the superior segment left lower lobe component.  This measures 4.3 x 4.3 cm today on image 25 and is unchanged.  Soft tissue windows demonstrate no supraclavicular adenopathy. Right-sided Port-A-Cath which terminates at the high right atrium. Descending thoracic aortic atherosclerosis.  Mild cardiomegaly with multivessel coronary artery atherosclerosis. No pericardial or pleural effusion.  No central pulmonary embolism, on this non- dedicated study.  No mediastinal or hilar adenopathy.  There are calcified left hilar nodes.  Limited abdominal imaging demonstrates similar hepatic dome too small to characterize lesion which is likely a cyst.  Old granulomatous disease in the liver and spleen. Normal adrenal glands.  No acute osseous abnormality.  IMPRESSION:  1.  Similar appearance of the chest with nodules, areas of architectural distortion, and a cystic lesion within the left lower lobe. 2.  No evidence of thoracic  adenopathy or metastatic disease.   Original Report Authenticated By: Consuello Bossier, M.D.     ASSESSMENT: This is a very pleasant 71 years old [place] American female with metastatic non-small cell lung cancer currently on maintenance therapy with Avastin 15 mg/kg every 3 weeks. The patient is tolerating her treatment fairly well with no significant adverse effects except for worsening weight loss which may not be attributed to her current treatment. She has no evidence for disease progression on his recent CT scan of the chest.  PLAN: I discussed the scan results with the patient. I recommended for her to continue on her current maintenance therapy with Avastin as scheduled but we will adjust her dose according to her weight. For the weight loss she would be referred to the dietitian at the cancer Center. For hypokalemia, the patient will start today on potassium supplements. She would come back for followup visit in 3 weeks with the next cycle of her therapy. She was advised to call immediately if she has any concerning symptoms in the interval.  All questions were answered. The patient knows to call the clinic with any problems, questions or concerns. We can certainly see the patient much sooner if necessary.  I spent 15 minutes counseling the patient face to face. The total time spent in the appointment was 25 minutes.

## 2012-06-10 NOTE — Patient Instructions (Addendum)
Your CT scan of the chest showed stable disease. Continue his maintenance treatment with Avastin. Followup in 3 weeks with the next cycle of Avastin. Increase nutritional supplement as prescribed by the dietitian.

## 2012-06-10 NOTE — Progress Notes (Signed)
Quick Note:  Call patient with the result and start K Dur 20 meq po qd x 7 ______

## 2012-06-10 NOTE — Progress Notes (Signed)
Patient in to clinic today for evaluation following CT scans and prior to next scheduled treatment, maintenance cycle #15. Per Dr. Arbutus Ped, CT results reveal continued stable disease, and lung lesion described as cavitary is small and therefore does not represent an area for concern in regards to bevacizumab administration. Based on history and physical and lab results review by Dr. Arbutus Ped, patient condition is acceptable for treatment today.  Patient met with dietitian today and has received a new supplement to try; based on weight loss of >10% since baseline (Step 2), bevacizumab doses will be recalculated. Pharmacist Foye Clock Hazard notified of change. Sign for infusion given to infusion nurse Adella Hare. Notified Dr. Asa Lente nurse, Vincent Peyer, for follow-up regarding hypokalemia, as patient states she is no longer taking potassium supplements and has about 4 tablets remaining at home.

## 2012-06-11 ENCOUNTER — Telehealth: Payer: Self-pay | Admitting: Medical Oncology

## 2012-06-11 DIAGNOSIS — E876 Hypokalemia: Secondary | ICD-10-CM

## 2012-06-11 MED ORDER — POTASSIUM CHLORIDE CRYS ER 20 MEQ PO TBCR: 20.0000 meq | EXTENDED_RELEASE_TABLET | Freq: Every day | ORAL | Status: DC

## 2012-06-11 NOTE — Telephone Encounter (Signed)
Message copied by Charma Igo on Thu Jun 11, 2012  3:15 PM ------      Message from: Si Gaul      Created: Wed Jun 10, 2012 10:53 PM       Call patient with the result and start K Dur 20 meq po qd x 7

## 2012-06-11 NOTE — Assessment & Plan Note (Signed)
MEDICAL DIAGNOSIS:  [name] is a 71 year old female patient of Dr. Arbutus Ped,  with recurrent non-small cell lung cancer receiving chemotherapy.  She also has an acute PE.  MEDICAL HISTORY INCLUDES:  Hypertension, hypothyroidism and hypercholesterolemia.  MEDICATIONS INCLUDE:  Celexa, Klonopin, Lasix, Synthroid, K-Dur,Compazine, and Crestor.  LABS:  Potassium 3.1 on August 28th.  HEIGHT:  5 feet 4 inches. WEIGHT:  120.6 pounds on August 7th. USUAL BODY WEIGHT:  132 pounds June 2013. BMI:  20.69  PATIENT STATES:  "I am so weak."  She reports poor appetite for the last 2 months.  She eats a little fruit and some pudding.  She does her own shopping and her own cooking. Therefore this makes it very difficult for her to increase her calories.  NUTRITION DIAGNOSIS:  Unintended weight loss related to recurrent lung cancer and associated treatments as evidenced by 9% weight loss over the last 2 months.  INTERVENTION:  I have educated Ms. Paschal on easy, ready-to-eat meals and snacks that she can consume throughout the day as well as encouraging her to drink vanilla Ensure t.i.d. between meals.  The patient enjoys this product and has no trouble consuming.  I have provided her with a specific list of foods she should try to incorporate into her daily food intake to help promote weight gain.  I provided fact sheets.  I have given her a sample case of Ensure Plus to take with her today as well as my contact information and I have answered her questions.  MONITORING/EVALUATION (GOALS):  The patient will tolerate increased oral intake to minimize further weight loss.  NEXT VISIT:  RD will work with the patient as needed.    ______________________________ Zenovia Jarred, RD, CSO, LDN Clinical Nutrition Specialist BN/MEDQ  D:  06/10/2012  T:  06/11/2012  Job:  [id number]

## 2012-06-11 NOTE — Telephone Encounter (Signed)
Called K+ to pharmacy  And pt notified.

## 2012-07-01 ENCOUNTER — Ambulatory Visit (HOSPITAL_BASED_OUTPATIENT_CLINIC_OR_DEPARTMENT_OTHER): Payer: Medicare Other | Admitting: Internal Medicine

## 2012-07-01 ENCOUNTER — Encounter: Payer: Medicare Other | Admitting: *Deleted

## 2012-07-01 ENCOUNTER — Telehealth: Payer: Self-pay | Admitting: Internal Medicine

## 2012-07-01 ENCOUNTER — Other Ambulatory Visit (HOSPITAL_BASED_OUTPATIENT_CLINIC_OR_DEPARTMENT_OTHER): Payer: Medicare Other | Admitting: Lab

## 2012-07-01 ENCOUNTER — Ambulatory Visit (HOSPITAL_BASED_OUTPATIENT_CLINIC_OR_DEPARTMENT_OTHER): Payer: Medicare Other

## 2012-07-01 VITALS — BP 121/62 | HR 85 | Temp 97.5°F | Resp 18 | Ht 64.0 in | Wt 115.3 lb

## 2012-07-01 DIAGNOSIS — C3490 Malignant neoplasm of unspecified part of unspecified bronchus or lung: Secondary | ICD-10-CM

## 2012-07-01 DIAGNOSIS — R634 Abnormal weight loss: Secondary | ICD-10-CM

## 2012-07-01 DIAGNOSIS — E538 Deficiency of other specified B group vitamins: Secondary | ICD-10-CM

## 2012-07-01 DIAGNOSIS — C3410 Malignant neoplasm of upper lobe, unspecified bronchus or lung: Secondary | ICD-10-CM

## 2012-07-01 DIAGNOSIS — G609 Hereditary and idiopathic neuropathy, unspecified: Secondary | ICD-10-CM

## 2012-07-01 DIAGNOSIS — Z5112 Encounter for antineoplastic immunotherapy: Secondary | ICD-10-CM

## 2012-07-01 DIAGNOSIS — R630 Anorexia: Secondary | ICD-10-CM

## 2012-07-01 LAB — COMPREHENSIVE METABOLIC PANEL (CC13)
ALT: 8 U/L (ref 0–55)
AST: 11 U/L (ref 5–34)
Alkaline Phosphatase: 70 U/L (ref 40–150)
Creatinine: 1.2 mg/dL — ABNORMAL HIGH (ref 0.6–1.1)
Total Bilirubin: 0.5 mg/dL (ref 0.20–1.20)

## 2012-07-01 LAB — CBC WITH DIFFERENTIAL/PLATELET
BASO%: 0.6 % (ref 0.0–2.0)
EOS%: 1.4 % (ref 0.0–7.0)
LYMPH%: 25.7 % (ref 14.0–49.7)
MCHC: 33.3 g/dL (ref 31.5–36.0)
MCV: 89.2 fL (ref 79.5–101.0)
MONO%: 7.1 % (ref 0.0–14.0)
Platelets: 142 10*3/uL — ABNORMAL LOW (ref 145–400)
RBC: 4.64 10*6/uL (ref 3.70–5.45)
RDW: 16.2 % — ABNORMAL HIGH (ref 11.2–14.5)
WBC: 6.8 10*3/uL (ref 3.9–10.3)

## 2012-07-01 LAB — MAGNESIUM (CC13): Magnesium: 2.5 mg/dl (ref 1.5–2.5)

## 2012-07-01 LAB — UA PROTEIN, DIPSTICK - CHCC: Protein, ur: <30 mg/dL

## 2012-07-01 MED ORDER — SODIUM CHLORIDE 0.9 % IV SOLN
Freq: Once | INTRAVENOUS | Status: AC
  Administered 2012-07-01: 14:00:00 via INTRAVENOUS

## 2012-07-01 MED ORDER — CYANOCOBALAMIN 1000 MCG/ML IJ SOLN
1000.0000 ug | Freq: Once | INTRAMUSCULAR | Status: AC
  Administered 2012-07-01: 1000 ug via INTRAMUSCULAR

## 2012-07-01 MED ORDER — SODIUM CHLORIDE 0.9 % IV SOLN
Freq: Once | INTRAVENOUS | Status: AC
  Administered 2012-07-01: 12:00:00 via INTRAVENOUS

## 2012-07-01 MED ORDER — SODIUM CHLORIDE 0.9 % IV SOLN
15.0000 mg/kg | Freq: Once | INTRAVENOUS | Status: AC
  Administered 2012-07-01: 800 mg via INTRAVENOUS
  Filled 2012-07-01: qty 32

## 2012-07-01 MED ORDER — SODIUM CHLORIDE 0.9 % IJ SOLN
10.0000 mL | INTRAMUSCULAR | Status: DC | PRN
  Administered 2012-07-01: 10 mL
  Filled 2012-07-01: qty 10

## 2012-07-01 MED ORDER — HEPARIN SOD (PORK) LOCK FLUSH 100 UNIT/ML IV SOLN
500.0000 [IU] | Freq: Once | INTRAVENOUS | Status: AC | PRN
  Administered 2012-07-01: 500 [IU]
  Filled 2012-07-01: qty 5

## 2012-07-01 NOTE — Patient Instructions (Signed)
[facility] Discharge Instructions for Patients Receiving Chemotherapy  Today you received the following chemotherapy agents Avastin To help prevent nausea and vomiting after your treatment, we encourage you to take your nausea medication as prescribed.  If you develop nausea and vomiting that is not controlled by your nausea medication, call the clinic. If it is after clinic hours your family physician or the after hours number for the clinic or go to the Emergency Department.   BELOW ARE SYMPTOMS THAT SHOULD BE REPORTED IMMEDIATELY:  *FEVER GREATER THAN 100.5 F  *CHILLS WITH OR WITHOUT FEVER  NAUSEA AND VOMITING THAT IS NOT CONTROLLED WITH YOUR NAUSEA MEDICATION  *UNUSUAL SHORTNESS OF BREATH  *UNUSUAL BRUISING OR BLEEDING  TENDERNESS IN MOUTH AND THROAT WITH OR WITHOUT PRESENCE OF ULCERS  *URINARY PROBLEMS  *BOWEL PROBLEMS  UNUSUAL RASH Items with * indicate a potential emergency and should be followed up as soon as possible.  One of the nurses will contact you 24 hours after your treatment. Please let the nurse know about any problems that you may have experienced. Feel free to call the clinic you have any questions or concerns. The clinic phone number is [phone].   I have been informed and understand all the instructions given to me. I know to contact the clinic, my physician, or go to the Emergency Department if any problems should occur. I do not have any questions at this time, but understand that I may call the clinic during office hours   should I have any questions or need assistance in obtaining follow up care.    __________________________________________  _____________  __________ Signature of Patient or Authorized Representative            Date                   Time    __________________________________________ Nurse's Signature    

## 2012-07-01 NOTE — Progress Notes (Signed)
[facility] [phone]   [phone]  OFFICE PROGRESS NOTE  [name],[name], [name]  DIAGNOSIS:  #1 Recurrent non-small cell lung cancer consistent with adenocarcinoma. This was initially diagnosed as stage IB (T2a N0 M0) well-differentiated adenocarcinoma of bronchioalveolar type in December 2010.  #2 Acute pulmonary embolism in the right lower lobe lobar, segmental and subsegmental sized pulmonary arteries, diagnosed on 11/04/2011.   PRIOR THERAPY:  1.Status post right upper lobectomy on September 28, 2009 under the care of Dr. Edwyna Shell. The patient refused adjuvant chemotherapy at that time. She had disease recurrence in June of 2012.  2.Status post 4 cycles of systemic chemotherapy with carboplatin for an AUC of 6 and paclitaxel at 200 mg per meter squared and Avastin at 15 mg/kg according to the ECOG protocol #5508.   CURRENT THERAPY: Chemotherapy with Avastin 15 mg/kg according to the ECOG protocol #5508 status post 15 cycles.    INTERVAL HISTORY: [name] 71 y.o. female returns to the clinic today for followup visit. 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 She was seen by the dietitian at the cancer Center and was taken 3 bottles of Ensure at daily basis. She lost 1 pound since her last visit. The patient denied having any significant chest pain, shortness breath, cough or hemoptysis. She is here today to start cycle #16 of her maintenance therapy with Avastin.  MEDICAL HISTORY: Past Medical History  Diagnosis Date  . Lung cancer 03/08/2011    recurrent  . Hypertension   . Hypothyroidism   . Hypercholesterolemia   . Glaucoma   . Hip pain 09/02/2011  . Foot pain 09/02/2011    ALLERGIES:  is allergic to amitriptyline; codeine; and sulfa antibiotics.  MEDICATIONS:  Current Outpatient Prescriptions  Medication Sig Dispense  Refill  . amLODipine (NORVASC) 5 MG tablet Take 10 mg by mouth daily. Per [name], NP      . citalopram (CELEXA) 40 MG tablet Take 40 mg by mouth daily.        . clonazePAM (KLONOPIN) 0.5 MG tablet Take 0.5 mg by mouth 2 (two) times daily as needed. Per [name], NP      . cyanocobalamin (,VITAMIN B-12,) 1000 MCG/ML injection Inject 1,000 mcg into the muscle every 30 (thirty) days.      . dorzolamide-timolol (COSOPT) 22.3-6.8 MG/ML ophthalmic solution 2 drops 2 (two) times daily. Per [name], NP       . levothyroxine (SYNTHROID, LEVOTHROID) 50 MCG tablet Take 50 mcg by mouth 4 (four) times a week. Per [name], NP       . levothyroxine (SYNTHROID, LEVOTHROID) 75 MCG tablet Take 75 mcg by mouth 3 (three) times a week. Per [name], NP       . lidocaine-prilocaine (EMLA) cream Apply 1 application topically as needed.        Marland Kitchen lisinopril (PRINIVIL,ZESTRIL) 40 MG tablet Take 40 mg by mouth daily. Per [name], NP       . potassium chloride SA (K-DUR,KLOR-CON) 20 MEQ tablet Take 1 tablet (20 mEq total) by mouth daily.  7 tablet  0  . prochlorperazine (COMPAZINE) 10 MG tablet Take 10 mg by mouth every 6 (six) hours as needed.        . rosuvastatin (CRESTOR) 10 MG tablet Take 10 mg by mouth daily.  SURGICAL HISTORY:  Past Surgical History  Procedure Date  . Video assisted thoracoscopy 09/28/2009  . Thoracotomy 09/28/2009    mini  . Lung lobectomy 09/28/2009    right upper    REVIEW OF SYSTEMS:  A comprehensive review of systems was negative except for: Constitutional: positive for anorexia, fatigue and weight loss Neurological: positive for paresthesia   PHYSICAL EXAMINATION: General appearance: alert, cooperative and no distress Head: Normocephalic, without obvious abnormality, atraumatic Neck: no adenopathy Lymph nodes: Cervical, supraclavicular, and axillary nodes normal. Resp: clear to auscultation bilaterally Cardio: regular rate and rhythm, S1,  S2 normal, no murmur, click, rub or gallop GI: soft, non-tender; bowel sounds normal; no masses,  no organomegaly Extremities: extremities normal, atraumatic, no cyanosis or edema Neurologic: Alert and oriented X 3, normal strength and tone. Normal symmetric reflexes. Normal coordination and gait  ECOG PERFORMANCE STATUS: 1 - Symptomatic but completely ambulatory  Blood pressure 121/62, pulse 85, temperature 97.5 F (36.4 C), resp. rate 18, height 5\' 4"  (1.626 m), weight 115 lb 4.8 oz (52.3 kg).  LABORATORY DATA: Lab Results  Component Value Date   WBC 6.8 07/01/2012   HGB 13.8 07/01/2012   HCT 41.4 07/01/2012   MCV 89.2 07/01/2012   PLT 142* 07/01/2012      Chemistry      Component Value Date/Time   NA 137 06/10/2012 1149   NA 136 05/20/2012 1416   NA 142 03/08/2011 0754   K 3.1* 06/10/2012 1149   K 4.4 05/20/2012 1416   K 4.2 03/08/2011 0754   CL 103 06/10/2012 1149   CL 99 05/20/2012 1416   CL 102 03/08/2011 0754   CO2 26 06/10/2012 1149   CO2 26 05/20/2012 1416   CO2 27 03/08/2011 0754   BUN 10.0 06/10/2012 1149   BUN 6 05/20/2012 1416   BUN 9 03/08/2011 0754   CREATININE 1.1 06/10/2012 1149   CREATININE 1.11* 05/20/2012 1416   CREATININE 1.0 03/08/2011 0754      Component Value Date/Time   CALCIUM 9.3 06/10/2012 1149   CALCIUM 10.3 05/20/2012 1416   CALCIUM 9.5 03/08/2011 0754   ALKPHOS 65 06/10/2012 1149   ALKPHOS 71 05/20/2012 1416   ALKPHOS 59 03/08/2011 0754   AST 9 06/10/2012 1149   AST 9 05/20/2012 1416   AST 19 03/08/2011 0754   ALT <6 Repeated and Verified 06/10/2012 1149   ALT <5 05/20/2012 1416   BILITOT 0.70 06/10/2012 1149   BILITOT 0.7 05/20/2012 1416   BILITOT 0.50 03/08/2011 0754       RADIOGRAPHIC STUDIES: Ct Chest W Contrast  06/04/2012  *RADIOLOGY REPORT*  Clinical Data: Restaging of lung cancer.  Diagnosed 2010.  Thyroid cancer 2007.  Ongoing chemotherapy.  CT CHEST WITH CONTRAST  Technique:  Multidetector CT imaging of the chest was performed following the standard protocol  during bolus administration of intravenous contrast.  Contrast: 80mL OMNIPAQUE IOHEXOL 300 MG/ML  SOLN  Comparison: 04/20/2012  Findings:  Recist protocol 1.1 target lesions:  1.  The peripheral right lower lobe ground-glass opacity of 1.5 cm on image 42. Similar 1.6 cm on the prior. 2.  Dominant cystic lesion with peripheral nodular components in the left lower lobe.  4.3 cm, image 25, stable.   Lung windows demonstrate moderate centrilobular emphysema. Similar surgical changes in the right upper lung without evidence of locally recurrent disease. Minimal nodularity anterior right lung on image 25 which is unchanged. Ground-glass right lower lobe focus measures 1.5 cm on image 42 and  is unchanged since 04/30/2012.  Scattered ground-glass opacities in the right lower lobe which are unchanged, including medially on image 33.  Similar configuration of scattered interstitial opacities on the left.  An area of architectural distortion and ground-glass opacity within the posterior left apex appears similar.  Example image 11.  Lingular calcified granuloma with adjacent mild nodularity. Central left lower lobe and high superior segment left lower lobe architectural distortion. Cavitary lesion with solid components in the superior segment left lower lobe component.  This measures 4.3 x 4.3 cm today on image 25 and is unchanged.  Soft tissue windows demonstrate no supraclavicular adenopathy. Right-sided Port-A-Cath which terminates at the high right atrium. Descending thoracic aortic atherosclerosis.  Mild cardiomegaly with multivessel coronary artery atherosclerosis. No pericardial or pleural effusion.  No central pulmonary embolism, on this non- dedicated study.  No mediastinal or hilar adenopathy.  There are calcified left hilar nodes.  Limited abdominal imaging demonstrates similar hepatic dome too small to characterize lesion which is likely a cyst.  Old granulomatous disease in the liver and spleen. Normal adrenal  glands.  No acute osseous abnormality.  IMPRESSION:  1.  Similar appearance of the chest with nodules, areas of architectural distortion, and a cystic lesion within the left lower lobe. 2.  No evidence of thoracic adenopathy or metastatic disease.   Original Report Authenticated By: Consuello Bossier, M.D.     ASSESSMENT: This is a very pleasant 71 years old [place] American female with metastatic non-small cell lung cancer currently on maintenance treatment with Avastin 15 mg/KG according to the ECoG protocol 5508.  PLAN: The patient is tolerating her treatment fairly well except for the peripheral neuropathy from the previous induction chemotherapy as well as the lack of appetite and weight loss. I recommended for her to proceed with her maintenance therapy with Avastin as scheduled today. Regarding the weight loss I recommended for the patient to start Medrol Dosepak and she was encouraged to keep taking her nutritional supplements at regular basis. She would come back for followup visit in 3 weeks with repeat CT scan of the chest for restaging of her disease. She was advised to call me immediately if she has any concerning symptoms in the interval.  All questions were answered. The patient knows to call the clinic with any problems, questions or concerns. We can certainly see the patient much sooner if necessary.  I spent 15 minutes counseling the patient face to face. The total time spent in the appointment was 25 minutes.

## 2012-07-01 NOTE — Progress Notes (Signed)
Patient in to clinic today for evaluation prior to receiving maintenance cycle 16 treatment. Patient's primary complaint today is weight loss. Dr. Arbutus Ped plans a trial of steroids to see if this will improve her appetite. Based on lab results review and history and physical by Dr. Arbutus Ped today, patient condition is acceptable for continued treatment.  Potassium level is within normal range today; clarified that patient continued to take potassium supplements after the seven days previously prescribed, because she still had some remaining tablets from an earlier prescription. Per Dr. Arbutus Ped, patient should continue taking the few remaining tablets (about 4 according to patient) and will re-check potassium level at the next visit. Patient voiced understanding of this plan.  Per Dr. Asa Lente request, study chair will be contacted regarding the possibility of extending CT scans to 9 week intervals.  Sign for infusion given to Nelly Laurence RN.

## 2012-07-01 NOTE — Telephone Encounter (Signed)
appts made,vm to cindy in [facility] as the time asked for  Is not avail for the md visit,pt aware

## 2012-07-01 NOTE — Patient Instructions (Signed)
Your labwork is good today to proceed with chemotherapy. I will start you on Medrol Dosepak to help with your lack of appetite. Followup in 3 weeks with repeat CT scan of the chest.

## 2012-07-03 ENCOUNTER — Other Ambulatory Visit: Payer: Self-pay | Admitting: Certified Registered Nurse Anesthetist

## 2012-07-06 ENCOUNTER — Telehealth: Payer: Self-pay | Admitting: Medical Oncology

## 2012-07-06 DIAGNOSIS — R630 Anorexia: Secondary | ICD-10-CM

## 2012-07-06 MED ORDER — METHYLPREDNISOLONE 4 MG PO KIT: PACK | ORAL | Status: DC

## 2012-07-06 NOTE — Telephone Encounter (Signed)
Called in medrol dose pak and to pt

## 2012-07-16 ENCOUNTER — Other Ambulatory Visit: Payer: Self-pay | Admitting: *Deleted

## 2012-07-17 ENCOUNTER — Ambulatory Visit (HOSPITAL_COMMUNITY)
Admission: RE | Admit: 2012-07-17 | Discharge: 2012-07-17 | Disposition: A | Discharge status: Home / Self Care | Payer: Medicare Other | Source: Ambulatory Visit | Attending: Internal Medicine | Admitting: Internal Medicine

## 2012-07-17 DIAGNOSIS — J984 Other disorders of lung: Secondary | ICD-10-CM | POA: Insufficient documentation

## 2012-07-17 DIAGNOSIS — C3490 Malignant neoplasm of unspecified part of unspecified bronchus or lung: Secondary | ICD-10-CM | POA: Insufficient documentation

## 2012-07-17 DIAGNOSIS — K7689 Other specified diseases of liver: Secondary | ICD-10-CM | POA: Insufficient documentation

## 2012-07-17 MED ORDER — IOHEXOL 300 MG/ML  SOLN
80.0000 mL | Freq: Once | INTRAMUSCULAR | Status: AC | PRN
  Administered 2012-07-17: 80 mL via INTRAVENOUS

## 2012-07-22 ENCOUNTER — Ambulatory Visit (HOSPITAL_BASED_OUTPATIENT_CLINIC_OR_DEPARTMENT_OTHER): Payer: Medicare Other | Admitting: Internal Medicine

## 2012-07-22 ENCOUNTER — Encounter: Payer: Medicare Other | Admitting: *Deleted

## 2012-07-22 ENCOUNTER — Telehealth: Payer: Self-pay | Admitting: Internal Medicine

## 2012-07-22 ENCOUNTER — Other Ambulatory Visit (HOSPITAL_BASED_OUTPATIENT_CLINIC_OR_DEPARTMENT_OTHER): Payer: Medicare Other

## 2012-07-22 ENCOUNTER — Ambulatory Visit (HOSPITAL_BASED_OUTPATIENT_CLINIC_OR_DEPARTMENT_OTHER): Payer: Medicare Other

## 2012-07-22 VITALS — BP 129/75 | HR 79 | Temp 97.1°F | Resp 18 | Ht 64.0 in | Wt 116.6 lb

## 2012-07-22 DIAGNOSIS — C3410 Malignant neoplasm of upper lobe, unspecified bronchus or lung: Secondary | ICD-10-CM

## 2012-07-22 DIAGNOSIS — G629 Polyneuropathy, unspecified: Secondary | ICD-10-CM

## 2012-07-22 DIAGNOSIS — G609 Hereditary and idiopathic neuropathy, unspecified: Secondary | ICD-10-CM

## 2012-07-22 DIAGNOSIS — C3490 Malignant neoplasm of unspecified part of unspecified bronchus or lung: Secondary | ICD-10-CM

## 2012-07-22 DIAGNOSIS — E538 Deficiency of other specified B group vitamins: Secondary | ICD-10-CM

## 2012-07-22 DIAGNOSIS — Z86711 Personal history of pulmonary embolism: Secondary | ICD-10-CM

## 2012-07-22 DIAGNOSIS — Z5112 Encounter for antineoplastic immunotherapy: Secondary | ICD-10-CM

## 2012-07-22 LAB — UA PROTEIN, DIPSTICK - CHCC: Protein, ur: <30 mg/dL

## 2012-07-22 LAB — COMPREHENSIVE METABOLIC PANEL (CC13)
BUN: 13 mg/dL (ref 7.0–26.0)
CO2: 24 mEq/L (ref 22–29)
Calcium: 10.1 mg/dL (ref 8.4–10.4)
Chloride: 104 mEq/L (ref 98–107)
Creatinine: 1 mg/dL (ref 0.6–1.1)

## 2012-07-22 LAB — CBC WITH DIFFERENTIAL/PLATELET
Basophils Absolute: 0.1 10*3/uL (ref 0.0–0.1)
Eosinophils Absolute: 0.1 10*3/uL (ref 0.0–0.5)
HGB: 14 g/dL (ref 11.6–15.9)
LYMPH%: 19.5 % (ref 14.0–49.7)
MCV: 90.5 fL (ref 79.5–101.0)
MONO%: 8 % (ref 0.0–14.0)
NEUT#: 7 10*3/uL — ABNORMAL HIGH (ref 1.5–6.5)
NEUT%: 70.8 % (ref 38.4–76.8)
Platelets: 141 10*3/uL — ABNORMAL LOW (ref 145–400)

## 2012-07-22 MED ORDER — SODIUM CHLORIDE 0.9 % IV SOLN
Freq: Once | INTRAVENOUS | Status: AC
  Administered 2012-07-22: 15:00:00 via INTRAVENOUS

## 2012-07-22 MED ORDER — CYANOCOBALAMIN 1000 MCG/ML IJ SOLN
1000.0000 ug | Freq: Once | INTRAMUSCULAR | Status: AC
  Administered 2012-07-22: 1000 ug via INTRAMUSCULAR

## 2012-07-22 MED ORDER — HEPARIN SOD (PORK) LOCK FLUSH 100 UNIT/ML IV SOLN
500.0000 [IU] | Freq: Once | INTRAVENOUS | Status: AC | PRN
  Administered 2012-07-22: 500 [IU]
  Filled 2012-07-22: qty 5

## 2012-07-22 MED ORDER — SODIUM CHLORIDE 0.9 % IV SOLN
Freq: Once | INTRAVENOUS | Status: AC
  Administered 2012-07-22: 14:00:00 via INTRAVENOUS

## 2012-07-22 MED ORDER — SODIUM CHLORIDE 0.9 % IJ SOLN
10.0000 mL | INTRAMUSCULAR | Status: DC | PRN
  Administered 2012-07-22: 10 mL
  Filled 2012-07-22: qty 10

## 2012-07-22 MED ORDER — SODIUM CHLORIDE 0.9 % IV SOLN
15.0000 mg/kg | Freq: Once | INTRAVENOUS | Status: AC
  Administered 2012-07-22: 800 mg via INTRAVENOUS
  Filled 2012-07-22: qty 32

## 2012-07-22 NOTE — Progress Notes (Signed)
Pt requested B12 shot today rather than be 2 weeks late (next appt 10/30).

## 2012-07-22 NOTE — Patient Instructions (Addendum)
[facility] Discharge Instructions for Patients Receiving Chemotherapy  Today you received the following chemotherapy agents: chcc  To help prevent nausea and vomiting after your treatment, we encourage you to take your nausea medication.  Take it as often as prescribed.    If you develop nausea and vomiting that is not controlled by your nausea medication, call the clinic. If it is after clinic hours your family physician or the after hours number for the clinic or go to the Emergency Department.   BELOW ARE SYMPTOMS THAT SHOULD BE REPORTED IMMEDIATELY:  *FEVER GREATER THAN 100.5 F  *CHILLS WITH OR WITHOUT FEVER  NAUSEA AND VOMITING THAT IS NOT CONTROLLED WITH YOUR NAUSEA MEDICATION  *UNUSUAL SHORTNESS OF BREATH  *UNUSUAL BRUISING OR BLEEDING  TENDERNESS IN MOUTH AND THROAT WITH OR WITHOUT PRESENCE OF ULCERS  *URINARY PROBLEMS  *BOWEL PROBLEMS  UNUSUAL RASH Items with * indicate a potential emergency and should be followed up as soon as possible.  Feel free to call the clinic you have any questions or concerns. The clinic phone number is [phone].   I have been informed and understand all the instructions given to me. I know to contact the clinic, my physician, or go to the Emergency Department if any problems should occur. I do not have any questions at this time, but understand that I may call the clinic during office hours   should I have any questions or need assistance in obtaining follow up care.    __________________________________________  _____________  __________ Signature of Patient or Authorized Representative            Date                   Time    __________________________________________ Nurse's Signature

## 2012-07-22 NOTE — Telephone Encounter (Signed)
appts made and printed for pt,pt aware that chemo will be added to 10/30 appt and that i will call with the neuro appt,rec. Faxed to [facility]

## 2012-07-22 NOTE — Patient Instructions (Signed)
He has no evidence for disease progression on his recent scan. Continue maintenance Avastin as scheduled. Referral to neurology for evaluation of the peripheral neuropathy.

## 2012-07-22 NOTE — Progress Notes (Signed)
[facility] [phone]   Fax:([phone]  OFFICE PROGRESS NOTE  [name],[name], [name]  DIAGNOSIS:  #1 Recurrent non-small cell lung cancer consistent with adenocarcinoma. This was initially diagnosed as stage IB (T2a N0 M0) well-differentiated adenocarcinoma of bronchioalveolar type in December 2010.  #2 Acute pulmonary embolism in the right lower lobe lobar, segmental and subsegmental sized pulmonary arteries, diagnosed on 11/04/2011.   PRIOR THERAPY:  1.Status post right upper lobectomy on September 28, 2009 under the care of Dr. Edwyna Shell. The patient refused adjuvant chemotherapy at that time. She had disease recurrence in June of 2012.  2.Status post 4 cycles of systemic chemotherapy with carboplatin for an AUC of 6 and paclitaxel at 200 mg per meter squared and Avastin at 15 mg/kg according to the ECOG protocol #5508.   CURRENT THERAPY: Chemotherapy with Avastin 15 mg/kg according to the ECOG protocol #5508 status post 16 cycles.    INTERVAL HISTORY: [name] 71 y.o. female returns to the clinic today for followup visit. The patient is feeling fine today with no specific complaints except for the persistent peripheral neuropathy. The patient has improvement in her appetite and gained 1 pound since her last visit after she was started on Medrol Dosepak. She also mentioned that her peripheral neuropathy was better when she was taken to steroids. The patient denied having any significant weight loss or night sweats. She denied having any significant chest pain, shortness breath, cough or hemoptysis. She has repeat CT scan of the chest performed recently and she is here for evaluation and discussion of her scan results.  MEDICAL HISTORY: Past Medical History  Diagnosis Date  . Lung cancer 03/08/2011    recurrent  . Hypertension   . Hypothyroidism   . Hypercholesterolemia   . Glaucoma   . Hip pain 09/02/2011  . Foot pain  09/02/2011    ALLERGIES:  is allergic to amitriptyline; codeine; and sulfa antibiotics.  MEDICATIONS:  Current Outpatient Prescriptions  Medication Sig Dispense Refill  . amLODipine (NORVASC) 5 MG tablet Take 10 mg by mouth daily. Per [name], NP      . citalopram (CELEXA) 40 MG tablet Take 40 mg by mouth daily.        . clonazePAM (KLONOPIN) 0.5 MG tablet Take 0.5 mg by mouth 2 (two) times daily as needed. Per [name], NP      . cyanocobalamin (,VITAMIN B-12,) 1000 MCG/ML injection Inject 1,000 mcg into the muscle every 30 (thirty) days.      . dorzolamide-timolol (COSOPT) 22.3-6.8 MG/ML ophthalmic solution 2 drops 2 (two) times daily. Per [name], NP       . levothyroxine (SYNTHROID, LEVOTHROID) 50 MCG tablet Take 50 mcg by mouth 4 (four) times a week. Per [name], NP       . levothyroxine (SYNTHROID, LEVOTHROID) 75 MCG tablet Take 75 mcg by mouth 3 (three) times a week. Per [name], NP       . lidocaine-prilocaine (EMLA) cream Apply 1 application topically as needed.        Marland Kitchen lisinopril (PRINIVIL,ZESTRIL) 40 MG tablet Take 40 mg by mouth daily. Per [name], NP       . rosuvastatin (CRESTOR) 10 MG tablet Take 10 mg by mouth daily.        . prochlorperazine (COMPAZINE) 10 MG tablet Take 10 mg by mouth every 6 (six) hours as needed.  SURGICAL HISTORY:  Past Surgical History  Procedure Date  . Video assisted thoracoscopy 09/28/2009  . Thoracotomy 09/28/2009    mini  . Lung lobectomy 09/28/2009    right upper    REVIEW OF SYSTEMS:  A comprehensive review of systems was negative except for: Constitutional: positive for fatigue Neurological: positive for paresthesia   PHYSICAL EXAMINATION: General appearance: alert, cooperative and no distress Head: Normocephalic, without obvious abnormality, atraumatic Neck: no adenopathy Lymph nodes: Cervical, supraclavicular, and axillary nodes normal. Resp: clear to auscultation bilaterally Cardio:  regular rate and rhythm, S1, S2 normal, no murmur, click, rub or gallop GI: soft, non-tender; bowel sounds normal; no masses,  no organomegaly Extremities: extremities normal, atraumatic, no cyanosis or edema Neurologic: Alert and oriented X 3, normal strength and tone. Normal symmetric reflexes. Normal coordination and gait  ECOG PERFORMANCE STATUS: 1 - Symptomatic but completely ambulatory  Blood pressure 129/75, pulse 79, temperature 97.1 F (36.2 C), temperature source Oral, resp. rate 18, height 5\' 4"  (1.626 m), weight 116 lb 9.6 oz (52.889 kg).  LABORATORY DATA: Lab Results  Component Value Date   WBC 9.9 07/22/2012   HGB 14.0 07/22/2012   HCT 41.8 07/22/2012   MCV 90.5 07/22/2012   PLT 141* 07/22/2012      Chemistry      Component Value Date/Time   NA 138 07/22/2012 1119   NA 136 05/20/2012 1416   NA 142 03/08/2011 0754   K 3.9 07/22/2012 1119   K 4.4 05/20/2012 1416   K 4.2 03/08/2011 0754   CL 104 07/22/2012 1119   CL 99 05/20/2012 1416   CL 102 03/08/2011 0754   CO2 24 07/22/2012 1119   CO2 26 05/20/2012 1416   CO2 27 03/08/2011 0754   BUN 13.0 07/22/2012 1119   BUN 6 05/20/2012 1416   BUN 9 03/08/2011 0754   CREATININE 1.0 07/22/2012 1119   CREATININE 1.11* 05/20/2012 1416   CREATININE 1.0 03/08/2011 0754      Component Value Date/Time   CALCIUM 10.1 07/22/2012 1119   CALCIUM 10.3 05/20/2012 1416   CALCIUM 9.5 03/08/2011 0754   ALKPHOS 74 07/22/2012 1119   ALKPHOS 71 05/20/2012 1416   ALKPHOS 59 03/08/2011 0754   AST 10 07/22/2012 1119   AST 9 05/20/2012 1416   AST 19 03/08/2011 0754   ALT 9 07/22/2012 1119   ALT <5 05/20/2012 1416   BILITOT 0.50 07/22/2012 1119   BILITOT 0.7 05/20/2012 1416   BILITOT 0.50 03/08/2011 0754       RADIOGRAPHIC STUDIES: Ct Chest W Contrast  07/17/2012  *RADIOLOGY REPORT*  Clinical Data: Follow up lung cancer.  CT CHEST WITH CONTRAST  Technique:  Multidetector CT imaging of the chest was performed following the standard protocol during bolus administration of  intravenous contrast.  Contrast: 80mL OMNIPAQUE IOHEXOL 300 MG/ML  SOLN  Comparison: 06/04/2012  Recist protocol 1.1 target lesions:  1. The peripheral right lower lobe ground-glass opacity of 1.5 cm on image 40. Similar 1.5 cm on the prior. 2. Dominant cystic lesion with peripheral nodular components in the left lower lobe. 4.3 cm, image 23, stable.  Findings:  No axillary or supraclavicular adenopathy.  There is no mediastinal or hilar adenopathy.  Calcified left hilar lymph node is identified consistent with previous granulomatous disease.  Moderate changes of central lobular emphysema.  Postsurgical changes involving the right upper lobe is identified without specific features to suggest local tumor recurrence.  Stable ground- glass right lower lobe lesion measuring 1.5  cm, image 40. The 1.4 cm ground-glass nodule in the medial right lower lobe is stable, image 31.  Stable appearance of the area of architectural distortion and ground glass opacification within the posterior left apex, image 10.  Calcified granuloma is again noted within the lingula.   Cavitary lesion with solid components in the superior segment of the left lower lobe is again noted.  This is stable measuring 4.3 x 4.3 cm, image 23.  Review of the visualized osseous structures is negative for lytic or sclerotic bone lesion.  Limited imaging through the upper abdomen shows multiple liver and splenic granulomas.  There is fatty infiltration of the liver which appears similar to previous exam.  Cyst in the right hepatic lobe is stable.  The adrenal glands appear unremarkable.  IMPRESSION:  1.  Similar appearance of the chest with nodules, areas of architectural distortion, and cystic lesion in the left lower lobe. 2.  Again noted are changes of prior granulomatous disease.   Original Report Authenticated By: Rosealee Albee, M.D.     ASSESSMENT: This is a very pleasant 71 years old [place] American female with recurrent non-small cell lung  cancer currently on maintenance Avastin status post 16 cycles. The patient is tolerating her treatment fairly well and she has no evidence for disease progression on his recent scan. She continues to have persistent peripheral neuropathy.  PLAN: I discussed the scan results with the patient today. I recommended for her to continue on maintenance Avastin with the same dose and schedule. Regarding the peripheral neuropathy I will refer the patient to [facility] neurology for evaluation for evaluation of her condition. She would come back for followup visit in 3 weeks for evaluation and management any adverse effect of her chemotherapy. The patient was advised to call immediately if she has any concerning symptoms in the interval.  All questions were answered. The patient knows to call the clinic with any problems, questions or concerns. We can certainly see the patient much sooner if necessary.  I spent 15 minutes counseling the patient face to face. The total time spent in the appointment was 25 minutes.

## 2012-07-24 ENCOUNTER — Telehealth: Payer: Self-pay | Admitting: *Deleted

## 2012-07-24 NOTE — Progress Notes (Signed)
Patient in to clinic on Wednesday 07/22/2012 for evaluation prior to receiving maintenance treatment cycle 17. CT scan was reviewed by Dr. Arbutus Ped who notes that patient has evidence of stable disease. Based on lab results review and history and physical by Dr. Arbutus Ped, patient condition is acceptable for continued treatment.  Correspondence from [phone] study chair regarding CT scan intervals was previously reviewed by Dr. Arbutus Ped. A protocol amendment extending the CT intervals during maintenance therapy is expected, however, sites are requested to maintain the six-week interval for scans as outlined in the protocol, until the amendment has been completed and approved by the IRB.  Patient reported some improvement in her appetite, leg pain (mobility), and joint function in her hands while taking the steroid dose pack, although she believes some of those positive effects have lessened since she finished the last dose.  Sign for infusion was given to Raliegh Ip RN.

## 2012-07-24 NOTE — Telephone Encounter (Signed)
Per staff message and POF I have schedudled appts. JMW  

## 2012-07-27 ENCOUNTER — Telehealth: Payer: Self-pay | Admitting: *Deleted

## 2012-07-27 NOTE — Telephone Encounter (Signed)
Received fax that pt has appt scheduled with [facility] 07/27/12 at 10:30am and is to be seen by Dr Anne Hahn.  SLJ

## 2012-08-03 ENCOUNTER — Telehealth: Payer: Self-pay | Admitting: Internal Medicine

## 2012-08-03 NOTE — Telephone Encounter (Signed)
called and l/m with appt with dr Anne Hahn at [facility],appt is 08/07/12 at 11:00        aom

## 2012-08-11 ENCOUNTER — Telehealth: Payer: Self-pay | Admitting: *Deleted

## 2012-08-11 NOTE — Telephone Encounter (Signed)
Progress note from [facility] given to Dr MKM to review.  SLJ 

## 2012-08-12 ENCOUNTER — Ambulatory Visit (HOSPITAL_BASED_OUTPATIENT_CLINIC_OR_DEPARTMENT_OTHER): Payer: Medicare Other | Admitting: Physician Assistant

## 2012-08-12 ENCOUNTER — Other Ambulatory Visit (HOSPITAL_BASED_OUTPATIENT_CLINIC_OR_DEPARTMENT_OTHER): Payer: Medicare Other | Admitting: Lab

## 2012-08-12 ENCOUNTER — Encounter: Payer: Medicare Other | Admitting: *Deleted

## 2012-08-12 ENCOUNTER — Encounter: Payer: Self-pay | Admitting: Physician Assistant

## 2012-08-12 ENCOUNTER — Telehealth: Payer: Self-pay | Admitting: Internal Medicine

## 2012-08-12 ENCOUNTER — Ambulatory Visit (HOSPITAL_BASED_OUTPATIENT_CLINIC_OR_DEPARTMENT_OTHER): Payer: Medicare Other

## 2012-08-12 VITALS — BP 124/69 | HR 74 | Temp 97.6°F | Resp 20 | Ht 64.0 in | Wt 118.3 lb

## 2012-08-12 DIAGNOSIS — Z5112 Encounter for antineoplastic immunotherapy: Secondary | ICD-10-CM

## 2012-08-12 DIAGNOSIS — C3490 Malignant neoplasm of unspecified part of unspecified bronchus or lung: Secondary | ICD-10-CM

## 2012-08-12 DIAGNOSIS — C3410 Malignant neoplasm of upper lobe, unspecified bronchus or lung: Secondary | ICD-10-CM

## 2012-08-12 DIAGNOSIS — G609 Hereditary and idiopathic neuropathy, unspecified: Secondary | ICD-10-CM

## 2012-08-12 LAB — CBC WITH DIFFERENTIAL/PLATELET
Basophils Absolute: 0 10*3/uL (ref 0.0–0.1)
EOS%: 1.4 % (ref 0.0–7.0)
Eosinophils Absolute: 0.1 10*3/uL (ref 0.0–0.5)
HGB: 13.3 g/dL (ref 11.6–15.9)
MCH: 30.5 pg (ref 25.1–34.0)
NEUT#: 4.2 10*3/uL (ref 1.5–6.5)
RDW: 16.3 % — ABNORMAL HIGH (ref 11.2–14.5)
lymph#: 1.9 10*3/uL (ref 0.9–3.3)

## 2012-08-12 LAB — COMPREHENSIVE METABOLIC PANEL (CC13)
AST: 11 U/L (ref 5–34)
Albumin: 3.8 g/dL (ref 3.5–5.0)
BUN: 17 mg/dL (ref 7.0–26.0)
Calcium: 10.6 mg/dL — ABNORMAL HIGH (ref 8.4–10.4)
Chloride: 106 mEq/L (ref 98–107)
Potassium: 4 mEq/L (ref 3.5–5.1)
Sodium: 140 mEq/L (ref 136–145)
Total Protein: 7.2 g/dL (ref 6.4–8.3)

## 2012-08-12 LAB — UA PROTEIN, DIPSTICK - CHCC: Protein, ur: <30 mg/dL

## 2012-08-12 MED ORDER — SODIUM CHLORIDE 0.9 % IJ SOLN
10.0000 mL | INTRAMUSCULAR | Status: DC | PRN
  Administered 2012-08-12: 10 mL
  Filled 2012-08-12: qty 10

## 2012-08-12 MED ORDER — HEPARIN SOD (PORK) LOCK FLUSH 100 UNIT/ML IV SOLN
500.0000 [IU] | Freq: Once | INTRAVENOUS | Status: AC | PRN
  Administered 2012-08-12: 500 [IU]
  Filled 2012-08-12: qty 5

## 2012-08-12 MED ORDER — SODIUM CHLORIDE 0.9 % IV SOLN
15.0000 mg/kg | Freq: Once | INTRAVENOUS | Status: AC
  Administered 2012-08-12: 800 mg via INTRAVENOUS
  Filled 2012-08-12: qty 32

## 2012-08-12 MED ORDER — SODIUM CHLORIDE 0.9 % IV SOLN
Freq: Once | INTRAVENOUS | Status: AC
  Administered 2012-08-12: 12:00:00 via INTRAVENOUS

## 2012-08-12 MED ORDER — SODIUM CHLORIDE 0.9 % IV SOLN
1000.0000 mL | Freq: Once | INTRAVENOUS | Status: AC
  Administered 2012-08-12: 13:00:00 via INTRAVENOUS

## 2012-08-12 NOTE — Progress Notes (Signed)
Patient in to clinic today for evaluation prior to receiving maintenance cycle 18 treatment. Patient reports stable symptoms as per previous visits, and notes that she was seen by the neurologist and is scheduled for nerve conduction and electromyography testing tomorrow. Based on lab results review and physical exam by Tiana Loft, PA, patient condition is acceptable for continued treatment. Case was reviewed with Dr. Arbutus Ped. Patient is aware that CT scan of chest will be performed following the current cycle, with re-evaluation in three weeks. Patient voiced understanding of plan, as well as understanding of maintenance therapy for as long as her cancer is being controlled and she is tolerating the treatment.  Per request from patient's PCP, a copy of the endocrinologist note from February 2013 was forwarded to [name] at the [place] primary care office, to verify need for continued treatment with Synthroid. Patient states she has enough medication through December and is scheduled for a return visit with Ms. Barry Dienes in January. Mrs. Pujol will follow up with her PCP in 1-2 weeks regarding her Synthroid prescription, as well as follow up for ongoing left leg pain and discomfort in shoulder and finger joints.  Sign for infusion given to Tommy Medal RN.

## 2012-08-12 NOTE — Patient Instructions (Addendum)
Continue with maintenance Avastin as scheduled Follow up with Dr. Arbutus Ped in 3 weeks with a restaging CT scan of your chest. Keep your appointments for your EMG and follow up with Dr. Anne Hahn as scheduled

## 2012-08-12 NOTE — Patient Instructions (Signed)
[facility] Discharge Instructions for Patients Receiving Chemotherapy  Today you received the following chemotherapy agents Avastin To help prevent nausea and vomiting after your treatment, we encourage you to take your nausea medication as prescribed.  If you develop nausea and vomiting that is not controlled by your nausea medication, call the clinic. If it is after clinic hours your family physician or the after hours number for the clinic or go to the Emergency Department.   BELOW ARE SYMPTOMS THAT SHOULD BE REPORTED IMMEDIATELY:  *FEVER GREATER THAN 100.5 F  *CHILLS WITH OR WITHOUT FEVER  NAUSEA AND VOMITING THAT IS NOT CONTROLLED WITH YOUR NAUSEA MEDICATION  *UNUSUAL SHORTNESS OF BREATH  *UNUSUAL BRUISING OR BLEEDING  TENDERNESS IN MOUTH AND THROAT WITH OR WITHOUT PRESENCE OF ULCERS  *URINARY PROBLEMS  *BOWEL PROBLEMS  UNUSUAL RASH Items with * indicate a potential emergency and should be followed up as soon as possible.  One of the nurses will contact you 24 hours after your treatment. Please let the nurse know about any problems that you may have experienced. Feel free to call the clinic you have any questions or concerns. The clinic phone number is [phone].   I have been informed and understand all the instructions given to me. I know to contact the clinic, my physician, or go to the Emergency Department if any problems should occur. I do not have any questions at this time, but understand that I may call the clinic during office hours   should I have any questions or need assistance in obtaining follow up care.    __________________________________________  _____________  __________ Signature of Patient or Authorized Representative            Date                   Time    __________________________________________ Nurse's Signature    

## 2012-08-12 NOTE — Telephone Encounter (Signed)
S/w pt re appts for 11/15 ct and 11/19 lb/fu/tx. Due to MM being out 11/20 all appts can be done on 11/19 per [place] in research.

## 2012-08-12 NOTE — Progress Notes (Signed)
[facility] [phone]   [phone]  OFFICE PROGRESS NOTE  [name],[name], [name]  DIAGNOSIS:  #1 Recurrent non-small cell lung cancer consistent with adenocarcinoma. This was initially diagnosed as stage IB (T2a N0 M0) well-differentiated adenocarcinoma of bronchioalveolar type in December 2010.  #2 Acute pulmonary embolism in the right lower lobe lobar, segmental and subsegmental sized pulmonary arteries, diagnosed on 11/04/2011.   PRIOR THERAPY:  1.Status post right upper lobectomy on September 28, 2009 under the care of Dr. Edwyna Shell. The patient refused adjuvant chemotherapy at that time. She had disease recurrence in June of 2012.  2.Status post 4 cycles of systemic chemotherapy with carboplatin for an AUC of 6 and paclitaxel at 200 mg per meter squared and Avastin at 15 mg/kg according to the ECOG protocol #5508.   CURRENT THERAPY: Chemotherapy with Avastin 15 mg/kg according to the ECOG protocol #5508 status post 17 cycles.    INTERVAL HISTORY: [name] 71 y.o. female returns to the clinic today for followup visit. 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 She was recently evaluated by Dr. Anne Hahn, a neurologist. She is scheduled for an EMG on 08/13/12. She reports her appetite is slightly improved. She has gained 2 pounds. She continues to complain of left leg pain. The patient denied having any significant weight loss or night sweats. She denied having any significant chest pain, shortness breath, cough or hemoptysis. She is having trouble remembering the name of the endocrinologist she saw for her hypothyroidism. This information is apparently needed by her primary care provider.  MEDICAL HISTORY: Past Medical History  Diagnosis Date  . Lung cancer 03/08/2011    recurrent  . Hypertension   . Hypothyroidism   . Hypercholesterolemia   . Glaucoma(365)   . Hip pain 09/02/2011  . Foot pain 09/02/2011      ALLERGIES:  is allergic to amitriptyline; codeine; and sulfa antibiotics.  MEDICATIONS:  Current Outpatient Prescriptions  Medication Sig Dispense Refill  . amLODipine (NORVASC) 5 MG tablet Take 10 mg by mouth daily. Per [name], NP      . citalopram (CELEXA) 40 MG tablet Take 40 mg by mouth daily.        . clonazePAM (KLONOPIN) 0.5 MG tablet Take 0.5 mg by mouth 2 (two) times daily as needed. Per [name], NP      . cyanocobalamin (,VITAMIN B-12,) 1000 MCG/ML injection Inject 1,000 mcg into the muscle every 30 (thirty) days.      . dorzolamide-timolol (COSOPT) 22.3-6.8 MG/ML ophthalmic solution 2 drops 2 (two) times daily. Per [name], NP       . levothyroxine (SYNTHROID, LEVOTHROID) 50 MCG tablet Take 50 mcg by mouth 4 (four) times a week. Per [name], NP       . levothyroxine (SYNTHROID, LEVOTHROID) 75 MCG tablet Take 75 mcg by mouth 3 (three) times a week. Per [name], NP       . lidocaine-prilocaine (EMLA) cream Apply 1 application topically as needed.        Marland Kitchen lisinopril (PRINIVIL,ZESTRIL) 40 MG tablet Take 40 mg by mouth daily. Per [name], NP       . prochlorperazine (COMPAZINE) 10 MG tablet Take 10 mg by mouth every 6 (six) hours as needed.        . rosuvastatin (CRESTOR) 10 MG tablet Take 10 mg by mouth daily.          SURGICAL HISTORY:  Past  Surgical History  Procedure Date  . Video assisted thoracoscopy 09/28/2009  . Thoracotomy 09/28/2009    mini  . Lung lobectomy 09/28/2009    right upper    REVIEW OF SYSTEMS:  A comprehensive review of systems was negative except for: Constitutional: positive for fatigue Neurological: positive for paresthesia   PHYSICAL EXAMINATION: General appearance: alert, cooperative and no distress Head: Normocephalic, without obvious abnormality, atraumatic Neck: no adenopathy Lymph nodes: Cervical, supraclavicular, and axillary nodes normal. Resp: clear to auscultation bilaterally Cardio: regular rate  and rhythm, S1, S2 normal, no murmur, click, rub or gallop GI: soft, non-tender; bowel sounds normal; no masses,  no organomegaly Extremities: extremities normal, atraumatic, no cyanosis or edema Neurologic: Alert and oriented X 3, normal strength and tone. Normal symmetric reflexes. Normal coordination and gait  ECOG PERFORMANCE STATUS: 1 - Symptomatic but completely ambulatory  Blood pressure 124/69, pulse 74, temperature 97.6 F (36.4 C), temperature source Oral, resp. rate 20, height 5\' 4"  (1.626 m), weight 118 lb 4.8 oz (53.661 kg).  LABORATORY DATA: Lab Results  Component Value Date   WBC 6.8 08/12/2012   HGB 13.3 08/12/2012   HCT 39.6 08/12/2012   MCV 91.0 08/12/2012   PLT 152 08/12/2012      Chemistry      Component Value Date/Time   NA 140 08/12/2012 1017   NA 136 05/20/2012 1416   NA 142 03/08/2011 0754   K 4.0 08/12/2012 1017   K 4.4 05/20/2012 1416   K 4.2 03/08/2011 0754   CL 106 08/12/2012 1017   CL 99 05/20/2012 1416   CL 102 03/08/2011 0754   CO2 28 08/12/2012 1017   CO2 26 05/20/2012 1416   CO2 27 03/08/2011 0754   BUN 17.0 08/12/2012 1017   BUN 6 05/20/2012 1416   BUN 9 03/08/2011 0754   CREATININE 0.9 08/12/2012 1017   CREATININE 1.11* 05/20/2012 1416   CREATININE 1.0 03/08/2011 0754      Component Value Date/Time   CALCIUM 10.6* 08/12/2012 1017   CALCIUM 10.3 05/20/2012 1416   CALCIUM 9.5 03/08/2011 0754   ALKPHOS 60 08/12/2012 1017   ALKPHOS 71 05/20/2012 1416   ALKPHOS 59 03/08/2011 0754   AST 11 08/12/2012 1017   AST 9 05/20/2012 1416   AST 19 03/08/2011 0754   ALT 9 08/12/2012 1017   ALT <5 05/20/2012 1416   BILITOT 0.35 08/12/2012 1017   BILITOT 0.7 05/20/2012 1416   BILITOT 0.50 03/08/2011 0754       RADIOGRAPHIC STUDIES: Ct Chest W Contrast  07/17/2012  *RADIOLOGY REPORT*  Clinical Data: Follow up lung cancer.  CT CHEST WITH CONTRAST  Technique:  Multidetector CT imaging of the chest was performed following the standard protocol during bolus administration of  intravenous contrast.  Contrast: 80mL OMNIPAQUE IOHEXOL 300 MG/ML  SOLN  Comparison: 06/04/2012  Recist protocol 1.1 target lesions:  1. The peripheral right lower lobe ground-glass opacity of 1.5 cm on image 40. Similar 1.5 cm on the prior. 2. Dominant cystic lesion with peripheral nodular components in the left lower lobe. 4.3 cm, image 23, stable.  Findings:  No axillary or supraclavicular adenopathy.  There is no mediastinal or hilar adenopathy.  Calcified left hilar lymph node is identified consistent with previous granulomatous disease.  Moderate changes of central lobular emphysema.  Postsurgical changes involving the right upper lobe is identified without specific features to suggest local tumor recurrence.  Stable ground- glass right lower lobe lesion measuring 1.5 cm, image 40. The  1.4 cm ground-glass nodule in the medial right lower lobe is stable, image 31.  Stable appearance of the area of architectural distortion and ground glass opacification within the posterior left apex, image 10.  Calcified granuloma is again noted within the lingula.   Cavitary lesion with solid components in the superior segment of the left lower lobe is again noted.  This is stable measuring 4.3 x 4.3 cm, image 23.  Review of the visualized osseous structures is negative for lytic or sclerotic bone lesion.  Limited imaging through the upper abdomen shows multiple liver and splenic granulomas.  There is fatty infiltration of the liver which appears similar to previous exam.  Cyst in the right hepatic lobe is stable.  The adrenal glands appear unremarkable.  IMPRESSION:  1.  Similar appearance of the chest with nodules, areas of architectural distortion, and cystic lesion in the left lower lobe. 2.  Again noted are changes of prior granulomatous disease.   Original Report Authenticated By: Rosealee Albee, M.D.     ASSESSMENT/PLAN: This is a very pleasant 71 years old [place] American female with recurrent non-small cell lung  cancer currently on maintenance Avastin status post 16 cycles. The patient is tolerating her treatment fairly well and she has no evidence for disease progression on his recent scan. She continues to have persistent peripheral neuropathy. The patient was discussed with Dr. Arbutus Ped. She is encouraged to followup with Dr. Anne Hahn as scheduled and to proceed with the scheduled EMG. Review of our records reveal that she was evaluated by Dr. Romero Belling on 11/29/2011, this note was forwarded to [name] [name] for their records. She'll proceed with cycle #18 according to protocol [id number], maintenance bevacizumab. She'll followup in 3 weeks with Dr. Arbutus Ped with a restaging CT scan of the chest with contrast as well as labs per protocol.  Laural Benes, Abdulrahim Siddiqi E, PA-C   All questions were answered. The patient knows to call the clinic with any problems, questions or concerns. We can certainly see the patient much sooner if necessary.  I spent 20 minutes counseling the patient face to face. The total time spent in the appointment was 30 minutes.

## 2012-08-13 ENCOUNTER — Other Ambulatory Visit: Payer: Self-pay | Admitting: Neurology

## 2012-08-13 DIAGNOSIS — R209 Unspecified disturbances of skin sensation: Secondary | ICD-10-CM

## 2012-08-13 DIAGNOSIS — R269 Unspecified abnormalities of gait and mobility: Secondary | ICD-10-CM

## 2012-08-20 ENCOUNTER — Ambulatory Visit
Admission: RE | Admit: 2012-08-20 | Discharge: 2012-08-20 | Disposition: A | Discharge status: Home / Self Care | Payer: Medicare Other | Source: Ambulatory Visit | Attending: Neurology | Admitting: Neurology

## 2012-08-20 DIAGNOSIS — R269 Unspecified abnormalities of gait and mobility: Secondary | ICD-10-CM

## 2012-08-20 DIAGNOSIS — R209 Unspecified disturbances of skin sensation: Secondary | ICD-10-CM

## 2012-08-26 ENCOUNTER — Other Ambulatory Visit: Payer: Self-pay | Admitting: *Deleted

## 2012-08-28 ENCOUNTER — Telehealth: Payer: Self-pay | Admitting: Medical Oncology

## 2012-08-28 ENCOUNTER — Other Ambulatory Visit (HOSPITAL_BASED_OUTPATIENT_CLINIC_OR_DEPARTMENT_OTHER): Payer: Medicare Other | Admitting: Lab

## 2012-08-28 ENCOUNTER — Ambulatory Visit (HOSPITAL_COMMUNITY)
Admission: RE | Admit: 2012-08-28 | Discharge: 2012-08-28 | Disposition: A | Discharge status: Home / Self Care | Payer: Medicare Other | Source: Ambulatory Visit | Attending: Physician Assistant | Admitting: Physician Assistant

## 2012-08-28 DIAGNOSIS — C3490 Malignant neoplasm of unspecified part of unspecified bronchus or lung: Secondary | ICD-10-CM | POA: Insufficient documentation

## 2012-08-28 LAB — COMPREHENSIVE METABOLIC PANEL (CC13)
BUN: 14 mg/dL (ref 7.0–26.0)
CO2: 28 mEq/L (ref 22–29)
Calcium: 10.4 mg/dL (ref 8.4–10.4)
Creatinine: 1 mg/dL (ref 0.6–1.1)
Glucose: 86 mg/dl (ref 70–99)
Total Bilirubin: 0.41 mg/dL (ref 0.20–1.20)

## 2012-08-28 MED ORDER — IOHEXOL 300 MG/ML  SOLN
80.0000 mL | Freq: Once | INTRAMUSCULAR | Status: AC | PRN
  Administered 2012-08-28: 80 mL via INTRAVENOUS

## 2012-08-28 NOTE — Telephone Encounter (Signed)
Confirming appt . I scheduled lab today prior to CT

## 2012-09-01 ENCOUNTER — Ambulatory Visit: Payer: Medicare Other

## 2012-09-01 ENCOUNTER — Ambulatory Visit (HOSPITAL_BASED_OUTPATIENT_CLINIC_OR_DEPARTMENT_OTHER): Payer: Medicare Other

## 2012-09-01 ENCOUNTER — Encounter: Payer: Medicare Other | Admitting: *Deleted

## 2012-09-01 ENCOUNTER — Other Ambulatory Visit (HOSPITAL_BASED_OUTPATIENT_CLINIC_OR_DEPARTMENT_OTHER): Payer: Medicare Other | Admitting: Lab

## 2012-09-01 ENCOUNTER — Telehealth: Payer: Self-pay | Admitting: Internal Medicine

## 2012-09-01 ENCOUNTER — Ambulatory Visit (HOSPITAL_BASED_OUTPATIENT_CLINIC_OR_DEPARTMENT_OTHER): Payer: Medicare Other | Admitting: Internal Medicine

## 2012-09-01 VITALS — BP 148/74 | HR 69 | Temp 96.1°F | Resp 20 | Ht 64.0 in | Wt 120.0 lb

## 2012-09-01 DIAGNOSIS — C3490 Malignant neoplasm of unspecified part of unspecified bronchus or lung: Secondary | ICD-10-CM

## 2012-09-01 DIAGNOSIS — Z5112 Encounter for antineoplastic immunotherapy: Secondary | ICD-10-CM

## 2012-09-01 DIAGNOSIS — C3410 Malignant neoplasm of upper lobe, unspecified bronchus or lung: Secondary | ICD-10-CM

## 2012-09-01 DIAGNOSIS — E538 Deficiency of other specified B group vitamins: Secondary | ICD-10-CM

## 2012-09-01 LAB — COMPREHENSIVE METABOLIC PANEL (CC13)
ALT: 11 U/L (ref 0–55)
AST: 12 U/L (ref 5–34)
Albumin: 3.9 g/dL (ref 3.5–5.0)
BUN: 18 mg/dL (ref 7.0–26.0)
CO2: 29 mEq/L (ref 22–29)
Calcium: 10.6 mg/dL — ABNORMAL HIGH (ref 8.4–10.4)
Chloride: 104 mEq/L (ref 98–107)
Creatinine: 1.1 mg/dL (ref 0.6–1.1)
Potassium: 4.1 mEq/L (ref 3.5–5.1)

## 2012-09-01 LAB — MAGNESIUM (CC13): Magnesium: 2.2 mg/dl (ref 1.5–2.5)

## 2012-09-01 LAB — CBC WITH DIFFERENTIAL/PLATELET
Eosinophils Absolute: 0.1 10*3/uL (ref 0.0–0.5)
HCT: 39.9 % (ref 34.8–46.6)
HGB: 13.7 g/dL (ref 11.6–15.9)
LYMPH%: 23.9 % (ref 14.0–49.7)
MONO#: 0.5 10*3/uL (ref 0.1–0.9)
NEUT#: 5.6 10*3/uL (ref 1.5–6.5)
NEUT%: 67.5 % (ref 38.4–76.8)
Platelets: 137 10*3/uL — ABNORMAL LOW (ref 145–400)
WBC: 8.3 10*3/uL (ref 3.9–10.3)
lymph#: 2 10*3/uL (ref 0.9–3.3)

## 2012-09-01 LAB — LACTATE DEHYDROGENASE (CC13): LDH: 168 U/L (ref 125–245)

## 2012-09-01 MED ORDER — SODIUM CHLORIDE 0.9 % IJ SOLN
10.0000 mL | INTRAMUSCULAR | Status: DC | PRN
  Administered 2012-09-01: 10 mL
  Filled 2012-09-01: qty 10

## 2012-09-01 MED ORDER — CYANOCOBALAMIN 1000 MCG/ML IJ SOLN
1000.0000 ug | Freq: Once | INTRAMUSCULAR | Status: AC
  Administered 2012-09-01: 1000 ug via INTRAMUSCULAR

## 2012-09-01 MED ORDER — SODIUM CHLORIDE 0.9 % IV SOLN
Freq: Once | INTRAVENOUS | Status: AC
  Administered 2012-09-01: 13:00:00 via INTRAVENOUS

## 2012-09-01 MED ORDER — HEPARIN SOD (PORK) LOCK FLUSH 100 UNIT/ML IV SOLN
500.0000 [IU] | Freq: Once | INTRAVENOUS | Status: AC | PRN
  Administered 2012-09-01: 500 [IU]
  Filled 2012-09-01: qty 5

## 2012-09-01 MED ORDER — SODIUM CHLORIDE 0.9 % IV SOLN
15.0000 mg/kg | Freq: Once | INTRAVENOUS | Status: AC
  Administered 2012-09-01: 800 mg via INTRAVENOUS
  Filled 2012-09-01: qty 32

## 2012-09-01 NOTE — Patient Instructions (Signed)
[facility] Discharge Instructions for Patients Receiving Chemotherapy  Today you received the following chemotherapy agents Avastin To help prevent nausea and vomiting after your treatment, we encourage you to take your nausea medication as prescribed.  If you develop nausea and vomiting that is not controlled by your nausea medication, call the clinic. If it is after clinic hours your family physician or the after hours number for the clinic or go to the Emergency Department.   BELOW ARE SYMPTOMS THAT SHOULD BE REPORTED IMMEDIATELY:  *FEVER GREATER THAN 100.5 F  *CHILLS WITH OR WITHOUT FEVER  NAUSEA AND VOMITING THAT IS NOT CONTROLLED WITH YOUR NAUSEA MEDICATION  *UNUSUAL SHORTNESS OF BREATH  *UNUSUAL BRUISING OR BLEEDING  TENDERNESS IN MOUTH AND THROAT WITH OR WITHOUT PRESENCE OF ULCERS  *URINARY PROBLEMS  *BOWEL PROBLEMS  UNUSUAL RASH Items with * indicate a potential emergency and should be followed up as soon as possible.  One of the nurses will contact you 24 hours after your treatment. Please let the nurse know about any problems that you may have experienced. Feel free to call the clinic you have any questions or concerns. The clinic phone number is [phone].   I have been informed and understand all the instructions given to me. I know to contact the clinic, my physician, or go to the Emergency Department if any problems should occur. I do not have any questions at this time, but understand that I may call the clinic during office hours   should I have any questions or need assistance in obtaining follow up care.    __________________________________________  _____________  __________ Signature of Patient or Authorized Representative            Date                   Time    __________________________________________ Nurse's Signature    

## 2012-09-01 NOTE — Progress Notes (Signed)
[facility] [phone]   [phone]  OFFICE PROGRESS NOTE  [name],[name], [name] No address on file  DIAGNOSIS:  #1 Recurrent non-small cell lung cancer consistent with adenocarcinoma. This was initially diagnosed as stage IB (T2a N0 M0) well-differentiated adenocarcinoma of bronchioalveolar type in December 2010.  #2 Acute pulmonary embolism in the right lower lobe lobar, segmental and subsegmental sized pulmonary arteries, diagnosed on 11/04/2011.   PRIOR THERAPY:  1.Status post right upper lobectomy on September 28, 2009 under the care of Dr. Edwyna Shell. The patient refused adjuvant chemotherapy at that time. She had disease recurrence in June of 2012.  2.Status post 4 cycles of systemic chemotherapy with carboplatin for an AUC of 6 and paclitaxel at 200 mg per meter squared and Avastin at 15 mg/kg according to the ECOG protocol #5508.   CURRENT THERAPY: Chemotherapy with Avastin 15 mg/kg according to the ECOG protocol #5508 status post 18 cycles.   INTERVAL HISTORY: [name] 71 y.o. female returns to the clinic today for followup visit. The patient is feeling fine today with no specific complaints except for the persistent peripheral neuropathy. She was evaluated by neurology and currently on treatment with vitamin B12 injection on monthly basis. She is tolerating her treatment with Avastin fairly well with no significant adverse effects. The patient denied having any significant nausea or vomiting. She denied having any significant weight loss or night sweats. She has no significant chest pain, shortness breath, cough or hemoptysis. The patient has repeat CT scan of the chest performed recently and she is here today for evaluation and discussion of her scan results.  MEDICAL HISTORY: Past Medical History  Diagnosis Date  . Lung cancer 03/08/2011    recurrent  . Hypertension   . Hypothyroidism   . Hypercholesterolemia   . Glaucoma(365)   .  Hip pain 09/02/2011  . Foot pain 09/02/2011    ALLERGIES:  is allergic to amitriptyline; codeine; and sulfa antibiotics.  MEDICATIONS:  Current Outpatient Prescriptions  Medication Sig Dispense Refill  . amLODipine (NORVASC) 5 MG tablet Take 10 mg by mouth daily. Per [name], NP      . citalopram (CELEXA) 40 MG tablet Take 40 mg by mouth daily.        . clonazePAM (KLONOPIN) 0.5 MG tablet Take 0.5 mg by mouth 2 (two) times daily as needed. Per [name], NP      . cyanocobalamin (,VITAMIN B-12,) 1000 MCG/ML injection Inject 1,000 mcg into the muscle every 30 (thirty) days.      . dorzolamide-timolol (COSOPT) 22.3-6.8 MG/ML ophthalmic solution 2 drops 2 (two) times daily. Per [name], NP       . levothyroxine (SYNTHROID, LEVOTHROID) 50 MCG tablet Take 50 mcg by mouth 4 (four) times a week. Per [name], NP       . levothyroxine (SYNTHROID, LEVOTHROID) 75 MCG tablet Take 75 mcg by mouth 3 (three) times a week. Per [name], NP       . lidocaine-prilocaine (EMLA) cream Apply 1 application topically as needed.        Marland Kitchen lisinopril (PRINIVIL,ZESTRIL) 40 MG tablet Take 40 mg by mouth daily. Per [name], NP       . prochlorperazine (COMPAZINE) 10 MG tablet Take 10 mg by mouth every 6 (six) hours as needed.        . rosuvastatin (CRESTOR) 10 MG tablet Take 10 mg by mouth daily.  SURGICAL HISTORY:  Past Surgical History  Procedure Date  . Video assisted thoracoscopy 09/28/2009  . Thoracotomy 09/28/2009    mini  . Lung lobectomy 09/28/2009    right upper    REVIEW OF SYSTEMS:  A comprehensive review of systems was negative except for: Constitutional: positive for fatigue Neurological: positive for paresthesia   PHYSICAL EXAMINATION: General appearance: alert, cooperative and no distress Head: Normocephalic, without obvious abnormality, atraumatic Neck: no adenopathy Lymph nodes: Cervical, supraclavicular, and axillary nodes normal. Resp: clear to  auscultation bilaterally Cardio: regular rate and rhythm, S1, S2 normal, no murmur, click, rub or gallop GI: soft, non-tender; bowel sounds normal; no masses,  no organomegaly Extremities: extremities normal, atraumatic, no cyanosis or edema  ECOG PERFORMANCE STATUS: 1 - Symptomatic but completely ambulatory  Blood pressure 148/74, pulse 69, temperature 96.1 F (35.6 C), temperature source Oral, resp. rate 20, height 5\' 4"  (1.626 m), weight 120 lb (54.432 kg).  LABORATORY DATA: Lab Results  Component Value Date   WBC 8.3 09/01/2012   HGB 13.7 09/01/2012   HCT 39.9 09/01/2012   MCV 92.0 09/01/2012   PLT 137* 09/01/2012      Chemistry      Component Value Date/Time   NA 140 09/01/2012 1037   NA 136 05/20/2012 1416   NA 142 03/08/2011 0754   K 4.1 09/01/2012 1037   K 4.4 05/20/2012 1416   K 4.2 03/08/2011 0754   CL 104 09/01/2012 1037   CL 99 05/20/2012 1416   CL 102 03/08/2011 0754   CO2 29 09/01/2012 1037   CO2 26 05/20/2012 1416   CO2 27 03/08/2011 0754   BUN 18.0 09/01/2012 1037   BUN 6 05/20/2012 1416   BUN 9 03/08/2011 0754   CREATININE 1.1 09/01/2012 1037   CREATININE 1.11* 05/20/2012 1416   CREATININE 1.0 03/08/2011 0754      Component Value Date/Time   CALCIUM 10.6* 09/01/2012 1037   CALCIUM 10.3 05/20/2012 1416   CALCIUM 9.5 03/08/2011 0754   ALKPHOS 68 09/01/2012 1037   ALKPHOS 71 05/20/2012 1416   ALKPHOS 59 03/08/2011 0754   AST 12 09/01/2012 1037   AST 9 05/20/2012 1416   AST 19 03/08/2011 0754   ALT 11 09/01/2012 1037   ALT <5 05/20/2012 1416   BILITOT 0.49 09/01/2012 1037   BILITOT 0.7 05/20/2012 1416   BILITOT 0.50 03/08/2011 0754       RADIOGRAPHIC STUDIES: Ct Chest W Contrast  08/28/2012  *RADIOLOGY REPORT*  Clinical Data: Restaging lung cancer  CT CHEST WITH CONTRAST  Technique:  Multidetector CT imaging of the chest was performed following the standard protocol during bolus administration of intravenous contrast.  Contrast: 80mL OMNIPAQUE IOHEXOL 300 MG/ML  SOLN   Comparison: None.  ---  RECIST 1.1  Target Lesions:  1.  Posterior right lower lobe ground glass nodule - 15 mm (series 5/image 42), unchanged  2.  Superior segment left lower lobe cavitary lesion - 44 mm (series 5/image 24), previously 43 mm  Non-target Lesions:  1. Scattered bilateral lung nodules/opacities - present  ---  Findings: Stable postsurgical changes in the anterior right upper lobe.  Stable ground-glass opacity/architectural distortion in the left upper lobe (series 5/image 11).  4.1 x 4.4 cm cavitary lesion in the superior segment left lower lobe (series 5/image 24), previously 4.4 x 4.3 cm.  Stable 1.5 x 1.5 cm ground-glass nodule in the posterior right lower lobe (series 5/image 42).  Additional scattered ground-glass nodule/opacities bilaterally (for example, series  5/images 9, 33, and 42).  Calcified granuloma in the lingula (series 5/image 27).  Underlying moderate centrilobular emphysematous changes.  No pleural effusion or pneumothorax.  Visualized thyroid is mildly heterogeneous.  The heart is normal in size.  No pericardial effusion.  Coronary atherosclerosis.  Atherosclerotic calcifications of the aortic arch.  No suspicious mediastinal, hilar, or axillary lymphadenopathy.  Right chest port.  Visualized upper abdomen is notable for calcified granulomata in the liver and spleen.  Mild degenerative changes of the visualized thoracolumbar spine.  IMPRESSION: Postsurgical changes in the right upper lobe.  No interval change from prior CT.  Stable bilateral ground-glass opacities / nodules, as described above.  RECIST 1.1 as above.   Original Report Authenticated By: Charline Bills, M.D.    Mr Cervical Spine Wo Contrast  08/20/2012  This examination was performed at [facility]. The interpretation will be provided by [facility]   Original Report Authenticated By: Marin Roberts, M.D.     ASSESSMENT: This is a very pleasant 71  years old white female with history of recurrent non-small cell lung cancer, adenocarcinoma. She is currently on maintenance Avastin 15 mg/kg every 3 weeks. The patient is tolerating her treatment fairly well and she has no evidence for disease progression on his recent scan.  PLAN: I discussed the scan results with the patient. I recommended for her to continue on the same maintenance treatment with Avastin every 3 weeks. She would come back for followup visit in 3 weeks with the start of the next cycle of her treatment. The patient will continue on monthly B12 injection. She was advised to call me immediately if she has any concerning symptoms in the interval.  All questions were answered. The patient knows to call the clinic with any problems, questions or concerns. We can certainly see the patient much sooner if necessary.  I spent 15 minutes counseling the patient face to face. The total time spent in the appointment was 25 minutes.

## 2012-09-01 NOTE — Patient Instructions (Signed)
No evidence for disease progression on his recent scan. Followup visit in 3 weeks with the next cycle of the chemotherapy.

## 2012-09-01 NOTE — Telephone Encounter (Signed)
gv pt appt schedule for December. Per Arline Asp (research) monthly inj appts were not scheduled - she will keep watch on this and make sure pt gets inj during times when she is here for chemo. Pt aware.

## 2012-09-01 NOTE — Progress Notes (Signed)
Patient in to clinic today for evaluation prior to receiving maintenance treatment cycle 19. Due to physician scheduled out of office tomorrow, and need to coordinate patient assessments and treatment to occur on the same day, Cycle 19 evaluation and treatment are occurring one day early. In addition, patient will continue with Tuesday treatments until January to avoid conflicts with office closure due to holidays. Overall, patient states that she thinks she is doing better. She has been followed by her neurologist, and notes no significant change in her peripheral neuropathy symptoms. Based on lab results review and physical exam by Dr. Arbutus Ped, patient condition is deemed acceptable for treatment today. Sign for infusion given to Tommy Medal RN.  Discussed upcoming changes to the informed consent document, including change in CT schedule during maintenance therapy to every 9 weeks (every 3 cycles) as opposed to every six weeks (every two cycles). Patient is aware that an updated consent form will be presented to her once approved by the IRB, and that she will be requested to sign the updated document if she wishes to continue participation in the research study.

## 2012-09-22 ENCOUNTER — Other Ambulatory Visit (HOSPITAL_BASED_OUTPATIENT_CLINIC_OR_DEPARTMENT_OTHER): Payer: Medicare Other | Admitting: Lab

## 2012-09-22 ENCOUNTER — Telehealth: Payer: Self-pay | Admitting: *Deleted

## 2012-09-22 ENCOUNTER — Ambulatory Visit (HOSPITAL_BASED_OUTPATIENT_CLINIC_OR_DEPARTMENT_OTHER): Payer: Medicare Other | Admitting: Physician Assistant

## 2012-09-22 ENCOUNTER — Encounter: Payer: Medicare Other | Admitting: *Deleted

## 2012-09-22 ENCOUNTER — Ambulatory Visit (HOSPITAL_BASED_OUTPATIENT_CLINIC_OR_DEPARTMENT_OTHER): Payer: Medicare Other

## 2012-09-22 ENCOUNTER — Telehealth: Payer: Self-pay | Admitting: Internal Medicine

## 2012-09-22 ENCOUNTER — Encounter: Payer: Self-pay | Admitting: Physician Assistant

## 2012-09-22 VITALS — BP 154/78 | HR 70 | Temp 96.9°F | Resp 20 | Ht 64.0 in | Wt 123.4 lb

## 2012-09-22 DIAGNOSIS — C3490 Malignant neoplasm of unspecified part of unspecified bronchus or lung: Secondary | ICD-10-CM

## 2012-09-22 DIAGNOSIS — C3410 Malignant neoplasm of upper lobe, unspecified bronchus or lung: Secondary | ICD-10-CM

## 2012-09-22 DIAGNOSIS — I2699 Other pulmonary embolism without acute cor pulmonale: Secondary | ICD-10-CM

## 2012-09-22 DIAGNOSIS — Z5112 Encounter for antineoplastic immunotherapy: Secondary | ICD-10-CM

## 2012-09-22 LAB — COMPREHENSIVE METABOLIC PANEL (CC13)
ALT: 18 U/L (ref 0–55)
AST: 14 U/L (ref 5–34)
Albumin: 3.7 g/dL (ref 3.5–5.0)
Alkaline Phosphatase: 69 U/L (ref 40–150)
BUN: 14 mg/dL (ref 7.0–26.0)
CO2: 29 meq/L (ref 22–29)
Calcium: 10.3 mg/dL (ref 8.4–10.4)
Chloride: 104 meq/L (ref 98–107)
Creatinine: 1 mg/dL (ref 0.6–1.1)
Glucose: 83 mg/dL (ref 70–99)
Potassium: 4.1 meq/L (ref 3.5–5.1)
Sodium: 141 meq/L (ref 136–145)
Total Bilirubin: 0.54 mg/dL (ref 0.20–1.20)
Total Protein: 7.4 g/dL (ref 6.4–8.3)

## 2012-09-22 LAB — CBC WITH DIFFERENTIAL/PLATELET
Basophils Absolute: 0 10*3/uL (ref 0.0–0.1)
EOS%: 1.1 % (ref 0.0–7.0)
Eosinophils Absolute: 0.1 10*3/uL (ref 0.0–0.5)
HGB: 13.6 g/dL (ref 11.6–15.9)
LYMPH%: 20.2 % (ref 14.0–49.7)
MCH: 30.4 pg (ref 25.1–34.0)
MCV: 92.9 fL (ref 79.5–101.0)
MONO%: 6.8 % (ref 0.0–14.0)
NEUT#: 6.1 10*3/uL (ref 1.5–6.5)
Platelets: 149 10*3/uL (ref 145–400)
RBC: 4.48 10*6/uL (ref 3.70–5.45)
RDW: 15.2 % — ABNORMAL HIGH (ref 11.2–14.5)

## 2012-09-22 LAB — UA PROTEIN, DIPSTICK - CHCC: Protein, ur: <30 mg/dL

## 2012-09-22 LAB — LACTATE DEHYDROGENASE (CC13): LDH: 172 U/L (ref 125–245)

## 2012-09-22 LAB — MAGNESIUM (CC13): Magnesium: 2.2 mg/dL (ref 1.5–2.5)

## 2012-09-22 MED ORDER — SODIUM CHLORIDE 0.9 % IV SOLN
15.0000 mg/kg | Freq: Once | INTRAVENOUS | Status: AC
  Administered 2012-09-22: 800 mg via INTRAVENOUS
  Filled 2012-09-22: qty 32

## 2012-09-22 MED ORDER — SODIUM CHLORIDE 0.9 % IV SOLN
Freq: Once | INTRAVENOUS | Status: AC
  Administered 2012-09-22: 13:00:00 via INTRAVENOUS

## 2012-09-22 MED ORDER — SODIUM CHLORIDE 0.9 % IV SOLN: Freq: Once | INTRAVENOUS | Status: DC

## 2012-09-22 MED ORDER — SODIUM CHLORIDE 0.9 % IJ SOLN
10.0000 mL | INTRAMUSCULAR | Status: DC | PRN
  Filled 2012-09-22: qty 10

## 2012-09-22 MED ORDER — HEPARIN SOD (PORK) LOCK FLUSH 100 UNIT/ML IV SOLN
500.0000 [IU] | Freq: Once | INTRAVENOUS | Status: DC | PRN
  Filled 2012-09-22: qty 5

## 2012-09-22 NOTE — Progress Notes (Signed)
Patient in to clinic today for evaluation prior to receiving maintenance bevacizumab cycle 20. Patient reports that she is gaining weight and generally is feeling better. She traveled to [place] with her daughter for the Thanksgiving holiday. Based on lab results review and physical exam by Tiana Loft, PA, patient condition is acceptable for continued treatment today.   Discussed changes to the ICF with patient today including change in frequency of CT scans to every three cycles, instead of every two cycles, during maintenance therapy. Patient is aware that we are awaiting a correction to a clerical error in the revised consent form, and that she will have the opportunity to review the new information at her next visit, and proceed with signing the revision to indicate her intent to continue participation in the study.  Sign for infusion given to [employer].

## 2012-09-22 NOTE — Telephone Encounter (Signed)
Per staff message and POF I have scheduled appt.  JMW  

## 2012-09-22 NOTE — Patient Instructions (Addendum)
[facility] Discharge Instructions for Patients Receiving Chemotherapy  Today you received the following chemotherapy agents avastin  To help prevent nausea and vomiting after your treatment, we encourage you to take your nausea medication  and take it as often as prescribed   If you develop nausea and vomiting that is not controlled by your nausea medication, call the clinic. If it is after clinic hours your family physician or the after hours number for the clinic or go to the Emergency Department.   BELOW ARE SYMPTOMS THAT SHOULD BE REPORTED IMMEDIATELY:  *FEVER GREATER THAN 100.5 F  *CHILLS WITH OR WITHOUT FEVER  NAUSEA AND VOMITING THAT IS NOT CONTROLLED WITH YOUR NAUSEA MEDICATION  *UNUSUAL SHORTNESS OF BREATH  *UNUSUAL BRUISING OR BLEEDING  TENDERNESS IN MOUTH AND THROAT WITH OR WITHOUT PRESENCE OF ULCERS  *URINARY PROBLEMS  *BOWEL PROBLEMS  UNUSUAL RASH Items with * indicate a potential emergency and should be followed up as soon as possible.  One of the nurses will contact you 24 hours after your treatment. Please let the nurse know about any problems that you may have experienced. Feel free to call the clinic you have any questions or concerns. The clinic phone number is [phone].   I have been informed and understand all the instructions given to me. I know to contact the clinic, my physician, or go to the Emergency Department if any problems should occur. I do not have any questions at this time, but understand that I may call the clinic during office hours   should I have any questions or need assistance in obtaining follow up care.    __________________________________________  _____________  __________ Signature of Patient or Authorized Representative            Date                   Time    __________________________________________ Nurse's Signature    

## 2012-09-22 NOTE — Telephone Encounter (Signed)
Gave pt appt for December 2013 and January 2014 lab, MD and chemo, CT will be scheduled by Radiology

## 2012-09-22 NOTE — Patient Instructions (Addendum)
Follow up in 3 weeks prior to your next cycle of maintenance Avastin

## 2012-09-24 ENCOUNTER — Telehealth: Payer: Self-pay | Admitting: Internal Medicine

## 2012-09-24 NOTE — Telephone Encounter (Signed)
Talked to patient and gave her appt for 10/13/12

## 2012-09-25 NOTE — Progress Notes (Signed)
[facility] [phone]   [phone]  OFFICE PROGRESS NOTE  [name],[name], [name]  DIAGNOSIS:  #1 Recurrent non-small cell lung cancer consistent with adenocarcinoma. This was initially diagnosed as stage IB (T2a N0 M0) well-differentiated adenocarcinoma of bronchioalveolar type in December 2010.  #2 Acute pulmonary embolism in the right lower lobe lobar, segmental and subsegmental sized pulmonary arteries, diagnosed on 11/04/2011.   PRIOR THERAPY:  1.Status post right upper lobectomy on September 28, 2009 under the care of Dr. Edwyna Shell. The patient refused adjuvant chemotherapy at that time. She had disease recurrence in June of 2012.  2.Status post 4 cycles of systemic chemotherapy with carboplatin for an AUC of 6 and paclitaxel at 200 mg per meter squared and Avastin at 15 mg/kg according to the ECOG protocol #5508.   CURRENT THERAPY: Chemotherapy with Avastin 15 mg/kg according to the ECOG protocol #5508 status post 19 cycles.   INTERVAL HISTORY: SAMIE BARCLIFT 71 y.o. female returns to the clinic today for followup visit. The patient is feeling fine today with no specific complaints except for the persistent peripheral neuropathy which at this point is stable. She was evaluated by neurology and currently on treatment with vitamin B12 injection on monthly basis. She is tolerating her treatment with Avastin fairly well with no significant adverse effects. The patient denied having any significant nausea or vomiting. She denied having any significant weight loss or night sweats. She has no significant chest pain, shortness breath, cough or hemoptysis. She reports that the range of motion in both of her shoulders has improved significantly. She continues to have some fatigue.  MEDICAL HISTORY: Past Medical History  Diagnosis Date  . Lung cancer 03/08/2011    recurrent  . Hypertension   . Hypothyroidism   . Hypercholesterolemia   .  Glaucoma(365)   . Hip pain 09/02/2011  . Foot pain 09/02/2011    ALLERGIES:  is allergic to amitriptyline; codeine; and sulfa antibiotics.  MEDICATIONS:  Current Outpatient Prescriptions  Medication Sig Dispense Refill  . amLODipine (NORVASC) 5 MG tablet Take 10 mg by mouth daily. Per [name], NP      . citalopram (CELEXA) 40 MG tablet Take 40 mg by mouth daily.        . clonazePAM (KLONOPIN) 0.5 MG tablet Take 0.5 mg by mouth 2 (two) times daily as needed. Per [name], NP      . cyanocobalamin (,VITAMIN B-12,) 1000 MCG/ML injection Inject 1,000 mcg into the muscle every 30 (thirty) days.      . dorzolamide-timolol (COSOPT) 22.3-6.8 MG/ML ophthalmic solution 2 drops 2 (two) times daily. Per [name], NP       . levothyroxine (SYNTHROID, LEVOTHROID) 50 MCG tablet Take 50 mcg by mouth 4 (four) times a week. Per [name], NP       . levothyroxine (SYNTHROID, LEVOTHROID) 75 MCG tablet Take 75 mcg by mouth 3 (three) times a week. Per [name], NP       . lidocaine-prilocaine (EMLA) cream Apply 1 application topically as needed.        Marland Kitchen lisinopril (PRINIVIL,ZESTRIL) 40 MG tablet Take 40 mg by mouth daily. Per [name], NP       . prochlorperazine (COMPAZINE) 10 MG tablet Take 10 mg by mouth every 6 (six) hours as needed.        . rosuvastatin (CRESTOR) 10 MG tablet Take 10 mg by mouth daily.  SURGICAL HISTORY:  Past Surgical History  Procedure Date  . Video assisted thoracoscopy 09/28/2009  . Thoracotomy 09/28/2009    mini  . Lung lobectomy 09/28/2009    right upper    REVIEW OF SYSTEMS:  A comprehensive review of systems was negative except for: Constitutional: positive for fatigue Neurological: positive for paresthesia   PHYSICAL EXAMINATION: General appearance: alert, cooperative and no distress Head: Normocephalic, without obvious abnormality, atraumatic Neck: no adenopathy Lymph nodes: Cervical, supraclavicular, and axillary nodes  normal. Resp: clear to auscultation bilaterally Cardio: regular rate and rhythm, S1, S2 normal, no murmur, click, rub or gallop GI: soft, non-tender; bowel sounds normal; no masses,  no organomegaly Extremities: extremities normal, atraumatic, no cyanosis or edema  ECOG PERFORMANCE STATUS: 1 - Symptomatic but completely ambulatory  Blood pressure 154/78, pulse 70, temperature 96.9 F (36.1 C), temperature source Oral, resp. rate 20, height 5\' 4"  (1.626 m), weight 123 lb 6.4 oz (55.974 kg).  LABORATORY DATA: Lab Results  Component Value Date   WBC 8.6 09/22/2012   HGB 13.6 09/22/2012   HCT 41.6 09/22/2012   MCV 92.9 09/22/2012   PLT 149 09/22/2012      Chemistry      Component Value Date/Time   NA 141 09/22/2012 1118   NA 136 05/20/2012 1416   NA 142 03/08/2011 0754   K 4.1 09/22/2012 1118   K 4.4 05/20/2012 1416   K 4.2 03/08/2011 0754   CL 104 09/22/2012 1118   CL 99 05/20/2012 1416   CL 102 03/08/2011 0754   CO2 29 09/22/2012 1118   CO2 26 05/20/2012 1416   CO2 27 03/08/2011 0754   BUN 14.0 09/22/2012 1118   BUN 6 05/20/2012 1416   BUN 9 03/08/2011 0754   CREATININE 1.0 09/22/2012 1118   CREATININE 1.11* 05/20/2012 1416   CREATININE 1.0 03/08/2011 0754      Component Value Date/Time   CALCIUM 10.3 09/22/2012 1118   CALCIUM 10.3 05/20/2012 1416   CALCIUM 9.5 03/08/2011 0754   ALKPHOS 69 09/22/2012 1118   ALKPHOS 71 05/20/2012 1416   ALKPHOS 59 03/08/2011 0754   AST 14 09/22/2012 1118   AST 9 05/20/2012 1416   AST 19 03/08/2011 0754   ALT 18 09/22/2012 1118   ALT <5 05/20/2012 1416   BILITOT 0.54 09/22/2012 1118   BILITOT 0.7 05/20/2012 1416   BILITOT 0.50 03/08/2011 0754       RADIOGRAPHIC STUDIES: Ct Chest W Contrast  08/28/2012  *RADIOLOGY REPORT*  Clinical Data: Restaging lung cancer  CT CHEST WITH CONTRAST  Technique:  Multidetector CT imaging of the chest was performed following the standard protocol during bolus administration of intravenous contrast.  Contrast: 80mL OMNIPAQUE  IOHEXOL 300 MG/ML  SOLN  Comparison: None.  ---  RECIST 1.1  Target Lesions:  1.  Posterior right lower lobe ground glass nodule - 15 mm (series 5/image 42), unchanged  2.  Superior segment left lower lobe cavitary lesion - 44 mm (series 5/image 24), previously 43 mm  Non-target Lesions:  1. Scattered bilateral lung nodules/opacities - present  ---  Findings: Stable postsurgical changes in the anterior right upper lobe.  Stable ground-glass opacity/architectural distortion in the left upper lobe (series 5/image 11).  4.1 x 4.4 cm cavitary lesion in the superior segment left lower lobe (series 5/image 24), previously 4.4 x 4.3 cm.  Stable 1.5 x 1.5 cm ground-glass nodule in the posterior right lower lobe (series 5/image 42).  Additional scattered ground-glass nodule/opacities bilaterally (for  example, series 5/images 9, 33, and 42).  Calcified granuloma in the lingula (series 5/image 27).  Underlying moderate centrilobular emphysematous changes.  No pleural effusion or pneumothorax.  Visualized thyroid is mildly heterogeneous.  The heart is normal in size.  No pericardial effusion.  Coronary atherosclerosis.  Atherosclerotic calcifications of the aortic arch.  No suspicious mediastinal, hilar, or axillary lymphadenopathy.  Right chest port.  Visualized upper abdomen is notable for calcified granulomata in the liver and spleen.  Mild degenerative changes of the visualized thoracolumbar spine.  IMPRESSION: Postsurgical changes in the right upper lobe.  No interval change from prior CT.  Stable bilateral ground-glass opacities / nodules, as described above.  RECIST 1.1 as above.   Original Report Authenticated By: Charline Bills, M.D.    Mr Cervical Spine Wo Contrast  08/20/2012  This examination was performed at [facility] at [facility]. The interpretation will be provided by [facility]   Original Report Authenticated By: Marin Roberts, M.D.     ASSESSMENT/PLAN:  This is a very pleasant 71 years old white female with history of recurrent non-small cell lung cancer, adenocarcinoma. She is currently on maintenance Avastin 15 mg/kg every 3 weeks. The patient is tolerating her treatment fairly well and she has no evidence for disease progression on her recent scan. The patient was discussed with Dr. Arbutus Ped. She'll proceed with her maintenance Avastin as scheduled. She will followup in 3 weeks with a repeat CBC differential, C. met and urine protein dipstick to as well as any pertinent research labs. She will continue with her monthly B12 injections. She'll be due for restaging CT of the chest on 10/29/2012 with a followup appointment as well as next maintenance Avastin on 11/04/2012.  Laural Benes, Hallie Ertl E, PA-C   All questions were answered. The patient knows to call the clinic with any problems, questions or concerns. We can certainly see the patient much sooner if necessary.  I spent 20 minutes counseling the patient face to face. The total time spent in the appointment was 30 minutes.

## 2012-10-13 ENCOUNTER — Encounter: Payer: Medicare Other | Admitting: *Deleted

## 2012-10-13 ENCOUNTER — Ambulatory Visit (HOSPITAL_BASED_OUTPATIENT_CLINIC_OR_DEPARTMENT_OTHER): Payer: Medicare Other | Admitting: Physician Assistant

## 2012-10-13 ENCOUNTER — Ambulatory Visit (HOSPITAL_BASED_OUTPATIENT_CLINIC_OR_DEPARTMENT_OTHER): Payer: Medicare Other

## 2012-10-13 ENCOUNTER — Encounter: Payer: Self-pay | Admitting: Physician Assistant

## 2012-10-13 ENCOUNTER — Other Ambulatory Visit (HOSPITAL_BASED_OUTPATIENT_CLINIC_OR_DEPARTMENT_OTHER): Payer: Medicare Other | Admitting: Lab

## 2012-10-13 ENCOUNTER — Encounter: Payer: Self-pay | Admitting: *Deleted

## 2012-10-13 VITALS — BP 153/70 | HR 20 | Temp 98.1°F | Resp 71

## 2012-10-13 VITALS — BP 152/75 | HR 18 | Temp 98.2°F | Resp 82 | Ht 64.0 in | Wt 123.2 lb

## 2012-10-13 DIAGNOSIS — C3410 Malignant neoplasm of upper lobe, unspecified bronchus or lung: Secondary | ICD-10-CM

## 2012-10-13 DIAGNOSIS — I2699 Other pulmonary embolism without acute cor pulmonale: Secondary | ICD-10-CM

## 2012-10-13 DIAGNOSIS — C3490 Malignant neoplasm of unspecified part of unspecified bronchus or lung: Secondary | ICD-10-CM

## 2012-10-13 DIAGNOSIS — I10 Essential (primary) hypertension: Secondary | ICD-10-CM

## 2012-10-13 DIAGNOSIS — Z5112 Encounter for antineoplastic immunotherapy: Secondary | ICD-10-CM

## 2012-10-13 DIAGNOSIS — E538 Deficiency of other specified B group vitamins: Secondary | ICD-10-CM

## 2012-10-13 LAB — COMPREHENSIVE METABOLIC PANEL (CC13)
ALT: 24 U/L (ref 0–55)
AST: 19 U/L (ref 5–34)
Albumin: 3.7 g/dL (ref 3.5–5.0)
Alkaline Phosphatase: 71 U/L (ref 40–150)
Calcium: 10.3 mg/dL (ref 8.4–10.4)
Chloride: 105 mEq/L (ref 98–107)
Potassium: 3.8 mEq/L (ref 3.5–5.1)
Sodium: 141 mEq/L (ref 136–145)
Total Protein: 7.3 g/dL (ref 6.4–8.3)

## 2012-10-13 LAB — CBC WITH DIFFERENTIAL/PLATELET
BASO%: 0.7 % (ref 0.0–2.0)
HCT: 40.7 % (ref 34.8–46.6)
MCHC: 33.6 g/dL (ref 31.5–36.0)
MONO#: 0.5 10*3/uL (ref 0.1–0.9)
NEUT%: 63.9 % (ref 38.4–76.8)
RBC: 4.36 10*6/uL (ref 3.70–5.45)
RDW: 15 % — ABNORMAL HIGH (ref 11.2–14.5)
WBC: 6.9 10*3/uL (ref 3.9–10.3)
lymph#: 1.8 10*3/uL (ref 0.9–3.3)

## 2012-10-13 LAB — UA PROTEIN, DIPSTICK - CHCC: Protein, ur: 100 mg/dL

## 2012-10-13 MED ORDER — SODIUM CHLORIDE 0.9 % IJ SOLN
10.0000 mL | INTRAMUSCULAR | Status: DC | PRN
  Filled 2012-10-13: qty 10

## 2012-10-13 MED ORDER — SODIUM CHLORIDE 0.9 % IV SOLN
Freq: Once | INTRAVENOUS | Status: AC
  Administered 2012-10-13: 15:00:00 via INTRAVENOUS

## 2012-10-13 MED ORDER — SODIUM CHLORIDE 0.9 % IV SOLN
15.0000 mg/kg | Freq: Once | INTRAVENOUS | Status: AC
  Administered 2012-10-13: 800 mg via INTRAVENOUS
  Filled 2012-10-13: qty 32

## 2012-10-13 MED ORDER — HEPARIN SOD (PORK) LOCK FLUSH 100 UNIT/ML IV SOLN
500.0000 [IU] | Freq: Once | INTRAVENOUS | Status: DC | PRN
  Filled 2012-10-13: qty 5

## 2012-10-13 MED ORDER — CYANOCOBALAMIN 1000 MCG/ML IJ SOLN
1000.0000 ug | Freq: Once | INTRAMUSCULAR | Status: AC
  Administered 2012-10-13: 1000 ug via INTRAMUSCULAR

## 2012-10-13 MED ORDER — SODIUM CHLORIDE 0.9 % IV SOLN
Freq: Once | INTRAVENOUS | Status: AC
  Administered 2012-10-13: 14:00:00 via INTRAVENOUS

## 2012-10-13 NOTE — Patient Instructions (Addendum)
Follow up in 3 weeks prior to your next cycle of maintenance Avastin 

## 2012-10-13 NOTE — Patient Instructions (Addendum)
[facility] Discharge Instructions for Patients Receiving Chemotherapy  Today you received the following chemotherapy agents: avastin   To help prevent nausea and vomiting after your treatment, we encourage you to take your nausea medication.  Take it as often as prescribed.     If you develop nausea and vomiting that is not controlled by your nausea medication, call the clinic. If it is after clinic hours your family physician or the after hours number for the clinic or go to the Emergency Department.   BELOW ARE SYMPTOMS THAT SHOULD BE REPORTED IMMEDIATELY:  *FEVER GREATER THAN 100.5 F  *CHILLS WITH OR WITHOUT FEVER  NAUSEA AND VOMITING THAT IS NOT CONTROLLED WITH YOUR NAUSEA MEDICATION  *UNUSUAL SHORTNESS OF BREATH  *UNUSUAL BRUISING OR BLEEDING  TENDERNESS IN MOUTH AND THROAT WITH OR WITHOUT PRESENCE OF ULCERS  *URINARY PROBLEMS  *BOWEL PROBLEMS  UNUSUAL RASH Items with * indicate a potential emergency and should be followed up as soon as possible.  Feel free to call the clinic you have any questions or concerns. The clinic phone number is [phone].   I have been informed and understand all the instructions given to me. I know to contact the clinic, my physician, or go to the Emergency Department if any problems should occur. I do not have any questions at this time, but understand that I may call the clinic during office hours   should I have any questions or need assistance in obtaining follow up care.    __________________________________________  _____________  __________ Signature of Patient or Authorized Representative            Date                   Time    __________________________________________ Nurse's Signature    

## 2012-10-13 NOTE — Progress Notes (Signed)
See separate consent form encounter for note regarding reconsent to updated version of ICF.  Patient seen and examined by Tiana Loft, PA today, prior to receiving maintenance treatment cycle 21. Patient feels her condition is improving. Based on lab results review and physical exam by Ms. Johnson, patient condition was deemed acceptable for treatment, pending results of UPC ratio, due to urine protein dipstick value of 2+ today.  10/13/2012 1:25pm UPC ratio results reviewed by Dr. Arbutus Ped and deemed acceptable for treatment per protocol parameters. Sign for infusion given to Raliegh Ip RN.

## 2012-10-15 NOTE — Progress Notes (Signed)
[facility] [phone]   Fax:([phone]  OFFICE PROGRESS NOTE  [name],[name], [name]  DIAGNOSIS:  #1 Recurrent non-small cell lung cancer consistent with adenocarcinoma. This was initially diagnosed as stage IB (T2a N0 M0) well-differentiated adenocarcinoma of bronchioalveolar type in December 2010.  #2 Acute pulmonary embolism in the right lower lobe lobar, segmental and subsegmental sized pulmonary arteries, diagnosed on 11/04/2011.   PRIOR THERAPY:  1.Status post right upper lobectomy on September 28, 2009 under the care of Dr. Edwyna Shell. The patient refused adjuvant chemotherapy at that time. She had disease recurrence in June of 2012.  2.Status post 4 cycles of systemic chemotherapy with carboplatin for an AUC of 6 and paclitaxel at 200 mg per meter squared and Avastin at 15 mg/kg according to the ECOG protocol #5508.   CURRENT THERAPY: Chemotherapy with Avastin 15 mg/kg according to the ECOG protocol #5508 status post 20 cycles.   INTERVAL HISTORY: [name] 72 y.o. female returns to the clinic today for followup visit. The patient is feeling fine today with no specific complaints except for the persistent peripheral neuropathy which at this point continues to remain stable. She was evaluated by neurology and currently on treatment with vitamin B12 injection on monthly basis. She is tolerating her treatment with Avastin fairly well with no significant adverse effects. The patient denied having any significant nausea or vomiting. She denied having any significant weight loss or night sweats. She has no significant chest pain, shortness breath. She reports a hacking cough over the past few days, productive of a small amount of blood tinged secretions. She also has had some blood tinged nasal secretions. She denied fever or chills. She reports that the range of motion in both of her shoulders has started to decrease slightly. She  Is  having some pains in her neck and across her shoulders and plans to follow up with Dr. Anne Hahn about these symptoms. She continues to have some fatigue, although she had enough energy to drive out of town and visit relatives over the holidays.  MEDICAL HISTORY: Past Medical History  Diagnosis Date  . Lung cancer 03/08/2011    recurrent  . Hypertension   . Hypothyroidism   . Hypercholesterolemia   . Glaucoma(365)   . Hip pain 09/02/2011  . Foot pain 09/02/2011    ALLERGIES:  is allergic to amitriptyline; codeine; and sulfa antibiotics.  MEDICATIONS:  Current Outpatient Prescriptions  Medication Sig Dispense Refill  . amLODipine (NORVASC) 5 MG tablet Take 10 mg by mouth daily. Per [name], NP      . citalopram (CELEXA) 40 MG tablet Take 40 mg by mouth daily.        . clonazePAM (KLONOPIN) 0.5 MG tablet Take 0.5 mg by mouth 2 (two) times daily as needed. Per [name], NP      . cyanocobalamin (,VITAMIN B-12,) 1000 MCG/ML injection Inject 1,000 mcg into the muscle every 30 (thirty) days.      . dorzolamide-timolol (COSOPT) 22.3-6.8 MG/ML ophthalmic solution 2 drops 2 (two) times daily. Per [name], NP       . levothyroxine (SYNTHROID, LEVOTHROID) 50 MCG tablet Take 50 mcg by mouth 4 (four) times a week. Per [name], NP       . levothyroxine (SYNTHROID, LEVOTHROID) 75 MCG tablet Take 75 mcg by mouth 3 (three) times a week. Per [name], NP       . lidocaine-prilocaine (EMLA) cream Apply 1  application topically as needed.        Marland Kitchen lisinopril (PRINIVIL,ZESTRIL) 40 MG tablet Take 40 mg by mouth daily. Per [name], NP       . prochlorperazine (COMPAZINE) 10 MG tablet Take 10 mg by mouth every 6 (six) hours as needed.        . rosuvastatin (CRESTOR) 10 MG tablet Take 10 mg by mouth daily.          SURGICAL HISTORY:  Past Surgical History  Procedure Date  . Video assisted thoracoscopy 09/28/2009  . Thoracotomy 09/28/2009    mini  . Lung lobectomy 09/28/2009     right upper    REVIEW OF SYSTEMS:  Pertinent items are noted in HPI.   PHYSICAL EXAMINATION: General appearance: alert, cooperative and no distress Head: Normocephalic, without obvious abnormality, atraumatic Neck: no adenopathy Lymph nodes: Cervical, supraclavicular, and axillary nodes normal. Resp: clear to auscultation bilaterally Cardio: regular rate and rhythm, S1, S2 normal, no murmur, click, rub or gallop GI: soft, non-tender; bowel sounds normal; no masses,  no organomegaly Extremities: extremities normal, atraumatic, no cyanosis or edema  ECOG PERFORMANCE STATUS: 1 - Symptomatic but completely ambulatory  Blood pressure 152/75, pulse 18, temperature 98.2 F (36.8 C), temperature source Oral, resp. rate 82, height 5\' 4"  (1.626 m), weight 123 lb 3.2 oz (55.883 kg).  LABORATORY DATA: Lab Results  Component Value Date   WBC 6.9 10/13/2012   HGB 13.7 10/13/2012   HCT 40.7 10/13/2012   MCV 93.5 10/13/2012   PLT 163 10/13/2012      Chemistry      Component Value Date/Time   NA 141 10/13/2012 1114   NA 136 05/20/2012 1416   NA 142 03/08/2011 0754   K 3.8 10/13/2012 1114   K 4.4 05/20/2012 1416   K 4.2 03/08/2011 0754   CL 105 10/13/2012 1114   CL 99 05/20/2012 1416   CL 102 03/08/2011 0754   CO2 27 10/13/2012 1114   CO2 26 05/20/2012 1416   CO2 27 03/08/2011 0754   BUN 12.0 10/13/2012 1114   BUN 6 05/20/2012 1416   BUN 9 03/08/2011 0754   CREATININE 1.0 10/13/2012 1114   CREATININE 1.11* 05/20/2012 1416   CREATININE 1.0 03/08/2011 0754      Component Value Date/Time   CALCIUM 10.3 10/13/2012 1114   CALCIUM 10.3 05/20/2012 1416   CALCIUM 9.5 03/08/2011 0754   ALKPHOS 71 10/13/2012 1114   ALKPHOS 71 05/20/2012 1416   ALKPHOS 59 03/08/2011 0754   AST 19 10/13/2012 1114   AST 9 05/20/2012 1416   AST 19 03/08/2011 0754   ALT 24 10/13/2012 1114   ALT <5 05/20/2012 1416   BILITOT 0.58 10/13/2012 1114   BILITOT 0.7 05/20/2012 1416   BILITOT 0.50 03/08/2011 0754       RADIOGRAPHIC  STUDIES: Ct Chest W Contrast  08/28/2012  *RADIOLOGY REPORT*  Clinical Data: Restaging lung cancer  CT CHEST WITH CONTRAST  Technique:  Multidetector CT imaging of the chest was performed following the standard protocol during bolus administration of intravenous contrast.  Contrast: 80mL OMNIPAQUE IOHEXOL 300 MG/ML  SOLN  Comparison: None.  ---  RECIST 1.1  Target Lesions:  1.  Posterior right lower lobe ground glass nodule - 15 mm (series 5/image 42), unchanged  2.  Superior segment left lower lobe cavitary lesion - 44 mm (series 5/image 24), previously 43 mm  Non-target Lesions:  1. Scattered bilateral lung nodules/opacities - present  ---  Findings: Stable  postsurgical changes in the anterior right upper lobe.  Stable ground-glass opacity/architectural distortion in the left upper lobe (series 5/image 11).  4.1 x 4.4 cm cavitary lesion in the superior segment left lower lobe (series 5/image 24), previously 4.4 x 4.3 cm.  Stable 1.5 x 1.5 cm ground-glass nodule in the posterior right lower lobe (series 5/image 42).  Additional scattered ground-glass nodule/opacities bilaterally (for example, series 5/images 9, 33, and 42).  Calcified granuloma in the lingula (series 5/image 27).  Underlying moderate centrilobular emphysematous changes.  No pleural effusion or pneumothorax.  Visualized thyroid is mildly heterogeneous.  The heart is normal in size.  No pericardial effusion.  Coronary atherosclerosis.  Atherosclerotic calcifications of the aortic arch.  No suspicious mediastinal, hilar, or axillary lymphadenopathy.  Right chest port.  Visualized upper abdomen is notable for calcified granulomata in the liver and spleen.  Mild degenerative changes of the visualized thoracolumbar spine.  IMPRESSION: Postsurgical changes in the right upper lobe.  No interval change from prior CT.  Stable bilateral ground-glass opacities / nodules, as described above.  RECIST 1.1 as above.   Original Report Authenticated By: Charline Bills, M.D.    Mr Cervical Spine Wo Contrast  08/20/2012  This examination was performed at [facility] at [facility]. The interpretation will be provided by [facility]   Original Report Authenticated By: Marin Roberts, M.D.     ASSESSMENT/PLAN: This is a very pleasant 72 years old white female with history of recurrent non-small cell lung cancer, adenocarcinoma. She is currently on maintenance Avastin 15 mg/kg every 3 weeks. The patient is tolerating her treatment fairly well and she has no evidence for disease progression on her recent scan. The patient was discussed with Dr. Arbutus Ped. She'll proceed with her maintenance Avastin as scheduled. She will followup in 3 weeks with a repeat CBC differential, C. met and urine protein dipstick to as well as any pertinent research labs. She will continue with her monthly B12 injections. She'll be due for restaging CT of the chest on 10/29/2012 with a followup appointment as well as next maintenance Avastin on 11/04/2012. She is advised to keep a close eye on her respiratory symptoms and let us know should she develop fever oro worsening symptoms. She is encouraged to follow up with Dr. Anne Hahn.  Laural Benes, Armen Waring E, PA-C   All questions were answered. The patient knows to call the clinic with any problems, questions or concerns. We can certainly see the patient much sooner if necessary.  I spent 20 minutes counseling the patient face to face. The total time spent in the appointment was 30 minutes.

## 2012-10-27 ENCOUNTER — Other Ambulatory Visit: Payer: Self-pay | Admitting: *Deleted

## 2012-10-29 ENCOUNTER — Ambulatory Visit (HOSPITAL_COMMUNITY)
Admission: RE | Admit: 2012-10-29 | Discharge: 2012-10-29 | Disposition: A | Discharge status: Home / Self Care | Payer: Medicare Other | Source: Ambulatory Visit | Attending: Internal Medicine | Admitting: Internal Medicine

## 2012-10-29 DIAGNOSIS — R911 Solitary pulmonary nodule: Secondary | ICD-10-CM | POA: Insufficient documentation

## 2012-10-29 DIAGNOSIS — C3490 Malignant neoplasm of unspecified part of unspecified bronchus or lung: Secondary | ICD-10-CM

## 2012-10-29 DIAGNOSIS — J438 Other emphysema: Secondary | ICD-10-CM | POA: Insufficient documentation

## 2012-10-29 DIAGNOSIS — I2510 Atherosclerotic heart disease of native coronary artery without angina pectoris: Secondary | ICD-10-CM | POA: Insufficient documentation

## 2012-10-29 MED ORDER — IOHEXOL 300 MG/ML  SOLN
80.0000 mL | Freq: Once | INTRAMUSCULAR | Status: AC | PRN
  Administered 2012-10-29: 80 mL via INTRAVENOUS

## 2012-11-03 ENCOUNTER — Encounter: Payer: Self-pay | Admitting: *Deleted

## 2012-11-03 ENCOUNTER — Other Ambulatory Visit: Payer: Self-pay | Admitting: *Deleted

## 2012-11-03 ENCOUNTER — Ambulatory Visit (HOSPITAL_BASED_OUTPATIENT_CLINIC_OR_DEPARTMENT_OTHER): Payer: Medicare Other

## 2012-11-03 ENCOUNTER — Encounter: Payer: Self-pay | Admitting: Internal Medicine

## 2012-11-03 ENCOUNTER — Telehealth: Payer: Self-pay | Admitting: Internal Medicine

## 2012-11-03 ENCOUNTER — Telehealth: Payer: Self-pay | Admitting: Certified Registered Nurse Anesthetist

## 2012-11-03 ENCOUNTER — Ambulatory Visit (HOSPITAL_BASED_OUTPATIENT_CLINIC_OR_DEPARTMENT_OTHER): Payer: Medicare Other | Admitting: Internal Medicine

## 2012-11-03 ENCOUNTER — Other Ambulatory Visit (HOSPITAL_BASED_OUTPATIENT_CLINIC_OR_DEPARTMENT_OTHER): Payer: Medicare Other | Admitting: Lab

## 2012-11-03 ENCOUNTER — Ambulatory Visit: Payer: Self-pay | Admitting: Internal Medicine

## 2012-11-03 VITALS — BP 154/76 | HR 70 | Temp 96.5°F | Resp 20 | Ht 64.0 in | Wt 125.3 lb

## 2012-11-03 DIAGNOSIS — C3490 Malignant neoplasm of unspecified part of unspecified bronchus or lung: Secondary | ICD-10-CM

## 2012-11-03 DIAGNOSIS — C3410 Malignant neoplasm of upper lobe, unspecified bronchus or lung: Secondary | ICD-10-CM

## 2012-11-03 DIAGNOSIS — G609 Hereditary and idiopathic neuropathy, unspecified: Secondary | ICD-10-CM

## 2012-11-03 DIAGNOSIS — Z5112 Encounter for antineoplastic immunotherapy: Secondary | ICD-10-CM

## 2012-11-03 DIAGNOSIS — R5383 Other fatigue: Secondary | ICD-10-CM

## 2012-11-03 DIAGNOSIS — E538 Deficiency of other specified B group vitamins: Secondary | ICD-10-CM

## 2012-11-03 LAB — COMPREHENSIVE METABOLIC PANEL (CC13)
AST: 11 U/L (ref 5–34)
Albumin: 3.8 g/dL (ref 3.5–5.0)
Alkaline Phosphatase: 67 U/L (ref 40–150)
BUN: 11 mg/dL (ref 7.0–26.0)
Calcium: 9.2 mg/dL (ref 8.4–10.4)
Chloride: 103 mEq/L (ref 98–107)
Glucose: 85 mg/dl (ref 70–99)
Potassium: 3.8 mEq/L (ref 3.5–5.1)
Sodium: 141 mEq/L (ref 136–145)
Total Protein: 7.5 g/dL (ref 6.4–8.3)

## 2012-11-03 LAB — CBC WITH DIFFERENTIAL/PLATELET
Basophils Absolute: 0.1 10*3/uL (ref 0.0–0.1)
Eosinophils Absolute: 0.2 10*3/uL (ref 0.0–0.5)
HGB: 13.9 g/dL (ref 11.6–15.9)
MCV: 92.8 fL (ref 79.5–101.0)
MONO%: 6.7 % (ref 0.0–14.0)
NEUT#: 4.5 10*3/uL (ref 1.5–6.5)
RBC: 4.45 10*6/uL (ref 3.70–5.45)
RDW: 14.5 % (ref 11.2–14.5)
WBC: 7.1 10*3/uL (ref 3.9–10.3)
lymph#: 1.9 10*3/uL (ref 0.9–3.3)

## 2012-11-03 LAB — MAGNESIUM (CC13): Magnesium: 2.3 mg/dl (ref 1.5–2.5)

## 2012-11-03 LAB — PROTEIN / CREATININE RATIO, URINE: Protein Creatinine Ratio: 0.49 — ABNORMAL HIGH (ref <0.15)

## 2012-11-03 MED ORDER — CYANOCOBALAMIN 1000 MCG/ML IJ SOLN
1000.0000 ug | Freq: Once | INTRAMUSCULAR | Status: AC
  Administered 2012-11-03: 1000 ug via INTRAMUSCULAR

## 2012-11-03 MED ORDER — SODIUM CHLORIDE 0.9 % IV SOLN: Freq: Once | INTRAVENOUS | Status: DC

## 2012-11-03 MED ORDER — HEPARIN SOD (PORK) LOCK FLUSH 100 UNIT/ML IV SOLN
500.0000 [IU] | Freq: Once | INTRAVENOUS | Status: AC | PRN
  Administered 2012-11-03: 500 [IU]
  Filled 2012-11-03: qty 5

## 2012-11-03 MED ORDER — SODIUM CHLORIDE 0.9 % IV SOLN
15.0000 mg/kg | Freq: Once | INTRAVENOUS | Status: AC
  Administered 2012-11-03: 800 mg via INTRAVENOUS
  Filled 2012-11-03: qty 32

## 2012-11-03 NOTE — Telephone Encounter (Signed)
gv and printed appt schedule for pt for Feb..the patient aware °

## 2012-11-03 NOTE — Patient Instructions (Addendum)
[facility] Discharge Instructions for Patients Receiving Chemotherapy  Today you received the following chemotherapy agents avastin  To help prevent nausea and vomiting after your treatment, we encourage you to take your nausea medication  and take it as often as prescribed   If you develop nausea and vomiting that is not controlled by your nausea medication, call the clinic. If it is after clinic hours your family physician or the after hours number for the clinic or go to the Emergency Department.   BELOW ARE SYMPTOMS THAT SHOULD BE REPORTED IMMEDIATELY:  *FEVER GREATER THAN 100.5 F  *CHILLS WITH OR WITHOUT FEVER  NAUSEA AND VOMITING THAT IS NOT CONTROLLED WITH YOUR NAUSEA MEDICATION  *UNUSUAL SHORTNESS OF BREATH  *UNUSUAL BRUISING OR BLEEDING  TENDERNESS IN MOUTH AND THROAT WITH OR WITHOUT PRESENCE OF ULCERS  *URINARY PROBLEMS  *BOWEL PROBLEMS  UNUSUAL RASH Items with * indicate a potential emergency and should be followed up as soon as possible.  One of the nurses will contact you 24 hours after your treatment. Please let the nurse know about any problems that you may have experienced. Feel free to call the clinic you have any questions or concerns. The clinic phone number is [phone].   I have been informed and understand all the instructions given to me. I know to contact the clinic, my physician, or go to the Emergency Department if any problems should occur. I do not have any questions at this time, but understand that I may call the clinic during office hours   should I have any questions or need assistance in obtaining follow up care.    __________________________________________  _____________  __________ Signature of Patient or Authorized Representative            Date                   Time    __________________________________________ Nurse's Signature

## 2012-11-03 NOTE — Progress Notes (Signed)
[facility] [phone]   Fax:([phone]) [phone]  OFFICE PROGRESS NOTE  [name],[name], [name]  DIAGNOSIS:  #1 Recurrent non-small cell lung cancer consistent with adenocarcinoma. This was initially diagnosed as stage IB (T2a N0 M0) well-differentiated adenocarcinoma of bronchioalveolar type in December 2010.  #2 Acute pulmonary embolism in the right lower lobe lobar, segmental and subsegmental sized pulmonary arteries, diagnosed on 11/04/2011.   PRIOR THERAPY:  1.Status post right upper lobectomy on September 28, 2009 under the care of Dr. Edwyna Shell. The patient refused adjuvant chemotherapy at that time. She had disease recurrence in June of 2012.  2.Status post 4 cycles of systemic chemotherapy with carboplatin for an AUC of 6 and paclitaxel at 200 mg per meter squared and Avastin at 15 mg/kg according to the ECOG protocol #5508.   CURRENT THERAPY: Chemotherapy with Avastin 15 mg/kg according to the ECOG protocol #5508 status post 21 cycles.    INTERVAL HISTORY: [name] 72 y.o. female returns to the clinic today for followup visit. The patient is feeling fine today with no specific complaints except for her baseline peripheral neuropathy as well as mild fatigue. She denied having any significant chest pain, shortness breath, cough or hemoptysis. She denied having any significant weight loss or night sweats. She actually he gained around 10 pounds recently. The patient is tolerating her treatment with maintenance Avastin fairly well with no significant adverse effects. Specifically, she denied having any bleeding issues. She had repeat CT scan of the chest performed recently and she is here for evaluation and discussion of her scan results.  MEDICAL HISTORY: Past Medical History  Diagnosis Date  . Lung cancer 03/08/2011    recurrent  . Hypertension   . Hypothyroidism   . Hypercholesterolemia   . Glaucoma(365)   . Hip pain 09/02/2011  .  Foot pain 09/02/2011    ALLERGIES:  is allergic to amitriptyline; codeine; and sulfa antibiotics.  MEDICATIONS:  Current Outpatient Prescriptions  Medication Sig Dispense Refill  . amLODipine (NORVASC) 5 MG tablet Take 10 mg by mouth daily. Per [name], NP      . citalopram (CELEXA) 40 MG tablet Take 40 mg by mouth daily.        . clonazePAM (KLONOPIN) 0.5 MG tablet Take 0.5 mg by mouth 2 (two) times daily as needed. Per [name], NP      . cyanocobalamin (,VITAMIN B-12,) 1000 MCG/ML injection Inject 1,000 mcg into the muscle every 30 (thirty) days.      . dorzolamide-timolol (COSOPT) 22.3-6.8 MG/ML ophthalmic solution 2 drops 2 (two) times daily. Per [name], NP       . levothyroxine (SYNTHROID, LEVOTHROID) 50 MCG tablet Take 50 mcg by mouth 4 (four) times a week. Per [name], NP       . levothyroxine (SYNTHROID, LEVOTHROID) 75 MCG tablet Take 75 mcg by mouth 3 (three) times a week. Per [name], NP       . lidocaine-prilocaine (EMLA) cream Apply 1 application topically as needed.        Marland Kitchen lisinopril (PRINIVIL,ZESTRIL) 40 MG tablet Take 40 mg by mouth daily. Per [name], NP       . rosuvastatin (CRESTOR) 10 MG tablet Take 10 mg by mouth daily.        . prochlorperazine (COMPAZINE) 10 MG tablet Take 10 mg by mouth every 6 (six) hours as needed.          SURGICAL HISTORY:  Past Surgical History  Procedure Date  . Video assisted thoracoscopy 09/28/2009  . Thoracotomy 09/28/2009    mini  . Lung lobectomy 09/28/2009    right upper    REVIEW OF SYSTEMS:  A comprehensive review of systems was negative except for: Constitutional: positive for fatigue Neurological: positive for paresthesia   PHYSICAL EXAMINATION: General appearance: alert, cooperative and no distress Head: Normocephalic, without obvious abnormality, atraumatic Neck: no adenopathy Lymph nodes: Cervical, supraclavicular, and axillary nodes normal. Resp: clear to auscultation  bilaterally Cardio: regular rate and rhythm, S1, S2 normal, no murmur, click, rub or gallop GI: soft, non-tender; bowel sounds normal; no masses,  no organomegaly Extremities: extremities normal, atraumatic, no cyanosis or edema Neurologic: Alert and oriented X 3, normal strength and tone. Normal symmetric reflexes. Normal coordination and gait  ECOG PERFORMANCE STATUS: 1 - Symptomatic but completely ambulatory  Blood pressure 154/76, pulse 70, temperature 96.5 F (35.8 C), temperature source Oral, resp. rate 20, height 5\' 4"  (1.626 m), weight 125 lb 4.8 oz (56.836 kg).  LABORATORY DATA: Lab Results  Component Value Date   WBC 7.1 11/03/2012   HGB 13.9 11/03/2012   HCT 41.3 11/03/2012   MCV 92.8 11/03/2012   PLT 153 11/03/2012      Chemistry      Component Value Date/Time   NA 141 11/03/2012 1048   NA 136 05/20/2012 1416   NA 142 03/08/2011 0754   K 3.8 11/03/2012 1048   K 4.4 05/20/2012 1416   K 4.2 03/08/2011 0754   CL 103 11/03/2012 1048   CL 99 05/20/2012 1416   CL 102 03/08/2011 0754   CO2 28 11/03/2012 1048   CO2 26 05/20/2012 1416   CO2 27 03/08/2011 0754   BUN 11.0 11/03/2012 1048   BUN 6 05/20/2012 1416   BUN 9 03/08/2011 0754   CREATININE 1.1 11/03/2012 1048   CREATININE 1.11* 05/20/2012 1416   CREATININE 1.0 03/08/2011 0754      Component Value Date/Time   CALCIUM 9.2 11/03/2012 1048   CALCIUM 10.3 05/20/2012 1416   CALCIUM 9.5 03/08/2011 0754   ALKPHOS 67 11/03/2012 1048   ALKPHOS 71 05/20/2012 1416   ALKPHOS 59 03/08/2011 0754   AST 11 11/03/2012 1048   AST 9 05/20/2012 1416   AST 19 03/08/2011 0754   ALT 11 11/03/2012 1048   ALT <5 05/20/2012 1416   BILITOT 0.71 11/03/2012 1048   BILITOT 0.7 05/20/2012 1416   BILITOT 0.50 03/08/2011 0754       RADIOGRAPHIC STUDIES: Ct Chest W Contrast  10/29/2012  *RADIOLOGY REPORT*  Clinical Data: Lung cancer.  Restaging scan.  RECIST protocol.  CT CHEST WITH CONTRAST  Technique:  Multidetector CT imaging of the chest was performed following the  standard protocol during bolus administration of intravenous contrast.  Contrast: 80mL OMNIPAQUE IOHEXOL 300 MG/ML  SOLN  Comparison: CT of the chest 08/28/2012.  Findings:  RECIST Protocol 1.1 Report:  Protocol 1.1 Lesions:  1. Posterior right lower lobe ground-glass attenuation nodule - no significant change measuring 1.5 x 1.5 cm on today's examination (image 42 of series 5).  2. Superior segment left lower lobe subsolid partially cavitary nodule - slightly larger than the prior examination measuring 5.1 x 4.4 cm on today's examination (image 22 of series 5), as compared with 4.4 x 4.1 cm on the prior study.  Non-target Lesions:  1. Multiple other scattered bilateral lung nodules and opacities - persistent (discussed below)  Mediastinum:  Right internal jugular single lumen  Port-A-Cath with tip terminating in the superior cavoatrial junction. Heart size is normal. There is no significant pericardial fluid, thickening or pericardial calcification. There is atherosclerosis of the thoracic aorta, the great vessels of the mediastinum and the coronary arteries, including calcified atherosclerotic plaque in the left main, left anterior descending, left circumflex and right coronary arteries. No pathologically enlarged mediastinal or hilar lymph nodes. Numerous calcified mediastinal and left hilar lymph nodes, likely sequelae of old granulomatous disease.  Lungs/Pleura: In addition to the RECIST lesions discussed above there are multiple additional opacities scattered throughout the lungs bilaterally.  These generally appear similar to the prior study, with the most notable area being a focal area of ground- glass attenuation, architectural distortion and mild cylindrical bronchiectasis in the superior aspect of the left upper lobe (image 9 of series 5).  There is also a prominent focus of ground-glass attenuation in the medial aspect of the right lower lobe on image 32 of series 5 which is unchanged.  Large calcified  granuloma left upper lobe.  Status post right upper lobectomy.  Similar mild postoperative scarring in the anterior aspect of the right hemithorax.  Mild diffuse bronchial wall thickening with background of mild centrilobular emphysema.  No pleural effusions.  Upper Abdomen: Numerous calcified granulomas throughout the liver and spleen.  Musculoskeletal: There are no aggressive appearing lytic or blastic lesions noted in the visualized portions of the skeleton.  IMPRESSION: 1.  Slight interval enlargement of RECIST target lesion #2 in the superior segment of the left lower lobe.  This large subsolid partially cavitary mass-like opacity now measures 5.1 x 4.4 cm. 2.  Other RECIST target and non target lesions appears similar to prior examinations. 3.  Status post right upper lobectomy. 4.  Atherosclerosis, including left main and three-vessel coronary artery disease.    Assessment for potential risk factor modification, dietary therapy or pharmacologic therapy may be warranted, if clinically indicated. 5.  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 6.  Mild diffuse bronchial wall thickening with mild central lobular emphysema; imaging findings compatible with underlying COPD.   Original Report Authenticated By: Trudie Reed, M.D.     ASSESSMENT: This is a very pleasant 72 years old African American female with metastatic non-small cell lung cancer, adenocarcinoma currently on maintenance treatment with Avastin is status post 21 cycles. The patient is doing fine but she had slight interval enlargement of the target lesion in the superior segment of the left lower lobe but it did not meet criteria for RECIST disease progression.  PLAN: I discussed the scan results with the patient today. I recommended for her to continue her treatment with Avastin with the same dose. She would come back for followup visit in 3 weeks with the next cycle of her chemotherapy.  She was advised to call immediately if she has  any concerning symptoms in the interval.  All questions were answered. The patient knows to call the clinic with any problems, questions or concerns. We can certainly see the patient much sooner if necessary.  I spent 15 minutes counseling the patient face to face. The total time spent in the appointment was 25 minutes.

## 2012-11-03 NOTE — Patient Instructions (Signed)
There is slight increase in the right lower lobe lung nodule but did not meet criteria for disease progression. We will continue treatment with Avastin. Followup in 3 weeks.

## 2012-11-03 NOTE — Progress Notes (Signed)
11/03/12 [id number], Maintenance, cycle 22. [name] is in the cancer center today for lab work, physical exam, and treatment on the E5508 clinical trial. She was seen by Dr. Arbutus Ped.  Urine protein is 2+. Urine sent for  STAT UPC ratio. Other lab results;ts WNL for treatment.   11/03/12 1:15. Result of UPC ratio - 0.49. Reviewed by Dr. Arbutus Ped. Patientcleared for treatment by MD.  Information and sign for infusion given to D. Okey Dupre, Charity fundraiser.

## 2012-11-04 ENCOUNTER — Ambulatory Visit: Payer: Medicare Other | Admitting: Internal Medicine

## 2012-11-05 ENCOUNTER — Other Ambulatory Visit: Payer: Self-pay | Admitting: Family Medicine

## 2012-11-12 ENCOUNTER — Other Ambulatory Visit: Payer: Self-pay | Admitting: *Deleted

## 2012-11-13 ENCOUNTER — Telehealth: Payer: Self-pay | Admitting: *Deleted

## 2012-11-13 ENCOUNTER — Telehealth: Payer: Self-pay | Admitting: Internal Medicine

## 2012-11-13 NOTE — Telephone Encounter (Signed)
Per staff message and POF I have scheduled appts.  JMW  

## 2012-11-13 NOTE — Telephone Encounter (Signed)
S/w the pt and she is aware of her tx to follow the md appt in feb.

## 2012-11-17 ENCOUNTER — Telehealth: Payer: Self-pay | Admitting: Internal Medicine

## 2012-11-17 ENCOUNTER — Ambulatory Visit
Admission: RE | Admit: 2012-11-17 | Discharge: 2012-11-17 | Disposition: A | Discharge status: Home / Self Care | Payer: Medicare Other | Source: Ambulatory Visit | Attending: Family Medicine | Admitting: Family Medicine

## 2012-11-17 NOTE — Telephone Encounter (Signed)
Pt called says she got a letter from her insurance company stating they will no longer pay. I asked pt to hold while I s/w Thelma Barge (Artist). Lanora Manis advised me to tell the pt to disregard the letter. Told pt to disregard the notice per Thelma Barge and confirmed upcoming appt.

## 2012-11-19 ENCOUNTER — Other Ambulatory Visit: Payer: Self-pay | Admitting: *Deleted

## 2012-11-19 DIAGNOSIS — C3490 Malignant neoplasm of unspecified part of unspecified bronchus or lung: Secondary | ICD-10-CM

## 2012-11-24 ENCOUNTER — Ambulatory Visit (HOSPITAL_BASED_OUTPATIENT_CLINIC_OR_DEPARTMENT_OTHER): Payer: Medicare Other | Admitting: Physician Assistant

## 2012-11-24 ENCOUNTER — Encounter: Payer: Self-pay | Admitting: Internal Medicine

## 2012-11-24 ENCOUNTER — Telehealth: Payer: Self-pay | Admitting: *Deleted

## 2012-11-24 ENCOUNTER — Ambulatory Visit (HOSPITAL_BASED_OUTPATIENT_CLINIC_OR_DEPARTMENT_OTHER): Payer: Medicare Other

## 2012-11-24 ENCOUNTER — Encounter: Payer: Medicare Other | Admitting: *Deleted

## 2012-11-24 ENCOUNTER — Other Ambulatory Visit (HOSPITAL_BASED_OUTPATIENT_CLINIC_OR_DEPARTMENT_OTHER): Payer: Medicare Other | Admitting: Lab

## 2012-11-24 ENCOUNTER — Encounter: Payer: Self-pay | Admitting: Physician Assistant

## 2012-11-24 ENCOUNTER — Telehealth: Payer: Self-pay | Admitting: Internal Medicine

## 2012-11-24 VITALS — BP 159/67 | HR 72 | Temp 97.7°F | Resp 18 | Ht 64.0 in | Wt 127.7 lb

## 2012-11-24 VITALS — BP 146/78 | HR 78

## 2012-11-24 DIAGNOSIS — Z85118 Personal history of other malignant neoplasm of bronchus and lung: Secondary | ICD-10-CM

## 2012-11-24 DIAGNOSIS — Z5112 Encounter for antineoplastic immunotherapy: Secondary | ICD-10-CM

## 2012-11-24 DIAGNOSIS — Z86711 Personal history of pulmonary embolism: Secondary | ICD-10-CM

## 2012-11-24 DIAGNOSIS — C3430 Malignant neoplasm of lower lobe, unspecified bronchus or lung: Secondary | ICD-10-CM

## 2012-11-24 DIAGNOSIS — C3490 Malignant neoplasm of unspecified part of unspecified bronchus or lung: Secondary | ICD-10-CM

## 2012-11-24 LAB — COMPREHENSIVE METABOLIC PANEL (CC13)
ALT: 11 U/L (ref 0–55)
Albumin: 3.8 g/dL (ref 3.5–5.0)
CO2: 30 mEq/L — ABNORMAL HIGH (ref 22–29)
Calcium: 10.2 mg/dL (ref 8.4–10.4)
Chloride: 103 mEq/L (ref 98–107)
Glucose: 78 mg/dl (ref 70–99)
Potassium: 4 mEq/L (ref 3.5–5.1)
Sodium: 141 mEq/L (ref 136–145)
Total Protein: 7.3 g/dL (ref 6.4–8.3)

## 2012-11-24 LAB — LACTATE DEHYDROGENASE (CC13): LDH: 181 U/L (ref 125–245)

## 2012-11-24 LAB — UA PROTEIN, DIPSTICK - CHCC: Protein, ur: 30 mg/dL

## 2012-11-24 LAB — CBC WITH DIFFERENTIAL/PLATELET
Basophils Absolute: 0.1 10*3/uL (ref 0.0–0.1)
Eosinophils Absolute: 0.2 10*3/uL (ref 0.0–0.5)
HGB: 13.7 g/dL (ref 11.6–15.9)
MCV: 91.7 fL (ref 79.5–101.0)
MONO#: 0.6 10*3/uL (ref 0.1–0.9)
MONO%: 7.1 % (ref 0.0–14.0)
NEUT#: 5 10*3/uL (ref 1.5–6.5)
RBC: 4.37 10*6/uL (ref 3.70–5.45)
RDW: 14.2 % (ref 11.2–14.5)
WBC: 8.1 10*3/uL (ref 3.9–10.3)

## 2012-11-24 LAB — MAGNESIUM (CC13): Magnesium: 2.2 mg/dl (ref 1.5–2.5)

## 2012-11-24 MED ORDER — SODIUM CHLORIDE 0.9 % IV SOLN
Freq: Once | INTRAVENOUS | Status: AC
  Administered 2012-11-24: 16:00:00 via INTRAVENOUS

## 2012-11-24 MED ORDER — SODIUM CHLORIDE 0.9 % IV SOLN
15.0000 mg/kg | Freq: Once | INTRAVENOUS | Status: AC
  Administered 2012-11-24: 800 mg via INTRAVENOUS
  Filled 2012-11-24: qty 32

## 2012-11-24 MED ORDER — SODIUM CHLORIDE 0.9 % IV SOLN
Freq: Once | INTRAVENOUS | Status: AC
  Administered 2012-11-24: 15:00:00 via INTRAVENOUS

## 2012-11-24 MED ORDER — SODIUM CHLORIDE 0.9 % IJ SOLN
10.0000 mL | INTRAMUSCULAR | Status: DC | PRN
  Administered 2012-11-24: 10 mL
  Filled 2012-11-24: qty 10

## 2012-11-24 MED ORDER — HEPARIN SOD (PORK) LOCK FLUSH 100 UNIT/ML IV SOLN
500.0000 [IU] | Freq: Once | INTRAVENOUS | Status: AC | PRN
  Administered 2012-11-24: 500 [IU]
  Filled 2012-11-24: qty 5

## 2012-11-24 NOTE — Patient Instructions (Addendum)
Followup in 3 weeks prior to your next scheduled cycle of maintenance Avastin

## 2012-11-24 NOTE — Patient Instructions (Addendum)
[facility] Discharge Instructions for Patients Receiving Chemotherapy  Today you received the following chemotherapy agents Avastin  To help prevent nausea and vomiting after your treatment, we encourage you to take your nausea medication as directed   If you develop nausea and vomiting that is not controlled by your nausea medication, call the clinic. If it is after clinic hours your family physician or the after hours number for the clinic or go to the Emergency Department.   BELOW ARE SYMPTOMS THAT SHOULD BE REPORTED IMMEDIATELY:  *FEVER GREATER THAN 100.5 F  *CHILLS WITH OR WITHOUT FEVER  NAUSEA AND VOMITING THAT IS NOT CONTROLLED WITH YOUR NAUSEA MEDICATION  *UNUSUAL SHORTNESS OF BREATH  *UNUSUAL BRUISING OR BLEEDING  TENDERNESS IN MOUTH AND THROAT WITH OR WITHOUT PRESENCE OF ULCERS  *URINARY PROBLEMS  *BOWEL PROBLEMS  UNUSUAL RASH Items with * indicate a potential emergency and should be followed up as soon as possible.  One of the nurses will contact you 24 hours after your treatment. Please let the nurse know about any problems that you may have experienced. Feel free to call the clinic you have any questions or concerns. The clinic phone number is [phone].   I have been informed and understand all the instructions given to me. I know to contact the clinic, my physician, or go to the Emergency Department if any problems should occur. I do not have any questions at this time, but understand that I may call the clinic during office hours   should I have any questions or need assistance in obtaining follow up care.    __________________________________________  _____________  __________ Signature of Patient or Authorized Representative            Date                   Time    __________________________________________ Nurse's Signature    

## 2012-11-24 NOTE — Telephone Encounter (Signed)
Per staff message and POF I have scheduled appts.  JMW  

## 2012-11-24 NOTE — Progress Notes (Signed)
Patient seen and examined by Tiana Loft, PA today, prior to receiving maintenance treatment cycle 23. Patient feels that she is doing well overall. Based on lab results review and physical exam by Ms. Johnson, patient condition was deemed acceptable for treatment.  Sign for infusion given to Shellee Milo RN.

## 2012-11-25 NOTE — Progress Notes (Signed)
[facility] [phone]   [phone]  OFFICE PROGRESS NOTE  [name],[name], [name]  DIAGNOSIS:  #1 Recurrent non-small cell lung cancer consistent with adenocarcinoma. This was initially diagnosed as stage IB (T2a N0 M0) well-differentiated adenocarcinoma of bronchioalveolar type in December 2010.  #2 Acute pulmonary embolism in the right lower lobe lobar, segmental and subsegmental sized pulmonary arteries, diagnosed on 11/04/2011.   PRIOR THERAPY:  1.Status post right upper lobectomy on September 28, 2009 under the care of Dr. Edwyna Shell. The patient refused adjuvant chemotherapy at that time. She had disease recurrence in June of 2012.  2.Status post 4 cycles of systemic chemotherapy with carboplatin for an AUC of 6 and paclitaxel at 200 mg per meter squared and Avastin at 15 mg/kg according to the ECOG protocol #5508.   CURRENT THERAPY: Chemotherapy with Avastin 15 mg/kg according to the ECOG protocol #5508 status post 22 cycles.    INTERVAL HISTORY: GREYDIS STLOUIS 72 y.o. female returns to the clinic today for followup visit. The patient is feeling fine today with no specific complaints except for her baseline peripheral neuropathy as well as mild fatigue. She denied having any significant chest pain, shortness breath, cough or hemoptysis. She denied having any significant weight loss or night sweats.  The patient is tolerating her treatment with maintenance Avastin fairly well with no significant adverse effects. Specifically, she denied having any bleeding issues. She states that her cholesterol remains elevated she continues on Crestor as prescribed by her primary care physician.  MEDICAL HISTORY: Past Medical History  Diagnosis Date  . Lung cancer 03/08/2011    recurrent  . Hypertension   . Hypothyroidism   . Hypercholesterolemia   . Glaucoma(365)   . Hip pain 09/02/2011  . Foot pain 09/02/2011    ALLERGIES:  is allergic  to amitriptyline; codeine; and sulfa antibiotics.  MEDICATIONS:  Current Outpatient Prescriptions  Medication Sig Dispense Refill  . amLODipine (NORVASC) 5 MG tablet Take 10 mg by mouth daily. Per [name], NP      . citalopram (CELEXA) 40 MG tablet Take 40 mg by mouth daily.        . clonazePAM (KLONOPIN) 0.5 MG tablet Take 0.5 mg by mouth 2 (two) times daily as needed. Per [name], NP      . cyanocobalamin (,VITAMIN B-12,) 1000 MCG/ML injection Inject 1,000 mcg into the muscle every 30 (thirty) days.      . dorzolamide-timolol (COSOPT) 22.3-6.8 MG/ML ophthalmic solution 2 drops 2 (two) times daily. Per [name], NP       . levothyroxine (SYNTHROID, LEVOTHROID) 50 MCG tablet Take 50 mcg by mouth 4 (four) times a week. Per [name], NP       . levothyroxine (SYNTHROID, LEVOTHROID) 75 MCG tablet Take 75 mcg by mouth 3 (three) times a week. Per [name], NP       . lidocaine-prilocaine (EMLA) cream Apply 1 application topically as needed.        Marland Kitchen lisinopril (PRINIVIL,ZESTRIL) 40 MG tablet Take 40 mg by mouth daily. Per [name], NP       . prochlorperazine (COMPAZINE) 10 MG tablet Take 10 mg by mouth every 6 (six) hours as needed.        . rosuvastatin (CRESTOR) 10 MG tablet Take 10 mg by mouth daily.         No current facility-administered medications for this visit.    SURGICAL HISTORY:  Past  Surgical History  Procedure Laterality Date  . Video assisted thoracoscopy  09/28/2009  . Thoracotomy  09/28/2009    mini  . Lung lobectomy  09/28/2009    right upper    REVIEW OF SYSTEMS:  A comprehensive review of systems was negative except for: Constitutional: positive for fatigue Neurological: positive for paresthesia   PHYSICAL EXAMINATION: General appearance: alert, cooperative and no distress Head: Normocephalic, without obvious abnormality, atraumatic Neck: no adenopathy Lymph nodes: Cervical, supraclavicular, and axillary nodes normal. Resp: clear  to auscultation bilaterally Cardio: regular rate and rhythm, S1, S2 normal, no murmur, click, rub or gallop GI: soft, non-tender; bowel sounds normal; no masses,  no organomegaly Extremities: extremities normal, atraumatic, no cyanosis or edema Neurologic: Alert and oriented X 3, normal strength and tone. Normal symmetric reflexes. Normal coordination and gait  ECOG PERFORMANCE STATUS: 1 - Symptomatic but completely ambulatory  Blood pressure 159/67, pulse 72, temperature 97.7 F (36.5 C), temperature source Oral, resp. rate 18, height 5\' 4"  (1.626 m), weight 127 lb 11.2 oz (57.924 kg).  LABORATORY DATA: Lab Results  Component Value Date   WBC 8.1 11/24/2012   HGB 13.7 11/24/2012   HCT 40.1 11/24/2012   MCV 91.7 11/24/2012   PLT 141* 11/24/2012      Chemistry      Component Value Date/Time   NA 141 11/24/2012 1328   NA 136 05/20/2012 1416   NA 142 03/08/2011 0754   K 4.0 11/24/2012 1328   K 4.4 05/20/2012 1416   K 4.2 03/08/2011 0754   CL 103 11/24/2012 1328   CL 99 05/20/2012 1416   CL 102 03/08/2011 0754   CO2 30* 11/24/2012 1328   CO2 26 05/20/2012 1416   CO2 27 03/08/2011 0754   BUN 11.9 11/24/2012 1328   BUN 6 05/20/2012 1416   BUN 9 03/08/2011 0754   CREATININE 1.0 11/24/2012 1328   CREATININE 1.11* 05/20/2012 1416   CREATININE 1.0 03/08/2011 0754      Component Value Date/Time   CALCIUM 10.2 11/24/2012 1328   CALCIUM 10.3 05/20/2012 1416   CALCIUM 9.5 03/08/2011 0754   ALKPHOS 67 11/24/2012 1328   ALKPHOS 71 05/20/2012 1416   ALKPHOS 59 03/08/2011 0754   AST 12 11/24/2012 1328   AST 9 05/20/2012 1416   AST 19 03/08/2011 0754   ALT 11 11/24/2012 1328   ALT <5 05/20/2012 1416   BILITOT 0.57 11/24/2012 1328   BILITOT 0.7 05/20/2012 1416   BILITOT 0.50 03/08/2011 0754       RADIOGRAPHIC STUDIES: Ct Chest W Contrast  10/29/2012  *RADIOLOGY REPORT*  Clinical Data: Lung cancer.  Restaging scan.  RECIST protocol.  CT CHEST WITH CONTRAST  Technique:  Multidetector CT imaging of the chest was performed  following the standard protocol during bolus administration of intravenous contrast.  Contrast: 80mL OMNIPAQUE IOHEXOL 300 MG/ML  SOLN  Comparison: CT of the chest 08/28/2012.  Findings:  RECIST Protocol 1.1 Report:  Protocol 1.1 Lesions:  1. Posterior right lower lobe ground-glass attenuation nodule - no significant change measuring 1.5 x 1.5 cm on today's examination (image 42 of series 5).  2. Superior segment left lower lobe subsolid partially cavitary nodule - slightly larger than the prior examination measuring 5.1 x 4.4 cm on today's examination (image 22 of series 5), as compared with 4.4 x 4.1 cm on the prior study.  Non-target Lesions:  1. Multiple other scattered bilateral lung nodules and opacities - persistent (discussed below)  Mediastinum:  Right internal  jugular single lumen Port-A-Cath with tip terminating in the superior cavoatrial junction. Heart size is normal. There is no significant pericardial fluid, thickening or pericardial calcification. There is atherosclerosis of the thoracic aorta, the great vessels of the mediastinum and the coronary arteries, including calcified atherosclerotic plaque in the left main, left anterior descending, left circumflex and right coronary arteries. No pathologically enlarged mediastinal or hilar lymph nodes. Numerous calcified mediastinal and left hilar lymph nodes, likely sequelae of old granulomatous disease.  Lungs/Pleura: In addition to the RECIST lesions discussed above there are multiple additional opacities scattered throughout the lungs bilaterally.  These generally appear similar to the prior study, with the most notable area being a focal area of ground- glass attenuation, architectural distortion and mild cylindrical bronchiectasis in the superior aspect of the left upper lobe (image 9 of series 5).  There is also a prominent focus of ground-glass attenuation in the medial aspect of the right lower lobe on image 32 of series 5 which is unchanged.   Large calcified granuloma left upper lobe.  Status post right upper lobectomy.  Similar mild postoperative scarring in the anterior aspect of the right hemithorax.  Mild diffuse bronchial wall thickening with background of mild centrilobular emphysema.  No pleural effusions.  Upper Abdomen: Numerous calcified granulomas throughout the liver and spleen.  Musculoskeletal: There are no aggressive appearing lytic or blastic lesions noted in the visualized portions of the skeleton.  IMPRESSION: 1.  Slight interval enlargement of RECIST target lesion #2 in the superior segment of the left lower lobe.  This large subsolid partially cavitary mass-like opacity now measures 5.1 x 4.4 cm. 2.  Other RECIST target and non target lesions appears similar to prior examinations. 3.  Status post right upper lobectomy. 4.  Atherosclerosis, including left main and three-vessel coronary artery disease.    Assessment for potential risk factor modification, dietary therapy or pharmacologic therapy may be warranted, if clinically indicated. 5.  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 6.  Mild diffuse bronchial wall thickening with mild central lobular emphysema; imaging findings compatible with underlying COPD.   Original Report Authenticated By: Trudie Reed, M.D.     ASSESSMENT/PLAN: This is a very pleasant 72 years old African American female with metastatic non-small cell lung cancer, adenocarcinoma currently on maintenance treatment with Avastin is status post 21 cycles. The patient is doing fine but she had slight interval enlargement of the target lesion in the superior segment of the left lower lobe but it did not meet criteria for RECIST disease progression. The patient was discussed with Dr. Arbutus Ped. She'll proceed with her next scheduled cycle of maintenance Avastin. She'll return in 3 weeks with another CBC differential, C. met and urine protein dipstick prior to her next cycle of maintenance Avastin.  Craig Wisnewski,  Thula Stewart E, PA-C    She was advised to call immediately if she has any concerning symptoms in the interval.  All questions were answered. The patient knows to call the clinic with any problems, questions or concerns. We can certainly see the patient much sooner if necessary.  I spent 20  minutes counseling the patient face to face. The total time spent in the appointment was 30 minutes.

## 2012-12-15 ENCOUNTER — Other Ambulatory Visit: Payer: Medicare Other | Admitting: Lab

## 2012-12-15 ENCOUNTER — Telehealth: Payer: Self-pay | Admitting: Internal Medicine

## 2012-12-15 ENCOUNTER — Ambulatory Visit: Payer: Medicare Other | Admitting: Physician Assistant

## 2012-12-15 ENCOUNTER — Encounter: Payer: Medicare Other | Admitting: *Deleted

## 2012-12-15 ENCOUNTER — Ambulatory Visit: Payer: Medicare Other

## 2012-12-15 ENCOUNTER — Other Ambulatory Visit: Payer: Self-pay | Admitting: Internal Medicine

## 2012-12-15 ENCOUNTER — Other Ambulatory Visit: Payer: Self-pay | Admitting: *Deleted
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
## 2012-12-16 ENCOUNTER — Telehealth: Payer: Self-pay | Admitting: *Deleted

## 2012-12-16 ENCOUNTER — Encounter: Payer: Medicare Other | Admitting: *Deleted

## 2012-12-16 ENCOUNTER — Ambulatory Visit (HOSPITAL_BASED_OUTPATIENT_CLINIC_OR_DEPARTMENT_OTHER): Payer: Medicare Other | Admitting: Physician Assistant

## 2012-12-16 ENCOUNTER — Ambulatory Visit (HOSPITAL_BASED_OUTPATIENT_CLINIC_OR_DEPARTMENT_OTHER): Payer: Medicare Other

## 2012-12-16 ENCOUNTER — Other Ambulatory Visit (HOSPITAL_BASED_OUTPATIENT_CLINIC_OR_DEPARTMENT_OTHER): Payer: Medicare Other | Admitting: Lab

## 2012-12-16 ENCOUNTER — Telehealth: Payer: Self-pay | Admitting: Internal Medicine

## 2012-12-16 ENCOUNTER — Encounter: Payer: Self-pay | Admitting: Physician Assistant

## 2012-12-16 DIAGNOSIS — G609 Hereditary and idiopathic neuropathy, unspecified: Secondary | ICD-10-CM

## 2012-12-16 DIAGNOSIS — C3430 Malignant neoplasm of lower lobe, unspecified bronchus or lung: Secondary | ICD-10-CM

## 2012-12-16 DIAGNOSIS — Z5112 Encounter for antineoplastic immunotherapy: Secondary | ICD-10-CM

## 2012-12-16 DIAGNOSIS — R5381 Other malaise: Secondary | ICD-10-CM

## 2012-12-16 DIAGNOSIS — E538 Deficiency of other specified B group vitamins: Secondary | ICD-10-CM

## 2012-12-16 LAB — CBC WITH DIFFERENTIAL/PLATELET
Basophils Absolute: 0.1 10*3/uL (ref 0.0–0.1)
EOS%: 1.7 % (ref 0.0–7.0)
Eosinophils Absolute: 0.1 10*3/uL (ref 0.0–0.5)
HCT: 41.5 % (ref 34.8–46.6)
HGB: 13.7 g/dL (ref 11.6–15.9)
LYMPH%: 26.8 % (ref 14.0–49.7)
MCH: 30.5 pg (ref 25.1–34.0)
MCV: 92.2 fL (ref 79.5–101.0)
MONO%: 7.6 % (ref 0.0–14.0)
NEUT%: 63.1 % (ref 38.4–76.8)
Platelets: 141 10*3/uL — ABNORMAL LOW (ref 145–400)
RDW: 14.1 % (ref 11.2–14.5)

## 2012-12-16 LAB — COMPREHENSIVE METABOLIC PANEL (CC13)
ALT: 11 U/L (ref 0–55)
AST: 12 U/L (ref 5–34)
CO2: 29 mEq/L (ref 22–29)
Calcium: 10.3 mg/dL (ref 8.4–10.4)
Chloride: 104 mEq/L (ref 98–107)
Sodium: 142 mEq/L (ref 136–145)
Total Bilirubin: 0.48 mg/dL (ref 0.20–1.20)
Total Protein: 7.2 g/dL (ref 6.4–8.3)

## 2012-12-16 LAB — PROTEIN / CREATININE RATIO, URINE: Protein Creatinine Ratio: 0.79 — ABNORMAL HIGH (ref <0.15)

## 2012-12-16 LAB — MAGNESIUM (CC13): Magnesium: 2.3 mg/dl (ref 1.5–2.5)

## 2012-12-16 LAB — UA PROTEIN, DIPSTICK - CHCC: Protein, ur: 100 mg/dL

## 2012-12-16 MED ORDER — CYANOCOBALAMIN 1000 MCG/ML IJ SOLN
1000.0000 ug | Freq: Once | INTRAMUSCULAR | Status: AC
Start: 2012-12-16 — End: 2012-12-16
  Administered 2012-12-16: 1000 ug via INTRAMUSCULAR

## 2012-12-16 MED ORDER — SODIUM CHLORIDE 0.9 % IV SOLN
Freq: Once | INTRAVENOUS | Status: AC
  Administered 2012-12-16: 16:00:00 via INTRAVENOUS

## 2012-12-16 MED ORDER — SODIUM CHLORIDE 0.9 % IV SOLN
15.0000 mg/kg | Freq: Once | INTRAVENOUS | Status: AC
  Administered 2012-12-16: 875 mg via INTRAVENOUS
  Filled 2012-12-16: qty 35

## 2012-12-16 MED ORDER — SODIUM CHLORIDE 0.9 % IJ SOLN
10.0000 mL | INTRAMUSCULAR | Status: DC | PRN
  Administered 2012-12-16: 10 mL
  Filled 2012-12-16: qty 10

## 2012-12-16 MED ORDER — HEPARIN SOD (PORK) LOCK FLUSH 100 UNIT/ML IV SOLN
500.0000 [IU] | Freq: Once | INTRAVENOUS | Status: AC | PRN
  Administered 2012-12-16: 500 [IU]
  Filled 2012-12-16: qty 5

## 2012-12-16 NOTE — Patient Instructions (Addendum)
[facility] Discharge Instructions for Patients Receiving Chemotherapy  Today you received the following chemotherapy agents avastin  To help prevent nausea and vomiting after your treatment, we encourage you to take your nausea medication  and take it as often as prescribed   If you develop nausea and vomiting that is not controlled by your nausea medication, call the clinic. If it is after clinic hours your family physician or the after hours number for the clinic or go to the Emergency Department.   BELOW ARE SYMPTOMS THAT SHOULD BE REPORTED IMMEDIATELY:  *FEVER GREATER THAN 100.5 F  *CHILLS WITH OR WITHOUT FEVER  NAUSEA AND VOMITING THAT IS NOT CONTROLLED WITH YOUR NAUSEA MEDICATION  *UNUSUAL SHORTNESS OF BREATH  *UNUSUAL BRUISING OR BLEEDING  TENDERNESS IN MOUTH AND THROAT WITH OR WITHOUT PRESENCE OF ULCERS  *URINARY PROBLEMS  *BOWEL PROBLEMS  UNUSUAL RASH Items with * indicate a potential emergency and should be followed up as soon as possible.  One of the nurses will contact you 24 hours after your treatment. Please let the nurse know about any problems that you may have experienced. Feel free to call the clinic you have any questions or concerns. The clinic phone number is [phone].   I have been informed and understand all the instructions given to me. I know to contact the clinic, my physician, or go to the Emergency Department if any problems should occur. I do not have any questions at this time, but understand that I may call the clinic during office hours   should I have any questions or need assistance in obtaining follow up care.    __________________________________________  _____________  __________ Signature of Patient or Authorized Representative            Date                   Time    __________________________________________ Nurse's Signature    

## 2012-12-16 NOTE — Patient Instructions (Addendum)
Follow up with Dr. Arbutus Ped in 3 weeks with a restaging CT scan of your chest to re-evaluate your disease

## 2012-12-16 NOTE — Telephone Encounter (Signed)
Per staff message and POF I have scheduled appts.  JMW  
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
## 2012-12-17 ENCOUNTER — Other Ambulatory Visit: Payer: Self-pay | Admitting: Certified Registered Nurse Anesthetist

## 2012-12-20 NOTE — Progress Notes (Signed)
[facility] Telephone:(336) [phone]   Fax:([phone]  OFFICE PROGRESS NOTE  [name],[name], [name]  DIAGNOSIS:  #1 Recurrent non-small cell lung cancer consistent with adenocarcinoma. This was initially diagnosed as stage IB (T2a N0 M0) well-differentiated adenocarcinoma of bronchioalveolar type in December 2010.  #2 Acute pulmonary embolism in the right lower lobe lobar, segmental and subsegmental sized pulmonary arteries, diagnosed on 11/04/2011.   PRIOR THERAPY:  1.Status post right upper lobectomy on September 28, 2009 under the care of Dr. Edwyna Shell. The patient refused adjuvant chemotherapy at that time. She had disease recurrence in June of 2012.  2.Status post 4 cycles of systemic chemotherapy with carboplatin for an AUC of 6 and paclitaxel at 200 mg per meter squared and Avastin at 15 mg/kg according to the ECOG protocol #5508.   CURRENT THERAPY: Chemotherapy with Avastin 15 mg/kg according to the ECOG protocol #5508 status post 23 cycles.    INTERVAL HISTORY: [name] 72 y.o. female returns to the clinic today for followup visit. The patient is feeling fine today with no specific complaints except for her baseline peripheral neuropathy as well as mild fatigue. She denied having any significant chest pain, shortness breath, cough or hemoptysis. She denied having any significant weight loss or night sweats.  The patient is tolerating her treatment with maintenance Avastin fairly well with no significant adverse effects. Specifically, she denied having any bleeding issues.   MEDICAL HISTORY: Past Medical History  Diagnosis Date  . Lung cancer 03/08/2011    recurrent  . Hypertension   . Hypothyroidism   . Hypercholesterolemia   . Glaucoma(365)   . Hip pain 09/02/2011  . Foot pain 09/02/2011    ALLERGIES:  is allergic to amitriptyline; codeine; and sulfa antibiotics.  MEDICATIONS:  Current Outpatient Prescriptions  Medication Sig  Dispense Refill  . amLODipine (NORVASC) 5 MG tablet Take 10 mg by mouth daily. Per [name], NP      . citalopram (CELEXA) 40 MG tablet Take 40 mg by mouth daily.        . clonazePAM (KLONOPIN) 0.5 MG tablet Take 0.5 mg by mouth 2 (two) times daily as needed. Per [name], NP      . cyanocobalamin (,VITAMIN B-12,) 1000 MCG/ML injection Inject 1,000 mcg into the muscle every 30 (thirty) days.      . dorzolamide-timolol (COSOPT) 22.3-6.8 MG/ML ophthalmic solution 2 drops 2 (two) times daily. Per [name], NP       . levothyroxine (SYNTHROID, LEVOTHROID) 50 MCG tablet Take 50 mcg by mouth 4 (four) times a week. Per [name], NP       . levothyroxine (SYNTHROID, LEVOTHROID) 75 MCG tablet Take 75 mcg by mouth 3 (three) times a week. Per [name], NP       . lidocaine-prilocaine (EMLA) cream Apply 1 application topically as needed.        Marland Kitchen lisinopril (PRINIVIL,ZESTRIL) 40 MG tablet Take 40 mg by mouth daily. Per [name], NP       . prochlorperazine (COMPAZINE) 10 MG tablet Take 10 mg by mouth every 6 (six) hours as needed.        . rosuvastatin (CRESTOR) 10 MG tablet Take 10 mg by mouth daily.         No current facility-administered medications for this visit.    SURGICAL HISTORY:  Past Surgical History  Procedure Laterality Date  . Video assisted thoracoscopy  09/28/2009  . Thoracotomy  09/28/2009    mini  . Lung lobectomy  09/28/2009    right upper    REVIEW OF SYSTEMS:  A comprehensive review of systems was negative except for: Constitutional: positive for fatigue Neurological: positive for paresthesia   PHYSICAL EXAMINATION: General appearance: alert, cooperative and no distress Head: Normocephalic, without obvious abnormality, atraumatic Neck: no adenopathy Lymph nodes: Cervical, supraclavicular, and axillary nodes normal. Resp: clear to auscultation bilaterally Cardio: regular rate and rhythm, S1, S2 normal, no murmur, click, rub or gallop GI: soft,  non-tender; bowel sounds normal; no masses,  no organomegaly Extremities: extremities normal, atraumatic, no cyanosis or edema Neurologic: Alert and oriented X 3, normal strength and tone. Normal symmetric reflexes. Normal coordination and gait  ECOG PERFORMANCE STATUS: 1 - Symptomatic but completely ambulatory  Blood pressure 156/86, pulse 67, temperature 97.3 F (36.3 C), temperature source Oral, resp. rate 18, height 5\' 4"  (1.626 m), weight 128 lb 3.2 oz (58.151 kg).  LABORATORY DATA: Lab Results  Component Value Date   WBC 8.0 12/16/2012   HGB 13.7 12/16/2012   HCT 41.5 12/16/2012   MCV 92.2 12/16/2012   PLT 141* 12/16/2012      Chemistry      Component Value Date/Time   NA 142 12/16/2012 1340   NA 136 05/20/2012 1416   NA 142 03/08/2011 0754   K 4.0 12/16/2012 1340   K 4.4 05/20/2012 1416   K 4.2 03/08/2011 0754   CL 104 12/16/2012 1340   CL 99 05/20/2012 1416   CL 102 03/08/2011 0754   CO2 29 12/16/2012 1340   CO2 26 05/20/2012 1416   CO2 27 03/08/2011 0754   BUN 13.9 12/16/2012 1340   BUN 6 05/20/2012 1416   BUN 9 03/08/2011 0754   CREATININE 1.1 12/16/2012 1340   CREATININE 1.11* 05/20/2012 1416   CREATININE 1.0 03/08/2011 0754      Component Value Date/Time   CALCIUM 10.3 12/16/2012 1340   CALCIUM 10.3 05/20/2012 1416   CALCIUM 9.5 03/08/2011 0754   ALKPHOS 69 12/16/2012 1340   ALKPHOS 71 05/20/2012 1416   ALKPHOS 59 03/08/2011 0754   AST 12 12/16/2012 1340   AST 9 05/20/2012 1416   AST 19 03/08/2011 0754   ALT 11 12/16/2012 1340   ALT <5 05/20/2012 1416   BILITOT 0.48 12/16/2012 1340   BILITOT 0.7 05/20/2012 1416   BILITOT 0.50 03/08/2011 0754       RADIOGRAPHIC STUDIES: Ct Chest W Contrast  10/29/2012  *RADIOLOGY REPORT*  Clinical Data: Lung cancer.  Restaging scan.  RECIST protocol.  CT CHEST WITH CONTRAST  Technique:  Multidetector CT imaging of the chest was performed following the standard protocol during bolus administration of intravenous contrast.  Contrast: 80mL OMNIPAQUE IOHEXOL 300 MG/ML  SOLN   Comparison: CT of the chest 08/28/2012.  Findings:  RECIST Protocol 1.1 Report:  Protocol 1.1 Lesions:  1. Posterior right lower lobe ground-glass attenuation nodule - no significant change measuring 1.5 x 1.5 cm on today's examination (image 42 of series 5).  2. Superior segment left lower lobe subsolid partially cavitary nodule - slightly larger than the prior examination measuring 5.1 x 4.4 cm on today's examination (image 22 of series 5), as compared with 4.4 x 4.1 cm on the prior study.  Non-target Lesions:  1. Multiple other scattered bilateral lung nodules and opacities - persistent (discussed below)  Mediastinum:  Right internal jugular single lumen Port-A-Cath with tip terminating in the superior cavoatrial junction. Heart size is normal. There  is no significant pericardial fluid, thickening or pericardial calcification. There is atherosclerosis of the thoracic aorta, the great vessels of the mediastinum and the coronary arteries, including calcified atherosclerotic plaque in the left main, left anterior descending, left circumflex and right coronary arteries. No pathologically enlarged mediastinal or hilar lymph nodes. Numerous calcified mediastinal and left hilar lymph nodes, likely sequelae of old granulomatous disease.  Lungs/Pleura: In addition to the RECIST lesions discussed above there are multiple additional opacities scattered throughout the lungs bilaterally.  These generally appear similar to the prior study, with the most notable area being a focal area of ground- glass attenuation, architectural distortion and mild cylindrical bronchiectasis in the superior aspect of the left upper lobe (image 9 of series 5).  There is also a prominent focus of ground-glass attenuation in the medial aspect of the right lower lobe on image 32 of series 5 which is unchanged.  Large calcified granuloma left upper lobe.  Status post right upper lobectomy.  Similar mild postoperative scarring in the anterior aspect  of the right hemithorax.  Mild diffuse bronchial wall thickening with background of mild centrilobular emphysema.  No pleural effusions.  Upper Abdomen: Numerous calcified granulomas throughout the liver and spleen.  Musculoskeletal: There are no aggressive appearing lytic or blastic lesions noted in the visualized portions of the skeleton.  IMPRESSION: 1.  Slight interval enlargement of RECIST target lesion #2 in the superior segment of the left lower lobe.  This large subsolid partially cavitary mass-like opacity now measures 5.1 x 4.4 cm. 2.  Other RECIST target and non target lesions appears similar to prior examinations. 3.  Status post right upper lobectomy. 4.  Atherosclerosis, including left main and three-vessel coronary artery disease.    Assessment for potential risk factor modification, dietary therapy or pharmacologic therapy may be warranted, if clinically indicated. 5.  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 6.  Mild diffuse bronchial wall thickening with mild central lobular emphysema; imaging findings compatible with underlying COPD.   Original Report Authenticated By: Trudie Reed, M.D.     ASSESSMENT/PLAN: This is a very pleasant 72 years old African American female with metastatic non-small cell lung cancer, adenocarcinoma currently on maintenance treatment with Avastin is status post 23 cycles. The patient is doing fine but she had slight interval enlargement of the target lesion in the superior segment of the left lower lobe but it did not meet criteria for RECIST disease progression. The patient was discussed with Dr. Arbutus Ped. She'll proceed with her next scheduled cycle of maintenance Avastin. She'll followup with Dr. Arbutus Ped in 3 weeks with another CBC differential, C. met and urine protein dipstick prior to her next cycle of maintenance Avastin as well as a restaging CT scan of her chest with contrast to reevaluate her disease.Marland Kitchen  [name], Terri E, PA-C    She was  advised to call immediately if she has any concerning symptoms in the interval.  All questions were answered. The patient knows to call the clinic with any problems, questions or concerns. We can certainly see the patient much sooner if necessary.  I spent 20  minutes counseling the patient face to face. The total time spent in the appointment was 30 minutes.

## 2012-12-29 ENCOUNTER — Telehealth: Payer: Self-pay | Admitting: *Deleted

## 2012-12-29 NOTE — Telephone Encounter (Signed)
Received telephone call from patient this morning who expressed her frustration with her [facility] billing. She said she had been "on the phone with [facility] and the insurance company" and couldn't get any answers, so she called her research nurse. She was concerned that she is now receiving EOBs for items such as MD visits and CT scans that she had not been billed for before, in addition to the medications administered at [facility]. She reported that her EOB showed charges of $10,800 for medications on 11/24/2012, with a patient cost of $1033. Patient stated "I won't be able to continue receiving treatment if it is going to cost me this much".  Acknowledged patient's frustration with the system. Patient stated that she had talked to Lanora Manis Joanne Gavel) about her bills before and that Lanora Manis had been able to correct an error in a billing code previously. Suggested to patient that she bring her bills and EOBs to [facility] at her next appointment and to let the registration staff know that she needed to speak to Lavina to help sort out the billing issues.  Patient also stated that she "might not be able to have her scans on Thursday". The patient stated that she typically receives a letter notifying her of insurance approval for her scans, and she has not received a letter. She stated that she received a phone call within the past week from [place] at [phone] extension [phone], stating the amount she would owe for her scans ($177). However, Thayer Ohm told her he had not yet received approval for the scans and that she needed to call him back today for an update. She tried to call him this morning but was told that he did not come into work until 11am today and that she would need to call back later. Patient stated that she would try to call Thayer Ohm again later today. Discussed issues related to CT scan approval with research manager, Marilu Favre; details were forwarded for resolution.

## 2012-12-31 ENCOUNTER — Encounter (HOSPITAL_COMMUNITY): Payer: Self-pay

## 2012-12-31 ENCOUNTER — Ambulatory Visit (HOSPITAL_COMMUNITY)
Admission: RE | Admit: 2012-12-31 | Discharge: 2012-12-31 | Disposition: A | Discharge status: Home / Self Care | Payer: Medicare Other | Source: Ambulatory Visit | Attending: Physician Assistant | Admitting: Physician Assistant

## 2012-12-31 DIAGNOSIS — C3490 Malignant neoplasm of unspecified part of unspecified bronchus or lung: Secondary | ICD-10-CM | POA: Insufficient documentation

## 2012-12-31 DIAGNOSIS — Z85850 Personal history of malignant neoplasm of thyroid: Secondary | ICD-10-CM | POA: Insufficient documentation

## 2012-12-31 DIAGNOSIS — R911 Solitary pulmonary nodule: Secondary | ICD-10-CM | POA: Insufficient documentation

## 2012-12-31 MED ORDER — IOHEXOL 300 MG/ML  SOLN
80.0000 mL | Freq: Once | INTRAMUSCULAR | Status: AC | PRN
  Administered 2012-12-31: 80 mL via INTRAVENOUS

## 2013-01-06 ENCOUNTER — Encounter: Payer: Medicare Other | Admitting: *Deleted

## 2013-01-06 ENCOUNTER — Encounter: Payer: Self-pay | Admitting: Internal Medicine

## 2013-01-06 ENCOUNTER — Other Ambulatory Visit (HOSPITAL_BASED_OUTPATIENT_CLINIC_OR_DEPARTMENT_OTHER): Payer: Medicare Other | Admitting: Lab

## 2013-01-06 ENCOUNTER — Ambulatory Visit (HOSPITAL_BASED_OUTPATIENT_CLINIC_OR_DEPARTMENT_OTHER): Payer: Medicare Other | Admitting: Internal Medicine

## 2013-01-06 ENCOUNTER — Ambulatory Visit: Payer: Medicare Other

## 2013-01-06 ENCOUNTER — Telehealth: Payer: Self-pay | Admitting: Internal Medicine

## 2013-01-06 DIAGNOSIS — C3410 Malignant neoplasm of upper lobe, unspecified bronchus or lung: Secondary | ICD-10-CM

## 2013-01-06 LAB — COMPREHENSIVE METABOLIC PANEL (CC13)
Albumin: 3.6 g/dL (ref 3.5–5.0)
BUN: 9.9 mg/dL (ref 7.0–26.0)
Calcium: 10.4 mg/dL (ref 8.4–10.4)
Chloride: 103 mEq/L (ref 98–107)
Glucose: 82 mg/dl (ref 70–99)
Potassium: 3.9 mEq/L (ref 3.5–5.1)
Sodium: 141 mEq/L (ref 136–145)
Total Protein: 7.3 g/dL (ref 6.4–8.3)

## 2013-01-06 LAB — CBC WITH DIFFERENTIAL/PLATELET
Basophils Absolute: 0 10*3/uL (ref 0.0–0.1)
Eosinophils Absolute: 0.2 10*3/uL (ref 0.0–0.5)
HCT: 41.9 % (ref 34.8–46.6)
HGB: 13.9 g/dL (ref 11.6–15.9)
MCH: 30.2 pg (ref 25.1–34.0)
MONO#: 0.6 10*3/uL (ref 0.1–0.9)
NEUT#: 6.3 10*3/uL (ref 1.5–6.5)
NEUT%: 68.5 % (ref 38.4–76.8)
RDW: 14.1 % (ref 11.2–14.5)
WBC: 9.2 10*3/uL (ref 3.9–10.3)
lymph#: 2.1 10*3/uL (ref 0.9–3.3)

## 2013-01-06 LAB — PROTEIN / CREATININE RATIO, URINE: Creatinine, Urine: 121.6 mg/dL

## 2013-01-06 LAB — MAGNESIUM (CC13): Magnesium: 2.3 mg/dl (ref 1.5–2.5)

## 2013-01-06 NOTE — Progress Notes (Signed)
[facility] Telephone:(336) [phone]   Fax:([phone]  OFFICE PROGRESS NOTE  [name],[name], [name]  DIAGNOSIS:  #1 Recurrent non-small cell lung cancer consistent with adenocarcinoma. This was initially diagnosed as stage IB (T2a N0 M0) well-differentiated adenocarcinoma of bronchioalveolar type in December 2010.  #2 Acute pulmonary embolism in the right lower lobe lobar, segmental and subsegmental sized pulmonary arteries, diagnosed on 11/04/2011.   PRIOR THERAPY:  1.Status post right upper lobectomy on September 28, 2009 under the care of Dr. Edwyna Shell. The patient refused adjuvant chemotherapy at that time. She had disease recurrence in June of 2012.  2.Status post 4 cycles of systemic chemotherapy with carboplatin for an AUC of 6 and paclitaxel at 200 mg per meter squared and Avastin at 15 mg/kg according to the ECOG protocol #5508.   CURRENT THERAPY: Chemotherapy with Avastin 15 mg/kg according to the ECOG protocol #5508 status post 24 cycles.   INTERVAL HISTORY: [name] 71 y.o. female returns to the clinic today for routine followup visit. The patient is feeling fine today with no specific complaints except for the numbness in her lower extremities. She denied having any significant chest pain, shortness breath, cough or hemoptysis. She denied having any weight loss or night sweats. The patient had repeat CT scan of the chest performed recently and she is here for evaluation and discussion of her scan results.  MEDICAL HISTORY: Past Medical History  Diagnosis Date  . Lung cancer 03/08/2011    recurrent  . Hypertension   . Hypothyroidism   . Hypercholesterolemia   . Glaucoma(365)   . Hip pain 09/02/2011  . Foot pain 09/02/2011    ALLERGIES:  is allergic to amitriptyline; codeine; and sulfa antibiotics.  MEDICATIONS:  Current Outpatient Prescriptions  Medication Sig Dispense Refill  . amLODipine (NORVASC) 5 MG tablet Take 10 mg by  mouth daily. Per [name], NP      . citalopram (CELEXA) 40 MG tablet Take 40 mg by mouth daily.        . clonazePAM (KLONOPIN) 0.5 MG tablet Take 0.5 mg by mouth 2 (two) times daily as needed. Per [name], NP      . cyanocobalamin (,VITAMIN B-12,) 1000 MCG/ML injection Inject 1,000 mcg into the muscle every 30 (thirty) days.      . dorzolamide-timolol (COSOPT) 22.3-6.8 MG/ML ophthalmic solution 2 drops 2 (two) times daily. Per [name], NP       . levothyroxine (SYNTHROID, LEVOTHROID) 50 MCG tablet Take 50 mcg by mouth 4 (four) times a week. Per [name], NP       . levothyroxine (SYNTHROID, LEVOTHROID) 75 MCG tablet Take 75 mcg by mouth 3 (three) times a week. Per [name], NP       . lidocaine-prilocaine (EMLA) cream Apply 1 application topically as needed.        Marland Kitchen lisinopril (PRINIVIL,ZESTRIL) 40 MG tablet Take 40 mg by mouth daily. Per [name], NP       . prochlorperazine (COMPAZINE) 10 MG tablet Take 10 mg by mouth every 6 (six) hours as needed.        . rosuvastatin (CRESTOR) 10 MG tablet Take 10 mg by mouth daily.         No current facility-administered medications for this visit.    SURGICAL HISTORY:  Past Surgical History  Procedure Laterality Date  . Video assisted thoracoscopy  09/28/2009  . Thoracotomy  09/28/2009    mini  .  Lung lobectomy  09/28/2009    right upper    REVIEW OF SYSTEMS:  A comprehensive review of systems was negative except for: Constitutional: positive for fatigue Neurological: positive for paresthesia   PHYSICAL EXAMINATION: General appearance: alert, cooperative and no distress Head: Normocephalic, without obvious abnormality, atraumatic Neck: no adenopathy Lymph nodes: Cervical, supraclavicular, and axillary nodes normal. Resp: clear to auscultation bilaterally Cardio: regular rate and rhythm, S1, S2 normal, no murmur, click, rub or gallop GI: soft, non-tender; bowel sounds normal; no masses,  no  organomegaly Extremities: extremities normal, atraumatic, no cyanosis or edema Neurologic: Alert and oriented X 3, normal strength and tone. Normal symmetric reflexes. Normal coordination and gait  ECOG PERFORMANCE STATUS: 1 - Symptomatic but completely ambulatory  Blood pressure 153/72, pulse 66, temperature 97.5 F (36.4 C), temperature source Oral, resp. rate 17, height 5\' 4"  (1.626 m), weight 128 lb 14.4 oz (58.469 kg).  LABORATORY DATA: Lab Results  Component Value Date   WBC 9.2 01/06/2013   HGB 13.9 01/06/2013   HCT 41.9 01/06/2013   MCV 91.1 01/06/2013   PLT 163 01/06/2013      Chemistry      Component Value Date/Time   NA 142 12/16/2012 1340   NA 136 05/20/2012 1416   NA 142 03/08/2011 0754   K 4.0 12/16/2012 1340   K 4.4 05/20/2012 1416   K 4.2 03/08/2011 0754   CL 104 12/16/2012 1340   CL 99 05/20/2012 1416   CL 102 03/08/2011 0754   CO2 29 12/16/2012 1340   CO2 26 05/20/2012 1416   CO2 27 03/08/2011 0754   BUN 13.9 12/16/2012 1340   BUN 6 05/20/2012 1416   BUN 9 03/08/2011 0754   CREATININE 1.1 12/16/2012 1340   CREATININE 1.11* 05/20/2012 1416   CREATININE 1.0 03/08/2011 0754      Component Value Date/Time   CALCIUM 10.3 12/16/2012 1340   CALCIUM 10.3 05/20/2012 1416   CALCIUM 9.5 03/08/2011 0754   ALKPHOS 69 12/16/2012 1340   ALKPHOS 71 05/20/2012 1416   ALKPHOS 59 03/08/2011 0754   AST 12 12/16/2012 1340   AST 9 05/20/2012 1416   AST 19 03/08/2011 0754   ALT 11 12/16/2012 1340   ALT <5 05/20/2012 1416   BILITOT 0.48 12/16/2012 1340   BILITOT 0.7 05/20/2012 1416   BILITOT 0.50 03/08/2011 0754       RADIOGRAPHIC STUDIES: Ct Chest W Contrast  12/31/2012  *RADIOLOGY REPORT*  Clinical Data: Follow up lung cancer, chemotherapy ongoing, history of thyroid cancer  CT CHEST WITH CONTRAST  Technique:  Multidetector CT imaging of the chest was performed following the standard protocol during bolus administration of intravenous contrast.  Contrast: 80mL OMNIPAQUE IOHEXOL 300 MG/ML  SOLN  Comparison: 10/29/2012   ---  RECIST 1.1  Target Lesions:  1.  Posterior right lower lobe ground-glass nodule - measures 1.5 x 1.5 cm (series 5/image 42), unchanged  2.  Superior segment left lower lobe subsolid nodule - measures 5.1 x 4.5 cm (series 5/image 26), previously 4.4 x 5.1 cm, unchanged  Non-target Lesions:  1.  Multiple additional scattered pulmonary nodules/opacities bilaterally - present  ---  Findings: Status post right upper lobectomy.  Stable scarring in the anterior right upper hemithorax.  Scattered bilateral ground-glass opacities, including: --1.7 x 2.2 cm spiculated opacity with architectural distortion in the posterior left upper lobe (series 5/image 12) --5.1 x 4.5 cm subsolid opacity in the left lower lobe (series 5/image 26) --1.4 x 3.0 cm ground-glass  opacity in the medial right lower lobe (series 5/image 33) --1.5 x 1.5 cm ground-glass opacity in the posterior right lower lobe (series 5/image 42)  While exact measurements are difficult, the appearance is similar to the prior study.  Additional scattered in the ground-glass nodules/opacities bilaterally are also similar.  Calcified granuloma in the lingula (series 5/image 29).  Underlying moderate centrilobular emphysematous changes.  No pleural effusion or pneumothorax.  Visualized thyroid is mildly heterogeneous/nodular (series 2/image 7).  The heart is normal in size.  No pericardial effusion.  Coronary atherosclerosis.  Atherosclerotic calcifications of the aortic arch.  Right chest port.  No suspicious mediastinal, hilar, or axillary lymphadenopathy. Calcified left hilar nodes.  Visualized upper abdomen is notable for calcified granulomata in the liver and spleen. No adrenal glands are unremarkable.  Degenerative changes of the visualized thoracolumbar spine.  IMPRESSION: Status post right upper lobectomy.  Bilateral ground-glass nodules/opacities, as described above, grossly unchanged.  Underlying moderate emphysematous changes.  RECIST 1.1 measurements as  above.   Original Report Authenticated By: Charline Bills, M.D.     ASSESSMENT: This is a very pleasant 72 years old African American female was metastatic non-small cell lung cancer currently on maintenance treatment with Avastin status post 24 cycles. The patient is feeling fine and tolerating her treatment fairly well. She has no evidence for disease progression on his recent scan.   PLAN: I discussed the scan results with the patient today. I recommended for her to continue treatment with maintenance Avastin at the current dose. She would come back for followup visit in 3 weeks with the next cycle of her treatment. She was advised to call immediately if she has any concerning symptoms in the interval. All questions were answered. The patient knows to call the clinic with any problems, questions or concerns. We can certainly see the patient much sooner if necessary.  I spent 15 minutes counseling the patient face to face. The total time spent in the appointment was 25 minutes.

## 2013-01-06 NOTE — Telephone Encounter (Signed)
gv pt appt schedule for March thru May. Pt will return tomorrow for inf per Arline Asp in research. Inf not done today.

## 2013-01-06 NOTE — Patient Instructions (Signed)
No evidence for disease progression on his recent scan. Continue treatment with Avastin as scheduled. Followup in 3 weeks

## 2013-01-06 NOTE — Progress Notes (Signed)
01/06/2013 2:30 PM Patient in to clinic today for evaluation prior to receiving treatment cycle 25. Patient reports that she is feeling well, has improved strength in her legs and some improvement in her shoulder strength. She notes that she is gaining weight. Based on physical exam by Dr. Arbutus Ped, patient condition is acceptable for continued treatment. Awaiting results of serum chemistries and UPC ratio to confirm that treatment may continue. Due to wait for additional results today, patient has opted to return to clinic tomorrow for treatment. Appointments coordinated by scheduler, and infusion and pharmacy staff were alerted to change in treatment day.  4:03 PM Lab results including UPC ratio of 1.41, compared to previous values ranging from 0.46 to 0.79, were reviewed by Dr. Arbutus Ped. Per MD, patient condition is acceptable for continued treatment tomorrow.

## 2013-01-07 ENCOUNTER — Ambulatory Visit (HOSPITAL_BASED_OUTPATIENT_CLINIC_OR_DEPARTMENT_OTHER): Payer: Medicare Other

## 2013-01-07 ENCOUNTER — Encounter: Payer: Self-pay | Admitting: *Deleted

## 2013-01-07 DIAGNOSIS — C3430 Malignant neoplasm of lower lobe, unspecified bronchus or lung: Secondary | ICD-10-CM

## 2013-01-07 DIAGNOSIS — Z5112 Encounter for antineoplastic immunotherapy: Secondary | ICD-10-CM

## 2013-01-07 MED ORDER — HEPARIN SOD (PORK) LOCK FLUSH 100 UNIT/ML IV SOLN
500.0000 [IU] | Freq: Once | INTRAVENOUS | Status: AC | PRN
  Administered 2013-01-07: 500 [IU]
  Filled 2013-01-07: qty 5

## 2013-01-07 MED ORDER — SODIUM CHLORIDE 0.9 % IV SOLN
15.0000 mg/kg | Freq: Once | INTRAVENOUS | Status: AC
  Administered 2013-01-07: 875 mg via INTRAVENOUS
  Filled 2013-01-07: qty 35

## 2013-01-07 MED ORDER — SODIUM CHLORIDE 0.9 % IV SOLN
Freq: Once | INTRAVENOUS | Status: AC
  Administered 2013-01-07: 10:00:00 via INTRAVENOUS

## 2013-01-07 MED ORDER — SODIUM CHLORIDE 0.9 % IV SOLN
Freq: Once | INTRAVENOUS | Status: AC
  Administered 2013-01-07: 11:00:00 via INTRAVENOUS

## 2013-01-07 MED ORDER — SODIUM CHLORIDE 0.9 % IJ SOLN
10.0000 mL | INTRAMUSCULAR | Status: DC | PRN
  Administered 2013-01-07: 10 mL
  Filled 2013-01-07: qty 10

## 2013-01-07 NOTE — Progress Notes (Signed)
01/07/13 @09 :50, [facility] ECOG 5508, Maintenance Bevacizumab, Cycle 25 Infusion:  [name] into the [facility] for treatment with bevacizumab only today.  Spoke with Laroy Apple, RN in the infusion area about her treatment and her labs and assessment of 01/06/13.  Provided her with a sign with details of today's treatment.  Reminded RN to call if diastolic blood pressure was >100mg Hg, and to document the 50ml NS flush after bevacizumab.

## 2013-01-07 NOTE — Patient Instructions (Addendum)
[facility] Discharge Instructions for Patients Receiving Chemotherapy  Today you received the following chemotherapy agents Avastin.   If you develop nausea and vomiting that is not controlled by your nausea medication, call the clinic. If it is after clinic hours your family physician or the after hours number for the clinic or go to the Emergency Department.   BELOW ARE SYMPTOMS THAT SHOULD BE REPORTED IMMEDIATELY:  *FEVER GREATER THAN 100.5 F  *CHILLS WITH OR WITHOUT FEVER  NAUSEA AND VOMITING THAT IS NOT CONTROLLED WITH YOUR NAUSEA MEDICATION  *UNUSUAL SHORTNESS OF BREATH  *UNUSUAL BRUISING OR BLEEDING  TENDERNESS IN MOUTH AND THROAT WITH OR WITHOUT PRESENCE OF ULCERS  *URINARY PROBLEMS  *BOWEL PROBLEMS  UNUSUAL RASH Items with * indicate a potential emergency and should be followed up as soon as possible.  One of the nurses will contact you 24 hours after your treatment. Please let the nurse know about any problems that you may have experienced. Feel free to call the clinic you have any questions or concerns. The clinic phone number is [phone].  Bevacizumab injection What is this medicine? BEVACIZUMAB (be va SIZ yoo mab) is a chemotherapy drug. It targets a protein found in many cancer cell types, and halts cancer growth. This drug treats many cancers including non-small cell lung cancer, and colon or rectal cancer. It is usually given with other chemotherapy drugs. This medicine may be used for other purposes; ask your health care provider or pharmacist if you have questions. What should I tell my health care provider before I take this medicine? They need to know if you have any of these conditions: -blood clots -heart disease, including heart failure, heart attack, or chest pain (angina) -high blood pressure -infection (especially a virus infection such as chickenpox, cold sores, or herpes) -kidney disease -lung disease -prior chemotherapy with  doxorubicin, daunorubicin, epirubicin, or other anthracycline type chemotherapy agents -recent or ongoing radiation therapy -recent surgery -stroke -an unusual or allergic reaction to bevacizumab, hamster proteins, mouse proteins, other medicines, foods, dyes, or preservatives -pregnant or trying to get pregnant -breast-feeding How should I use this medicine? This medicine is for infusion into a vein. It is given by a health care professional in a hospital or clinic setting. Talk to your pediatrician regarding the use of this medicine in children. Special care may be needed. Overdosage: If you think you have taken too much of this medicine contact a poison control center or emergency room at once. NOTE: This medicine is only for you. Do not share this medicine with others. What if I miss a dose? It is important not to miss your dose. Call your doctor or health care professional if you are unable to keep an appointment. What may interact with this medicine? Interactions are not expected. This list may not describe all possible interactions. Give your health care provider a list of all the medicines, herbs, non-prescription drugs, or dietary supplements you use. Also tell them if you smoke, drink alcohol, or use illegal drugs. Some items may interact with your medicine. What should I watch for while using this medicine? Your condition will be monitored carefully while you are receiving this medicine. You will need important blood work and urine testing done while you are taking this medicine. During your treatment, let your health care professional know if you have any unusual symptoms, such as difficulty breathing. This medicine may rarely cause 'gastrointestinal perforation' (holes in the stomach, intestines or colon), a serious side  effect requiring surgery to repair. This medicine should be started at least 28 days following major surgery and the site of the surgery should be totally healed.  Check with your doctor before scheduling dental work or surgery while you are receiving this treatment. Talk to your doctor if you have recently had surgery or if you have a wound that has not healed. Do not become pregnant while taking this medicine. Women should inform their doctor if they wish to become pregnant or think they might be pregnant. There is a potential for serious side effects to an unborn child. Talk to your health care professional or pharmacist for more information. Do not breast-feed an infant while taking this medicine. This medicine has caused ovarian failure in some women. This medicine may interfere with the ability to have a child. You should talk to your doctor or health care professional if you are concerned about your fertility. What side effects may I notice from receiving this medicine? Side effects that you should report to your doctor or health care professional as soon as possible: -allergic reactions like skin rash, itching or hives, swelling of the face, lips, or tongue -signs of infection - fever or chills, cough, sore throat, pain or trouble passing urine -signs of decreased platelets or bleeding - bruising, pinpoint red spots on the skin, black, tarry stools, nosebleeds, blood in the urine -breathing problems -changes in vision -chest pain -confusion -jaw pain, especially after dental work -mouth sores -seizures -severe abdominal pain -severe headache -sudden numbness or weakness of the face, arm or leg -swelling of legs or ankles -symptoms of a stroke: change in mental awareness, inability to talk or move one side of the body (especially in patients with lung cancer) -trouble passing urine or change in the amount of urine -trouble speaking or understanding -trouble walking, dizziness, loss of balance or coordination Side effects that usually do not require medical attention (report to your doctor or health care professional if they continue or are  bothersome): -constipation -diarrhea -dry skin -headache -loss of appetite -nausea, vomiting This list may not describe all possible side effects. Call your doctor for medical advice about side effects. You may report side effects to FDA at 1-800-FDA-1088. Where should I keep my medicine? This drug is given in a hospital or clinic and will not be stored at home. NOTE: This sheet is a summary. It may not cover all possible information. If you have questions about this medicine, talk to your doctor, pharmacist, or health care provider.  2013, [employer]. (08/31/2010 4:25:37 PM)

## 2013-01-27 ENCOUNTER — Ambulatory Visit: Payer: Medicare Other

## 2013-01-27 ENCOUNTER — Ambulatory Visit (HOSPITAL_BASED_OUTPATIENT_CLINIC_OR_DEPARTMENT_OTHER): Payer: Medicare Other | Admitting: Physician Assistant

## 2013-01-27 ENCOUNTER — Encounter: Payer: Medicare Other | Admitting: *Deleted

## 2013-01-27 ENCOUNTER — Encounter: Payer: Self-pay | Admitting: Physician Assistant

## 2013-01-27 ENCOUNTER — Other Ambulatory Visit (HOSPITAL_BASED_OUTPATIENT_CLINIC_OR_DEPARTMENT_OTHER): Payer: Medicare Other | Admitting: Lab

## 2013-01-27 ENCOUNTER — Telehealth: Payer: Self-pay | Admitting: Internal Medicine

## 2013-01-27 DIAGNOSIS — C3490 Malignant neoplasm of unspecified part of unspecified bronchus or lung: Secondary | ICD-10-CM

## 2013-01-27 LAB — CBC WITH DIFFERENTIAL/PLATELET
BASO%: 0.5 % (ref 0.0–2.0)
Basophils Absolute: 0 10*3/uL (ref 0.0–0.1)
HCT: 41.8 % (ref 34.8–46.6)
HGB: 13.9 g/dL (ref 11.6–15.9)
LYMPH%: 25.7 % (ref 14.0–49.7)
MCH: 30.5 pg (ref 25.1–34.0)
MCHC: 33.4 g/dL (ref 31.5–36.0)
MONO#: 0.6 10*3/uL (ref 0.1–0.9)
NEUT%: 64.7 % (ref 38.4–76.8)
Platelets: 161 10*3/uL (ref 145–400)
WBC: 8.5 10*3/uL (ref 3.9–10.3)
lymph#: 2.2 10*3/uL (ref 0.9–3.3)

## 2013-01-27 LAB — COMPREHENSIVE METABOLIC PANEL (CC13)
BUN: 14.1 mg/dL (ref 7.0–26.0)
CO2: 27 mEq/L (ref 22–29)
Calcium: 9.9 mg/dL (ref 8.4–10.4)
Chloride: 105 mEq/L (ref 98–107)
Creatinine: 1 mg/dL (ref 0.6–1.1)
Total Bilirubin: 0.48 mg/dL (ref 0.20–1.20)

## 2013-01-27 LAB — LACTATE DEHYDROGENASE (CC13): LDH: 208 U/L (ref 125–245)

## 2013-01-27 LAB — UA PROTEIN, DIPSTICK - CHCC: Protein, ur: 100 mg/dL

## 2013-01-27 LAB — PROTEIN / CREATININE RATIO, URINE
Creatinine, Urine: 78 mg/dL
Protein Creatinine Ratio: 2.21 — ABNORMAL HIGH (ref <0.15)
Total Protein, Urine: 172 mg/dL

## 2013-01-27 LAB — MAGNESIUM (CC13): Magnesium: 2.2 mg/dl (ref 1.5–2.5)

## 2013-01-27 NOTE — Patient Instructions (Addendum)
Follow up with Dr. Arbutus Ped in 3 weeks prior to your next scheduled cycle of maintenance Avastin

## 2013-01-27 NOTE — Progress Notes (Signed)
01/27/2013 2:42 PM  Patient in to clinic this morning for evaluation prior to receiving bevacizumab maintenance treatment cycle 26. Patient states she is doing well overall. She reports that she is gaining weight and continuing to take 1-2 cans of Ensure per day. She reports that her neuropathy is essentially unchanged in her hands, and she feels that she has less discomfort in her feet now because her calluses have gone away. 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 Patient reports occasional episodes of mild dizziness, when reaching high overhead or when bending below the waist. Encouraged by PA to maintain adequate fluid intake, which patient reports she has not been doing consistently.   Due to urine protein dipstick result of 2+, urine sample sent for protein-creatinine ratio. Results were reviewed by Dr. Arbutus Ped, along with protocol dose modifications for proteinuria. Based on UPC ratio of 2.2 and protocol section 5.4.1.2 "UPC ratio must be < 3.5 for patients to receive bevacizumab treatment", patient will continue with treatment as planned. Note that treatment was rescheduled to tomorrow 4/17 due to the wait time for UPC ratio results, and per patient request. Patient notified that urine protein results remain within the moderate range, and that the results were reviewed by Dr. Arbutus Ped who states that treatment may continue as planned tomorrow.

## 2013-01-28 ENCOUNTER — Ambulatory Visit (HOSPITAL_BASED_OUTPATIENT_CLINIC_OR_DEPARTMENT_OTHER): Payer: Medicare Other

## 2013-01-28 DIAGNOSIS — E538 Deficiency of other specified B group vitamins: Secondary | ICD-10-CM

## 2013-01-28 DIAGNOSIS — C3410 Malignant neoplasm of upper lobe, unspecified bronchus or lung: Secondary | ICD-10-CM

## 2013-01-28 DIAGNOSIS — Z5112 Encounter for antineoplastic immunotherapy: Secondary | ICD-10-CM

## 2013-01-28 MED ORDER — SODIUM CHLORIDE 0.9 % IV SOLN
Freq: Once | INTRAVENOUS | Status: AC
  Administered 2013-01-28: 12:00:00 via INTRAVENOUS

## 2013-01-28 MED ORDER — CYANOCOBALAMIN 1000 MCG/ML IJ SOLN
1000.0000 ug | Freq: Once | INTRAMUSCULAR | Status: AC
  Administered 2013-01-28: 1000 ug via INTRAMUSCULAR

## 2013-01-28 MED ORDER — SODIUM CHLORIDE 0.9 % IV SOLN
Freq: Once | INTRAVENOUS | Status: AC
  Administered 2013-01-28: 13:00:00 via INTRAVENOUS

## 2013-01-28 MED ORDER — SODIUM CHLORIDE 0.9 % IJ SOLN
10.0000 mL | INTRAMUSCULAR | Status: DC | PRN
  Administered 2013-01-28: 10 mL
  Filled 2013-01-28: qty 10

## 2013-01-28 MED ORDER — SODIUM CHLORIDE 0.9 % IV SOLN
15.0000 mg/kg | Freq: Once | INTRAVENOUS | Status: AC
  Administered 2013-01-28: 875 mg via INTRAVENOUS
  Filled 2013-01-28: qty 35

## 2013-01-28 MED ORDER — HEPARIN SOD (PORK) LOCK FLUSH 100 UNIT/ML IV SOLN
500.0000 [IU] | Freq: Once | INTRAVENOUS | Status: AC | PRN
  Administered 2013-01-28: 500 [IU]
  Filled 2013-01-28: qty 5

## 2013-01-28 NOTE — Patient Instructions (Addendum)
[facility] Discharge Instructions for Patients Receiving Chemotherapy  Today you received the following chemotherapy agents:  alimta  To help prevent nausea and vomiting after your treatment, we encourage you to take your nausea medication    If you develop nausea and vomiting that is not controlled by your nausea medication, call the clinic. If it is after clinic hours your family physician or the after hours number for the clinic or go to the Emergency Department.   BELOW ARE SYMPTOMS THAT SHOULD BE REPORTED IMMEDIATELY:  *FEVER GREATER THAN 100.5 F  *CHILLS WITH OR WITHOUT FEVER  NAUSEA AND VOMITING THAT IS NOT CONTROLLED WITH YOUR NAUSEA MEDICATION  *UNUSUAL SHORTNESS OF BREATH  *UNUSUAL BRUISING OR BLEEDING  TENDERNESS IN MOUTH AND THROAT WITH OR WITHOUT PRESENCE OF ULCERS  *URINARY PROBLEMS  *BOWEL PROBLEMS  UNUSUAL RASH Items with * indicate a potential emergency and should be followed up as soon as possible.  Marland Kitchen

## 2013-01-28 NOTE — Progress Notes (Signed)
01/28/2013 11:36 AM  Patient in to clinic this morning for bevacizumab treatment. Patient treatment and toxicity history, including blood pressure elevations and proteinuria, reviewed with Sabino Snipes, RN. Sign for infusion given to Ms. [name].  Cindy S. Clelia Croft BSN, RN, [employer]

## 2013-02-01 NOTE — Progress Notes (Signed)
[facility] [phone]   [phone]  OFFICE PROGRESS NOTE  [name],[name], [name]  DIAGNOSIS:  #1 Recurrent non-small cell lung cancer consistent with adenocarcinoma. This was initially diagnosed as stage IB (T2a N0 M0) well-differentiated adenocarcinoma of bronchioalveolar type in December 2010.  #2 Acute pulmonary embolism in the right lower lobe lobar, segmental and subsegmental sized pulmonary arteries, diagnosed on 11/04/2011.   PRIOR THERAPY:  1.Status post right upper lobectomy on September 28, 2009 under the care of Dr. Edwyna Shell. The patient refused adjuvant chemotherapy at that time. She had disease recurrence in June of 2012.  2.Status post 4 cycles of systemic chemotherapy with carboplatin for an AUC of 6 and paclitaxel at 200 mg per meter squared and Avastin at 15 mg/kg according to the ECOG protocol #5508.   CURRENT THERAPY: Chemotherapy with Avastin 15 mg/kg according to the ECOG protocol #5508 status post 25 cycles.   INTERVAL HISTORY: [name] 72 y.o. female returns to the clinic today for routine followup visit. The patient is feeling fine today with no specific complaints except for the numbness in her lower extremities. She also reports a little dizziness with extreme range of motion such as when she's reaching apply for something or bending overall to the floor. She has not experienced any syncopal episodes as a result. She denied having any significant chest pain, shortness breath, cough or hemoptysis. She denied having any weight loss or night sweats.   MEDICAL HISTORY: Past Medical History  Diagnosis Date  . Lung cancer 03/08/2011    recurrent  . Hypertension   . Hypothyroidism   . Hypercholesterolemia   . Glaucoma(365)   . Hip pain 09/02/2011  . Foot pain 09/02/2011    ALLERGIES:  is allergic to amitriptyline; codeine; and sulfa antibiotics.  MEDICATIONS:  Current Outpatient Prescriptions    Medication Sig Dispense Refill  . amLODipine (NORVASC) 5 MG tablet Take 10 mg by mouth daily. Per [name], NP      . citalopram (CELEXA) 40 MG tablet Take 40 mg by mouth daily.        . clonazePAM (KLONOPIN) 0.5 MG tablet Take 0.5 mg by mouth 2 (two) times daily as needed. Per [name], NP      . cyanocobalamin (,VITAMIN B-12,) 1000 MCG/ML injection Inject 1,000 mcg into the muscle every 30 (thirty) days.      . dorzolamide-timolol (COSOPT) 22.3-6.8 MG/ML ophthalmic solution 2 drops 2 (two) times daily. Per [name], NP       . levothyroxine (SYNTHROID, LEVOTHROID) 50 MCG tablet Take 50 mcg by mouth 4 (four) times a week. Per [name], NP       . levothyroxine (SYNTHROID, LEVOTHROID) 75 MCG tablet Take 75 mcg by mouth 3 (three) times a week. Per [name], NP       . lidocaine-prilocaine (EMLA) cream Apply 1 application topically as needed.        Marland Kitchen lisinopril (PRINIVIL,ZESTRIL) 40 MG tablet Take 40 mg by mouth daily. Per [name], NP       . prochlorperazine (COMPAZINE) 10 MG tablet Take 10 mg by mouth every 6 (six) hours as needed.        . rosuvastatin (CRESTOR) 10 MG tablet Take 10 mg by mouth daily.         No current facility-administered medications for this visit.    SURGICAL HISTORY:  Past Surgical History  Procedure Laterality Date  . Video  assisted thoracoscopy  09/28/2009  . Thoracotomy  09/28/2009    mini  . Lung lobectomy  09/28/2009    right upper    REVIEW OF SYSTEMS:  A comprehensive review of systems was negative except for: Constitutional: positive for fatigue Neurological: positive for dizziness and paresthesia   PHYSICAL EXAMINATION: General appearance: alert, cooperative and no distress Head: Normocephalic, without obvious abnormality, atraumatic Neck: no adenopathy Lymph nodes: Cervical, supraclavicular, and axillary nodes normal. Resp: clear to auscultation bilaterally Cardio: regular rate and rhythm, S1, S2 normal, no murmur,  click, rub or gallop GI: soft, non-tender; bowel sounds normal; no masses,  no organomegaly Extremities: extremities normal, atraumatic, no cyanosis or edema Neurologic: Alert and oriented X 3, normal strength and tone. Normal symmetric reflexes. Normal coordination and gait  ECOG PERFORMANCE STATUS: 1 - Symptomatic but completely ambulatory  Blood pressure 158/82, pulse 63, temperature 96.9 F (36.1 C), temperature source Oral, resp. rate 18, height 5\' 4"  (1.626 m), weight 131 lb 3.2 oz (59.512 kg).  LABORATORY DATA: Lab Results  Component Value Date   WBC 8.5 01/27/2013   HGB 13.9 01/27/2013   HCT 41.8 01/27/2013   MCV 91.3 01/27/2013   PLT 161 01/27/2013      Chemistry      Component Value Date/Time   NA 142 01/27/2013 1159   NA 136 05/20/2012 1416   NA 142 03/08/2011 0754   K 3.8 01/27/2013 1159   K 4.4 05/20/2012 1416   K 4.2 03/08/2011 0754   CL 105 01/27/2013 1159   CL 99 05/20/2012 1416   CL 102 03/08/2011 0754   CO2 27 01/27/2013 1159   CO2 26 05/20/2012 1416   CO2 27 03/08/2011 0754   BUN 14.1 01/27/2013 1159   BUN 6 05/20/2012 1416   BUN 9 03/08/2011 0754   CREATININE 1.0 01/27/2013 1159   CREATININE 1.11* 05/20/2012 1416   CREATININE 1.0 03/08/2011 0754      Component Value Date/Time   CALCIUM 9.9 01/27/2013 1159   CALCIUM 10.3 05/20/2012 1416   CALCIUM 9.5 03/08/2011 0754   ALKPHOS 69 01/27/2013 1159   ALKPHOS 71 05/20/2012 1416   ALKPHOS 59 03/08/2011 0754   AST 15 01/27/2013 1159   AST 9 05/20/2012 1416   AST 19 03/08/2011 0754   ALT 11 01/27/2013 1159   ALT <5 05/20/2012 1416   BILITOT 0.48 01/27/2013 1159   BILITOT 0.7 05/20/2012 1416   BILITOT 0.50 03/08/2011 0754       RADIOGRAPHIC STUDIES: Ct Chest W Contrast  12/31/2012  *RADIOLOGY REPORT*  Clinical Data: Follow up lung cancer, chemotherapy ongoing, history of thyroid cancer  CT CHEST WITH CONTRAST  Technique:  Multidetector CT imaging of the chest was performed following the standard protocol during bolus administration of  intravenous contrast.  Contrast: 80mL OMNIPAQUE IOHEXOL 300 MG/ML  SOLN  Comparison: 10/29/2012  ---  RECIST 1.1  Target Lesions:  1.  Posterior right lower lobe ground-glass nodule - measures 1.5 x 1.5 cm (series 5/image 42), unchanged  2.  Superior segment left lower lobe subsolid nodule - measures 5.1 x 4.5 cm (series 5/image 26), previously 4.4 x 5.1 cm, unchanged  Non-target Lesions:  1.  Multiple additional scattered pulmonary nodules/opacities bilaterally - present  ---  Findings: Status post right upper lobectomy.  Stable scarring in the anterior right upper hemithorax.  Scattered bilateral ground-glass opacities, including: --1.7 x 2.2 cm spiculated opacity with architectural distortion in the posterior left upper lobe (series 5/image 12) --5.1 x  4.5 cm subsolid opacity in the left lower lobe (series 5/image 26) --1.4 x 3.0 cm ground-glass opacity in the medial right lower lobe (series 5/image 33) --1.5 x 1.5 cm ground-glass opacity in the posterior right lower lobe (series 5/image 42)  While exact measurements are difficult, the appearance is similar to the prior study.  Additional scattered in the ground-glass nodules/opacities bilaterally are also similar.  Calcified granuloma in the lingula (series 5/image 29).  Underlying moderate centrilobular emphysematous changes.  No pleural effusion or pneumothorax.  Visualized thyroid is mildly heterogeneous/nodular (series 2/image 7).  The heart is normal in size.  No pericardial effusion.  Coronary atherosclerosis.  Atherosclerotic calcifications of the aortic arch.  Right chest port.  No suspicious mediastinal, hilar, or axillary lymphadenopathy. Calcified left hilar nodes.  Visualized upper abdomen is notable for calcified granulomata in the liver and spleen. No adrenal glands are unremarkable.  Degenerative changes of the visualized thoracolumbar spine.  IMPRESSION: Status post right upper lobectomy.  Bilateral ground-glass nodules/opacities, as described  above, grossly unchanged.  Underlying moderate emphysematous changes.  RECIST 1.1 measurements as above.   Original Report Authenticated By: Charline Bills, M.D.     ASSESSMENT/PLAN: This is a very pleasant 72 years old African American female was metastatic non-small cell lung cancer currently on maintenance treatment with Avastin status post 24 cycles. The patient is feeling fine and tolerating her treatment fairly well. She has no evidence for disease progression on his recent scan. Patient was discussed Dr. Arbutus Ped. She will will proceed with her scheduled cycle of maintenance Avastin today. She will followup with Dr. Arbutus Ped in 3 weeks prior to her next scheduled cycle of maintenance Avastin, however we will have her come in the day before to have her labs drawn in the event that she will require a UPC ratio for her urine. The patient is cautioned about sudden position changes particularly in the extreme reaching and bending low positions. Patient voiced understanding.   Omaria Plunk E, PA-C  She was advised to call immediately if she has any concerning symptoms in the interval. All questions were answered. The patient knows to call the clinic with any problems, questions or concerns. We can certainly see the patient much sooner if necessary.  I spent 20 minutes counseling the patient face to face. The total time spent in the appointment was 30 minutes.

## 2013-02-16 ENCOUNTER — Other Ambulatory Visit (HOSPITAL_BASED_OUTPATIENT_CLINIC_OR_DEPARTMENT_OTHER): Payer: Medicare Other

## 2013-02-16 ENCOUNTER — Encounter: Payer: Self-pay | Admitting: *Deleted

## 2013-02-16 ENCOUNTER — Encounter: Payer: Self-pay | Admitting: Internal Medicine

## 2013-02-16 DIAGNOSIS — C3490 Malignant neoplasm of unspecified part of unspecified bronchus or lung: Secondary | ICD-10-CM

## 2013-02-16 LAB — CBC WITH DIFFERENTIAL/PLATELET
BASO%: 0.7 % (ref 0.0–2.0)
Basophils Absolute: 0.1 10*3/uL (ref 0.0–0.1)
EOS%: 2.2 % (ref 0.0–7.0)
MCH: 30.1 pg (ref 25.1–34.0)
MCHC: 33.1 g/dL (ref 31.5–36.0)
MCV: 90.8 fL (ref 79.5–101.0)
MONO%: 6.3 % (ref 0.0–14.0)
RBC: 4.65 10*6/uL (ref 3.70–5.45)
RDW: 14.5 % (ref 11.2–14.5)
lymph#: 1.9 10*3/uL (ref 0.9–3.3)

## 2013-02-16 LAB — COMPREHENSIVE METABOLIC PANEL (CC13)
ALT: 12 U/L (ref 0–55)
AST: 17 U/L (ref 5–34)
Albumin: 3.6 g/dL (ref 3.5–5.0)
Alkaline Phosphatase: 75 U/L (ref 40–150)
BUN: 11.7 mg/dL (ref 7.0–26.0)
Potassium: 3.9 mEq/L (ref 3.5–5.1)

## 2013-02-16 LAB — PROTEIN / CREATININE RATIO, URINE: Protein Creatinine Ratio: 1.64 — ABNORMAL HIGH (ref <0.15)

## 2013-02-16 NOTE — Progress Notes (Signed)
Applied for copay assistance to [employer] for Avastin.  Waiting for response.

## 2013-02-17 ENCOUNTER — Ambulatory Visit: Payer: Medicare Other

## 2013-02-17 ENCOUNTER — Telehealth: Payer: Self-pay | Admitting: Internal Medicine

## 2013-02-17 ENCOUNTER — Ambulatory Visit (HOSPITAL_BASED_OUTPATIENT_CLINIC_OR_DEPARTMENT_OTHER): Payer: Medicare Other | Admitting: Internal Medicine

## 2013-02-17 ENCOUNTER — Encounter: Payer: Self-pay | Admitting: Internal Medicine

## 2013-02-17 ENCOUNTER — Ambulatory Visit (HOSPITAL_BASED_OUTPATIENT_CLINIC_OR_DEPARTMENT_OTHER): Payer: Medicare Other

## 2013-02-17 ENCOUNTER — Encounter: Payer: Medicare Other | Admitting: *Deleted

## 2013-02-17 DIAGNOSIS — Z5112 Encounter for antineoplastic immunotherapy: Secondary | ICD-10-CM

## 2013-02-17 DIAGNOSIS — G609 Hereditary and idiopathic neuropathy, unspecified: Secondary | ICD-10-CM

## 2013-02-17 DIAGNOSIS — C3410 Malignant neoplasm of upper lobe, unspecified bronchus or lung: Secondary | ICD-10-CM

## 2013-02-17 MED ORDER — HEPARIN SOD (PORK) LOCK FLUSH 100 UNIT/ML IV SOLN
500.0000 [IU] | Freq: Once | INTRAVENOUS | Status: AC | PRN
  Administered 2013-02-17: 500 [IU]
  Filled 2013-02-17: qty 5

## 2013-02-17 MED ORDER — BEVACIZUMAB CHEMO INJECTION 400 MG/16ML
15.0000 mg/kg | Freq: Once | INTRAVENOUS | Status: AC
  Administered 2013-02-17: 875 mg via INTRAVENOUS
  Filled 2013-02-17: qty 35

## 2013-02-17 MED ORDER — SODIUM CHLORIDE 0.9 % IV SOLN
Freq: Once | INTRAVENOUS | Status: AC
  Administered 2013-02-17: 10:00:00 via INTRAVENOUS

## 2013-02-17 MED ORDER — SODIUM CHLORIDE 0.9 % IJ SOLN
10.0000 mL | INTRAMUSCULAR | Status: DC | PRN
  Administered 2013-02-17: 10 mL
  Filled 2013-02-17: qty 10

## 2013-02-17 MED ORDER — SODIUM CHLORIDE 0.9 % IV SOLN
Freq: Once | INTRAVENOUS | Status: AC
  Administered 2013-02-17: 12:00:00 via INTRAVENOUS

## 2013-02-17 NOTE — Progress Notes (Signed)
02/17/2013  Patient in to clinic today for assessment prior to receiving maintenance bevacizumab cycle 27. She reports having tripped in her yard last week and fell, bruising her left breast, although bruises did not appear for a few days. Due to additional time required for obtaining UPC results, in light of elevated urine protein per dipstick, patient in to clinic on Day -1 (May 6th) for lab tests. Urine protein was 4+ by dipstick, therefore STAT UPC ratio was performed with a result of 1.64. These results, in addition to CBC and chemistry values, were reviewed by Dr. Arbutus Ped and found to be within study parameters for re-treatment today. Based on physical exam and lab results review by Dr. Arbutus Ped, patient condition is acceptable for continued treatment.  Sign for infusion given to Waynard Edwards RN.  Cindy S. Clelia Croft BSN, RN, CCRP 02/19/2013 7:57 AM

## 2013-02-17 NOTE — Patient Instructions (Signed)
Continue maintenance treatment with Avastin today as scheduled.  Followup visit in 3 weeks with repeat CT scan of the chest.

## 2013-02-17 NOTE — Progress Notes (Signed)
[facility] Telephone:([phone]) [phone]   [phone]  OFFICE PROGRESS NOTE  [name],[name], [name]  DIAGNOSIS:  #1 Recurrent non-small cell lung cancer consistent with adenocarcinoma. This was initially diagnosed as stage IB (T2a N0 M0) well-differentiated adenocarcinoma of bronchioalveolar type in December 2010.  #2 Acute pulmonary embolism in the right lower lobe lobar, segmental and subsegmental sized pulmonary arteries, diagnosed on 11/04/2011.   PRIOR THERAPY:  1.Status post right upper lobectomy on September 28, 2009 under the care of Dr. Edwyna Shell. The patient refused adjuvant chemotherapy at that time. She had disease recurrence in June of 2012.  2.Status post 4 cycles of systemic chemotherapy with carboplatin for an AUC of 6 and paclitaxel at 200 mg per meter squared and Avastin at 15 mg/kg according to the ECOG protocol #5508.   CURRENT THERAPY: Chemotherapy with Avastin 15 mg/kg according to the ECOG protocol #5508 status post 26 cycles.   INTERVAL HISTORY: [name] 72 y.o. female returns to the clinic today for followup visit. The patient is feeling fine today with no specific complaints. She did fine and in her yard few days ago and has some bruises in the left breast. 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 The patient denied having any significant chest pain, shortness breath, cough or hemoptysis. She denied having any significant weight loss or night sweats. She denied having any epistaxis, hemoptysis, hematemesis or rectal bleeding. She is here today to start cycle number 27th of her chemotherapy.   MEDICAL HISTORY: Past Medical History  Diagnosis Date  . Lung cancer 03/08/2011    recurrent  . Hypertension   . Hypothyroidism   . Hypercholesterolemia   . Glaucoma(365)   . Hip pain 09/02/2011  . Foot pain 09/02/2011    ALLERGIES:  is allergic to amitriptyline; codeine; and sulfa  antibiotics.  MEDICATIONS:  Current Outpatient Prescriptions  Medication Sig Dispense Refill  . amLODipine (NORVASC) 5 MG tablet Take 10 mg by mouth daily. Per [name], NP      . citalopram (CELEXA) 40 MG tablet Take 40 mg by mouth daily.        . clonazePAM (KLONOPIN) 0.5 MG tablet Take 0.5 mg by mouth 2 (two) times daily as needed. Per [name], NP      . cyanocobalamin (,VITAMIN B-12,) 1000 MCG/ML injection Inject 1,000 mcg into the muscle every 30 (thirty) days.      . dorzolamide-timolol (COSOPT) 22.3-6.8 MG/ML ophthalmic solution 2 drops 2 (two) times daily. Per [name], NP       . levothyroxine (SYNTHROID, LEVOTHROID) 50 MCG tablet Take 50 mcg by mouth 4 (four) times a week. Per [name], NP       . levothyroxine (SYNTHROID, LEVOTHROID) 75 MCG tablet Take 75 mcg by mouth 3 (three) times a week. Per [name], NP       . lidocaine-prilocaine (EMLA) cream Apply 1 application topically as needed.        Marland Kitchen lisinopril (PRINIVIL,ZESTRIL) 40 MG tablet Take 40 mg by mouth daily. Per [name], NP       . prochlorperazine (COMPAZINE) 10 MG tablet Take 10 mg by mouth every 6 (six) hours as needed.        . rosuvastatin (CRESTOR) 10 MG tablet Take 10 mg by mouth daily.         No current facility-administered medications for this visit.    SURGICAL HISTORY:  Past Surgical  History  Procedure Laterality Date  . Video assisted thoracoscopy  09/28/2009  . Thoracotomy  09/28/2009    mini  . Lung lobectomy  09/28/2009    right upper    REVIEW OF SYSTEMS:  A comprehensive review of systems was negative except for: Constitutional: positive for fatigue Neurological: positive for paresthesia   PHYSICAL EXAMINATION: General appearance: alert, cooperative, fatigued and no distress Head: Normocephalic, without obvious abnormality, atraumatic Neck: no adenopathy Lymph nodes: Cervical, supraclavicular, and axillary nodes normal. Resp: clear to auscultation  bilaterally Cardio: regular rate and rhythm, S1, S2 normal, no murmur, click, rub or gallop GI: soft, non-tender; bowel sounds normal; no masses,  no organomegaly Extremities: extremities normal, atraumatic, no cyanosis or edema  ECOG PERFORMANCE STATUS: 1 - Symptomatic but completely ambulatory  Blood pressure 155/72, pulse 68, temperature 97.9 F (36.6 C), temperature source Oral, resp. rate 17, height 5\' 4"  (1.626 m), weight 131 lb 6.4 oz (59.603 kg).  LABORATORY DATA: Lab Results  Component Value Date   WBC 8.1 02/16/2013   HGB 14.0 02/16/2013   HCT 42.2 02/16/2013   MCV 90.8 02/16/2013   PLT 163 02/16/2013      Chemistry      Component Value Date/Time   NA 142 02/16/2013 0929   NA 136 05/20/2012 1416   NA 142 03/08/2011 0754   K 3.9 02/16/2013 0929   K 4.4 05/20/2012 1416   K 4.2 03/08/2011 0754   CL 105 02/16/2013 0929   CL 99 05/20/2012 1416   CL 102 03/08/2011 0754   CO2 27 02/16/2013 0929   CO2 26 05/20/2012 1416   CO2 27 03/08/2011 0754   BUN 11.7 02/16/2013 0929   BUN 6 05/20/2012 1416   BUN 9 03/08/2011 0754   CREATININE 1.0 02/16/2013 0929   CREATININE 1.11* 05/20/2012 1416   CREATININE 1.0 03/08/2011 0754      Component Value Date/Time   CALCIUM 9.7 02/16/2013 0929   CALCIUM 10.3 05/20/2012 1416   CALCIUM 9.5 03/08/2011 0754   ALKPHOS 75 02/16/2013 0929   ALKPHOS 71 05/20/2012 1416   ALKPHOS 59 03/08/2011 0754   AST 17 02/16/2013 0929   AST 9 05/20/2012 1416   AST 19 03/08/2011 0754   ALT 12 02/16/2013 0929   ALT <5 05/20/2012 1416   BILITOT 0.56 02/16/2013 0929   BILITOT 0.7 05/20/2012 1416   BILITOT 0.50 03/08/2011 0754       RADIOGRAPHIC STUDIES: No results found.  ASSESSMENT: This is a very pleasant 72 years old [place] American female with recurrent non-small cell lung cancer currently on maintenance treatment with Avastin status post 26 cycles. The patient is doing fine with no significant adverse of the treatment today.   PLAN: We'll proceed with cycle #27 today as scheduled. The patient  would come back for followup visit in 3 weeks with repeat CT scan of the chest for evaluation of her disease. She was advised to call immediately if she has any concerning symptoms in the interval.  All questions were answered. The patient knows to call the clinic with any problems, questions or concerns. We can certainly see the patient much sooner if necessary.

## 2013-02-17 NOTE — Patient Instructions (Addendum)
[facility] Discharge Instructions for Patients Receiving Chemotherapy  Today you received the following chemotherapy agents Avastin  To help prevent nausea and vomiting after your treatment, we encourage you to take your nausea medication as prescribed.   If you develop nausea and vomiting that is not controlled by your nausea medication, call the clinic. If it is after clinic hours your family physician or the after hours number for the clinic or go to the Emergency Department.   BELOW ARE SYMPTOMS THAT SHOULD BE REPORTED IMMEDIATELY:  *FEVER GREATER THAN 100.5 F  *CHILLS WITH OR WITHOUT FEVER  NAUSEA AND VOMITING THAT IS NOT CONTROLLED WITH YOUR NAUSEA MEDICATION  *UNUSUAL SHORTNESS OF BREATH  *UNUSUAL BRUISING OR BLEEDING  TENDERNESS IN MOUTH AND THROAT WITH OR WITHOUT PRESENCE OF ULCERS  *URINARY PROBLEMS  *BOWEL PROBLEMS  UNUSUAL RASH Items with * indicate a potential emergency and should be followed up as soon as possible.  Feel free to call the clinic you have any questions or concerns. The clinic phone number is [phone].   I have been informed and understand all the instructions given to me. I know to contact the clinic, my physician, or go to the Emergency Department if any problems should occur. I do not have any questions at this time, but understand that I may call the clinic during office hours   should I have any questions or need assistance in obtaining follow up care.    __________________________________________  _____________  __________ Signature of Patient or Authorized Representative            Date                   Time    __________________________________________ Nurse's Signature    

## 2013-02-17 NOTE — Telephone Encounter (Signed)
gv and printed appt sched and avs for pt  °

## 2013-02-19 NOTE — Addendum Note (Signed)
Addended by: Bernerd Pho on: 02/19/2013 07:59 AM   Modules accepted: Orders

## 2013-03-03 ENCOUNTER — Ambulatory Visit (HOSPITAL_COMMUNITY)
Admission: RE | Admit: 2013-03-03 | Discharge: 2013-03-03 | Disposition: A | Discharge status: Home / Self Care | Payer: Medicare Other | Source: Ambulatory Visit | Attending: Internal Medicine | Admitting: Internal Medicine

## 2013-03-03 ENCOUNTER — Other Ambulatory Visit: Payer: Medicare Other | Admitting: Lab

## 2013-03-03 DIAGNOSIS — K7689 Other specified diseases of liver: Secondary | ICD-10-CM | POA: Insufficient documentation

## 2013-03-03 DIAGNOSIS — Z79899 Other long term (current) drug therapy: Secondary | ICD-10-CM | POA: Insufficient documentation

## 2013-03-03 DIAGNOSIS — I7090 Unspecified atherosclerosis: Secondary | ICD-10-CM | POA: Insufficient documentation

## 2013-03-03 DIAGNOSIS — I2510 Atherosclerotic heart disease of native coronary artery without angina pectoris: Secondary | ICD-10-CM | POA: Insufficient documentation

## 2013-03-03 DIAGNOSIS — C3490 Malignant neoplasm of unspecified part of unspecified bronchus or lung: Secondary | ICD-10-CM | POA: Insufficient documentation

## 2013-03-03 DIAGNOSIS — J438 Other emphysema: Secondary | ICD-10-CM | POA: Insufficient documentation

## 2013-03-03 MED ORDER — IOHEXOL 300 MG/ML  SOLN
80.0000 mL | Freq: Once | INTRAMUSCULAR | Status: AC | PRN
  Administered 2013-03-03: 80 mL via INTRAVENOUS

## 2013-03-09 ENCOUNTER — Other Ambulatory Visit: Payer: Self-pay | Admitting: Internal Medicine

## 2013-03-09 ENCOUNTER — Other Ambulatory Visit: Payer: Self-pay | Admitting: *Deleted

## 2013-03-09 ENCOUNTER — Other Ambulatory Visit (HOSPITAL_BASED_OUTPATIENT_CLINIC_OR_DEPARTMENT_OTHER): Payer: Medicare Other | Admitting: Lab

## 2013-03-09 DIAGNOSIS — C3490 Malignant neoplasm of unspecified part of unspecified bronchus or lung: Secondary | ICD-10-CM

## 2013-03-09 LAB — COMPREHENSIVE METABOLIC PANEL (CC13)
ALT: 11 U/L (ref 0–55)
Alkaline Phosphatase: 69 U/L (ref 40–150)
Creatinine: 1.1 mg/dL (ref 0.6–1.1)
Sodium: 145 mEq/L (ref 136–145)
Total Bilirubin: 0.38 mg/dL (ref 0.20–1.20)
Total Protein: 6.7 g/dL (ref 6.4–8.3)

## 2013-03-09 LAB — CBC WITH DIFFERENTIAL/PLATELET
BASO%: 0.6 % (ref 0.0–2.0)
LYMPH%: 30.5 % (ref 14.0–49.7)
MCH: 29.2 pg (ref 25.1–34.0)
MCHC: 31.7 g/dL (ref 31.5–36.0)
MCV: 92.3 fL (ref 79.5–101.0)
MONO%: 6.8 % (ref 0.0–14.0)
Platelets: 142 10*3/uL — ABNORMAL LOW (ref 145–400)
RBC: 4.52 10*6/uL (ref 3.70–5.45)

## 2013-03-09 LAB — MAGNESIUM (CC13): Magnesium: 2.4 mg/dl (ref 1.5–2.5)

## 2013-03-10 ENCOUNTER — Telehealth: Payer: Self-pay | Admitting: Internal Medicine

## 2013-03-10 ENCOUNTER — Telehealth: Payer: Self-pay | Admitting: *Deleted

## 2013-03-10 ENCOUNTER — Ambulatory Visit (HOSPITAL_BASED_OUTPATIENT_CLINIC_OR_DEPARTMENT_OTHER): Payer: Medicare Other | Admitting: Internal Medicine

## 2013-03-10 ENCOUNTER — Encounter: Payer: Medicare Other | Admitting: *Deleted

## 2013-03-10 ENCOUNTER — Ambulatory Visit (HOSPITAL_BASED_OUTPATIENT_CLINIC_OR_DEPARTMENT_OTHER): Payer: Medicare Other

## 2013-03-10 ENCOUNTER — Encounter: Payer: Self-pay | Admitting: Internal Medicine

## 2013-03-10 DIAGNOSIS — Z86711 Personal history of pulmonary embolism: Secondary | ICD-10-CM

## 2013-03-10 DIAGNOSIS — M25519 Pain in unspecified shoulder: Secondary | ICD-10-CM

## 2013-03-10 DIAGNOSIS — Z5112 Encounter for antineoplastic immunotherapy: Secondary | ICD-10-CM

## 2013-03-10 DIAGNOSIS — C3410 Malignant neoplasm of upper lobe, unspecified bronchus or lung: Secondary | ICD-10-CM

## 2013-03-10 DIAGNOSIS — I10 Essential (primary) hypertension: Secondary | ICD-10-CM

## 2013-03-10 DIAGNOSIS — E538 Deficiency of other specified B group vitamins: Secondary | ICD-10-CM

## 2013-03-10 MED ORDER — SODIUM CHLORIDE 0.9 % IV SOLN
Freq: Once | INTRAVENOUS | Status: AC
  Administered 2013-03-10: 16:00:00 via INTRAVENOUS

## 2013-03-10 MED ORDER — CYANOCOBALAMIN 1000 MCG/ML IJ SOLN
1000.0000 ug | Freq: Once | INTRAMUSCULAR | Status: AC
  Administered 2013-03-10: 1000 ug via INTRAMUSCULAR

## 2013-03-10 MED ORDER — SODIUM CHLORIDE 0.9 % IV SOLN
15.0000 mg/kg | Freq: Once | INTRAVENOUS | Status: AC
  Administered 2013-03-10: 875 mg via INTRAVENOUS
  Filled 2013-03-10: qty 35

## 2013-03-10 MED ORDER — SODIUM CHLORIDE 0.9 % IJ SOLN
10.0000 mL | INTRAMUSCULAR | Status: DC | PRN
  Administered 2013-03-10: 10 mL
  Filled 2013-03-10: qty 10

## 2013-03-10 MED ORDER — SODIUM CHLORIDE 0.9 % IV SOLN
Freq: Once | INTRAVENOUS | Status: AC
  Administered 2013-03-10: 15:00:00 via INTRAVENOUS

## 2013-03-10 MED ORDER — HEPARIN SOD (PORK) LOCK FLUSH 100 UNIT/ML IV SOLN
500.0000 [IU] | Freq: Once | INTRAVENOUS | Status: AC | PRN
  Administered 2013-03-10: 500 [IU]
  Filled 2013-03-10: qty 5

## 2013-03-10 NOTE — Progress Notes (Signed)
03/10/2013  Patient in to clinic today for evaluation prior to receiving treatment cycle 28. Due to continued 4+ proteinuria, patient had labwork completed on 03/09/2013, to allow additional time for completion of UPC ratio. Blood and urine test results were reviewed by Dr. Arbutus Ped and felt to be acceptable for treatment, based on protocol dose modification requirements. CT scan results from 03/03/2013 revealed stable disease, therefore patient will continue with protocol therapy. Due to elevation in blood pressure, anti-hypertensive medication dose was changed by Dr. Arbutus Ped today. Patient verbalized understanding of the above information. She reports no significant symptoms other than continued peripheral neuropathy and shortness of breath with exertion.  Sign for infusion given to Burnard Bunting RN.  Cindy S. Clelia Croft BSN, RN, [facility] 03/10/2013 3:17 PM

## 2013-03-10 NOTE — Telephone Encounter (Signed)
Gave pt appt for June 2014 lab before MD then chemo emailed Marcelino Duster regarding appt

## 2013-03-10 NOTE — Progress Notes (Signed)
[facility] Telephone:(336) [phone]   Fax:(336) [phone]  OFFICE PROGRESS NOTE  [name],[name], [name]  DIAGNOSIS:  #1 Recurrent non-small cell lung cancer consistent with adenocarcinoma. This was initially diagnosed as stage IB (T2a N0 M0) well-differentiated adenocarcinoma of bronchioalveolar type in December 2010.  #2 Acute pulmonary embolism in the right lower lobe lobar, segmental and subsegmental sized pulmonary arteries, diagnosed on 11/04/2011.   PRIOR THERAPY:  1.Status post right upper lobectomy on September 28, 2009 under the care of Dr. Edwyna Shell. The patient refused adjuvant chemotherapy at that time. She had disease recurrence in June of 2012.  2.Status post 4 cycles of systemic chemotherapy with carboplatin for an AUC of 6 and paclitaxel at 200 mg per meter squared and Avastin at 15 mg/kg according to the ECOG protocol #5508.   CURRENT THERAPY: Chemotherapy with Avastin 15 mg/kg according to the ECOG protocol #5508 status post 27 cycles.   INTERVAL HISTORY: [name] 72 y.o. female returns to the clinic today for followup visit. The patient is feeling fine today with no specific complaints except for arthralgia in the shoulders bilaterally. She denied having any significant chest pain, shortness of breath except with exertion, no cough or hemoptysis. The patient denied having any significant nausea or vomiting. She is tolerating her treatment with the Avastin fairly well with no significant bleeding issues. She had repeat CT scan of the chest performed recently and she is here for evaluation and discussion of her scan results.  MEDICAL HISTORY: Past Medical History  Diagnosis Date  . Lung cancer 03/08/2011    recurrent  . Hypertension   . Hypothyroidism   . Hypercholesterolemia   . Glaucoma   . Hip pain 09/02/2011  . Foot pain 09/02/2011    ALLERGIES:  is allergic to amitriptyline; codeine; and sulfa antibiotics.  MEDICATIONS:    Current Outpatient Prescriptions  Medication Sig Dispense Refill  . amLODipine (NORVASC) 5 MG tablet Take 10 mg by mouth daily. Per [name], NP      . citalopram (CELEXA) 40 MG tablet Take 40 mg by mouth daily.        . clonazePAM (KLONOPIN) 0.5 MG tablet Take 0.5 mg by mouth 2 (two) times daily as needed. Per [name], NP      . cyanocobalamin (,VITAMIN B-12,) 1000 MCG/ML injection Inject 1,000 mcg into the muscle every 30 (thirty) days.      . dorzolamide-timolol (COSOPT) 22.3-6.8 MG/ML ophthalmic solution 2 drops 2 (two) times daily. Per [name], NP       . levothyroxine (SYNTHROID, LEVOTHROID) 50 MCG tablet Take 50 mcg by mouth 4 (four) times a week. Per [name], NP       . levothyroxine (SYNTHROID, LEVOTHROID) 75 MCG tablet Take 75 mcg by mouth 3 (three) times a week. Per [name], NP       . lidocaine-prilocaine (EMLA) cream Apply 1 application topically as needed.        Marland Kitchen lisinopril (PRINIVIL,ZESTRIL) 40 MG tablet Take 40 mg by mouth daily. Per [name], NP       . prochlorperazine (COMPAZINE) 10 MG tablet Take 10 mg by mouth every 6 (six) hours as needed.        . rosuvastatin (CRESTOR) 10 MG tablet Take 10 mg by mouth daily.         No current facility-administered medications for this visit.    SURGICAL HISTORY:  Past Surgical History  Procedure Laterality  Date  . Video assisted thoracoscopy  09/28/2009  . Thoracotomy  09/28/2009    mini  . Lung lobectomy  09/28/2009    right upper    REVIEW OF SYSTEMS:  A comprehensive review of systems was negative except for: Respiratory: positive for dyspnea on exertion   PHYSICAL EXAMINATION: General appearance: alert, cooperative and no distress Head: Normocephalic, without obvious abnormality, atraumatic Neck: no adenopathy Lymph nodes: Cervical, supraclavicular, and axillary nodes normal. Resp: clear to auscultation bilaterally Cardio: regular rate and rhythm, S1, S2 normal, no murmur, click, rub  or gallop GI: soft, non-tender; bowel sounds normal; no masses,  no organomegaly Extremities: extremities normal, atraumatic, no cyanosis or edema  ECOG PERFORMANCE STATUS: 1 - Symptomatic but completely ambulatory  Blood pressure 170/77, pulse 72, temperature 96.9 F (36.1 C), temperature source Oral, resp. rate 18, height 5\' 4"  (1.626 m), weight 131 lb 3.2 oz (59.512 kg).  LABORATORY DATA: Lab Results  Component Value Date   WBC 6.6 03/09/2013   HGB 13.2 03/09/2013   HCT 41.7 03/09/2013   MCV 92.3 03/09/2013   PLT 142* 03/09/2013      Chemistry      Component Value Date/Time   NA 145 03/09/2013 0843   NA 136 05/20/2012 1416   NA 142 03/08/2011 0754   K 3.6 03/09/2013 0843   K 4.4 05/20/2012 1416   K 4.2 03/08/2011 0754   CL 107 03/09/2013 0843   CL 99 05/20/2012 1416   CL 102 03/08/2011 0754   CO2 28 03/09/2013 0843   CO2 26 05/20/2012 1416   CO2 27 03/08/2011 0754   BUN 11.6 03/09/2013 0843   BUN 6 05/20/2012 1416   BUN 9 03/08/2011 0754   CREATININE 1.1 03/09/2013 0843   CREATININE 1.11* 05/20/2012 1416   CREATININE 1.0 03/08/2011 0754      Component Value Date/Time   CALCIUM 9.5 03/09/2013 0843   CALCIUM 10.3 05/20/2012 1416   CALCIUM 9.5 03/08/2011 0754   ALKPHOS 69 03/09/2013 0843   ALKPHOS 71 05/20/2012 1416   ALKPHOS 59 03/08/2011 0754   AST 13 03/09/2013 0843   AST 9 05/20/2012 1416   AST 19 03/08/2011 0754   ALT 11 03/09/2013 0843   ALT <5 05/20/2012 1416   BILITOT 0.38 03/09/2013 0843   BILITOT 0.7 05/20/2012 1416   BILITOT 0.50 03/08/2011 0754       RADIOGRAPHIC STUDIES: Ct Chest W Contrast  03/03/2013   *RADIOLOGY REPORT*  Clinical Data: Lung cancer, chemotherapy in progress.  CT CHEST WITH CONTRAST  Technique:  Multidetector CT imaging of the chest was performed following the standard protocol during bolus administration of intravenous contrast.  Contrast: 80mL OMNIPAQUE IOHEXOL 300 MG/ML  SOLN  Comparison: 12/31/2012 and 03/08/2011.  Recist protocol 1.1 measurements:  Lung, peripheral  posterior right lower lobe - 1.5 x 1.5 cm (image 44)  Lung, superior segment left lower lobe - 4.6 x 5.1 cm (image 26).  Findings: No pathologically enlarged mediastinal, hilar or axillary lymph nodes.  Atherosclerotic calcification of the arterial vasculature, including coronary arteries.  Heart size normal.  No pericardial effusion.  Lungs are emphysematous.  Postoperative changes of right upper lobectomy with associated scarring and volume loss.  A lace-like ground-glass lesion in the posteromedial right lower lobe measures 1.5 x 3.2 cm on image 35 (previously 1.0 x 2.5 cm on 03/08/2011). Dominant lace-like mixed attenuation lesion in the superior segment left lower lobe measures 4.6 x 5.1 cm on image 26 (previously 4.3 x 4.4  cm on 03/08/2011). Additional irregular ground-glass nodules are grossly stable bilaterally.  No pleural fluid.  Airway is unremarkable.  Incidental imaging of the upper abdomen shows a sub centimeter low attenuation lesion in the hepatic dome, unchanged from 03/08/2011. No worrisome lytic or sclerotic lesions.  IMPRESSION:  1.  Recist protocol 1.1 measurements are unchanged from 12/31/2012. 2.  When compared with 03/08/2011, lesions in the lower lobes have enlarged slightly, as discussed above.   Original Report Authenticated By: Leanna Battles, M.D.    ASSESSMENT: This is a very pleasant 72 years old African American female with metastatic non-small cell lung cancer currently on treatment with maintenance Avastin status post 27 cycles.   PLAN: The patient is doing fine and she has no evidence for disease progression on his recent scan. I discussed the scan results with Ms. Finkel and recommended for her to continue his maintenance Avastin as scheduled. For hypertension, I would increase her dose of Norvasc to 10 mg and she will continue on lisinopril 40 mg by mouth daily. The patient would come back for followup visit in 3 weeks with the next cycle of her chemotherapy  All  questions were answered. The patient knows to call the clinic with any problems, questions or concerns. We can certainly see the patient much sooner if necessary.

## 2013-03-10 NOTE — Telephone Encounter (Signed)
Per staff message and POF I have scheduled appts.  JMW  

## 2013-03-10 NOTE — Patient Instructions (Signed)
The scan showed no evidence for disease progression. Followup in 3 weeks with the next cycle of chemotherapy.

## 2013-03-11 ENCOUNTER — Telehealth: Payer: Self-pay | Admitting: Internal Medicine

## 2013-03-11 NOTE — Telephone Encounter (Signed)
Talked to pt and gave her appt for lab, MD and chemo [id number]

## 2013-03-30 ENCOUNTER — Other Ambulatory Visit: Payer: Self-pay | Admitting: *Deleted

## 2013-03-30 ENCOUNTER — Other Ambulatory Visit (HOSPITAL_BASED_OUTPATIENT_CLINIC_OR_DEPARTMENT_OTHER): Payer: Medicare Other | Admitting: Lab

## 2013-03-30 DIAGNOSIS — C3410 Malignant neoplasm of upper lobe, unspecified bronchus or lung: Secondary | ICD-10-CM

## 2013-03-30 LAB — CBC WITH DIFFERENTIAL/PLATELET
BASO%: 1 % (ref 0.0–2.0)
LYMPH%: 27.6 % (ref 14.0–49.7)
MCHC: 33.7 g/dL (ref 31.5–36.0)
MONO#: 0.5 10*3/uL (ref 0.1–0.9)
NEUT#: 4.2 10*3/uL (ref 1.5–6.5)
RBC: 4.42 10*6/uL (ref 3.70–5.45)
RDW: 14.9 % — ABNORMAL HIGH (ref 11.2–14.5)
WBC: 6.8 10*3/uL (ref 3.9–10.3)
lymph#: 1.9 10*3/uL (ref 0.9–3.3)

## 2013-03-30 LAB — COMPREHENSIVE METABOLIC PANEL (CC13)
ALT: 10 U/L (ref 0–55)
AST: 12 U/L (ref 5–34)
Calcium: 9.5 mg/dL (ref 8.4–10.4)
Chloride: 107 mEq/L (ref 98–107)
Creatinine: 1 mg/dL (ref 0.6–1.1)
Potassium: 3.8 mEq/L (ref 3.5–5.1)
Sodium: 142 mEq/L (ref 136–145)
Total Protein: 6.4 g/dL (ref 6.4–8.3)

## 2013-03-30 LAB — UA PROTEIN, DIPSTICK - CHCC: Protein, ur: 300 mg/dL

## 2013-03-30 LAB — PROTEIN / CREATININE RATIO, URINE: Total Protein, Urine: 562 mg/dL

## 2013-03-31 ENCOUNTER — Telehealth: Payer: Self-pay | Admitting: Internal Medicine

## 2013-03-31 ENCOUNTER — Ambulatory Visit (HOSPITAL_BASED_OUTPATIENT_CLINIC_OR_DEPARTMENT_OTHER): Payer: Medicare Other | Admitting: Internal Medicine

## 2013-03-31 ENCOUNTER — Telehealth: Payer: Self-pay | Admitting: *Deleted

## 2013-03-31 ENCOUNTER — Encounter: Payer: Self-pay | Admitting: Internal Medicine

## 2013-03-31 ENCOUNTER — Encounter: Payer: Medicare Other | Admitting: *Deleted

## 2013-03-31 ENCOUNTER — Ambulatory Visit (HOSPITAL_BASED_OUTPATIENT_CLINIC_OR_DEPARTMENT_OTHER): Payer: Medicare Other

## 2013-03-31 DIAGNOSIS — Z5112 Encounter for antineoplastic immunotherapy: Secondary | ICD-10-CM

## 2013-03-31 DIAGNOSIS — E538 Deficiency of other specified B group vitamins: Secondary | ICD-10-CM

## 2013-03-31 DIAGNOSIS — G5790 Unspecified mononeuropathy of unspecified lower limb: Secondary | ICD-10-CM

## 2013-03-31 DIAGNOSIS — M7989 Other specified soft tissue disorders: Secondary | ICD-10-CM

## 2013-03-31 DIAGNOSIS — C3410 Malignant neoplasm of upper lobe, unspecified bronchus or lung: Secondary | ICD-10-CM

## 2013-03-31 DIAGNOSIS — G5690 Unspecified mononeuropathy of unspecified upper limb: Secondary | ICD-10-CM

## 2013-03-31 MED ORDER — SODIUM CHLORIDE 0.9 % IV SOLN
Freq: Once | INTRAVENOUS | Status: AC
  Administered 2013-03-31: 13:00:00 via INTRAVENOUS

## 2013-03-31 MED ORDER — SODIUM CHLORIDE 0.9 % IJ SOLN
10.0000 mL | INTRAMUSCULAR | Status: DC | PRN
  Administered 2013-03-31: 10 mL
  Filled 2013-03-31: qty 10

## 2013-03-31 MED ORDER — SODIUM CHLORIDE 0.9 % IV SOLN
Freq: Once | INTRAVENOUS | Status: AC
Start: 2013-03-31 — End: 2013-03-31
  Administered 2013-03-31: 13:00:00 via INTRAVENOUS

## 2013-03-31 MED ORDER — CYANOCOBALAMIN 1000 MCG/ML IJ SOLN
1000.0000 ug | Freq: Once | INTRAMUSCULAR | Status: AC
  Administered 2013-03-31: 1000 ug via INTRAMUSCULAR

## 2013-03-31 MED ORDER — HEPARIN SOD (PORK) LOCK FLUSH 100 UNIT/ML IV SOLN
500.0000 [IU] | Freq: Once | INTRAVENOUS | Status: AC | PRN
  Administered 2013-03-31: 500 [IU]
  Filled 2013-03-31: qty 5

## 2013-03-31 MED ORDER — SODIUM CHLORIDE 0.9 % IV SOLN
15.0000 mg/kg | Freq: Once | INTRAVENOUS | Status: AC
  Administered 2013-03-31: 875 mg via INTRAVENOUS
  Filled 2013-03-31: qty 35

## 2013-03-31 NOTE — Telephone Encounter (Signed)
Pt appt was change from July 15th labs to 7/22 and chemo also was moved to 7/23 per Judithann Graves, Research nurse,pt aware of appt

## 2013-03-31 NOTE — Telephone Encounter (Signed)
Per the scheduler I have moved appts out one week.

## 2013-03-31 NOTE — Progress Notes (Signed)
Patient in to clinic today for evaluation prior to receiving maintenance treatment cycle 29. Patient has no new complaints today, and continues to report moderate peripheral neuropathy. Urine protein results reviewed by Dr. Arbutus Ped; will continue protocol therapy at present, and continue to monitor proteinuria closely. Based on lab results review and physical exam by Dr. Arbutus Ped, patient condition is acceptable for continued treatment.  Sign for infusion given to Adella Hare RN.  Patient reports that she expects to travel out of town for two weeks, before and after the July 4th holiday. Next cycle of treatment is due on 04/21/2013, however, per patient preference, treatment will be delayed by one week to allow her to go on vacation.  Cindy S. Clelia Croft BSN, RN, [facility] 03/31/2013 12:34 PM

## 2013-03-31 NOTE — Patient Instructions (Signed)
Continue treatment with Avastin today as scheduled. Followup visit in 3 weeks with repeat CT scan of the chest

## 2013-03-31 NOTE — Telephone Encounter (Signed)
Per staff phone call and POF I have schedueld appts.  JMW  

## 2013-03-31 NOTE — Telephone Encounter (Signed)
Gave pt appt for lab,md and chemo for July 2014

## 2013-03-31 NOTE — Patient Instructions (Addendum)
[facility] Discharge Instructions for Patients Receiving Chemotherapy  Today you received the following chemotherapy agents: avastin, vit B  To help prevent nausea and vomiting after your treatment, we encourage you to take your nausea medication.  Take it as often as prescribed.     If you develop nausea and vomiting that is not controlled by your nausea medication, call the clinic. If it is after clinic hours your family physician or the after hours number for the clinic or go to the Emergency Department.   BELOW ARE SYMPTOMS THAT SHOULD BE REPORTED IMMEDIATELY:  *FEVER GREATER THAN 100.5 F  *CHILLS WITH OR WITHOUT FEVER  NAUSEA AND VOMITING THAT IS NOT CONTROLLED WITH YOUR NAUSEA MEDICATION  *UNUSUAL SHORTNESS OF BREATH  *UNUSUAL BRUISING OR BLEEDING  TENDERNESS IN MOUTH AND THROAT WITH OR WITHOUT PRESENCE OF ULCERS  *URINARY PROBLEMS  *BOWEL PROBLEMS  UNUSUAL RASH Items with * indicate a potential emergency and should be followed up as soon as possible.  Feel free to call the clinic you have any questions or concerns. The clinic phone number is [phone].   I have been informed and understand all the instructions given to me. I know to contact the clinic, my physician, or go to the Emergency Department if any problems should occur. I do not have any questions at this time, but understand that I may call the clinic during office hours   should I have any questions or need assistance in obtaining follow up care.    __________________________________________  _____________  __________ Signature of Patient or Authorized Representative            Date                   Time    __________________________________________ Nurse's Signature

## 2013-03-31 NOTE — Progress Notes (Signed)
[facility] [phone]   [phone]  OFFICE PROGRESS NOTE  [name],[name], [name]  DIAGNOSIS:  #1 Recurrent non-small cell lung cancer consistent with adenocarcinoma. This was initially diagnosed as stage IB (T2a N0 M0) well-differentiated adenocarcinoma of bronchioalveolar type in December 2010.  #2 Acute pulmonary embolism in the right lower lobe lobar, segmental and subsegmental sized pulmonary arteries, diagnosed on 11/04/2011.   PRIOR THERAPY:  1.Status post right upper lobectomy on September 28, 2009 under the care of Dr. Edwyna Shell. The patient refused adjuvant chemotherapy at that time. She had disease recurrence in June of 2012.  2.Status post 4 cycles of systemic chemotherapy with carboplatin for an AUC of 6 and paclitaxel at 200 mg per meter squared and Avastin at 15 mg/kg according to the ECOG protocol #5508.   CURRENT THERAPY: Chemotherapy with Avastin 15 mg/kg according to the ECOG protocol #5508 status post 28 cycles.   INTERVAL HISTORY: [name] 72 y.o. female returns to the clinic today for followup visit. 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 She denied having any significant chest pain, shortness breath, cough or hemoptysis. She has no nausea or vomiting. She tolerated the last cycle of her treatment with Avastin fairly well. She denied having any significant bleeding issues. Her urine protein creatinine ratio is 1.99.  MEDICAL HISTORY: Past Medical History  Diagnosis Date  . Lung cancer 03/08/2011    recurrent  . Hypertension   . Hypothyroidism   . Hypercholesterolemia   . Glaucoma   . Hip pain 09/02/2011  . Foot pain 09/02/2011    ALLERGIES:  is allergic to amitriptyline; codeine; and sulfa antibiotics.  MEDICATIONS:  Current Outpatient Prescriptions    Medication Sig Dispense Refill  . amLODipine (NORVASC) 5 MG tablet Take 10 mg by mouth daily. Per [name], NP      . citalopram (CELEXA) 40 MG tablet Take 40 mg by mouth daily.        . clonazePAM (KLONOPIN) 0.5 MG tablet Take 0.5 mg by mouth 2 (two) times daily as needed. Per [name], NP      . cyanocobalamin (,VITAMIN B-12,) 1000 MCG/ML injection Inject 1,000 mcg into the muscle every 30 (thirty) days.      . dorzolamide-timolol (COSOPT) 22.3-6.8 MG/ML ophthalmic solution 2 drops 2 (two) times daily. Per [name], NP       . levothyroxine (SYNTHROID, LEVOTHROID) 50 MCG tablet Take 50 mcg by mouth 4 (four) times a week. Per [name], NP       . levothyroxine (SYNTHROID, LEVOTHROID) 75 MCG tablet Take 75 mcg by mouth 3 (three) times a week. Per [name], NP       . lidocaine-prilocaine (EMLA) cream Apply 1 application topically as needed.        Marland Kitchen lisinopril (PRINIVIL,ZESTRIL) 40 MG tablet Take 40 mg by mouth daily. Per [name], NP       . prochlorperazine (COMPAZINE) 10 MG tablet Take 10 mg by mouth every 6 (six) hours as needed.        . rosuvastatin (CRESTOR) 10 MG tablet Take 10 mg by mouth daily.         No current facility-administered medications for this visit.    SURGICAL HISTORY:  Past Surgical History  Procedure Laterality Date  . Video assisted thoracoscopy  09/28/2009  . Thoracotomy  09/28/2009    mini  . Lung lobectomy  09/28/2009    right upper    REVIEW OF SYSTEMS:  A comprehensive review of systems was negative except for: Neurological: positive for paresthesia   PHYSICAL EXAMINATION: General appearance: alert, cooperative and no distress Head: Normocephalic, without obvious abnormality, atraumatic Neck: no adenopathy Lymph nodes: Cervical, supraclavicular, and axillary nodes normal. Resp: clear to auscultation bilaterally Cardio: regular rate and rhythm, S1, S2 normal, no murmur, click, rub or gallop GI: soft, non-tender; bowel  sounds normal; no masses,  no organomegaly Extremities: extremities normal, atraumatic, no cyanosis or edema  ECOG PERFORMANCE STATUS: 1 - Symptomatic but completely ambulatory  Blood pressure 131/72, pulse 71, temperature 97.5 F (36.4 C), temperature source Oral, resp. rate 20, height 5\' 4"  (1.626 m), weight 130 lb 11.2 oz (59.285 kg).  LABORATORY DATA: Lab Results  Component Value Date   WBC 6.8 03/30/2013   HGB 13.3 03/30/2013   HCT 39.5 03/30/2013   MCV 89.5 03/30/2013   PLT 153 03/30/2013      Chemistry      Component Value Date/Time   NA 142 03/30/2013 1036   NA 136 05/20/2012 1416   NA 142 03/08/2011 0754   K 3.8 03/30/2013 1036   K 4.4 05/20/2012 1416   K 4.2 03/08/2011 0754   CL 107 03/30/2013 1036   CL 99 05/20/2012 1416   CL 102 03/08/2011 0754   CO2 26 03/30/2013 1036   CO2 26 05/20/2012 1416   CO2 27 03/08/2011 0754   BUN 10.0 03/30/2013 1036   BUN 6 05/20/2012 1416   BUN 9 03/08/2011 0754   CREATININE 1.0 03/30/2013 1036   CREATININE 1.11* 05/20/2012 1416   CREATININE 1.0 03/08/2011 0754      Component Value Date/Time   CALCIUM 9.5 03/30/2013 1036   CALCIUM 10.3 05/20/2012 1416   CALCIUM 9.5 03/08/2011 0754   ALKPHOS 69 03/30/2013 1036   ALKPHOS 71 05/20/2012 1416   ALKPHOS 59 03/08/2011 0754   AST 12 03/30/2013 1036   AST 9 05/20/2012 1416   AST 19 03/08/2011 0754   ALT 10 03/30/2013 1036   ALT <5 05/20/2012 1416   BILITOT 0.55 03/30/2013 1036   BILITOT 0.7 05/20/2012 1416   BILITOT 0.50 03/08/2011 0754       ASSESSMENT AND PLAN: This is a very pleasant 73 years old white female with recurrent non-small cell lung cancer currently on maintenance Avastin 15 mg/kilogram every 3 weeks status post 28 cycles. She is tolerating her treatment fairly well with no significant adverse effect. Her routine creatinine ratio is still elevated but within the acceptable range for the study. We'll proceed with her current Treatment with Avastin today as scheduled. She would come back for followup visit  in 3 weeks with the next cycle of her treatment with repeat imaging studies. She was advised to call immediately if she has any concerning symptoms in the interval.  All questions were answered. The patient knows to call the clinic with any problems, questions or concerns. We can certainly see the patient much sooner if necessary.

## 2013-04-09 ENCOUNTER — Telehealth: Payer: Self-pay | Admitting: Medical Oncology

## 2013-04-09 NOTE — Telephone Encounter (Signed)
received Citalopram refill from another vender ( per her pharmacy) . She believes the pills ar different and making her sleepier. I called her back nad left message to call prescriber and/or bring pills back to her pharmacy.

## 2013-04-20 ENCOUNTER — Other Ambulatory Visit: Payer: Medicare Other

## 2013-04-20 ENCOUNTER — Other Ambulatory Visit: Payer: Medicare Other | Admitting: Lab

## 2013-04-21 ENCOUNTER — Ambulatory Visit: Payer: Medicare Other | Admitting: Internal Medicine

## 2013-04-21 ENCOUNTER — Ambulatory Visit: Payer: Medicare Other

## 2013-04-27 ENCOUNTER — Other Ambulatory Visit (HOSPITAL_BASED_OUTPATIENT_CLINIC_OR_DEPARTMENT_OTHER): Payer: Medicare Other | Admitting: Lab

## 2013-04-27 ENCOUNTER — Other Ambulatory Visit: Payer: Self-pay | Admitting: *Deleted

## 2013-04-27 DIAGNOSIS — C3490 Malignant neoplasm of unspecified part of unspecified bronchus or lung: Secondary | ICD-10-CM

## 2013-04-27 LAB — MAGNESIUM (CC13): Magnesium: 2.2 mg/dl (ref 1.5–2.5)

## 2013-04-27 LAB — UA PROTEIN, DIPSTICK - CHCC: Protein, ur: 2000 mg/dL

## 2013-04-27 LAB — COMPREHENSIVE METABOLIC PANEL (CC13)
Albumin: 3.3 g/dL — ABNORMAL LOW (ref 3.5–5.0)
BUN: 9.4 mg/dL (ref 7.0–26.0)
CO2: 24 mEq/L (ref 22–29)
Calcium: 9.5 mg/dL (ref 8.4–10.4)
Chloride: 109 mEq/L (ref 98–109)
Glucose: 99 mg/dl (ref 70–140)
Potassium: 3.5 mEq/L (ref 3.5–5.1)
Sodium: 145 mEq/L (ref 136–145)
Total Protein: 6.4 g/dL (ref 6.4–8.3)

## 2013-04-27 LAB — LACTATE DEHYDROGENASE (CC13): LDH: 214 U/L (ref 125–245)

## 2013-04-27 LAB — CBC WITH DIFFERENTIAL/PLATELET
Basophils Absolute: 0.1 10*3/uL (ref 0.0–0.1)
Eosinophils Absolute: 0.2 10*3/uL (ref 0.0–0.5)
HGB: 13.6 g/dL (ref 11.6–15.9)
MONO%: 7.6 % (ref 0.0–14.0)
NEUT#: 4 10*3/uL (ref 1.5–6.5)
RBC: 4.52 10*6/uL (ref 3.70–5.45)
RDW: 15 % — ABNORMAL HIGH (ref 11.2–14.5)
WBC: 6.9 10*3/uL (ref 3.9–10.3)
lymph#: 2.1 10*3/uL (ref 0.9–3.3)

## 2013-04-28 ENCOUNTER — Ambulatory Visit: Payer: Medicare Other | Admitting: Internal Medicine

## 2013-04-28 ENCOUNTER — Telehealth: Payer: Self-pay | Admitting: *Deleted

## 2013-04-28 ENCOUNTER — Ambulatory Visit: Payer: Medicare Other

## 2013-04-28 LAB — PROTEIN / CREATININE RATIO, URINE
Protein Creatinine Ratio: 4.02 — ABNORMAL HIGH (ref <0.15)
Total Protein, Urine: 1383 mg/dL

## 2013-04-28 NOTE — Telephone Encounter (Signed)
Per staff message and POF I have scheduled appts.  JMW  

## 2013-04-28 NOTE — Telephone Encounter (Signed)
04/28/2013 Spoke with patient by phone today after learning that she had called in to reschedule her appointments from today to tomorrow. Patient states that she has had diarrhea and nausea starting last night, that she feels is due to something she ate. Only other complaint is intermittent swelling of her lower extremities over the past two weeks, although this has also been noted a previous office visits. Patient is aware that she should call the office for any additional symptoms. Also explained to patient that it is not expected that she will receive treatment this week, due to elevated protein in the urine, however, she should still be evaluated by her medical oncologist. Patient will be here for tomorrow's appointment at 10:15. Cindy S. Clelia Croft BSN, RN, CCRP 04/28/2013 9:27 AM

## 2013-04-29 ENCOUNTER — Telehealth: Payer: Self-pay | Admitting: Internal Medicine

## 2013-04-29 ENCOUNTER — Ambulatory Visit: Payer: Medicare Other

## 2013-04-29 ENCOUNTER — Encounter: Payer: Self-pay | Admitting: Internal Medicine

## 2013-04-29 ENCOUNTER — Encounter: Payer: Medicare Other | Admitting: *Deleted

## 2013-04-29 ENCOUNTER — Ambulatory Visit (HOSPITAL_BASED_OUTPATIENT_CLINIC_OR_DEPARTMENT_OTHER): Payer: Medicare Other | Admitting: Internal Medicine

## 2013-04-29 ENCOUNTER — Telehealth: Payer: Self-pay | Admitting: *Deleted

## 2013-04-29 ENCOUNTER — Ambulatory Visit (HOSPITAL_BASED_OUTPATIENT_CLINIC_OR_DEPARTMENT_OTHER): Payer: Medicare Other

## 2013-04-29 DIAGNOSIS — I10 Essential (primary) hypertension: Secondary | ICD-10-CM

## 2013-04-29 DIAGNOSIS — C3490 Malignant neoplasm of unspecified part of unspecified bronchus or lung: Secondary | ICD-10-CM

## 2013-04-29 DIAGNOSIS — E538 Deficiency of other specified B group vitamins: Secondary | ICD-10-CM

## 2013-04-29 DIAGNOSIS — C3410 Malignant neoplasm of upper lobe, unspecified bronchus or lung: Secondary | ICD-10-CM

## 2013-04-29 MED ORDER — CYANOCOBALAMIN 1000 MCG/ML IJ SOLN
1000.0000 ug | Freq: Once | INTRAMUSCULAR | Status: AC
  Administered 2013-04-29: 1000 ug via INTRAMUSCULAR

## 2013-04-29 NOTE — Progress Notes (Signed)
[facility] [phone]   [phone]  OFFICE PROGRESS NOTE  [name],[name], [name]  DIAGNOSIS:  #1 Recurrent non-small cell lung cancer consistent with adenocarcinoma. This was initially diagnosed as stage IB (T2a N0 M0) well-differentiated adenocarcinoma of bronchioalveolar type in December 2010.  #2 Acute pulmonary embolism in the right lower lobe lobar, segmental and subsegmental sized pulmonary arteries, diagnosed on 11/04/2011.   PRIOR THERAPY:  1.Status post right upper lobectomy on September 28, 2009 under the care of Dr. Edwyna Shell. The patient refused adjuvant chemotherapy at that time. She had disease recurrence in June of 2012.  2.Status post 4 cycles of systemic chemotherapy with carboplatin for an AUC of 6 and paclitaxel at 200 mg per meter squared and Avastin at 15 mg/kg according to the ECOG protocol #5508.   CURRENT THERAPY: Chemotherapy with Avastin 15 mg/kg according to the ECOG protocol #5508 status post 29 cycles.   INTERVAL HISTORY: [name] 72 y.o. female returns to the clinic today for followup visit. The patient is feeling fine today with no specific complaints except for the persistent peripheral neuropathy and mild swelling of the feet and hands. She also has some issues with her depression medication as she was getting a different patch of Celexa which doesn't seem to have the same as as the previous one. The patient denied having any significant chest pain, shortness breath, cough or hemoptysis. She denied having any weight loss or night sweats. She has no nausea or vomiting. She has no bleeding issues. Headache and dipstick as well as the liver and protein/creatinine ratio is elevated. The patient is here today for evaluation and recommendation regarding her treatment.  MEDICAL HISTORY: Past Medical History  Diagnosis Date  . Lung cancer 03/08/2011    recurrent  . Hypertension   . Hypothyroidism   .  Hypercholesterolemia   . Glaucoma   . Hip pain 09/02/2011  . Foot pain 09/02/2011    ALLERGIES:  is allergic to amitriptyline; codeine; and sulfa antibiotics.  MEDICATIONS:  Current Outpatient Prescriptions  Medication Sig Dispense Refill  . amLODipine (NORVASC) 5 MG tablet Take 10 mg by mouth daily. Per [name], NP      . clonazePAM (KLONOPIN) 0.5 MG tablet Take 0.5 mg by mouth 2 (two) times daily as needed. Per [name], NP      . cyanocobalamin (,VITAMIN B-12,) 1000 MCG/ML injection Inject 1,000 mcg into the muscle every 30 (thirty) days.      . dorzolamide-timolol (COSOPT) 22.3-6.8 MG/ML ophthalmic solution 2 drops 2 (two) times daily. Per [name], NP       . levothyroxine (SYNTHROID, LEVOTHROID) 50 MCG tablet Take 50 mcg by mouth 4 (four) times a week. Per [name], NP       . levothyroxine (SYNTHROID, LEVOTHROID) 75 MCG tablet Take 75 mcg by mouth 3 (three) times a week. Per [name], NP       . lidocaine-prilocaine (EMLA) cream Apply 1 application topically as needed.        Marland Kitchen lisinopril (PRINIVIL,ZESTRIL) 40 MG tablet Take 40 mg by mouth daily. Per [name], NP       . prochlorperazine (COMPAZINE) 10 MG tablet Take 10 mg by mouth every 6 (six) hours as needed.        . rosuvastatin (CRESTOR) 10 MG tablet Take 10 mg by mouth daily.        . citalopram (CELEXA) 40 MG tablet Take 40  mg by mouth daily.         No current facility-administered medications for this visit.    SURGICAL HISTORY:  Past Surgical History  Procedure Laterality Date  . Video assisted thoracoscopy  09/28/2009  . Thoracotomy  09/28/2009    mini  . Lung lobectomy  09/28/2009    right upper    REVIEW OF SYSTEMS:  A comprehensive review of systems was negative except for: Constitutional: positive for fatigue Neurological: positive for paresthesia   PHYSICAL EXAMINATION: General appearance: alert, cooperative, fatigued and no distress Head: Normocephalic, without obvious  abnormality, atraumatic Neck: no adenopathy Lymph nodes: Cervical, supraclavicular, and axillary nodes normal. Resp: clear to auscultation bilaterally Cardio: regular rate and rhythm, S1, S2 normal, no murmur, click, rub or gallop GI: soft, non-tender; bowel sounds normal; no masses,  no organomegaly Extremities: extremities normal, atraumatic, no cyanosis or edema  ECOG PERFORMANCE STATUS: 1 - Symptomatic but completely ambulatory  Blood pressure 183/89, pulse 69, temperature 97 F (36.1 C), temperature source Oral, resp. rate 20, height 5\' 4"  (1.626 m), weight 130 lb 1.6 oz (59.013 kg).  LABORATORY DATA: Lab Results  Component Value Date   WBC 6.9 04/27/2013   HGB 13.6 04/27/2013   HCT 40.5 04/27/2013   MCV 89.5 04/27/2013   PLT 183 04/27/2013      Chemistry      Component Value Date/Time   NA 145 04/27/2013 1050   NA 136 05/20/2012 1416   NA 142 03/08/2011 0754   K 3.5 04/27/2013 1050   K 4.4 05/20/2012 1416   K 4.2 03/08/2011 0754   CL 107 03/30/2013 1036   CL 99 05/20/2012 1416   CL 102 03/08/2011 0754   CO2 24 04/27/2013 1050   CO2 26 05/20/2012 1416   CO2 27 03/08/2011 0754   BUN 9.4 04/27/2013 1050   BUN 6 05/20/2012 1416   BUN 9 03/08/2011 0754   CREATININE 1.0 04/27/2013 1050   CREATININE 1.11* 05/20/2012 1416   CREATININE 1.0 03/08/2011 0754      Component Value Date/Time   CALCIUM 9.5 04/27/2013 1050   CALCIUM 10.3 05/20/2012 1416   CALCIUM 9.5 03/08/2011 0754   ALKPHOS 70 04/27/2013 1050   ALKPHOS 71 05/20/2012 1416   ALKPHOS 59 03/08/2011 0754   AST 16 04/27/2013 1050   AST 9 05/20/2012 1416   AST 19 03/08/2011 0754   ALT 15 04/27/2013 1050   ALT <5 05/20/2012 1416   BILITOT 0.43 04/27/2013 1050   BILITOT 0.7 05/20/2012 1416   BILITOT 0.50 03/08/2011 0754       RADIOGRAPHIC STUDIES: No results found.  ASSESSMENT AND PLAN: This is a very pleasant 72 years old [place] American female with recurrent non-small cell lung cancer, adenocarcinoma currently on maintenance treatment with  Avastin status post 29 cycles. She is tolerating her treatment fairly well but the recent urine protein dipstick showed urine protein of 2000 with a protein/creatinine ratio of 4.02.  She also has uncontrolled hypertension today. I recommended for the patient to hold her maintenance Avastin for now. I would see her back for followup visit in 2 weeks for reevaluation with repeat urine protein study. She was also advised to contact her primary care physician for adjustment of her antihypertensive medication. The patient was advised to call immediately if she has any concerning symptoms in the interval.  All questions were answered. The patient knows to call the clinic with any problems, questions or concerns. We can certainly see the patient much  sooner if necessary.

## 2013-04-29 NOTE — Telephone Encounter (Signed)
Per staff message and POF I have scheduled appts.  JMW  

## 2013-04-29 NOTE — Progress Notes (Signed)
Pt stated that she feels depressed.  She states she has a hx of feeling depressed off and on.  She is depressed about not being able to get tx today and not knowing what the outcome will be.  Asked her if she wanted to speak with someone.  She said yes.  Called Kathrin Penner, Child psychotherapist. She will be contacting pt.  SLJ

## 2013-04-29 NOTE — Progress Notes (Signed)
04/29/2013 See Telephone call encounter dated 04/28/2013. Patient in to clinic today for evaluation. Due to [facility] ratio greater than 3.5, treatment with bevacizumab will be held today. Of note, patient was due for treatment last week, but appointments were delayed by one week to accomodate the patient's planned vacation. In addition to patient complaints as reported yesterday, patient states that her Celexa was changed to a different formulation/manufacturer and that she has experienced significant dizziness as a result (grade 2), so she has stopped taking the medication a couple of weeks ago.  Per discussion with Dr. Arbutus Ped, treatment will be held per protocol. Will proceed with 9-week CT scans on July 28th followed by appointment with Dr. Arbutus Ped to re-evaluate disease status and treatment toxicity. Patient is in agreement with this plan.  Cindy S. Clelia Croft BSN, RN, CCRP 04/29/2013 11:50 AM

## 2013-04-29 NOTE — Telephone Encounter (Signed)
gv and printed appt sched and avs forlpt.... MW added tx...  °

## 2013-05-01 NOTE — Patient Instructions (Signed)
The urine protein is elevated today. I would hold the treatment with Avastin for now. Followup visit in 2 weeks

## 2013-05-10 ENCOUNTER — Other Ambulatory Visit (HOSPITAL_BASED_OUTPATIENT_CLINIC_OR_DEPARTMENT_OTHER): Payer: Medicare Other | Admitting: Lab

## 2013-05-10 ENCOUNTER — Ambulatory Visit (HOSPITAL_COMMUNITY)
Admission: RE | Admit: 2013-05-10 | Discharge: 2013-05-10 | Disposition: A | Discharge status: Home / Self Care | Payer: Medicare Other | Source: Ambulatory Visit | Attending: Internal Medicine | Admitting: Internal Medicine

## 2013-05-10 DIAGNOSIS — J438 Other emphysema: Secondary | ICD-10-CM | POA: Insufficient documentation

## 2013-05-10 DIAGNOSIS — I2510 Atherosclerotic heart disease of native coronary artery without angina pectoris: Secondary | ICD-10-CM | POA: Insufficient documentation

## 2013-05-10 DIAGNOSIS — Z902 Acquired absence of lung [part of]: Secondary | ICD-10-CM | POA: Insufficient documentation

## 2013-05-10 DIAGNOSIS — C3490 Malignant neoplasm of unspecified part of unspecified bronchus or lung: Secondary | ICD-10-CM | POA: Insufficient documentation

## 2013-05-10 DIAGNOSIS — C3410 Malignant neoplasm of upper lobe, unspecified bronchus or lung: Secondary | ICD-10-CM

## 2013-05-10 DIAGNOSIS — D7389 Other diseases of spleen: Secondary | ICD-10-CM | POA: Insufficient documentation

## 2013-05-10 DIAGNOSIS — J8410 Pulmonary fibrosis, unspecified: Secondary | ICD-10-CM | POA: Insufficient documentation

## 2013-05-10 DIAGNOSIS — K769 Liver disease, unspecified: Secondary | ICD-10-CM | POA: Insufficient documentation

## 2013-05-10 DIAGNOSIS — I700 Atherosclerosis of aorta: Secondary | ICD-10-CM | POA: Insufficient documentation

## 2013-05-10 LAB — CBC WITH DIFFERENTIAL/PLATELET
Basophils Absolute: 0.1 10*3/uL (ref 0.0–0.1)
EOS%: 2.2 % (ref 0.0–7.0)
Eosinophils Absolute: 0.2 10*3/uL (ref 0.0–0.5)
HGB: 13.6 g/dL (ref 11.6–15.9)
LYMPH%: 20.7 % (ref 14.0–49.7)
MCH: 30.4 pg (ref 25.1–34.0)
MCV: 89.3 fL (ref 79.5–101.0)
MONO%: 6.9 % (ref 0.0–14.0)
NEUT#: 5.4 10*3/uL (ref 1.5–6.5)
NEUT%: 69.1 % (ref 38.4–76.8)
Platelets: 143 10*3/uL — ABNORMAL LOW (ref 145–400)
RDW: 14.6 % — ABNORMAL HIGH (ref 11.2–14.5)

## 2013-05-10 LAB — COMPREHENSIVE METABOLIC PANEL (CC13)
ALT: <6 U/L (ref 0–55)
AST: 12 U/L (ref 5–34)
Albumin: 3.4 g/dL — ABNORMAL LOW (ref 3.5–5.0)
Calcium: 9.9 mg/dL (ref 8.4–10.4)
Chloride: 107 mEq/L (ref 98–109)
Potassium: 3.4 mEq/L — ABNORMAL LOW (ref 3.5–5.1)
Total Protein: 6.5 g/dL (ref 6.4–8.3)

## 2013-05-10 LAB — PROTEIN / CREATININE RATIO, URINE: Total Protein, Urine: 862 mg/dL

## 2013-05-11 ENCOUNTER — Telehealth: Payer: Self-pay | Admitting: Internal Medicine

## 2013-05-11 ENCOUNTER — Other Ambulatory Visit: Payer: Medicare Other | Admitting: Lab

## 2013-05-11 ENCOUNTER — Telehealth: Payer: Self-pay | Admitting: *Deleted

## 2013-05-11 ENCOUNTER — Ambulatory Visit (HOSPITAL_BASED_OUTPATIENT_CLINIC_OR_DEPARTMENT_OTHER): Payer: Medicare Other

## 2013-05-11 ENCOUNTER — Encounter: Payer: Medicare Other | Admitting: *Deleted

## 2013-05-11 ENCOUNTER — Encounter: Payer: Self-pay | Admitting: Internal Medicine

## 2013-05-11 ENCOUNTER — Ambulatory Visit (HOSPITAL_BASED_OUTPATIENT_CLINIC_OR_DEPARTMENT_OTHER): Payer: Medicare Other | Admitting: Internal Medicine

## 2013-05-11 DIAGNOSIS — C3410 Malignant neoplasm of upper lobe, unspecified bronchus or lung: Secondary | ICD-10-CM

## 2013-05-11 DIAGNOSIS — Z5112 Encounter for antineoplastic immunotherapy: Secondary | ICD-10-CM

## 2013-05-11 DIAGNOSIS — G609 Hereditary and idiopathic neuropathy, unspecified: Secondary | ICD-10-CM

## 2013-05-11 MED ORDER — SODIUM CHLORIDE 0.9 % IV SOLN
15.0000 mg/kg | Freq: Once | INTRAVENOUS | Status: AC
  Administered 2013-05-11: 875 mg via INTRAVENOUS
  Filled 2013-05-11: qty 35

## 2013-05-11 MED ORDER — HEPARIN SOD (PORK) LOCK FLUSH 100 UNIT/ML IV SOLN
500.0000 [IU] | Freq: Once | INTRAVENOUS | Status: AC | PRN
  Administered 2013-05-11: 500 [IU]
  Filled 2013-05-11: qty 5

## 2013-05-11 MED ORDER — SODIUM CHLORIDE 0.9 % IJ SOLN
10.0000 mL | INTRAMUSCULAR | Status: DC | PRN
  Administered 2013-05-11: 10 mL
  Filled 2013-05-11: qty 10

## 2013-05-11 MED ORDER — SODIUM CHLORIDE 0.9 % IV SOLN
Freq: Once | INTRAVENOUS | Status: AC
  Administered 2013-05-11: 14:00:00 via INTRAVENOUS

## 2013-05-11 MED ORDER — SODIUM CHLORIDE 0.9 % IV SOLN
Freq: Once | INTRAVENOUS | Status: AC
  Administered 2013-05-11: 15:00:00 via INTRAVENOUS

## 2013-05-11 NOTE — Progress Notes (Signed)
[facility] [phone]   [phone]) [phone]  OFFICE PROGRESS NOTE  [name],[name], [name]  DIAGNOSIS AND STAGE:  #1 Recurrent non-small cell lung cancer consistent with adenocarcinoma. This was initially diagnosed as stage IB (T2a N0 M0) well-differentiated adenocarcinoma of bronchioalveolar type in December 2010.  #2 Acute pulmonary embolism in the right lower lobe lobar, segmental and subsegmental sized pulmonary arteries, diagnosed on 11/04/2011.   PRIOR THERAPY:  1.Status post right upper lobectomy on September 28, 2009 under the care of Dr. Edwyna Shell. The patient refused adjuvant chemotherapy at that time. She had disease recurrence in June of 2012.  2.Status post 4 cycles of systemic chemotherapy with carboplatin for an AUC of 6 and paclitaxel at 200 mg per meter squared and Avastin at 15 mg/kg according to the ECOG protocol #5508.   CURRENT THERAPY: Chemotherapy with Avastin 15 mg/kg according to the ECOG protocol #5508 status post 29 cycles.   CHEMOTHERAPY INTENT: Palliative maintenance  CURRENT # OF CHEMOTHERAPY CYCLES: 30  CURRENT ANTIEMETICS: Compazine  CURRENT SMOKING STATUS: Non-smoker  ORAL CHEMOTHERAPY AND CONSENT: None  CURRENT BISPHOSPHONATES USE: None  PAIN MANAGEMENT: No pain  NARCOTICS INDUCED CONSTIPATION: No constipation.  LIVING WILL AND CODE STATUS:  INTERVAL HISTORY: [name] 72 y.o. female returns to the clinic today for followup visit. The patient has been off treatment for the last 2 weeks because of significant proteinuria. She has repeat urine protein dipstick as well as urine protein/creatinine ratio that showed improvement in her proteinuria. She denied having any specific complaints today except for the persistent peripheral neuropathy. The patient denied having any significant chest pain, shortness breath, cough or hemoptysis. She had repeat CT scan of the chest performed recently and she is  here for evaluation and discussion of her scan results.  MEDICAL HISTORY: Past Medical History  Diagnosis Date  . Lung cancer 03/08/2011    recurrent  . Hypertension   . Hypothyroidism   . Hypercholesterolemia   . Glaucoma   . Hip pain 09/02/2011  . Foot pain 09/02/2011    ALLERGIES:  is allergic to amitriptyline; codeine; and sulfa antibiotics.  MEDICATIONS:  Current Outpatient Prescriptions  Medication Sig Dispense Refill  . amLODipine (NORVASC) 5 MG tablet Take 10 mg by mouth daily. Per [name], NP      . citalopram (CELEXA) 40 MG tablet Take 40 mg by mouth daily.        . clonazePAM (KLONOPIN) 0.5 MG tablet Take 0.5 mg by mouth 2 (two) times daily as needed. Per [name], NP      . cyanocobalamin (,VITAMIN B-12,) 1000 MCG/ML injection Inject 1,000 mcg into the muscle every 30 (thirty) days.      . dorzolamide-timolol (COSOPT) 22.3-6.8 MG/ML ophthalmic solution 2 drops 2 (two) times daily. Per [name], NP       . levothyroxine (SYNTHROID, LEVOTHROID) 50 MCG tablet Take 50 mcg by mouth 4 (four) times a week. Per [name], NP       . levothyroxine (SYNTHROID, LEVOTHROID) 75 MCG tablet Take 75 mcg by mouth 3 (three) times a week. Per [name], NP       . lidocaine-prilocaine (EMLA) cream Apply 1 application topically as needed.        Marland Kitchen lisinopril (PRINIVIL,ZESTRIL) 40 MG tablet Take 40 mg by mouth daily. Per [name], NP       . prochlorperazine (COMPAZINE) 10 MG tablet Take 10 mg by mouth every  6 (six) hours as needed.        . rosuvastatin (CRESTOR) 10 MG tablet Take 10 mg by mouth daily.         No current facility-administered medications for this visit.    SURGICAL HISTORY:  Past Surgical History  Procedure Laterality Date  . Video assisted thoracoscopy  09/28/2009  . Thoracotomy  09/28/2009    mini  . Lung lobectomy  09/28/2009    right upper    REVIEW OF SYSTEMS:  A comprehensive review of systems was negative except for:  Constitutional: positive for fatigue Neurological: positive for paresthesia   PHYSICAL EXAMINATION: General appearance: alert, cooperative and no distress Head: Normocephalic, without obvious abnormality, atraumatic Neck: no adenopathy Lymph nodes: Cervical, supraclavicular, and axillary nodes normal. Resp: clear to auscultation bilaterally Cardio: regular rate and rhythm, S1, S2 normal, no murmur, click, rub or gallop GI: soft, non-tender; bowel sounds normal; no masses,  no organomegaly Extremities: extremities normal, atraumatic, no cyanosis or edema Neurologic: Alert and oriented X 3, normal strength and tone. Normal symmetric reflexes. Normal coordination and gait  ECOG PERFORMANCE STATUS: 1 - Symptomatic but completely ambulatory  Blood pressure 169/75, pulse 83, temperature 97.5 F (36.4 C), temperature source Oral, resp. rate 20, height 5\' 4"  (1.626 m), weight 129 lb 6.4 oz (58.695 kg).  LABORATORY DATA: Lab Results  Component Value Date   WBC 7.8 05/10/2013   HGB 13.6 05/10/2013   HCT 40.1 05/10/2013   MCV 89.3 05/10/2013   PLT 143* 05/10/2013      Chemistry      Component Value Date/Time   NA 144 05/10/2013 0850   NA 136 05/20/2012 1416   NA 142 03/08/2011 0754   K 3.4* 05/10/2013 0850   K 4.4 05/20/2012 1416   K 4.2 03/08/2011 0754   CL 107 03/30/2013 1036   CL 99 05/20/2012 1416   CL 102 03/08/2011 0754   CO2 26 05/10/2013 0850   CO2 26 05/20/2012 1416   CO2 27 03/08/2011 0754   BUN 11.7 05/10/2013 0850   BUN 6 05/20/2012 1416   BUN 9 03/08/2011 0754   CREATININE 1.1 05/10/2013 0850   CREATININE 1.11* 05/20/2012 1416   CREATININE 1.0 03/08/2011 0754      Component Value Date/Time   CALCIUM 9.9 05/10/2013 0850   CALCIUM 10.3 05/20/2012 1416   CALCIUM 9.5 03/08/2011 0754   ALKPHOS 66 05/10/2013 0850   ALKPHOS 71 05/20/2012 1416   ALKPHOS 59 03/08/2011 0754   AST 12 05/10/2013 0850   AST 9 05/20/2012 1416   AST 19 03/08/2011 0754   ALT <6 Repeated and Verified 05/10/2013 0850   ALT <5  05/20/2012 1416   ALT 18 03/08/2011 0754   BILITOT 0.58 05/10/2013 0850   BILITOT 0.7 05/20/2012 1416   BILITOT 0.50 03/08/2011 0754       RADIOGRAPHIC STUDIES: Ct Chest Wo Contrast  05/10/2013   *RADIOLOGY REPORT*  Clinical Data: History of lung cancer.  Restaging scan.  RECIST protocol.  CT CHEST WITHOUT CONTRAST  Technique:  Multidetector CT imaging of the chest was performed following the standard protocol without IV contrast.  Comparison: Chest CT 03/03/2013.  RECIST Protocol 1.1 Report:  Protocol 1.1 Lesions:  1. Posterior right lower lobe (image 43 of series 5), 1.5 x 1.5 cm, unchanged.  2. Superior segment left lower lobe (image 26 of series 5) similar in size measuring 4.5 x 5.3 cm.  Findings:  Mediastinum: Heart size is normal. There is no significant  pericardial fluid, thickening or pericardial calcification. There is atherosclerosis of the thoracic aorta, the great vessels of the mediastinum and the coronary arteries, including calcified atherosclerotic plaque in the left main, left anterior descending, left circumflex and right coronary arteries. Numerous calcified left hilar lymph nodes. No pathologically enlarged mediastinal or hilar lymph nodes. Please note that accurate exclusion of hilar adenopathy is limited on noncontrast CT scans.  Esophagus is unremarkable in appearance.  Right internal jugular PermCath with tip terminating at the superior cavoatrial junction.  Lungs/Pleura: When compared to prior examinations the RECIST protocol #1 lesion in the posterior aspect of the right lower lobe is completely unchanged measuring 1.5 x 1.5 cm.  The RECIST protocol #2 lesion in the superior segment of the left lower lobe appears essentially unchanged within measurement error compared to the recent prior study, measuring 4.5 x 5.3 cm on today's examination, however, when compared with multiple more remote prior examinations this lesion has clearly progressively slowly increased in size. There is a non  target lesion in the medial aspect of the right lower lobe which appears unchanged compared to recent prior examinations, measuring 1.5 x 3.1 cm, predominately ground-glass and attenuation.  Multiple additional scattered foci of ground- glass attenuation are seen throughout the lungs bilaterally, similar to prior studies.  Postoperative changes of right upper lobectomy are noted.  Multiple calcified granulomas are noted, particularly in the posterior aspect of the left upper lobe.  No acute consolidative airspace disease.  No pleural effusions.  Mild to moderate centrilobular emphysema is noted.  Upper Abdomen: Numerous calcified granulomas throughout the liver and spleen.  Musculoskeletal: There are no aggressive appearing lytic or blastic lesions noted in the visualized portions of the skeleton.  IMPRESSION: 1.  No significant change in the RECIST protocol lesions compared to the recent prior examination, as above.  However, protocol lesion #2 has clearly progressively increased in size over numerous prior examinations.  Non target lesions are also very similar to the prior examinations. 2.  Status post right upper lobectomy. 3. Atherosclerosis, including left main and three-vessel coronary artery disease.  Assessment for potential risk factor modification, dietary therapy or pharmacologic therapy may be warranted, if clinically indicated. 4.  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 5.  Mild to moderate centrilobular emphysema.   Original Report Authenticated By: Trudie Reed, M.D.    ASSESSMENT AND PLAN: This is a very pleasant 72 years old African American female with recurrent non-small cell lung cancer currently on maintenance treatment with Avastin status post 29 cycles. The patient is doing fine today and her proteinuria has improved. She has no evidence for disease progression on his recent scan especially after calculating the RECIST criteria now compared to before starting her initial  treatment. I recommended for the patient to continue her current maintenance therapy with Avastin. She would come back for followup visit in 3 weeks with the next cycle of chemotherapy. She was advised to call immediately if she has any concerning symptoms in the interval.  The patient voices understanding of current disease status and treatment options and is in agreement with the current care plan.  All questions were answered. The patient knows to call the clinic with any problems, questions or concerns. We can certainly see the patient much sooner if necessary.  I spent 15 minutes counseling the patient face to face. The total time spent in the appointment was 25 minutes.

## 2013-05-11 NOTE — Telephone Encounter (Signed)
called pt and left message regarding chemo on 8/19 after md visit

## 2013-05-11 NOTE — Progress Notes (Signed)
05/11/2013 Patient in to clinic today for evaluation prior to receiving maintenance treatment cycle 30.  Treatment has been delayed x 3 weeks for the following reasons: - one week delay from 7/9 to 7/16 due to patient's vacation - two week delay from 7/16 due to proteinuria and expected scans around 05/06/2013  Based on lab results review and history and physical exam by Dr. Arbutus Ped today, patient condition is acceptable for continued treatment. Sign for infusion given to Tommy Medal RN, including review of urine protein-creatinine ratio less than 3.5, which is acceptable for treatment.  Cindy S. Clelia Croft BSN, RN, CCRP 05/11/2013 2:12 PM

## 2013-05-11 NOTE — Telephone Encounter (Signed)
Gave pt appt for lab and MD, emailed Marcelino Duster regarding chemo on August 2014

## 2013-05-11 NOTE — Telephone Encounter (Signed)
Per staff message and POF I have scheduled appts.  JMW  

## 2013-05-11 NOTE — Patient Instructions (Addendum)
[facility] Discharge Instructions for Patients Receiving Chemotherapy  Today you received the following chemotherapy agents Avastin.  To help prevent nausea and vomiting after your treatment, we encourage you to take your nausea medication as prescribed.   If you develop nausea and vomiting that is not controlled by your nausea medication, call the clinic.   BELOW ARE SYMPTOMS THAT SHOULD BE REPORTED IMMEDIATELY:  *FEVER GREATER THAN 100.5 F  *CHILLS WITH OR WITHOUT FEVER  NAUSEA AND VOMITING THAT IS NOT CONTROLLED WITH YOUR NAUSEA MEDICATION  *UNUSUAL SHORTNESS OF BREATH  *UNUSUAL BRUISING OR BLEEDING  TENDERNESS IN MOUTH AND THROAT WITH OR WITHOUT PRESENCE OF ULCERS  *URINARY PROBLEMS  *BOWEL PROBLEMS  UNUSUAL RASH Items with * indicate a potential emergency and should be followed up as soon as possible.  Feel free to call the clinic you have any questions or concerns. The clinic phone number is [phone].    

## 2013-05-11 NOTE — Patient Instructions (Signed)
Continue maintenance Avastin. Followup visit in 3 weeks

## 2013-05-12 ENCOUNTER — Ambulatory Visit: Payer: Medicare Other

## 2013-05-19 ENCOUNTER — Ambulatory Visit: Payer: Medicare Other

## 2013-05-31 ENCOUNTER — Other Ambulatory Visit (HOSPITAL_BASED_OUTPATIENT_CLINIC_OR_DEPARTMENT_OTHER): Payer: Medicare Other | Admitting: Lab

## 2013-05-31 ENCOUNTER — Ambulatory Visit: Payer: Medicare Other

## 2013-05-31 DIAGNOSIS — C3410 Malignant neoplasm of upper lobe, unspecified bronchus or lung: Secondary | ICD-10-CM

## 2013-05-31 LAB — CBC WITH DIFFERENTIAL/PLATELET
Basophils Absolute: 0.1 10*3/uL (ref 0.0–0.1)
EOS%: 2.6 % (ref 0.0–7.0)
Eosinophils Absolute: 0.2 10*3/uL (ref 0.0–0.5)
HCT: 41.1 % (ref 34.8–46.6)
HGB: 13.8 g/dL (ref 11.6–15.9)
LYMPH%: 21.3 % (ref 14.0–49.7)
MCH: 29.9 pg (ref 25.1–34.0)
MCV: 89.4 fL (ref 79.5–101.0)
MONO%: 8.8 % (ref 0.0–14.0)
NEUT#: 6 10*3/uL (ref 1.5–6.5)
NEUT%: 66.4 % (ref 38.4–76.8)
Platelets: 184 10*3/uL (ref 145–400)

## 2013-05-31 LAB — COMPREHENSIVE METABOLIC PANEL (CC13)
AST: 13 U/L (ref 5–34)
Albumin: 3.1 g/dL — ABNORMAL LOW (ref 3.5–5.0)
Alkaline Phosphatase: 79 U/L (ref 40–150)
BUN: 11.6 mg/dL (ref 7.0–26.0)
Creatinine: 1.1 mg/dL (ref 0.6–1.1)
Glucose: 91 mg/dl (ref 70–140)
Potassium: 3.6 mEq/L (ref 3.5–5.1)
Total Bilirubin: 0.55 mg/dL (ref 0.20–1.20)

## 2013-05-31 LAB — UA PROTEIN, DIPSTICK - CHCC: Protein, ur: 300 mg/dL

## 2013-05-31 LAB — PROTEIN / CREATININE RATIO, URINE
Creatinine, Urine: 193.3 mg/dL
Total Protein, Urine: 1206 mg/dL

## 2013-05-31 LAB — MAGNESIUM (CC13): Magnesium: 2.3 mg/dl (ref 1.5–2.5)

## 2013-06-01 ENCOUNTER — Ambulatory Visit (HOSPITAL_BASED_OUTPATIENT_CLINIC_OR_DEPARTMENT_OTHER): Payer: Medicare Other | Admitting: Internal Medicine

## 2013-06-01 ENCOUNTER — Other Ambulatory Visit: Payer: Medicare Other | Admitting: Lab

## 2013-06-01 ENCOUNTER — Telehealth: Payer: Self-pay | Admitting: Internal Medicine

## 2013-06-01 ENCOUNTER — Ambulatory Visit: Payer: Medicare Other

## 2013-06-01 ENCOUNTER — Encounter: Payer: Medicare Other | Admitting: *Deleted

## 2013-06-01 ENCOUNTER — Encounter: Payer: Self-pay | Admitting: Internal Medicine

## 2013-06-01 ENCOUNTER — Telehealth: Payer: Self-pay | Admitting: *Deleted

## 2013-06-01 DIAGNOSIS — M7989 Other specified soft tissue disorders: Secondary | ICD-10-CM

## 2013-06-01 DIAGNOSIS — C3410 Malignant neoplasm of upper lobe, unspecified bronchus or lung: Secondary | ICD-10-CM

## 2013-06-01 MED ORDER — FUROSEMIDE 20 MG PO TABS: 20.0000 mg | ORAL_TABLET | Freq: Every day | ORAL | Status: DC

## 2013-06-01 MED ORDER — POTASSIUM CHLORIDE ER 10 MEQ PO TBCR: 20.0000 meq | EXTENDED_RELEASE_TABLET | Freq: Two times a day (BID) | ORAL | Status: DC

## 2013-06-01 NOTE — Telephone Encounter (Signed)
Per staff message and POF I have scheduled appts.  JMW  

## 2013-06-01 NOTE — Telephone Encounter (Signed)
Gave pt appt for lab and ML, emailed Marcelino Duster regarding chemo on August 27th

## 2013-06-01 NOTE — Patient Instructions (Signed)
CURRENT THERAPY: Chemotherapy with Avastin 15 mg/kg according to the ECOG protocol #5508 status post 30  cycles.   CHEMOTHERAPY INTENT: Palliative/maintenance  CURRENT # OF CHEMOTHERAPY CYCLES: 30  CURRENT ANTIEMETICS: Compazine  CURRENT SMOKING STATUS: Non-smoker  ORAL CHEMOTHERAPY AND CONSENT: None  CURRENT BISPHOSPHONATES USE: None  PAIN MANAGEMENT: No pain  NARCOTICS INDUCED CONSTIPATION: No constipation.  LIVING WILL AND CODE STATUS: Full code

## 2013-06-01 NOTE — Progress Notes (Signed)
06/01/2013 Patient in to clinic this morning for evaluation prior to receiving maintenance treatment Cycle 31. Due to elevated urine protein obtained on Day -1, patient, treatment will be held today per protocol section 5.4.1.2. Patient will return to clinic next week for re-evaluation and further treatment decisions. Patient is in agreement with this plan. Cindy S. Clelia Croft BSN, RN, CCRP 06/01/2013 11:01 AM

## 2013-06-01 NOTE — Progress Notes (Signed)
[facility] Telephone:([phone]   Fax:(336) [phone]  OFFICE PROGRESS NOTE  [name],[name], [name]  DIAGNOSIS AND STAGE:  #1 Recurrent non-small cell lung cancer consistent with adenocarcinoma. This was initially diagnosed as stage IB (T2a N0 M0) well-differentiated adenocarcinoma of bronchioalveolar type in December 2010.  #2 Acute pulmonary embolism in the right lower lobe lobar, segmental and subsegmental sized pulmonary arteries, diagnosed on 11/04/2011.   PRIOR THERAPY:  1.Status post right upper lobectomy on September 28, 2009 under the care of Dr. Edwyna Shell. The patient refused adjuvant chemotherapy at that time. She had disease recurrence in June of 2012.  2.Status post 4 cycles of systemic chemotherapy with carboplatin for an AUC of 6 and paclitaxel at 200 mg per meter squared and Avastin at 15 mg/kg according to the ECOG protocol #5508.   CURRENT THERAPY: Chemotherapy with Avastin 15 mg/kg according to the ECOG protocol #5508 status post 30  cycles.   CHEMOTHERAPY INTENT: Palliative/maintenance  CURRENT # OF CHEMOTHERAPY CYCLES: 30  CURRENT ANTIEMETICS: Compazine  CURRENT SMOKING STATUS: Non-smoker  ORAL CHEMOTHERAPY AND CONSENT: None  CURRENT BISPHOSPHONATES USE: None  PAIN MANAGEMENT: No pain  NARCOTICS INDUCED CONSTIPATION: No constipation.  LIVING WILL AND CODE STATUS: Full code   INTERVAL HISTORY: [name] 72 y.o. female returns to the clinic today for follow p visit. The patient is feeling fine today with no specific complaints except for mild fatigue and persistent peripheral neuropathy with no significant worsening. She tolerated the last cycle of her maintenance treatment with Avastin fairly well. The patient denied having any bleeding issues. She denied having any nausea or vomiting. She denied having any significant fever or chills. The patient has no chest pain but continues to have shortness of breath with exertion with  no cough or hemoptysis.   MEDICAL HISTORY: Past Medical History  Diagnosis Date  . Lung cancer 03/08/2011    recurrent  . Hypertension   . Hypothyroidism   . Hypercholesterolemia   . Glaucoma   . Hip pain 09/02/2011  . Foot pain 09/02/2011    ALLERGIES:  is allergic to amitriptyline; codeine; and sulfa antibiotics.  MEDICATIONS:  Current Outpatient Prescriptions  Medication Sig Dispense Refill  . amLODipine (NORVASC) 5 MG tablet Take 10 mg by mouth daily. Per [name], NP      . citalopram (CELEXA) 40 MG tablet Take 40 mg by mouth daily.        . clonazePAM (KLONOPIN) 0.5 MG tablet Take 0.5 mg by mouth 2 (two) times daily as needed. Per [name], NP      . cyanocobalamin (,VITAMIN B-12,) 1000 MCG/ML injection Inject 1,000 mcg into the muscle every 30 (thirty) days.      . dorzolamide-timolol (COSOPT) 22.3-6.8 MG/ML ophthalmic solution 2 drops 2 (two) times daily. Per [name], NP       . levothyroxine (SYNTHROID, LEVOTHROID) 50 MCG tablet Take 50 mcg by mouth 4 (four) times a week. Per [name], NP       . levothyroxine (SYNTHROID, LEVOTHROID) 75 MCG tablet Take 75 mcg by mouth 3 (three) times a week. Per [name], NP       . lidocaine-prilocaine (EMLA) cream Apply 1 application topically as needed.        Marland Kitchen lisinopril (PRINIVIL,ZESTRIL) 40 MG tablet Take 40 mg by mouth daily. Per [name], NP       . prochlorperazine (COMPAZINE) 10 MG tablet Take 10 mg by mouth  every 6 (six) hours as needed.        . rosuvastatin (CRESTOR) 10 MG tablet Take 10 mg by mouth daily.         No current facility-administered medications for this visit.    SURGICAL HISTORY:  Past Surgical History  Procedure Laterality Date  . Video assisted thoracoscopy  09/28/2009  . Thoracotomy  09/28/2009    mini  . Lung lobectomy  09/28/2009    right upper    REVIEW OF SYSTEMS:  A comprehensive review of systems was negative except for: Constitutional: positive for fatigue and  Swelling of the lower extremities Respiratory: positive for dyspnea on exertion Neurological: positive for paresthesia   PHYSICAL EXAMINATION: General appearance: alert, cooperative and no distress Head: Normocephalic, without obvious abnormality, atraumatic Neck: no adenopathy Lymph nodes: Cervical, supraclavicular, and axillary nodes normal. Resp: clear to auscultation bilaterally Cardio: regular rate and rhythm, S1, S2 normal, no murmur, click, rub or gallop GI: soft, non-tender; bowel sounds normal; no masses,  no organomegaly Extremities: edema 1+  ECOG PERFORMANCE STATUS: 1 - Symptomatic but completely ambulatory  Blood pressure 157/83, pulse 84, temperature 97.5 F (36.4 C), temperature source Oral, resp. rate 20, height 5\' 4"  (1.626 m), weight 131 lb 1.6 oz (59.467 kg).  LABORATORY DATA: Lab Results  Component Value Date   WBC 9.0 05/31/2013   HGB 13.8 05/31/2013   HCT 41.1 05/31/2013   MCV 89.4 05/31/2013   PLT 184 05/31/2013      Chemistry      Component Value Date/Time   NA 145 05/31/2013 0823   NA 136 05/20/2012 1416   NA 142 03/08/2011 0754   K 3.6 05/31/2013 0823   K 4.4 05/20/2012 1416   K 4.2 03/08/2011 0754   CL 107 03/30/2013 1036   CL 99 05/20/2012 1416   CL 102 03/08/2011 0754   CO2 27 05/31/2013 0823   CO2 26 05/20/2012 1416   CO2 27 03/08/2011 0754   BUN 11.6 05/31/2013 0823   BUN 6 05/20/2012 1416   BUN 9 03/08/2011 0754   CREATININE 1.1 05/31/2013 0823   CREATININE 1.11* 05/20/2012 1416   CREATININE 1.0 03/08/2011 0754      Component Value Date/Time   CALCIUM 9.7 05/31/2013 0823   CALCIUM 10.3 05/20/2012 1416   CALCIUM 9.5 03/08/2011 0754   ALKPHOS 79 05/31/2013 0823   ALKPHOS 71 05/20/2012 1416   ALKPHOS 59 03/08/2011 0754   AST 13 05/31/2013 0823   AST 9 05/20/2012 1416   AST 19 03/08/2011 0754   ALT 10 05/31/2013 0823   ALT <5 05/20/2012 1416   ALT 18 03/08/2011 0754   BILITOT 0.55 05/31/2013 0823   BILITOT 0.7 05/20/2012 1416   BILITOT 0.50 03/08/2011 0754        RADIOGRAPHIC STUDIES: Ct Chest Wo Contrast  05/10/2013   *RADIOLOGY REPORT*  Clinical Data: History of lung cancer.  Restaging scan.  RECIST protocol.  CT CHEST WITHOUT CONTRAST  Technique:  Multidetector CT imaging of the chest was performed following the standard protocol without IV contrast.  Comparison: Chest CT 03/03/2013.  RECIST Protocol 1.1 Report:  Protocol 1.1 Lesions:  1. Posterior right lower lobe (image 43 of series 5), 1.5 x 1.5 cm, unchanged.  2. Superior segment left lower lobe (image 26 of series 5) similar in size measuring 4.5 x 5.3 cm.  Findings:  Mediastinum: Heart size is normal. There is no significant pericardial fluid, thickening or pericardial calcification. There is atherosclerosis of the thoracic  aorta, the great vessels of the mediastinum and the coronary arteries, including calcified atherosclerotic plaque in the left main, left anterior descending, left circumflex and right coronary arteries. Numerous calcified left hilar lymph nodes. No pathologically enlarged mediastinal or hilar lymph nodes. Please note that accurate exclusion of hilar adenopathy is limited on noncontrast CT scans.  Esophagus is unremarkable in appearance.  Right internal jugular PermCath with tip terminating at the superior cavoatrial junction.  Lungs/Pleura: When compared to prior examinations the RECIST protocol #1 lesion in the posterior aspect of the right lower lobe is completely unchanged measuring 1.5 x 1.5 cm.  The RECIST protocol #2 lesion in the superior segment of the left lower lobe appears essentially unchanged within measurement error compared to the recent prior study, measuring 4.5 x 5.3 cm on today's examination, however, when compared with multiple more remote prior examinations this lesion has clearly progressively slowly increased in size. There is a non target lesion in the medial aspect of the right lower lobe which appears unchanged compared to recent prior examinations, measuring  1.5 x 3.1 cm, predominately ground-glass and attenuation.  Multiple additional scattered foci of ground- glass attenuation are seen throughout the lungs bilaterally, similar to prior studies.  Postoperative changes of right upper lobectomy are noted.  Multiple calcified granulomas are noted, particularly in the posterior aspect of the left upper lobe.  No acute consolidative airspace disease.  No pleural effusions.  Mild to moderate centrilobular emphysema is noted.  Upper Abdomen: Numerous calcified granulomas throughout the liver and spleen.  Musculoskeletal: There are no aggressive appearing lytic or blastic lesions noted in the visualized portions of the skeleton.  IMPRESSION: 1.  No significant change in the RECIST protocol lesions compared to the recent prior examination, as above.  However, protocol lesion #2 has clearly progressively increased in size over numerous prior examinations.  Non target lesions are also very similar to the prior examinations. 2.  Status post right upper lobectomy. 3. Atherosclerosis, including left main and three-vessel coronary artery disease.  Assessment for potential risk factor modification, dietary therapy or pharmacologic therapy may be warranted, if clinically indicated. 4.  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 5.  Mild to moderate centrilobular emphysema.   Original Report Authenticated By: Trudie Reed, M.D.    ASSESSMENT AND PLAN: this is a very pleasant 72 years old African American female with recurrent non-small cell lung cancer currently on maintenance treatment with Avastin status post 30 cycles.  She is tolerating her treatment fairly well but the urine protein dipstick as well as the urine protein/creatinine ratio are elevated. Her urine protein dipstick was 300 and urine creatinine/protein ratio 6.24. I recommended for the patient to hold her current treatment with Avastin for now. She would come back for follow up visit in one week for  reevaluation with repeat urine protein study. For the lower extremity edema, I started the patient on Lasix 20 mg by mouth daily when necessary swelling and I also gave the patient potassium supplement with potassium chloride 20 mEq by mouth daily She was advised to call immediately if she has any concerning symptoms in the interval The patient voices understanding of current disease status and treatment options and is in agreement with the current care plan.  All questions were answered. The patient knows to call the clinic with any problems, questions or concerns. We can certainly see the patient much sooner if necessary.

## 2013-06-02 ENCOUNTER — Telehealth: Payer: Self-pay | Admitting: Internal Medicine

## 2013-06-02 NOTE — Telephone Encounter (Signed)
called pt left message regarding lab, md and chemo for August 2014

## 2013-06-08 ENCOUNTER — Other Ambulatory Visit (HOSPITAL_BASED_OUTPATIENT_CLINIC_OR_DEPARTMENT_OTHER): Payer: Medicare Other | Admitting: Lab

## 2013-06-08 DIAGNOSIS — C3490 Malignant neoplasm of unspecified part of unspecified bronchus or lung: Secondary | ICD-10-CM

## 2013-06-08 LAB — CBC WITH DIFFERENTIAL/PLATELET
BASO%: 0.5 % (ref 0.0–2.0)
EOS%: 2.1 % (ref 0.0–7.0)
HCT: 43.2 % (ref 34.8–46.6)
LYMPH%: 22.7 % (ref 14.0–49.7)
MCH: 29.6 pg (ref 25.1–34.0)
MCHC: 32.9 g/dL (ref 31.5–36.0)
MCV: 90 fL (ref 79.5–101.0)
MONO%: 6.3 % (ref 0.0–14.0)
NEUT%: 68.4 % (ref 38.4–76.8)
lymph#: 2 10*3/uL (ref 0.9–3.3)

## 2013-06-08 LAB — UA PROTEIN, DIPSTICK - CHCC: Protein, ur: 300 mg/dL

## 2013-06-08 LAB — COMPREHENSIVE METABOLIC PANEL (CC13)
ALT: 11 U/L (ref 0–55)
AST: 12 U/L (ref 5–34)
Alkaline Phosphatase: 96 U/L (ref 40–150)
BUN: 10.2 mg/dL (ref 7.0–26.0)
Chloride: 105 mEq/L (ref 98–109)
Creatinine: 1.1 mg/dL (ref 0.6–1.1)
Total Bilirubin: 0.48 mg/dL (ref 0.20–1.20)

## 2013-06-08 LAB — LACTATE DEHYDROGENASE (CC13): LDH: 210 U/L (ref 125–245)

## 2013-06-08 LAB — MAGNESIUM (CC13): Magnesium: 2.4 mg/dl (ref 1.5–2.5)

## 2013-06-08 LAB — PROTEIN / CREATININE RATIO, URINE: Creatinine, Urine: 234.7 mg/dL

## 2013-06-09 ENCOUNTER — Ambulatory Visit (HOSPITAL_BASED_OUTPATIENT_CLINIC_OR_DEPARTMENT_OTHER): Payer: Medicare Other

## 2013-06-09 ENCOUNTER — Telehealth: Payer: Self-pay | Admitting: *Deleted

## 2013-06-09 ENCOUNTER — Telehealth: Payer: Self-pay | Admitting: Internal Medicine

## 2013-06-09 ENCOUNTER — Encounter: Payer: Medicare Other | Admitting: *Deleted

## 2013-06-09 ENCOUNTER — Ambulatory Visit (HOSPITAL_BASED_OUTPATIENT_CLINIC_OR_DEPARTMENT_OTHER): Payer: Medicare Other | Admitting: Physician Assistant

## 2013-06-09 DIAGNOSIS — Z86711 Personal history of pulmonary embolism: Secondary | ICD-10-CM

## 2013-06-09 DIAGNOSIS — Z5111 Encounter for antineoplastic chemotherapy: Secondary | ICD-10-CM

## 2013-06-09 DIAGNOSIS — R944 Abnormal results of kidney function studies: Secondary | ICD-10-CM

## 2013-06-09 DIAGNOSIS — C3490 Malignant neoplasm of unspecified part of unspecified bronchus or lung: Secondary | ICD-10-CM

## 2013-06-09 MED ORDER — SODIUM CHLORIDE 0.9 % IJ SOLN
10.0000 mL | INTRAMUSCULAR | Status: DC | PRN
  Administered 2013-06-09: 10 mL
  Filled 2013-06-09: qty 10

## 2013-06-09 MED ORDER — SODIUM CHLORIDE 0.9 % IV SOLN
Freq: Once | INTRAVENOUS | Status: AC
  Administered 2013-06-09: 12:00:00 via INTRAVENOUS

## 2013-06-09 MED ORDER — SODIUM CHLORIDE 0.9 % IV SOLN
Freq: Once | INTRAVENOUS | Status: AC
  Administered 2013-06-09: 13:00:00 via INTRAVENOUS

## 2013-06-09 MED ORDER — SODIUM CHLORIDE 0.9 % IV SOLN
15.0000 mg/kg | Freq: Once | INTRAVENOUS | Status: AC
  Administered 2013-06-09: 875 mg via INTRAVENOUS
  Filled 2013-06-09: qty 35

## 2013-06-09 MED ORDER — CYANOCOBALAMIN 1000 MCG/ML IJ SOLN
1000.0000 ug | Freq: Once | INTRAMUSCULAR | Status: AC
  Administered 2013-06-09: 1000 ug via INTRAMUSCULAR

## 2013-06-09 MED ORDER — HEPARIN SOD (PORK) LOCK FLUSH 100 UNIT/ML IV SOLN
500.0000 [IU] | Freq: Once | INTRAVENOUS | Status: AC | PRN
  Administered 2013-06-09: 500 [IU]
  Filled 2013-06-09: qty 5

## 2013-06-09 NOTE — Telephone Encounter (Signed)
gv and printed appt sched for opt for Sept adn avs...emailed MW to add tx.

## 2013-06-09 NOTE — Progress Notes (Signed)
06/09/2013 Patient in to clinic today for reassessment, following one week delay of maintenance bevacizumab cycle 31. Patient reports improvement in edema since taking Lasix every other day; shows continued mild (grade 1) edema. Lab results obtained on 06/08/2013 were reviewed by Dr. Truett Perna, Principal Investigator, in Dr. Asa Lente absence. Based on lab results review and history and physical exam by Tiana Loft, PA, patient condition deemed acceptable for continued treatment. Sign for infusion given to Santiago Glad RN. Cindy S. Clelia Croft BSN, RN, CCRP 06/09/2013 11:42 AM

## 2013-06-09 NOTE — Patient Instructions (Addendum)
[facility] Discharge Instructions for Patients Receiving Chemotherapy  Today you received the following chemotherapy agents; Avastin.   To help prevent nausea and vomiting after your treatment, we encourage you to take your nausea medication as directed.    If you develop nausea and vomiting that is not controlled by your nausea medication, call the clinic.   BELOW ARE SYMPTOMS THAT SHOULD BE REPORTED IMMEDIATELY:  *FEVER GREATER THAN 100.5 F  *CHILLS WITH OR WITHOUT FEVER  NAUSEA AND VOMITING THAT IS NOT CONTROLLED WITH YOUR NAUSEA MEDICATION  *UNUSUAL SHORTNESS OF BREATH  *UNUSUAL BRUISING OR BLEEDING  TENDERNESS IN MOUTH AND THROAT WITH OR WITHOUT PRESENCE OF ULCERS  *URINARY PROBLEMS  *BOWEL PROBLEMS  UNUSUAL RASH Items with * indicate a potential emergency and should be followed up as soon as possible.  Feel free to call the clinic you have any questions or concerns. The clinic phone number is [phone].    

## 2013-06-09 NOTE — Telephone Encounter (Signed)
Per staff message and POF I have scheduled appts.  JMW  

## 2013-06-09 NOTE — Progress Notes (Signed)
Dr. Truett Perna, p.i, and [place], research Rn have seen labs and have confirmed with this RN to treat per research protocol. Clayborn Heron, RN

## 2013-06-12 NOTE — Progress Notes (Signed)
[facility] [phone]   [phone]  OFFICE PROGRESS NOTE  [name],[name], [name]  DIAGNOSIS AND STAGE:  #1 Recurrent non-small cell lung cancer consistent with adenocarcinoma. This was initially diagnosed as stage IB (T2a N0 M0) well-differentiated adenocarcinoma of bronchioalveolar type in December 2010.  #2 Acute pulmonary embolism in the right lower lobe lobar, segmental and subsegmental sized pulmonary arteries, diagnosed on 11/04/2011.   PRIOR THERAPY:  1.Status post right upper lobectomy on September 28, 2009 under the care of Dr. Edwyna Shell. The patient refused adjuvant chemotherapy at that time. She had disease recurrence in June of 2012.  2.Status post 4 cycles of systemic chemotherapy with carboplatin for an AUC of 6 and paclitaxel at 200 mg per meter squared and Avastin at 15 mg/kg according to the ECOG protocol #5508.   CURRENT THERAPY: Chemotherapy with Avastin 15 mg/kg according to the ECOG protocol #5508 status post 30  cycles.   CHEMOTHERAPY INTENT: Palliative/maintenance  CURRENT # OF CHEMOTHERAPY CYCLES: 30  CURRENT ANTIEMETICS: Compazine  CURRENT SMOKING STATUS: Non-smoker  ORAL CHEMOTHERAPY AND CONSENT: None  CURRENT BISPHOSPHONATES USE: None  PAIN MANAGEMENT: No pain  NARCOTICS INDUCED CONSTIPATION: No constipation.  LIVING WILL AND CODE STATUS: Full code   INTERVAL HISTORY: [name] 72 y.o. female returns to the clinic today for follow p visit. The patient is feeling fine today with no specific complaints except for mild fatigue and persistent peripheral neuropathy with no significant worsening. Her cycle was held last week do to increase EPC ratio 6.24. She presents today to have repeat laboratory studies in clinic in another UPC ratio to see if she can proceed with her maintenance Avastin as scheduled today. The patient denied having any bleeding issues. She denied having any nausea or vomiting. She  denied having any significant fever or chills. The patient has no chest pain but continues to have shortness of breath with exertion with no cough or hemoptysis. She reports her lower extremity edema has gotten significantly better with the Lasix every other day. She is also taking potassium chloride 20 mEq daily. She does note some gastric discomfort from the potassium blood is able to tolerate this. She reports she's been taken off her Crestor and is taking red rice yeast and fish oil.  MEDICAL HISTORY: Past Medical History  Diagnosis Date  . Lung cancer 03/08/2011    recurrent  . Hypertension   . Hypothyroidism   . Hypercholesterolemia   . Glaucoma   . Hip pain 09/02/2011  . Foot pain 09/02/2011    ALLERGIES:  is allergic to amitriptyline; codeine; and sulfa antibiotics.  MEDICATIONS:  Current Outpatient Prescriptions  Medication Sig Dispense Refill  . amLODipine (NORVASC) 5 MG tablet Take 10 mg by mouth daily. Per [name], NP      . citalopram (CELEXA) 40 MG tablet Take 40 mg by mouth daily.        . clonazePAM (KLONOPIN) 0.5 MG tablet Take 0.5 mg by mouth 2 (two) times daily as needed. Per [name], NP      . cyanocobalamin (,VITAMIN B-12,) 1000 MCG/ML injection Inject 1,000 mcg into the muscle every 30 (thirty) days.      . dorzolamide-timolol (COSOPT) 22.3-6.8 MG/ML ophthalmic solution 2 drops 2 (two) times daily. Per [name], NP       . furosemide (LASIX) 20 MG tablet Take 1 tablet (20 mg total) by mouth daily. PRN swelling of the lower legs.  10 tablet  0  . levothyroxine (SYNTHROID, LEVOTHROID) 50 MCG tablet Take 50 mcg by mouth 4 (four) times a week. Per [name], NP       . levothyroxine (SYNTHROID, LEVOTHROID) 75 MCG tablet Take 75 mcg by mouth 3 (three) times a week. Per [name], NP       . lidocaine-prilocaine (EMLA) cream Apply 1 application topically as needed.        Marland Kitchen lisinopril (PRINIVIL,ZESTRIL) 40 MG tablet Take 40 mg by mouth daily. Per  [name], NP       . potassium chloride (K-DUR) 10 MEQ tablet Take 2 tablets (20 mEq total) by mouth 2 (two) times daily.  10 tablet  0  . prochlorperazine (COMPAZINE) 10 MG tablet Take 10 mg by mouth every 6 (six) hours as needed.        . rosuvastatin (CRESTOR) 10 MG tablet Take 10 mg by mouth daily.         No current facility-administered medications for this visit.    SURGICAL HISTORY:  Past Surgical History  Procedure Laterality Date  . Video assisted thoracoscopy  09/28/2009  . Thoracotomy  09/28/2009    mini  . Lung lobectomy  09/28/2009    right upper    REVIEW OF SYSTEMS:  A comprehensive review of systems was negative except for: Constitutional: positive for fatigue and Swelling of the lower extremities Respiratory: positive for dyspnea on exertion Neurological: positive for paresthesia   PHYSICAL EXAMINATION: General appearance: alert, cooperative and no distress Head: Normocephalic, without obvious abnormality, atraumatic Neck: no adenopathy Lymph nodes: Cervical, supraclavicular, and axillary nodes normal. Resp: clear to auscultation bilaterally Cardio: regular rate and rhythm, S1, S2 normal, no murmur, click, rub or gallop GI: soft, non-tender; bowel sounds normal; no masses,  no organomegaly Extremities: edema Trace to 1+  ECOG PERFORMANCE STATUS: 1 - Symptomatic but completely ambulatory  Blood pressure 152/84, pulse 75, temperature 97.5 F (36.4 C), temperature source Oral, resp. rate 18, height 5\' 4"  (1.626 m), weight 129 lb 12.8 oz (58.877 kg), SpO2 100.00%.  LABORATORY DATA: Lab Results  Component Value Date   WBC 8.6 06/08/2013   HGB 14.2 06/08/2013   HCT 43.2 06/08/2013   MCV 90.0 06/08/2013   PLT 215 06/08/2013      Chemistry      Component Value Date/Time   NA 144 06/08/2013 0934   NA 136 05/20/2012 1416   NA 142 03/08/2011 0754   K 3.7 06/08/2013 0934   K 4.4 05/20/2012 1416   K 4.2 03/08/2011 0754   CL 107 03/30/2013 1036   CL 99 05/20/2012 1416    CL 102 03/08/2011 0754   CO2 28 06/08/2013 0934   CO2 26 05/20/2012 1416   CO2 27 03/08/2011 0754   BUN 10.2 06/08/2013 0934   BUN 6 05/20/2012 1416   BUN 9 03/08/2011 0754   CREATININE 1.1 06/08/2013 0934   CREATININE 1.11* 05/20/2012 1416   CREATININE 1.0 03/08/2011 0754      Component Value Date/Time   CALCIUM 9.9 06/08/2013 0934   CALCIUM 10.3 05/20/2012 1416   CALCIUM 9.5 03/08/2011 0754   ALKPHOS 96 06/08/2013 0934   ALKPHOS 71 05/20/2012 1416   ALKPHOS 59 03/08/2011 0754   AST 12 06/08/2013 0934   AST 9 05/20/2012 1416   AST 19 03/08/2011 0754   ALT 11 06/08/2013 0934   ALT <5 05/20/2012 1416   ALT 18 03/08/2011 0754   BILITOT 0.48 06/08/2013 0934  BILITOT 0.7 05/20/2012 1416   BILITOT 0.50 03/08/2011 0754       RADIOGRAPHIC STUDIES: Ct Chest Wo Contrast  05/10/2013   *RADIOLOGY REPORT*  Clinical Data: History of lung cancer.  Restaging scan.  RECIST protocol.  CT CHEST WITHOUT CONTRAST  Technique:  Multidetector CT imaging of the chest was performed following the standard protocol without IV contrast.  Comparison: Chest CT 03/03/2013.  RECIST Protocol 1.1 Report:  Protocol 1.1 Lesions:  1. Posterior right lower lobe (image 43 of series 5), 1.5 x 1.5 cm, unchanged.  2. Superior segment left lower lobe (image 26 of series 5) similar in size measuring 4.5 x 5.3 cm.  Findings:  Mediastinum: Heart size is normal. There is no significant pericardial fluid, thickening or pericardial calcification. There is atherosclerosis of the thoracic aorta, the great vessels of the mediastinum and the coronary arteries, including calcified atherosclerotic plaque in the left main, left anterior descending, left circumflex and right coronary arteries. Numerous calcified left hilar lymph nodes. No pathologically enlarged mediastinal or hilar lymph nodes. Please note that accurate exclusion of hilar adenopathy is limited on noncontrast CT scans.  Esophagus is unremarkable in appearance.  Right internal jugular PermCath with tip  terminating at the superior cavoatrial junction.  Lungs/Pleura: When compared to prior examinations the RECIST protocol #1 lesion in the posterior aspect of the right lower lobe is completely unchanged measuring 1.5 x 1.5 cm.  The RECIST protocol #2 lesion in the superior segment of the left lower lobe appears essentially unchanged within measurement error compared to the recent prior study, measuring 4.5 x 5.3 cm on today's examination, however, when compared with multiple more remote prior examinations this lesion has clearly progressively slowly increased in size. There is a non target lesion in the medial aspect of the right lower lobe which appears unchanged compared to recent prior examinations, measuring 1.5 x 3.1 cm, predominately ground-glass and attenuation.  Multiple additional scattered foci of ground- glass attenuation are seen throughout the lungs bilaterally, similar to prior studies.  Postoperative changes of right upper lobectomy are noted.  Multiple calcified granulomas are noted, particularly in the posterior aspect of the left upper lobe.  No acute consolidative airspace disease.  No pleural effusions.  Mild to moderate centrilobular emphysema is noted.  Upper Abdomen: Numerous calcified granulomas throughout the liver and spleen.  Musculoskeletal: There are no aggressive appearing lytic or blastic lesions noted in the visualized portions of the skeleton.  IMPRESSION: 1.  No significant change in the RECIST protocol lesions compared to the recent prior examination, as above.  However, protocol lesion #2 has clearly progressively increased in size over numerous prior examinations.  Non target lesions are also very similar to the prior examinations. 2.  Status post right upper lobectomy. 3. Atherosclerosis, including left main and three-vessel coronary artery disease.  Assessment for potential risk factor modification, dietary therapy or pharmacologic therapy may be warranted, if clinically  indicated. 4.  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 5.  Mild to moderate centrilobular emphysema.   Original Report Authenticated By: Trudie Reed, M.D.    ASSESSMENT AND PLAN: this is a very pleasant 72 years old African American female with recurrent non-small cell lung cancer currently on maintenance treatment with Avastin status post 30 cycles.  She is tolerating her treatment fairly well but the urine protein dipstick as well as the urine protein/creatinine ratio are elevated. Her urine protein dipstick was 300 however the urine creatinine/protein ratio is now decreased to 3.05 down  from 6.24 last week. Patient was discussed with Dr. Truett Perna in Dr. Asa Lente absence her UPC ratio is within treatable arrange for the ECoG #5508 protocol. She will receive her maintenance Avastin today as scheduled. She'll return in 3 weeks to see Dr. Arbutus Ped with a restaging CT scan as well as her next scheduled cycle of maintenance Avastin.  Laural Benes, Brand Siever E, PA-C   She was advised to call immediately if she has any concerning symptoms in the interval The patient voices understanding of current disease status and treatment options and is in agreement with the current care plan.  All questions were answered. The patient knows to call the clinic with any problems, questions or concerns. We can certainly see the patient much sooner if necessary.

## 2013-06-12 NOTE — Patient Instructions (Addendum)
Followup in 3 weeks with restaging CT scans prior to her next scheduled cycle of maintenance Avastin

## 2013-06-29 ENCOUNTER — Other Ambulatory Visit (HOSPITAL_BASED_OUTPATIENT_CLINIC_OR_DEPARTMENT_OTHER): Payer: Medicare Other | Admitting: Lab

## 2013-06-29 DIAGNOSIS — C3490 Malignant neoplasm of unspecified part of unspecified bronchus or lung: Secondary | ICD-10-CM

## 2013-06-29 LAB — COMPREHENSIVE METABOLIC PANEL (CC13)
ALT: 8 U/L (ref 0–55)
AST: 11 U/L (ref 5–34)
CO2: 29 mEq/L (ref 22–29)
Calcium: 9.7 mg/dL (ref 8.4–10.4)
Chloride: 106 mEq/L (ref 98–109)
Potassium: 3.8 mEq/L (ref 3.5–5.1)
Sodium: 142 mEq/L (ref 136–145)
Total Protein: 6.5 g/dL (ref 6.4–8.3)

## 2013-06-29 LAB — CBC WITH DIFFERENTIAL/PLATELET
BASO%: 1.1 % (ref 0.0–2.0)
EOS%: 2.3 % (ref 0.0–7.0)
HCT: 40 % (ref 34.8–46.6)
MCH: 29.7 pg (ref 25.1–34.0)
MCHC: 33.3 g/dL (ref 31.5–36.0)
MONO#: 0.5 10*3/uL (ref 0.1–0.9)
RBC: 4.48 10*6/uL (ref 3.70–5.45)
RDW: 14.5 % (ref 11.2–14.5)
WBC: 7.4 10*3/uL (ref 3.9–10.3)
lymph#: 1.8 10*3/uL (ref 0.9–3.3)

## 2013-06-29 LAB — PROTEIN / CREATININE RATIO, URINE: Total Protein, Urine: 391 mg/dL

## 2013-06-29 LAB — LACTATE DEHYDROGENASE (CC13): LDH: 184 U/L (ref 125–245)

## 2013-06-29 LAB — UA PROTEIN, DIPSTICK - CHCC: Protein, ur: 300 mg/dL

## 2013-06-30 ENCOUNTER — Encounter: Payer: Self-pay | Admitting: Physician Assistant

## 2013-06-30 ENCOUNTER — Encounter: Payer: Medicare Other | Admitting: *Deleted

## 2013-06-30 ENCOUNTER — Ambulatory Visit (HOSPITAL_BASED_OUTPATIENT_CLINIC_OR_DEPARTMENT_OTHER): Payer: Medicare Other | Admitting: Physician Assistant

## 2013-06-30 ENCOUNTER — Ambulatory Visit (HOSPITAL_BASED_OUTPATIENT_CLINIC_OR_DEPARTMENT_OTHER): Payer: Medicare Other

## 2013-06-30 ENCOUNTER — Telehealth: Payer: Self-pay | Admitting: Internal Medicine

## 2013-06-30 DIAGNOSIS — Z5112 Encounter for antineoplastic immunotherapy: Secondary | ICD-10-CM

## 2013-06-30 DIAGNOSIS — C3410 Malignant neoplasm of upper lobe, unspecified bronchus or lung: Secondary | ICD-10-CM

## 2013-06-30 DIAGNOSIS — G609 Hereditary and idiopathic neuropathy, unspecified: Secondary | ICD-10-CM

## 2013-06-30 DIAGNOSIS — Z23 Encounter for immunization: Secondary | ICD-10-CM

## 2013-06-30 DIAGNOSIS — R5381 Other malaise: Secondary | ICD-10-CM

## 2013-06-30 DIAGNOSIS — R0602 Shortness of breath: Secondary | ICD-10-CM

## 2013-06-30 MED ORDER — SODIUM CHLORIDE 0.9 % IV SOLN
Freq: Once | INTRAVENOUS | Status: AC
  Administered 2013-06-30: 11:00:00 via INTRAVENOUS

## 2013-06-30 MED ORDER — INFLUENZA VAC SPLIT QUAD 0.5 ML IM SUSP
0.5000 mL | Freq: Once | INTRAMUSCULAR | Status: AC
  Administered 2013-06-30: 0.5 mL via INTRAMUSCULAR
  Filled 2013-06-30: qty 0.5

## 2013-06-30 MED ORDER — SODIUM CHLORIDE 0.9 % IV SOLN
Freq: Once | INTRAVENOUS | Status: AC
  Administered 2013-06-30: 12:00:00 via INTRAVENOUS

## 2013-06-30 MED ORDER — SODIUM CHLORIDE 0.9 % IV SOLN
15.0000 mg/kg | Freq: Once | INTRAVENOUS | Status: AC
  Administered 2013-06-30: 875 mg via INTRAVENOUS
  Filled 2013-06-30: qty 35

## 2013-06-30 MED ORDER — SODIUM CHLORIDE 0.9 % IJ SOLN
10.0000 mL | INTRAMUSCULAR | Status: DC | PRN
  Administered 2013-06-30: 10 mL
  Filled 2013-06-30: qty 10

## 2013-06-30 MED ORDER — HEPARIN SOD (PORK) LOCK FLUSH 100 UNIT/ML IV SOLN
500.0000 [IU] | Freq: Once | INTRAVENOUS | Status: AC | PRN
  Administered 2013-06-30: 500 [IU]
  Filled 2013-06-30: qty 5

## 2013-06-30 NOTE — Progress Notes (Addendum)
[facility] [phone]   [phone]  SHARED VISIT PROGRESS NOTE  [name],[name], [name] [phone] [place]in[place] [address]  DIAGNOSIS AND STAGE:  #1 Recurrent non-small cell lung cancer consistent with adenocarcinoma. This was initially diagnosed as stage IB (T2a N0 M0) well-differentiated adenocarcinoma of bronchioalveolar type in December 2010.  #2 Acute pulmonary embolism in the right lower lobe lobar, segmental and subsegmental sized pulmonary arteries, diagnosed on 11/04/2011.   PRIOR THERAPY:  1.Status post right upper lobectomy on September 28, 2009 under the care of Dr. Edwyna Shell. The patient refused adjuvant chemotherapy at that time. She had disease recurrence in June of 2012.  2.Status post 4 cycles of systemic chemotherapy with carboplatin for an AUC of 6 and paclitaxel at 200 mg per meter squared and Avastin at 15 mg/kg according to the ECOG protocol #5508.   CURRENT THERAPY: Chemotherapy with Avastin 15 mg/kg according to the ECOG protocol #5508 status post 31 cycles.   CHEMOTHERAPY INTENT: Palliative/maintenance  CURRENT # OF CHEMOTHERAPY CYCLES: 32 CURRENT ANTIEMETICS: Compazine  CURRENT SMOKING STATUS: Non-smoker  ORAL CHEMOTHERAPY AND CONSENT: None  CURRENT BISPHOSPHONATES USE: None  PAIN MANAGEMENT: No pain  NARCOTICS INDUCED CONSTIPATION: No constipation.  LIVING WILL AND CODE STATUS: Full code   INTERVAL HISTORY: FLORICE HINDLE 72 y.o. female returns to the clinic today for follow p visit. The patient is feeling fine today with no specific complaints except for continued mild fatigue and persistent peripheral neuropathy with no significant worsening. Her cycle was held 2 weeks ago due to an increased UPC ratio of 6.24. Last week he UPC ratio decreased to 3.05 and she did receive her treatment with Avastin. She presents today to have repeat laboratory studies in clinic in another UPC ratio to see if she can proceed with her  maintenance Avastin as scheduled today. The patient denied having any bleeding issues. She denied having any nausea or vomiting. She denied having any significant fever or chills. The patient has no chest pain but continues to have shortness of breath with exertion with no cough or hemoptysis. She requests a flu shot today.  MEDICAL HISTORY: Past Medical History  Diagnosis Date  . Lung cancer 03/08/2011    recurrent  . Hypertension   . Hypothyroidism   . Hypercholesterolemia   . Glaucoma   . Hip pain 09/02/2011  . Foot pain 09/02/2011    ALLERGIES:  is allergic to amitriptyline; codeine; and sulfa antibiotics.  MEDICATIONS:  Current Outpatient Prescriptions  Medication Sig Dispense Refill  . fish oil-omega-3 fatty acids 1000 MG capsule Take 2 g by mouth daily.      . Red Yeast Rice Extract (RED YEAST RICE PO) Take 1 capsule by mouth daily.      Marland Kitchen amLODipine (NORVASC) 5 MG tablet Take 10 mg by mouth daily. Per [name], NP      . citalopram (CELEXA) 40 MG tablet Take 40 mg by mouth daily.        . clonazePAM (KLONOPIN) 0.5 MG tablet Take 0.5 mg by mouth 2 (two) times daily as needed. Per [name], NP      . cyanocobalamin (,VITAMIN B-12,) 1000 MCG/ML injection Inject 1,000 mcg into the muscle every 30 (thirty) days.      . dorzolamide-timolol (COSOPT) 22.3-6.8 MG/ML ophthalmic solution 2 drops 2 (two) times daily. Per [name], NP       . furosemide (LASIX) 20 MG tablet Take 1 tablet (20 mg total)  by mouth daily. PRN swelling of the lower legs.  10 tablet  0  . levothyroxine (SYNTHROID, LEVOTHROID) 75 MCG tablet Take 75 mcg by mouth daily. Per [name], NP      . lidocaine-prilocaine (EMLA) cream Apply 1 application topically as needed.        Marland Kitchen lisinopril (PRINIVIL,ZESTRIL) 40 MG tablet Take 40 mg by mouth daily. Per [name], NP       . potassium chloride (K-DUR) 10 MEQ tablet Take 2 tablets (20 mEq total) by mouth 2 (two) times daily.  10 tablet  0  .  prochlorperazine (COMPAZINE) 10 MG tablet Take 10 mg by mouth every 6 (six) hours as needed.         No current facility-administered medications for this visit.    SURGICAL HISTORY:  Past Surgical History  Procedure Laterality Date  . Video assisted thoracoscopy  09/28/2009  . Thoracotomy  09/28/2009    mini  . Lung lobectomy  09/28/2009    right upper    REVIEW OF SYSTEMS:  A comprehensive review of systems was negative except for: Constitutional: positive for fatigue Respiratory: positive for dyspnea on exertion Neurological: positive for paresthesia   PHYSICAL EXAMINATION: General appearance: alert, cooperative and no distress Head: Normocephalic, without obvious abnormality, atraumatic Neck: no adenopathy Lymph nodes: Cervical, supraclavicular, and axillary nodes normal. Resp: clear to auscultation bilaterally Cardio: regular rate and rhythm, S1, S2 normal, no murmur, click, rub or gallop GI: soft, non-tender; bowel sounds normal; no masses,  no organomegaly Extremities: extremities normal, atraumatic, no cyanosis or edema  ECOG PERFORMANCE STATUS: 1 - Symptomatic but completely ambulatory  Blood pressure 154/68, pulse 68, temperature 97.7 F (36.5 C), temperature source Oral, resp. rate 20, weight 129 lb 12.8 oz (58.877 kg).  LABORATORY DATA: Lab Results  Component Value Date   WBC 7.4 06/29/2013   HGB 13.3 06/29/2013   HCT 40.0 06/29/2013   MCV 89.3 06/29/2013   PLT 153 06/29/2013      Chemistry      Component Value Date/Time   NA 142 06/29/2013 0854   NA 136 05/20/2012 1416   NA 142 03/08/2011 0754   K 3.8 06/29/2013 0854   K 4.4 05/20/2012 1416   K 4.2 03/08/2011 0754   CL 107 03/30/2013 1036   CL 99 05/20/2012 1416   CL 102 03/08/2011 0754   CO2 29 06/29/2013 0854   CO2 26 05/20/2012 1416   CO2 27 03/08/2011 0754   BUN 10.3 06/29/2013 0854   BUN 6 05/20/2012 1416   BUN 9 03/08/2011 0754   CREATININE 1.0 06/29/2013 0854   CREATININE 1.11* 05/20/2012 1416   CREATININE 1.0  03/08/2011 0754      Component Value Date/Time   CALCIUM 9.7 06/29/2013 0854   CALCIUM 10.3 05/20/2012 1416   CALCIUM 9.5 03/08/2011 0754   ALKPHOS 79 06/29/2013 0854   ALKPHOS 71 05/20/2012 1416   ALKPHOS 59 03/08/2011 0754   AST 11 06/29/2013 0854   AST 9 05/20/2012 1416   AST 19 03/08/2011 0754   ALT 8 06/29/2013 0854   ALT <5 05/20/2012 1416   ALT 18 03/08/2011 0754   BILITOT 0.51 06/29/2013 0854   BILITOT 0.7 05/20/2012 1416   BILITOT 0.50 03/08/2011 0754       RADIOGRAPHIC STUDIES: Ct Chest Wo Contrast  05/10/2013   *RADIOLOGY REPORT*  Clinical Data: History of lung cancer.  Restaging scan.  RECIST protocol.  CT CHEST WITHOUT CONTRAST  Technique:  Multidetector CT  imaging of the chest was performed following the standard protocol without IV contrast.  Comparison: Chest CT 03/03/2013.  RECIST Protocol 1.1 Report:  Protocol 1.1 Lesions:  1. Posterior right lower lobe (image 43 of series 5), 1.5 x 1.5 cm, unchanged.  2. Superior segment left lower lobe (image 26 of series 5) similar in size measuring 4.5 x 5.3 cm.  Findings:  Mediastinum: Heart size is normal. There is no significant pericardial fluid, thickening or pericardial calcification. There is atherosclerosis of the thoracic aorta, the great vessels of the mediastinum and the coronary arteries, including calcified atherosclerotic plaque in the left main, left anterior descending, left circumflex and right coronary arteries. Numerous calcified left hilar lymph nodes. No pathologically enlarged mediastinal or hilar lymph nodes. Please note that accurate exclusion of hilar adenopathy is limited on noncontrast CT scans.  Esophagus is unremarkable in appearance.  Right internal jugular PermCath with tip terminating at the superior cavoatrial junction.  Lungs/Pleura: When compared to prior examinations the RECIST protocol #1 lesion in the posterior aspect of the right lower lobe is completely unchanged measuring 1.5 x 1.5 cm.  The RECIST protocol #2 lesion  in the superior segment of the left lower lobe appears essentially unchanged within measurement error compared to the recent prior study, measuring 4.5 x 5.3 cm on today's examination, however, when compared with multiple more remote prior examinations this lesion has clearly progressively slowly increased in size. There is a non target lesion in the medial aspect of the right lower lobe which appears unchanged compared to recent prior examinations, measuring 1.5 x 3.1 cm, predominately ground-glass and attenuation.  Multiple additional scattered foci of ground- glass attenuation are seen throughout the lungs bilaterally, similar to prior studies.  Postoperative changes of right upper lobectomy are noted.  Multiple calcified granulomas are noted, particularly in the posterior aspect of the left upper lobe.  No acute consolidative airspace disease.  No pleural effusions.  Mild to moderate centrilobular emphysema is noted.  Upper Abdomen: Numerous calcified granulomas throughout the liver and spleen.  Musculoskeletal: There are no aggressive appearing lytic or blastic lesions noted in the visualized portions of the skeleton.  IMPRESSION: 1.  No significant change in the RECIST protocol lesions compared to the recent prior examination, as above.  However, protocol lesion #2 has clearly progressively increased in size over numerous prior examinations.  Non target lesions are also very similar to the prior examinations. 2.  Status post right upper lobectomy. 3. Atherosclerosis, including left main and three-vessel coronary artery disease.  Assessment for potential risk factor modification, dietary therapy or pharmacologic therapy may be warranted, if clinically indicated. 4.  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 5.  Mild to moderate centrilobular emphysema.   Original Report Authenticated By: Trudie Reed, M.D.    ASSESSMENT AND PLAN: this is a very pleasant 72 years old African American female with  recurrent non-small cell lung cancer currently on maintenance treatment with Avastin status post 31 cycles.  She is tolerating her treatment fairly well but the urine protein dipstick as well as the urine protein/creatinine ratio are elevated. Her urine protein dipstick was 300 however the urine creatinine/protein ratio is now decreased to 2.62 down from 3.05 last week. Patient was discussed with and seen by Dr. Arbutus Ped. Her UPC ratio is within treatable arrange for the ECoG #5508 protocol. She will receive her maintenance Avastin today as scheduled. She'll follow up with Dr. Arbutus Ped in 3 weeks with a restaging CT scan as well as  her next scheduled cycle of maintenance Avastin. She will be given her flu vaccine today.  Laural Benes, Rielyn Krupinski E, PA-C   She was advised to call immediately if she has any concerning symptoms in the interval The patient voices understanding of current disease status and treatment options and is in agreement with the current care plan.  All questions were answered. The patient knows to call the clinic with any problems, questions or concerns. We can certainly see the patient much sooner if necessary.  ADDENDUM:  Hematology/Oncology Attending:  I have a face to face encounter with the patient during his visit. I recommended her care plan. This is a very pleasant 72 years old [place] American female with recurrent non-small cell lung cancer. She is currently on maintenance treatment with single agent Avastin according to the ECoG protocol 5508. The patient is doing fine and her urine protein study is acceptable today. We will proceed with her maintenance treatment with Avastin as scheduled.  She would come back for follow up visit in 3 weeks with repeat CT scan of the chest for restaging of her disease. She was advised to call immediately if she has any concerning symptoms in the interval. Lajuana Matte., MD 07/04/2013

## 2013-06-30 NOTE — Telephone Encounter (Signed)
gv pt appt schedule for October. [facility] will call re ct. Pt aware. Per 9/17 pof (2nd) lb 10/7 and MM/tx 10/8.

## 2013-06-30 NOTE — Progress Notes (Signed)
06/30/2013 Patient in to clinic today for evaluation prior to receiving maintenance bevacizumab treatment cycle 32. Patient has no new complaints to report today. She continues to have mild edema, but has not taken any Lasix since her last treatment. Based on lab results review and history and physical by Tiana Loft PA, patient condition is acceptable for continued treatment. Case reviewed and patient seen by Dr. Arbutus Ped as well.  As previous scans were obtained following cycle 29 treatment, due to multiple treatment delays and per MD discretion, next scans will be obtained following current cycle of therapy, and prior to cycle 33.   Sign for infusion given to Melodie Bouillon RN.  Cindy S. Clelia Croft BSN, RN, CCRP 06/30/2013 12:02 PM

## 2013-06-30 NOTE — Patient Instructions (Signed)
Follow up in 3 weeks with a restaging CT scan of your chest to re-evaluate your disease 

## 2013-06-30 NOTE — Patient Instructions (Addendum)
[facility] Discharge Instructions for Patients Receiving Chemotherapy  Today you received the following chemotherapy agents Avastin.  To help prevent nausea and vomiting after your treatment, we encourage you to take your nausea medication Compazine 10 mg every 6 hours as needed.  BELOW ARE SYMPTOMS THAT SHOULD BE REPORTED IMMEDIATELY:  *FEVER GREATER THAN 100.5 F  *CHILLS WITH OR WITHOUT FEVER  NAUSEA AND VOMITING THAT IS NOT CONTROLLED WITH YOUR NAUSEA MEDICATION  *UNUSUAL SHORTNESS OF BREATH  *UNUSUAL BRUISING OR BLEEDING  TENDERNESS IN MOUTH AND THROAT WITH OR WITHOUT PRESENCE OF ULCERS  *URINARY PROBLEMS  *BOWEL PROBLEMS  UNUSUAL RASH Items with * indicate a potential emergency and should be followed up as soon as possible.  Feel free to call the clinic you have any questions or concerns. The clinic phone number is [phone].

## 2013-07-20 ENCOUNTER — Ambulatory Visit (HOSPITAL_COMMUNITY)
Admission: RE | Admit: 2013-07-20 | Discharge: 2013-07-20 | Disposition: A | Discharge status: Home / Self Care | Payer: Medicare Other | Source: Ambulatory Visit | Attending: Internal Medicine | Admitting: Internal Medicine

## 2013-07-20 ENCOUNTER — Other Ambulatory Visit (HOSPITAL_BASED_OUTPATIENT_CLINIC_OR_DEPARTMENT_OTHER): Payer: Medicare Other | Admitting: Lab

## 2013-07-20 DIAGNOSIS — C3490 Malignant neoplasm of unspecified part of unspecified bronchus or lung: Secondary | ICD-10-CM | POA: Insufficient documentation

## 2013-07-20 DIAGNOSIS — Z79899 Other long term (current) drug therapy: Secondary | ICD-10-CM | POA: Insufficient documentation

## 2013-07-20 DIAGNOSIS — R809 Proteinuria, unspecified: Secondary | ICD-10-CM

## 2013-07-20 DIAGNOSIS — R918 Other nonspecific abnormal finding of lung field: Secondary | ICD-10-CM | POA: Insufficient documentation

## 2013-07-20 DIAGNOSIS — I2510 Atherosclerotic heart disease of native coronary artery without angina pectoris: Secondary | ICD-10-CM | POA: Insufficient documentation

## 2013-07-20 DIAGNOSIS — C3410 Malignant neoplasm of upper lobe, unspecified bronchus or lung: Secondary | ICD-10-CM

## 2013-07-20 DIAGNOSIS — J8410 Pulmonary fibrosis, unspecified: Secondary | ICD-10-CM | POA: Insufficient documentation

## 2013-07-20 LAB — CBC WITH DIFFERENTIAL/PLATELET
BASO%: 0.9 % (ref 0.0–2.0)
LYMPH%: 25.9 % (ref 14.0–49.7)
MCHC: 33.3 g/dL (ref 31.5–36.0)
MCV: 89.1 fL (ref 79.5–101.0)
MONO#: 0.6 10*3/uL (ref 0.1–0.9)
MONO%: 7.1 % (ref 0.0–14.0)
NEUT#: 5.1 10*3/uL (ref 1.5–6.5)
Platelets: 198 10*3/uL (ref 145–400)
RBC: 4.61 10*6/uL (ref 3.70–5.45)
RDW: 14.5 % (ref 11.2–14.5)
WBC: 7.9 10*3/uL (ref 3.9–10.3)

## 2013-07-20 LAB — COMPREHENSIVE METABOLIC PANEL (CC13)
ALT: 8 U/L (ref 0–55)
AST: 12 U/L (ref 5–34)
Creatinine: 1 mg/dL (ref 0.6–1.1)
Sodium: 144 mEq/L (ref 136–145)
Total Bilirubin: 0.45 mg/dL (ref 0.20–1.20)
Total Protein: 6.8 g/dL (ref 6.4–8.3)

## 2013-07-20 LAB — UA PROTEIN, DIPSTICK - CHCC: Protein, ur: 2000 mg/dL

## 2013-07-20 LAB — LACTATE DEHYDROGENASE (CC13): LDH: 207 U/L (ref 125–245)

## 2013-07-20 LAB — PROTEIN / CREATININE RATIO, URINE: Creatinine, Urine: 108.9 mg/dL

## 2013-07-21 ENCOUNTER — Ambulatory Visit: Payer: Medicare Other

## 2013-07-21 ENCOUNTER — Ambulatory Visit (HOSPITAL_BASED_OUTPATIENT_CLINIC_OR_DEPARTMENT_OTHER): Payer: Medicare Other | Admitting: Internal Medicine

## 2013-07-21 ENCOUNTER — Encounter: Payer: Medicare Other | Admitting: *Deleted

## 2013-07-21 ENCOUNTER — Encounter: Payer: Self-pay | Admitting: *Deleted

## 2013-07-21 ENCOUNTER — Encounter: Payer: Self-pay | Admitting: Internal Medicine

## 2013-07-21 ENCOUNTER — Telehealth: Payer: Self-pay | Admitting: Internal Medicine

## 2013-07-21 DIAGNOSIS — I10 Essential (primary) hypertension: Secondary | ICD-10-CM

## 2013-07-21 DIAGNOSIS — R5381 Other malaise: Secondary | ICD-10-CM

## 2013-07-21 DIAGNOSIS — R319 Hematuria, unspecified: Secondary | ICD-10-CM

## 2013-07-21 DIAGNOSIS — C3410 Malignant neoplasm of upper lobe, unspecified bronchus or lung: Secondary | ICD-10-CM

## 2013-07-21 MED ORDER — ERLOTINIB HCL 150 MG PO TABS: 150.0000 mg | ORAL_TABLET | Freq: Every day | ORAL | Status: DC

## 2013-07-21 NOTE — Patient Instructions (Addendum)
  CURRENT THERAPY: Tarceva 150 mg by mouth daily.  CHEMOTHERAPY INTENT: Palliative.  CURRENT # OF CHEMOTHERAPY CYCLES: 1  CURRENT ANTIEMETICS: Compazine  CURRENT SMOKING STATUS: Non-smoker  ORAL CHEMOTHERAPY AND CONSENT: Tarceva and consent was signed today.  CURRENT BISPHOSPHONATES USE: None  PAIN MANAGEMENT: No pain  NARCOTICS INDUCED CONSTIPATION: No constipation.  LIVING WILL AND CODE STATUS: Full code

## 2013-07-21 NOTE — Progress Notes (Signed)
07/21/2013 Patient in to clinic today for evaluation following CT scans on 07/20/2013. Due to evidence of disease progression on CT scans, protocol therapy will be discontinued at this time. Patient is aware that she will enter the follow-up portion of the study at this time. Patient reports recurrent episodes of dizziness over a couple of days last week. Dr. Arbutus Ped feels this may be related to her increased blood pressure and has encouraged follow up with her PCP. She also reports going to see her podiatrist due to an infected toenail, and she is scheduled for a procedure to remove the large toe nails on both feet this Friday. Cindy S. Clelia Croft BSN, RN, CCRP 07/21/2013 11:31 AM

## 2013-07-21 NOTE — Progress Notes (Signed)
[facility] [phone]   Fax:([phone]  OFFICE PROGRESS NOTE  [name],[name], [name] [phone] [address] [place] [address]  DIAGNOSIS AND STAGE:  #1 Recurrent non-small cell lung cancer consistent with adenocarcinoma. This was initially diagnosed as stage IB (T2a N0 M0) well-differentiated adenocarcinoma of bronchioalveolar type in December 2010.  #2 Acute pulmonary embolism in the right lower lobe lobar, segmental and subsegmental sized pulmonary arteries, diagnosed on 11/04/2011.   PRIOR THERAPY:  1.Status post right upper lobectomy on September 28, 2009 under the care of Dr. Edwyna Shell. The patient refused adjuvant chemotherapy at that time. She had disease recurrence in June of 2012.  2.Status post 4 cycles of systemic chemotherapy with carboplatin for an AUC of 6 and paclitaxel at 200 mg per meter squared and Avastin at 15 mg/kg according to the ECOG protocol #5508.  3. Chemotherapy with Avastin 15 mg/kg according to the ECOG protocol #5508 status post 32 cycles. Last cycle was given on 06/30/2013 discontinued secondary to disease progression and persistent proteinuria.  CURRENT THERAPY: Tarceva 150 mg by mouth daily.  CHEMOTHERAPY INTENT: Palliative.  CURRENT # OF CHEMOTHERAPY CYCLES: 1  CURRENT ANTIEMETICS: Compazine  CURRENT SMOKING STATUS: Non-smoker  ORAL CHEMOTHERAPY AND CONSENT: Tarceva and consent was signed today.  CURRENT BISPHOSPHONATES USE: None  PAIN MANAGEMENT: No pain  NARCOTICS INDUCED CONSTIPATION: No constipation.  LIVING WILL AND CODE STATUS: Full code   INTERVAL HISTORY: [name] 72 y.o. female returns to the clinic today for followup visit. The patient is feeling fine today except for mild fatigue. She also had infection of the big toe and was seen by podiatrist for evaluation. She had 1-2 episodes of dizzy spells week ago but this resolved spontaneously. The patient gained around 4 pounds since her last visit. She  denied having any significant chest pain, shortness breath, cough or hemoptysis. The patient denied having any nausea or vomiting. She tolerated the last dose of Avastin fairly well with no significant adverse effects or bleeding issues. Her urine dipstick for protein showed persistent proteinuria. Her blood pressure is still uncontrolled and today was 182/80. The patient had repeat CT scan of the chest performed recently and she is here for evaluation and discussion of her scan results.  MEDICAL HISTORY: Past Medical History  Diagnosis Date  . Lung cancer 03/08/2011    recurrent  . Hypertension   . Hypothyroidism   . Hypercholesterolemia   . Glaucoma   . Hip pain 09/02/2011  . Foot pain 09/02/2011    ALLERGIES:  is allergic to amitriptyline; codeine; and sulfa antibiotics.  MEDICATIONS:  Current Outpatient Prescriptions  Medication Sig Dispense Refill  . amLODipine (NORVASC) 5 MG tablet Take 10 mg by mouth daily. Per [name], NP      . citalopram (CELEXA) 40 MG tablet Take 40 mg by mouth daily.        . clonazePAM (KLONOPIN) 0.5 MG tablet Take 0.5 mg by mouth 2 (two) times daily as needed. Per [name], NP      . cyanocobalamin (,VITAMIN B-12,) 1000 MCG/ML injection Inject 1,000 mcg into the muscle every 30 (thirty) days.      . dorzolamide-timolol (COSOPT) 22.3-6.8 MG/ML ophthalmic solution 2 drops 2 (two) times daily. Per [name], NP       . fish oil-omega-3 fatty acids 1000 MG capsule Take 2 g by mouth daily.      . furosemide (LASIX) 20 MG tablet Take 1 tablet (20 mg  total) by mouth daily. PRN swelling of the lower legs.  10 tablet  0  . levothyroxine (SYNTHROID, LEVOTHROID) 75 MCG tablet Take 75 mcg by mouth daily. Per [name], NP      . lidocaine-prilocaine (EMLA) cream Apply 1 application topically as needed.        Marland Kitchen lisinopril (PRINIVIL,ZESTRIL) 40 MG tablet Take 40 mg by mouth daily. Per [name], NP       . potassium chloride (K-DUR) 10 MEQ tablet  Take 2 tablets (20 mEq total) by mouth 2 (two) times daily.  10 tablet  0  . prochlorperazine (COMPAZINE) 10 MG tablet Take 10 mg by mouth every 6 (six) hours as needed.        . Red Yeast Rice Extract (RED YEAST RICE PO) Take 1 capsule by mouth daily.       No current facility-administered medications for this visit.    SURGICAL HISTORY:  Past Surgical History  Procedure Laterality Date  . Video assisted thoracoscopy  09/28/2009  . Thoracotomy  09/28/2009    mini  . Lung lobectomy  09/28/2009    right upper    REVIEW OF SYSTEMS:  Constitutional: positive for fatigue Eyes: negative Ears, nose, mouth, throat, and face: negative Respiratory: negative Cardiovascular: negative Gastrointestinal: negative Genitourinary:negative Integument/breast: negative Hematologic/lymphatic: negative Musculoskeletal:negative Neurological: positive for paresthesia Behavioral/Psych: negative Endocrine: negative Allergic/Immunologic: negative   PHYSICAL EXAMINATION: General appearance: alert, cooperative, fatigued and no distress Head: Normocephalic, without obvious abnormality, atraumatic Neck: no adenopathy, no JVD, supple, symmetrical, trachea midline and thyroid not enlarged, symmetric, no tenderness/mass/nodules Lymph nodes: Cervical, supraclavicular, and axillary nodes normal. Resp: clear to auscultation bilaterally Back: symmetric, no curvature. ROM normal. No CVA tenderness. Cardio: regular rate and rhythm, S1, S2 normal, no murmur, click, rub or gallop GI: soft, non-tender; bowel sounds normal; no masses,  no organomegaly Extremities: extremities normal, atraumatic, no cyanosis or edema Neurologic: Alert and oriented X 3, normal strength and tone. Normal symmetric reflexes. Normal coordination and gait  ECOG PERFORMANCE STATUS: 1 - Symptomatic but completely ambulatory  Blood pressure 182/80, pulse 84, temperature 97.8 F (36.6 C), temperature source Oral, resp. rate 19, height 5\' 4"   (1.626 m), weight 133 lb (60.328 kg), SpO2 99.00%.  LABORATORY DATA: Lab Results  Component Value Date   WBC 7.9 07/20/2013   HGB 13.7 07/20/2013   HCT 41.1 07/20/2013   MCV 89.1 07/20/2013   PLT 198 07/20/2013      Chemistry      Component Value Date/Time   NA 144 07/20/2013 1114   NA 136 05/20/2012 1416   NA 142 03/08/2011 0754   K 3.7 07/20/2013 1114   K 4.4 05/20/2012 1416   K 4.2 03/08/2011 0754   CL 107 03/30/2013 1036   CL 99 05/20/2012 1416   CL 102 03/08/2011 0754   CO2 29 07/20/2013 1114   CO2 26 05/20/2012 1416   CO2 27 03/08/2011 0754   BUN 10.2 07/20/2013 1114   BUN 6 05/20/2012 1416   BUN 9 03/08/2011 0754   CREATININE 1.0 07/20/2013 1114   CREATININE 1.11* 05/20/2012 1416   CREATININE 1.0 03/08/2011 0754      Component Value Date/Time   CALCIUM 9.9 07/20/2013 1114   CALCIUM 10.3 05/20/2012 1416   CALCIUM 9.5 03/08/2011 0754   ALKPHOS 75 07/20/2013 1114   ALKPHOS 71 05/20/2012 1416   ALKPHOS 59 03/08/2011 0754   AST 12 07/20/2013 1114   AST 9 05/20/2012 1416   AST 19 03/08/2011  0754   ALT 8 07/20/2013 1114   ALT <5 05/20/2012 1416   ALT 18 03/08/2011 0754   BILITOT 0.45 07/20/2013 1114   BILITOT 0.7 05/20/2012 1416   BILITOT 0.50 03/08/2011 0754       RADIOGRAPHIC STUDIES: Ct Chest Wo Contrast  07/20/2013   CLINICAL DATA:  History of lung cancer diagnosed in 2010. Chemotherapy in progress. RECIST protocol.  EXAM: CT CHEST WITHOUT CONTRAST  TECHNIQUE: Multidetector CT imaging of the chest was performed following the standard protocol without IV contrast.  COMPARISON:  Chest CT 05/10/2013.  FINDINGS: RECIST Protocol 1.1 Report:  Protocol 1.1 Lesions:  1. Posterior aspect of the right lower lobe (image 44 of series 5), ground-glass attenuation nodule slightly larger than the prior examination, currently measuring 2.1 x 1.9 cm. 2. Superior segment of the left lower lobe (CTs image 29 of series 5), multi-cystic and ground-glass attenuation nodule with increasing peripheral solid component or internal  debris similar in size to the prior study, currently measuring 4.1 x 5.3 cm.  Mediastinum: Multiple calcified left hilar lymph nodes. Right internal jugular porta cath with tip terminating at the superior cavoatrial junction. Heart size is normal. There is no significant pericardial fluid, thickening or pericardial calcification. No other pathologically enlarged noncalcified mediastinal or hilar lymph nodes are otherwise appreciated. Please note that accurate exclusion of hilar adenopathy is limited on noncontrast CT scans. Esophagus is unremarkable in appearance.  Lungs/Pleura: Additional non target lesions include a ground-glass attenuation nodule with no central solid component in the medial aspect of the right lower lobe (image 35 of series 5) which is similar to the prior study, currently measuring 3.1 x 1.5 cm, as well as a 1.3 x 1.0 cm ground-glass attenuation nodule in the medial aspect of the base of the right lower lobe (image 45 of series 5). There are multiple areas of architectural distortion in the lungs bilaterally, which are similar to the prior study, and favored to represent areas of scarring. Status post right upper lobectomy. Acute angulation and distortion of the junction between the right mainstem bronchus and bronchus intermedius where there is focal narrowing, similar to the prior study, best demonstrated on image 26 and 27 of series 5. No acute consolidative airspace disease. No pleural effusions. Calcified granuloma in the left upper lobe posteriorly.  Upper Abdomen: Multiple calcifications throughout the liver and spleen compatible with calcified granulomas.  Musculoskeletal: There are no aggressive appearing lytic or blastic lesions noted in the visualized portions of the skeleton.  IMPRESSION: 1. Slight interval enlargement of the ground-glass attenuation nodule in the posterior aspect of the right lower lobe (recist protocol 1.1 target lesion 1.). Target lesion 2. Is unchanged. 2. Other  non target lesions appear similar to the prior examination, as discussed above. 3. Sequela of old granulomatous disease redemonstrated. 4. Status post right upper lobectomy with similar scarring at the junction between the right mainstem bronchus and bronchus intermedius with stable moderate narrowing in this region. 5. Atherosclerosis, including left main and 3 vessel coronary artery disease. Assessment for potential risk factor modification, dietary therapy or pharmacologic therapy may be warranted, if clinically indicated. 6. Additional incidental findings, as above.   Electronically Signed   By: Trudie Reed M.D.   On: 07/20/2013 14:42    ASSESSMENT AND PLAN: This is a very pleasant 72 years old [place] American female with recurrent non-small cell lung cancer most recently treated with maintenance Avastin status post 32 cycles. Unfortunately her recent scan results showed evidence for  disease progression per the RECIST criteria for the clinical trial. The patient also has persistent proteinuria.  I have a lengthy discussion with the patient today about her current scan results and treatment options. Her treatment with Avastin will be discontinued secondary to disease progression on the clinical trial. I discussed with the patient other treatment options for her condition and I offered her treatment with single agent oral Tarceva 150 mg by mouth daily. I discussed with the patient adverse effect of this treatment including but not limited to skin rash, diarrhea, dry skin, interstitial lung disease, liver or renal dysfunction. The patient would like to proceed with treatment as planned. She signed a consent to proceed with the treatment and she was given a handout and starter kit about Tarceva. She was also advised about the management of annual the adverse effect of this treatment including the use of Imodium for diarrhea. The patient would come back for followup visit in 2 weeks for reevaluation  and management any adverse effect of her treatment. For hypertension, the patient was advised to consult with her primary care physician for adjustment of her antihypertensive medication but I expect that her hypertension would improve after discontinuation of Avastin. The patient was advised to call immediately if she has any concerning symptoms in the interval. The patient voices understanding of current disease status and treatment options and is in agreement with the current care plan.  All questions were answered. The patient knows to call the clinic with any problems, questions or concerns. We can certainly see the patient much sooner if necessary.  I spent 15 minutes counseling the patient face to face. The total time spent in the appointment was 25 minutes.

## 2013-07-21 NOTE — Telephone Encounter (Signed)
appts made cal avs to pt shh

## 2013-07-21 NOTE — Progress Notes (Signed)
07/21/2013 Notified Sherri Neva Seat, [facility] CT, via [facility] email that RECIST measurements are no longer required for this patient. Cindy S. Clelia Croft BSN, RN, CCRP 07/21/2013 11:37 AM

## 2013-07-23 ENCOUNTER — Telehealth: Payer: Self-pay | Admitting: *Deleted

## 2013-07-23 NOTE — Telephone Encounter (Signed)
sw pt gv appt for flush 08/04/13 @ 10:30am and all other flush appts. Pt is aware that i will mail her a cal as well...td

## 2013-07-26 ENCOUNTER — Telehealth: Payer: Self-pay | Admitting: *Deleted

## 2013-07-26 ENCOUNTER — Other Ambulatory Visit: Payer: Self-pay | Admitting: *Deleted

## 2013-07-26 NOTE — Telephone Encounter (Signed)
07/26/2013 Received telephone call from patient this morning with additional questions about new therapy with Tarceva. Patient states she has left a message for Dr. Asa Lente nurse and is waiting for a return call. Patient also states that she has called the company number listed in her [employer] materials and was told that she may qualify for aid, and that an application had been started but not completed; patient was encouraged to follow up with her MD regarding this. I explained to patient that the process for approval can take 1-2 weeks, but that she should follow up for a status report in a day or two, if necessary, as she also had questions regarding her out of pocket cost and if she would be expected to pay at the time the prescription is filled. Patient asked if she was still in the research study ([employer] [phone]) and I explained that she is currently in follow-up where we will report information about her condition periodically, but that the research staff is no longer involved in any of her subsequent treatment. Cindy S. Clelia Croft BSN, RN, CCRP 07/26/2013 9:35 AM

## 2013-07-26 NOTE — Telephone Encounter (Signed)
Pt called and left msg wanting a status update regarding the Tarceva.  Called [facility], they have [facility] the [facility].  They are going to call and f/u with [facility].  Called and informed patient of status.  SLJ

## 2013-07-28 NOTE — Addendum Note (Signed)
Addended by: Bernerd Pho on: 07/28/2013 11:50 AM   Modules accepted: Orders

## 2013-07-29 ENCOUNTER — Encounter: Payer: Self-pay | Admitting: Internal Medicine

## 2013-07-29 NOTE — Progress Notes (Signed)
Faxed pa form to [facility] for tarceva.

## 2013-07-30 ENCOUNTER — Encounter: Payer: Self-pay | Admitting: Internal Medicine

## 2013-07-30 NOTE — Progress Notes (Signed)
ON 07/29/13 RECEIVED A FAX FROM [facility] CONCERNING A PRIOR AUTHORIZATION FOR TARCEVA. THIS REQUEST WAS PLACED IN THE MANAGED CARE BIN.

## 2013-07-30 NOTE — Progress Notes (Signed)
[facility], [id number], approved tarceva from 07/30/13-01/27/14 [id number].

## 2013-08-04 ENCOUNTER — Other Ambulatory Visit: Payer: Medicare Other | Admitting: Lab

## 2013-08-04 ENCOUNTER — Ambulatory Visit: Payer: Medicare Other | Admitting: Physician Assistant

## 2013-08-06 ENCOUNTER — Ambulatory Visit: Payer: Medicare Other | Admitting: Physician Assistant

## 2013-08-11 ENCOUNTER — Telehealth: Payer: Self-pay | Admitting: *Deleted

## 2013-08-11 NOTE — Telephone Encounter (Signed)
Received message from pt informing nurse re:  Pt has received  Tarceva  meds today.  Pt stated she will start Tarceva on 08/12/13 since pt had already eaten today.  Message left on desk nurse for follow up. Pt's  Phone    [phone].

## 2013-08-12 MED ORDER — ERLOTINIB HCL 150 MG PO TABS: 150.0000 mg | ORAL_TABLET | Freq: Every day | ORAL | Status: DC

## 2013-08-12 NOTE — Addendum Note (Signed)
Addended by: Charma Igo on: 08/12/2013 10:08 AM   Modules accepted: Orders

## 2013-08-25 ENCOUNTER — Ambulatory Visit (HOSPITAL_BASED_OUTPATIENT_CLINIC_OR_DEPARTMENT_OTHER): Payer: Medicare Other

## 2013-08-25 ENCOUNTER — Encounter: Payer: Self-pay | Admitting: Internal Medicine

## 2013-08-25 ENCOUNTER — Other Ambulatory Visit (HOSPITAL_BASED_OUTPATIENT_CLINIC_OR_DEPARTMENT_OTHER): Payer: Medicare Other | Admitting: Lab

## 2013-08-25 ENCOUNTER — Encounter: Payer: Medicare Other | Admitting: *Deleted

## 2013-08-25 ENCOUNTER — Ambulatory Visit (HOSPITAL_BASED_OUTPATIENT_CLINIC_OR_DEPARTMENT_OTHER): Payer: Medicare Other | Admitting: Internal Medicine

## 2013-08-25 DIAGNOSIS — C3410 Malignant neoplasm of upper lobe, unspecified bronchus or lung: Secondary | ICD-10-CM

## 2013-08-25 DIAGNOSIS — R197 Diarrhea, unspecified: Secondary | ICD-10-CM

## 2013-08-25 DIAGNOSIS — R21 Rash and other nonspecific skin eruption: Secondary | ICD-10-CM

## 2013-08-25 LAB — CBC WITH DIFFERENTIAL/PLATELET
BASO%: 0.9 % (ref 0.0–2.0)
EOS%: 2.1 % (ref 0.0–7.0)
MCH: 29 pg (ref 25.1–34.0)
MCHC: 32.5 g/dL (ref 31.5–36.0)
MONO#: 0.7 10*3/uL (ref 0.1–0.9)
MONO%: 7.2 % (ref 0.0–14.0)
RBC: 4.69 10*6/uL (ref 3.70–5.45)
RDW: 14.8 % — ABNORMAL HIGH (ref 11.2–14.5)
WBC: 9.5 10*3/uL (ref 3.9–10.3)
lymph#: 1.8 10*3/uL (ref 0.9–3.3)

## 2013-08-25 LAB — COMPREHENSIVE METABOLIC PANEL (CC13)
ALT: 8 U/L (ref 0–55)
AST: 13 U/L (ref 5–34)
Alkaline Phosphatase: 76 U/L (ref 40–150)
CO2: 27 mEq/L (ref 22–29)
Calcium: 10.1 mg/dL (ref 8.4–10.4)
Chloride: 106 mEq/L (ref 98–109)
Creatinine: 1.1 mg/dL (ref 0.6–1.1)
Potassium: 3.5 mEq/L (ref 3.5–5.1)
Sodium: 143 mEq/L (ref 136–145)
Total Protein: 6.7 g/dL (ref 6.4–8.3)

## 2013-08-25 MED ORDER — CYANOCOBALAMIN 1000 MCG/ML IJ SOLN
1000.0000 ug | Freq: Once | INTRAMUSCULAR | Status: AC
  Administered 2013-08-25: 1000 ug via INTRAMUSCULAR

## 2013-08-25 NOTE — Progress Notes (Signed)
[facility] [phone]) [phone]   Fax:(336) [phone]  OFFICE PROGRESS NOTE  [name],[name], [name] [phone] [address] [place] [address]  DIAGNOSIS AND STAGE:  #1 Recurrent non-small cell lung cancer consistent with adenocarcinoma. This was initially diagnosed as stage IB (T2a N0 M0) well-differentiated adenocarcinoma of bronchioalveolar type in December 2010.  #2 Acute pulmonary embolism in the right lower lobe lobar, segmental and subsegmental sized pulmonary arteries, diagnosed on 11/04/2011.   PRIOR THERAPY:  1.Status post right upper lobectomy on September 28, 2009 under the care of Dr. Edwyna Shell. The patient refused adjuvant chemotherapy at that time. She had disease recurrence in June of 2012.  2.Status post 4 cycles of systemic chemotherapy with carboplatin for an AUC of 6 and paclitaxel at 200 mg per meter squared and Avastin at 15 mg/kg according to the ECOG protocol #5508.  3. Chemotherapy with Avastin 15 mg/kg according to the ECOG protocol #5508 status post 32 cycles. Last cycle was given on 06/30/2013 discontinued secondary to disease progression and persistent proteinuria.  CURRENT THERAPY: Tarceva 150 mg by mouth daily, started 08/09/2013  CHEMOTHERAPY INTENT: Palliative.  CURRENT # OF CHEMOTHERAPY CYCLES: 1  CURRENT ANTIEMETICS: Compazine  CURRENT SMOKING STATUS: Non-smoker  ORAL CHEMOTHERAPY AND CONSENT: Tarceva and consent was signed 07/21/2013.  CURRENT BISPHOSPHONATES USE: None  PAIN MANAGEMENT: No pain  NARCOTICS INDUCED CONSTIPATION: No constipation.  LIVING WILL AND CODE STATUS: Full code   INTERVAL HISTORY: [name] 72 y.o. female returns to the clinic today for followup visit. The patient has no complaints today except for the skin rash mainly on the face and nose area. She had 1 or 2 episodes of diarrhea resolved with Imodium. She denied having any significant nausea or vomiting. She denied having any chest pain but continues  to have shortness of breath with exertion with no cough or hemoptysis. The patient denied having any fever or chills. She was started on treatment with Tarceva 2 weeks ago and tolerating it fairly well except for the grade 2 skin rash and few episodes of diarrhea.  MEDICAL HISTORY: Past Medical History  Diagnosis Date  . Lung cancer 03/08/2011    recurrent  . Hypertension   . Hypothyroidism   . Hypercholesterolemia   . Glaucoma   . Hip pain 09/02/2011  . Foot pain 09/02/2011    ALLERGIES:  is allergic to amitriptyline; codeine; and sulfa antibiotics.  MEDICATIONS:  Current Outpatient Prescriptions  Medication Sig Dispense Refill  . amLODipine (NORVASC) 5 MG tablet Take 10 mg by mouth daily. Per [name], NP      . citalopram (CELEXA) 40 MG tablet Take 40 mg by mouth daily.        . clonazePAM (KLONOPIN) 0.5 MG tablet Take 0.5 mg by mouth 2 (two) times daily as needed. Per [name], NP      . cyanocobalamin (,VITAMIN B-12,) 1000 MCG/ML injection Inject 1,000 mcg into the muscle every 30 (thirty) days.      . dorzolamide-timolol (COSOPT) 22.3-6.8 MG/ML ophthalmic solution 2 drops 2 (two) times daily. Per [name], NP       . erlotinib (TARCEVA) 150 MG tablet Take 1 tablet (150 mg total) by mouth daily. Take on an empty stomach 1 hour before meals or 2 hours after.  30 tablet  2  . fish oil-omega-3 fatty acids 1000 MG capsule Take 2 g by mouth daily.      Marland Kitchen levothyroxine (SYNTHROID, LEVOTHROID) 75 MCG tablet Take  75 mcg by mouth daily. Per [name], NP      . lidocaine-prilocaine (EMLA) cream Apply 1 application topically as needed.        Marland Kitchen lisinopril (PRINIVIL,ZESTRIL) 40 MG tablet Take 40 mg by mouth daily. Per [name], NP       . potassium chloride (K-DUR) 10 MEQ tablet Take 2 tablets (20 mEq total) by mouth 2 (two) times daily.  10 tablet  0  . prochlorperazine (COMPAZINE) 10 MG tablet Take 10 mg by mouth every 6 (six) hours as needed.        . Red Yeast  Rice Extract (RED YEAST RICE PO) Take 1 capsule by mouth daily.      Marland Kitchen TARCEVA 150 MG tablet Take 150 mg by mouth daily.       No current facility-administered medications for this visit.    SURGICAL HISTORY:  Past Surgical History  Procedure Laterality Date  . Video assisted thoracoscopy  09/28/2009  . Thoracotomy  09/28/2009    mini  . Lung lobectomy  09/28/2009    right upper    REVIEW OF SYSTEMS:  Constitutional: positive for fatigue Eyes: negative Ears, nose, mouth, throat, and face: negative Respiratory: negative Cardiovascular: negative Gastrointestinal: negative Genitourinary:negative Integument/breast: Acne-like skin rash on the face especially the nose area. Hematologic/lymphatic: negative Musculoskeletal:negative Neurological: positive for paresthesia Behavioral/Psych: negative Endocrine: negative Allergic/Immunologic: negative   PHYSICAL EXAMINATION: General appearance: alert, cooperative, fatigued and no distress Head: Normocephalic, without obvious abnormality, atraumatic Neck: no adenopathy, no JVD, supple, symmetrical, trachea midline and thyroid not enlarged, symmetric, no tenderness/mass/nodules Lymph nodes: Cervical, supraclavicular, and axillary nodes normal. Resp: clear to auscultation bilaterally Back: symmetric, no curvature. ROM normal. No CVA tenderness. Cardio: regular rate and rhythm, S1, S2 normal, no murmur, click, rub or gallop GI: soft, non-tender; bowel sounds normal; no masses,  no organomegaly Extremities: extremities normal, atraumatic, no cyanosis or edema Neurologic: Alert and oriented X 3, normal strength and tone. Normal symmetric reflexes. Normal coordination and gait  ECOG PERFORMANCE STATUS: 1 - Symptomatic but completely ambulatory  Blood pressure 178/73, pulse 76, temperature 97.6 F (36.4 C), temperature source Oral, resp. rate 24, height 5\' 4"  (1.626 m), weight 130 lb 8 oz (59.194 kg).  LABORATORY DATA: Lab Results    Component Value Date   WBC 9.5 08/25/2013   HGB 13.6 08/25/2013   HCT 41.8 08/25/2013   MCV 89.2 08/25/2013   PLT 181 08/25/2013      Chemistry      Component Value Date/Time   NA 143 08/25/2013 0933   NA 136 05/20/2012 1416   NA 142 03/08/2011 0754   K 3.5 08/25/2013 0933   K 4.4 05/20/2012 1416   K 4.2 03/08/2011 0754   CL 107 03/30/2013 1036   CL 99 05/20/2012 1416   CL 102 03/08/2011 0754   CO2 27 08/25/2013 0933   CO2 26 05/20/2012 1416   CO2 27 03/08/2011 0754   BUN 11.6 08/25/2013 0933   BUN 6 05/20/2012 1416   BUN 9 03/08/2011 0754   CREATININE 1.1 08/25/2013 0933   CREATININE 1.11* 05/20/2012 1416   CREATININE 1.0 03/08/2011 0754      Component Value Date/Time   CALCIUM 10.1 08/25/2013 0933   CALCIUM 10.3 05/20/2012 1416   CALCIUM 9.5 03/08/2011 0754   ALKPHOS 76 08/25/2013 0933   ALKPHOS 71 05/20/2012 1416   ALKPHOS 59 03/08/2011 0754   AST 13 08/25/2013 0933   AST 9 05/20/2012 1416   AST  19 03/08/2011 0754   ALT 8 08/25/2013 0933   ALT <5 05/20/2012 1416   ALT 18 03/08/2011 0754   BILITOT 1.02 08/25/2013 0933   BILITOT 0.7 05/20/2012 1416   BILITOT 0.50 03/08/2011 0754       ASSESSMENT AND PLAN: This is a very pleasant 72 years old African American female with recurrent non-small cell lung cancer most recently treated with maintenance Avastin status post 32 cycles. Unfortunately her recent scan results showed evidence for disease progression per the RECIST criteria for the clinical trial. She was started on treatment with oral Tarceva 150 mg by mouth daily 2 weeks ago and tolerating it fairly well except for grade 2 skin rash mainly on the face and occasional episodes of diarrhea. I recommended for the patient to continue treatment with Imodium for the diarrhea on as needed basis. For the skin rash, I will give the patient prescription for clindamycin 1% lotion to be applied to the skin rash area twice a day. For hypertension, the patient will continue on her current medication and  she was advised to consult with her primary care physician for adjustment of her doses. She would come back for followup visit in 3 weeks for reevaluation and repeat blood work. The patient was advised to call immediately if she has any concerning symptoms in the interval. The patient voices understanding of current disease status and treatment options and is in agreement with the current care plan.  All questions were answered. The patient knows to call the clinic with any problems, questions or concerns. We can certainly see the patient much sooner if necessary.

## 2013-08-25 NOTE — Progress Notes (Signed)
08/25/2013 Patient in to clinic today for first follow-up appointment after treatment discontinuation. Patient reports having toenails removed by podiatrist on 07/02/13 and she is due for her last and final post-op check on 09/03/2013. Patient has since started therapy with Tarceva, and reports side effects of facial rash, insomnia and one episode of diarrhea that resolved with use of Imodium. All of these events occurred after beginning Tarceva. She reports that she had no additional side effects related to study treatment, prior to beginning Tarvceva on 08/12/2013. Patient is aware that we are continuing to monitor her status during this follow-up phase. Cindy S. Clelia Croft BSN, RN, CCRP 08/25/2013 10:55 AM

## 2013-08-25 NOTE — Patient Instructions (Signed)
Followup visit in 3 weeks with repeat CBC and comprehensive metabolic panel

## 2013-08-26 ENCOUNTER — Telehealth: Payer: Self-pay | Admitting: Internal Medicine

## 2013-08-26 ENCOUNTER — Other Ambulatory Visit: Payer: Self-pay | Admitting: *Deleted

## 2013-08-26 MED ORDER — CLINDAMYCIN PHOSPHATE 1 % EX LOTN: TOPICAL_LOTION | Freq: Two times a day (BID) | CUTANEOUS | Status: DC

## 2013-09-15 ENCOUNTER — Ambulatory Visit: Payer: Medicare Other

## 2013-09-15 ENCOUNTER — Ambulatory Visit (HOSPITAL_BASED_OUTPATIENT_CLINIC_OR_DEPARTMENT_OTHER): Payer: Medicare Other | Admitting: Physician Assistant

## 2013-09-15 ENCOUNTER — Other Ambulatory Visit (HOSPITAL_BASED_OUTPATIENT_CLINIC_OR_DEPARTMENT_OTHER): Payer: Medicare Other | Admitting: Lab

## 2013-09-15 ENCOUNTER — Telehealth: Payer: Self-pay | Admitting: Internal Medicine

## 2013-09-15 DIAGNOSIS — R21 Rash and other nonspecific skin eruption: Secondary | ICD-10-CM

## 2013-09-15 DIAGNOSIS — C3410 Malignant neoplasm of upper lobe, unspecified bronchus or lung: Secondary | ICD-10-CM

## 2013-09-15 DIAGNOSIS — R0989 Other specified symptoms and signs involving the circulatory and respiratory systems: Secondary | ICD-10-CM

## 2013-09-15 DIAGNOSIS — R0609 Other forms of dyspnea: Secondary | ICD-10-CM

## 2013-09-15 DIAGNOSIS — Z95828 Presence of other vascular implants and grafts: Secondary | ICD-10-CM

## 2013-09-15 DIAGNOSIS — R197 Diarrhea, unspecified: Secondary | ICD-10-CM

## 2013-09-15 LAB — CBC WITH DIFFERENTIAL/PLATELET
BASO%: 1 % (ref 0.0–2.0)
Basophils Absolute: 0.1 10*3/uL (ref 0.0–0.1)
EOS%: 3.4 % (ref 0.0–7.0)
HCT: 38.4 % (ref 34.8–46.6)
HGB: 12.5 g/dL (ref 11.6–15.9)
LYMPH%: 13.9 % — ABNORMAL LOW (ref 14.0–49.7)
MCH: 29.4 pg (ref 25.1–34.0)
MONO#: 0.5 10*3/uL (ref 0.1–0.9)
MONO%: 6.9 % (ref 0.0–14.0)
NEUT%: 74.8 % (ref 38.4–76.8)
Platelets: 165 10*3/uL (ref 145–400)
lymph#: 1.1 10*3/uL (ref 0.9–3.3)

## 2013-09-15 LAB — PROTEIN / CREATININE RATIO, URINE: Protein Creatinine Ratio: 4.54 — ABNORMAL HIGH (ref <0.15)

## 2013-09-15 LAB — COMPREHENSIVE METABOLIC PANEL (CC13)
Albumin: 3.2 g/dL — ABNORMAL LOW (ref 3.5–5.0)
Alkaline Phosphatase: 83 U/L (ref 40–150)
Anion Gap: 9 mEq/L (ref 3–11)
BUN: 9.9 mg/dL (ref 7.0–26.0)
CO2: 25 mEq/L (ref 22–29)
Calcium: 9.5 mg/dL (ref 8.4–10.4)
Glucose: 104 mg/dl (ref 70–140)
Potassium: 3.7 mEq/L (ref 3.5–5.1)
Sodium: 142 mEq/L (ref 136–145)
Total Protein: 6.3 g/dL — ABNORMAL LOW (ref 6.4–8.3)

## 2013-09-15 MED ORDER — HEPARIN SOD (PORK) LOCK FLUSH 100 UNIT/ML IV SOLN
500.0000 [IU] | Freq: Once | INTRAVENOUS | Status: AC
  Administered 2013-09-15: 500 [IU] via INTRAVENOUS
  Filled 2013-09-15: qty 5

## 2013-09-15 MED ORDER — SODIUM CHLORIDE 0.9 % IJ SOLN
10.0000 mL | INTRAMUSCULAR | Status: DC | PRN
  Administered 2013-09-15: 10 mL via INTRAVENOUS
  Filled 2013-09-15: qty 10

## 2013-09-15 NOTE — Telephone Encounter (Signed)
Gave pt appt for lab,inj and MD for December and January2015

## 2013-09-15 NOTE — Patient Instructions (Signed)
Implanted Port Instructions  An implanted port is a central line that has a round shape and is placed under the skin. It is used for long-term IV (intravenous) access for:  · Medicine.  · Fluids.  · Liquid nutrition, such as TPN (total parenteral nutrition).  · Blood samples.  Ports can be placed:  · In the chest area just below the collarbone (this is the most common place.)  · In the arms.  · In the belly (abdomen) area.  · In the legs.  PARTS OF THE PORT  A port has 2 main parts:  · The reservoir. The reservoir is round, disc-shaped, and will be a small, raised area under your skin.  · The reservoir is the part where a needle is inserted (accessed) to either give medicines or to draw blood.  · The catheter. The catheter is a long, slender tube that extends from the reservoir. The catheter is placed into a large vein.  · Medicine that is inserted into the reservoir goes into the catheter and then into the vein.  INSERTION OF THE PORT  · The port is surgically placed in either an operating room or in a procedural area (interventional radiology).  · Medicine may be given to help you relax during the procedure.  · The skin where the port will be inserted is numbed (local anesthetic).  · 1 or 2 small cuts (incisions) will be made in the skin to insert the port.  · The port can be used after it has been inserted.  INCISION SITE CARE  · The incision site may have small adhesive strips on it. This helps keep the incision site closed. Sometimes, no adhesive strips are placed. Instead of adhesive strips, a special kind of surgical glue is used to keep the incision closed.  · If adhesive strips were placed on the incision sites, do not take them off. They will fall off on their own.  · The incision site may be sore for 1 to 2 days. Pain medicine can help.  · Do not get the incision site wet. Bathe or shower as directed by your caregiver.  · The incision site should heal in 5 to 7 days. A small scar may form after the  incision has healed.  ACCESSING THE PORT  Special steps must be taken to access the port:  · Before the port is accessed, a numbing cream can be placed on the skin. This helps numb the skin over the port site.  · A sterile technique is used to access the port.  · The port is accessed with a needle. Only "non-coring" port needles should be used to access the port. Once the port is accessed, a blood return should be checked. This helps ensure the port is in the vein and is not clogged (clotted).  · If your caregiver believes your port should remain accessed, a clear (transparent) bandage will be placed over the needle site. The bandage and needle will need to be changed every week or as directed by your caregiver.  · Keep the bandage covering the needle clean and dry. Do not get it wet. Follow your caregiver's instructions on how to take a shower or bath when the port is accessed.  · If your port does not need to stay accessed, no bandage is needed over the port.  FLUSHING THE PORT  Flushing the port keeps it from getting clogged. How often the port is flushed depends on:  · If a   constant infusion is running. If a constant infusion is running, the port may not need to be flushed.  · If intermittent medicines are given.  · If the port is not being used.  For intermittent medicines:  · The port will need to be flushed:  · After medicines have been given.  · After blood has been drawn.  · As part of routine maintenance.  · A port is normally flushed with:  · Normal saline.  · Heparin.  · Follow your caregiver's advice on how often, how much, and the type of flush to use on your port.  IMPORTANT PORT INFORMATION  · Tell your caregiver if you are allergic to heparin.  · After your port is placed, you will get a manufacturer's information card. The card has information about your port. Keep this card with you at all times.  · There are many types of ports available. Know what kind of port you have.  · In case of an  emergency, it may be helpful to wear a medical alert bracelet. This can help alert health care workers that you have a port.  · The port can stay in for as long as your caregiver believes it is necessary.  · When it is time for the port to come out, surgery will be done to remove it. The surgery will be similar to how the port was put in.  · If you are in the hospital or clinic:  · Your port will be taken care of and flushed by a nurse.  · If you are at home:  · A home health care nurse may give medicines and take care of the port.  · You or a family member can get special training and directions for giving medicine and taking care of the port at home.  SEEK IMMEDIATE MEDICAL CARE IF:   · Your port does not flush or you are unable to get a blood return.  · New drainage or pus is coming from the incision.  · A bad smell is coming from the incision site.  · You develop swelling or increased redness at the incision site.  · You develop increased swelling or pain at the port site.  · You develop swelling or pain in the surrounding skin near the port.  · You have an oral temperature above 102° F (38.9° C), not controlled by medicine.  MAKE SURE YOU:   · Understand these instructions.  · Will watch your condition.  · Will get help right away if you are not doing well or get worse.  Document Released: 09/30/2005 Document Revised: 12/23/2011 Document Reviewed: 12/22/2008  [employer]® Patient Information ©2014 [employer].

## 2013-09-16 ENCOUNTER — Encounter: Payer: Self-pay | Admitting: Physician Assistant

## 2013-09-16 NOTE — Patient Instructions (Signed)
Continue taking Tarceva 150 mg by mouth daily Followup in approximately 4 weeks for another symptom management visit as well as your next B12 injection

## 2013-09-16 NOTE — Progress Notes (Addendum)
[name] Telephone:([phone]) [phone]   Fax:([phone]) [phone]  SHARED VISIT PROGRESS NOTE  [name],[name], [name] [phone] [place] [address]  DIAGNOSIS AND STAGE:  #1 Recurrent non-small cell lung cancer consistent with adenocarcinoma. This was initially diagnosed as stage IB (T2a N0 M0) well-differentiated adenocarcinoma of bronchioalveolar type in December 2010.  #2 Acute pulmonary embolism in the right lower lobe lobar, segmental and subsegmental sized pulmonary arteries, diagnosed on 11/04/2011.   PRIOR THERAPY:  1.Status post right upper lobectomy on September 28, 2009 under the care of Dr. Edwyna Shell. The patient refused adjuvant chemotherapy at that time. She had disease recurrence in June of 2012.  2.Status post 4 cycles of systemic chemotherapy with carboplatin for an AUC of 6 and paclitaxel at 200 mg per meter squared and Avastin at 15 mg/kg according to the ECOG protocol #5508.  3. Chemotherapy with Avastin 15 mg/kg according to the ECOG protocol #5508 status post 32 cycles. Last cycle was given on 06/30/2013 discontinued secondary to disease progression and persistent proteinuria.  CURRENT THERAPY: Tarceva 150 mg by mouth daily, started 08/09/2013. Post approximately 5 weeks of therapy  CHEMOTHERAPY INTENT: Palliative.  CURRENT # OF CHEMOTHERAPY CYCLES: 1  CURRENT ANTIEMETICS: Compazine  CURRENT SMOKING STATUS: Non-smoker  ORAL CHEMOTHERAPY AND CONSENT: Tarceva and consent was signed 07/21/2013.  CURRENT BISPHOSPHONATES USE: None  PAIN MANAGEMENT: No pain  NARCOTICS INDUCED CONSTIPATION: No constipation.  LIVING WILL AND CODE STATUS: Full code   INTERVAL HISTORY: [name] 72 y.o. female returns to the clinic today for followup visit. She reports her skin rash is better and her diarrhea is overall under better control. She reports feeling significantly better after receiving her monthly B12 injection. She is due next for her B12 injection on  09/20/2013. Her Port-A-Cath was flushed today. She voiced no other specific complaints today. Her appetite is good and she is able to do the daily activities that she desires. She had 1 or 2 episodes of diarrhea that resolved with Imodium. She denied having any significant nausea or vomiting. She denied having any chest pain but continues to have shortness of breath with exertion with no cough or hemoptysis. The patient denied having any fever or chills.   MEDICAL HISTORY: Past Medical History  Diagnosis Date  . Lung cancer 03/08/2011    recurrent  . Hypertension   . Hypothyroidism   . Hypercholesterolemia   . Glaucoma   . Hip pain 09/02/2011  . Foot pain 09/02/2011    ALLERGIES:  is allergic to amitriptyline; codeine; and sulfa antibiotics.  MEDICATIONS:  Current Outpatient Prescriptions  Medication Sig Dispense Refill  . amLODipine (NORVASC) 5 MG tablet Take 10 mg by mouth daily. Per [name], NP      . clindamycin (CLEOCIN T) 1 % lotion Apply topically 2 (two) times daily. Apply twice daily as needed  120 mL  0  . clonazePAM (KLONOPIN) 0.5 MG tablet Take 0.5 mg by mouth 2 (two) times daily as needed. Per [name], NP      . cyanocobalamin (,VITAMIN B-12,) 1000 MCG/ML injection Inject 1,000 mcg into the muscle every 30 (thirty) days.      . dorzolamide-timolol (COSOPT) 22.3-6.8 MG/ML ophthalmic solution 2 drops 2 (two) times daily. Per [name], NP       . erlotinib (TARCEVA) 150 MG tablet Take 1 tablet (150 mg total) by mouth daily. Take on an empty stomach 1 hour before meals or 2 hours after.  30 tablet  2  . fish oil-omega-3 fatty acids 1000 MG capsule Take 2 g by mouth daily.      Marland Kitchen levothyroxine (SYNTHROID, LEVOTHROID) 75 MCG tablet Take 75 mcg by mouth daily. Per [name], NP      . lidocaine-prilocaine (EMLA) cream Apply 1 application topically as needed.        Marland Kitchen lisinopril (PRINIVIL,ZESTRIL) 40 MG tablet Take 40 mg by mouth daily. Per [name], NP        . prochlorperazine (COMPAZINE) 10 MG tablet Take 10 mg by mouth every 6 (six) hours as needed.        . Red Yeast Rice Extract (RED YEAST RICE PO) Take 1 capsule by mouth daily.      Marland Kitchen TARCEVA 150 MG tablet Take 150 mg by mouth daily.      . citalopram (CELEXA) 40 MG tablet Take 40 mg by mouth daily.        . potassium chloride (K-DUR) 10 MEQ tablet Take 2 tablets (20 mEq total) by mouth 2 (two) times daily.  10 tablet  0   No current facility-administered medications for this visit.    SURGICAL HISTORY:  Past Surgical History  Procedure Laterality Date  . Video assisted thoracoscopy  09/28/2009  . Thoracotomy  09/28/2009    mini  . Lung lobectomy  09/28/2009    right upper    REVIEW OF SYSTEMS:  Constitutional: positive for fatigue Eyes: negative Ears, nose, mouth, throat, and face: negative Respiratory: negative Cardiovascular: negative Gastrointestinal: negative Genitourinary:negative Integument/breast: Acne-like skin rash on the face especially the nose area. Hematologic/lymphatic: negative Musculoskeletal:negative Neurological: positive for paresthesia Behavioral/Psych: negative Endocrine: negative Allergic/Immunologic: negative   PHYSICAL EXAMINATION: General appearance: alert, cooperative, fatigued and no distress Head: Normocephalic, without obvious abnormality, atraumatic Neck: no adenopathy, no JVD, supple, symmetrical, trachea midline and thyroid not enlarged, symmetric, no tenderness/mass/nodules Lymph nodes: Cervical, supraclavicular, and axillary nodes normal. Resp: clear to auscultation bilaterally Back: symmetric, no curvature. ROM normal. No CVA tenderness. Cardio: regular rate and rhythm, S1, S2 normal, no murmur, click, rub or gallop GI: soft, non-tender; bowel sounds normal; no masses,  no organomegaly Extremities: extremities normal, atraumatic, no cyanosis or edema Neurologic: Alert and oriented X 3, normal strength and tone. Normal symmetric  reflexes. Normal coordination and gait Skin: Faint erythematous macular lesions with a few acneiform her options on the nose and cheeks, no evidence of superinfection  ECOG PERFORMANCE STATUS: 1 - Symptomatic but completely ambulatory  Blood pressure 167/83, pulse 73, temperature 97.1 F (36.2 C), temperature source Oral, resp. rate 18, height 5\' 4"  (1.626 m), weight 131 lb 4.8 oz (59.557 kg), SpO2 96.00%.  LABORATORY DATA: Lab Results  Component Value Date   WBC 7.7 09/15/2013   HGB 12.5 09/15/2013   HCT 38.4 09/15/2013   MCV 90.5 09/15/2013   PLT 165 09/15/2013      Chemistry      Component Value Date/Time   NA 142 09/15/2013 1109   NA 136 05/20/2012 1416   NA 142 03/08/2011 0754   K 3.7 09/15/2013 1109   K 4.4 05/20/2012 1416   K 4.2 03/08/2011 0754   CL 107 03/30/2013 1036   CL 99 05/20/2012 1416   CL 102 03/08/2011 0754   CO2 25 09/15/2013 1109   CO2 26 05/20/2012 1416   CO2 27 03/08/2011 0754   BUN 9.9 09/15/2013 1109   BUN 6 05/20/2012 1416   BUN 9 03/08/2011 0754   CREATININE 1.1 09/15/2013  1109   CREATININE 1.11* 05/20/2012 1416   CREATININE 1.0 03/08/2011 0754      Component Value Date/Time   CALCIUM 9.5 09/15/2013 1109   CALCIUM 10.3 05/20/2012 1416   CALCIUM 9.5 03/08/2011 0754   ALKPHOS 83 09/15/2013 1109   ALKPHOS 71 05/20/2012 1416   ALKPHOS 59 03/08/2011 0754   AST 17 09/15/2013 1109   AST 9 05/20/2012 1416   AST 19 03/08/2011 0754   ALT 15 09/15/2013 1109   ALT <5 05/20/2012 1416   ALT 18 03/08/2011 0754   BILITOT 0.79 09/15/2013 1109   BILITOT 0.7 05/20/2012 1416   BILITOT 0.50 03/08/2011 0754       ASSESSMENT AND PLAN: This is a very pleasant 72 years old African American female with recurrent non-small cell lung cancer most recently treated with maintenance Avastin status post 32 cycles. Unfortunately her recent scan results showed evidence for disease progression per the RECIST criteria for the clinical trial. She was started on treatment with oral Tarceva 150 mg by mouth daily  approximately 5 weeks ago and tolerating it fairly well except for grade 1- 2 skin rash mainly on the face and occasional episodes of diarrhea. Patient was discussed with him also seen by Dr. Arbutus Ped. She will continue with clindamycin1% lotion to be applied to the skin rash area twice a day. She'll also continue Imodium as needed for the diarrhea. She'll followup with Dr. Arbutus Ped in approximately 4 weeks to coincide with her next B12 injection with repeat CBC differential and C. met. We'll plan to do restaging CT scans after she has completed 3 months of therapy with Tarceva at 150 mg by mouth daily. She will continue on Tarceva at 150 mg by mouth daily.   Laural Benes, Ketty Bitton E, PA-C   The patient was advised to call immediately if she has any concerning symptoms in the interval. The patient voices understanding of current disease status and treatment options and is in agreement with the current care plan.  All questions were answered. The patient knows to call the clinic with any problems, questions or concerns. We can certainly see the patient much sooner if necessary.   ADDENDUM:  Hematology/Oncology Attending:  I had a face to face encounter with the patient. I recommended her current plan. This is a very pleasant 72 years old [place] American female with recurrent non-small cell lung cancer, adenocarcinoma currently undergoing treatment with oral Tarceva 150 mg by mouth daily and tolerating it fairly well. She has grade 1 skin rash with significant improvement compared to few weeks ago. The patient also has occasional diarrhea. I recommended for her to continue her current treatment as scheduled. She would come back for follow up visit in one month's for reevaluation and management any adverse effect of her treatment. She was advised to call immediately if she has any concerning symptoms in the interval. Lajuana Matte., MD 09/18/2013

## 2013-09-20 ENCOUNTER — Ambulatory Visit: Payer: Medicare Other

## 2013-09-20 ENCOUNTER — Telehealth: Payer: Self-pay | Admitting: Internal Medicine

## 2013-09-20 NOTE — Telephone Encounter (Signed)
s.w. pt and she is having car problems r/s inj.

## 2013-09-21 ENCOUNTER — Ambulatory Visit (HOSPITAL_BASED_OUTPATIENT_CLINIC_OR_DEPARTMENT_OTHER): Payer: Medicare Other

## 2013-09-21 VITALS — BP 149/64 | HR 68 | Temp 98.2°F

## 2013-09-21 DIAGNOSIS — C3410 Malignant neoplasm of upper lobe, unspecified bronchus or lung: Secondary | ICD-10-CM

## 2013-09-21 DIAGNOSIS — E538 Deficiency of other specified B group vitamins: Secondary | ICD-10-CM

## 2013-09-21 MED ORDER — CYANOCOBALAMIN 1000 MCG/ML IJ SOLN
1000.0000 ug | Freq: Once | INTRAMUSCULAR | Status: AC
  Administered 2013-09-21: 1000 ug via INTRAMUSCULAR

## 2013-10-18 ENCOUNTER — Other Ambulatory Visit: Payer: Medicare Other

## 2013-10-18 ENCOUNTER — Telehealth: Payer: Self-pay | Admitting: Internal Medicine

## 2013-10-18 ENCOUNTER — Ambulatory Visit: Payer: Medicare Other

## 2013-10-18 ENCOUNTER — Ambulatory Visit: Payer: Medicare Other | Admitting: Internal Medicine

## 2013-10-18 ENCOUNTER — Other Ambulatory Visit: Payer: Self-pay | Admitting: Internal Medicine

## 2013-10-18 NOTE — Telephone Encounter (Signed)
pt called to r/s appt due to bowel issues...done

## 2013-10-19 ENCOUNTER — Other Ambulatory Visit: Payer: Self-pay | Admitting: Internal Medicine

## 2013-10-19 DIAGNOSIS — C3490 Malignant neoplasm of unspecified part of unspecified bronchus or lung: Secondary | ICD-10-CM

## 2013-10-20 ENCOUNTER — Other Ambulatory Visit (HOSPITAL_BASED_OUTPATIENT_CLINIC_OR_DEPARTMENT_OTHER): Payer: Medicare Other

## 2013-10-20 ENCOUNTER — Ambulatory Visit (HOSPITAL_BASED_OUTPATIENT_CLINIC_OR_DEPARTMENT_OTHER): Payer: Medicare Other | Admitting: Internal Medicine

## 2013-10-20 ENCOUNTER — Telehealth: Payer: Self-pay | Admitting: Internal Medicine

## 2013-10-20 ENCOUNTER — Encounter (INDEPENDENT_AMBULATORY_CARE_PROVIDER_SITE_OTHER): Payer: Self-pay

## 2013-10-20 ENCOUNTER — Ambulatory Visit (HOSPITAL_BASED_OUTPATIENT_CLINIC_OR_DEPARTMENT_OTHER): Payer: Medicare Other

## 2013-10-20 ENCOUNTER — Encounter: Payer: Self-pay | Admitting: Internal Medicine

## 2013-10-20 DIAGNOSIS — E538 Deficiency of other specified B group vitamins: Secondary | ICD-10-CM

## 2013-10-20 DIAGNOSIS — C3410 Malignant neoplasm of upper lobe, unspecified bronchus or lung: Secondary | ICD-10-CM

## 2013-10-20 DIAGNOSIS — R197 Diarrhea, unspecified: Secondary | ICD-10-CM

## 2013-10-20 DIAGNOSIS — R21 Rash and other nonspecific skin eruption: Secondary | ICD-10-CM

## 2013-10-20 DIAGNOSIS — I10 Essential (primary) hypertension: Secondary | ICD-10-CM

## 2013-10-20 LAB — CBC WITH DIFFERENTIAL/PLATELET
BASO%: 0.8 % (ref 0.0–2.0)
BASOS ABS: 0.1 10*3/uL (ref 0.0–0.1)
EOS%: 2.2 % (ref 0.0–7.0)
Eosinophils Absolute: 0.2 10*3/uL (ref 0.0–0.5)
HEMATOCRIT: 39.4 % (ref 34.8–46.6)
HEMOGLOBIN: 13 g/dL (ref 11.6–15.9)
LYMPH#: 1.6 10*3/uL (ref 0.9–3.3)
LYMPH%: 18.5 % (ref 14.0–49.7)
MCH: 29.6 pg (ref 25.1–34.0)
MCHC: 33.1 g/dL (ref 31.5–36.0)
MCV: 89.5 fL (ref 79.5–101.0)
MONO#: 0.5 10*3/uL (ref 0.1–0.9)
MONO%: 6.2 % (ref 0.0–14.0)
NEUT#: 6.3 10*3/uL (ref 1.5–6.5)
NEUT%: 72.3 % (ref 38.4–76.8)
Platelets: 161 10*3/uL (ref 145–400)
RBC: 4.4 10*6/uL (ref 3.70–5.45)
RDW: 14.9 % — ABNORMAL HIGH (ref 11.2–14.5)
WBC: 8.8 10*3/uL (ref 3.9–10.3)

## 2013-10-20 LAB — COMPREHENSIVE METABOLIC PANEL (CC13)
ALK PHOS: 83 U/L (ref 40–150)
ALT: 12 U/L (ref 0–55)
AST: 13 U/L (ref 5–34)
Albumin: 3.4 g/dL — ABNORMAL LOW (ref 3.5–5.0)
Anion Gap: 8 mEq/L (ref 3–11)
BUN: 10.1 mg/dL (ref 7.0–26.0)
CHLORIDE: 106 meq/L (ref 98–109)
CO2: 28 meq/L (ref 22–29)
Calcium: 9.9 mg/dL (ref 8.4–10.4)
Creatinine: 1.1 mg/dL (ref 0.6–1.1)
Glucose: 85 mg/dl (ref 70–140)
Potassium: 3.5 mEq/L (ref 3.5–5.1)
Sodium: 142 mEq/L (ref 136–145)
TOTAL PROTEIN: 6.7 g/dL (ref 6.4–8.3)
Total Bilirubin: 0.91 mg/dL (ref 0.20–1.20)

## 2013-10-20 LAB — PROTEIN / CREATININE RATIO, URINE
Creatinine, Urine: 40.2 mg/dL
PROTEIN CREATININE RATIO: 4.25 — AB (ref <0.15)
TOTAL PROTEIN, URINE: 171 mg/dL

## 2013-10-20 LAB — UA PROTEIN, DIPSTICK - CHCC: Protein, ur: 100 mg/dL

## 2013-10-20 MED ORDER — CYANOCOBALAMIN 1000 MCG/ML IJ SOLN
1000.0000 ug | Freq: Once | INTRAMUSCULAR | Status: AC
  Administered 2013-10-20: 1000 ug via INTRAMUSCULAR

## 2013-10-20 NOTE — Patient Instructions (Signed)
CURRENT THERAPY: Tarceva 150 mg by mouth daily, started 08/09/2013  CHEMOTHERAPY INTENT: Palliative.  CURRENT # OF CHEMOTHERAPY CYCLES: 3  CURRENT ANTIEMETICS: Compazine  CURRENT SMOKING STATUS: Non-smoker  ORAL CHEMOTHERAPY AND CONSENT: Tarceva and consent was signed 07/21/2013.  CURRENT BISPHOSPHONATES USE: None  PAIN MANAGEMENT: No pain  NARCOTICS INDUCED CONSTIPATION: No constipation.  LIVING WILL AND CODE STATUS: Full code

## 2013-10-20 NOTE — Telephone Encounter (Signed)
GV AND PRINTED APPT SCHED AND AVS FOR PT FOR jAN AND fEB

## 2013-10-20 NOTE — Progress Notes (Signed)
[name]) [phone]   Fax:(336) [phone]  OFFICE PROGRESS NOTE  [name],Terri, [place] [place] [address]  DIAGNOSIS AND STAGE:  #1 Recurrent non-small cell lung cancer consistent with adenocarcinoma. This was initially diagnosed as stage IB (T2a N0 M0) well-differentiated adenocarcinoma of bronchioalveolar type in December 2010.  #2 Acute pulmonary embolism in the right lower lobe lobar, segmental and subsegmental sized pulmonary arteries, diagnosed on 11/04/2011.   PRIOR THERAPY:  1.Status post right upper lobectomy on September 28, 2009 under the care of Dr. Arlyce Dice. The patient refused adjuvant chemotherapy at that time. She had disease recurrence in June of 2012.  2.Status post 4 cycles of systemic chemotherapy with carboplatin for an AUC of 6 and paclitaxel at 200 mg per meter squared and Avastin at 15 mg/kg according to the ECOG protocol #5508.  3. Chemotherapy with Avastin 15 mg/kg according to the ECOG protocol #5508 status post 32 cycles. Last cycle was given on 06/30/2013 discontinued secondary to disease progression and persistent proteinuria.  CURRENT THERAPY: Tarceva 150 mg by mouth daily, started 08/09/2013  CHEMOTHERAPY INTENT: Palliative.  CURRENT # OF CHEMOTHERAPY CYCLES: 3 CURRENT ANTIEMETICS: Compazine  CURRENT SMOKING STATUS: Non-smoker  ORAL CHEMOTHERAPY AND CONSENT: Tarceva and consent was signed 07/21/2013.  CURRENT BISPHOSPHONATES USE: None  PAIN MANAGEMENT: No pain  NARCOTICS INDUCED CONSTIPATION: No constipation.  LIVING WILL AND CODE STATUS: Full code   INTERVAL HISTORY: [name] 73 y.o. female returns to the clinic today for followup visit. The patient has no complaints today except for mild skin rash mainly on the face and nose area. She had 1 or 2 episodes of diarrhea resolved with Imodium. She denied having any significant nausea or vomiting. She denied having any chest pain but continues to  have shortness of breath with exertion with no cough or hemoptysis. The patient denied having any fever or chills. She was started on treatment with Tarceva 2 months ago and tolerating it fairly well except for mild skin rash and few episodes of diarrhea.  MEDICAL HISTORY: Past Medical History  Diagnosis Date  . Lung cancer 03/08/2011    recurrent  . Hypertension   . Hypothyroidism   . Hypercholesterolemia   . Glaucoma   . Hip pain 09/02/2011  . Foot pain 09/02/2011    ALLERGIES:  is allergic to amitriptyline; codeine; and sulfa antibiotics.  MEDICATIONS:  Current Outpatient Prescriptions  Medication Sig Dispense Refill  . amLODipine (NORVASC) 5 MG tablet Take 10 mg by mouth daily. Per [name], NP      . citalopram (CELEXA) 40 MG tablet Take 40 mg by mouth daily.        . clindamycin (CLEOCIN T) 1 % lotion Apply topically 2 (two) times daily. Apply twice daily as needed  120 mL  0  . clonazePAM (KLONOPIN) 0.5 MG tablet Take 0.5 mg by mouth 2 (two) times daily as needed. Per [name], NP      . cyanocobalamin (,VITAMIN B-12,) 1000 MCG/ML injection Inject 1,000 mcg into the muscle every 30 (thirty) days.      . dorzolamide-timolol (COSOPT) 22.3-6.8 MG/ML ophthalmic solution 2 drops 2 (two) times daily. Per [name], NP       . fish oil-omega-3 fatty acids 1000 MG capsule Take 2 g by mouth daily.      Marland Kitchen levothyroxine (SYNTHROID, LEVOTHROID) 75 MCG tablet Take 88 mcg by mouth daily. Per [name], NP      .  lisinopril (PRINIVIL,ZESTRIL) 40 MG tablet Take 40 mg by mouth daily. Per [name], NP       . potassium chloride (K-DUR) 10 MEQ tablet Take 2 tablets (20 mEq total) by mouth 2 (two) times daily.  10 tablet  0  . Red Yeast Rice Extract (RED YEAST RICE PO) Take 1 capsule by mouth daily.      Marland Kitchen TARCEVA 150 MG tablet TAKE 1 TABLET BY MOUTH    DAILY ON EMPTY STOMACH  30 tablet  1  . lidocaine-prilocaine (EMLA) cream Apply 1 application topically as needed.        .  prochlorperazine (COMPAZINE) 10 MG tablet Take 10 mg by mouth every 6 (six) hours as needed.         No current facility-administered medications for this visit.    SURGICAL HISTORY:  Past Surgical History  Procedure Laterality Date  . Video assisted thoracoscopy  09/28/2009  . Thoracotomy  09/28/2009    mini  . Lung lobectomy  09/28/2009    right upper    REVIEW OF SYSTEMS:  Constitutional: positive for fatigue Eyes: negative Ears, nose, mouth, throat, and face: negative Respiratory: negative Cardiovascular: negative Gastrointestinal: negative Genitourinary:negative Integument/breast: Acne-like skin rash on the face especially the nose area. Hematologic/lymphatic: negative Musculoskeletal:negative Neurological: positive for paresthesia Behavioral/Psych: negative Endocrine: negative Allergic/Immunologic: negative   PHYSICAL EXAMINATION: General appearance: alert, cooperative, fatigued and no distress Head: Normocephalic, without obvious abnormality, atraumatic Neck: no adenopathy, no JVD, supple, symmetrical, trachea midline and thyroid not enlarged, symmetric, no tenderness/mass/nodules Lymph nodes: Cervical, supraclavicular, and axillary nodes normal. Resp: clear to auscultation bilaterally Back: symmetric, no curvature. ROM normal. No CVA tenderness. Cardio: regular rate and rhythm, S1, S2 normal, no murmur, click, rub or gallop GI: soft, non-tender; bowel sounds normal; no masses,  no organomegaly Extremities: extremities normal, atraumatic, no cyanosis or edema Neurologic: Alert and oriented X 3, normal strength and tone. Normal symmetric reflexes. Normal coordination and gait  ECOG PERFORMANCE STATUS: 1 - Symptomatic but completely ambulatory  Blood pressure 168/82, pulse 75, temperature 97.7 F (36.5 C), temperature source Oral, resp. rate 17, height 5\' 4"  (1.626 m), weight 129 lb 6.4 oz (58.695 kg), SpO2 100.00%.  LABORATORY DATA: Lab Results  Component Value  Date   WBC 8.8 10/20/2013   HGB 13.0 10/20/2013   HCT 39.4 10/20/2013   MCV 89.5 10/20/2013   PLT 161 10/20/2013      Chemistry      Component Value Date/Time   NA 142 10/20/2013 1009   NA 136 05/20/2012 1416   NA 142 03/08/2011 0754   K 3.5 10/20/2013 1009   K 4.4 05/20/2012 1416   K 4.2 03/08/2011 0754   CL 107 03/30/2013 1036   CL 99 05/20/2012 1416   CL 102 03/08/2011 0754   CO2 28 10/20/2013 1009   CO2 26 05/20/2012 1416   CO2 27 03/08/2011 0754   BUN 10.1 10/20/2013 1009   BUN 6 05/20/2012 1416   BUN 9 03/08/2011 0754   CREATININE 1.1 10/20/2013 1009   CREATININE 1.11* 05/20/2012 1416   CREATININE 1.0 03/08/2011 0754      Component Value Date/Time   CALCIUM 9.9 10/20/2013 1009   CALCIUM 10.3 05/20/2012 1416   CALCIUM 9.5 03/08/2011 0754   ALKPHOS 83 10/20/2013 1009   ALKPHOS 71 05/20/2012 1416   ALKPHOS 59 03/08/2011 0754   AST 13 10/20/2013 1009   AST 9 05/20/2012 1416   AST 19 03/08/2011 0754   ALT 12  10/20/2013 1009   ALT <5 05/20/2012 1416   ALT 18 03/08/2011 0754   BILITOT 0.91 10/20/2013 1009   BILITOT 0.7 05/20/2012 1416   BILITOT 0.50 03/08/2011 0754       ASSESSMENT AND PLAN: This is a very pleasant 73 years old [place] American female with recurrent non-small cell lung cancer most recently treated with maintenance Avastin status post 32 cycles. She is currently on treatment with single agent Tarceva 150 mg by mouth daily status post 2 months and tolerating her treatment fairly well except for mild skin rash and occasional diarrhea. I recommended for the patient to continue treatment with Imodium for the diarrhea on as needed basis. For the skin rash, she will continue to apply the clindamycin 1% lotion to the skin rash area twice a day. For hypertension, the patient will continue on her current medication and she was advised to consult with her primary care physician for adjustment of her doses. She would come back for followup visit in 4 weeks for reevaluation and CT scan of the chest as well as repeat blood  work. The patient was advised to call immediately if she has any concerning symptoms in the interval. The patient voices understanding of current disease status and treatment options and is in agreement with the current care plan.  All questions were answered. The patient knows to call the clinic with any problems, questions or concerns. We can certainly see the patient much sooner if necessary.

## 2013-10-21 ENCOUNTER — Other Ambulatory Visit: Payer: Self-pay | Admitting: *Deleted

## 2013-10-26 ENCOUNTER — Telehealth: Payer: Self-pay | Admitting: Internal Medicine

## 2013-10-26 NOTE — Telephone Encounter (Signed)
pt called to r/s appt to thurs....done...pt ok and aware

## 2013-10-28 ENCOUNTER — Ambulatory Visit (HOSPITAL_BASED_OUTPATIENT_CLINIC_OR_DEPARTMENT_OTHER): Payer: Medicare Other

## 2013-10-28 ENCOUNTER — Telehealth: Payer: Self-pay | Admitting: Internal Medicine

## 2013-10-28 VITALS — BP 157/81 | HR 71 | Temp 97.1°F

## 2013-10-28 DIAGNOSIS — Z95828 Presence of other vascular implants and grafts: Secondary | ICD-10-CM

## 2013-10-28 DIAGNOSIS — C3410 Malignant neoplasm of upper lobe, unspecified bronchus or lung: Secondary | ICD-10-CM

## 2013-10-28 DIAGNOSIS — Z452 Encounter for adjustment and management of vascular access device: Secondary | ICD-10-CM

## 2013-10-28 MED ORDER — HEPARIN SOD (PORK) LOCK FLUSH 100 UNIT/ML IV SOLN
500.0000 [IU] | Freq: Once | INTRAVENOUS | Status: AC
  Administered 2013-10-28: 500 [IU] via INTRAVENOUS
  Filled 2013-10-28: qty 5

## 2013-10-28 MED ORDER — SODIUM CHLORIDE 0.9 % IJ SOLN
10.0000 mL | INTRAMUSCULAR | Status: DC | PRN
  Administered 2013-10-28: 10 mL via INTRAVENOUS
  Filled 2013-10-28: qty 10

## 2013-10-28 NOTE — Patient Instructions (Signed)

## 2013-10-28 NOTE — Telephone Encounter (Signed)
returned pt call sw.. pt and advised on todays appt...pt ok and aware

## 2013-11-15 ENCOUNTER — Ambulatory Visit (HOSPITAL_COMMUNITY)
Admission: RE | Admit: 2013-11-15 | Discharge: 2013-11-15 | Disposition: A | Discharge status: Home / Self Care | Payer: Medicare Other | Source: Ambulatory Visit | Attending: Internal Medicine | Admitting: Internal Medicine

## 2013-11-15 ENCOUNTER — Encounter (HOSPITAL_COMMUNITY): Payer: Self-pay

## 2013-11-15 ENCOUNTER — Other Ambulatory Visit: Payer: Medicare Other

## 2013-11-15 ENCOUNTER — Other Ambulatory Visit (HOSPITAL_BASED_OUTPATIENT_CLINIC_OR_DEPARTMENT_OTHER): Payer: Medicare Other

## 2013-11-15 DIAGNOSIS — C3490 Malignant neoplasm of unspecified part of unspecified bronchus or lung: Secondary | ICD-10-CM | POA: Insufficient documentation

## 2013-11-15 DIAGNOSIS — C3410 Malignant neoplasm of upper lobe, unspecified bronchus or lung: Secondary | ICD-10-CM

## 2013-11-15 DIAGNOSIS — I2584 Coronary atherosclerosis due to calcified coronary lesion: Secondary | ICD-10-CM | POA: Insufficient documentation

## 2013-11-15 LAB — CBC WITH DIFFERENTIAL/PLATELET
BASO%: 0.9 % (ref 0.0–2.0)
Basophils Absolute: 0.1 10*3/uL (ref 0.0–0.1)
EOS ABS: 0.2 10*3/uL (ref 0.0–0.5)
EOS%: 2 % (ref 0.0–7.0)
HCT: 37.3 % (ref 34.8–46.6)
HGB: 12.3 g/dL (ref 11.6–15.9)
LYMPH%: 18.7 % (ref 14.0–49.7)
MCH: 29.3 pg (ref 25.1–34.0)
MCHC: 33 g/dL (ref 31.5–36.0)
MCV: 88.9 fL (ref 79.5–101.0)
MONO#: 0.5 10*3/uL (ref 0.1–0.9)
MONO%: 6.1 % (ref 0.0–14.0)
NEUT%: 72.3 % (ref 38.4–76.8)
NEUTROS ABS: 5.7 10*3/uL (ref 1.5–6.5)
PLATELETS: 223 10*3/uL (ref 145–400)
RBC: 4.2 10*6/uL (ref 3.70–5.45)
RDW: 14.6 % — ABNORMAL HIGH (ref 11.2–14.5)
WBC: 7.9 10*3/uL (ref 3.9–10.3)
lymph#: 1.5 10*3/uL (ref 0.9–3.3)

## 2013-11-15 LAB — COMPREHENSIVE METABOLIC PANEL (CC13)
ALK PHOS: 88 U/L (ref 40–150)
ALT: 14 U/L (ref 0–55)
AST: 16 U/L (ref 5–34)
Albumin: 3.4 g/dL — ABNORMAL LOW (ref 3.5–5.0)
Anion Gap: 10 mEq/L (ref 3–11)
BILIRUBIN TOTAL: 0.69 mg/dL (ref 0.20–1.20)
BUN: 10.5 mg/dL (ref 7.0–26.0)
CO2: 27 mEq/L (ref 22–29)
CREATININE: 1.1 mg/dL (ref 0.6–1.1)
Calcium: 10 mg/dL (ref 8.4–10.4)
Chloride: 106 mEq/L (ref 98–109)
GLUCOSE: 84 mg/dL (ref 70–140)
Potassium: 3.9 mEq/L (ref 3.5–5.1)
SODIUM: 143 meq/L (ref 136–145)
Total Protein: 6.6 g/dL (ref 6.4–8.3)

## 2013-11-15 LAB — URINALYSIS, MICROSCOPIC - CHCC
BILIRUBIN (URINE): NEGATIVE
GLUCOSE UR CHCC: NEGATIVE mg/dL
KETONES: NEGATIVE mg/dL
Nitrite: NEGATIVE
Protein: 100 mg/dL
SPECIFIC GRAVITY, URINE: 1.015 (ref 1.003–1.035)
UROBILINOGEN UR: 0.2 mg/dL (ref 0.2–1)
pH: 6 (ref 4.6–8.0)

## 2013-11-15 LAB — PROTEIN / CREATININE RATIO, URINE
CREATININE, URINE: 30.8 mg/dL
Protein Creatinine Ratio: 2.76 — ABNORMAL HIGH (ref <0.15)
Total Protein, Urine: 85 mg/dL

## 2013-11-17 ENCOUNTER — Ambulatory Visit (HOSPITAL_BASED_OUTPATIENT_CLINIC_OR_DEPARTMENT_OTHER): Payer: Medicare Other | Admitting: Internal Medicine

## 2013-11-17 ENCOUNTER — Encounter: Payer: Self-pay | Admitting: Internal Medicine

## 2013-11-17 ENCOUNTER — Telehealth: Payer: Self-pay | Admitting: Internal Medicine

## 2013-11-17 ENCOUNTER — Ambulatory Visit (HOSPITAL_BASED_OUTPATIENT_CLINIC_OR_DEPARTMENT_OTHER): Payer: Medicare Other

## 2013-11-17 VITALS — BP 169/80 | HR 86 | Temp 97.5°F | Resp 18 | Ht 64.0 in | Wt 130.4 lb

## 2013-11-17 DIAGNOSIS — R0609 Other forms of dyspnea: Secondary | ICD-10-CM

## 2013-11-17 DIAGNOSIS — C3490 Malignant neoplasm of unspecified part of unspecified bronchus or lung: Secondary | ICD-10-CM

## 2013-11-17 DIAGNOSIS — C3410 Malignant neoplasm of upper lobe, unspecified bronchus or lung: Secondary | ICD-10-CM

## 2013-11-17 DIAGNOSIS — R197 Diarrhea, unspecified: Secondary | ICD-10-CM

## 2013-11-17 DIAGNOSIS — Z86711 Personal history of pulmonary embolism: Secondary | ICD-10-CM

## 2013-11-17 DIAGNOSIS — E538 Deficiency of other specified B group vitamins: Secondary | ICD-10-CM

## 2013-11-17 DIAGNOSIS — R21 Rash and other nonspecific skin eruption: Secondary | ICD-10-CM

## 2013-11-17 DIAGNOSIS — R0989 Other specified symptoms and signs involving the circulatory and respiratory systems: Secondary | ICD-10-CM

## 2013-11-17 MED ORDER — CYANOCOBALAMIN 1000 MCG/ML IJ SOLN
1000.0000 ug | Freq: Once | INTRAMUSCULAR | Status: AC
  Administered 2013-11-17: 1000 ug via INTRAMUSCULAR

## 2013-11-17 NOTE — Progress Notes (Signed)
[facility] Telephone:(336) [phone]   Fax:(336) [phone]  OFFICE PROGRESS NOTE  [name], NP [phone] [place] [address]  DIAGNOSIS AND STAGE:  #1 Recurrent non-small cell lung cancer consistent with adenocarcinoma. This was initially diagnosed as stage IB (T2a N0 M0) well-differentiated adenocarcinoma of bronchioalveolar type in December 2010.  #2 Acute pulmonary embolism in the right lower lobe lobar, segmental and subsegmental sized pulmonary arteries, diagnosed on 11/04/2011.   PRIOR THERAPY:  1.Status post right upper lobectomy on September 28, 2009 under the care of Dr. Arlyce Dice. The patient refused adjuvant chemotherapy at that time. She had disease recurrence in June of 2012.  2.Status post 4 cycles of systemic chemotherapy with carboplatin for an AUC of 6 and paclitaxel at 200 mg per meter squared and Avastin at 15 mg/kg according to the ECOG protocol #5508.  3. Chemotherapy with Avastin 15 mg/kg according to the ECOG protocol #5508 status post 32 cycles. Last cycle was given on 06/30/2013 discontinued secondary to disease progression and persistent proteinuria.  CURRENT THERAPY: Tarceva 150 mg by mouth daily, started 08/09/2013, status post 3 months of treatment.  CHEMOTHERAPY INTENT: Palliative.  CURRENT # OF CHEMOTHERAPY CYCLES: 4 CURRENT ANTIEMETICS: Compazine  CURRENT SMOKING STATUS: Non-smoker  ORAL CHEMOTHERAPY AND CONSENT: Tarceva and consent was signed 07/21/2013.  CURRENT BISPHOSPHONATES USE: None  PAIN MANAGEMENT: No pain  NARCOTICS INDUCED CONSTIPATION: No constipation.  LIVING WILL AND CODE STATUS: Full code   INTERVAL HISTORY: [name] 73 y.o. female returns to the clinic today for followup visit. The patient is feeling fine today except for mild skin rash mainly on the face. She also has intermittent episodes of diarrhea resolved with Imodium. She is concerned about increasing the hair of her eyebrows, mustache and  eyelashes after starting treatment with Tarceva. She denied having any significant nausea or vomiting. She denied having any chest pain but continues to have shortness of breath with exertion with no cough or hemoptysis. The patient denied having any fever or chills. She was started on treatment with Tarceva 3 months ago and tolerating it fairly well except for mild skin rash and few episodes of diarrhea. She had repeat CT scan of the chest performed recently and she is here for evaluation and discussion of her scan results.  MEDICAL HISTORY: Past Medical History  Diagnosis Date  . Lung cancer 03/08/2011    recurrent  . Hypertension   . Hypothyroidism   . Hypercholesterolemia   . Glaucoma   . Hip pain 09/02/2011  . Foot pain 09/02/2011    ALLERGIES:  is allergic to amitriptyline; codeine; and sulfa antibiotics.  MEDICATIONS:  Current Outpatient Prescriptions  Medication Sig Dispense Refill  . amLODipine (NORVASC) 5 MG tablet Take 10 mg by mouth daily. Per [name], NP      . citalopram (CELEXA) 40 MG tablet Take 40 mg by mouth daily.        . clindamycin (CLEOCIN T) 1 % lotion Apply topically 2 (two) times daily. Apply twice daily as needed  120 mL  0  . clonazePAM (KLONOPIN) 0.5 MG tablet Take 0.5 mg by mouth 2 (two) times daily as needed. Per [name], NP      . cyanocobalamin (,VITAMIN B-12,) 1000 MCG/ML injection Inject 1,000 mcg into the muscle every 30 (thirty) days.      . dorzolamide-timolol (COSOPT) 22.3-6.8 MG/ML ophthalmic solution 2 drops 2 (two) times daily. Per [name], NP       .  fish oil-omega-3 fatty acids 1000 MG capsule Take 2 g by mouth daily.      Marland Kitchen levothyroxine (SYNTHROID, LEVOTHROID) 75 MCG tablet Take 88 mcg by mouth daily. Per [name], NP      . lidocaine-prilocaine (EMLA) cream Apply 1 application topically as needed.        Marland Kitchen lisinopril (PRINIVIL,ZESTRIL) 40 MG tablet Take 40 mg by mouth daily. Per [name], NP       . loperamide  (IMODIUM) 2 MG capsule Take 2 mg by mouth as needed for diarrhea or loose stools.      . potassium chloride (K-DUR) 10 MEQ tablet Take 2 tablets (20 mEq total) by mouth 2 (two) times daily.  10 tablet  0  . Red Yeast Rice Extract (RED YEAST RICE PO) Take 1 capsule by mouth daily.      Marland Kitchen TARCEVA 150 MG tablet TAKE 1 TABLET BY MOUTH    DAILY ON EMPTY STOMACH  30 tablet  1  . prochlorperazine (COMPAZINE) 10 MG tablet Take 10 mg by mouth every 6 (six) hours as needed.         No current facility-administered medications for this visit.    SURGICAL HISTORY:  Past Surgical History  Procedure Laterality Date  . Video assisted thoracoscopy  09/28/2009  . Thoracotomy  09/28/2009    mini  . Lung lobectomy  09/28/2009    right upper    REVIEW OF SYSTEMS:  Constitutional: positive for fatigue Eyes: negative Ears, nose, mouth, throat, and face: negative Respiratory: negative Cardiovascular: negative Gastrointestinal: negative Genitourinary:negative Integument/breast: Acne-like skin rash on the face especially the nose area. Hematologic/lymphatic: negative Musculoskeletal:negative Neurological: positive for paresthesia Behavioral/Psych: negative Endocrine: negative Allergic/Immunologic: negative   PHYSICAL EXAMINATION: General appearance: alert, cooperative, fatigued and no distress Head: Normocephalic, without obvious abnormality, atraumatic Neck: no adenopathy, no JVD, supple, symmetrical, trachea midline and thyroid not enlarged, symmetric, no tenderness/mass/nodules Lymph nodes: Cervical, supraclavicular, and axillary nodes normal. Resp: clear to auscultation bilaterally Back: symmetric, no curvature. ROM normal. No CVA tenderness. Cardio: regular rate and rhythm, S1, S2 normal, no murmur, click, rub or gallop GI: soft, non-tender; bowel sounds normal; no masses,  no organomegaly Extremities: extremities normal, atraumatic, no cyanosis or edema Neurologic: Alert and oriented X 3,  normal strength and tone. Normal symmetric reflexes. Normal coordination and gait  ECOG PERFORMANCE STATUS: 1 - Symptomatic but completely ambulatory  Blood pressure 169/80, pulse 86, temperature 97.5 F (36.4 C), temperature source Oral, resp. rate 18, height 5\' 4"  (1.626 m), weight 130 lb 6.4 oz (59.149 kg), SpO2 97.00%.  LABORATORY DATA: Lab Results  Component Value Date   WBC 7.9 11/15/2013   HGB 12.3 11/15/2013   HCT 37.3 11/15/2013   MCV 88.9 11/15/2013   PLT 223 11/15/2013      Chemistry      Component Value Date/Time   NA 143 11/15/2013 1116   NA 136 05/20/2012 1416   NA 142 03/08/2011 0754   K 3.9 11/15/2013 1116   K 4.4 05/20/2012 1416   K 4.2 03/08/2011 0754   CL 107 03/30/2013 1036   CL 99 05/20/2012 1416   CL 102 03/08/2011 0754   CO2 27 11/15/2013 1116   CO2 26 05/20/2012 1416   CO2 27 03/08/2011 0754   BUN 10.5 11/15/2013 1116   BUN 6 05/20/2012 1416   BUN 9 03/08/2011 0754   CREATININE 1.1 11/15/2013 1116   CREATININE 1.11* 05/20/2012 1416   CREATININE 1.0 03/08/2011 0754  Component Value Date/Time   CALCIUM 10.0 11/15/2013 1116   CALCIUM 10.3 05/20/2012 1416   CALCIUM 9.5 03/08/2011 0754   ALKPHOS 88 11/15/2013 1116   ALKPHOS 71 05/20/2012 1416   ALKPHOS 59 03/08/2011 0754   AST 16 11/15/2013 1116   AST 9 05/20/2012 1416   AST 19 03/08/2011 0754   ALT 14 11/15/2013 1116   ALT <5 05/20/2012 1416   ALT 18 03/08/2011 0754   BILITOT 0.69 11/15/2013 1116   BILITOT 0.7 05/20/2012 1416   BILITOT 0.50 03/08/2011 0754     RADIOLOGY RESULTS: Ct Chest Wo Contrast  11/15/2013   CLINICAL DATA:  Followup lung cancer.  EXAM: CT CHEST WITHOUT CONTRAST  TECHNIQUE: Multidetector CT imaging of the chest was performed following the standard protocol without IV contrast.  COMPARISON:  07/20/2013  RECIST Protocol 1.1 Lesions:  1. Posterior aspect of the right lower lobe (image 40 of series 5),ground-glass attenuation nodule is stable measuring 2.1 cm.  2. Superior segment of the left lower lobe ( image 25 of series 5),  multi-cystic and ground-glass attenuation nodule measures 5.5 cm, image 25/series 5. Previously this measured 5.3 cm. Next  FINDINGS: FINDINGS No pleural effusion identified. Postoperative changes in volume loss within the right upper lobe are again noted. Multifocal, bilateral sub solid lesions are identified. The index lesion in the superior segment of left lower lobe measures 5.5 cm, image 25/series 5. Previously 5.3 cm. The sub solid lesion within the medial aspect of the right lower lobe measures 3 cm, image 32/ series 5. Previously this measured 3.1 cm. Closer to the base, right lower lobe nodule measures 1 cm, compared with 1.3 cm previously, image 41/series 5. Within the posterior right lower lobe there is a sub solid lesion which measures 2.1 cm, image 40/series 5. Previously this measured the same. No new pulmonary lesions identified stable scarring in the left upper lobe. There is a calcified granuloma identified in the left upper lobe.  The trachea appears patent and is midline. The heart size is normal. Calcified atherosclerotic disease affects the thoracic aorta. There is no aneurysm identified. There is also calcified atherosclerotic change involving the LAD and left circumflex coronary arteries. Calcified left hilar lymph nodes are identified. No mediastinal or hilar adenopathy identified.  No enlarged axillary or supraclavicular lymph nodes. Multiple calcified granulomas are noted within the liver and spleen. No acute findings identified within the upper abdomen.  Mild multi level thoracic spondylosis identified. No aggressive lytic or sclerotic bone lesions.  IMPRESSION: 1. No acute cardiopulmonary abnormalities. 2. Target lesion 1 is stable when compared with the previous exam. The second target lesion within the superior segment of left lower lobe it is mildly increased in the interval. 3. There is a non target lesion within the right lower lobe which has decreased in size from previous exam. 4.  No new pulmonary lesions identified. 5. Prior granulomatous disease 6. Atherosclerotic disease including multi vessel coronary artery calcifications.   Electronically Signed   By: Kerby Moors M.D.   On: 11/15/2013 14:13   ASSESSMENT AND PLAN: This is a very pleasant 73 years old [place] American female with recurrent non-small cell lung cancer most recently treated with maintenance Avastin status post 32 cycles. She is currently on treatment with single agent Tarceva 150 mg by mouth daily status post 3 months and tolerating her treatment fairly well except for mild skin rash and occasional diarrhea. Her recent scan showed stable disease with mild increase in the size of  the superior segment left lower lobe nodule and decrease in the size of other nodules. I discussed the scan results with the patient today. I recommended for her to continue her treatment with Tarceva 150 mg by mouth daily. I recommended for the patient to continue treatment with Imodium for the diarrhea on as needed basis. For the skin rash, she will continue to apply the clindamycin 1% lotion to the skin rash area twice a day. She would come back for followup visit in one month. The patient was advised to call immediately if she has any concerning symptoms in the interval. The patient voices understanding of current disease status and treatment options and is in agreement with the current care plan.  All questions were answered. The patient knows to call the clinic with any problems, questions or concerns. We can certainly see the patient much sooner if necessary. I spent 15 minutes of face-to-face counseling with the patient today of the total visit time 25 minutes.  Disclaimer: This note was dictated with voice recognition software. Similar sounding words can inadvertently be transcribed and may not be corrected upon review.

## 2013-11-17 NOTE — Patient Instructions (Signed)
Continue treatment with Tarceva Followup visit in one month.

## 2013-11-17 NOTE — Telephone Encounter (Signed)
Gave pt appt for lab and MD on March 2015

## 2013-11-23 ENCOUNTER — Other Ambulatory Visit: Payer: Self-pay | Admitting: *Deleted

## 2013-11-23 DIAGNOSIS — C3490 Malignant neoplasm of unspecified part of unspecified bronchus or lung: Secondary | ICD-10-CM

## 2013-11-24 ENCOUNTER — Other Ambulatory Visit: Payer: Self-pay | Admitting: Family Medicine

## 2013-11-29 ENCOUNTER — Other Ambulatory Visit: Payer: Self-pay | Admitting: Family

## 2013-11-29 DIAGNOSIS — N63 Unspecified lump in unspecified breast: Secondary | ICD-10-CM

## 2013-12-08 ENCOUNTER — Ambulatory Visit
Admission: RE | Admit: 2013-12-08 | Discharge: 2013-12-08 | Disposition: A | Discharge status: Home / Self Care | Payer: Medicare Other | Source: Ambulatory Visit | Attending: Family | Admitting: Family

## 2013-12-08 ENCOUNTER — Ambulatory Visit
Admission: RE | Admit: 2013-12-08 | Discharge: 2013-12-08 | Disposition: A | Discharge status: Home / Self Care | Payer: Self-pay | Source: Ambulatory Visit | Attending: Family | Admitting: Family

## 2013-12-08 DIAGNOSIS — N63 Unspecified lump in unspecified breast: Secondary | ICD-10-CM

## 2013-12-15 ENCOUNTER — Other Ambulatory Visit: Payer: Self-pay | Admitting: Internal Medicine

## 2013-12-15 ENCOUNTER — Telehealth: Payer: Self-pay | Admitting: Internal Medicine

## 2013-12-15 ENCOUNTER — Ambulatory Visit: Payer: Medicare Other | Admitting: Physician Assistant

## 2013-12-15 ENCOUNTER — Other Ambulatory Visit: Payer: Medicare Other

## 2013-12-15 NOTE — Telephone Encounter (Signed)
pt called to r/s cx appt....done pt aware of new d.t

## 2013-12-15 NOTE — Telephone Encounter (Signed)
Call to scheduler to follow up on 1 month appointment with [facility] that is due in March.

## 2013-12-17 ENCOUNTER — Ambulatory Visit (HOSPITAL_BASED_OUTPATIENT_CLINIC_OR_DEPARTMENT_OTHER): Payer: Medicare Other | Admitting: Physician Assistant

## 2013-12-17 ENCOUNTER — Ambulatory Visit (HOSPITAL_BASED_OUTPATIENT_CLINIC_OR_DEPARTMENT_OTHER): Payer: Medicare Other

## 2013-12-17 ENCOUNTER — Encounter: Payer: Self-pay | Admitting: Physician Assistant

## 2013-12-17 ENCOUNTER — Telehealth: Payer: Self-pay | Admitting: Internal Medicine

## 2013-12-17 ENCOUNTER — Other Ambulatory Visit (HOSPITAL_BASED_OUTPATIENT_CLINIC_OR_DEPARTMENT_OTHER): Payer: Medicare Other

## 2013-12-17 VITALS — BP 154/83 | HR 88 | Temp 97.5°F | Resp 17 | Ht 64.0 in | Wt 132.4 lb

## 2013-12-17 DIAGNOSIS — E538 Deficiency of other specified B group vitamins: Secondary | ICD-10-CM

## 2013-12-17 DIAGNOSIS — R197 Diarrhea, unspecified: Secondary | ICD-10-CM

## 2013-12-17 DIAGNOSIS — R21 Rash and other nonspecific skin eruption: Secondary | ICD-10-CM

## 2013-12-17 DIAGNOSIS — C3490 Malignant neoplasm of unspecified part of unspecified bronchus or lung: Secondary | ICD-10-CM

## 2013-12-17 DIAGNOSIS — C3410 Malignant neoplasm of upper lobe, unspecified bronchus or lung: Secondary | ICD-10-CM

## 2013-12-17 DIAGNOSIS — Z86711 Personal history of pulmonary embolism: Secondary | ICD-10-CM

## 2013-12-17 LAB — CBC WITH DIFFERENTIAL/PLATELET
BASO%: 0.7 % (ref 0.0–2.0)
Basophils Absolute: 0.1 10*3/uL (ref 0.0–0.1)
EOS ABS: 0.2 10*3/uL (ref 0.0–0.5)
EOS%: 1.9 % (ref 0.0–7.0)
HCT: 37.1 % (ref 34.8–46.6)
HGB: 11.9 g/dL (ref 11.6–15.9)
LYMPH%: 17.2 % (ref 14.0–49.7)
MCH: 28.7 pg (ref 25.1–34.0)
MCHC: 32.2 g/dL (ref 31.5–36.0)
MCV: 89.2 fL (ref 79.5–101.0)
MONO#: 0.6 10*3/uL (ref 0.1–0.9)
MONO%: 6.8 % (ref 0.0–14.0)
NEUT%: 73.4 % (ref 38.4–76.8)
NEUTROS ABS: 6.9 10*3/uL — AB (ref 1.5–6.5)
PLATELETS: 196 10*3/uL (ref 145–400)
RBC: 4.16 10*6/uL (ref 3.70–5.45)
RDW: 14.9 % — ABNORMAL HIGH (ref 11.2–14.5)
WBC: 9.4 10*3/uL (ref 3.9–10.3)
lymph#: 1.6 10*3/uL (ref 0.9–3.3)

## 2013-12-17 LAB — COMPREHENSIVE METABOLIC PANEL (CC13)
ALT: 12 U/L (ref 0–55)
AST: 15 U/L (ref 5–34)
Albumin: 3.5 g/dL (ref 3.5–5.0)
Alkaline Phosphatase: 92 U/L (ref 40–150)
Anion Gap: 11 mEq/L (ref 3–11)
BILIRUBIN TOTAL: 0.77 mg/dL (ref 0.20–1.20)
BUN: 12.1 mg/dL (ref 7.0–26.0)
CO2: 28 mEq/L (ref 22–29)
CREATININE: 1.1 mg/dL (ref 0.6–1.1)
Calcium: 10.3 mg/dL (ref 8.4–10.4)
Chloride: 105 mEq/L (ref 98–109)
GLUCOSE: 79 mg/dL (ref 70–140)
Potassium: 3.6 mEq/L (ref 3.5–5.1)
Sodium: 144 mEq/L (ref 136–145)
Total Protein: 6.9 g/dL (ref 6.4–8.3)

## 2013-12-17 LAB — PROTEIN / CREATININE RATIO, URINE
Creatinine, Urine: 54.3 mg/dL
PROTEIN CREATININE RATIO: 1.09 — AB (ref <0.15)
Total Protein, Urine: 59 mg/dL

## 2013-12-17 LAB — UA PROTEIN, DIPSTICK - CHCC: PROTEIN: 100 mg/dL

## 2013-12-17 MED ORDER — CYANOCOBALAMIN 1000 MCG/ML IJ SOLN
1000.0000 ug | Freq: Once | INTRAMUSCULAR | Status: AC
  Administered 2013-12-17: 1000 ug via INTRAMUSCULAR

## 2013-12-17 MED ORDER — HEPARIN SOD (PORK) LOCK FLUSH 100 UNIT/ML IV SOLN
500.0000 [IU] | Freq: Once | INTRAVENOUS | Status: AC
  Administered 2013-12-17: 500 [IU] via INTRAVENOUS
  Filled 2013-12-17: qty 5

## 2013-12-17 MED ORDER — SODIUM CHLORIDE 0.9 % IJ SOLN
10.0000 mL | INTRAMUSCULAR | Status: DC | PRN
  Administered 2013-12-17: 10 mL via INTRAVENOUS
  Filled 2013-12-17: qty 10

## 2013-12-17 NOTE — Telephone Encounter (Signed)
gv pt appt schedule for april and may.

## 2013-12-17 NOTE — Patient Instructions (Addendum)

## 2013-12-17 NOTE — Progress Notes (Signed)
[facility] [phone]   Fax:([phone]) [phone]  OFFICE PROGRESS NOTE  [name], NP [phone] [place] [address]  DIAGNOSIS AND STAGE:  #1 Recurrent non-small cell lung cancer consistent with adenocarcinoma. This was initially diagnosed as stage IB (T2a N0 M0) well-differentiated adenocarcinoma of bronchioalveolar type in December 2010.  #2 Acute pulmonary embolism in the right lower lobe lobar, segmental and subsegmental sized pulmonary arteries, diagnosed on 11/04/2011.   PRIOR THERAPY:  1.Status post right upper lobectomy on September 28, 2009 under the care of Dr. Arlyce Dice. The patient refused adjuvant chemotherapy at that time. She had disease recurrence in June of 2012.  2.Status post 4 cycles of systemic chemotherapy with carboplatin for an AUC of 6 and paclitaxel at 200 mg per meter squared and Avastin at 15 mg/kg according to the ECOG protocol #5508.  3. Chemotherapy with Avastin 15 mg/kg according to the ECOG protocol #5508 status post 32 cycles. Last cycle was given on 06/30/2013 discontinued secondary to disease progression and persistent proteinuria.  CURRENT THERAPY: Tarceva 150 mg by mouth daily, started 08/09/2013, status post 4 months of treatment.  CHEMOTHERAPY INTENT: Palliative.  CURRENT # OF CHEMOTHERAPY CYCLES: 5 CURRENT ANTIEMETICS: Compazine  CURRENT SMOKING STATUS: Non-smoker  ORAL CHEMOTHERAPY AND CONSENT: Tarceva and consent was signed 07/21/2013.  CURRENT BISPHOSPHONATES USE: None  PAIN MANAGEMENT: No pain  NARCOTICS INDUCED CONSTIPATION: No constipation.  LIVING WILL AND CODE STATUS: Full code   INTERVAL HISTORY: TAMANIKA HEINEY 73 y.o. female returns to the clinic today for followup visit. The patient is feeling fine today except for mild skin rash mainly on the face. Her skin is very dry. She's had increased growth of her eyebrows, eyelashes as well as a "mustache". She also has intermittent episodes of  diarrhea resolved with Imodium.  She denied having any significant nausea or vomiting. She denied having any chest pain but continues to have shortness of breath with exertion with no cough or hemoptysis. The patient denied having any fever or chills. She was started on treatment with Tarceva 4 months ago and tolerating it fairly well except for mild skin rash and few episodes of diarrhea. She reports that she recently had an ultrasound of her left breast for an area of that seemed a little firmer than the rest of her breast tissue. The report feels that this may be consistent with fat necrosis. She is to return in 6 months for another diagnostic mammogram and ultrasound or sooner should she notice any concerning differences.   MEDICAL HISTORY: Past Medical History  Diagnosis Date  . Lung cancer 03/08/2011    recurrent  . Hypertension   . Hypothyroidism   . Hypercholesterolemia   . Glaucoma   . Hip pain 09/02/2011  . Foot pain 09/02/2011    ALLERGIES:  is allergic to amitriptyline; codeine; and sulfa antibiotics.  MEDICATIONS:  Current Outpatient Prescriptions  Medication Sig Dispense Refill  . amLODipine (NORVASC) 5 MG tablet Take 10 mg by mouth daily. Per [name], NP      . clindamycin (CLEOCIN T) 1 % lotion Apply topically 2 (two) times daily. Apply twice daily as needed  120 mL  0  . clonazePAM (KLONOPIN) 0.5 MG tablet Take 0.5 mg by mouth 2 (two) times daily as needed. Per [name], NP      . cyanocobalamin (,VITAMIN B-12,) 1000 MCG/ML injection Inject 1,000 mcg into the muscle every 30 (thirty) days.      Marland Kitchen  dorzolamide-timolol (COSOPT) 22.3-6.8 MG/ML ophthalmic solution 2 drops 2 (two) times daily. Per [name], NP       . fish oil-omega-3 fatty acids 1000 MG capsule Take 2 g by mouth daily.      Marland Kitchen levothyroxine (SYNTHROID, LEVOTHROID) 75 MCG tablet Take 88 mcg by mouth daily. Per [name], NP      . lidocaine-prilocaine (EMLA) cream Apply 1 application topically  as needed.        Marland Kitchen lisinopril (PRINIVIL,ZESTRIL) 40 MG tablet Take 40 mg by mouth daily. Per [name], NP       . loperamide (IMODIUM) 2 MG capsule Take 2 mg by mouth as needed for diarrhea or loose stools.      . potassium chloride (K-DUR) 10 MEQ tablet Take 2 tablets (20 mEq total) by mouth 2 (two) times daily.  10 tablet  0  . prochlorperazine (COMPAZINE) 10 MG tablet Take 10 mg by mouth every 6 (six) hours as needed.        . Red Yeast Rice Extract (RED YEAST RICE PO) Take 1 capsule by mouth daily.      Marland Kitchen TARCEVA 150 MG tablet TAKE 1 TABLET BY MOUTH    DAILY ON EMPTY STOMACH  30 tablet  1  . citalopram (CELEXA) 40 MG tablet Take 40 mg by mouth daily.         Current Facility-Administered Medications  Medication Dose Route Frequency Provider Last Rate Last Dose  . sodium chloride 0.9 % injection 10 mL  10 mL Intravenous PRN Curt Bears, MD   10 mL at 12/17/13 1416    SURGICAL HISTORY:  Past Surgical History  Procedure Laterality Date  . Video assisted thoracoscopy  09/28/2009  . Thoracotomy  09/28/2009    mini  . Lung lobectomy  09/28/2009    right upper    REVIEW OF SYSTEMS:  Constitutional: positive for fatigue Eyes: negative Ears, nose, mouth, throat, and face: negative Respiratory: negative Cardiovascular: negative Gastrointestinal: negative Genitourinary:negative Integument/breast: Acne-like skin rash on the face especially the nose area. Hematologic/lymphatic: negative Musculoskeletal:negative Neurological: positive for paresthesia Behavioral/Psych: negative Endocrine: negative Allergic/Immunologic: negative   PHYSICAL EXAMINATION: General appearance: alert, cooperative, fatigued and no distress Head: Normocephalic, without obvious abnormality, atraumatic Neck: no adenopathy, no JVD, supple, symmetrical, trachea midline and thyroid not enlarged, symmetric, no tenderness/mass/nodules Lymph nodes: Cervical, supraclavicular, and axillary nodes normal. Resp:  clear to auscultation bilaterally Back: symmetric, no curvature. ROM normal. No CVA tenderness. Cardio: regular rate and rhythm, S1, S2 normal, no murmur, click, rub or gallop GI: soft, non-tender; bowel sounds normal; no masses,  no organomegaly Extremities: extremities normal, atraumatic, no cyanosis or edema Neurologic: Alert and oriented X 3, normal strength and tone. Normal symmetric reflexes. Normal coordination and gait Hair distribution along the eyebrows and eyelashes is increased as well asa noticeable "mustache".  ECOG PERFORMANCE STATUS: 1 - Symptomatic but completely ambulatory  Blood pressure 154/83, pulse 88, temperature 97.5 F (36.4 C), temperature source Oral, resp. rate 17, height '5\' 4"'  (1.626 m), weight 132 lb 6.4 oz (60.056 kg), SpO2 100.00%.  LABORATORY DATA: Lab Results  Component Value Date   WBC 9.4 12/17/2013   HGB 11.9 12/17/2013   HCT 37.1 12/17/2013   MCV 89.2 12/17/2013   PLT 196 12/17/2013      Chemistry      Component Value Date/Time   NA 144 12/17/2013 1321   NA 136 05/20/2012 1416   NA 142 03/08/2011 0754   K 3.6 12/17/2013 1321  K 4.4 05/20/2012 1416   K 4.2 03/08/2011 0754   CL 107 03/30/2013 1036   CL 99 05/20/2012 1416   CL 102 03/08/2011 0754   CO2 28 12/17/2013 1321   CO2 26 05/20/2012 1416   CO2 27 03/08/2011 0754   BUN 12.1 12/17/2013 1321   BUN 6 05/20/2012 1416   BUN 9 03/08/2011 0754   CREATININE 1.1 12/17/2013 1321   CREATININE 1.11* 05/20/2012 1416   CREATININE 1.0 03/08/2011 0754      Component Value Date/Time   CALCIUM 10.3 12/17/2013 1321   CALCIUM 10.3 05/20/2012 1416   CALCIUM 9.5 03/08/2011 0754   ALKPHOS 92 12/17/2013 1321   ALKPHOS 71 05/20/2012 1416   ALKPHOS 59 03/08/2011 0754   AST 15 12/17/2013 1321   AST 9 05/20/2012 1416   AST 19 03/08/2011 0754   ALT 12 12/17/2013 1321   ALT <5 05/20/2012 1416   ALT 18 03/08/2011 0754   BILITOT 0.77 12/17/2013 1321   BILITOT 0.7 05/20/2012 1416   BILITOT 0.50 03/08/2011 0754     RADIOLOGY RESULTS: Ct Chest Wo  Contrast  11/15/2013   CLINICAL DATA:  Followup lung cancer.  EXAM: CT CHEST WITHOUT CONTRAST  TECHNIQUE: Multidetector CT imaging of the chest was performed following the standard protocol without IV contrast.  COMPARISON:  07/20/2013  RECIST Protocol 1.1 Lesions:  1. Posterior aspect of the right lower lobe (image 40 of series 5),ground-glass attenuation nodule is stable measuring 2.1 cm.  2. Superior segment of the left lower lobe ( image 25 of series 5), multi-cystic and ground-glass attenuation nodule measures 5.5 cm, image 25/series 5. Previously this measured 5.3 cm. Next  FINDINGS: FINDINGS No pleural effusion identified. Postoperative changes in volume loss within the right upper lobe are again noted. Multifocal, bilateral sub solid lesions are identified. The index lesion in the superior segment of left lower lobe measures 5.5 cm, image 25/series 5. Previously 5.3 cm. The sub solid lesion within the medial aspect of the right lower lobe measures 3 cm, image 32/ series 5. Previously this measured 3.1 cm. Closer to the base, right lower lobe nodule measures 1 cm, compared with 1.3 cm previously, image 41/series 5. Within the posterior right lower lobe there is a sub solid lesion which measures 2.1 cm, image 40/series 5. Previously this measured the same. No new pulmonary lesions identified stable scarring in the left upper lobe. There is a calcified granuloma identified in the left upper lobe.  The trachea appears patent and is midline. The heart size is normal. Calcified atherosclerotic disease affects the thoracic aorta. There is no aneurysm identified. There is also calcified atherosclerotic change involving the LAD and left circumflex coronary arteries. Calcified left hilar lymph nodes are identified. No mediastinal or hilar adenopathy identified.  No enlarged axillary or supraclavicular lymph nodes. Multiple calcified granulomas are noted within the liver and spleen. No acute findings identified within  the upper abdomen.  Mild multi level thoracic spondylosis identified. No aggressive lytic or sclerotic bone lesions.  IMPRESSION: 1. No acute cardiopulmonary abnormalities. 2. Target lesion 1 is stable when compared with the previous exam. The second target lesion within the superior segment of left lower lobe it is mildly increased in the interval. 3. There is a non target lesion within the right lower lobe which has decreased in size from previous exam. 4. No new pulmonary lesions identified. 5. Prior granulomatous disease 6. Atherosclerotic disease including multi vessel coronary artery calcifications.   Electronically Signed  By: Kerby Moors M.D.   On: 11/15/2013 14:13   ASSESSMENT AND PLAN: This is a very pleasant 73 years old [place] American female with recurrent non-small cell lung cancer most recently treated with maintenance Avastin status post 32 cycles. She is currently on treatment with single agent Tarceva 150 mg by mouth daily status post 4 months and tolerating her treatment fairly well except for mild skin rash and occasional diarrhea. Her recent scan showed stable disease with mild increase in the size of the superior segment left lower lobe nodule and decrease in the size of other nodules. She will continue her treatment with Tarceva 150 mg by mouth daily. The patient to continue treatment with Imodium for the diarrhea on as needed basis. For the skin rash, she will continue to apply the clindamycin 1% lotion to the skin rash area twice a day. She will followup in one month for another symptom management visit with repeat CBC differential and C. Met. Patient was advised to followup with the breast Center regarding followup diagnostic mammogram and ultrasound of her left breast The patient was advised to call immediately if she has any concerning symptoms in the interval. The patient voices understanding of current disease status and treatment options and is in agreement with the  current care plan.  All questions were answered. The patient knows to call the clinic with any problems, questions or concerns. We can certainly see the patient much sooner if necessary. I spent 20 minutes of face-to-face counseling with the patient today of the total visit time 30 minutes.  Disclaimer: This note was dictated with voice recognition software. Similar sounding words can inadvertently be transcribed and may not be corrected upon review.

## 2013-12-21 ENCOUNTER — Other Ambulatory Visit: Payer: Self-pay | Admitting: *Deleted

## 2013-12-21 DIAGNOSIS — C3490 Malignant neoplasm of unspecified part of unspecified bronchus or lung: Secondary | ICD-10-CM

## 2014-01-13 ENCOUNTER — Ambulatory Visit: Payer: Medicare Other | Admitting: Internal Medicine

## 2014-01-13 ENCOUNTER — Ambulatory Visit: Payer: Medicare Other

## 2014-01-13 ENCOUNTER — Other Ambulatory Visit: Payer: Medicare Other

## 2014-01-17 ENCOUNTER — Telehealth: Payer: Self-pay | Admitting: Internal Medicine

## 2014-01-17 NOTE — Telephone Encounter (Signed)
Pt called and r/s lab and MD visit, talked to Dr. Julien Nordmann and he wants pt to be seen this week by ML

## 2014-01-18 ENCOUNTER — Ambulatory Visit (HOSPITAL_BASED_OUTPATIENT_CLINIC_OR_DEPARTMENT_OTHER): Payer: Medicare Other | Admitting: Physician Assistant

## 2014-01-18 ENCOUNTER — Ambulatory Visit: Payer: Medicare Other

## 2014-01-18 ENCOUNTER — Other Ambulatory Visit: Payer: Medicare Other

## 2014-01-18 ENCOUNTER — Telehealth: Payer: Self-pay | Admitting: Internal Medicine

## 2014-01-18 ENCOUNTER — Encounter: Payer: Self-pay | Admitting: Physician Assistant

## 2014-01-18 ENCOUNTER — Other Ambulatory Visit (HOSPITAL_BASED_OUTPATIENT_CLINIC_OR_DEPARTMENT_OTHER): Payer: Medicare Other

## 2014-01-18 VITALS — BP 131/47 | HR 66 | Temp 98.1°F | Resp 18 | Ht 64.0 in | Wt 134.8 lb

## 2014-01-18 DIAGNOSIS — R197 Diarrhea, unspecified: Secondary | ICD-10-CM

## 2014-01-18 DIAGNOSIS — C3410 Malignant neoplasm of upper lobe, unspecified bronchus or lung: Secondary | ICD-10-CM

## 2014-01-18 DIAGNOSIS — C3490 Malignant neoplasm of unspecified part of unspecified bronchus or lung: Secondary | ICD-10-CM

## 2014-01-18 DIAGNOSIS — R21 Rash and other nonspecific skin eruption: Secondary | ICD-10-CM

## 2014-01-18 DIAGNOSIS — Z86711 Personal history of pulmonary embolism: Secondary | ICD-10-CM

## 2014-01-18 DIAGNOSIS — E538 Deficiency of other specified B group vitamins: Secondary | ICD-10-CM

## 2014-01-18 LAB — COMPREHENSIVE METABOLIC PANEL (CC13)
ALT: 7 U/L (ref 0–55)
ANION GAP: 9 meq/L (ref 3–11)
AST: 11 U/L (ref 5–34)
Albumin: 3.4 g/dL — ABNORMAL LOW (ref 3.5–5.0)
Alkaline Phosphatase: 94 U/L (ref 40–150)
BILIRUBIN TOTAL: 0.74 mg/dL (ref 0.20–1.20)
BUN: 11.8 mg/dL (ref 7.0–26.0)
CO2: 26 meq/L (ref 22–29)
Calcium: 9.7 mg/dL (ref 8.4–10.4)
Chloride: 107 mEq/L (ref 98–109)
Creatinine: 1.2 mg/dL — ABNORMAL HIGH (ref 0.6–1.1)
Glucose: 92 mg/dl (ref 70–140)
Potassium: 3.6 mEq/L (ref 3.5–5.1)
SODIUM: 143 meq/L (ref 136–145)
TOTAL PROTEIN: 6.7 g/dL (ref 6.4–8.3)

## 2014-01-18 LAB — PROTEIN / CREATININE RATIO, URINE
Creatinine, Urine: 266 mg/dL
Protein Creatinine Ratio: 0.23 — ABNORMAL HIGH (ref <0.15)
Total Protein, Urine: 62 mg/dL

## 2014-01-18 LAB — CBC WITH DIFFERENTIAL/PLATELET
BASO%: 0.3 % (ref 0.0–2.0)
Basophils Absolute: 0 10*3/uL (ref 0.0–0.1)
EOS%: 1.6 % (ref 0.0–7.0)
Eosinophils Absolute: 0.1 10*3/uL (ref 0.0–0.5)
HCT: 36.1 % (ref 34.8–46.6)
HGB: 11.5 g/dL — ABNORMAL LOW (ref 11.6–15.9)
LYMPH%: 16 % (ref 14.0–49.7)
MCH: 28.6 pg (ref 25.1–34.0)
MCHC: 31.9 g/dL (ref 31.5–36.0)
MCV: 89.8 fL (ref 79.5–101.0)
MONO#: 0.5 10*3/uL (ref 0.1–0.9)
MONO%: 5.8 % (ref 0.0–14.0)
NEUT#: 6.5 10*3/uL (ref 1.5–6.5)
NEUT%: 76.3 % (ref 38.4–76.8)
Platelets: 183 10*3/uL (ref 145–400)
RBC: 4.02 10*6/uL (ref 3.70–5.45)
RDW: 14.5 % (ref 11.2–14.5)
WBC: 8.6 10*3/uL (ref 3.9–10.3)
lymph#: 1.4 10*3/uL (ref 0.9–3.3)

## 2014-01-18 LAB — UA PROTEIN, DIPSTICK - CHCC: PROTEIN: 30 mg/dL

## 2014-01-18 MED ORDER — CYANOCOBALAMIN 1000 MCG/ML IJ SOLN
1000.0000 ug | Freq: Once | INTRAMUSCULAR | Status: AC
  Administered 2014-01-18: 1000 ug via INTRAMUSCULAR

## 2014-01-18 MED ORDER — DIPHENOXYLATE-ATROPINE 2.5-0.025 MG PO TABS: ORAL_TABLET | ORAL | Status: DC

## 2014-01-18 MED ORDER — HEPARIN SOD (PORK) LOCK FLUSH 100 UNIT/ML IV SOLN
500.0000 [IU] | Freq: Once | INTRAVENOUS | Status: AC
  Administered 2014-01-18: 500 [IU] via INTRAVENOUS
  Filled 2014-01-18: qty 5

## 2014-01-18 MED ORDER — SODIUM CHLORIDE 0.9 % IJ SOLN
10.0000 mL | INTRAMUSCULAR | Status: DC | PRN
  Administered 2014-01-18: 10 mL via INTRAVENOUS
  Filled 2014-01-18: qty 10

## 2014-01-18 NOTE — Telephone Encounter (Signed)
Gave pt appt for lab and MD for May 2015 °

## 2014-01-18 NOTE — Patient Instructions (Addendum)
Implanted Port Home Guide An implanted port is a type of central line that is placed under the skin. Central lines are used to provide IV access when treatment or nutrition needs to be given through a person's veins. Implanted ports are used for long-term IV access. An implanted port may be placed because:   You need IV medicine that would be irritating to the small veins in your hands or arms.   You need long-term IV medicines, such as antibiotics.   You need IV nutrition for a long period.   You need frequent blood draws for lab tests.   You need dialysis.  Implanted ports are usually placed in the chest area, but they can also be placed in the upper arm, the abdomen, or the leg. An implanted port has two main parts:   Reservoir. The reservoir is round and will appear as a small, raised area under your skin. The reservoir is the part where a needle is inserted to give medicines or draw blood.   Catheter. The catheter is a thin, flexible tube that extends from the reservoir. The catheter is placed into a large vein. Medicine that is inserted into the reservoir goes into the catheter and then into the vein.  HOW WILL I CARE FOR MY INCISION SITE? Do not get the incision site wet. Bathe or shower as directed by your health care provider.  HOW IS MY PORT ACCESSED? Special steps must be taken to access the port:   Before the port is accessed, a numbing cream can be placed on the skin. This helps numb the skin over the port site.   Your health care provider uses a sterile technique to access the port.  Your health care provider must put on a mask and sterile gloves.  The skin over your port is cleaned carefully with an antiseptic and allowed to dry.  The port is gently pinched between sterile gloves, and a needle is inserted into the port.  Only "non-coring" port needles should be used to access the port. Once the port is accessed, a blood return should be checked. This helps  ensure that the port is in the vein and is not clogged.   If your port needs to remain accessed for a constant infusion, a clear (transparent) bandage will be placed over the needle site. The bandage and needle will need to be changed every week, or as directed by your health care provider.   Keep the bandage covering the needle clean and dry. Do not get it wet. Follow your health care provider's instructions on how to take a shower or bath while the port is accessed.   If your port does not need to stay accessed, no bandage is needed over the port.  WHAT IS FLUSHING? Flushing helps keep the port from getting clogged. Follow your health care provider's instructions on how and when to flush the port. Ports are usually flushed with saline solution or a medicine called heparin. The need for flushing will depend on how the port is used.   If the port is used for intermittent medicines or blood draws, the port will need to be flushed:   After medicines have been given.   After blood has been drawn.   As part of routine maintenance.   If a constant infusion is running, the port may not need to be flushed.  HOW LONG WILL MY PORT STAY IMPLANTED? The port can stay in for as long as your health care   provider thinks it is needed. When it is time for the port to come out, surgery will be done to remove it. The procedure is similar to the one performed when the port was put in.  WHEN SHOULD I SEEK IMMEDIATE MEDICAL CARE? When you have an implanted port, you should seek immediate medical care if:   You notice a bad smell coming from the incision site.   You have swelling, redness, or drainage at the incision site.   You have more swelling or pain at the port site or the surrounding area.   You have a fever that is not controlled with medicine. Document Released: 09/30/2005 Document Revised: 07/21/2013 Document Reviewed: 06/07/2013 [facility] Patient Information 2014 [place]. Vitamin B12 Injections Every person needs vitamin B12. A deficiency develops when the body does not get enough of it. One way to overcome this is by getting B12 shots (injections). A B12 shot puts the vitamin directly into muscle tissue. This avoids any problems your body might have in absorbing it from food or a pill. In some people, the body has trouble using the vitamin correctly. This can cause a B12 deficiency. Not consuming enough of the vitamin can also cause a deficiency. Getting enough vitamin B12 can be hard for elderly people. Sometimes, they do not eat a well-balanced diet. The elderly are also more likely than younger people to have medical conditions or take medications that can lead to a deficiency. WHAT DOES VITAMIN B12 DO? Vitamin B12 does many things to help the body work right:  It helps the body make healthy red blood cells.  It helps maintain nerve cells.  It is involved in the body's process of converting food into energy (metabolism).  It is needed to make the genetic material in all cells (DNA). VITAMIN B12 FOOD SOURCES Most people get plenty of vitamin B12 through the foods they eat. It is present in:  Meat, fish, poultry, and eggs.  Milk and milk products.  It also is added when certain foods are made, including some breads, cereals and yogurts. The food is then called "fortified". CAUSES The most common causes of vitamin B12 deficiency are:  Pernicious anemia. The condition develops when the body cannot make enough healthy red blood cells. This stems from a lack of a protein made in the stomach (intrinsic factor). People without this protein cannot absorb enough vitamin B12 from food.  Malabsorption. This is when the body cannot absorb the vitamin. It can be caused by:  Pernicious anemia.  Surgery to remove part or all of the stomach can lead to malabsorption. Removal of part or all of the small intestine can also cause malabsorption.  Vegetarian diet.  People who are strict about not eating foods from animals could have trouble taking in enough vitamin B12 from diet alone.  Medications. Some medicines have been linked to B12 deficiency, such as Metformin (a drug prescribed for type 2 diabetes). Long-term use of stomach acid suppressants also can keep the vitamin from being absorbed.  Intestinal problems such as inflammatory bowel disease. If there are problems in the digestive tract, vitamin B12 may not be absorbed in good enough amounts. SYMPTOMS People who do not get enough B12 can develop problems. These can include:  Anemia. This is when the body has too few red blood cells. Red blood cells carry oxygen to the rest of the body. Without a healthy supply of red blood cells, people can feel:  Tired (fatigued).  Weak.  Severe  anemia can cause:  Shortness of breath.  Dizziness.  Rapid heart rate.  Paleness.  Other Vitamin B12 deficiency symptoms include:  Diarrhea.  Numbness or tingling in the hands or feet.  Loss of appetite.  Confusion.  Sores on the tongue or in the mouth. LET YOUR CAREGIVER KNOW ABOUT:  Any allergies. It is very important to know if you are allergic or sensitive to cobalt. Vitamin B12 contains cobalt.  Any history of kidney disease.  All medications you are taking. Include prescription and over-the-counter medicines, herbs and creams.  Whether you are pregnant or breast-feeding.  If you have Leber's disease, a hereditary eye condition, vitamin B12 could make it worse. RISKS AND COMPLICATIONS Reactions to an injection are usually temporary. They might include:  Pain at the injection site.  Redness, swelling or tenderness at the site.  Headache, dizziness or weakness.  Nausea, upset stomach or diarrhea.  Numbness or tingling.  Fever.  Joint pain.  Itching or rash. If a reaction does not go away in a short while, talk with your healthcare provider. A change in the way the shots are  given, or where they are given, might need to be made. BEFORE AN INJECTION To decide whether B12 injections are right for you, your healthcare provider will probably:  Ask about your medical history.  Ask questions about your diet.  Ask about symptoms such as:  Have you felt weak?  Do you feel unusually tired?  Do you get dizzy?  Order blood tests. These may include a test to:  Check the level of red cells in your blood.  Measure B12 levels.  Check for the presence of intrinsic factor. VITAMIN B12 INJECTIONS How often you will need a vitamin B12 injection will depend on how severe your deficiency is. This also will affect how long you will need to get them. People with pernicious anemia usually get injections for their entire life. Others might get them for a shorter period. For many people, injections are given daily or weekly for several weeks. Then, once B12 levels are normal, injections are given just once a month. If the cause of the deficiency can be fixed, the injections can be stopped. Talk with your healthcare provider about what you should expect. For an injection:  The injection site will be cleaned with an alcohol swab.  Your healthcare provider will insert a needle directly into a muscle. Most any muscle can be used. Most often, an arm muscle is used. A buttocks muscle can also be used. Many people say shots in that area are less painful.  A small adhesive bandage may be put over the injection site. It usually can be taken off in an hour or less. Injections can be given by your healthcare provider. In some cases, family members give them. Sometimes, people give them to themselves. Talk with your healthcare provider about what would be best for you. If someone other than your healthcare provider will be giving the shots, the person will need to be trained to give them correctly. HOME CARE INSTRUCTIONS   You can remove the adhesive bandage within an hour of getting a  shot.  You should be able to go about your normal activities right away.  Avoid drinking large amounts of alcohol while taking vitamin B12 shots. Alcohol can interfere with the body's use of the vitamin. SEEK MEDICAL CARE IF:   Pain, redness, swelling or tenderness at the injection site does not get better or gets worse.  Headache,  dizziness or weakness does not go away.  You develop a fever of more than 100.5 F (38.1 C). SEEK IMMEDIATE MEDICAL CARE IF:   You have chest pain.  You develop shortness of breath.  You have muscle weakness that gets worse.  You develop numbness, weakness or tingling on one side or one area of the body.  You have symptoms of an allergic reaction, such as:  Hives.  Difficulty breathing.  Swelling of the lips, face, tongue or throat.  You develop a fever of more than 102.0 F (38.9 C). MAKE SURE YOU:   Understand these instructions.  Will watch your condition.  Will get help right away if you are not doing well or get worse. Document Released: 12/27/2008 Document Revised: 12/23/2011 Document Reviewed: 12/27/2008 [facility] Patient Information 2014 [place], [place].  Take the Lomotil in addition to the Imodium as needed for your diarrhea Continue taking Tarceva 150 mg by mouth daily All up in one month with a restaging CT scan of your chest to reevaluate your disease

## 2014-01-18 NOTE — Progress Notes (Addendum)
[facility] [phone]   [phone]  SHARED VISIT PROGRESS NOTE  Terri Levels, [place] [phone] [place] [address]  DIAGNOSIS AND STAGE:  #1 Recurrent non-small cell lung cancer consistent with adenocarcinoma. This was initially diagnosed as stage IB (T2a N0 M0) well-differentiated adenocarcinoma of bronchioalveolar type in December 2010.  #2 Acute pulmonary embolism in the right lower lobe lobar, segmental and subsegmental sized pulmonary arteries, diagnosed on 11/04/2011.   PRIOR THERAPY:  1.Status post right upper lobectomy on September 28, 2009 under the care of Dr. Arlyce Dice. The patient refused adjuvant chemotherapy at that time. She had disease recurrence in June of 2012.  2.Status post 4 cycles of systemic chemotherapy with carboplatin for an AUC of 6 and paclitaxel at 200 mg per meter squared and Avastin at 15 mg/kg according to the ECOG protocol #5508.  3. Chemotherapy with Avastin 15 mg/kg according to the ECOG protocol #5508 status post 32 cycles. Last cycle was given on 06/30/2013 discontinued secondary to disease progression and persistent proteinuria.  CURRENT THERAPY: Tarceva 150 mg by mouth daily, started 08/09/2013, status post 5 months of treatment.  CHEMOTHERAPY INTENT: Palliative.  CURRENT # OF CHEMOTHERAPY CYCLES: 6 CURRENT ANTIEMETICS: Compazine  CURRENT SMOKING STATUS: Non-smoker  ORAL CHEMOTHERAPY AND CONSENT: Tarceva and consent was signed 07/21/2013.  CURRENT BISPHOSPHONATES USE: None  PAIN MANAGEMENT: No pain  NARCOTICS INDUCED CONSTIPATION: No constipation.  LIVING WILL AND CODE STATUS: Full code   INTERVAL HISTORY: [name] 73 y.o. female returns to the clinic today for followup visit. The patient is feeling fine today except for mild skin rash mainly on the face. Her skin is very dry. She's had increased growth of her eyebrows, eyelashes as well as a "mustache". She complains of feeling depressed  due to the episodes of diarrhea as well as hair loss. The episodes of diarrhea can be set in an embarrassing. She has had to "throw close and cleaned up the bathroom. The episodes of diarrhea are limiting her activities of daily living. Regarding hair loss she has brushed her comfort care and had significant amounts of hair come out in the cone or brush. Her primary care physician has changed her antidepressant from Celexa to Lexapro.. She has tried taking Imodium for the diarrhea with marginal results. She's had a maximum of 3 episodes of diarrhea in a given day that they can be significant episodes.  She denied having any significant nausea or vomiting. She denied having any chest pain but continues to have shortness of breath with exertion with no cough or hemoptysis. The patient denied having any fever or chills. She continues to be concerned about the area on her left breast. She will followup with the breast Center with her concerns should the area because more problematic.   MEDICAL HISTORY: Past Medical History  Diagnosis Date  . Lung cancer 03/08/2011    recurrent  . Hypertension   . Hypothyroidism   . Hypercholesterolemia   . Glaucoma   . Hip pain 09/02/2011  . Foot pain 09/02/2011    ALLERGIES:  is allergic to amitriptyline; codeine; and sulfa antibiotics.  MEDICATIONS:  Current Outpatient Prescriptions  Medication Sig Dispense Refill  . amLODipine (NORVASC) 5 MG tablet Take 10 mg by mouth daily. Per [name], NP      . clindamycin (CLEOCIN T) 1 % lotion Apply topically 2 (two) times daily. Apply twice daily as needed  120 mL  0  . clonazePAM (  KLONOPIN) 0.5 MG tablet Take 0.5 mg by mouth 2 (two) times daily as needed. Per [name], NP      . cyanocobalamin (,VITAMIN B-12,) 1000 MCG/ML injection Inject 1,000 mcg into the muscle every 30 (thirty) days.      . dorzolamide-timolol (COSOPT) 22.3-6.8 MG/ML ophthalmic solution 2 drops 2 (two) times daily. Per [name], NP        . escitalopram (LEXAPRO) 10 MG tablet       . levothyroxine (SYNTHROID, LEVOTHROID) 75 MCG tablet Take 88 mcg by mouth daily. Per [name], NP      . lidocaine-prilocaine (EMLA) cream Apply 1 application topically as needed.        Marland Kitchen lisinopril (PRINIVIL,ZESTRIL) 40 MG tablet Take 40 mg by mouth daily. Per [name], NP       . loperamide (IMODIUM) 2 MG capsule Take 2 mg by mouth as needed for diarrhea or loose stools.      . pravastatin (PRAVACHOL) 80 MG tablet       . TARCEVA 150 MG tablet TAKE 1 TABLET BY MOUTH    DAILY ON EMPTY STOMACH  30 tablet  1  . citalopram (CELEXA) 40 MG tablet Take 40 mg by mouth daily.        . diphenoxylate-atropine (LOMOTIL) 2.5-0.025 MG per tablet Take 1 to 2 tablets by mouth every 6 hours as needed for diarrhea  30 tablet  1  . fish oil-omega-3 fatty acids 1000 MG capsule Take 2 g by mouth daily.      . potassium chloride (K-DUR) 10 MEQ tablet Take 2 tablets (20 mEq total) by mouth 2 (two) times daily.  10 tablet  0  . prochlorperazine (COMPAZINE) 10 MG tablet Take 10 mg by mouth every 6 (six) hours as needed.        . Red Yeast Rice Extract (RED YEAST RICE PO) Take 1 capsule by mouth daily.       Current Facility-Administered Medications  Medication Dose Route Frequency Provider Last Rate Last Dose  . sodium chloride 0.9 % injection 10 mL  10 mL Intravenous PRN Carlton Adam, PA-C   10 mL at 01/18/14 1104    SURGICAL HISTORY:  Past Surgical History  Procedure Laterality Date  . Video assisted thoracoscopy  09/28/2009  . Thoracotomy  09/28/2009    mini  . Lung lobectomy  09/28/2009    right upper    REVIEW OF SYSTEMS:  Constitutional: positive for fatigue Eyes: negative Ears, nose, mouth, throat, and face: negative Respiratory: negative Cardiovascular: negative Gastrointestinal: negative Genitourinary:negative Integument/breast: Acne-like skin rash on the face especially the nose area. Hematologic/lymphatic:  negative Musculoskeletal:negative Neurological: positive for paresthesia Behavioral/Psych: negative Endocrine: negative Allergic/Immunologic: negative   PHYSICAL EXAMINATION: General appearance: alert, cooperative, fatigued and no distress Head: Normocephalic, without obvious abnormality, atraumatic Neck: no adenopathy, no JVD, supple, symmetrical, trachea midline and thyroid not enlarged, symmetric, no tenderness/mass/nodules Lymph nodes: Cervical, supraclavicular, and axillary nodes normal. Resp: clear to auscultation bilaterally Back: symmetric, no curvature. ROM normal. No CVA tenderness. Cardio: regular rate and rhythm, S1, S2 normal, no murmur, click, rub or gallop GI: soft, non-tender; bowel sounds normal; no masses,  no organomegaly Extremities: extremities normal, atraumatic, no cyanosis or edema Neurologic: Alert and oriented X 3, normal strength and tone. Normal symmetric reflexes. Normal coordination and gait Hair distribution along the eyebrows and eyelashes is increased as well as a noticeable "mustache". Patient wears a wig and has done so for as long as she  is followed here  ECOG PERFORMANCE STATUS: 1 - Symptomatic but completely ambulatory  Blood pressure 131/47, pulse 66, temperature 98.1 F (36.7 C), temperature source Oral, resp. rate 18, height 5\' 4"  (1.626 m), weight 134 lb 12.8 oz (61.145 kg), SpO2 100.00%.  LABORATORY DATA: Lab Results  Component Value Date   WBC 8.6 01/18/2014   HGB 11.5* 01/18/2014   HCT 36.1 01/18/2014   MCV 89.8 01/18/2014   PLT 183 01/18/2014      Chemistry      Component Value Date/Time   NA 143 01/18/2014 0955   NA 136 05/20/2012 1416   NA 142 03/08/2011 0754   K 3.6 01/18/2014 0955   K 4.4 05/20/2012 1416   K 4.2 03/08/2011 0754   CL 107 03/30/2013 1036   CL 99 05/20/2012 1416   CL 102 03/08/2011 0754   CO2 26 01/18/2014 0955   CO2 26 05/20/2012 1416   CO2 27 03/08/2011 0754   BUN 11.8 01/18/2014 0955   BUN 6 05/20/2012 1416   BUN 9 03/08/2011 0754    CREATININE 1.2* 01/18/2014 0955   CREATININE 1.11* 05/20/2012 1416   CREATININE 1.0 03/08/2011 0754      Component Value Date/Time   CALCIUM 9.7 01/18/2014 0955   CALCIUM 10.3 05/20/2012 1416   CALCIUM 9.5 03/08/2011 0754   ALKPHOS 94 01/18/2014 0955   ALKPHOS 71 05/20/2012 1416   ALKPHOS 59 03/08/2011 0754   AST 11 01/18/2014 0955   AST 9 05/20/2012 1416   AST 19 03/08/2011 0754   ALT 7 01/18/2014 0955   ALT <5 05/20/2012 1416   ALT 18 03/08/2011 0754   BILITOT 0.74 01/18/2014 0955   BILITOT 0.7 05/20/2012 1416   BILITOT 0.50 03/08/2011 0754     RADIOLOGY RESULTS: Ct Chest Wo Contrast  11/15/2013   CLINICAL DATA:  Followup lung cancer.  EXAM: CT CHEST WITHOUT CONTRAST  TECHNIQUE: Multidetector CT imaging of the chest was performed following the standard protocol without IV contrast.  COMPARISON:  07/20/2013  RECIST Protocol 1.1 Lesions:  1. Posterior aspect of the right lower lobe (image 40 of series 5),ground-glass attenuation nodule is stable measuring 2.1 cm.  2. Superior segment of the left lower lobe ( image 25 of series 5), multi-cystic and ground-glass attenuation nodule measures 5.5 cm, image 25/series 5. Previously this measured 5.3 cm. Next  FINDINGS: FINDINGS No pleural effusion identified. Postoperative changes in volume loss within the right upper lobe are again noted. Multifocal, bilateral sub solid lesions are identified. The index lesion in the superior segment of left lower lobe measures 5.5 cm, image 25/series 5. Previously 5.3 cm. The sub solid lesion within the medial aspect of the right lower lobe measures 3 cm, image 32/ series 5. Previously this measured 3.1 cm. Closer to the base, right lower lobe nodule measures 1 cm, compared with 1.3 cm previously, image 41/series 5. Within the posterior right lower lobe there is a sub solid lesion which measures 2.1 cm, image 40/series 5. Previously this measured the same. No new pulmonary lesions identified stable scarring in the left upper lobe. There is a  calcified granuloma identified in the left upper lobe.  The trachea appears patent and is midline. The heart size is normal. Calcified atherosclerotic disease affects the thoracic aorta. There is no aneurysm identified. There is also calcified atherosclerotic change involving the LAD and left circumflex coronary arteries. Calcified left hilar lymph nodes are identified. No mediastinal or hilar adenopathy identified.  No enlarged axillary or  supraclavicular lymph nodes. Multiple calcified granulomas are noted within the liver and spleen. No acute findings identified within the upper abdomen.  Mild multi level thoracic spondylosis identified. No aggressive lytic or sclerotic bone lesions.  IMPRESSION: 1. No acute cardiopulmonary abnormalities. 2. Target lesion 1 is stable when compared with the previous exam. The second target lesion within the superior segment of left lower lobe it is mildly increased in the interval. 3. There is a non target lesion within the right lower lobe which has decreased in size from previous exam. 4. No new pulmonary lesions identified. 5. Prior granulomatous disease 6. Atherosclerotic disease including multi vessel coronary artery calcifications.   Electronically Signed   By: Kerby Moors M.D.   On: 11/15/2013 14:13   ASSESSMENT AND PLAN: This is a very pleasant 73 years old [place] American female with recurrent non-small cell lung cancer most recently treated with maintenance Avastin status post 32 cycles. She is currently on treatment with single agent Tarceva 150 mg by mouth daily status post 5 months and tolerating her treatment fairly well except for mild skin rash and occasional diarrhea. Her last scan showed stable disease with mild increase in the size of the superior segment left lower lobe nodule and decrease in the size of other nodules. Patient was discussed with and also seen by Dr. Julien Nordmann. She will continue on the Tarceva at 150 mg by mouth daily. We will add Lomotil  for control of her diarrhea. Should she continue to have in tolerable diarrhea we'll consider decreasing her Tarceva to 100 mg by mouth daily. She will followup with Dr. Julien Nordmann in one month with a restaging CT scan of the chest without contrast to reevaluate her disease. Her Port-A-Cath was flushed today. She will continue to apply clindamycin 1% lotion to her skin rash twice daily as needed.  Patient was advised to followup with the breast Center regarding followup diagnostic mammogram and ultrasound of her left breast The patient was advised to call immediately if she has any concerning symptoms in the interval. The patient voices understanding of current disease status and treatment options and is in agreement with the current care plan.  All questions were answered. The patient knows to call the clinic with any problems, questions or concerns. We can certainly see the patient much sooner if necessary.  Carlton Adam, PA-C  Addendum:  Hematology/Oncology Attending:  I had a face to face encounter with the patient. I recommended her care plan. This is a very pleasant 73 years old [place] American female with recurrent non-small cell lung cancer, adenocarcinoma. She is currently on treatment with Tarceva 150 mg by mouth daily for the last 5 months. The patient is tolerating her treatment fairly well except for occasional episodes of diarrhea sometimes up to 2-3 times daily but none on the other days. She has a mild skin rash. I recommended for the patient to continue her current treatment was Tarceva. We will start her on Lomotil in addition to Imodium for the diarrhea. She would come back for followup visit in one month's for reevaluation. If she continues to have persistent area, I would consider reducing her dose of Tarceva 100 mg by mouth daily. She was advised to call immediately if she has any concerning symptoms in the interval.  Disclaimer: This note was dictated with voice recognition  software. Similar sounding words can inadvertently be transcribed and may not be corrected upon review. Curt Bears, MD 01/19/2014

## 2014-02-10 ENCOUNTER — Ambulatory Visit: Payer: Medicare Other

## 2014-02-11 ENCOUNTER — Ambulatory Visit (HOSPITAL_COMMUNITY)
Admission: RE | Admit: 2014-02-11 | Discharge: 2014-02-11 | Disposition: A | Discharge status: Home / Self Care | Payer: Medicare Other | Source: Ambulatory Visit | Attending: Physician Assistant | Admitting: Physician Assistant

## 2014-02-11 DIAGNOSIS — C3490 Malignant neoplasm of unspecified part of unspecified bronchus or lung: Secondary | ICD-10-CM | POA: Insufficient documentation

## 2014-02-14 ENCOUNTER — Other Ambulatory Visit: Payer: Self-pay | Admitting: Internal Medicine

## 2014-02-15 ENCOUNTER — Ambulatory Visit (HOSPITAL_BASED_OUTPATIENT_CLINIC_OR_DEPARTMENT_OTHER): Payer: Medicare Other

## 2014-02-15 ENCOUNTER — Other Ambulatory Visit: Payer: Self-pay | Admitting: Internal Medicine

## 2014-02-15 ENCOUNTER — Ambulatory Visit: Payer: Medicare Other | Admitting: Internal Medicine

## 2014-02-15 ENCOUNTER — Ambulatory Visit (HOSPITAL_BASED_OUTPATIENT_CLINIC_OR_DEPARTMENT_OTHER): Payer: Medicare Other | Admitting: Internal Medicine

## 2014-02-15 ENCOUNTER — Other Ambulatory Visit (HOSPITAL_BASED_OUTPATIENT_CLINIC_OR_DEPARTMENT_OTHER): Payer: Medicare Other

## 2014-02-15 ENCOUNTER — Encounter: Payer: Self-pay | Admitting: Internal Medicine

## 2014-02-15 ENCOUNTER — Telehealth: Payer: Self-pay | Admitting: Internal Medicine

## 2014-02-15 VITALS — BP 142/65 | HR 65 | Temp 97.2°F | Resp 17 | Ht 64.0 in | Wt 134.7 lb

## 2014-02-15 DIAGNOSIS — R21 Rash and other nonspecific skin eruption: Secondary | ICD-10-CM

## 2014-02-15 DIAGNOSIS — E538 Deficiency of other specified B group vitamins: Secondary | ICD-10-CM

## 2014-02-15 DIAGNOSIS — C3490 Malignant neoplasm of unspecified part of unspecified bronchus or lung: Secondary | ICD-10-CM

## 2014-02-15 DIAGNOSIS — R197 Diarrhea, unspecified: Secondary | ICD-10-CM

## 2014-02-15 DIAGNOSIS — Z86711 Personal history of pulmonary embolism: Secondary | ICD-10-CM

## 2014-02-15 LAB — COMPREHENSIVE METABOLIC PANEL (CC13)
ALK PHOS: 86 U/L (ref 40–150)
ALT: 8 U/L (ref 0–55)
AST: 12 U/L (ref 5–34)
Albumin: 3.6 g/dL (ref 3.5–5.0)
Anion Gap: 9 mEq/L (ref 3–11)
BILIRUBIN TOTAL: 0.77 mg/dL (ref 0.20–1.20)
BUN: 11.4 mg/dL (ref 7.0–26.0)
CO2: 27 mEq/L (ref 22–29)
Calcium: 10 mg/dL (ref 8.4–10.4)
Chloride: 107 mEq/L (ref 98–109)
Creatinine: 1.2 mg/dL — ABNORMAL HIGH (ref 0.6–1.1)
Glucose: 92 mg/dl (ref 70–140)
Potassium: 3.7 mEq/L (ref 3.5–5.1)
SODIUM: 143 meq/L (ref 136–145)
TOTAL PROTEIN: 6.8 g/dL (ref 6.4–8.3)

## 2014-02-15 LAB — CBC WITH DIFFERENTIAL/PLATELET
BASO%: 0.6 % (ref 0.0–2.0)
Basophils Absolute: 0.1 10*3/uL (ref 0.0–0.1)
EOS%: 1.8 % (ref 0.0–7.0)
Eosinophils Absolute: 0.2 10*3/uL (ref 0.0–0.5)
HCT: 37.2 % (ref 34.8–46.6)
HGB: 12.1 g/dL (ref 11.6–15.9)
LYMPH%: 16.6 % (ref 14.0–49.7)
MCH: 28.7 pg (ref 25.1–34.0)
MCHC: 32.5 g/dL (ref 31.5–36.0)
MCV: 88.4 fL (ref 79.5–101.0)
MONO#: 0.7 10*3/uL (ref 0.1–0.9)
MONO%: 7.5 % (ref 0.0–14.0)
NEUT#: 6.5 10*3/uL (ref 1.5–6.5)
NEUT%: 73.5 % (ref 38.4–76.8)
Platelets: 184 10*3/uL (ref 145–400)
RBC: 4.21 10*6/uL (ref 3.70–5.45)
RDW: 14.9 % — ABNORMAL HIGH (ref 11.2–14.5)
WBC: 8.9 10*3/uL (ref 3.9–10.3)
lymph#: 1.5 10*3/uL (ref 0.9–3.3)

## 2014-02-15 MED ORDER — CYANOCOBALAMIN 1000 MCG/ML IJ SOLN
1000.0000 ug | Freq: Once | INTRAMUSCULAR | Status: AC
  Administered 2014-02-15: 1000 ug via INTRAMUSCULAR

## 2014-02-15 NOTE — Telephone Encounter (Signed)
per pof to sch appt-printed pt copy of sch

## 2014-02-15 NOTE — Progress Notes (Signed)
[name]   [phone]  OFFICE PROGRESS NOTE  Terri Levels, NP [phone] [place] [address]  DIAGNOSIS AND STAGE:  #1 Recurrent non-small cell lung cancer consistent with adenocarcinoma. This was initially diagnosed as stage IB (T2a N0 M0) well-differentiated adenocarcinoma of bronchioalveolar type in December 2010.  #2 Acute pulmonary embolism in the right lower lobe lobar, segmental and subsegmental sized pulmonary arteries, diagnosed on 11/04/2011.   PRIOR THERAPY:  1.Status post right upper lobectomy on September 28, 2009 under the care of Dr. Arlyce Dice. The patient refused adjuvant chemotherapy at that time. She had disease recurrence in June of 2012.  2.Status post 4 cycles of systemic chemotherapy with carboplatin for an AUC of 6 and paclitaxel at 200 mg per meter squared and Avastin at 15 mg/kg according to the ECOG protocol #5508.  3. Chemotherapy with Avastin 15 mg/kg according to the ECOG protocol #5508 status post 32 cycles. Last cycle was given on 06/30/2013 discontinued secondary to disease progression and persistent proteinuria.  CURRENT THERAPY: Tarceva 150 mg by mouth daily, started 08/09/2013, status post 6 months of treatment.  CHEMOTHERAPY INTENT: Palliative.  CURRENT # OF CHEMOTHERAPY CYCLES: 7 CURRENT ANTIEMETICS: Compazine  CURRENT SMOKING STATUS: Non-smoker  ORAL CHEMOTHERAPY AND CONSENT: Tarceva and consent was signed 07/21/2013.  CURRENT BISPHOSPHONATES USE: None  PAIN MANAGEMENT: No pain  NARCOTICS INDUCED CONSTIPATION: No constipation.  LIVING WILL AND CODE STATUS: Full code   INTERVAL HISTORY: [name] 73 y.o. female returns to the clinic today for followup visit. The patient is feeling fine today except for mild skin rash mainly on the face. She also has intermittent episodes of diarrhea up to 3 times a week but usually resolved with Imodium. She denied having any significant nausea  or vomiting. She denied having any chest pain but continues to have shortness of breath with exertion with no cough or hemoptysis. The patient denied having any fever or chills. She was started on treatment with Tarceva 6 months ago and tolerating it fairly well except for mild skin rash and few episodes of diarrhea. She had repeat CT scan of the chest performed recently and she is here for evaluation and discussion of her scan results.  MEDICAL HISTORY: Past Medical History  Diagnosis Date  . Lung cancer 03/08/2011    recurrent  . Hypertension   . Hypothyroidism   . Hypercholesterolemia   . Glaucoma   . Hip pain 09/02/2011  . Foot pain 09/02/2011    ALLERGIES:  is allergic to amitriptyline; codeine; and sulfa antibiotics.  MEDICATIONS:  Current Outpatient Prescriptions  Medication Sig Dispense Refill  . amLODipine (NORVASC) 5 MG tablet Take 10 mg by mouth daily. Per [name], NP      . citalopram (CELEXA) 40 MG tablet Take 40 mg by mouth daily.        . clindamycin (CLEOCIN T) 1 % lotion Apply topically 2 (two) times daily. Apply twice daily as needed  120 mL  0  . clonazePAM (KLONOPIN) 0.5 MG tablet Take 0.5 mg by mouth 2 (two) times daily as needed. Per [name], NP      . cyanocobalamin (,VITAMIN B-12,) 1000 MCG/ML injection Inject 1,000 mcg into the muscle every 30 (thirty) days.      . diphenoxylate-atropine (LOMOTIL) 2.5-0.025 MG per tablet Take 1 to 2 tablets by mouth every 6 hours as needed for diarrhea  30 tablet  1  . dorzolamide-timolol (COSOPT) 22.3-6.8  MG/ML ophthalmic solution 2 drops 2 (two) times daily. Per [name], NP       . escitalopram (LEXAPRO) 10 MG tablet       . levothyroxine (SYNTHROID, LEVOTHROID) 75 MCG tablet Take 88 mcg by mouth daily. Per [name], NP      . lidocaine-prilocaine (EMLA) cream Apply 1 application topically as needed.        Marland Kitchen lisinopril (PRINIVIL,ZESTRIL) 40 MG tablet Take 40 mg by mouth daily. Per [name], NP         . loperamide (IMODIUM) 2 MG capsule Take 2 mg by mouth as needed for diarrhea or loose stools.      . potassium chloride (K-DUR) 10 MEQ tablet Take 2 tablets (20 mEq total) by mouth 2 (two) times daily.  10 tablet  0  . pravastatin (PRAVACHOL) 80 MG tablet       . prochlorperazine (COMPAZINE) 10 MG tablet Take 10 mg by mouth every 6 (six) hours as needed.        Marland Kitchen TARCEVA 150 MG tablet TAKE 1 TABLET BY MOUTH    DAILY ON EMPTY STOMACH  30 tablet  0   No current facility-administered medications for this visit.    SURGICAL HISTORY:  Past Surgical History  Procedure Laterality Date  . Video assisted thoracoscopy  09/28/2009  . Thoracotomy  09/28/2009    mini  . Lung lobectomy  09/28/2009    right upper    REVIEW OF SYSTEMS:  Constitutional: positive for fatigue Eyes: negative Ears, nose, mouth, throat, and face: negative Respiratory: negative Cardiovascular: negative Gastrointestinal: negative Genitourinary:negative Integument/breast: Acne-like skin rash on the face especially the nose area. Hematologic/lymphatic: negative Musculoskeletal:negative Neurological: positive for paresthesia Behavioral/Psych: negative Endocrine: negative Allergic/Immunologic: negative   PHYSICAL EXAMINATION: General appearance: alert, cooperative, fatigued and no distress Head: Normocephalic, without obvious abnormality, atraumatic Neck: no adenopathy, no JVD, supple, symmetrical, trachea midline and thyroid not enlarged, symmetric, no tenderness/mass/nodules Lymph nodes: Cervical, supraclavicular, and axillary nodes normal. Resp: clear to auscultation bilaterally Back: symmetric, no curvature. ROM normal. No CVA tenderness. Cardio: regular rate and rhythm, S1, S2 normal, no murmur, click, rub or gallop GI: soft, non-tender; bowel sounds normal; no masses,  no organomegaly Extremities: extremities normal, atraumatic, no cyanosis or edema Neurologic: Alert and oriented X 3, normal strength and  tone. Normal symmetric reflexes. Normal coordination and gait  ECOG PERFORMANCE STATUS: 1 - Symptomatic but completely ambulatory  Blood pressure 142/65, pulse 65, temperature 97.2 F (36.2 C), temperature source Oral, resp. rate 17, height 5\' 4"  (1.626 m), weight 134 lb 11.2 oz (61.1 kg), SpO2 99.00%.  LABORATORY DATA: Lab Results  Component Value Date   WBC 8.9 02/15/2014   HGB 12.1 02/15/2014   HCT 37.2 02/15/2014   MCV 88.4 02/15/2014   PLT 184 02/15/2014      Chemistry      Component Value Date/Time   NA 143 02/15/2014 0955   NA 136 05/20/2012 1416   NA 142 03/08/2011 0754   K 3.7 02/15/2014 0955   K 4.4 05/20/2012 1416   K 4.2 03/08/2011 0754   CL 107 03/30/2013 1036   CL 99 05/20/2012 1416   CL 102 03/08/2011 0754   CO2 27 02/15/2014 0955   CO2 26 05/20/2012 1416   CO2 27 03/08/2011 0754   BUN 11.4 02/15/2014 0955   BUN 6 05/20/2012 1416   BUN 9 03/08/2011 0754   CREATININE 1.2* 02/15/2014 0955   CREATININE 1.11* 05/20/2012 1416  CREATININE 1.0 03/08/2011 0754      Component Value Date/Time   CALCIUM 10.0 02/15/2014 0955   CALCIUM 10.3 05/20/2012 1416   CALCIUM 9.5 03/08/2011 0754   ALKPHOS 86 02/15/2014 0955   ALKPHOS 71 05/20/2012 1416   ALKPHOS 59 03/08/2011 0754   AST 12 02/15/2014 0955   AST 9 05/20/2012 1416   AST 19 03/08/2011 0754   ALT 8 02/15/2014 0955   ALT <5 05/20/2012 1416   ALT 18 03/08/2011 0754   BILITOT 0.77 02/15/2014 0955   BILITOT 0.7 05/20/2012 1416   BILITOT 0.50 03/08/2011 0754     RADIOLOGY RESULTS: Ct Chest Wo Contrast  02/11/2014   CLINICAL DATA:  Followup lung cancer. History of bronchoalveolar carcinoma.  EXAM: CT CHEST WITHOUT CONTRAST  TECHNIQUE: Multidetector CT imaging of the chest was performed following the standard protocol without IV contrast.  COMPARISON:  11/15/2013  FINDINGS: There is no pleural effusion. Again noted are postoperative changes and volume loss within the right upper lobe. Multifocal, bilateral sub solid lesions are again noted. Index lesion in the superior  segment of left lower lobe measures 5.4 cm, image 26/series 5. This is unchanged from previous exam. The sub solid lesion within the medial aspect of the right lower lobe measures 3.1 cm, image 33/ series 5. Previously 3 cm. Closer to the base there is a sub solid nodule measuring 1.1 cm, image 43/ series 5. Previously 1 cm. No new or enlarging lung lesions identified.  The heart size appears normal. There is no pericardial effusion. Calcified atherosclerotic disease involves the thoracic aorta as well as the LAD, left circumflex and RCA coronary arteries. Calcified right paratracheal and left hilar lymph nodes are identified compatible with prior granulomatous disease. There is a right chest wall port a catheter with tip in the cavoatrial junction.  No enlarged axillary or supraclavicular lymph nodes identified. Incidental imaging through the upper abdomen shows multiple liver and spleen granulomas. There are no acute findings or evidence of metastatic disease within the upper abdomen. Stable nonspecific low-attenuation structure within the dome of liver measuring 5 mm.  Review of the visualized bony structures is significant for mild spondylosis. There are no aggressive lytic or sclerotic bone lesions.  IMPRESSION: 1. No acute cardiopulmonary abnormalities. 2. No significant change in bilateral sub solid lesions within both lungs. 3. Prior granulomatous disease 4. Atherosclerotic disease including multi vessel coronary artery calcifications.   Electronically Signed   By: Kerby Moors M.D.   On: 02/11/2014 12:28   ASSESSMENT AND PLAN: This is a very pleasant 73 years old [place] American female with recurrent non-small cell lung cancer most recently treated with maintenance Avastin status post 32 cycles. She is currently on treatment with single agent Tarceva 150 mg by mouth daily status post 6 months and tolerating her treatment fairly well except for mild skin rash and occasional diarrhea. CT scan of the  chest performed recently showed stable disease. I discussed the scan results with the patient today. I recommended for her to continue her treatment with Tarceva 150 mg by mouth daily. I recommended for the patient to continue treatment with Imodium for the diarrhea on as needed basis. For the skin rash, she will continue to apply the clindamycin 1% lotion to the skin rash area twice a day. She would come back for followup visit in one month. The patient was advised to call immediately if she has any concerning symptoms in the interval. The patient voices understanding of current disease status  and treatment options and is in agreement with the current care plan.  All questions were answered. The patient knows to call the clinic with any problems, questions or concerns. We can certainly see the patient much sooner if necessary. I spent 15 minutes of face-to-face counseling with the patient today of the total visit time 25 minutes.  Disclaimer: This note was dictated with voice recognition software. Similar sounding words can inadvertently be transcribed and may not be corrected upon review.

## 2014-02-28 ENCOUNTER — Other Ambulatory Visit: Payer: Self-pay | Admitting: Family

## 2014-02-28 DIAGNOSIS — R55 Syncope and collapse: Secondary | ICD-10-CM

## 2014-03-03 ENCOUNTER — Other Ambulatory Visit: Payer: Medicare Other

## 2014-03-08 ENCOUNTER — Ambulatory Visit
Admission: RE | Admit: 2014-03-08 | Discharge: 2014-03-08 | Disposition: A | Discharge status: Home / Self Care | Payer: Medicare Other | Source: Ambulatory Visit | Attending: Family | Admitting: Family

## 2014-03-08 DIAGNOSIS — R55 Syncope and collapse: Secondary | ICD-10-CM

## 2014-03-15 ENCOUNTER — Other Ambulatory Visit: Payer: Self-pay | Admitting: Internal Medicine

## 2014-03-15 DIAGNOSIS — C3490 Malignant neoplasm of unspecified part of unspecified bronchus or lung: Secondary | ICD-10-CM

## 2014-03-18 ENCOUNTER — Other Ambulatory Visit: Payer: Medicare Other

## 2014-03-21 ENCOUNTER — Ambulatory Visit (HOSPITAL_BASED_OUTPATIENT_CLINIC_OR_DEPARTMENT_OTHER): Payer: Medicare Other

## 2014-03-21 ENCOUNTER — Ambulatory Visit (HOSPITAL_BASED_OUTPATIENT_CLINIC_OR_DEPARTMENT_OTHER): Payer: Medicare Other | Admitting: Internal Medicine

## 2014-03-21 ENCOUNTER — Telehealth: Payer: Self-pay | Admitting: Internal Medicine

## 2014-03-21 ENCOUNTER — Other Ambulatory Visit (HOSPITAL_BASED_OUTPATIENT_CLINIC_OR_DEPARTMENT_OTHER): Payer: Medicare Other

## 2014-03-21 ENCOUNTER — Other Ambulatory Visit: Payer: Medicare Other

## 2014-03-21 ENCOUNTER — Encounter: Payer: Self-pay | Admitting: Internal Medicine

## 2014-03-21 ENCOUNTER — Ambulatory Visit: Payer: Medicare Other

## 2014-03-21 VITALS — Ht 64.0 in | Wt 134.5 lb

## 2014-03-21 VITALS — BP 126/61 | HR 69 | Temp 97.9°F | Resp 16

## 2014-03-21 DIAGNOSIS — E538 Deficiency of other specified B group vitamins: Secondary | ICD-10-CM

## 2014-03-21 DIAGNOSIS — C3490 Malignant neoplasm of unspecified part of unspecified bronchus or lung: Secondary | ICD-10-CM

## 2014-03-21 DIAGNOSIS — C3410 Malignant neoplasm of upper lobe, unspecified bronchus or lung: Secondary | ICD-10-CM

## 2014-03-21 DIAGNOSIS — L270 Generalized skin eruption due to drugs and medicaments taken internally: Secondary | ICD-10-CM

## 2014-03-21 DIAGNOSIS — Z86711 Personal history of pulmonary embolism: Secondary | ICD-10-CM

## 2014-03-21 DIAGNOSIS — Z95828 Presence of other vascular implants and grafts: Secondary | ICD-10-CM

## 2014-03-21 LAB — CBC WITH DIFFERENTIAL/PLATELET
BASO%: 0.8 % (ref 0.0–2.0)
Basophils Absolute: 0.1 10*3/uL (ref 0.0–0.1)
EOS%: 2.5 % (ref 0.0–7.0)
Eosinophils Absolute: 0.2 10*3/uL (ref 0.0–0.5)
HEMATOCRIT: 35.6 % (ref 34.8–46.6)
HGB: 11.7 g/dL (ref 11.6–15.9)
LYMPH%: 19.6 % (ref 14.0–49.7)
MCH: 29 pg (ref 25.1–34.0)
MCHC: 32.8 g/dL (ref 31.5–36.0)
MCV: 88.3 fL (ref 79.5–101.0)
MONO#: 0.6 10*3/uL (ref 0.1–0.9)
MONO%: 8 % (ref 0.0–14.0)
NEUT#: 5.6 10*3/uL (ref 1.5–6.5)
NEUT%: 69.1 % (ref 38.4–76.8)
PLATELETS: 211 10*3/uL (ref 145–400)
RBC: 4.03 10*6/uL (ref 3.70–5.45)
RDW: 14.5 % (ref 11.2–14.5)
WBC: 8.1 10*3/uL (ref 3.9–10.3)
lymph#: 1.6 10*3/uL (ref 0.9–3.3)

## 2014-03-21 LAB — COMPREHENSIVE METABOLIC PANEL (CC13)
ALT: 10 U/L (ref 0–55)
AST: 15 U/L (ref 5–34)
Albumin: 3.5 g/dL (ref 3.5–5.0)
Alkaline Phosphatase: 89 U/L (ref 40–150)
Anion Gap: 12 mEq/L — ABNORMAL HIGH (ref 3–11)
BUN: 9.8 mg/dL (ref 7.0–26.0)
CO2: 22 meq/L (ref 22–29)
Calcium: 9.6 mg/dL (ref 8.4–10.4)
Chloride: 108 mEq/L (ref 98–109)
Creatinine: 1.2 mg/dL — ABNORMAL HIGH (ref 0.6–1.1)
Glucose: 97 mg/dl (ref 70–140)
Potassium: 3.9 mEq/L (ref 3.5–5.1)
Sodium: 142 mEq/L (ref 136–145)
Total Bilirubin: 0.44 mg/dL (ref 0.20–1.20)
Total Protein: 6.7 g/dL (ref 6.4–8.3)

## 2014-03-21 MED ORDER — CYANOCOBALAMIN 1000 MCG/ML IJ SOLN
1000.0000 ug | Freq: Once | INTRAMUSCULAR | Status: AC
  Administered 2014-03-21: 1000 ug via INTRAMUSCULAR

## 2014-03-21 MED ORDER — HEPARIN SOD (PORK) LOCK FLUSH 100 UNIT/ML IV SOLN
500.0000 [IU] | Freq: Once | INTRAVENOUS | Status: AC
  Administered 2014-03-21: 500 [IU] via INTRAVENOUS
  Filled 2014-03-21: qty 5

## 2014-03-21 MED ORDER — SODIUM CHLORIDE 0.9 % IJ SOLN
10.0000 mL | INTRAMUSCULAR | Status: DC | PRN
  Administered 2014-03-21: 10 mL via INTRAVENOUS
  Filled 2014-03-21: qty 10

## 2014-03-21 NOTE — Progress Notes (Signed)
Vitamin B12 injection done by flush nurse

## 2014-03-21 NOTE — Telephone Encounter (Signed)
gv adnrp itned appts ched and avs for pt fro July

## 2014-03-21 NOTE — Progress Notes (Signed)
[facility] [phone]   Fax:(336) [phone]  OFFICE PROGRESS NOTE  [name], NP [phone] [place] [address]  DIAGNOSIS AND STAGE:  #1 Recurrent non-small cell lung cancer consistent with adenocarcinoma. This was initially diagnosed as stage IB (T2a N0 M0) well-differentiated adenocarcinoma of bronchioalveolar type in December 2010.  #2 Acute pulmonary embolism in the right lower lobe lobar, segmental and subsegmental sized pulmonary arteries, diagnosed on 11/04/2011.   PRIOR THERAPY:  1.Status post right upper lobectomy on September 28, 2009 under the care of Dr. Arlyce Dice. The patient refused adjuvant chemotherapy at that time. She had disease recurrence in June of 2012.  2.Status post 4 cycles of systemic chemotherapy with carboplatin for an AUC of 6 and paclitaxel at 200 mg per meter squared and Avastin at 15 mg/kg according to the ECOG protocol #5508.  3. Chemotherapy with Avastin 15 mg/kg according to the ECOG protocol #5508 status post 32 cycles. Last cycle was given on 06/30/2013 discontinued secondary to disease progression and persistent proteinuria.  CURRENT THERAPY: Tarceva 150 mg by mouth daily, started 08/09/2013, status post 7 months of treatment.  CHEMOTHERAPY INTENT: Palliative.  CURRENT # OF CHEMOTHERAPY CYCLES: 8 CURRENT ANTIEMETICS: Compazine  CURRENT SMOKING STATUS: Non-smoker  ORAL CHEMOTHERAPY AND CONSENT: Tarceva and consent was signed 07/21/2013.  CURRENT BISPHOSPHONATES USE: None  PAIN MANAGEMENT: No pain  NARCOTICS INDUCED CONSTIPATION: No constipation.  LIVING WILL AND CODE STATUS: Full code   INTERVAL HISTORY: [name] 73 y.o. female returns to the clinic today for followup visit. The patient is feeling fine today except for mild skin rash mainly on the face as well as the growing eyelash. She has few episodes of diarrhea but improved with Imodium. She denied having any significant nausea or  vomiting. She denied having any chest pain but continues to have shortness of breath with exertion with no cough or hemoptysis. The patient denied having any fever or chills.   MEDICAL HISTORY: Past Medical History  Diagnosis Date  . Lung cancer 03/08/2011    recurrent  . Hypertension   . Hypothyroidism   . Hypercholesterolemia   . Glaucoma   . Hip pain 09/02/2011  . Foot pain 09/02/2011    ALLERGIES:  is allergic to amitriptyline; codeine; and sulfa antibiotics.  MEDICATIONS:  Current Outpatient Prescriptions  Medication Sig Dispense Refill  . amLODipine (NORVASC) 5 MG tablet Take 10 mg by mouth daily. Per [name], NP      . citalopram (CELEXA) 40 MG tablet Take 40 mg by mouth daily.        . clindamycin (CLEOCIN T) 1 % lotion Apply topically 2 (two) times daily. Apply twice daily as needed  120 mL  0  . clonazePAM (KLONOPIN) 0.5 MG tablet Take 0.5 mg by mouth 2 (two) times daily as needed. Per [name], NP      . cyanocobalamin (,VITAMIN B-12,) 1000 MCG/ML injection Inject 1,000 mcg into the muscle every 30 (thirty) days.      . diphenoxylate-atropine (LOMOTIL) 2.5-0.025 MG per tablet Take 1 to 2 tablets by mouth every 6 hours as needed for diarrhea  30 tablet  1  . dorzolamide-timolol (COSOPT) 22.3-6.8 MG/ML ophthalmic solution 2 drops 2 (two) times daily. Per [name], NP       . escitalopram (LEXAPRO) 10 MG tablet       . levothyroxine (SYNTHROID, LEVOTHROID) 75 MCG tablet Take 88 mcg by mouth daily. Per [name], NP      .  lidocaine-prilocaine (EMLA) cream Apply 1 application topically as needed.        Marland Kitchen lisinopril (PRINIVIL,ZESTRIL) 40 MG tablet Take 40 mg by mouth daily. Per [name], NP       . potassium chloride (K-DUR) 10 MEQ tablet Take 2 tablets (20 mEq total) by mouth 2 (two) times daily.  10 tablet  0  . pravastatin (PRAVACHOL) 80 MG tablet       . prochlorperazine (COMPAZINE) 10 MG tablet Take 10 mg by mouth every 6 (six) hours as needed.         Marland Kitchen TARCEVA 150 MG tablet TAKE 1 TABLET BY MOUTH    DAILY ON EMPTY STOMACH  30 tablet  0  . UNABLE TO FIND OTC antidiarrheal,  unsure of name, daily 1x daily       No current facility-administered medications for this visit.    SURGICAL HISTORY:  Past Surgical History  Procedure Laterality Date  . Video assisted thoracoscopy  09/28/2009  . Thoracotomy  09/28/2009    mini  . Lung lobectomy  09/28/2009    right upper    REVIEW OF SYSTEMS:  Constitutional: positive for fatigue Eyes: negative Ears, nose, mouth, throat, and face: negative Respiratory: negative Cardiovascular: negative Gastrointestinal: negative Genitourinary:negative Integument/breast: Acne-like skin rash on the face especially the nose area. Hematologic/lymphatic: negative Musculoskeletal:negative Neurological: positive for paresthesia Behavioral/Psych: negative Endocrine: negative Allergic/Immunologic: negative   PHYSICAL EXAMINATION: General appearance: alert, cooperative, fatigued and no distress Head: Normocephalic, without obvious abnormality, atraumatic Neck: no adenopathy, no JVD, supple, symmetrical, trachea midline and thyroid not enlarged, symmetric, no tenderness/mass/nodules Lymph nodes: Cervical, supraclavicular, and axillary nodes normal. Resp: clear to auscultation bilaterally Back: symmetric, no curvature. ROM normal. No CVA tenderness. Cardio: regular rate and rhythm, S1, S2 normal, no murmur, click, rub or gallop GI: soft, non-tender; bowel sounds normal; no masses,  no organomegaly Extremities: extremities normal, atraumatic, no cyanosis or edema Neurologic: Alert and oriented X 3, normal strength and tone. Normal symmetric reflexes. Normal coordination and gait  ECOG PERFORMANCE STATUS: 1 - Symptomatic but completely ambulatory  Height 5\' 4"  (1.626 m), weight 134 lb 8 oz (61.009 kg).  LABORATORY DATA: Lab Results  Component Value Date   WBC 8.1 03/21/2014   HGB 11.7 03/21/2014   HCT  35.6 03/21/2014   MCV 88.3 03/21/2014   PLT 211 03/21/2014      Chemistry      Component Value Date/Time   NA 143 02/15/2014 0955   NA 136 05/20/2012 1416   NA 142 03/08/2011 0754   K 3.7 02/15/2014 0955   K 4.4 05/20/2012 1416   K 4.2 03/08/2011 0754   CL 107 03/30/2013 1036   CL 99 05/20/2012 1416   CL 102 03/08/2011 0754   CO2 27 02/15/2014 0955   CO2 26 05/20/2012 1416   CO2 27 03/08/2011 0754   BUN 11.4 02/15/2014 0955   BUN 6 05/20/2012 1416   BUN 9 03/08/2011 0754   CREATININE 1.2* 02/15/2014 0955   CREATININE 1.11* 05/20/2012 1416   CREATININE 1.0 03/08/2011 0754      Component Value Date/Time   CALCIUM 10.0 02/15/2014 0955   CALCIUM 10.3 05/20/2012 1416   CALCIUM 9.5 03/08/2011 0754   ALKPHOS 86 02/15/2014 0955   ALKPHOS 71 05/20/2012 1416   ALKPHOS 59 03/08/2011 0754   AST 12 02/15/2014 0955   AST 9 05/20/2012 1416   AST 19 03/08/2011 0754   ALT 8 02/15/2014 0955   ALT <5 05/20/2012 1416  ALT 18 03/08/2011 0754   BILITOT 0.77 02/15/2014 0955   BILITOT 0.7 05/20/2012 1416   BILITOT 0.50 03/08/2011 0754     RADIOLOGY RESULTS:  ASSESSMENT AND PLAN: This is a very pleasant 73 years old African American female with recurrent non-small cell lung cancer most recently treated with maintenance Avastin status post 32 cycles. She is currently on treatment with single agent Tarceva 150 mg by mouth daily status post 7 months and tolerating her treatment fairly well except for mild skin rash and occasional diarrhea. I recommended for her to continue her treatment with Tarceva 150 mg by mouth daily. I recommended for the patient to continue treatment with Imodium for the diarrhea on as needed basis. For the skin rash, she will continue to apply the clindamycin 1% lotion to the skin rash area twice a day. She would come back for followup visit in one month. The patient was advised to call immediately if she has any concerning symptoms in the interval. The patient voices understanding of current disease status and treatment  options and is in agreement with the current care plan.  All questions were answered. The patient knows to call the clinic with any problems, questions or concerns. We can certainly see the patient much sooner if necessary.  Disclaimer: This note was dictated with voice recognition software. Similar sounding words can inadvertently be transcribed and may not be corrected upon review.

## 2014-03-21 NOTE — Patient Instructions (Addendum)
Implanted Port Home Guide An implanted port is a type of central line that is placed under the skin. Central lines are used to provide IV access when treatment or nutrition needs to be given through a person's veins. Implanted ports are used for long-term IV access. An implanted port may be placed because:   You need IV medicine that would be irritating to the small veins in your hands or arms.   You need long-term IV medicines, such as antibiotics.   You need IV nutrition for a long period.   You need frequent blood draws for lab tests.   You need dialysis.  Implanted ports are usually placed in the chest area, but they can also be placed in the upper arm, the abdomen, or the leg. An implanted port has two main parts:   Reservoir. The reservoir is round and will appear as a small, raised area under your skin. The reservoir is the part where a needle is inserted to give medicines or draw blood.   Catheter. The catheter is a thin, flexible tube that extends from the reservoir. The catheter is placed into a large vein. Medicine that is inserted into the reservoir goes into the catheter and then into the vein.  HOW WILL I CARE FOR MY INCISION SITE? Do not get the incision site wet. Bathe or shower as directed by your health care provider.  HOW IS MY PORT ACCESSED? Special steps must be taken to access the port:   Before the port is accessed, a numbing cream can be placed on the skin. This helps numb the skin over the port site.   Your health care provider uses a sterile technique to access the port.  Your health care provider must put on a mask and sterile gloves.  The skin over your port is cleaned carefully with an antiseptic and allowed to dry.  The port is gently pinched between sterile gloves, and a needle is inserted into the port.  Only "non-coring" port needles should be used to access the port. Once the port is accessed, a blood return should be checked. This helps  ensure that the port is in the vein and is not clogged.   If your port needs to remain accessed for a constant infusion, a clear (transparent) bandage will be placed over the needle site. The bandage and needle will need to be changed every week, or as directed by your health care provider.   Keep the bandage covering the needle clean and dry. Do not get it wet. Follow your health care provider's instructions on how to take a shower or bath while the port is accessed.   If your port does not need to stay accessed, no bandage is needed over the port.  WHAT IS FLUSHING? Flushing helps keep the port from getting clogged. Follow your health care provider's instructions on how and when to flush the port. Ports are usually flushed with saline solution or a medicine called heparin. The need for flushing will depend on how the port is used.   If the port is used for intermittent medicines or blood draws, the port will need to be flushed:   After medicines have been given.   After blood has been drawn.   As part of routine maintenance.   If a constant infusion is running, the port may not need to be flushed.  HOW LONG WILL MY PORT STAY IMPLANTED? The port can stay in for as long as your health care   provider thinks it is needed. When it is time for the port to come out, surgery will be done to remove it. The procedure is similar to the one performed when the port was put in.  WHEN SHOULD I SEEK IMMEDIATE MEDICAL CARE? When you have an implanted port, you should seek immediate medical care if:   You notice a bad smell coming from the incision site.   You have swelling, redness, or drainage at the incision site.   You have more swelling or pain at the port site or the surrounding area.   You have a fever that is not controlled with medicine. Document Released: 09/30/2005 Document Revised: 07/21/2013 Document Reviewed: 06/07/2013 [facility] Patient Information 2014 [place]. Cyanocobalamin, Vitamin B12 injection What is this medicine? CYANOCOBALAMIN (sye an oh koe BAL a min) is a man made form of vitamin B12. Vitamin B12 is used in the growth of healthy blood cells, nerve cells, and proteins in the body. It also helps with the metabolism of fats and carbohydrates. This medicine is used to treat people who can not absorb vitamin B12. This medicine may be used for other purposes; ask your health care provider or pharmacist if you have questions. COMMON BRAND NAME(S): Cyomin, LA-12 , Nutri-Twelve , Primabalt What should I tell my health care provider before I take this medicine? They need to know if you have any of these conditions: -kidney disease -Leber's disease -megaloblastic anemia -an unusual or allergic reaction to cyanocobalamin, cobalt, other medicines, foods, dyes, or preservatives -pregnant or trying to get pregnant -breast-feeding How should I use this medicine? This medicine is injected into a muscle or deeply under the skin. It is usually given by a health care professional in a clinic or doctor's office. However, your doctor may teach you how to inject yourself. Follow all instructions. Talk to your pediatrician regarding the use of this medicine in children. Special care may be needed. Overdosage: If you think you have taken too much of this medicine contact a poison control center or emergency room at once. NOTE: This medicine is only for you. Do not share this medicine with others. What if I miss a dose? If you are given your dose at a clinic or doctor's office, call to reschedule your appointment. If you give your own injections and you miss a dose, take it as soon as you can. If it is almost time for your next dose, take only that dose. Do not take double or extra doses. What may interact with this medicine? -colchicine -heavy alcohol intake This list may not describe all possible interactions. Give your health care provider a list of all  the medicines, herbs, non-prescription drugs, or dietary supplements you use. Also tell them if you smoke, drink alcohol, or use illegal drugs. Some items may interact with your medicine. What should I watch for while using this medicine? Visit your doctor or health care professional regularly. You may need blood work done while you are taking this medicine. You may need to follow a special diet. Talk to your doctor. Limit your alcohol intake and avoid smoking to get the best benefit. What side effects may I notice from receiving this medicine? Side effects that you should report to your doctor or health care professional as soon as possible: -allergic reactions like skin rash, itching or hives, swelling of the face, lips, or tongue -blue tint to skin -chest tightness, pain -difficulty breathing, wheezing -dizziness -red, swollen painful area on the leg Side  effects that usually do not require medical attention (report to your doctor or health care professional if they continue or are bothersome): -diarrhea -headache This list may not describe all possible side effects. Call your doctor for medical advice about side effects. You may report side effects to FDA at 1-800-FDA-1088. Where should I keep my medicine? Keep out of the reach of children. Store at room temperature between 15 and 30 degrees C (59 and 85 degrees F). Protect from light. Throw away any unused medicine after the expiration date. NOTE: This sheet is a summary. It may not cover all possible information. If you have questions about this medicine, talk to your doctor, pharmacist, or health care provider.  2014, [employer] Standard. (2008-01-11 22:10:20)

## 2014-04-14 ENCOUNTER — Other Ambulatory Visit: Payer: Self-pay | Admitting: *Deleted

## 2014-04-14 DIAGNOSIS — C3410 Malignant neoplasm of upper lobe, unspecified bronchus or lung: Secondary | ICD-10-CM

## 2014-04-14 MED ORDER — ERLOTINIB HCL 150 MG PO TABS: 150.0000 mg | ORAL_TABLET | Freq: Every day | ORAL | Status: DC

## 2014-04-20 ENCOUNTER — Ambulatory Visit: Payer: Medicare Other | Admitting: Internal Medicine

## 2014-04-20 ENCOUNTER — Other Ambulatory Visit: Payer: Medicare Other

## 2014-04-20 ENCOUNTER — Telehealth: Payer: Self-pay | Admitting: Internal Medicine

## 2014-04-20 ENCOUNTER — Ambulatory Visit: Payer: Medicare Other

## 2014-04-20 NOTE — Telephone Encounter (Signed)
pt called to r/s 7/8 appts. pt given new appt for 7/16.

## 2014-04-28 ENCOUNTER — Ambulatory Visit (HOSPITAL_BASED_OUTPATIENT_CLINIC_OR_DEPARTMENT_OTHER): Payer: Medicare Other | Admitting: Internal Medicine

## 2014-04-28 ENCOUNTER — Encounter: Payer: Self-pay | Admitting: Internal Medicine

## 2014-04-28 ENCOUNTER — Telehealth: Payer: Self-pay | Admitting: Internal Medicine

## 2014-04-28 ENCOUNTER — Ambulatory Visit: Payer: Medicare Other

## 2014-04-28 ENCOUNTER — Other Ambulatory Visit (HOSPITAL_BASED_OUTPATIENT_CLINIC_OR_DEPARTMENT_OTHER): Payer: Medicare Other

## 2014-04-28 ENCOUNTER — Ambulatory Visit (HOSPITAL_BASED_OUTPATIENT_CLINIC_OR_DEPARTMENT_OTHER): Payer: Medicare Other

## 2014-04-28 VITALS — BP 124/65 | HR 65 | Temp 97.9°F | Resp 17 | Ht 64.0 in | Wt 133.2 lb

## 2014-04-28 DIAGNOSIS — C3490 Malignant neoplasm of unspecified part of unspecified bronchus or lung: Secondary | ICD-10-CM

## 2014-04-28 DIAGNOSIS — C3410 Malignant neoplasm of upper lobe, unspecified bronchus or lung: Secondary | ICD-10-CM

## 2014-04-28 DIAGNOSIS — E538 Deficiency of other specified B group vitamins: Secondary | ICD-10-CM

## 2014-04-28 DIAGNOSIS — R0989 Other specified symptoms and signs involving the circulatory and respiratory systems: Secondary | ICD-10-CM

## 2014-04-28 DIAGNOSIS — R21 Rash and other nonspecific skin eruption: Secondary | ICD-10-CM

## 2014-04-28 DIAGNOSIS — R0609 Other forms of dyspnea: Secondary | ICD-10-CM

## 2014-04-28 DIAGNOSIS — R197 Diarrhea, unspecified: Secondary | ICD-10-CM

## 2014-04-28 LAB — CBC WITH DIFFERENTIAL/PLATELET
BASO%: 0.4 % (ref 0.0–2.0)
Basophils Absolute: 0 10*3/uL (ref 0.0–0.1)
EOS ABS: 0.2 10*3/uL (ref 0.0–0.5)
EOS%: 2.1 % (ref 0.0–7.0)
HCT: 33.9 % — ABNORMAL LOW (ref 34.8–46.6)
HGB: 11.1 g/dL — ABNORMAL LOW (ref 11.6–15.9)
LYMPH#: 1.7 10*3/uL (ref 0.9–3.3)
LYMPH%: 20.5 % (ref 14.0–49.7)
MCH: 28.6 pg (ref 25.1–34.0)
MCHC: 32.7 g/dL (ref 31.5–36.0)
MCV: 87.4 fL (ref 79.5–101.0)
MONO#: 0.7 10*3/uL (ref 0.1–0.9)
MONO%: 8.1 % (ref 0.0–14.0)
NEUT#: 5.5 10*3/uL (ref 1.5–6.5)
NEUT%: 68.9 % (ref 38.4–76.8)
Platelets: 166 10*3/uL (ref 145–400)
RBC: 3.88 10*6/uL (ref 3.70–5.45)
RDW: 15.2 % — ABNORMAL HIGH (ref 11.2–14.5)
WBC: 8 10*3/uL (ref 3.9–10.3)

## 2014-04-28 LAB — COMPREHENSIVE METABOLIC PANEL (CC13)
ALT: 12 U/L (ref 0–55)
ANION GAP: 8 meq/L (ref 3–11)
AST: 13 U/L (ref 5–34)
Albumin: 3.6 g/dL (ref 3.5–5.0)
Alkaline Phosphatase: 82 U/L (ref 40–150)
BUN: 14.1 mg/dL (ref 7.0–26.0)
CHLORIDE: 108 meq/L (ref 98–109)
CO2: 26 meq/L (ref 22–29)
CREATININE: 1.2 mg/dL — AB (ref 0.6–1.1)
Calcium: 9.7 mg/dL (ref 8.4–10.4)
GLUCOSE: 89 mg/dL (ref 70–140)
Potassium: 3.8 mEq/L (ref 3.5–5.1)
Sodium: 142 mEq/L (ref 136–145)
Total Bilirubin: 1.11 mg/dL (ref 0.20–1.20)
Total Protein: 6.7 g/dL (ref 6.4–8.3)

## 2014-04-28 MED ORDER — HEPARIN SOD (PORK) LOCK FLUSH 100 UNIT/ML IV SOLN
500.0000 [IU] | Freq: Once | INTRAVENOUS | Status: AC
  Administered 2014-04-28: 500 [IU] via INTRAVENOUS
  Filled 2014-04-28: qty 5

## 2014-04-28 MED ORDER — CYANOCOBALAMIN 1000 MCG/ML IJ SOLN
1000.0000 ug | Freq: Once | INTRAMUSCULAR | Status: AC
  Administered 2014-04-28: 1000 ug via INTRAMUSCULAR

## 2014-04-28 MED ORDER — SODIUM CHLORIDE 0.9 % IJ SOLN
10.0000 mL | INTRAMUSCULAR | Status: DC | PRN
  Administered 2014-04-28: 10 mL via INTRAVENOUS
  Filled 2014-04-28: qty 10

## 2014-04-28 NOTE — Telephone Encounter (Signed)
gva dn rpinted appt scehd adna vs for pt for Aug

## 2014-04-28 NOTE — Progress Notes (Signed)
[facility] [phone]   Fax:([phone]  OFFICE PROGRESS NOTE  [name], NP [id number] [place] [address]  DIAGNOSIS AND STAGE:  #1 Recurrent non-small cell lung cancer consistent with adenocarcinoma. This was initially diagnosed as stage IB (T2a N0 M0) well-differentiated adenocarcinoma of bronchioalveolar type in December 2010.  #2 Acute pulmonary embolism in the right lower lobe lobar, segmental and subsegmental sized pulmonary arteries, diagnosed on 11/04/2011.   PRIOR THERAPY:  1.Status post right upper lobectomy on September 28, 2009 under the care of Dr. Arlyce Dice. The patient refused adjuvant chemotherapy at that time. She had disease recurrence in June of 2012.  2.Status post 4 cycles of systemic chemotherapy with carboplatin for an AUC of 6 and paclitaxel at 200 mg per meter squared and Avastin at 15 mg/kg according to the ECOG protocol #5508.  3. Chemotherapy with Avastin 15 mg/kg according to the ECOG protocol #5508 status post 32 cycles. Last cycle was given on 06/30/2013 discontinued secondary to disease progression and persistent proteinuria.  CURRENT THERAPY: Tarceva 150 mg by mouth daily, started 08/09/2013, status post 8 months of treatment.  CHEMOTHERAPY INTENT: Palliative.  CURRENT # OF CHEMOTHERAPY CYCLES: 9 CURRENT ANTIEMETICS: Compazine  CURRENT SMOKING STATUS: Non-smoker  ORAL CHEMOTHERAPY AND CONSENT: Tarceva and consent was signed 07/21/2013.  CURRENT BISPHOSPHONATES USE: None  PAIN MANAGEMENT: No pain  NARCOTICS INDUCED CONSTIPATION: No constipation.  LIVING WILL AND CODE STATUS: Full code   INTERVAL HISTORY: [name] 73 y.o. female returns to the clinic today for followup visit. The patient is feeling fine today except for mild skin rash mainly on the face as well as the growing eyelash. She has few episodes of diarrhea but improved with Imodium. She denied having any significant nausea or  vomiting. She denied having any chest pain but continues to have shortness of breath with exertion with no cough or hemoptysis. The patient denied having any fever or chills.   MEDICAL HISTORY: Past Medical History  Diagnosis Date  . Lung cancer 03/08/2011    recurrent  . Hypertension   . Hypothyroidism   . Hypercholesterolemia   . Glaucoma   . Hip pain 09/02/2011  . Foot pain 09/02/2011    ALLERGIES:  is allergic to amitriptyline; codeine; and sulfa antibiotics.  MEDICATIONS:  Current Outpatient Prescriptions  Medication Sig Dispense Refill  . amLODipine (NORVASC) 5 MG tablet Take 10 mg by mouth daily. Per [name], NP      . clindamycin (CLEOCIN T) 1 % lotion Apply topically 2 (two) times daily. Apply twice daily as needed  120 mL  0  . clonazePAM (KLONOPIN) 0.5 MG tablet Take 0.5 mg by mouth 2 (two) times daily as needed. Per [name], NP      . cyanocobalamin (,VITAMIN B-12,) 1000 MCG/ML injection Inject 1,000 mcg into the muscle every 30 (thirty) days.      . diphenoxylate-atropine (LOMOTIL) 2.5-0.025 MG per tablet Take 1 to 2 tablets by mouth every 6 hours as needed for diarrhea  30 tablet  1  . dorzolamide-timolol (COSOPT) 22.3-6.8 MG/ML ophthalmic solution 2 drops 2 (two) times daily. Per [name], NP       . erlotinib (TARCEVA) 150 MG tablet Take 1 tablet (150 mg total) by mouth daily. Take on an empty stomach 1 hour before meals or 2 hours after.  30 tablet  1  . escitalopram (LEXAPRO) 10 MG tablet       . levothyroxine (  SYNTHROID, LEVOTHROID) 75 MCG tablet Take 88 mcg by mouth daily. Per [name], NP      . lidocaine-prilocaine (EMLA) cream Apply 1 application topically as needed.        Marland Kitchen lisinopril (PRINIVIL,ZESTRIL) 40 MG tablet Take 40 mg by mouth daily. Per [name], NP       . loperamide (IMODIUM) 2 MG capsule Take 2 mg by mouth as needed for diarrhea or loose stools. Take per package instructions      . potassium chloride (K-DUR) 10 MEQ tablet  Take 2 tablets (20 mEq total) by mouth 2 (two) times daily.  10 tablet  0  . pravastatin (PRAVACHOL) 80 MG tablet       . prochlorperazine (COMPAZINE) 10 MG tablet Take 10 mg by mouth every 6 (six) hours as needed.         No current facility-administered medications for this visit.    SURGICAL HISTORY:  Past Surgical History  Procedure Laterality Date  . Video assisted thoracoscopy  09/28/2009  . Thoracotomy  09/28/2009    mini  . Lung lobectomy  09/28/2009    right upper    REVIEW OF SYSTEMS:  Constitutional: positive for fatigue Eyes: negative Ears, nose, mouth, throat, and face: negative Respiratory: negative Cardiovascular: negative Gastrointestinal: negative Genitourinary:negative Integument/breast: Acne-like skin rash on the face especially the nose area. Hematologic/lymphatic: negative Musculoskeletal:negative Neurological: positive for paresthesia Behavioral/Psych: negative Endocrine: negative Allergic/Immunologic: negative   PHYSICAL EXAMINATION: General appearance: alert, cooperative, fatigued and no distress Head: Normocephalic, without obvious abnormality, atraumatic Neck: no adenopathy, no JVD, supple, symmetrical, trachea midline and thyroid not enlarged, symmetric, no tenderness/mass/nodules Lymph nodes: Cervical, supraclavicular, and axillary nodes normal. Resp: clear to auscultation bilaterally Back: symmetric, no curvature. ROM normal. No CVA tenderness. Cardio: regular rate and rhythm, S1, S2 normal, no murmur, click, rub or gallop GI: soft, non-tender; bowel sounds normal; no masses,  no organomegaly Extremities: extremities normal, atraumatic, no cyanosis or edema Neurologic: Alert and oriented X 3, normal strength and tone. Normal symmetric reflexes. Normal coordination and gait  ECOG PERFORMANCE STATUS: 1 - Symptomatic but completely ambulatory  Blood pressure 124/65, pulse 65, temperature 97.9 F (36.6 C), temperature source Oral, resp. rate 17,  height 5\' 4"  (1.626 m), weight 133 lb 3.2 oz (60.419 kg), SpO2 98.00%.  LABORATORY DATA: Lab Results  Component Value Date   WBC 8.0 04/28/2014   HGB 11.1* 04/28/2014   HCT 33.9* 04/28/2014   MCV 87.4 04/28/2014   PLT 166 04/28/2014      Chemistry      Component Value Date/Time   NA 142 04/28/2014 1052   NA 136 05/20/2012 1416   NA 142 03/08/2011 0754   K 3.8 04/28/2014 1052   K 4.4 05/20/2012 1416   K 4.2 03/08/2011 0754   CL 107 03/30/2013 1036   CL 99 05/20/2012 1416   CL 102 03/08/2011 0754   CO2 26 04/28/2014 1052   CO2 26 05/20/2012 1416   CO2 27 03/08/2011 0754   BUN 14.1 04/28/2014 1052   BUN 6 05/20/2012 1416   BUN 9 03/08/2011 0754   CREATININE 1.2* 04/28/2014 1052   CREATININE 1.11* 05/20/2012 1416   CREATININE 1.0 03/08/2011 0754      Component Value Date/Time   CALCIUM 9.7 04/28/2014 1052   CALCIUM 10.3 05/20/2012 1416   CALCIUM 9.5 03/08/2011 0754   ALKPHOS 82 04/28/2014 1052   ALKPHOS 71 05/20/2012 1416   ALKPHOS 59 03/08/2011 0754   AST 13 04/28/2014  1052   AST 9 05/20/2012 1416   AST 19 03/08/2011 0754   ALT 12 04/28/2014 1052   ALT <5 05/20/2012 1416   ALT 18 03/08/2011 0754   BILITOT 1.11 04/28/2014 1052   BILITOT 0.7 05/20/2012 1416   BILITOT 0.50 03/08/2011 0754     RADIOLOGY RESULTS:  ASSESSMENT AND PLAN: This is a very pleasant 73 years old African American female with recurrent non-small cell lung cancer most recently treated with maintenance Avastin status post 32 cycles. She is currently on treatment with single agent Tarceva 150 mg by mouth daily status post 8 months and tolerating her treatment fairly well except for mild skin rash and occasional diarrhea. I recommended for her to continue her treatment with Tarceva 150 mg by mouth daily. I recommended for her to take one Imodium daily in the morning to help with her unexpected diarrhea. For the skin rash, she will continue to apply the clindamycin 1% lotion to the skin rash area twice a day. She would come back for followup  visit in one month with repeat CT scan the chest for restaging of her disease. The patient was advised to call immediately if she has any concerning symptoms in the interval. The patient voices understanding of current disease status and treatment options and is in agreement with the current care plan.  All questions were answered. The patient knows to call the clinic with any problems, questions or concerns. We can certainly see the patient much sooner if necessary.  Disclaimer: This note was dictated with voice recognition software. Similar sounding words can inadvertently be transcribed and may not be corrected upon review.

## 2014-05-23 NOTE — Telephone Encounter (Signed)
Phone call - encounter closed. 

## 2014-05-24 ENCOUNTER — Encounter (HOSPITAL_COMMUNITY): Payer: Self-pay | Admitting: Emergency Medicine

## 2014-05-24 ENCOUNTER — Inpatient Hospital Stay (HOSPITAL_COMMUNITY)
Admission: EM | Admit: 2014-05-24 | Discharge: 2014-05-26 | DRG: 872 | Disposition: A | Discharge status: Home / Self Care | Payer: Medicare Other | Attending: Internal Medicine | Admitting: Internal Medicine

## 2014-05-24 ENCOUNTER — Emergency Department (HOSPITAL_COMMUNITY): Payer: Medicare Other

## 2014-05-24 DIAGNOSIS — A419 Sepsis, unspecified organism: Secondary | ICD-10-CM | POA: Diagnosis present

## 2014-05-24 DIAGNOSIS — IMO0002 Reserved for concepts with insufficient information to code with codable children: Secondary | ICD-10-CM | POA: Diagnosis not present

## 2014-05-24 DIAGNOSIS — R680 Hypothermia, not associated with low environmental temperature: Secondary | ICD-10-CM | POA: Diagnosis present

## 2014-05-24 DIAGNOSIS — C3490 Malignant neoplasm of unspecified part of unspecified bronchus or lung: Secondary | ICD-10-CM | POA: Diagnosis present

## 2014-05-24 DIAGNOSIS — R059 Cough, unspecified: Secondary | ICD-10-CM | POA: Diagnosis present

## 2014-05-24 DIAGNOSIS — T68XXXA Hypothermia, initial encounter: Secondary | ICD-10-CM

## 2014-05-24 DIAGNOSIS — E876 Hypokalemia: Secondary | ICD-10-CM | POA: Diagnosis present

## 2014-05-24 DIAGNOSIS — R1115 Cyclical vomiting syndrome unrelated to migraine: Secondary | ICD-10-CM

## 2014-05-24 DIAGNOSIS — E042 Nontoxic multinodular goiter: Secondary | ICD-10-CM

## 2014-05-24 DIAGNOSIS — R64 Cachexia: Secondary | ICD-10-CM | POA: Diagnosis present

## 2014-05-24 DIAGNOSIS — C3410 Malignant neoplasm of upper lobe, unspecified bronchus or lung: Secondary | ICD-10-CM

## 2014-05-24 DIAGNOSIS — A088 Other specified intestinal infections: Secondary | ICD-10-CM | POA: Diagnosis present

## 2014-05-24 DIAGNOSIS — Z79899 Other long term (current) drug therapy: Secondary | ICD-10-CM

## 2014-05-24 DIAGNOSIS — E890 Postprocedural hypothyroidism: Secondary | ICD-10-CM

## 2014-05-24 DIAGNOSIS — T451X5A Adverse effect of antineoplastic and immunosuppressive drugs, initial encounter: Secondary | ICD-10-CM | POA: Diagnosis present

## 2014-05-24 DIAGNOSIS — Z87891 Personal history of nicotine dependence: Secondary | ICD-10-CM

## 2014-05-24 DIAGNOSIS — D539 Nutritional anemia, unspecified: Secondary | ICD-10-CM

## 2014-05-24 DIAGNOSIS — R197 Diarrhea, unspecified: Secondary | ICD-10-CM | POA: Diagnosis present

## 2014-05-24 DIAGNOSIS — R200 Anesthesia of skin: Secondary | ICD-10-CM

## 2014-05-24 DIAGNOSIS — I10 Essential (primary) hypertension: Secondary | ICD-10-CM | POA: Diagnosis present

## 2014-05-24 DIAGNOSIS — E7800 Pure hypercholesterolemia, unspecified: Secondary | ICD-10-CM | POA: Diagnosis present

## 2014-05-24 DIAGNOSIS — R809 Proteinuria, unspecified: Secondary | ICD-10-CM

## 2014-05-24 DIAGNOSIS — H409 Unspecified glaucoma: Secondary | ICD-10-CM | POA: Diagnosis present

## 2014-05-24 DIAGNOSIS — I959 Hypotension, unspecified: Secondary | ICD-10-CM | POA: Diagnosis present

## 2014-05-24 DIAGNOSIS — R112 Nausea with vomiting, unspecified: Secondary | ICD-10-CM | POA: Diagnosis present

## 2014-05-24 DIAGNOSIS — R05 Cough: Secondary | ICD-10-CM | POA: Diagnosis present

## 2014-05-24 DIAGNOSIS — N179 Acute kidney failure, unspecified: Secondary | ICD-10-CM | POA: Diagnosis present

## 2014-05-24 DIAGNOSIS — Z902 Acquired absence of lung [part of]: Secondary | ICD-10-CM | POA: Diagnosis not present

## 2014-05-24 DIAGNOSIS — E538 Deficiency of other specified B group vitamins: Secondary | ICD-10-CM

## 2014-05-24 DIAGNOSIS — I2699 Other pulmonary embolism without acute cor pulmonale: Secondary | ICD-10-CM

## 2014-05-24 LAB — COMPREHENSIVE METABOLIC PANEL
ALBUMIN: 3.5 g/dL (ref 3.5–5.2)
ALT: 11 U/L (ref 0–35)
ANION GAP: 15 (ref 5–15)
AST: 14 U/L (ref 0–37)
Alkaline Phosphatase: 75 U/L (ref 39–117)
BILIRUBIN TOTAL: 0.4 mg/dL (ref 0.3–1.2)
BUN: 15 mg/dL (ref 6–23)
CHLORIDE: 102 meq/L (ref 96–112)
CO2: 22 mEq/L (ref 19–32)
Calcium: 9.6 mg/dL (ref 8.4–10.5)
Creatinine, Ser: 1.14 mg/dL — ABNORMAL HIGH (ref 0.50–1.10)
GFR calc non Af Amer: 46 mL/min — ABNORMAL LOW (ref >90)
GFR, EST AFRICAN AMERICAN: 54 mL/min — AB (ref >90)
GLUCOSE: 190 mg/dL — AB (ref 70–99)
Potassium: 3.5 mEq/L — ABNORMAL LOW (ref 3.7–5.3)
Sodium: 139 mEq/L (ref 137–147)
Total Protein: 6.7 g/dL (ref 6.0–8.3)

## 2014-05-24 LAB — FIBRINOGEN: Fibrinogen: 310 mg/dL (ref 204–475)

## 2014-05-24 LAB — CBC WITH DIFFERENTIAL/PLATELET
Basophils Absolute: 0 10*3/uL (ref 0.0–0.1)
Basophils Relative: 0 % (ref 0–1)
Eosinophils Absolute: 0 10*3/uL (ref 0.0–0.7)
Eosinophils Relative: 0 % (ref 0–5)
HEMATOCRIT: 31.4 % — AB (ref 36.0–46.0)
Hemoglobin: 10.7 g/dL — ABNORMAL LOW (ref 12.0–15.0)
Lymphocytes Relative: 8 % — ABNORMAL LOW (ref 12–46)
Lymphs Abs: 0.9 10*3/uL (ref 0.7–4.0)
MCH: 29.5 pg (ref 26.0–34.0)
MCHC: 34.1 g/dL (ref 30.0–36.0)
MCV: 86.5 fL (ref 78.0–100.0)
MONO ABS: 0.5 10*3/uL (ref 0.1–1.0)
MONOS PCT: 4 % (ref 3–12)
NEUTROS ABS: 10 10*3/uL — AB (ref 1.7–7.7)
Neutrophils Relative %: 88 % — ABNORMAL HIGH (ref 43–77)
Platelets: 172 10*3/uL (ref 150–400)
RBC: 3.63 MIL/uL — ABNORMAL LOW (ref 3.87–5.11)
RDW: 14.9 % (ref 11.5–15.5)
WBC: 11.5 10*3/uL — ABNORMAL HIGH (ref 4.0–10.5)

## 2014-05-24 LAB — APTT: aPTT: 25 seconds (ref 24–37)

## 2014-05-24 LAB — CORTISOL: CORTISOL PLASMA: 37.6 ug/dL

## 2014-05-24 LAB — URINALYSIS, ROUTINE W REFLEX MICROSCOPIC
BILIRUBIN URINE: NEGATIVE
Glucose, UA: 100 mg/dL — AB
Hgb urine dipstick: NEGATIVE
KETONES UR: NEGATIVE mg/dL
Leukocytes, UA: NEGATIVE
Nitrite: NEGATIVE
PROTEIN: NEGATIVE mg/dL
Specific Gravity, Urine: 1.019 (ref 1.005–1.030)
Urobilinogen, UA: 0.2 mg/dL (ref 0.0–1.0)
pH: 7 (ref 5.0–8.0)

## 2014-05-24 LAB — TYPE AND SCREEN
ABO/RH(D): A POS
Antibody Screen: NEGATIVE

## 2014-05-24 LAB — PROTIME-INR
INR: 1.03 (ref 0.00–1.49)
Prothrombin Time: 13.5 seconds (ref 11.6–15.2)

## 2014-05-24 LAB — ABO/RH: ABO/RH(D): A POS

## 2014-05-24 LAB — LACTIC ACID, PLASMA: Lactic Acid, Venous: 1.8 mmol/L (ref 0.5–2.2)

## 2014-05-24 LAB — PRO B NATRIURETIC PEPTIDE: Pro B Natriuretic peptide (BNP): 470.2 pg/mL — ABNORMAL HIGH (ref 0–125)

## 2014-05-24 LAB — TSH: TSH: 0.495 u[IU]/mL (ref 0.350–4.500)

## 2014-05-24 LAB — I-STAT CG4 LACTIC ACID, ED: Lactic Acid, Venous: 2.04 mmol/L (ref 0.5–2.2)

## 2014-05-24 LAB — LIPASE, BLOOD: Lipase: 38 U/L (ref 11–59)

## 2014-05-24 LAB — I-STAT TROPONIN, ED: Troponin i, poc: 0 ng/mL (ref 0.00–0.08)

## 2014-05-24 LAB — TROPONIN I: Troponin I: <0.3 ng/mL (ref <0.30)

## 2014-05-24 LAB — PROCALCITONIN: Procalcitonin: <0.1 ng/mL

## 2014-05-24 LAB — MRSA PCR SCREENING: MRSA BY PCR: NEGATIVE

## 2014-05-24 MED ORDER — PIPERACILLIN-TAZOBACTAM 3.375 G IVPB 30 MIN
3.3750 g | Freq: Once | INTRAVENOUS | Status: AC
  Administered 2014-05-24: 3.375 g via INTRAVENOUS
  Filled 2014-05-24: qty 50

## 2014-05-24 MED ORDER — ONDANSETRON HCL 4 MG/2ML IJ SOLN
4.0000 mg | Freq: Four times a day (QID) | INTRAMUSCULAR | Status: DC | PRN
  Administered 2014-05-24: 4 mg via INTRAVENOUS
  Filled 2014-05-24: qty 2

## 2014-05-24 MED ORDER — POTASSIUM CHLORIDE IN NACL 20-0.9 MEQ/L-% IV SOLN
INTRAVENOUS | Status: DC
  Administered 2014-05-24: 1000 mL via INTRAVENOUS
  Administered 2014-05-24 – 2014-05-25 (×2): via INTRAVENOUS
  Filled 2014-05-24 (×3): qty 1000

## 2014-05-24 MED ORDER — DORZOLAMIDE HCL-TIMOLOL MAL 2-0.5 % OP SOLN
2.0000 [drp] | Freq: Two times a day (BID) | OPHTHALMIC | Status: DC
Start: 2014-05-24 — End: 2014-05-26
  Administered 2014-05-24 – 2014-05-26 (×5): 2 [drp] via OPHTHALMIC
  Filled 2014-05-24: qty 10

## 2014-05-24 MED ORDER — LEVOTHYROXINE SODIUM 88 MCG PO TABS
88.0000 ug | ORAL_TABLET | Freq: Every day | ORAL | Status: DC
  Administered 2014-05-24 – 2014-05-26 (×3): 88 ug via ORAL
  Filled 2014-05-24 (×4): qty 1

## 2014-05-24 MED ORDER — LIDOCAINE-PRILOCAINE 2.5-2.5 % EX CREA
1.0000 "application " | TOPICAL_CREAM | CUTANEOUS | Status: DC | PRN
  Filled 2014-05-24: qty 5

## 2014-05-24 MED ORDER — ONDANSETRON HCL 4 MG/2ML IJ SOLN
4.0000 mg | Freq: Once | INTRAMUSCULAR | Status: AC
  Administered 2014-05-24: 4 mg via INTRAVENOUS
  Filled 2014-05-24: qty 2

## 2014-05-24 MED ORDER — ENOXAPARIN SODIUM 40 MG/0.4ML ~~LOC~~ SOLN
40.0000 mg | SUBCUTANEOUS | Status: DC
  Administered 2014-05-24 – 2014-05-26 (×3): 40 mg via SUBCUTANEOUS
  Filled 2014-05-24 (×3): qty 0.4

## 2014-05-24 MED ORDER — METRONIDAZOLE IN NACL 5-0.79 MG/ML-% IV SOLN
500.0000 mg | Freq: Once | INTRAVENOUS | Status: AC
  Administered 2014-05-24: 500 mg via INTRAVENOUS
  Filled 2014-05-24: qty 100

## 2014-05-24 MED ORDER — SIMVASTATIN 40 MG PO TABS
40.0000 mg | ORAL_TABLET | Freq: Every day | ORAL | Status: DC
  Administered 2014-05-24 – 2014-05-25 (×2): 40 mg via ORAL
  Filled 2014-05-24 (×3): qty 1

## 2014-05-24 MED ORDER — NICOTINE 14 MG/24HR TD PT24
14.0000 mg | MEDICATED_PATCH | Freq: Every day | TRANSDERMAL | Status: DC
  Filled 2014-05-24: qty 1

## 2014-05-24 MED ORDER — CLONAZEPAM 0.5 MG PO TABS
0.5000 mg | ORAL_TABLET | Freq: Once | ORAL | Status: AC
  Administered 2014-05-24: 0.5 mg via ORAL
  Filled 2014-05-24: qty 1

## 2014-05-24 MED ORDER — ONDANSETRON HCL 4 MG/2ML IJ SOLN
INTRAMUSCULAR | Status: AC
  Administered 2014-05-24: 4 mg
  Filled 2014-05-24: qty 2

## 2014-05-24 MED ORDER — SODIUM CHLORIDE 0.9 % IV SOLN: INTRAVENOUS | Status: DC

## 2014-05-24 MED ORDER — ONDANSETRON HCL 4 MG PO TABS: 4.0000 mg | ORAL_TABLET | Freq: Four times a day (QID) | ORAL | Status: DC | PRN

## 2014-05-24 MED ORDER — MECLIZINE HCL 25 MG PO TABS
25.0000 mg | ORAL_TABLET | Freq: Once | ORAL | Status: AC
  Administered 2014-05-24: 25 mg via ORAL
  Filled 2014-05-24: qty 1

## 2014-05-24 MED ORDER — LORAZEPAM 2 MG/ML IJ SOLN: 1.0000 mg | INTRAMUSCULAR | Status: DC | PRN

## 2014-05-24 MED ORDER — SODIUM CHLORIDE 0.9 % IV BOLUS (SEPSIS)
1000.0000 mL | INTRAVENOUS | Status: DC | PRN
  Administered 2014-05-24: 1000 mL via INTRAVENOUS

## 2014-05-24 MED ORDER — ERLOTINIB HCL 150 MG PO TABS: 150.0000 mg | ORAL_TABLET | Freq: Every day | ORAL | Status: DC

## 2014-05-24 MED ORDER — SODIUM CHLORIDE 0.9 % IJ SOLN
3.0000 mL | Freq: Two times a day (BID) | INTRAMUSCULAR | Status: DC
  Administered 2014-05-24 – 2014-05-25 (×4): 3 mL via INTRAVENOUS

## 2014-05-24 MED ORDER — METRONIDAZOLE IN NACL 5-0.79 MG/ML-% IV SOLN
500.0000 mg | Freq: Three times a day (TID) | INTRAVENOUS | Status: DC
Start: 2014-05-24 — End: 2014-05-26
  Administered 2014-05-24 – 2014-05-26 (×7): 500 mg via INTRAVENOUS
  Filled 2014-05-24 (×7): qty 100

## 2014-05-24 MED ORDER — ESCITALOPRAM OXALATE 10 MG PO TABS
10.0000 mg | ORAL_TABLET | Freq: Every day | ORAL | Status: DC
  Administered 2014-05-24 – 2014-05-26 (×3): 10 mg via ORAL
  Filled 2014-05-24 (×3): qty 1

## 2014-05-24 NOTE — H&P (Signed)
[name] is an 73 y.o. female.   Chief Complaint: Intractable nausea, vomiting, and diarrhea. 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 She has nausea, vomiting, and diarrha as a side effect of the medication. Ordinarily, Imodium is sufficient to control these symptoms, however beginning at 1800 on 8/10 she began having uncontrollable nausea, vomiting, and diarrhea. She has had chills, but no fever. She is very weak and cold.   Past Medical History  Diagnosis Date  . Lung cancer 03/08/2011    recurrent  . Hypertension   . Hypothyroidism   . Hypercholesterolemia   . Glaucoma   . Hip pain 09/02/2011  . Foot pain 09/02/2011    Past Surgical History  Procedure Laterality Date  . Video assisted thoracoscopy  09/28/2009  . Thoracotomy  09/28/2009    mini  . Lung lobectomy  09/28/2009    right upper    No family history on file. Social History:  reports that she quit smoking about 4 years ago. She does not have any smokeless tobacco history on file. Her alcohol and drug histories are not on file.  Allergies:  Allergies  Allergen Reactions  . Amitriptyline     Initial reaction about 25 years ago.  . Codeine Nausea And Vomiting    vomiting  . Sulfa Antibiotics Nausea And Vomiting    Medications Prior to Admission  Medication Sig Dispense Refill  . amLODipine (NORVASC) 5 MG tablet Take 10 mg by mouth daily. Per [name], NP      . clindamycin (CLEOCIN T) 1 % lotion Apply topically 2 (two) times daily. Apply twice daily as needed  120 mL  0  . clonazePAM (KLONOPIN) 0.5 MG tablet Take 0.5 mg by mouth 2 (two) times daily as needed. Per [name], NP      . cyanocobalamin (,VITAMIN B-12,) 1000 MCG/ML injection Inject 1,000 mcg into the muscle every 30 (thirty) days.      . diphenoxylate-atropine (LOMOTIL) 2.5-0.025 MG per tablet Take 1 to 2 tablets by mouth every 6 hours as needed for diarrhea  30 tablet  1  . dorzolamide-timolol  (COSOPT) 22.3-6.8 MG/ML ophthalmic solution 2 drops 2 (two) times daily. Per [name], NP       . erlotinib (TARCEVA) 150 MG tablet Take 1 tablet (150 mg total) by mouth daily. Take on an empty stomach 1 hour before meals or 2 hours after.  30 tablet  1  . escitalopram (LEXAPRO) 10 MG tablet Take 10 mg by mouth daily.       Marland Kitchen levothyroxine (SYNTHROID, LEVOTHROID) 88 MCG tablet Take 88 mcg by mouth daily before breakfast.      . lidocaine-prilocaine (EMLA) cream Apply 1 application topically as needed (prior to port access).       Marland Kitchen lisinopril (PRINIVIL,ZESTRIL) 40 MG tablet Take 40 mg by mouth daily. Per [name], NP       . loperamide (IMODIUM) 2 MG capsule Take 2 mg by mouth as needed for diarrhea or loose stools.       . potassium chloride (K-DUR) 10 MEQ tablet Take 2 tablets (20 mEq total) by mouth 2 (two) times daily.  10 tablet  0  . pravastatin (PRAVACHOL) 80 MG tablet Take 80 mg by mouth daily.       . prochlorperazine (COMPAZINE) 10 MG tablet Take 10 mg by mouth every 6 (six) hours as needed (for nausea).  Results for orders placed during the hospital encounter of 05/24/14 (from the past 48 hour(s))  URINALYSIS, ROUTINE W REFLEX MICROSCOPIC     Status: Abnormal   Collection Time    05/24/14  2:20 AM      Result Value Ref Range   Color, Urine YELLOW  YELLOW   APPearance CLEAR  CLEAR   Specific Gravity, Urine 1.019  1.005 - 1.030   pH 7.0  5.0 - 8.0   Glucose, UA 100 (*) NEGATIVE mg/dL   Hgb urine dipstick NEGATIVE  NEGATIVE   Bilirubin Urine NEGATIVE  NEGATIVE   Ketones, ur NEGATIVE  NEGATIVE mg/dL   Protein, ur NEGATIVE  NEGATIVE mg/dL   Urobilinogen, UA 0.2  0.0 - 1.0 mg/dL   Nitrite NEGATIVE  NEGATIVE   Leukocytes, UA NEGATIVE  NEGATIVE   Comment: MICROSCOPIC NOT DONE ON URINES WITH NEGATIVE PROTEIN, BLOOD, LEUKOCYTES, NITRITE, OR GLUCOSE <1000 mg/dL.  CBC WITH DIFFERENTIAL     Status: Abnormal   Collection Time    05/24/14  2:35 AM      Result Value Ref  Range   WBC 11.5 (*) 4.0 - 10.5 K/uL   RBC 3.63 (*) 3.87 - 5.11 MIL/uL   Hemoglobin 10.7 (*) 12.0 - 15.0 g/dL   HCT 31.4 (*) 36.0 - 46.0 %   MCV 86.5  78.0 - 100.0 fL   MCH 29.5  26.0 - 34.0 pg   MCHC 34.1  30.0 - 36.0 g/dL   RDW 14.9  11.5 - 15.5 %   Platelets 172  150 - 400 K/uL   Neutrophils Relative % 88 (*) 43 - 77 %   Neutro Abs 10.0 (*) 1.7 - 7.7 K/uL   Lymphocytes Relative 8 (*) 12 - 46 %   Lymphs Abs 0.9  0.7 - 4.0 K/uL   Monocytes Relative 4  3 - 12 %   Monocytes Absolute 0.5  0.1 - 1.0 K/uL   Eosinophils Relative 0  0 - 5 %   Eosinophils Absolute 0.0  0.0 - 0.7 K/uL   Basophils Relative 0  0 - 1 %   Basophils Absolute 0.0  0.0 - 0.1 K/uL  COMPREHENSIVE METABOLIC PANEL     Status: Abnormal   Collection Time    05/24/14  2:35 AM      Result Value Ref Range   Sodium 139  137 - 147 mEq/L   Potassium 3.5 (*) 3.7 - 5.3 mEq/L   Chloride 102  96 - 112 mEq/L   CO2 22  19 - 32 mEq/L   Glucose, Bld 190 (*) 70 - 99 mg/dL   BUN 15  6 - 23 mg/dL   Creatinine, Ser 1.14 (*) 0.50 - 1.10 mg/dL   Calcium 9.6  8.4 - 10.5 mg/dL   Total Protein 6.7  6.0 - 8.3 g/dL   Albumin 3.5  3.5 - 5.2 g/dL   AST 14  0 - 37 U/L   ALT 11  0 - 35 U/L   Alkaline Phosphatase 75  39 - 117 U/L   Total Bilirubin 0.4  0.3 - 1.2 mg/dL   GFR calc non Af Amer 46 (*) >90 mL/min   GFR calc Af Amer 54 (*) >90 mL/min   Comment: (NOTE)     The eGFR has been calculated using the CKD EPI equation.     This calculation has not been validated in all clinical situations.     eGFR's persistently <90 mL/min signify possible Chronic Kidney  Disease.   Anion gap 15  5 - 15  LIPASE, BLOOD     Status: None   Collection Time    05/24/14  2:35 AM      Result Value Ref Range   Lipase 38  11 - 59 U/L  LACTIC ACID, PLASMA     Status: None   Collection Time    05/24/14  2:36 AM      Result Value Ref Range   Lactic Acid, Venous 1.8  0.5 - 2.2 mmol/L  TROPONIN I     Status: None   Collection Time    05/24/14  2:36  AM      Result Value Ref Range   Troponin I <0.30  <0.30 ng/mL   Comment:            Due to the release kinetics of cTnI,     a negative result within the first hours     of the onset of symptoms does not rule out     myocardial infarction with certainty.     If myocardial infarction is still suspected,     repeat the test at appropriate intervals.  PROTIME-INR     Status: None   Collection Time    05/24/14  2:36 AM      Result Value Ref Range   Prothrombin Time 13.5  11.6 - 15.2 seconds   INR 1.03  0.00 - 1.49  APTT     Status: None   Collection Time    05/24/14  2:36 AM      Result Value Ref Range   aPTT 25  24 - 37 seconds  FIBRINOGEN     Status: None   Collection Time    05/24/14  2:36 AM      Result Value Ref Range   Fibrinogen 310  204 - 475 mg/dL  TYPE AND SCREEN     Status: None   Collection Time    05/24/14  2:36 AM      Result Value Ref Range   ABO/RH(D) A POS     Antibody Screen NEG     Sample Expiration 05/27/2014    ABO/RH     Status: None   Collection Time    05/24/14  2:36 AM      Result Value Ref Range   ABO/RH(D) A POS    I-STAT TROPOININ, ED     Status: None   Collection Time    05/24/14  2:51 AM      Result Value Ref Range   Troponin i, poc 0.00  0.00 - 0.08 ng/mL   Comment 3            Comment: Due to the release kinetics of cTnI,     a negative result within the first hours     of the onset of symptoms does not rule out     myocardial infarction with certainty.     If myocardial infarction is still suspected,     repeat the test at appropriate intervals.  I-STAT CG4 LACTIC ACID, ED     Status: None   Collection Time    05/24/14  2:54 AM      Result Value Ref Range   Lactic Acid, Venous 2.04  0.5 - 2.2 mmol/L   Dg Chest Portable 1 View  05/24/2014   CLINICAL DATA:  Vomiting and diarrhea.  Low-grade fever.  EXAM: PORTABLE CHEST - 1 VIEW  COMPARISON:  Chest radiograph performed 04/30/2011, and  CT of the chest performed 02/11/2014  FINDINGS:  The lungs are well-aerated. Vascular congestion is noted. Mild left perihilar airspace opacity may reflect atelectasis or possibly mild pneumonia, given the patient's symptoms. Interstitial edema might have a similar appearance. A left midlung calcified granuloma is unchanged in appearance. There is no evidence of pleural effusion or pneumothorax.  The cardiomediastinal silhouette is borderline normal in size. The right-sided chest port is noted ending about the cavoatrial junction. No acute osseous abnormalities are seen.  IMPRESSION: Vascular congestion noted. Mild left perihilar airspace opacity may reflect atelectasis or possibly mild pneumonia. Interstitial edema might have a similar appearance.   Electronically Signed   By: Garald Balding M.D.   On: 05/24/2014 02:58    Review of Systems  Unable to perform ROS: critical illness    Blood pressure 115/77, pulse 71, temperature 95.9 F (35.5 C), temperature source Rectal, resp. rate 28, height _0  (1.651 m), weight 58.968 kg (130 lb), SpO2 96.00%. Physical Exam  Constitutional: She appears distressed.  Pt appears cachectic and chronically ill.She is lethargic and very weak and unable to cooperate with history and/or physical.  HENT:  Head: Normocephalic and atraumatic.  Mouth/Throat: Oropharynx is clear and moist. No oropharyngeal exudate.  Eyes: Conjunctivae and EOM are normal. Pupils are equal, round, and reactive to light. Left eye exhibits no discharge. No scleral icterus.  Neck: Normal range of motion. Neck supple. No JVD present. No tracheal deviation present. No thyromegaly present.  Cardiovascular: Normal rate, regular rhythm, normal heart sounds and intact distal pulses.  Exam reveals no gallop and no friction rub.   No murmur heard. Respiratory: No respiratory distress. She has no wheezes. She has no rales. She exhibits no tenderness.  GI: Soft. Bowel sounds are normal. She exhibits distension. She exhibits no mass. There is  tenderness. There is no rebound and no guarding.  Musculoskeletal: She exhibits no edema and no tenderness.  Lymphadenopathy:    She has no cervical adenopathy.  Neurological:  Pt is lethargic and hypothermic. She is unable to cooperate with exam.  Skin: No rash noted. She is not diaphoretic. There is erythema. No pallor.  Pt is cool and is under a warming blanket.  Psychiatric:  Pt is lethargic and hypothermic. She is unable to cooperate with exam.     Assessment/Plan 1.Intractable nausea, vomiting, and diarrhea. Most likely due to the side effects of Tarceva. However, the patient is relatively immunosuppressed due to her cancer and the chemotherapy and is at risk for opportunistic infections like C diff colitis. She does have an elevated white blood cell count and she appears to be septic with hypothermia, hypotensive, and actually bradycardic. I will send stool for studies. In the meantime she will receive IV fluids and supplemental potassium. She will have a warming blanket. 2. Hyokalemia - supplement potassium 3. Sepsis with hypotension, hypothermia, bradycardia, and leukocytosis 4. Acute renal insufficiency - IV fluid supplementation. I have spent 64 minutes on the admission and care of this patient.  Reeshemah Nazaryan 05/24/2014, 5:47 AM

## 2014-05-24 NOTE — ED Notes (Signed)
Bair Hugger placed for rectal temp of 95.2 F

## 2014-05-24 NOTE — ED Notes (Signed)
Bed: MV78 Expected date:  Expected time:  Means of arrival:  Comments: EMS/73 yo female with N/V/D

## 2014-05-24 NOTE — Progress Notes (Signed)
Patient admitted after midnight. Chart reviewed. Patient examined. Temp and BP now normal. Still with dry heaves. No stool yet. Some new cough. CXR noted. Will check procalcitonin. Continue empiric flagyl. Await stool studies. Check TSH  Doree Barthel, MD [facility] [phone]

## 2014-05-24 NOTE — ED Notes (Signed)
Pt was assisted onto bedpan.

## 2014-05-24 NOTE — ED Provider Notes (Signed)
Pt presented to the ED with diarrhea, vomiting, chills and weakness.  On exam, hypothermic but normotensive. No focal abdominal ttp.  Lactic acid normal.  No hypotension in the ED.  Started on empiric abx for possible sepsis.  Admit for monitoring, IV abx.  Medical screening examination/treatment/procedure(s) were conducted as a shared visit with non-physician practitioner(s) and myself.  I personally evaluated the patient during the encounter.   EKG Interpretation   Date/Time:  Tuesday May 24 2014 02:12:19 EDT Ventricular Rate:  73 PR Interval:  280 QRS Duration: 89 QT Interval:  465 QTC Calculation: 512 R Axis:     Text Interpretation:  Sinus rhythm Prolonged PR interval Low voltage,  precordial leads Prolonged QT interval No significant change since last  tracing Confirmed by Janiene Aarons  MD-J, Claudeen Leason ([id number]) on 05/24/2014 2:42:20 AM        Dorie Rank, MD 05/24/14 [phone]

## 2014-05-24 NOTE — Progress Notes (Signed)
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 She has nausea, vomiting, and diarrha as a side effect of the medication. Ordinarily, Imodium is sufficient to control these symptoms, however beginning at 1800 on 8/10 she began     Action/Plan:   home when stable   Anticipated DC Date:  05/27/2014   Anticipated DC Plan:  HOME/SELF CARE  In-house referral  NA      DC Planning Services  NA      [facility] Choice  NA   Choice offered to / List presented to:  NA   DME arranged  NA      DME agency  NA     [place] arranged  NA      [place] agency  NA   Status of service:  In process, will continue to follow Medicare Important Message given?  NA - LOS <3 / Initial given by admissions (If response is "NO", the following Medicare IM given date fields will be blank) Date Medicare IM given:   Medicare IM given by:   Date Additional Medicare IM given:   Additional Medicare IM given by:    Discharge Disposition:    Per UR Regulation:  Reviewed for med. necessity/level of care/duration of stay  If discussed at [place] of Stay Meetings, dates discussed:    Comments:  Suanne Marker Davis,RN,BSN,CCM

## 2014-05-24 NOTE — ED Notes (Addendum)
Pt from home via [facility] c/o nausea, vomiting and diarrhea that started at 6pm on 8/10. She denies pain. Pt reports she is unable to count the number of episodes of vomiting and diarrhea. Hx of Lung CA. 18g left forearm. She received 4 mg Zofran and 500 NS Bolus en route. Pt alert and oriented x 4.

## 2014-05-24 NOTE — ED Notes (Signed)
Patient alert and answering questions about her medication with pharmacy tech.

## 2014-05-24 NOTE — ED Provider Notes (Signed)
CSN: [id number]     Arrival date & time 05/24/14  0203 History   First MD Initiated Contact with Patient 05/24/14 [phone]     Chief Complaint  Patient presents with  . Nausea  . Emesis  . Diarrhea     (Consider location/radiation/quality/duration/timing/severity/associated sxs/prior Treatment) HPI Comments: [name] is a 73 year old female with a history of lung cancer is currently taking Tarceva which has the side effect of diarrhea for which she take regular doses of Imodium.  States she was well until 6 PM yesterday when she started vomiting and had increased diarrhea, chills. She has take all her regular medications as of last PM.   Patient is a 73 y.o. female presenting with vomiting and diarrhea. The history is provided by the patient.  Emesis Severity:  Severe Duration:  1 day Timing:  Constant Quality:  Bilious material Progression:  Worsening Chronicity:  New Recent urination:  Decreased Worsened by:  Nothing tried Associated symptoms: chills and diarrhea   Associated symptoms: no abdominal pain, no fever, no headaches and no myalgias   Diarrhea:    Quality:  Watery   Severity:  Severe   Timing:  Intermittent   Progression:  Worsening Risk factors comment:  Chemotherapy  Diarrhea Associated symptoms: chills and vomiting   Associated symptoms: no abdominal pain, no fever, no headaches and no myalgias     Past Medical History  Diagnosis Date  . Lung cancer 03/08/2011    recurrent  . Hypertension   . Hypothyroidism   . Hypercholesterolemia   . Glaucoma   . Hip pain 09/02/2011  . Foot pain 09/02/2011   Past Surgical History  Procedure Laterality Date  . Video assisted thoracoscopy  09/28/2009  . Thoracotomy  09/28/2009    mini  . Lung lobectomy  09/28/2009    right upper   No family history on file. History  Substance Use Topics  . Smoking status: Former Smoker -- 1.00 packs/day for 50 years    Quit date: 09/28/2009  . Smokeless tobacco: Not on file   . Alcohol Use: Not on file   OB History   Grav Para Term Preterm Abortions TAB SAB Ect Mult Living                 Review of Systems  Constitutional: Positive for chills. Negative for fever and appetite change.  HENT: Negative for rhinorrhea.   Respiratory: Negative for shortness of breath.   Cardiovascular: Negative for chest pain.  Gastrointestinal: Positive for nausea, vomiting and diarrhea. Negative for abdominal pain.  Genitourinary: Negative for dysuria and frequency.  Musculoskeletal: Negative for myalgias.  Skin: Negative for rash.  Neurological: Negative for dizziness and headaches.      Allergies  Amitriptyline; Codeine; and Sulfa antibiotics  Home Medications   Prior to Admission medications   Medication Sig Start Date End Date Taking? Authorizing Provider  amLODipine (NORVASC) 5 MG tablet Take 10 mg by mouth daily. Per [name], NP   Yes Historical Provider, MD  clindamycin (CLEOCIN T) 1 % lotion Apply topically 2 (two) times daily. Apply twice daily as needed 08/26/13  Yes Curt Bears, MD  clonazePAM (KLONOPIN) 0.5 MG tablet Take 0.5 mg by mouth 2 (two) times daily as needed. Per [name], NP   Yes Historical Provider, MD  cyanocobalamin (,VITAMIN B-12,) 1000 MCG/ML injection Inject 1,000 mcg into the muscle every 30 (thirty) days.   Yes Historical Provider, MD  diphenoxylate-atropine (LOMOTIL) 2.5-0.025 MG per tablet Take 1 to 2  tablets by mouth every 6 hours as needed for diarrhea 01/18/14  Yes Adrena E Johnson, PA-C  dorzolamide-timolol (COSOPT) 22.3-6.8 MG/ML ophthalmic solution 2 drops 2 (two) times daily. Per [name], NP    Yes Historical Provider, MD  erlotinib (TARCEVA) 150 MG tablet Take 1 tablet (150 mg total) by mouth daily. Take on an empty stomach 1 hour before meals or 2 hours after. 04/14/14  Yes Curt Bears, MD  escitalopram (LEXAPRO) 10 MG tablet Take 10 mg by mouth daily.  12/29/13  Yes Historical Provider, MD  levothyroxine  (SYNTHROID, LEVOTHROID) 88 MCG tablet Take 88 mcg by mouth daily before breakfast.   Yes Historical Provider, MD  lidocaine-prilocaine (EMLA) cream Apply 1 application topically as needed (prior to port access).  05/06/11  Yes Historical Provider, MD  lisinopril (PRINIVIL,ZESTRIL) 40 MG tablet Take 40 mg by mouth daily. Per [name], NP    Yes Historical Provider, MD  loperamide (IMODIUM) 2 MG capsule Take 2 mg by mouth as needed for diarrhea or loose stools.    Yes Historical Provider, MD  potassium chloride (K-DUR) 10 MEQ tablet Take 2 tablets (20 mEq total) by mouth 2 (two) times daily. 06/01/13  Yes Curt Bears, MD  pravastatin (PRAVACHOL) 80 MG tablet Take 80 mg by mouth daily.  12/28/13  Yes Historical Provider, MD  prochlorperazine (COMPAZINE) 10 MG tablet Take 10 mg by mouth every 6 (six) hours as needed (for nausea).  04/18/11  Yes Historical Provider, MD   BP 105/49  Pulse 41  Temp(Src) 95.9 F (35.5 C) (Rectal)  Resp 19  Ht 5\' 5"  (1.651 m)  Wt 130 lb (58.968 kg)  BMI 21.63 kg/m2  SpO2 99% Physical Exam  Nursing note and vitals reviewed. Constitutional: She is oriented to person, place, and time. She appears well-developed and well-nourished. No distress.  HENT:  Mouth/Throat: Oropharynx is clear and moist.  Eyes: Pupils are equal, round, and reactive to light.  Neck: Normal range of motion.  Cardiovascular: Normal rate and regular rhythm.   Pulmonary/Chest: Effort normal and breath sounds normal. No respiratory distress. She has no wheezes.  Abdominal: Soft. She exhibits no distension. Bowel sounds are increased. There is no tenderness.  Musculoskeletal: Normal range of motion. She exhibits no tenderness.  Neurological: She is alert and oriented to person, place, and time.  Skin: Skin is warm and dry. There is pallor.    ED Course  Procedures (including critical care time) Labs Review Labs Reviewed  CBC WITH DIFFERENTIAL - Abnormal; Notable for the following:     WBC 11.5 (*)    RBC 3.63 (*)    Hemoglobin 10.7 (*)    HCT 31.4 (*)    Neutrophils Relative % 88 (*)    Neutro Abs 10.0 (*)    Lymphocytes Relative 8 (*)    All other components within normal limits  COMPREHENSIVE METABOLIC PANEL - Abnormal; Notable for the following:    Potassium 3.5 (*)    Glucose, Bld 190 (*)    Creatinine, Ser 1.14 (*)    GFR calc non Af Amer 46 (*)    GFR calc Af Amer 54 (*)    All other components within normal limits  URINALYSIS, ROUTINE W REFLEX MICROSCOPIC - Abnormal; Notable for the following:    Glucose, UA 100 (*)    All other components within normal limits  CULTURE, BLOOD (ROUTINE X 2)  CULTURE, BLOOD (ROUTINE X 2)  URINE CULTURE  STOOL CULTURE  LIPASE, BLOOD  LACTIC  ACID, PLASMA  TROPONIN I  PROTIME-INR  APTT  FIBRINOGEN  CORTISOL  GI PATHOGEN PANEL BY PCR, STOOL  I-STAT TROPOININ, ED  I-STAT CG4 LACTIC ACID, ED  TYPE AND SCREEN  ABO/RH    Imaging Review Dg Chest Portable 1 View  05/24/2014   CLINICAL DATA:  Vomiting and diarrhea.  Low-grade fever.  EXAM: PORTABLE CHEST - 1 VIEW  COMPARISON:  Chest radiograph performed 04/30/2011, and CT of the chest performed 02/11/2014  FINDINGS: The lungs are well-aerated. Vascular congestion is noted. Mild left perihilar airspace opacity may reflect atelectasis or possibly mild pneumonia, given the patient's symptoms. Interstitial edema might have a similar appearance. A left midlung calcified granuloma is unchanged in appearance. There is no evidence of pleural effusion or pneumothorax.  The cardiomediastinal silhouette is borderline normal in size. The right-sided chest port is noted ending about the cavoatrial junction. No acute osseous abnormalities are seen.  IMPRESSION: Vascular congestion noted. Mild left perihilar airspace opacity may reflect atelectasis or possibly mild pneumonia. Interstitial edema might have a similar appearance.   Electronically Signed   By: Garald Balding M.D.   On: 05/24/2014  02:58     EKG Interpretation   Date/Time:  Tuesday May 24 2014 02:12:19 EDT Ventricular Rate:  73 PR Interval:  280 QRS Duration: 89 QT Interval:  465 QTC Calculation: 512 R Axis:     Text Interpretation:  Sinus rhythm Prolonged PR interval Low voltage,  precordial leads Prolonged QT interval No significant change since last  tracing Confirmed by KNAPP  MD-J, JON ([id number]) on 05/24/2014 2:42:20 AM      MDM   Final diagnoses:  Diarrhea   patient states she is overall feeling slightly better but soon as his dizziness blood pressure is improved       Garald Balding, NP 05/24/14 [phone]

## 2014-05-25 ENCOUNTER — Inpatient Hospital Stay (HOSPITAL_COMMUNITY): Payer: Medicare Other

## 2014-05-25 LAB — COMPREHENSIVE METABOLIC PANEL
ALT: 9 U/L (ref 0–35)
AST: 17 U/L (ref 0–37)
Albumin: 2.9 g/dL — ABNORMAL LOW (ref 3.5–5.2)
Alkaline Phosphatase: 67 U/L (ref 39–117)
Anion gap: 10 (ref 5–15)
BUN: 5 mg/dL — AB (ref 6–23)
CO2: 25 mEq/L (ref 19–32)
Calcium: 9.2 mg/dL (ref 8.4–10.5)
Chloride: 109 mEq/L (ref 96–112)
Creatinine, Ser: 1.15 mg/dL — ABNORMAL HIGH (ref 0.50–1.10)
GFR calc Af Amer: 53 mL/min — ABNORMAL LOW (ref >90)
GFR calc non Af Amer: 46 mL/min — ABNORMAL LOW (ref >90)
GLUCOSE: 92 mg/dL (ref 70–99)
Potassium: 3.7 mEq/L (ref 3.7–5.3)
Sodium: 144 mEq/L (ref 137–147)
Total Bilirubin: 0.4 mg/dL (ref 0.3–1.2)
Total Protein: 5.7 g/dL — ABNORMAL LOW (ref 6.0–8.3)

## 2014-05-25 LAB — CBC
HCT: 31.1 % — ABNORMAL LOW (ref 36.0–46.0)
HEMOGLOBIN: 10.2 g/dL — AB (ref 12.0–15.0)
MCH: 28.8 pg (ref 26.0–34.0)
MCHC: 32.8 g/dL (ref 30.0–36.0)
MCV: 87.9 fL (ref 78.0–100.0)
Platelets: 179 10*3/uL (ref 150–400)
RBC: 3.54 MIL/uL — AB (ref 3.87–5.11)
RDW: 15.1 % (ref 11.5–15.5)
WBC: 10.2 10*3/uL (ref 4.0–10.5)

## 2014-05-25 LAB — URINE CULTURE
COLONY COUNT: NO GROWTH
Culture: NO GROWTH

## 2014-05-25 LAB — PROTIME-INR
INR: 1.15 (ref 0.00–1.49)
Prothrombin Time: 14.7 seconds (ref 11.6–15.2)

## 2014-05-25 LAB — EXPECTORATED SPUTUM ASSESSMENT W REFEX TO RESP CULTURE

## 2014-05-25 LAB — CLOSTRIDIUM DIFFICILE BY PCR: Toxigenic C. Difficile by PCR: NEGATIVE

## 2014-05-25 LAB — EXPECTORATED SPUTUM ASSESSMENT W GRAM STAIN, RFLX TO RESP C

## 2014-05-25 MED ORDER — BOOST / RESOURCE BREEZE PO LIQD
1.0000 | Freq: Two times a day (BID) | ORAL | Status: DC
  Administered 2014-05-25: 1 via ORAL
  Administered 2014-05-26: 11:00:00 via ORAL

## 2014-05-25 MED ORDER — DOXYCYCLINE HYCLATE 100 MG PO TABS
100.0000 mg | ORAL_TABLET | Freq: Two times a day (BID) | ORAL | Status: DC
  Administered 2014-05-25 – 2014-05-26 (×3): 100 mg via ORAL
  Filled 2014-05-25 (×4): qty 1

## 2014-05-25 MED ORDER — SODIUM CHLORIDE 0.9 % IJ SOLN: 10.0000 mL | INTRAMUSCULAR | Status: DC | PRN

## 2014-05-25 MED ORDER — POTASSIUM CHLORIDE IN NACL 20-0.9 MEQ/L-% IV SOLN
INTRAVENOUS | Status: AC
  Filled 2014-05-25 (×2): qty 1000

## 2014-05-25 MED ORDER — CLONAZEPAM 0.5 MG PO TABS
0.5000 mg | ORAL_TABLET | Freq: Every evening | ORAL | Status: DC | PRN
  Administered 2014-05-25: 0.5 mg via ORAL
  Filled 2014-05-25: qty 1

## 2014-05-25 MED ORDER — LOPERAMIDE HCL 2 MG PO CAPS
2.0000 mg | ORAL_CAPSULE | Freq: Once | ORAL | Status: AC
  Administered 2014-05-25: 2 mg via ORAL
  Filled 2014-05-25: qty 1

## 2014-05-25 MED ORDER — CHLORHEXIDINE GLUCONATE 0.12 % MT SOLN
15.0000 mL | Freq: Two times a day (BID) | OROMUCOSAL | Status: DC
  Administered 2014-05-25 – 2014-05-26 (×3): 15 mL via OROMUCOSAL
  Filled 2014-05-25 (×5): qty 15

## 2014-05-25 MED ORDER — LOPERAMIDE HCL 2 MG PO CAPS
2.0000 mg | ORAL_CAPSULE | Freq: Four times a day (QID) | ORAL | Status: DC | PRN
  Administered 2014-05-26: 2 mg via ORAL
  Filled 2014-05-25: qty 1

## 2014-05-25 MED ORDER — SODIUM CHLORIDE 0.9 % IJ SOLN
10.0000 mL | Freq: Two times a day (BID) | INTRAMUSCULAR | Status: DC
Start: 2014-05-25 — End: 2014-05-26
  Administered 2014-05-25: 10 mL

## 2014-05-25 MED ORDER — CETYLPYRIDINIUM CHLORIDE 0.05 % MT LIQD
7.0000 mL | Freq: Two times a day (BID) | OROMUCOSAL | Status: DC
  Administered 2014-05-25: 7 mL via OROMUCOSAL

## 2014-05-25 NOTE — Progress Notes (Signed)
Patient Demographics  [name], is a 73 y.o. female, DOB - 03-25-1941, WIO:[id number]  Admit date - 05/24/2014   Admitting Physician Karie Kirks, DO  Outpatient Primary MD for the patient is [name], NP  LOS - 1   Chief Complaint  Patient presents with  . Nausea  . Emesis  . Diarrhea        Subjective:   [name] today has, No headache, No chest pain, No abdominal pain - No Nausea, No new weakness tingling or numbness, mild cough but no shortness of breath, mild diarrhea.  Assessment & Plan    1. 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 Abdominal x-ray nonacute and nonspecific, stool C. difficile PCR pending, if negative will start her on Imodium, for now continue Flagyl. Overall much improved. If symptoms get worse we'll hold Tarceva and have her follow with her oncologist.   2. Productive cough. Question of pneumonia on chest x-ray and CT scan likely community-acquired. Sputum Gram stain culture, for now or doxycycline twice a day. Supportive care.   3. Hypokalemia replaced in stable.   4. Sepsis and hypotension due to #1 above. Resolved. Gentle IV fluids to continue.   5. Acute renal failure due to #1 above resolved with IV fluids.   6. History of lung cancer. Follows with Dr. Julien Nordmann outpatient continue post discharge. On Tarceva.     Code Status: Full  Family Communication: None present  Disposition Plan: Home   Procedures CT scan abdomen pelvis   Consults     Medications  Scheduled Meds: . antiseptic oral rinse  7 mL Mouth Rinse BID  . chlorhexidine  15 mL Mouth Rinse BID  . dorzolamide-timolol  2 drop Both Eyes BID   . doxycycline  100 mg Oral Q12H  . enoxaparin (LOVENOX) injection  40 mg Subcutaneous Q24H  . escitalopram  10 mg Oral Daily  . levothyroxine  88 mcg Oral QAC breakfast  . metronidazole  500 mg Intravenous Q8H  . nicotine  14 mg Transdermal Daily  . simvastatin  40 mg Oral q1800  . sodium chloride  10-40 mL Intracatheter Q12H  . sodium chloride  3 mL Intravenous Q12H   Continuous Infusions: . 0.9 % NaCl with KCl 20 mEq / L 100 mL/hr at 05/25/14 0824   PRN Meds:.lidocaine-prilocaine, LORazepam, ondansetron (ZOFRAN) IV, ondansetron, sodium chloride, sodium chloride  DVT Prophylaxis  Lovenox    Lab Results  Component Value Date   PLT 179 05/25/2014    Antibiotics     Anti-infectives   Start     Dose/Rate Route Frequency Ordered Stop   05/25/14 1130  doxycycline (VIBRA-TABS) tablet 100 mg     100 mg Oral Every 12 hours 05/25/14 1122     05/24/14 1200  metroNIDAZOLE (FLAGYL) IVPB 500 mg     500 mg 100 mL/hr over 60 Minutes Intravenous Every 8 hours 05/24/14 0858     05/24/14 0245  piperacillin-tazobactam (ZOSYN) IVPB 3.375 g     3.375 g 100 mL/hr over 30 Minutes Intravenous  Once 05/24/14 0232 05/24/14 0325   05/24/14 0245  metroNIDAZOLE (FLAGYL) IVPB 500  mg     500 mg 100 mL/hr over 60 Minutes Intravenous  Once 05/24/14 0232 05/24/14 0441          Objective:   Filed Vitals:   05/25/14 0400 05/25/14 0500 05/25/14 0600 05/25/14 0800  BP: 111/41   118/85  Pulse:  40 62 89  Temp: 99.5 F (37.5 C)   99.3 F (37.4 C)  TempSrc: Oral   Oral  Resp: 23 21 20 16   Height:      Weight:      SpO2: 90% 95% 100% 97%    Wt Readings from Last 3 Encounters:  05/25/14 62 kg (136 lb 11 oz)  04/28/14 60.419 kg (133 lb 3.2 oz)  03/21/14 61.009 kg (134 lb 8 oz)     Intake/Output Summary (Last 24 hours) at 05/25/14 1123 Last data filed at 05/25/14 1000  Gross per 24 hour  Intake   3115 ml  Output    925 ml  Net   2190 ml     Physical Exam  Awake Alert, Oriented X 3,  No new F.N deficits, Normal affect [place].AT,PERRAL Supple Neck,No JVD, No cervical lymphadenopathy appriciated.  Symmetrical Chest wall movement, Good air movement bilaterally, few rales RRR,No Gallops,Rubs or new Murmurs, No Parasternal Heave +ve B.Sounds, Abd Soft, No tenderness, No organomegaly appriciated, No rebound - guarding or rigidity. No Cyanosis, Clubbing or edema, No new Rash or bruise      Data Review   Micro Results Recent Results (from the past 240 hour(s))  CULTURE, BLOOD (ROUTINE X 2)     Status: None   Collection Time    05/24/14  2:35 AM      Result Value Ref Range Status   Specimen Description BLOOD RIGHT ANTECUBITAL   Final   Special Requests BOTTLES DRAWN AEROBIC AND ANAEROBIC 3CC   Final   Culture  Setup Time     Final   Value: 05/24/2014 08:31     Performed at [facility]   Culture     Final   Value:        BLOOD CULTURE RECEIVED NO GROWTH TO DATE CULTURE WILL BE HELD FOR 5 DAYS BEFORE ISSUING A FINAL NEGATIVE REPORT     Performed at [facility]   Report Status PENDING   Incomplete  CULTURE, BLOOD (ROUTINE X 2)     Status: None   Collection Time    05/24/14  2:36 AM      Result Value Ref Range Status   Specimen Description BLOOD LEFT ANTECUBITAL   Final   Special Requests BOTTLES DRAWN AEROBIC AND ANAEROBIC 4CC   Final   Culture  Setup Time     Final   Value: 05/24/2014 08:31     Performed at [facility]   Culture     Final   Value:        BLOOD CULTURE RECEIVED NO GROWTH TO DATE CULTURE WILL BE HELD FOR 5 DAYS BEFORE ISSUING A FINAL NEGATIVE REPORT     Performed at [facility]   Report Status PENDING   Incomplete  MRSA PCR SCREENING     Status: None   Collection Time    05/24/14  5:52 AM      Result Value Ref Range Status   MRSA by PCR NEGATIVE  NEGATIVE Final   Comment:            The GeneXpert MRSA Assay (FDA     approved for NASAL specimens  only), is one component of a     comprehensive MRSA colonization       surveillance program. It is not     intended to diagnose MRSA     infection nor to guide or     monitor treatment for     MRSA infections.    Radiology Reports Dg Chest Portable 1 View  05/24/2014   CLINICAL DATA:  Vomiting and diarrhea.  Low-grade fever.  EXAM: PORTABLE CHEST - 1 VIEW  COMPARISON:  Chest radiograph performed 04/30/2011, and CT of the chest performed 02/11/2014  FINDINGS: The lungs are well-aerated. Vascular congestion is noted. Mild left perihilar airspace opacity may reflect atelectasis or possibly mild pneumonia, given the patient's symptoms. Interstitial edema might have a similar appearance. A left midlung calcified granuloma is unchanged in appearance. There is no evidence of pleural effusion or pneumothorax.  The cardiomediastinal silhouette is borderline normal in size. The right-sided chest port is noted ending about the cavoatrial junction. No acute osseous abnormalities are seen.  IMPRESSION: Vascular congestion noted. Mild left perihilar airspace opacity may reflect atelectasis or possibly mild pneumonia. Interstitial edema might have a similar appearance.   Electronically Signed   By: Garald Balding M.D.   On: 05/24/2014 02:58   Dg Abd Acute W/chest  05/25/2014   CLINICAL DATA:  Cough ; improving nausea and vomiting  EXAM: ACUTE ABDOMEN SERIES (ABDOMEN 2 VIEW & CHEST 1 VIEW)  COMPARISON:  Portable chest x-ray of May 24, 2014 and a previous chest CT scan of Feb 11, 2014  FINDINGS: The lungs are adequately inflated. There are patchy areas of parenchymal density in the upper lobes bilaterally and in the left mid lung. There is a stable ovoid calcified nodule inferiorly in the left upper lobe. The heart and pulmonary vascularity normal. The Port-A-Cath appliance tip lies in the midportion of the SVC.  Within the abdomen there are loops of mildly distended gas-filled colon with small air-fluid levels. There are tiny air-fluid levels and small bowel loops. There is coarse  calcification the pelvis consistent with a uterine fibroid. There are degenerative changes of the lumbar spine.  IMPRESSION: *Patchy areas of interstitial density bilaterally may reflect known scarring in these regions. However, superimposed acute infection is not excluded. There is evidence of previous granulomatous infection. There is no evidence of CHF. *The bowel gas pattern is nonspecific and may reflect an ileus or gastroenteritis type process. There is no evidence of perforation.   Electronically Signed   By: David  [place]   On: 05/25/2014 10:15    CBC  Recent Labs Lab 05/24/14 0235 05/25/14 0332  WBC 11.5* 10.2  HGB 10.7* 10.2*  HCT 31.4* 31.1*  PLT 172 179  MCV 86.5 87.9  MCH 29.5 28.8  MCHC 34.1 32.8  RDW 14.9 15.1  LYMPHSABS 0.9  --   MONOABS 0.5  --   EOSABS 0.0  --   BASOSABS 0.0  --     Chemistries   Recent Labs Lab 05/24/14 0235 05/25/14 0332  NA 139 144  K 3.5* 3.7  CL 102 109  CO2 22 25  GLUCOSE 190* 92  BUN 15 5*  CREATININE 1.14* 1.15*  CALCIUM 9.6 9.2  AST 14 17  ALT 11 9  ALKPHOS 75 67  BILITOT 0.4 0.4   ------------------------------------------------------------------------------------------------------------------ estimated creatinine clearance is 38.6 ml/min (by C-G formula based on Cr of 1.15). ------------------------------------------------------------------------------------------------------------------ No results found for this basename: HGBA1C,  in the last 72 hours ------------------------------------------------------------------------------------------------------------------ No results  found for this basename: CHOL, HDL, LDLCALC, TRIG, CHOLHDL, LDLDIRECT,  in the last 72 hours ------------------------------------------------------------------------------------------------------------------  Recent Labs  05/24/14 0903  TSH 0.495    ------------------------------------------------------------------------------------------------------------------ No results found for this basename: VITAMINB12, FOLATE, FERRITIN, TIBC, IRON, RETICCTPCT,  in the last 72 hours  Coagulation profile  Recent Labs Lab 05/24/14 0236 05/25/14 0332  INR 1.03 1.15    No results found for this basename: DDIMER,  in the last 72 hours  Cardiac Enzymes  Recent Labs Lab 05/24/14 0236  TROPONINI <0.30   ------------------------------------------------------------------------------------------------------------------ No components found with this basename: POCBNP,      Time Spent in minutes  35   Lala Lund K M.D on 05/25/2014 at 11:23 AM  Between 7am to 7pm - Pager - [phone]  After 7pm go to [email or web address] - password [email or web address]  And look for the night coverage person covering for me after hours  [facility] Office  [phone]   **Disclaimer: This note may have been dictated with voice recognition software. Similar sounding words can inadvertently be transcribed and this note may contain transcription errors which may not have been corrected upon publication of note.**

## 2014-05-25 NOTE — Progress Notes (Signed)
INITIAL NUTRITION ASSESSMENT  DOCUMENTATION CODES Per approved criteria  -Not Applicable   INTERVENTION: - Diet advancement per MD - Resource Breeze BID - Anti-diarrheals per MD - RD to continue to monitor  NUTRITION DIAGNOSIS: Inadequate oral intake related to clear liquid diet as evidenced by diet order.   Goal: 1. Resolution of diarrhea 2. Advance diet as tolerated to regular diet  Monitor:  Weights, labs, diet advancement, diarhea  Reason for Assessment: Malnutrition screening tool   73 y.o. 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 She has nausea, vomiting, and diarrha as a side effect of the medication. Ordinarily, Imodium is sufficient to control these symptoms, however beginning at 1800 on 8/10 she began having uncontrollable nausea, vomiting, and diarrhea. She has had chills, but no fever.  - Met with pt who reports she's been eating small meals at home of things like baked chicken, vegetables, and fruit - Said if she tried to eat large portions of food, she would have diarrhea within 1 hour of eating - Said she was drinking Ensure during CA treatment but was told she didn't need to drink it anymore and stopped drinking it because it gave her diarrhea - Has been able to keep down cranberry juice and broth for both clear liquid meals today - Denies any nausea/vomiting and said she's had 3 episodes of diarrhea today - Has been coughing up yellow and green sputum - Nutrition focused physical exam completed and was WNL   Height: Ht Readings from Last 1 Encounters:  05/24/14 '5\' 5"'  (1.651 m)    Weight: Wt Readings from Last 1 Encounters:  05/25/14 136 lb 11 oz (62 kg)    Ideal Body Weight: 125 lbs   % Ideal Body Weight: 109%  Wt Readings from Last 10 Encounters:  05/25/14 136 lb 11 oz (62 kg)  04/28/14 133 lb 3.2 oz (60.419 kg)  03/21/14 134 lb 8 oz (61.009 kg)  02/15/14 134  lb 11.2 oz (61.1 kg)  01/18/14 134 lb 12.8 oz (61.145 kg)  12/17/13 132 lb 6.4 oz (60.056 kg)  11/17/13 130 lb 6.4 oz (59.149 kg)  10/20/13 129 lb 6.4 oz (58.695 kg)  09/15/13 131 lb 4.8 oz (59.557 kg)  08/25/13 130 lb 8 oz (59.194 kg)    Usual Body Weight: 133 lbs   % Usual Body Weight: 102%  BMI:  Body mass index is 22.75 kg/(m^2).  Estimated Nutritional Needs: Kcal: 1550-1750 Protein: 65-80g Fluid: 1.5-1.7L/day   Skin: Intact   Diet Order: Clear Liquid  EDUCATION NEEDS: -No education needs identified at this time   Intake/Output Summary (Last 24 hours) at 05/25/14 1431 Last data filed at 05/25/14 1200  Gross per 24 hour  Intake 2927.5 ml  Output    625 ml  Net 2302.5 ml    Last BM: 8/12  Labs:   Recent Labs Lab 05/24/14 0235 05/25/14 0332  NA 139 144  K 3.5* 3.7  CL 102 109  CO2 22 25  BUN 15 5*  CREATININE 1.14* 1.15*  CALCIUM 9.6 9.2  GLUCOSE 190* 92    CBG (last 3)  No results found for this basename: GLUCAP,  in the last 72 hours  Scheduled Meds: . antiseptic oral rinse  7 mL Mouth Rinse BID  . chlorhexidine  15 mL Mouth Rinse BID  . dorzolamide-timolol  2 drop Both Eyes BID  . doxycycline  100 mg Oral Q12H  .  enoxaparin (LOVENOX) injection  40 mg Subcutaneous Q24H  . escitalopram  10 mg Oral Daily  . levothyroxine  88 mcg Oral QAC breakfast  . metronidazole  500 mg Intravenous Q8H  . nicotine  14 mg Transdermal Daily  . simvastatin  40 mg Oral q1800  . sodium chloride  10-40 mL Intracatheter Q12H  . sodium chloride  3 mL Intravenous Q12H    Continuous Infusions: . 0.9 % NaCl with KCl 20 mEq / L 75 mL/hr at 05/25/14 1130    Past Medical History  Diagnosis Date  . Lung cancer 03/08/2011    recurrent  . Hypertension   . Hypothyroidism   . Hypercholesterolemia   . Glaucoma   . Hip pain 09/02/2011  . Foot pain 09/02/2011    Past Surgical History  Procedure Laterality Date  . Video assisted thoracoscopy  09/28/2009  .  Thoracotomy  09/28/2009    mini  . Lung lobectomy  09/28/2009    right upper    Carlis Stable MS, RD, LDN [phone] Pager [phone] Weekend/After Hours Pager

## 2014-05-25 NOTE — Evaluation (Signed)
Physical Therapy Evaluation Patient Details Name: [name] MRN: [id number] DOB: 01-06-1941 Today's Date: 05/25/2014   History of Present Illness  73 yo female admitted with N/V/D, weakness. hx of lung cancer, HTN, hip pain. Pt lives alone.   Clinical Impression  On eval, pt required Min assist for mobility-able to ambulate ~175 feet without assistive device. Demonstrates general weakness, decreased activity tolerance, and impaired gait and balance. Discussed d/c plan-pt politely declines placement AND home health care. Pt does live alone.     Follow Up Recommendations Supervision - Intermittent (pt politely declines placement and home health)    Equipment Recommendations  None recommended by PT    Recommendations for Other Services OT consult     Precautions / Restrictions Precautions Precautions: Fall Precaution Comments: diarrhea Restrictions Weight Bearing Restrictions: No      Mobility  Bed Mobility Overal bed mobility: Modified Independent                Transfers Overall transfer level: Needs assistance   Transfers: Sit to/from Stand Sit to Stand: Min assist         General transfer comment: assist to stabilize. LOB x2 with initial standing.   Ambulation/Gait Ambulation/Gait assistance: Min assist;Min guard Ambulation Distance (Feet): 175 Feet Assistive device: None Gait Pattern/deviations: Trunk flexed;Decreased stride length;Step-through pattern     General Gait Details: slow gait speed. assist to stabilize intermittently; Min guard mostly  Stairs Stairs: Yes Stairs assistance: Min guard Stair Management: One rail Right;Step to pattern;Forwards Number of Stairs: 3 General stair comments: close guard for safety.   Wheelchair Mobility    Modified Rankin (Stroke Patients Only)       Balance Overall balance assessment: Needs assistance         Standing balance support: During functional activity Standing balance-Leahy Scale:  Fair                               Pertinent Vitals/Pain Pain Assessment: No/denies pain    Home Living Family/patient expects to be discharged to:: Private residence Living Arrangements: Alone   Type of Home: House Home Access: Stairs to enter Entrance Stairs-Rails: Right Entrance Stairs-Number of Steps: back- 4 steps Home Layout: Multi-level;Bed/bath upstairs Home Equipment: Cane - single point      Prior Function Level of Independence: Independent with assistive device(s)         Comments: uses cane sometimes     Hand Dominance        Extremity/Trunk Assessment   Upper Extremity Assessment: Generalized weakness           Lower Extremity Assessment: Generalized weakness      Cervical / Trunk Assessment: Normal  Communication   Communication: No difficulties  Cognition Arousal/Alertness: Awake/alert Behavior During Therapy: WFL for tasks assessed/performed Overall Cognitive Status: Within Functional Limits for tasks assessed                      General Comments      Exercises        Assessment/Plan    PT Assessment Patient needs continued PT services  PT Diagnosis Difficulty walking;Generalized weakness   PT Problem List Decreased strength;Decreased activity tolerance;Decreased balance;Decreased mobility  PT Treatment Interventions Gait training;Stair training;Functional mobility training;Therapeutic activities;Patient/family education;Balance training   PT Goals (Current goals can be found in the Care Plan section) Acute Rehab PT Goals Patient Stated Goal: home soon PT Goal Formulation: With patient  Time For Goal Achievement: 06/01/14 Potential to Achieve Goals: Good    Frequency Min 3X/week   Barriers to discharge        Co-evaluation               End of Session Equipment Utilized During Treatment: Gait belt Activity Tolerance: Patient tolerated treatment well Patient left: in bed;with call bell/phone  within reach           Time: 8978-4784 PT Time Calculation (min): 28 min   Charges:   PT Evaluation $Initial PT Evaluation Tier I: 1 Procedure PT Treatments $Gait Training: 23-37 mins   PT G Codes:          Weston Anna, MPT Pager: [phone]

## 2014-05-26 ENCOUNTER — Ambulatory Visit (HOSPITAL_COMMUNITY): Admission: RE | Admit: 2014-05-26 | Payer: Medicare Other | Source: Ambulatory Visit

## 2014-05-26 ENCOUNTER — Other Ambulatory Visit: Payer: Medicare Other

## 2014-05-26 ENCOUNTER — Ambulatory Visit: Payer: Medicare Other

## 2014-05-26 MED ORDER — ONDANSETRON HCL 4 MG PO TABS: 4.0000 mg | ORAL_TABLET | Freq: Three times a day (TID) | ORAL | Status: DC | PRN

## 2014-05-26 MED ORDER — LOPERAMIDE HCL 2 MG PO CAPS: 2.0000 mg | ORAL_CAPSULE | ORAL | Status: DC | PRN

## 2014-05-26 MED ORDER — BOOST / RESOURCE BREEZE PO LIQD: 1.0000 | Freq: Two times a day (BID) | ORAL | Status: DC

## 2014-05-26 MED ORDER — HEPARIN SOD (PORK) LOCK FLUSH 100 UNIT/ML IV SOLN: 500.0000 [IU] | INTRAVENOUS | Status: DC | PRN

## 2014-05-26 MED ORDER — DOXYCYCLINE HYCLATE 100 MG PO TABS: 100.0000 mg | ORAL_TABLET | Freq: Two times a day (BID) | ORAL | Status: DC

## 2014-05-26 MED ORDER — ERLOTINIB HCL 150 MG PO TABS: 150.0000 mg | ORAL_TABLET | Freq: Every day | ORAL | Status: DC

## 2014-05-26 NOTE — Progress Notes (Signed)
PT Cancellation Note  Patient Details Name: [name] MRN: [id number] DOB: January 10, 1941   Cancelled Treatment:    Reason Eval/Treat Not Completed: Other (comment) (pt declined PT, stated she's going home soon and needs to conserve energy for that. )   [name] 05/26/2014, 10:38 AM [phone]

## 2014-05-26 NOTE — Care Management Note (Signed)
    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 She has nausea, vomiting, and diarrha as a side effect of the medication. Ordinarily, Imodium is sufficient to control these symptoms, however beginning at 1800 on 8/10 she began     Action/Plan:   home when stable   Anticipated DC Date:  05/27/2014   Anticipated DC Plan:  HOME/SELF CARE  In-house referral  NA      [place]  Patient refused services      [facility] Choice  NA   Choice offered to / List presented to:  NA   DME arranged  NA      DME agency  NA     [place] arranged  NA      [place] agency  NA   Status of service:  Completed, signed off Medicare Important Message given?  NA - LOS <3 / Initial given by admissions (If response is "NO", the following Medicare IM given date fields will be blank) Date Medicare IM given:   Medicare IM given by:   Date Additional Medicare IM given:   Additional Medicare IM given by:    Discharge Disposition:  HOME/SELF CARE  Per UR Regulation:  Reviewed for med. necessity/level of care/duration of stay  If discussed at [place] of Stay Meetings, dates discussed:    Comments:  05/26/14 Bravlio Luca RN,BSN NCM [phone] PATIENT PLEASANTLY DECLINES ANY [place].D/C HOME NO NEEDS OR ORDERS.  Rhonda Davis,RN,BSN,CCM

## 2014-05-26 NOTE — Discharge Summary (Signed)
[name], is a 73 y.o. female  DOB October 22, 1940  MRN [id number].  Admission date:  05/24/2014  Admitting Physician  Grand [place]ine, DO  Discharge Date:  05/26/2014   Primary MD  [name], NP  Recommendations for primary care physician for things to follow:   Repeat CBC, CMP and two-view chest x-ray within 5-7 days. Kindly follow final GI pathogen panel results.   Admission Diagnosis  Diarrhea [787.91]   Discharge Diagnosis  Diarrhea [787.91] and nausea vomiting likely viral gastroenteritis versus side effect of Tarceva  Active Problems:   Diarrhea   Nausea and vomiting   Sepsis   Hypothermia   Hypokalemia   Nausea vomiting and diarrhea      Past Medical History  Diagnosis Date  . Lung cancer 03/08/2011    recurrent  . Hypertension   . Hypothyroidism   . Hypercholesterolemia   . Glaucoma   . Hip pain 09/02/2011  . Foot pain 09/02/2011    Past Surgical History  Procedure Laterality Date  . Video assisted thoracoscopy  09/28/2009  . Thoracotomy  09/28/2009    mini  . 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 She has nausea, vomiting, and diarrha as a side effect of the medication. Ordinarily, Imodium is sufficient to control these symptoms, however beginning at 1800 on 8/10 she began having uncontrollable nausea, vomiting, and diarrhea. She has had chills, but no fever. She is very weak and cold.    Hospital Course    1. 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 C. difficile PCR was negative. Abdominal x-ray nonacute and nonspecific, she is now close to baseline, we'll continue her antimotility agents at home. Give her some Zofran in case she develops nausea again, requested to follow with PCP within a week, also with her oncologist in the next 3-4 days to make sure if Tarceva could be source of her nausea vomiting and diarrhea. And whether this could be switched to another medication.   2. Productive cough. Question of pneumonia on chest x-ray and CT scan likely community-acquired. Sputum Gram stain culture was unremarkable, afebrile without leukocytosis has responded well to oral tetracycline will place her on 5 more days of oral doxycycline and follow with PCP in a week. Request PCP to repeat CBC, CMP and a 2 view chest x-ray next visit.    3. Hypokalemia replaced and stable.  4. Sepsis and hypotension due to #1 above. Resolved. Gentle IV fluids to continue.    5. Acute renal failure due to #1 above resolved with IV fluids.    6. History of lung cancer. Follows with Dr. Julien Nordmann outpatient continue post discharge. On Tarceva.    Discharge Condition: stable   Follow UP  Follow-up Information   Follow up with [name], NP. Schedule an appointment as soon as possible for a visit in 1 week.   Specialty:  Nurse Practitioner   Contact information:   [phone] [address] [address] [phone]       Follow up with Eilleen Kempf., MD. Schedule an appointment as soon as possible for a visit in 3 days.   Specialty:  Oncology   Contact information:   [place] [address] [phone]         Discharge Instructions  and  Discharge Medications      Discharge Instructions     Diet - low sodium heart healthy    Complete by:  As directed      Discharge instructions    Complete by:  As directed   Follow with Primary MD [name], NP in 7 days and your Oncologist in 3-4 days  Get CBC, CMP, 2 view Chest X ray checked  by Primary MD next visit.    Activity: As tolerated with Full fall precautions use walker/cane & assistance as needed   Disposition Home     Diet: Heart Healthy   For Heart failure patients - Check your Weight same time everyday, if you gain over 2 pounds, or you develop in leg swelling, experience more shortness of breath or chest pain, call your Primary MD immediately. Follow Cardiac Low Salt Diet and 1.8 lit/day fluid restriction.   On your next visit with her primary care physician please Get Medicines reviewed and adjusted.  Please request your Prim.MD to go over all Hospital Tests and Procedure/Radiological results at the follow up, please get all Hospital records sent to your Prim MD by signing hospital release before you go home.   If you experience worsening of your admission symptoms, develop shortness of breath, life threatening emergency, suicidal or homicidal thoughts you must seek medical attention immediately by calling 911 or calling your MD immediately  if symptoms less severe.  You Must read complete instructions/literature along with all the possible adverse reactions/side effects for all the Medicines you take and that have been prescribed to you. Take any new Medicines after you have completely understood and accpet all the possible adverse reactions/side effects.   Do not drive, operating heavy machinery, perform activities at heights, swimming or participation in water activities or provide baby sitting services if your were admitted for syncope or siezures until you have seen by Primary MD or a Neurologist and advised to do so again.  Do not drive when taking Pain medications.    Do not take more than prescribed  Pain, Sleep and Anxiety Medications  Special Instructions: If you have smoked or chewed Tobacco  in the last 2 yrs please stop smoking, stop any regular Alcohol  and or any Recreational drug use.  Wear Seat belts while driving.   Please note  You were cared for by a hospitalist during your hospital stay. If you have any questions about your discharge medications or the care you received while you were in the hospital after you are discharged, you can call the unit and asked to  speak with the hospitalist on call if the hospitalist that took care of you is not available. Once you are discharged, your primary care physician will handle any further medical issues. Please note that NO REFILLS for any discharge medications will be authorized once you are discharged, as it is imperative that you return to your primary care physician (or establish a relationship with a primary care physician if you do not have one) for your aftercare needs so that they can reassess your need for medications and monitor your lab values.     Increase activity slowly    Complete by:  As directed             Medication List         amLODipine 5 MG tablet  Commonly known as:  NORVASC  Take 10 mg by mouth daily. Per [name], NP     clindamycin 1 % lotion  Commonly known as:  CLEOCIN T  Apply topically 2 (two) times daily. Apply twice daily as needed     clonazePAM 0.5 MG tablet  Commonly known as:  KLONOPIN  Take 0.5 mg by mouth 2 (two) times daily as needed. Per [name], NP     cyanocobalamin 1000 MCG/ML injection  Commonly known as:  (VITAMIN B-12)  Inject 1,000 mcg into the muscle every 30 (thirty) days.     diphenoxylate-atropine 2.5-0.025 MG per tablet  Commonly known as:  LOMOTIL  Take 1 to 2 tablets by mouth every 6 hours as needed for diarrhea     dorzolamide-timolol 22.3-6.8 MG/ML ophthalmic solution  Commonly known as:  COSOPT  2 drops 2 (two) times daily. Per [name], NP      doxycycline 100 MG tablet  Commonly known as:  VIBRA-TABS  Take 1 tablet (100 mg total) by mouth every 12 (twelve) hours.     erlotinib 150 MG tablet  Commonly known as:  TARCEVA  Take 1 tablet (150 mg total) by mouth daily. Take on an empty stomach 1 hour before meals or 2 hours after. Discussed with her oncologist about this medication as discussed be causing nausea vomiting.     escitalopram 10 MG tablet  Commonly known as:  LEXAPRO  Take 10 mg by mouth daily.     feeding supplement (RESOURCE BREEZE) Liqd  Take 1 Container by mouth 2 (two) times daily between meals.     levothyroxine 88 MCG tablet  Commonly known as:  SYNTHROID, LEVOTHROID  Take 88 mcg by mouth daily before breakfast.     lidocaine-prilocaine cream  Commonly known as:  EMLA  Apply 1 application topically as needed (prior to port access).     lisinopril 40 MG tablet  Commonly known as:  PRINIVIL,ZESTRIL  Take 40 mg by mouth daily. Per [name], NP     loperamide 2 MG capsule  Commonly known as:  IMODIUM  Take 1 capsule (2 mg total) by mouth every 4 (four) hours as needed for diarrhea or loose stools.     ondansetron 4 MG tablet  Commonly known as:  ZOFRAN  Take 1 tablet (4 mg total) by mouth every 8 (eight) hours as needed for nausea or vomiting.     potassium chloride 10 MEQ tablet  Commonly known as:  K-DUR  Take 2 tablets (20 mEq total) by mouth 2 (two) times daily.     pravastatin 80 MG tablet  Commonly known as:  PRAVACHOL  Take 80 mg by mouth daily.  prochlorperazine 10 MG tablet  Commonly known as:  COMPAZINE  Take 10 mg by mouth every 6 (six) hours as needed (for nausea).          Diet and Activity recommendation: See Discharge Instructions above   Consults obtained - none   Major procedures and Radiology Reports - PLEASE review detailed and final reports for all details, in brief -       Dg Chest Portable 1 View  05/24/2014   CLINICAL DATA:  Vomiting and diarrhea.   Low-grade fever.  EXAM: PORTABLE CHEST - 1 VIEW  COMPARISON:  Chest radiograph performed 04/30/2011, and CT of the chest performed 02/11/2014  FINDINGS: The lungs are well-aerated. Vascular congestion is noted. Mild left perihilar airspace opacity may reflect atelectasis or possibly mild pneumonia, given the patient's symptoms. Interstitial edema might have a similar appearance. A left midlung calcified granuloma is unchanged in appearance. There is no evidence of pleural effusion or pneumothorax.  The cardiomediastinal silhouette is borderline normal in size. The right-sided chest port is noted ending about the cavoatrial junction. No acute osseous abnormalities are seen.  IMPRESSION: Vascular congestion noted. Mild left perihilar airspace opacity may reflect atelectasis or possibly mild pneumonia. Interstitial edema might have a similar appearance.   Electronically Signed   By: Garald Balding M.D.   On: 05/24/2014 02:58   Dg Abd Acute W/chest  05/25/2014   CLINICAL DATA:  Cough ; improving nausea and vomiting  EXAM: ACUTE ABDOMEN SERIES (ABDOMEN 2 VIEW & CHEST 1 VIEW)  COMPARISON:  Portable chest x-ray of May 24, 2014 and a previous chest CT scan of Feb 11, 2014  FINDINGS: The lungs are adequately inflated. There are patchy areas of parenchymal density in the upper lobes bilaterally and in the left mid lung. There is a stable ovoid calcified nodule inferiorly in the left upper lobe. The heart and pulmonary vascularity normal. The Port-A-Cath appliance tip lies in the midportion of the SVC.  Within the abdomen there are loops of mildly distended gas-filled colon with small air-fluid levels. There are tiny air-fluid levels and small bowel loops. There is coarse calcification the pelvis consistent with a uterine fibroid. There are degenerative changes of the lumbar spine.  IMPRESSION: *Patchy areas of interstitial density bilaterally may reflect known scarring in these regions. However, superimposed acute  infection is not excluded. There is evidence of previous granulomatous infection. There is no evidence of CHF. *The bowel gas pattern is nonspecific and may reflect an ileus or gastroenteritis type process. There is no evidence of perforation.   Electronically Signed   By: David  Martinique   On: 05/25/2014 10:15    Micro Results      Recent Results (from the past 240 hour(s))  URINE CULTURE     Status: None   Collection Time    05/24/14  2:20 AM      Result Value Ref Range Status   Specimen Description URINE, CATHETERIZED   Final   Special Requests NONE   Final   Culture  Setup Time     Final   Value: 05/24/2014 10:01     Performed at [facility]     Final   Value: NO GROWTH     Performed at [facility]   Culture     Final   Value: NO GROWTH     Performed at [facility]   Report Status 05/25/2014 FINAL   Final  CULTURE, BLOOD (ROUTINE X  2)     Status: None   Collection Time    05/24/14  2:35 AM      Result Value Ref Range Status   Specimen Description BLOOD RIGHT ANTECUBITAL   Final   Special Requests BOTTLES DRAWN AEROBIC AND ANAEROBIC 3CC   Final   Culture  Setup Time     Final   Value: 05/24/2014 08:31     Performed at [facility]   Culture     Final   Value:        BLOOD CULTURE RECEIVED NO GROWTH TO DATE CULTURE WILL BE HELD FOR 5 DAYS BEFORE ISSUING A FINAL NEGATIVE REPORT     Performed at [facility]   Report Status PENDING   Incomplete  CULTURE, BLOOD (ROUTINE X 2)     Status: None   Collection Time    05/24/14  2:36 AM      Result Value Ref Range Status   Specimen Description BLOOD LEFT ANTECUBITAL   Final   Special Requests BOTTLES DRAWN AEROBIC AND ANAEROBIC 4CC   Final   Culture  Setup Time     Final   Value: 05/24/2014 08:31     Performed at [facility]   Culture     Final   Value:        BLOOD CULTURE RECEIVED NO GROWTH TO DATE CULTURE WILL BE HELD FOR 5 DAYS BEFORE ISSUING A FINAL NEGATIVE  REPORT     Performed at [facility]   Report Status PENDING   Incomplete  MRSA PCR SCREENING     Status: None   Collection Time    05/24/14  5:52 AM      Result Value Ref Range Status   MRSA by PCR NEGATIVE  NEGATIVE Final   Comment:            The GeneXpert MRSA Assay (FDA     approved for NASAL specimens     only), is one component of a     comprehensive MRSA colonization     surveillance program. It is not     intended to diagnose MRSA     infection nor to guide or     monitor treatment for     MRSA infections.  CLOSTRIDIUM DIFFICILE BY PCR     Status: None   Collection Time    05/25/14  5:01 AM      Result Value Ref Range Status   C difficile by pcr NEGATIVE  NEGATIVE Final   Comment: Performed at [facility], EXPECTORATED SPUTUM-ASSESSMENT     Status: None   Collection Time    05/25/14  1:10 PM      Result Value Ref Range Status   Specimen Description SPUTUM   Final   Special Requests NONE   Final   Sputum evaluation     Final   Value: THIS SPECIMEN IS ACCEPTABLE. RESPIRATORY CULTURE REPORT TO FOLLOW.   Report Status 05/25/2014 FINAL   Final  CULTURE, RESPIRATORY (NON-EXPECTORATED)     Status: None   Collection Time    05/25/14  1:10 PM      Result Value Ref Range Status   Specimen Description SPUTUM   Final   Special Requests NONE   Final   Gram Stain PENDING   Incomplete   Culture     Final   Value: NORMAL OROPHARYNGEAL FLORA     Performed at [facility]   Report Status  PENDING   Incomplete       Today   Subjective:   [name] today has no headache,no chest abdominal pain,no new weakness tingling or numbness, feels much better wants to go home today.    Objective:   Blood pressure 148/62, pulse 72, temperature 98.9 F (37.2 C), temperature source Oral, resp. rate 20, height 5\' 5"  (1.651 m), weight 62 kg (136 lb 11 oz), SpO2 94.00%.   Intake/Output Summary (Last 24 hours) at 05/26/14 1024 Last data filed at  05/26/14 0500  Gross per 24 hour  Intake 1762.5 ml  Output      0 ml  Net 1762.5 ml    Exam Awake Alert, Oriented x 3, No new F.N deficits, Normal affect [place].AT,PERRAL Supple Neck,No JVD, No cervical lymphadenopathy appriciated.  Symmetrical Chest wall movement, Good air movement bilaterally, CTAB RRR,No Gallops,Rubs or new Murmurs, No Parasternal Heave +ve B.Sounds, Abd Soft, Non tender, No organomegaly appriciated, No rebound -guarding or rigidity. No Cyanosis, Clubbing or edema, No new Rash or bruise  Data Review   CBC w Diff: Lab Results  Component Value Date   WBC 10.2 05/25/2014   WBC 8.0 04/28/2014   HGB 10.2* 05/25/2014   HGB 11.1* 04/28/2014   HCT 31.1* 05/25/2014   HCT 33.9* 04/28/2014   PLT 179 05/25/2014   PLT 166 04/28/2014   LYMPHOPCT 8* 05/24/2014   LYMPHOPCT 20.5 04/28/2014   MONOPCT 4 05/24/2014   MONOPCT 8.1 04/28/2014   EOSPCT 0 05/24/2014   EOSPCT 2.1 04/28/2014   BASOPCT 0 05/24/2014   BASOPCT 0.4 04/28/2014    CMP: Lab Results  Component Value Date   NA 144 05/25/2014   NA 142 04/28/2014   NA 142 03/08/2011   K 3.7 05/25/2014   K 3.8 04/28/2014   K 4.2 03/08/2011   CL 109 05/25/2014   CL 107 03/30/2013   CL 102 03/08/2011   CO2 25 05/25/2014   CO2 26 04/28/2014   CO2 27 03/08/2011   BUN 5* 05/25/2014   BUN 14.1 04/28/2014   BUN 9 03/08/2011   CREATININE 1.15* 05/25/2014   CREATININE 1.2* 04/28/2014   CREATININE 1.0 03/08/2011   PROT 5.7* 05/25/2014   PROT 6.7 04/28/2014   PROT 7.2 03/08/2011   ALBUMIN 2.9* 05/25/2014   ALBUMIN 3.6 04/28/2014   BILITOT 0.4 05/25/2014   BILITOT 1.11 04/28/2014   BILITOT 0.50 03/08/2011   ALKPHOS 67 05/25/2014   ALKPHOS 82 04/28/2014   ALKPHOS 59 03/08/2011   AST 17 05/25/2014   AST 13 04/28/2014   AST 19 03/08/2011   ALT 9 05/25/2014   ALT 12 04/28/2014   ALT 18 03/08/2011  .   Total Time in preparing paper work, data evaluation and todays exam - 35 minutes  Thurnell Lose M.D on 05/26/2014 at 10:24 AM  [facility] Office  [phone]   **Disclaimer: This note may have been dictated with voice recognition software. Similar sounding words can inadvertently be transcribed and this note may contain transcription errors which may not have been corrected upon publication of note.**

## 2014-05-26 NOTE — Progress Notes (Signed)
05/26/14 1355  Reviewed discharge instructions with patient. Patient verbalized understanding of discharge instructions.  Copy of discharge instructions and prescription given to patient.  Patient port a cath deacessed and Hep locked.

## 2014-05-26 NOTE — Discharge Instructions (Signed)
Follow with Primary MD [name], NP in 7 days and your Oncologist in 3-4 days  Get CBC, CMP, 2 view Chest X ray checked  by Primary MD next visit.    Activity: As tolerated with Full fall precautions use walker/cane & assistance as needed   Disposition Home     Diet: Heart Healthy   For Heart failure patients - Check your Weight same time everyday, if you gain over 2 pounds, or you develop in leg swelling, experience more shortness of breath or chest pain, call your Primary MD immediately. Follow Cardiac Low Salt Diet and 1.8 lit/day fluid restriction.   On your next visit with her primary care physician please Get Medicines reviewed and adjusted.  Please request your Prim.MD to go over all Hospital Tests and Procedure/Radiological results at the follow up, please get all Hospital records sent to your Prim MD by signing hospital release before you go home.   If you experience worsening of your admission symptoms, develop shortness of breath, life threatening emergency, suicidal or homicidal thoughts you must seek medical attention immediately by calling 911 or calling your MD immediately  if symptoms less severe.  You Must read complete instructions/literature along with all the possible adverse reactions/side effects for all the Medicines you take and that have been prescribed to you. Take any new Medicines after you have completely understood and accpet all the possible adverse reactions/side effects.   Do not drive, operating heavy machinery, perform activities at heights, swimming or participation in water activities or provide baby sitting services if your were admitted for syncope or siezures until you have seen by Primary MD or a Neurologist and advised to do so again.  Do not drive when taking Pain medications.    Do not take more than prescribed Pain, Sleep and Anxiety Medications  Special Instructions: If you have smoked or chewed Tobacco  in the last 2 yrs please  stop smoking, stop any regular Alcohol  and or any Recreational drug use.  Wear Seat belts while driving.   Please note  You were cared for by a hospitalist during your hospital stay. If you have any questions about your discharge medications or the care you received while you were in the hospital after you are discharged, you can call the unit and asked to speak with the hospitalist on call if the hospitalist that took care of you is not available. Once you are discharged, your primary care physician will handle any further medical issues. Please note that NO REFILLS for any discharge medications will be authorized once you are discharged, as it is imperative that you return to your primary care physician (or establish a relationship with a primary care physician if you do not have one) for your aftercare needs so that they can reassess your need for medications and monitor your lab values.

## 2014-05-27 LAB — CULTURE, RESPIRATORY

## 2014-05-27 LAB — CULTURE, RESPIRATORY W GRAM STAIN: Culture: NORMAL

## 2014-05-30 ENCOUNTER — Other Ambulatory Visit: Payer: Self-pay

## 2014-05-30 ENCOUNTER — Telehealth: Payer: Self-pay | Admitting: *Deleted

## 2014-05-30 LAB — CULTURE, BLOOD (ROUTINE X 2)
CULTURE: NO GROWTH
CULTURE: NO GROWTH

## 2014-05-30 MED ORDER — BOOST / RESOURCE BREEZE PO LIQD: 1.0000 | Freq: Two times a day (BID) | ORAL | Status: DC

## 2014-05-30 NOTE — Telephone Encounter (Signed)
Patient called stating that she missed her CT scan because she was in the hospital.  Advised she call central scheduling to r/s her scan p/t f/u appt. Pt verbalized understanding of ph # to central scheduling.  SLJ

## 2014-05-31 ENCOUNTER — Other Ambulatory Visit: Payer: Self-pay | Admitting: *Deleted

## 2014-05-31 NOTE — Progress Notes (Signed)
Pt called stating she is still having a lot of diarrhea stating "it is just running out of me".  She is only taking 1 imodium tablet daily.  She has not taken the tarceva in 1 week.  Per Dr Vista Mink, delay appointments x 1 week to see if her symptoms subside.  Educated her on how to take imodium.  Pt verbalized understanding.  SLJ

## 2014-06-01 ENCOUNTER — Telehealth: Payer: Self-pay | Admitting: Internal Medicine

## 2014-06-01 ENCOUNTER — Ambulatory Visit (HOSPITAL_COMMUNITY): Payer: Medicare Other

## 2014-06-02 ENCOUNTER — Ambulatory Visit: Payer: Medicare Other | Admitting: Internal Medicine

## 2014-06-03 ENCOUNTER — Ambulatory Visit (HOSPITAL_COMMUNITY)
Admission: RE | Admit: 2014-06-03 | Discharge: 2014-06-03 | Disposition: A | Discharge status: Home / Self Care | Payer: Medicare Other | Source: Ambulatory Visit | Attending: Internal Medicine | Admitting: Internal Medicine

## 2014-06-03 ENCOUNTER — Encounter (HOSPITAL_COMMUNITY): Payer: Self-pay

## 2014-06-03 DIAGNOSIS — C3410 Malignant neoplasm of upper lobe, unspecified bronchus or lung: Secondary | ICD-10-CM | POA: Diagnosis present

## 2014-06-03 DIAGNOSIS — D71 Functional disorders of polymorphonuclear neutrophils: Secondary | ICD-10-CM | POA: Diagnosis not present

## 2014-06-03 MED ORDER — IOHEXOL 300 MG/ML  SOLN
80.0000 mL | Freq: Once | INTRAMUSCULAR | Status: AC | PRN
  Administered 2014-06-03: 80 mL via INTRAVENOUS

## 2014-06-09 ENCOUNTER — Other Ambulatory Visit: Payer: Self-pay | Admitting: Medical Oncology

## 2014-06-09 ENCOUNTER — Ambulatory Visit (HOSPITAL_BASED_OUTPATIENT_CLINIC_OR_DEPARTMENT_OTHER): Payer: Medicare Other | Admitting: Physician Assistant

## 2014-06-09 ENCOUNTER — Ambulatory Visit (HOSPITAL_BASED_OUTPATIENT_CLINIC_OR_DEPARTMENT_OTHER): Payer: Medicare Other

## 2014-06-09 ENCOUNTER — Telehealth: Payer: Self-pay | Admitting: Internal Medicine

## 2014-06-09 ENCOUNTER — Encounter: Payer: Self-pay | Admitting: Physician Assistant

## 2014-06-09 VITALS — BP 162/57 | HR 64 | Temp 97.7°F | Resp 18 | Ht 65.0 in | Wt 130.3 lb

## 2014-06-09 DIAGNOSIS — R197 Diarrhea, unspecified: Secondary | ICD-10-CM

## 2014-06-09 DIAGNOSIS — E039 Hypothyroidism, unspecified: Secondary | ICD-10-CM

## 2014-06-09 DIAGNOSIS — C3410 Malignant neoplasm of upper lobe, unspecified bronchus or lung: Secondary | ICD-10-CM

## 2014-06-09 DIAGNOSIS — C3490 Malignant neoplasm of unspecified part of unspecified bronchus or lung: Secondary | ICD-10-CM

## 2014-06-09 DIAGNOSIS — I10 Essential (primary) hypertension: Secondary | ICD-10-CM

## 2014-06-09 DIAGNOSIS — E538 Deficiency of other specified B group vitamins: Secondary | ICD-10-CM

## 2014-06-09 MED ORDER — ERLOTINIB HCL 150 MG PO TABS: 150.0000 mg | ORAL_TABLET | Freq: Every day | ORAL | Status: DC

## 2014-06-09 MED ORDER — CYANOCOBALAMIN 1000 MCG/ML IJ SOLN
1000.0000 ug | Freq: Once | INTRAMUSCULAR | Status: AC
  Administered 2014-06-09: 1000 ug via INTRAMUSCULAR

## 2014-06-09 NOTE — Telephone Encounter (Signed)
gv adn printed appt sched and avs for pt for SEpt

## 2014-06-09 NOTE — Progress Notes (Addendum)
[facility] [phone]   [phone]  SHARED VISIT PROGRESS NOTE  Terri Levels, NP [phone] [place] [address]  DIAGNOSIS AND STAGE:  #1 Recurrent non-small cell lung cancer consistent with adenocarcinoma. This was initially diagnosed as stage IB (T2a N0 M0) well-differentiated adenocarcinoma of bronchioalveolar type in December 2010.  #2 Acute pulmonary embolism in the right lower lobe lobar, segmental and subsegmental sized pulmonary arteries, diagnosed on 11/04/2011.   PRIOR THERAPY:  1.Status post right upper lobectomy on September 28, 2009 under the care of Dr. Arlyce Dice. The patient refused adjuvant chemotherapy at that time. She had disease recurrence in June of 2012.  2.Status post 4 cycles of systemic chemotherapy with carboplatin for an AUC of 6 and paclitaxel at 200 mg per meter squared and Avastin at 15 mg/kg according to the ECOG protocol #5508.  3. Chemotherapy with Avastin 15 mg/kg according to the ECOG protocol #5508 status post 32 cycles. Last cycle was given on 06/30/2013 discontinued secondary to disease progression and persistent proteinuria.  CURRENT THERAPY: Tarceva 150 mg by mouth daily, started 08/09/2013, status post 9 months of treatment.  CHEMOTHERAPY INTENT: Palliative.  CURRENT # OF CHEMOTHERAPY CYCLES: 10 CURRENT ANTIEMETICS: Compazine  CURRENT SMOKING STATUS: Non-smoker  ORAL CHEMOTHERAPY AND CONSENT: Tarceva and consent was signed 07/21/2013.  CURRENT BISPHOSPHONATES USE: None  PAIN MANAGEMENT: No pain  NARCOTICS INDUCED CONSTIPATION: No constipation.  LIVING WILL AND CODE STATUS: Full code   INTERVAL HISTORY: [name] 73 y.o. female returns to the clinic today for followup visit. She was recently admitted for sudden significant nausea, vomiting and a change in her diarrhea.  Her Tarceva has been held since her hospital discharge. She is feeling significantly better, with resolution of the  nausea and vomiting.  She currently is not having issues with diarrhea. The patient is feeling fine today except for mild skin rash mainly on the face.  She denied having any chest pain but continues to have shortness of breath with exertion with no cough or hemoptysis. The patient denied having any fever or chills.   MEDICAL HISTORY: Past Medical History  Diagnosis Date  . Hypertension   . Hypothyroidism   . Hypercholesterolemia   . Glaucoma   . Hip pain 09/02/2011  . Foot pain 09/02/2011  . Lung cancer 03/08/2011    recurrent    ALLERGIES:  is allergic to amitriptyline; codeine; and sulfa antibiotics.  MEDICATIONS:  Current Outpatient Prescriptions  Medication Sig Dispense Refill  . amLODipine (NORVASC) 5 MG tablet Take 10 mg by mouth daily. Per [name], NP      . clindamycin (CLEOCIN T) 1 % lotion Apply topically 2 (two) times daily. Apply twice daily as needed  120 mL  0  . clonazePAM (KLONOPIN) 0.5 MG tablet Take 0.5 mg by mouth 2 (two) times daily as needed. Per [name], NP      . cyanocobalamin (,VITAMIN B-12,) 1000 MCG/ML injection Inject 1,000 mcg into the muscle every 30 (thirty) days.      . diphenoxylate-atropine (LOMOTIL) 2.5-0.025 MG per tablet Take 1 to 2 tablets by mouth every 6 hours as needed for diarrhea  30 tablet  1  . dorzolamide-timolol (COSOPT) 22.3-6.8 MG/ML ophthalmic solution 2 drops 2 (two) times daily. Per [name], NP       . doxycycline (VIBRA-TABS) 100 MG tablet Take 1 tablet (100 mg total) by mouth every 12 (twelve) hours.  10 tablet  0  .  erlotinib (TARCEVA) 150 MG tablet Take 1 tablet (150 mg total) by mouth daily. Take on an empty stomach 1 hour before meals or 2 hours after. Discussed with her oncologist about this medication as discussed be causing nausea vomiting.  30 tablet  1  . levothyroxine (SYNTHROID, LEVOTHROID) 88 MCG tablet Take 88 mcg by mouth daily before breakfast.      . lidocaine-prilocaine (EMLA) cream Apply 1  application topically as needed (prior to port access).       Marland Kitchen lisinopril (PRINIVIL,ZESTRIL) 40 MG tablet Take 40 mg by mouth daily. Per [name], NP       . ondansetron (ZOFRAN) 4 MG tablet Take 1 tablet (4 mg total) by mouth every 8 (eight) hours as needed for nausea or vomiting.  20 tablet  0  . prochlorperazine (COMPAZINE) 10 MG tablet Take 10 mg by mouth every 6 (six) hours as needed (for nausea).       . escitalopram (LEXAPRO) 10 MG tablet Take 10 mg by mouth daily.       . feeding supplement, RESOURCE BREEZE, (RESOURCE BREEZE) LIQD Take 1 Container by mouth 2 (two) times daily between meals.  60 Container  0  . loperamide (IMODIUM) 2 MG capsule Take 1 capsule (2 mg total) by mouth every 4 (four) hours as needed for diarrhea or loose stools.  30 capsule  0  . potassium chloride (K-DUR) 10 MEQ tablet Take 2 tablets (20 mEq total) by mouth 2 (two) times daily.  10 tablet  0  . pravastatin (PRAVACHOL) 80 MG tablet Take 80 mg by mouth daily.        No current facility-administered medications for this visit.    SURGICAL HISTORY:  Past Surgical History  Procedure Laterality Date  . Video assisted thoracoscopy  09/28/2009  . Thoracotomy  09/28/2009    mini  . Lung lobectomy  09/28/2009    right upper    REVIEW OF SYSTEMS:  Constitutional: positive for fatigue Eyes: negative Ears, nose, mouth, throat, and face: negative Respiratory: negative Cardiovascular: negative Gastrointestinal: negative Genitourinary:negative Integument/breast: Acne-like skin rash on the face especially the nose area. Hematologic/lymphatic: negative Musculoskeletal:negative Neurological: positive for paresthesia Behavioral/Psych: negative Endocrine: negative Allergic/Immunologic: negative   PHYSICAL EXAMINATION: General appearance: alert, cooperative, fatigued and no distress Head: Normocephalic, without obvious abnormality, atraumatic Neck: no adenopathy, no JVD, supple, symmetrical, trachea  midline and thyroid not enlarged, symmetric, no tenderness/mass/nodules Lymph nodes: Cervical, supraclavicular, and axillary nodes normal. Resp: clear to auscultation bilaterally Back: symmetric, no curvature. ROM normal. No CVA tenderness. Cardio: regular rate and rhythm, S1, S2 normal, no murmur, click, rub or gallop GI: soft, non-tender; bowel sounds normal; no masses,  no organomegaly Extremities: extremities normal, atraumatic, no cyanosis or edema Neurologic: Alert and oriented X 3, normal strength and tone. Normal symmetric reflexes. Normal coordination and gait  ECOG PERFORMANCE STATUS: 1 - Symptomatic but completely ambulatory  Blood pressure 162/57, pulse 64, temperature 97.7 F (36.5 C), temperature source Oral, resp. rate 18, height 5\' 5"  (1.651 m), weight 130 lb 4.8 oz (59.104 kg).  LABORATORY DATA: Lab Results  Component Value Date   WBC 10.2 05/25/2014   HGB 10.2* 05/25/2014   HCT 31.1* 05/25/2014   MCV 87.9 05/25/2014   PLT 179 05/25/2014      Chemistry      Component Value Date/Time   NA 144 05/25/2014 0332   NA 142 04/28/2014 1052   NA 142 03/08/2011 0754   K 3.7 05/25/2014 0332  K 3.8 04/28/2014 1052   K 4.2 03/08/2011 0754   CL 109 05/25/2014 0332   CL 107 03/30/2013 1036   CL 102 03/08/2011 0754   CO2 25 05/25/2014 0332   CO2 26 04/28/2014 1052   CO2 27 03/08/2011 0754   BUN 5* 05/25/2014 0332   BUN 14.1 04/28/2014 1052   BUN 9 03/08/2011 0754   CREATININE 1.15* 05/25/2014 0332   CREATININE 1.2* 04/28/2014 1052   CREATININE 1.0 03/08/2011 0754      Component Value Date/Time   CALCIUM 9.2 05/25/2014 0332   CALCIUM 9.7 04/28/2014 1052   CALCIUM 9.5 03/08/2011 0754   ALKPHOS 67 05/25/2014 0332   ALKPHOS 82 04/28/2014 1052   ALKPHOS 59 03/08/2011 0754   AST 17 05/25/2014 0332   AST 13 04/28/2014 1052   AST 19 03/08/2011 0754   ALT 9 05/25/2014 0332   ALT 12 04/28/2014 1052   ALT 18 03/08/2011 0754   BILITOT 0.4 05/25/2014 0332   BILITOT 1.11 04/28/2014 1052   BILITOT 0.50  03/08/2011 0754     RADIOLOGY RESULTS: Ct Chest W Contrast  06/03/2014   CLINICAL DATA:  Lung cancer.  EXAM: CT CHEST WITH CONTRAST  TECHNIQUE: Multidetector CT imaging of the chest was performed during intravenous contrast administration.  CONTRAST:  48mL OMNIPAQUE IOHEXOL 300 MG/ML  SOLN  COMPARISON:  02/11/2014  FINDINGS: Soft tissue / Mediastinum: The tip of the right-sided Port-A-Cath is positioned at the junction of the SVC and RA. No axillary lymphadenopathy. No mediastinal or hilar lymphadenopathy. Calcified nodal tissue is seen in the left hilum. Heart size is normal. Coronary artery calcification is noted. No pericardial effusion.  Lungs / Pleura: Patient is status post right upper lobectomy. Stable appearance of scarring in the anterior right upper lung. There is a new 8 x 12 mm nodular density in the right upper lung on image 16. As previously, the patient has bilateral solid and sub solid lesions. A mixed lesion in the left lower lobe, superior segment, measures 5.3 cm in maximum diameter when measured at the same level was on the previous exam when it was 5.4 cm. Sub solid lesion in the medial right lower lobe (image 33 series 5) is 3.0 cm today compared to 3.1 cm previously. Sub solid nodule lesion measured in the medial aspect of the right lower lobe near the hemidiaphragm (image 42 series 5 today) is unchanged at 11 mm.  Bones: Bone windows reveal no worrisome lytic or sclerotic osseous lesions.  Upper Abdomen:  No adrenal nodule or mass.  IMPRESSION: There is a new 8 x 12 mm nodular density in the right upper lung. This may be related to infection/ inflammation, but close continued attention on followup imaging recommended.  Stable appearance of bilateral sub solid pulmonary lesions.  Granulomatous disease in the chest.   Electronically Signed   By: Misty Stanley M.D.   On: 06/03/2014 14:00   Dg Chest Portable 1 View  05/24/2014   CLINICAL DATA:  Vomiting and diarrhea.  Low-grade fever.   EXAM: PORTABLE CHEST - 1 VIEW  COMPARISON:  Chest radiograph performed 04/30/2011, and CT of the chest performed 02/11/2014  FINDINGS: The lungs are well-aerated. Vascular congestion is noted. Mild left perihilar airspace opacity may reflect atelectasis or possibly mild pneumonia, given the patient's symptoms. Interstitial edema might have a similar appearance. A left midlung calcified granuloma is unchanged in appearance. There is no evidence of pleural effusion or pneumothorax.  The cardiomediastinal silhouette is borderline normal  in size. The right-sided chest port is noted ending about the cavoatrial junction. No acute osseous abnormalities are seen.  IMPRESSION: Vascular congestion noted. Mild left perihilar airspace opacity may reflect atelectasis or possibly mild pneumonia. Interstitial edema might have a similar appearance.   Electronically Signed   By: Garald Balding M.D.   On: 05/24/2014 02:58   Dg Abd Acute W/chest  05/25/2014   CLINICAL DATA:  Cough ; improving nausea and vomiting  EXAM: ACUTE ABDOMEN SERIES (ABDOMEN 2 VIEW & CHEST 1 VIEW)  COMPARISON:  Portable chest x-ray of May 24, 2014 and a previous chest CT scan of Feb 11, 2014  FINDINGS: The lungs are adequately inflated. There are patchy areas of parenchymal density in the upper lobes bilaterally and in the left mid lung. There is a stable ovoid calcified nodule inferiorly in the left upper lobe. The heart and pulmonary vascularity normal. The Port-A-Cath appliance tip lies in the midportion of the SVC.  Within the abdomen there are loops of mildly distended gas-filled colon with small air-fluid [name]. There are tiny air-fluid [name] and small bowel loops. There is coarse calcification the pelvis consistent with a uterine fibroid. There are degenerative changes of the lumbar spine.  IMPRESSION: *Patchy areas of interstitial density bilaterally may reflect known scarring in these regions. However, superimposed acute infection is not  excluded. There is evidence of previous granulomatous infection. There is no evidence of CHF. *The bowel gas pattern is nonspecific and may reflect an ileus or gastroenteritis type process. There is no evidence of perforation.   Electronically Signed   By: David  [place]   On: 05/25/2014 10:15  ASSESSMENT AND PLAN: This is a very pleasant 73 years old African American female with recurrent non-small cell lung cancer most recently treated with maintenance Avastin status post 32 cycles. She is currently on treatment with single agent Tarceva 150 mg by mouth daily status post 9 months and tolerating her treatment fairly well except for mild skin rash and occasional diarrhea. Her recent admission for nausea, vomiting and worsening diarrhea may have been caused by a virus. The patient was discussed with and also seen by Dr. Julien Nordmann. She has just received a new shipment of Tarceva 150 mg that is unopened. Hopefully she will be able to exchange this for Tarceva 100 mg. If not she will continue on Tarceva 150 mg daily but if she has significant side effects we will decrease her dose to 100 mg. Her restaging CT scan was relatively stable except for a new 8 x 12 mm nodular density in the right upper lung.This may be related to infection/inflammation. Close attention on follow up imaging was recommended. She will follow up in 1 month.  She will take one Imodium daily in the morning to help with her unexpected diarrhea. For the skin rash, she will continue to apply the clindamycin 1% lotion to the skin rash area twice a day. The patient was advised to call immediately if she has any concerning symptoms in the interval. The patient voices understanding of current disease status and treatment options and is in agreement with the current care plan.  All questions were answered. The patient knows to call the clinic with any problems, questions or concerns. We can certainly see the patient much sooner if  necessary.  Disclaimer: This note was dictated with voice recognition software. Similar sounding words can inadvertently be transcribed and may not be corrected upon review.  Carlton Adam PA-C  ADDENDUM: Hematology/Oncology Attending: I  had a face to face encounter with the patient. I recommended for her care plan. This is a very pleasant 73 years old [place] American female with recurrent non-small cell lung cancer, adenocarcinoma. The patient is currently on treatment with oral Tarceva 150 mg by mouth daily for the last 9 months and she is tolerating her treatment fairly well. Her recent CT scan of the chest showed no significant evidence for disease progression except for a nodular density in the right upper lobe suspicious to be inflammatory in origin but would require close observation. The patient was recently admitted to [facility] with viral gastroenteritis. She is feeling much better today and recovering very well. I would consider changing her dose of Tarceva to 100 mg by mouth daily because of the intermittent diarrhea. The patient would come back for follow up visit in one month's for reevaluation and management any adverse effect of her treatment. She was advised to call immediately if she has any concerning symptoms in the interval.  Disclaimer: This note was dictated with voice recognition software. Similar sounding words can inadvertently be transcribed and may be missed upon review. Eilleen Kempf., MD 06/12/2014

## 2014-06-09 NOTE — Patient Instructions (Signed)

## 2014-06-09 NOTE — Telephone Encounter (Signed)
tarceva rx will not escribe. Paper rx given to [place] and faxed to [facility]

## 2014-06-11 NOTE — Patient Instructions (Signed)
If you can exchange your unopened bottle of Tarceva 150 mg for 100 mg do so. If not continue on Tarceva 150 mg by mouth daily Follow up in 1 month

## 2014-06-13 ENCOUNTER — Telehealth: Payer: Self-pay | Admitting: *Deleted

## 2014-06-13 NOTE — Telephone Encounter (Signed)
Pt called stating that she has started back on the 150mg  dose.  She was unable to return the 150mg  dose and wanted to let Dr Vista Mink know.  Dr Vista Mink aware.  She can take 150mg  for now and he will change the dose when she is due for a refill.  Pt verbalized understanding.  Pt also c/o cough.  No fever, no green mucous.  Asked for pt to keep [place] posted, she has made an appt with PCP to further evaluate.  SLJ

## 2014-06-15 ENCOUNTER — Telehealth: Payer: Self-pay | Admitting: Medical Oncology

## 2014-06-15 ENCOUNTER — Other Ambulatory Visit: Payer: Self-pay | Admitting: Medical Oncology

## 2014-06-15 DIAGNOSIS — C3490 Malignant neoplasm of unspecified part of unspecified bronchus or lung: Secondary | ICD-10-CM

## 2014-06-15 MED ORDER — ERLOTINIB HCL 100 MG PO TABS: 100.0000 mg | ORAL_TABLET | Freq: Every day | ORAL | Status: DC

## 2014-06-15 NOTE — Telephone Encounter (Signed)
tarceva 100 mg rx sent to [facility]

## 2014-06-15 NOTE — Telephone Encounter (Signed)
She has tarceva 150 mg tablets for sept. She said she will finish the 150 mg tablets and start the lower dose 100 mg next refill.

## 2014-06-22 ENCOUNTER — Telehealth: Payer: Self-pay | Admitting: Medical Oncology

## 2014-06-22 NOTE — Telephone Encounter (Signed)
She is taking Tarceva 150 mg and still having diarrhea -she is having to wear adult diapers with peri pads and sometimes it seeps through diapers. She is waiting on the tarceva 100 mg tablets to be delivered. Per Dr Julien Nordmann I instructed pt to hold her tarceva until lower dose is delivered.

## 2014-06-29 ENCOUNTER — Telehealth: Payer: Self-pay | Admitting: *Deleted

## 2014-06-29 ENCOUNTER — Other Ambulatory Visit: Payer: Self-pay | Admitting: *Deleted

## 2014-06-29 DIAGNOSIS — C3490 Malignant neoplasm of unspecified part of unspecified bronchus or lung: Secondary | ICD-10-CM

## 2014-06-29 MED ORDER — ERLOTINIB HCL 100 MG PO TABS: 100.0000 mg | ORAL_TABLET | Freq: Every day | ORAL | Status: DC

## 2014-06-29 NOTE — Telephone Encounter (Signed)
Pt called stating that she has not heard back from [facility] about the tarceva.  Called and verified order with [facility].  Called rx in, they did not receive order from 9/2.  Pt also states that her stools have become more solid but she is still c/o incontinence.  She states she walks around and little BM's will come out while she is walking.  She is going to see her PCP tomorrow she states. Advised she keep that appt and let them know what is going on, also keep f/u with Dr Vista Mink next week and we will evaluate her at that time.  Asked her to call back if her stools become more loose or watery again.  She verbalized understanding.

## 2014-07-04 NOTE — Telephone Encounter (Signed)
RECEIVED A FAX FROM [facility] CONCERNING A CONFIRMATION OF FACSIMILE RECEIPT FOR REFERRAL.

## 2014-07-06 ENCOUNTER — Ambulatory Visit (HOSPITAL_BASED_OUTPATIENT_CLINIC_OR_DEPARTMENT_OTHER): Payer: Medicare Other

## 2014-07-06 ENCOUNTER — Ambulatory Visit (HOSPITAL_BASED_OUTPATIENT_CLINIC_OR_DEPARTMENT_OTHER): Payer: Medicare Other | Admitting: Internal Medicine

## 2014-07-06 ENCOUNTER — Other Ambulatory Visit (HOSPITAL_BASED_OUTPATIENT_CLINIC_OR_DEPARTMENT_OTHER): Payer: Medicare Other

## 2014-07-06 ENCOUNTER — Telehealth: Payer: Self-pay | Admitting: Internal Medicine

## 2014-07-06 ENCOUNTER — Encounter: Payer: Self-pay | Admitting: Internal Medicine

## 2014-07-06 ENCOUNTER — Ambulatory Visit: Payer: Medicare Other

## 2014-07-06 VITALS — BP 154/66 | HR 83 | Temp 97.7°F | Resp 20 | Ht 65.0 in | Wt 129.8 lb

## 2014-07-06 DIAGNOSIS — C3410 Malignant neoplasm of upper lobe, unspecified bronchus or lung: Secondary | ICD-10-CM

## 2014-07-06 DIAGNOSIS — Z23 Encounter for immunization: Secondary | ICD-10-CM

## 2014-07-06 DIAGNOSIS — R350 Frequency of micturition: Secondary | ICD-10-CM

## 2014-07-06 DIAGNOSIS — R21 Rash and other nonspecific skin eruption: Secondary | ICD-10-CM

## 2014-07-06 DIAGNOSIS — C3490 Malignant neoplasm of unspecified part of unspecified bronchus or lung: Secondary | ICD-10-CM

## 2014-07-06 DIAGNOSIS — R0989 Other specified symptoms and signs involving the circulatory and respiratory systems: Secondary | ICD-10-CM

## 2014-07-06 DIAGNOSIS — R197 Diarrhea, unspecified: Secondary | ICD-10-CM

## 2014-07-06 DIAGNOSIS — E538 Deficiency of other specified B group vitamins: Secondary | ICD-10-CM

## 2014-07-06 DIAGNOSIS — R0609 Other forms of dyspnea: Secondary | ICD-10-CM

## 2014-07-06 LAB — CBC WITH DIFFERENTIAL/PLATELET
BASO%: 0.6 % (ref 0.0–2.0)
Basophils Absolute: 0.1 10*3/uL (ref 0.0–0.1)
EOS%: 2.9 % (ref 0.0–7.0)
Eosinophils Absolute: 0.2 10*3/uL (ref 0.0–0.5)
HCT: 36.2 % (ref 34.8–46.6)
HGB: 11.5 g/dL — ABNORMAL LOW (ref 11.6–15.9)
LYMPH%: 24.4 % (ref 14.0–49.7)
MCH: 28.6 pg (ref 25.1–34.0)
MCHC: 31.8 g/dL (ref 31.5–36.0)
MCV: 89.8 fL (ref 79.5–101.0)
MONO#: 0.6 10*3/uL (ref 0.1–0.9)
MONO%: 7.9 % (ref 0.0–14.0)
NEUT#: 5.2 10*3/uL (ref 1.5–6.5)
NEUT%: 64.2 % (ref 38.4–76.8)
Platelets: 173 10*3/uL (ref 145–400)
RBC: 4.03 10*6/uL (ref 3.70–5.45)
RDW: 15.5 % — ABNORMAL HIGH (ref 11.2–14.5)
WBC: 8.1 10*3/uL (ref 3.9–10.3)
lymph#: 2 10*3/uL (ref 0.9–3.3)

## 2014-07-06 LAB — COMPREHENSIVE METABOLIC PANEL (CC13)
ALK PHOS: 86 U/L (ref 40–150)
ALT: 10 U/L (ref 0–55)
AST: 14 U/L (ref 5–34)
Albumin: 4 g/dL (ref 3.5–5.0)
Anion Gap: 8 mEq/L (ref 3–11)
BILIRUBIN TOTAL: 0.82 mg/dL (ref 0.20–1.20)
BUN: 11.2 mg/dL (ref 7.0–26.0)
CO2: 27 mEq/L (ref 22–29)
Calcium: 10.2 mg/dL (ref 8.4–10.4)
Chloride: 109 mEq/L (ref 98–109)
Creatinine: 1.1 mg/dL (ref 0.6–1.1)
Glucose: 85 mg/dl (ref 70–140)
Potassium: 3.6 mEq/L (ref 3.5–5.1)
SODIUM: 144 meq/L (ref 136–145)
TOTAL PROTEIN: 7.3 g/dL (ref 6.4–8.3)

## 2014-07-06 LAB — URINALYSIS, MICROSCOPIC - CHCC
Bilirubin (Urine): NEGATIVE
Glucose: NEGATIVE mg/dL
Ketones: NEGATIVE mg/dL
Nitrite: NEGATIVE
PH: 7 (ref 4.6–8.0)
PROTEIN: NEGATIVE mg/dL
SPECIFIC GRAVITY, URINE: 1.005 (ref 1.003–1.035)
UROBILINOGEN UR: 0.2 mg/dL (ref 0.2–1)

## 2014-07-06 MED ORDER — CYANOCOBALAMIN 1000 MCG/ML IJ SOLN
1000.0000 ug | Freq: Once | INTRAMUSCULAR | Status: AC
  Administered 2014-07-06: 1000 ug via INTRAMUSCULAR

## 2014-07-06 MED ORDER — SODIUM CHLORIDE 0.9 % IJ SOLN
10.0000 mL | INTRAMUSCULAR | Status: DC | PRN
  Administered 2014-07-06: 10 mL via INTRAVENOUS
  Filled 2014-07-06: qty 10

## 2014-07-06 MED ORDER — HEPARIN SOD (PORK) LOCK FLUSH 100 UNIT/ML IV SOLN
500.0000 [IU] | Freq: Once | INTRAVENOUS | Status: AC
  Administered 2014-07-06: 500 [IU] via INTRAVENOUS
  Filled 2014-07-06: qty 5

## 2014-07-06 MED ORDER — INFLUENZA VAC SPLIT QUAD 0.5 ML IM SUSY
0.5000 mL | PREFILLED_SYRINGE | Freq: Once | INTRAMUSCULAR | Status: AC
  Administered 2014-07-06: 0.5 mL via INTRAMUSCULAR
  Filled 2014-07-06: qty 0.5

## 2014-07-06 NOTE — Telephone Encounter (Signed)
gv and printed appt sched and avs for pt for OCT.Marland Kitchensent pt to lab

## 2014-07-06 NOTE — Progress Notes (Signed)
[facility] [phone]   [phone]  OFFICE PROGRESS NOTE  [name], NP [id number] [place] [address]  DIAGNOSIS AND STAGE:  #1 Recurrent non-small cell lung cancer consistent with adenocarcinoma. This was initially diagnosed as stage IB (T2a N0 M0) well-differentiated adenocarcinoma of bronchioalveolar type in December 2010.  #2 Acute pulmonary embolism in the right lower lobe lobar, segmental and subsegmental sized pulmonary arteries, diagnosed on 11/04/2011.   PRIOR THERAPY:  1.Status post right upper lobectomy on September 28, 2009 under the care of Dr. Arlyce Dice. The patient refused adjuvant chemotherapy at that time. She had disease recurrence in June of 2012.  2.Status post 4 cycles of systemic chemotherapy with carboplatin for an AUC of 6 and paclitaxel at 200 mg per meter squared and Avastin at 15 mg/kg according to the ECOG protocol #5508.  3. Chemotherapy with Avastin 15 mg/kg according to the ECOG protocol #5508 status post 32 cycles. Last cycle was given on 06/30/2013 discontinued secondary to disease progression and persistent proteinuria. 4. Tarceva 150 mg by mouth daily, started 08/09/2013, status post 10 months of treatment.  CURRENT THERAPY: Tarceva 100 mg by mouth daily started 07/02/2014  CHEMOTHERAPY INTENT: Palliative.  CURRENT # OF CHEMOTHERAPY CYCLES: 1 CURRENT ANTIEMETICS: Compazine  CURRENT SMOKING STATUS: Non-smoker  ORAL CHEMOTHERAPY AND CONSENT: Tarceva and consent was signed 07/21/2013.  CURRENT BISPHOSPHONATES USE: None  PAIN MANAGEMENT: No pain  NARCOTICS INDUCED CONSTIPATION: No constipation.  LIVING WILL AND CODE STATUS: Full code   INTERVAL HISTORY: [name] 73 y.o. female returns to the clinic today for followup visit. The patient is feeling fine today except for mild skin rash mainly on the face as well as the growing eyelash. She has been off treatment with Tarceva for the last few  weeks secondary to persistent diarrhea. She continues to have diarrhea especially with movement and she has to change her diaper. This was happening even when the patient was off Tarceva. She is now questioning whether this is related to her treatment with Tarceva. She started your dose of Tarceva 100 mg by mouth daily 3 days ago. She did not notice any increase in the diarrhea or skin rash. She has some urinary frequency over the last few days. The patient also has few episodes of cough. She denied having any significant nausea or vomiting. She denied having any chest pain but continues to have shortness of breath with exertion with no hemoptysis. The patient denied having any fever or chills.   MEDICAL HISTORY: Past Medical History  Diagnosis Date  . Hypertension   . Hypothyroidism   . Hypercholesterolemia   . Glaucoma   . Hip pain 09/02/2011  . Foot pain 09/02/2011  . Lung cancer 03/08/2011    recurrent    ALLERGIES:  is allergic to amitriptyline; codeine; and sulfa antibiotics.  MEDICATIONS:  Current Outpatient Prescriptions  Medication Sig Dispense Refill  . amLODipine (NORVASC) 5 MG tablet Take 10 mg by mouth daily. Per [name], NP      . clindamycin (CLEOCIN T) 1 % lotion Apply topically 2 (two) times daily. Apply twice daily as needed  120 mL  0  . clonazePAM (KLONOPIN) 0.5 MG tablet Take 0.5 mg by mouth 2 (two) times daily as needed. Per [name], NP      . cyanocobalamin (,VITAMIN B-12,) 1000 MCG/ML injection Inject 1,000 mcg into the muscle every 30 (thirty) days.      . diphenoxylate-atropine (LOMOTIL)  2.5-0.025 MG per tablet Take 1 to 2 tablets by mouth every 6 hours as needed for diarrhea  30 tablet  1  . dorzolamide-timolol (COSOPT) 22.3-6.8 MG/ML ophthalmic solution 2 drops 2 (two) times daily. Per [name], NP       . doxycycline (VIBRA-TABS) 100 MG tablet Take 1 tablet (100 mg total) by mouth every 12 (twelve) hours.  10 tablet  0  . [START ON 07/15/2014]  erlotinib (TARCEVA) 100 MG tablet Take 1 tablet (100 mg total) by mouth daily. Take on an empty stomach 1 hour before meals or 2 hours after  30 tablet  0  . escitalopram (LEXAPRO) 10 MG tablet Take 10 mg by mouth daily.       Marland Kitchen levothyroxine (SYNTHROID, LEVOTHROID) 88 MCG tablet Take 88 mcg by mouth daily before breakfast.      . lidocaine-prilocaine (EMLA) cream Apply 1 application topically as needed (prior to port access).       Marland Kitchen lisinopril (PRINIVIL,ZESTRIL) 40 MG tablet Take 40 mg by mouth daily. Per [name], NP       . loperamide (IMODIUM) 2 MG capsule Take 1 capsule (2 mg total) by mouth every 4 (four) hours as needed for diarrhea or loose stools.  30 capsule  0  . ondansetron (ZOFRAN) 4 MG tablet Take 1 tablet (4 mg total) by mouth every 8 (eight) hours as needed for nausea or vomiting.  20 tablet  0  . pravastatin (PRAVACHOL) 80 MG tablet Take 80 mg by mouth daily.       . prochlorperazine (COMPAZINE) 10 MG tablet Take 10 mg by mouth every 6 (six) hours as needed (for nausea).        No current facility-administered medications for this visit.    SURGICAL HISTORY:  Past Surgical History  Procedure Laterality Date  . Video assisted thoracoscopy  09/28/2009  . Thoracotomy  09/28/2009    mini  . Lung lobectomy  09/28/2009    right upper    REVIEW OF SYSTEMS:  Constitutional: positive for fatigue Eyes: negative Ears, nose, mouth, throat, and face: negative Respiratory: negative Cardiovascular: negative Gastrointestinal: Intermittent diarrhea Genitourinary: Increased urine frequency Integument/breast: Acne-like skin rash on the face especially the nose area. Hematologic/lymphatic: negative Musculoskeletal:negative Neurological: positive for paresthesia Behavioral/Psych: negative Endocrine: negative Allergic/Immunologic: negative   PHYSICAL EXAMINATION: General appearance: alert, cooperative, fatigued and no distress Head: Normocephalic, without obvious abnormality,  atraumatic Neck: no adenopathy, no JVD, supple, symmetrical, trachea midline and thyroid not enlarged, symmetric, no tenderness/mass/nodules Lymph nodes: Cervical, supraclavicular, and axillary nodes normal. Resp: clear to auscultation bilaterally Back: symmetric, no curvature. ROM normal. No CVA tenderness. Cardio: regular rate and rhythm, S1, S2 normal, no murmur, click, rub or gallop GI: soft, non-tender; bowel sounds normal; no masses,  no organomegaly Extremities: extremities normal, atraumatic, no cyanosis or edema Neurologic: Alert and oriented X 3, normal strength and tone. Normal symmetric reflexes. Normal coordination and gait  ECOG PERFORMANCE STATUS: 1 - Symptomatic but completely ambulatory  Blood pressure 154/66, pulse 83, temperature 97.7 F (36.5 C), temperature source Oral, resp. rate 20, height 5\' 5"  (1.651 m), weight 129 lb 12.8 oz (58.877 kg).  LABORATORY DATA: Lab Results  Component Value Date   WBC 8.1 07/06/2014   HGB 11.5* 07/06/2014   HCT 36.2 07/06/2014   MCV 89.8 07/06/2014   PLT 173 07/06/2014      Chemistry      Component Value Date/Time   NA 144 05/25/2014 0332   NA 142  04/28/2014 1052   NA 142 03/08/2011 0754   K 3.7 05/25/2014 0332   K 3.8 04/28/2014 1052   K 4.2 03/08/2011 0754   CL 109 05/25/2014 0332   CL 107 03/30/2013 1036   CL 102 03/08/2011 0754   CO2 25 05/25/2014 0332   CO2 26 04/28/2014 1052   CO2 27 03/08/2011 0754   BUN 5* 05/25/2014 0332   BUN 14.1 04/28/2014 1052   BUN 9 03/08/2011 0754   CREATININE 1.15* 05/25/2014 0332   CREATININE 1.2* 04/28/2014 1052   CREATININE 1.0 03/08/2011 0754      Component Value Date/Time   CALCIUM 9.2 05/25/2014 0332   CALCIUM 9.7 04/28/2014 1052   CALCIUM 9.5 03/08/2011 0754   ALKPHOS 67 05/25/2014 0332   ALKPHOS 82 04/28/2014 1052   ALKPHOS 59 03/08/2011 0754   AST 17 05/25/2014 0332   AST 13 04/28/2014 1052   AST 19 03/08/2011 0754   ALT 9 05/25/2014 0332   ALT 12 04/28/2014 1052   ALT 18 03/08/2011 0754   BILITOT  0.4 05/25/2014 0332   BILITOT 1.11 04/28/2014 1052   BILITOT 0.50 03/08/2011 0754     RADIOLOGY RESULTS:  ASSESSMENT AND PLAN: This is a very pleasant 73 years old [place] American female with recurrent non-small cell lung cancer most recently treated with maintenance Avastin status post 32 cycles. She is currently on treatment with single agent Tarceva 150 mg by mouth daily status post 10 months and tolerating her treatment fairly well except for mild skin rash and occasional diarrhea. Because of the intermittent diarrhea, height change in her dose of Tarceva at 100 mg by mouth daily and the patient started his treatments 3 days ago. I recommended for her to take one Imodium daily in the morning to help with her unexpected diarrhea and with no improvement I may consider referring her to gastroenterology for evaluation. For the skin rash, she will continue to apply the clindamycin 1% lotion to the skin rash area twice a day. For the frequency, I will check urinalysis today. The patient will receive vitamin B12 injection today. She will come back for followup visit in one month's for reevaluation with repeat blood work. The patient was advised to call immediately if she has any concerning symptoms in the interval. The patient voices understanding of current disease status and treatment options and is in agreement with the current care plan.  All questions were answered. The patient knows to call the clinic with any problems, questions or concerns. We can certainly see the patient much sooner if necessary.  Disclaimer: This note was dictated with voice recognition software. Similar sounding words can inadvertently be transcribed and may not be corrected upon review.

## 2014-07-07 LAB — URINE CULTURE

## 2014-07-25 ENCOUNTER — Other Ambulatory Visit: Payer: Self-pay | Admitting: *Deleted

## 2014-07-25 DIAGNOSIS — C3490 Malignant neoplasm of unspecified part of unspecified bronchus or lung: Secondary | ICD-10-CM

## 2014-07-25 MED ORDER — ERLOTINIB HCL 100 MG PO TABS: 100.0000 mg | ORAL_TABLET | Freq: Every day | ORAL | Status: DC

## 2014-08-03 ENCOUNTER — Ambulatory Visit: Payer: Medicare Other

## 2014-08-03 ENCOUNTER — Telehealth: Payer: Self-pay | Admitting: Internal Medicine

## 2014-08-03 ENCOUNTER — Encounter: Payer: Self-pay | Admitting: Physician Assistant

## 2014-08-03 ENCOUNTER — Ambulatory Visit (HOSPITAL_BASED_OUTPATIENT_CLINIC_OR_DEPARTMENT_OTHER): Payer: Medicare Other

## 2014-08-03 ENCOUNTER — Other Ambulatory Visit (HOSPITAL_BASED_OUTPATIENT_CLINIC_OR_DEPARTMENT_OTHER): Payer: Medicare Other

## 2014-08-03 ENCOUNTER — Ambulatory Visit (HOSPITAL_BASED_OUTPATIENT_CLINIC_OR_DEPARTMENT_OTHER): Payer: Medicare Other | Admitting: Physician Assistant

## 2014-08-03 VITALS — BP 141/61 | HR 82 | Temp 97.1°F | Resp 20 | Ht 65.0 in | Wt 134.2 lb

## 2014-08-03 DIAGNOSIS — Z95828 Presence of other vascular implants and grafts: Secondary | ICD-10-CM

## 2014-08-03 DIAGNOSIS — R21 Rash and other nonspecific skin eruption: Secondary | ICD-10-CM | POA: Diagnosis not present

## 2014-08-03 DIAGNOSIS — R197 Diarrhea, unspecified: Secondary | ICD-10-CM

## 2014-08-03 DIAGNOSIS — C3411 Malignant neoplasm of upper lobe, right bronchus or lung: Secondary | ICD-10-CM

## 2014-08-03 DIAGNOSIS — Z23 Encounter for immunization: Secondary | ICD-10-CM

## 2014-08-03 DIAGNOSIS — G629 Polyneuropathy, unspecified: Secondary | ICD-10-CM | POA: Diagnosis not present

## 2014-08-03 DIAGNOSIS — F329 Major depressive disorder, single episode, unspecified: Secondary | ICD-10-CM

## 2014-08-03 DIAGNOSIS — E538 Deficiency of other specified B group vitamins: Secondary | ICD-10-CM

## 2014-08-03 DIAGNOSIS — R350 Frequency of micturition: Secondary | ICD-10-CM

## 2014-08-03 LAB — CBC WITH DIFFERENTIAL/PLATELET
BASO%: 0.4 % (ref 0.0–2.0)
BASOS ABS: 0 10*3/uL (ref 0.0–0.1)
EOS%: 2.2 % (ref 0.0–7.0)
Eosinophils Absolute: 0.2 10*3/uL (ref 0.0–0.5)
HEMATOCRIT: 36.1 % (ref 34.8–46.6)
HGB: 11.7 g/dL (ref 11.6–15.9)
LYMPH#: 1.9 10*3/uL (ref 0.9–3.3)
LYMPH%: 23.1 % (ref 14.0–49.7)
MCH: 28.5 pg (ref 25.1–34.0)
MCHC: 32.4 g/dL (ref 31.5–36.0)
MCV: 88 fL (ref 79.5–101.0)
MONO#: 0.6 10*3/uL (ref 0.1–0.9)
MONO%: 7.2 % (ref 0.0–14.0)
NEUT%: 67.1 % (ref 38.4–76.8)
NEUTROS ABS: 5.4 10*3/uL (ref 1.5–6.5)
Platelets: 173 10*3/uL (ref 145–400)
RBC: 4.1 10*6/uL (ref 3.70–5.45)
RDW: 14.9 % — AB (ref 11.2–14.5)
WBC: 8.1 10*3/uL (ref 3.9–10.3)

## 2014-08-03 LAB — COMPREHENSIVE METABOLIC PANEL (CC13)
ALT: 15 U/L (ref 0–55)
ANION GAP: 7 meq/L (ref 3–11)
AST: 15 U/L (ref 5–34)
Albumin: 3.8 g/dL (ref 3.5–5.0)
Alkaline Phosphatase: 85 U/L (ref 40–150)
BUN: 11.5 mg/dL (ref 7.0–26.0)
CALCIUM: 9.9 mg/dL (ref 8.4–10.4)
CO2: 25 meq/L (ref 22–29)
CREATININE: 1.1 mg/dL (ref 0.6–1.1)
Chloride: 110 mEq/L — ABNORMAL HIGH (ref 98–109)
Glucose: 87 mg/dl (ref 70–140)
POTASSIUM: 3.7 meq/L (ref 3.5–5.1)
Sodium: 142 mEq/L (ref 136–145)
Total Bilirubin: 0.62 mg/dL (ref 0.20–1.20)
Total Protein: 6.8 g/dL (ref 6.4–8.3)

## 2014-08-03 MED ORDER — CYANOCOBALAMIN 1000 MCG/ML IJ SOLN
1000.0000 ug | Freq: Once | INTRAMUSCULAR | Status: AC
  Administered 2014-08-03: 1000 ug via INTRAMUSCULAR

## 2014-08-03 MED ORDER — DULOXETINE HCL 30 MG PO CPEP: 30.0000 mg | ORAL_CAPSULE | Freq: Two times a day (BID) | ORAL | Status: DC

## 2014-08-03 MED ORDER — HEPARIN SOD (PORK) LOCK FLUSH 100 UNIT/ML IV SOLN
500.0000 [IU] | Freq: Once | INTRAVENOUS | Status: AC
  Administered 2014-08-03: 500 [IU] via INTRAVENOUS
  Filled 2014-08-03: qty 5

## 2014-08-03 MED ORDER — SODIUM CHLORIDE 0.9 % IJ SOLN
10.0000 mL | INTRAMUSCULAR | Status: DC | PRN
  Administered 2014-08-03: 10 mL via INTRAVENOUS
  Filled 2014-08-03: qty 10

## 2014-08-03 NOTE — Telephone Encounter (Signed)
gv and printed appt sched and avs for pt for NOV °

## 2014-08-03 NOTE — Patient Instructions (Signed)
Implanted Port Home Guide An implanted port is a type of central line that is placed under the skin. Central lines are used to provide IV access when treatment or nutrition needs to be given through a person's veins. Implanted ports are used for long-term IV access. An implanted port may be placed because:   You need IV medicine that would be irritating to the small veins in your hands or arms.   You need long-term IV medicines, such as antibiotics.   You need IV nutrition for a long period.   You need frequent blood draws for lab tests.   You need dialysis.  Implanted ports are usually placed in the chest area, but they can also be placed in the upper arm, the abdomen, or the leg. An implanted port has two main parts:   Reservoir. The reservoir is round and will appear as a small, raised area under your skin. The reservoir is the part where a needle is inserted to give medicines or draw blood.   Catheter. The catheter is a thin, flexible tube that extends from the reservoir. The catheter is placed into a large vein. Medicine that is inserted into the reservoir goes into the catheter and then into the vein.  HOW WILL I CARE FOR MY INCISION SITE? Do not get the incision site wet. Bathe or shower as directed by your health care provider.  HOW IS MY PORT ACCESSED? Special steps must be taken to access the port:   Before the port is accessed, a numbing cream can be placed on the skin. This helps numb the skin over the port site.   Your health care provider uses a sterile technique to access the port.  Your health care provider must put on a mask and sterile gloves.  The skin over your port is cleaned carefully with an antiseptic and allowed to dry.  The port is gently pinched between sterile gloves, and a needle is inserted into the port.  Only "non-coring" port needles should be used to access the port. Once the port is accessed, a blood return should be checked. This helps  ensure that the port is in the vein and is not clogged.   If your port needs to remain accessed for a constant infusion, a clear (transparent) bandage will be placed over the needle site. The bandage and needle will need to be changed every week, or as directed by your health care provider.   Keep the bandage covering the needle clean and dry. Do not get it wet. Follow your health care provider's instructions on how to take a shower or bath while the port is accessed.   If your port does not need to stay accessed, no bandage is needed over the port.  WHAT IS FLUSHING? Flushing helps keep the port from getting clogged. Follow your health care provider's instructions on how and when to flush the port. Ports are usually flushed with saline solution or a medicine called heparin. The need for flushing will depend on how the port is used.   If the port is used for intermittent medicines or blood draws, the port will need to be flushed:   After medicines have been given.   After blood has been drawn.   As part of routine maintenance.   If a constant infusion is running, the port may not need to be flushed.  HOW LONG WILL MY PORT STAY IMPLANTED? The port can stay in for as long as your health care   provider thinks it is needed. When it is time for the port to come out, surgery will be done to remove it. The procedure is similar to the one performed when the port was put in.  WHEN SHOULD I SEEK IMMEDIATE MEDICAL CARE? When you have an implanted port, you should seek immediate medical care if:   You notice a bad smell coming from the incision site.   You have swelling, redness, or drainage at the incision site.   You have more swelling or pain at the port site or the surrounding area.   You have a fever that is not controlled with medicine. Document Released: 09/30/2005 Document Revised: 07/21/2013 Document Reviewed: 06/07/2013 [facility] Patient Information 2015 [place], [place]. This  information is not intended to replace advice given to you by your health care provider. Make sure you discuss any questions you have with your health care provider.    Vitamin B12 Injections Every person needs vitamin B12. A deficiency develops when the body does not get enough of it. One way to overcome this is by getting B12 shots (injections). A B12 shot puts the vitamin directly into muscle tissue. This avoids any problems your body might have in absorbing it from food or a pill. In some people, the body has trouble using the vitamin correctly. This can cause a B12 deficiency. Not consuming enough of the vitamin can also cause a deficiency. Getting enough vitamin B12 can be hard for elderly people. Sometimes, they do not eat a well-balanced diet. The elderly are also more likely than younger people to have medical conditions or take medications that can lead to a deficiency. WHAT DOES VITAMIN B12 DO? Vitamin B12 does many things to help the body work right:  It helps the body make healthy red blood cells.  It helps maintain nerve cells.  It is involved in the body's process of converting food into energy (metabolism).  It is needed to make the genetic material in all cells (DNA). VITAMIN B12 FOOD SOURCES Most people get plenty of vitamin B12 through the foods they eat. It is present in:  Meat, fish, poultry, and eggs.  Milk and milk products.  It also is added when certain foods are made, including some breads, cereals and yogurts. The food is then called "fortified". CAUSES The most common causes of vitamin B12 deficiency are:  Pernicious anemia. The condition develops when the body cannot make enough healthy red blood cells. This stems from a lack of a protein made in the stomach (intrinsic factor). People without this protein cannot absorb enough vitamin B12 from food.  Malabsorption. This is when the body cannot absorb the vitamin. It can be caused by:  Pernicious  anemia.  Surgery to remove part or all of the stomach can lead to malabsorption. Removal of part or all of the small intestine can also cause malabsorption.  Vegetarian diet. People who are strict about not eating foods from animals could have trouble taking in enough vitamin B12 from diet alone.  Medications. Some medicines have been linked to B12 deficiency, such as Metformin (a drug prescribed for type 2 diabetes). Long-term use of stomach acid suppressants also can keep the vitamin from being absorbed.  Intestinal problems such as inflammatory bowel disease. If there are problems in the digestive tract, vitamin B12 may not be absorbed in good enough amounts. SYMPTOMS People who do not get enough B12 can develop problems. These can include:  Anemia. This is when the body has too few  red blood cells. Red blood cells carry oxygen to the rest of the body. Without a healthy supply of red blood cells, people can feel:  Tired (fatigued).  Weak.  Severe anemia can cause:  Shortness of breath.  Dizziness.  Rapid heart rate.  Paleness.  Other Vitamin B12 deficiency symptoms include:  Diarrhea.  Numbness or tingling in the hands or feet.  Loss of appetite.  Confusion.  Sores on the tongue or in the mouth. LET YOUR CAREGIVER KNOW ABOUT:  Any allergies. It is very important to know if you are allergic or sensitive to cobalt. Vitamin B12 contains cobalt.  Any history of kidney disease.  All medications you are taking. Include prescription and over-the-counter medicines, herbs and creams.  Whether you are pregnant or breast-feeding.  If you have Leber's disease, a hereditary eye condition, vitamin B12 could make it worse. RISKS AND COMPLICATIONS Reactions to an injection are usually temporary. They might include:  Pain at the injection site.  Redness, swelling or tenderness at the site.  Headache, dizziness or weakness.  Nausea, upset stomach or diarrhea.  Numbness  or tingling.  Fever.  Joint pain.  Itching or rash. If a reaction does not go away in a short while, talk with your healthcare provider. A change in the way the shots are given, or where they are given, might need to be made. BEFORE AN INJECTION To decide whether B12 injections are right for you, your healthcare provider will probably:  Ask about your medical history.  Ask questions about your diet.  Ask about symptoms such as:  Have you felt weak?  Do you feel unusually tired?  Do you get dizzy?  Order blood tests. These may include a test to:  Check the level of red cells in your blood.  Measure B12 levels.  Check for the presence of intrinsic factor. VITAMIN B12 INJECTIONS How often you will need a vitamin B12 injection will depend on how severe your deficiency is. This also will affect how long you will need to get them. People with pernicious anemia usually get injections for their entire life. Others might get them for a shorter period. For many people, injections are given daily or weekly for several weeks. Then, once B12 levels are normal, injections are given just once a month. If the cause of the deficiency can be fixed, the injections can be stopped. Talk with your healthcare provider about what you should expect. For an injection:  The injection site will be cleaned with an alcohol swab.  Your healthcare provider will insert a needle directly into a muscle. Most any muscle can be used. Most often, an arm muscle is used. A buttocks muscle can also be used. Many people say shots in that area are less painful.  A small adhesive bandage may be put over the injection site. It usually can be taken off in an hour or less. Injections can be given by your healthcare provider. In some cases, family members give them. Sometimes, people give them to themselves. Talk with your healthcare provider about what would be best for you. If someone other than your healthcare provider  will be giving the shots, the person will need to be trained to give them correctly. HOME CARE INSTRUCTIONS   You can remove the adhesive bandage within an hour of getting a shot.  You should be able to go about your normal activities right away.  Avoid drinking large amounts of alcohol while taking vitamin B12 shots. Alcohol can  interfere with the body's use of the vitamin. SEEK MEDICAL CARE IF:   Pain, redness, swelling or tenderness at the injection site does not get better or gets worse.  Headache, dizziness or weakness does not go away.  You develop a fever of more than 100.5 F (38.1 C). SEEK IMMEDIATE MEDICAL CARE IF:   You have chest pain.  You develop shortness of breath.  You have muscle weakness that gets worse.  You develop numbness, weakness or tingling on one side or one area of the body.  You have symptoms of an allergic reaction, such as:  Hives.  Difficulty breathing.  Swelling of the lips, face, tongue or throat.  You develop a fever of more than 102.0 F (38.9 C). MAKE SURE YOU:   Understand these instructions.  Will watch your condition.  Will get help right away if you are not doing well or get worse. Document Released: 12/27/2008 Document Revised: 12/23/2011 Document Reviewed: 12/27/2008 [facility] Patient Information 2015 [place], [place]. This information is not intended to replace advice given to you by your health care provider. Make sure you discuss any questions you have with your health care provider.

## 2014-08-03 NOTE — Progress Notes (Signed)
Vitamin B12 injection given in Flush Room. Patient tolerated injection well.

## 2014-08-03 NOTE — Progress Notes (Signed)
[place] Telephone:(336) [phone]   Fax:([phone]  OFFICE PROGRESS NOTE  [name], NP [phone] [address] [place] [address]  DIAGNOSIS AND STAGE:  #1 Recurrent non-small cell lung cancer consistent with adenocarcinoma. This was initially diagnosed as stage IB (T2a N0 M0) well-differentiated adenocarcinoma of bronchioalveolar type in December 2010.  #2 Acute pulmonary embolism in the right lower lobe lobar, segmental and subsegmental sized pulmonary arteries, diagnosed on 11/04/2011.   PRIOR THERAPY:  1.Status post right upper lobectomy on September 28, 2009 under the care of Dr. Arlyce Dice. The patient refused adjuvant chemotherapy at that time. She had disease recurrence in June of 2012.  2.Status post 4 cycles of systemic chemotherapy with carboplatin for an AUC of 6 and paclitaxel at 200 mg per meter squared and Avastin at 15 mg/kg according to the ECOG protocol #5508.  3. Chemotherapy with Avastin 15 mg/kg according to the ECOG protocol #5508 status post 32 cycles. Last cycle was given on 06/30/2013 discontinued secondary to disease progression and persistent proteinuria. 4. Tarceva 150 mg by mouth daily, started 08/09/2013, status post 10 months of treatment.  CURRENT THERAPY: Tarceva 100 mg by mouth daily started 07/02/2014  CHEMOTHERAPY INTENT: Palliative.  CURRENT # OF CHEMOTHERAPY CYCLES: 1 CURRENT ANTIEMETICS: Compazine  CURRENT SMOKING STATUS: Non-smoker  ORAL CHEMOTHERAPY AND CONSENT: Tarceva and consent was signed 07/21/2013.  CURRENT BISPHOSPHONATES USE: None  PAIN MANAGEMENT: No pain  NARCOTICS INDUCED CONSTIPATION: No constipation.  LIVING WILL AND CODE STATUS: Full code   INTERVAL HISTORY: [name] 73 y.o. female returns to the clinic today for followup visit. The patient is feeling fine today except for mild skin rash mainly on the face as well as the overgrowth of the eyelashes. She is tolerating the Tarceva 100 mg dose with  fewer episodes of diarrhea. She continues to have diarrhea especially with movement and she has to change her diaper. This was happening even when the patient was off Tarceva. She is now questioning whether this is related to her treatment with Tarceva.  She did not notice any increase in the skin rash. 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 The patient also has few episodes of cough. She denied having any significant nausea or vomiting. She denied having any chest pain but continues to have shortness of breath with exertion with no hemoptysis. The patient denied having any fever or chills.   MEDICAL HISTORY: Past Medical History  Diagnosis Date  . Hypertension   . Hypothyroidism   . Hypercholesterolemia   . Glaucoma   . Hip pain 09/02/2011  . Foot pain 09/02/2011  . Lung cancer 03/08/2011    recurrent    ALLERGIES:  is allergic to amitriptyline; codeine; and sulfa antibiotics.  MEDICATIONS:  Current Outpatient Prescriptions  Medication Sig Dispense Refill  . amLODipine (NORVASC) 5 MG tablet Take 10 mg by mouth daily. Per [name], NP      . clindamycin (CLEOCIN T) 1 % lotion Apply topically 2 (two) times daily. Apply twice daily as needed  120 mL  0  . clonazePAM (KLONOPIN) 0.5 MG tablet Take 0.5 mg by mouth 2 (two) times daily as needed. Per [name], NP      . cyanocobalamin (,VITAMIN B-12,) 1000 MCG/ML injection Inject 1,000 mcg into the muscle every 30 (thirty) days.      . diphenoxylate-atropine (LOMOTIL) 2.5-0.025 MG per tablet Take 1 to 2 tablets by mouth  every 6 hours as needed for diarrhea  30 tablet  1  . dorzolamide-timolol (COSOPT) 22.3-6.8 MG/ML ophthalmic solution 2 drops 2 (two) times daily. Per [name], NP       . doxycycline (VIBRA-TABS) 100 MG tablet Take 1 tablet (100 mg total) by mouth every 12 (twelve) hours.  10 tablet  0  . erlotinib (TARCEVA) 100 MG tablet Take 1 tablet (100  mg total) by mouth daily. Take on an empty stomach 1 hour before meals or 2 hours after  30 tablet  0  . levothyroxine (SYNTHROID, LEVOTHROID) 88 MCG tablet Take 88 mcg by mouth daily before breakfast.      . lidocaine-prilocaine (EMLA) cream Apply 1 application topically as needed (prior to port access).       Marland Kitchen lisinopril (PRINIVIL,ZESTRIL) 40 MG tablet Take 40 mg by mouth daily. Per [name], NP       . loperamide (IMODIUM) 2 MG capsule Take 1 capsule (2 mg total) by mouth every 4 (four) hours as needed for diarrhea or loose stools.  30 capsule  0  . ondansetron (ZOFRAN) 4 MG tablet Take 1 tablet (4 mg total) by mouth every 8 (eight) hours as needed for nausea or vomiting.  20 tablet  0  . pravastatin (PRAVACHOL) 80 MG tablet Take 80 mg by mouth daily.       . prochlorperazine (COMPAZINE) 10 MG tablet Take 10 mg by mouth every 6 (six) hours as needed (for nausea).       . DULoxetine (CYMBALTA) 30 MG capsule Take 1 capsule (30 mg total) by mouth 2 (two) times daily.  60 capsule  3   No current facility-administered medications for this visit.    SURGICAL HISTORY:  Past Surgical History  Procedure Laterality Date  . Video assisted thoracoscopy  09/28/2009  . Thoracotomy  09/28/2009    mini  . Lung lobectomy  09/28/2009    right upper    REVIEW OF SYSTEMS:  Constitutional: positive for fatigue Eyes: negative Ears, nose, mouth, throat, and face: negative Respiratory: negative Cardiovascular: negative Gastrointestinal: Intermittent diarrhea Genitourinary: Increased urine frequency Integument/breast: Acne-like skin rash on the face especially the nose area. Hematologic/lymphatic: negative Musculoskeletal:negative Neurological: positive for paresthesia Behavioral/Psych: negative Endocrine: negative Allergic/Immunologic: negative   PHYSICAL EXAMINATION: General appearance: alert, cooperative, fatigued and no distress Head: Normocephalic, without obvious abnormality,  atraumatic Neck: no adenopathy, no JVD, supple, symmetrical, trachea midline and thyroid not enlarged, symmetric, no tenderness/mass/nodules Lymph nodes: Cervical, supraclavicular, and axillary nodes normal. Resp: clear to auscultation bilaterally Back: symmetric, no curvature. ROM normal. No CVA tenderness. Cardio: regular rate and rhythm, S1, S2 normal, no murmur, click, rub or gallop GI: soft, non-tender; bowel sounds normal; no masses,  no organomegaly Extremities: extremities normal, atraumatic, no cyanosis or edema Neurologic: Alert and oriented X 3, normal strength and tone. Normal symmetric reflexes. Normal coordination and gait  ECOG PERFORMANCE STATUS: 1 - Symptomatic but completely ambulatory  Blood pressure 141/61, pulse 82, temperature 97.1 F (36.2 C), temperature source Oral, resp. rate 20, height 5\' 5"  (1.651 m), weight 134 lb 3.2 oz (60.873 kg).  LABORATORY DATA: Lab Results  Component Value Date   WBC 8.1 08/03/2014   HGB 11.7 08/03/2014   HCT 36.1 08/03/2014   MCV 88.0 08/03/2014   PLT 173 08/03/2014      Chemistry      Component Value Date/Time   NA 142 08/03/2014 0951   NA 144 05/25/2014 0332   NA 142 03/08/2011 0754  K 3.7 08/03/2014 0951   K 3.7 05/25/2014 0332   K 4.2 03/08/2011 0754   CL 109 05/25/2014 0332   CL 107 03/30/2013 1036   CL 102 03/08/2011 0754   CO2 25 08/03/2014 0951   CO2 25 05/25/2014 0332   CO2 27 03/08/2011 0754   BUN 11.5 08/03/2014 0951   BUN 5* 05/25/2014 0332   BUN 9 03/08/2011 0754   CREATININE 1.1 08/03/2014 0951   CREATININE 1.15* 05/25/2014 0332   CREATININE 1.0 03/08/2011 0754      Component Value Date/Time   CALCIUM 9.9 08/03/2014 0951   CALCIUM 9.2 05/25/2014 0332   CALCIUM 9.5 03/08/2011 0754   ALKPHOS 85 08/03/2014 0951   ALKPHOS 67 05/25/2014 0332   ALKPHOS 59 03/08/2011 0754   AST 15 08/03/2014 0951   AST 17 05/25/2014 0332   AST 19 03/08/2011 0754   ALT 15 08/03/2014 0951   ALT 9 05/25/2014 0332   ALT 18 03/08/2011 0754    BILITOT 0.62 08/03/2014 0951   BILITOT 0.4 05/25/2014 0332   BILITOT 0.50 03/08/2011 0754     RADIOLOGY RESULTS:  ASSESSMENT AND PLAN: This is a very pleasant 73 years old African American female with recurrent non-small cell lung cancer most recently treated with maintenance Avastin status post 32 cycles. She is status post 10 months of Tarceva 150 mg by mouth daily. This was changed to Tarceva 100 mg by mouth daily last month because of the intermittent diarrhea. height change in her dose of Tarceva at 100 mg by mouth daily. She continues to take one Imodium in the mornings to help with her at expected diarrhea. She may require referral to gastroenterology for evaluation for other causes for her diarrhea. Impression she has been on Lexapro. We will change her to Cymbalta which will treat both the depression and address her peripheral neuropathy symptoms. She been placed on Cymbalta 30 milligrams by mouth twice daily.  For the skin rash, she will continue to apply the clindamycin 1% lotion to the skin rash area twice a day. The patient will receive vitamin B12 injection today. She will followup in one month for another symptom management visit as well as her monthly B12 injection.  The patient was advised to call immediately if she has any concerning symptoms in the interval. The patient voices understanding of current disease status and treatment options and is in agreement with the current care plan.  All questions were answered. The patient knows to call the clinic with any problems, questions or concerns. We can certainly see the patient much sooner if necessary.  Carlton Adam, PA-C   Disclaimer: This note was dictated with voice recognition software. Similar sounding words can inadvertently be transcribed and may not be corrected upon review.

## 2014-08-04 ENCOUNTER — Telehealth: Payer: Self-pay | Admitting: *Deleted

## 2014-08-04 ENCOUNTER — Encounter: Payer: Self-pay | Admitting: Internal Medicine

## 2014-08-04 NOTE — Telephone Encounter (Signed)
SPOKE WITH PT. CONCERNING THE MIX UP WITH HER PHARMACY. SHE WOULD LIKE TO TALK WITH DR.MOHAMED CONCERNING POSSIBLE INTERACTIONS BETWEEN THE CYMBALTA AND HER OTHER MEDICATIONS BEFORE THE PRESCRIPTION IS CALLED TO [facility] ON [address]. ALSO PT. HAS A QUESTION CONCERNING A FINANCIAL FORM SHE RECEIVED. WILL HAVE FINANCIAL ASSISTANCE CONTACT PT. CONCERNING FORM. WILL NOTIFY DR.MOHAMED'S NURSE, STEPHANIE BURNS,RN, THAT PT. HAS MEDICATION CONCERNS.

## 2014-08-04 NOTE — Progress Notes (Signed)
Spoke w/ pt regarding questions she had about [employer].  She received an approval letter from the foundation to help with her prescriptions and they are requesting addl't info from her. She wasn't sure about sending in her personal information because she didn't apply to the foundation.  I assured her that the foundation is legit and the pharmacy probably applied in her behalf.  Advised her to call the foundation if she wasn't sure.

## 2014-08-04 NOTE — Telephone Encounter (Signed)
Pt called wanting to make sure that cymbalta was okay to take with all of the other medications she takes at home.  Per Dr Vista Mink okay for patient to take cymbalta, medication list reviewed.  Pt verbalized understanding.

## 2014-08-07 NOTE — Patient Instructions (Signed)
Discontinued taking the Lexapro. He continues the Cymbalta 30 mg by mouth twice daily Continued Tarceva 100 mg by mouth daily Followup in one month

## 2014-08-08 ENCOUNTER — Other Ambulatory Visit: Payer: Self-pay | Admitting: Medical Oncology

## 2014-08-08 ENCOUNTER — Telehealth: Payer: Self-pay | Admitting: Medical Oncology

## 2014-08-08 MED ORDER — DULOXETINE HCL 30 MG PO CPEP: 30.0000 mg | ORAL_CAPSULE | Freq: Two times a day (BID) | ORAL | Status: DC

## 2014-08-08 NOTE — Telephone Encounter (Signed)
I returned pts call. She requests Cymbalta be sent to local pharmacy . Rx initially sent to [place].

## 2014-08-08 NOTE — Telephone Encounter (Signed)
Pt ntoified

## 2014-08-10 ENCOUNTER — Other Ambulatory Visit: Payer: Self-pay | Admitting: Internal Medicine

## 2014-08-25 ENCOUNTER — Encounter (HOSPITAL_COMMUNITY): Payer: Self-pay

## 2014-08-25 ENCOUNTER — Ambulatory Visit (HOSPITAL_COMMUNITY)
Admission: RE | Admit: 2014-08-25 | Discharge: 2014-08-25 | Disposition: A | Discharge status: Home / Self Care | Payer: Medicare Other | Source: Ambulatory Visit | Attending: Diagnostic Radiology | Admitting: Diagnostic Radiology

## 2014-08-25 DIAGNOSIS — C3411 Malignant neoplasm of upper lobe, right bronchus or lung: Secondary | ICD-10-CM | POA: Diagnosis present

## 2014-08-25 MED ORDER — IOHEXOL 300 MG/ML  SOLN
80.0000 mL | Freq: Once | INTRAMUSCULAR | Status: AC | PRN
  Administered 2014-08-25: 80 mL via INTRAVENOUS

## 2014-08-31 ENCOUNTER — Other Ambulatory Visit (HOSPITAL_BASED_OUTPATIENT_CLINIC_OR_DEPARTMENT_OTHER): Payer: Medicare Other

## 2014-08-31 ENCOUNTER — Ambulatory Visit (HOSPITAL_BASED_OUTPATIENT_CLINIC_OR_DEPARTMENT_OTHER): Payer: Medicare Other

## 2014-08-31 ENCOUNTER — Ambulatory Visit (HOSPITAL_BASED_OUTPATIENT_CLINIC_OR_DEPARTMENT_OTHER): Payer: Medicare Other | Admitting: Internal Medicine

## 2014-08-31 ENCOUNTER — Telehealth: Payer: Self-pay | Admitting: Internal Medicine

## 2014-08-31 ENCOUNTER — Ambulatory Visit: Payer: Medicare Other

## 2014-08-31 ENCOUNTER — Encounter: Payer: Self-pay | Admitting: Internal Medicine

## 2014-08-31 VITALS — BP 155/72 | HR 77 | Resp 18 | Ht 65.0 in | Wt 135.7 lb

## 2014-08-31 DIAGNOSIS — E538 Deficiency of other specified B group vitamins: Secondary | ICD-10-CM

## 2014-08-31 DIAGNOSIS — C3411 Malignant neoplasm of upper lobe, right bronchus or lung: Secondary | ICD-10-CM

## 2014-08-31 DIAGNOSIS — R197 Diarrhea, unspecified: Secondary | ICD-10-CM

## 2014-08-31 DIAGNOSIS — Z95828 Presence of other vascular implants and grafts: Secondary | ICD-10-CM

## 2014-08-31 DIAGNOSIS — C3431 Malignant neoplasm of lower lobe, right bronchus or lung: Secondary | ICD-10-CM

## 2014-08-31 DIAGNOSIS — R21 Rash and other nonspecific skin eruption: Secondary | ICD-10-CM

## 2014-08-31 LAB — COMPREHENSIVE METABOLIC PANEL (CC13)
ALBUMIN: 3.7 g/dL (ref 3.5–5.0)
ALK PHOS: 97 U/L (ref 40–150)
ALT: 10 U/L (ref 0–55)
AST: 12 U/L (ref 5–34)
Anion Gap: 9 mEq/L (ref 3–11)
BUN: 12.9 mg/dL (ref 7.0–26.0)
CALCIUM: 9.9 mg/dL (ref 8.4–10.4)
CHLORIDE: 109 meq/L (ref 98–109)
CO2: 24 mEq/L (ref 22–29)
Creatinine: 1.2 mg/dL — ABNORMAL HIGH (ref 0.6–1.1)
Glucose: 89 mg/dl (ref 70–140)
POTASSIUM: 3.7 meq/L (ref 3.5–5.1)
SODIUM: 142 meq/L (ref 136–145)
Total Bilirubin: 0.57 mg/dL (ref 0.20–1.20)
Total Protein: 6.7 g/dL (ref 6.4–8.3)

## 2014-08-31 LAB — CBC WITH DIFFERENTIAL/PLATELET
BASO%: 0.5 % (ref 0.0–2.0)
Basophils Absolute: 0 10*3/uL (ref 0.0–0.1)
EOS%: 2.2 % (ref 0.0–7.0)
Eosinophils Absolute: 0.2 10*3/uL (ref 0.0–0.5)
HCT: 35.5 % (ref 34.8–46.6)
HGB: 11.6 g/dL (ref 11.6–15.9)
LYMPH#: 1.7 10*3/uL (ref 0.9–3.3)
LYMPH%: 22.3 % (ref 14.0–49.7)
MCH: 28.7 pg (ref 25.1–34.0)
MCHC: 32.7 g/dL (ref 31.5–36.0)
MCV: 87.9 fL (ref 79.5–101.0)
MONO#: 0.5 10*3/uL (ref 0.1–0.9)
MONO%: 6.7 % (ref 0.0–14.0)
NEUT#: 5.2 10*3/uL (ref 1.5–6.5)
NEUT%: 68.3 % (ref 38.4–76.8)
Platelets: 168 10*3/uL (ref 145–400)
RBC: 4.04 10*6/uL (ref 3.70–5.45)
RDW: 14.8 % — AB (ref 11.2–14.5)
WBC: 7.6 10*3/uL (ref 3.9–10.3)

## 2014-08-31 MED ORDER — SODIUM CHLORIDE 0.9 % IJ SOLN
10.0000 mL | INTRAMUSCULAR | Status: DC | PRN
  Administered 2014-08-31: 10 mL via INTRAVENOUS
  Filled 2014-08-31: qty 10

## 2014-08-31 MED ORDER — CYANOCOBALAMIN 1000 MCG/ML IJ SOLN
1000.0000 ug | Freq: Once | INTRAMUSCULAR | Status: AC
  Administered 2014-08-31: 1000 ug via INTRAMUSCULAR

## 2014-08-31 MED ORDER — HEPARIN SOD (PORK) LOCK FLUSH 100 UNIT/ML IV SOLN
500.0000 [IU] | Freq: Once | INTRAVENOUS | Status: AC
  Administered 2014-08-31: 500 [IU] via INTRAVENOUS
  Filled 2014-08-31: qty 5

## 2014-08-31 NOTE — Progress Notes (Signed)
Vitamin b-12 given in flush room by flush nurse

## 2014-08-31 NOTE — Progress Notes (Signed)
[place] [phone]   Fax:(336) [phone]  OFFICE PROGRESS NOTE  [name], NP [phone] [address] [place] [address]  DIAGNOSIS AND STAGE:  #1 Recurrent non-small cell lung cancer consistent with adenocarcinoma. This was initially diagnosed as stage IB (T2a N0 M0) well-differentiated adenocarcinoma of bronchioalveolar type in December 2010.  #2 Acute pulmonary embolism in the right lower lobe lobar, segmental and subsegmental sized pulmonary arteries, diagnosed on 11/04/2011.   PRIOR THERAPY:  1.Status post right upper lobectomy on September 28, 2009 under the care of Dr. Arlyce Dice. The patient refused adjuvant chemotherapy at that time. She had disease recurrence in June of 2012.  2.Status post 4 cycles of systemic chemotherapy with carboplatin for an AUC of 6 and paclitaxel at 200 mg per meter squared and Avastin at 15 mg/kg according to the ECOG protocol #5508.  3. Chemotherapy with Avastin 15 mg/kg according to the ECOG protocol #5508 status post 32 cycles. Last cycle was given on 06/30/2013 discontinued secondary to disease progression and persistent proteinuria. 4. Tarceva 150 mg by mouth daily, started 08/09/2013, status post 10 months of treatment.  CURRENT THERAPY: Tarceva 100 mg by mouth daily started 07/02/2014  CHEMOTHERAPY INTENT: Palliative.  CURRENT # OF CHEMOTHERAPY CYCLES: 3 CURRENT ANTIEMETICS: Compazine  CURRENT SMOKING STATUS: Non-smoker  ORAL CHEMOTHERAPY AND CONSENT: Tarceva and consent was signed 07/21/2013.  CURRENT BISPHOSPHONATES USE: None  PAIN MANAGEMENT: No pain  NARCOTICS INDUCED CONSTIPATION: No constipation.  LIVING WILL AND CODE STATUS: Full code   INTERVAL HISTORY: [name] 73 y.o. female returns to the clinic today for followup visit. The patient is feeling fine today except for mild skin rash mainly on the face and diarrhea but only once a day. She was started on treatment with Cymbalta for  peripheral neuropathy but the patient was unable to tolerated and discontinued the treatment. She is tolerating her current dose of Tarceva 100 mg by mouth daily fairly well. She did not notice any increase in the diarrhea or skin rash.  She denied having any significant nausea or vomiting. She denied having any chest pain but continues to have shortness of breath with exertion with no hemoptysis. The patient denied having any fever or chills. She had repeat CT scan of the chest performed recently and she is here for evaluation and discussion of her scan results.  MEDICAL HISTORY: Past Medical History  Diagnosis Date  . Hypertension   . Hypothyroidism   . Hypercholesterolemia   . Glaucoma   . Hip pain 09/02/2011  . Foot pain 09/02/2011  . Lung cancer 03/08/2011    recurrent    ALLERGIES:  is allergic to cymbalta; fish allergy; amitriptyline; codeine; and sulfa antibiotics.  MEDICATIONS:  Current Outpatient Prescriptions  Medication Sig Dispense Refill  . amLODipine (NORVASC) 5 MG tablet Take 10 mg by mouth daily. Per [name], NP    . clindamycin (CLEOCIN T) 1 % lotion Apply topically 2 (two) times daily. Apply twice daily as needed 120 mL 0  . clonazePAM (KLONOPIN) 0.5 MG tablet Take 0.5 mg by mouth 2 (two) times daily as needed. Per [name], NP    . cyanocobalamin (,VITAMIN B-12,) 1000 MCG/ML injection Inject 1,000 mcg into the muscle every 30 (thirty) days.    . dorzolamide-timolol (COSOPT) 22.3-6.8 MG/ML ophthalmic solution 2 drops 2 (two) times daily. Per [name], NP     . doxycycline (VIBRA-TABS) 100 MG tablet Take 1 tablet (100 mg total) by mouth  every 12 (twelve) hours. 10 tablet 0  . levothyroxine (SYNTHROID, LEVOTHROID) 88 MCG tablet Take 88 mcg by mouth daily before breakfast.    . lidocaine-prilocaine (EMLA) cream Apply 1 application topically as needed (prior to port access).     Marland Kitchen lisinopril (PRINIVIL,ZESTRIL) 40 MG tablet Take 40 mg by mouth daily. Per  [name], NP     . ondansetron (ZOFRAN) 4 MG tablet Take 1 tablet (4 mg total) by mouth every 8 (eight) hours as needed for nausea or vomiting. 20 tablet 0  . pravastatin (PRAVACHOL) 80 MG tablet Take 80 mg by mouth daily.     Marland Kitchen TARCEVA 100 MG tablet TAKE 1 TABLET BY MOUTH    DAILY ON AN EMPTY STOMACH 30 tablet 0  . diphenoxylate-atropine (LOMOTIL) 2.5-0.025 MG per tablet Take 1 to 2 tablets by mouth every 6 hours as needed for diarrhea 30 tablet 1  . loperamide (IMODIUM) 2 MG capsule Take 1 capsule (2 mg total) by mouth every 4 (four) hours as needed for diarrhea or loose stools. 30 capsule 0  . prochlorperazine (COMPAZINE) 10 MG tablet Take 10 mg by mouth every 6 (six) hours as needed (for nausea).      No current facility-administered medications for this visit.    SURGICAL HISTORY:  Past Surgical History  Procedure Laterality Date  . Video assisted thoracoscopy  09/28/2009  . Thoracotomy  09/28/2009    mini  . Lung lobectomy  09/28/2009    right upper    REVIEW OF SYSTEMS:  Constitutional: positive for fatigue Eyes: negative Ears, nose, mouth, throat, and face: negative Respiratory: negative Cardiovascular: negative Gastrointestinal: Intermittent diarrhea Genitourinary: Increased urine frequency Integument/breast: Acne-like skin rash on the face especially the nose area. Hematologic/lymphatic: negative Musculoskeletal:negative Neurological: positive for paresthesia Behavioral/Psych: negative Endocrine: negative Allergic/Immunologic: negative   PHYSICAL EXAMINATION: General appearance: alert, cooperative, fatigued and no distress Head: Normocephalic, without obvious abnormality, atraumatic Neck: no adenopathy, no JVD, supple, symmetrical, trachea midline and thyroid not enlarged, symmetric, no tenderness/mass/nodules Lymph nodes: Cervical, supraclavicular, and axillary nodes normal. Resp: clear to auscultation bilaterally Back: symmetric, no curvature. ROM normal. No  CVA tenderness. Cardio: regular rate and rhythm, S1, S2 normal, no murmur, click, rub or gallop GI: soft, non-tender; bowel sounds normal; no masses,  no organomegaly Extremities: extremities normal, atraumatic, no cyanosis or edema Neurologic: Alert and oriented X 3, normal strength and tone. Normal symmetric reflexes. Normal coordination and gait  ECOG PERFORMANCE STATUS: 1 - Symptomatic but completely ambulatory  Blood pressure 155/72, pulse 77, temperature 0 F (-17.8 C), resp. rate 18, height 5\' 5"  (1.651 m), weight 135 lb 11.2 oz (61.553 kg).  LABORATORY DATA: Lab Results  Component Value Date   WBC 7.6 08/31/2014   HGB 11.6 08/31/2014   HCT 35.5 08/31/2014   MCV 87.9 08/31/2014   PLT 168 08/31/2014      Chemistry      Component Value Date/Time   NA 142 08/31/2014 0949   NA 144 05/25/2014 0332   NA 142 03/08/2011 0754   K 3.7 08/31/2014 0949   K 3.7 05/25/2014 0332   K 4.2 03/08/2011 0754   CL 109 05/25/2014 0332   CL 107 03/30/2013 1036   CL 102 03/08/2011 0754   CO2 24 08/31/2014 0949   CO2 25 05/25/2014 0332   CO2 27 03/08/2011 0754   BUN 12.9 08/31/2014 0949   BUN 5* 05/25/2014 0332   BUN 9 03/08/2011 0754   CREATININE 1.2* 08/31/2014 9476  CREATININE 1.15* 05/25/2014 0332   CREATININE 1.0 03/08/2011 0754      Component Value Date/Time   CALCIUM 9.9 08/31/2014 0949   CALCIUM 9.2 05/25/2014 0332   CALCIUM 9.5 03/08/2011 0754   ALKPHOS 97 08/31/2014 0949   ALKPHOS 67 05/25/2014 0332   ALKPHOS 59 03/08/2011 0754   AST 12 08/31/2014 0949   AST 17 05/25/2014 0332   AST 19 03/08/2011 0754   ALT 10 08/31/2014 0949   ALT 9 05/25/2014 0332   ALT 18 03/08/2011 0754   BILITOT 0.57 08/31/2014 0949   BILITOT 0.4 05/25/2014 0332   BILITOT 0.50 03/08/2011 0754     RADIOLOGY RESULTS: Ct Chest W Contrast  08/25/2014   CLINICAL DATA:  Upper lung cancer diagnosed 08/2013.  EXAM: CT CHEST WITH CONTRAST  TECHNIQUE: Multidetector CT imaging of the chest was  performed during intravenous contrast administration.  CONTRAST:  63mL OMNIPAQUE IOHEXOL 300 MG/ML  SOLN  COMPARISON:  04/03/2014  FINDINGS: Mediastinum: The heart size appears normal. There is no pericardial effusion. Calcified atherosclerotic disease involves the thoracic aorta as well as the LAD, RCA and left circumflex coronary arteries. The trachea appears patent. Normal appearance of the esophagus.  Lungs/Pleura: There is no pleural effusion. Postoperative change in volume loss within the right hemi thorax is again identified. Scattered sub solid lesions are identified throughout both lungs as seen on the previous exam. Index lesion within the medial right lower lobe measures 3 cm, image 33/series 5. This is unchanged from previous exam. Mixed cystic and solid lesion within the superior segment of left lower lobe is stable measuring 5.3 cm, image 25/series 5. The solid component is equal to 2 cm, image 25/ series 5. This is unchanged from previous exam. Similar appearance of focal area of architectural distortion/ scarring in the left upper lobe, image number 12/series 5. The index sub solid lesion within the right base is stable measuring 1 cm, image 42/series 5 the small spiculated nodule within the right upper lung appears less conspicuous measuring 6 x 7 mm, image 16/series 5. Previously 8 x 1.2 cm. Calcified granuloma noted in the left upper lobe.  Upper Abdomen: 5 mm low attenuation structure in the right hepatic lobe is identified. This is unchanged and likely represents a cyst. Calcified granulomas are noted within the spleen. The adrenal glands are both normal.  Musculoskeletal: There are no aggressive lytic or sclerotic bone lesions identified.  IMPRESSION: 1. No acute findings. 2. Stable CT appearance of the chest. Multifocal mixed sub solid and solid lesions in both lungs are not significantly changed in size when compared with previous exam. 3. The previously referenced 8 x 12 mm nodular density  in the right upper lobe appears less conspicuous compatible with infectious/inflammatory etiology.   Electronically Signed   By: Kerby Moors M.D.   On: 08/25/2014 13:00    ASSESSMENT AND PLAN: This is a very pleasant 73 years old [place] American female with recurrent non-small cell lung cancer most recently treated with maintenance Avastin status post 32 cycles. She is currently on treatment with single agent Tarceva 150 mg by mouth daily status post 10 months and tolerating her treatment fairly well except for mild skin rash and occasional diarrhea. She is tolerating her current treatment with Tarceva 100 milligrams by mouth daily fairly well. The recent CT scan of the chest showed no evidence for disease progression. I discussed the scan results with the patient today. I recommended for her to continue Tarceva with the same  dose. For diarrhea, she will continue on Imodium when necessary. For the skin rash, she will continue to apply the clindamycin 1% lotion to the skin rash area twice a day. The patient will receive vitamin B12 injection today. She will come back for followup visit in one month for reevaluation with repeat blood work. The patient was advised to call immediately if she has any concerning symptoms in the interval. The patient voices understanding of current disease status and treatment options and is in agreement with the current care plan.  All questions were answered. The patient knows to call the clinic with any problems, questions or concerns. We can certainly see the patient much sooner if necessary.  Disclaimer: This note was dictated with voice recognition software. Similar sounding words can inadvertently be transcribed and may not be corrected upon review.

## 2014-08-31 NOTE — Patient Instructions (Signed)

## 2014-08-31 NOTE — Telephone Encounter (Signed)
Pt confirmed labs/ov per 11/18 POF, gave pt AVS..... KJ

## 2014-09-06 ENCOUNTER — Other Ambulatory Visit: Payer: Self-pay | Admitting: Internal Medicine

## 2014-09-06 DIAGNOSIS — C3431 Malignant neoplasm of lower lobe, right bronchus or lung: Secondary | ICD-10-CM

## 2014-09-27 ENCOUNTER — Other Ambulatory Visit (HOSPITAL_BASED_OUTPATIENT_CLINIC_OR_DEPARTMENT_OTHER): Payer: Medicare Other

## 2014-09-27 ENCOUNTER — Encounter: Payer: Self-pay | Admitting: Physician Assistant

## 2014-09-27 ENCOUNTER — Ambulatory Visit: Payer: Medicare Other

## 2014-09-27 ENCOUNTER — Ambulatory Visit (HOSPITAL_BASED_OUTPATIENT_CLINIC_OR_DEPARTMENT_OTHER): Payer: Medicare Other

## 2014-09-27 ENCOUNTER — Telehealth: Payer: Self-pay | Admitting: Physician Assistant

## 2014-09-27 ENCOUNTER — Ambulatory Visit (HOSPITAL_BASED_OUTPATIENT_CLINIC_OR_DEPARTMENT_OTHER): Payer: Medicare Other | Admitting: Physician Assistant

## 2014-09-27 VITALS — BP 157/76 | HR 85 | Temp 97.7°F | Resp 17 | Ht 65.0 in | Wt 136.7 lb

## 2014-09-27 DIAGNOSIS — C3431 Malignant neoplasm of lower lobe, right bronchus or lung: Secondary | ICD-10-CM

## 2014-09-27 DIAGNOSIS — Z95828 Presence of other vascular implants and grafts: Secondary | ICD-10-CM

## 2014-09-27 DIAGNOSIS — C3411 Malignant neoplasm of upper lobe, right bronchus or lung: Secondary | ICD-10-CM

## 2014-09-27 DIAGNOSIS — C3481 Malignant neoplasm of overlapping sites of right bronchus and lung: Secondary | ICD-10-CM

## 2014-09-27 DIAGNOSIS — E538 Deficiency of other specified B group vitamins: Secondary | ICD-10-CM

## 2014-09-27 LAB — COMPREHENSIVE METABOLIC PANEL (CC13)
ALK PHOS: 89 U/L (ref 40–150)
ALT: 20 U/L (ref 0–55)
AST: 15 U/L (ref 5–34)
Albumin: 3.9 g/dL (ref 3.5–5.0)
Anion Gap: 10 mEq/L (ref 3–11)
BUN: 11.9 mg/dL (ref 7.0–26.0)
CHLORIDE: 107 meq/L (ref 98–109)
CO2: 24 mEq/L (ref 22–29)
CREATININE: 1.4 mg/dL — AB (ref 0.6–1.1)
Calcium: 9.9 mg/dL (ref 8.4–10.4)
EGFR: 44 mL/min/{1.73_m2} — ABNORMAL LOW (ref >90)
Glucose: 84 mg/dl (ref 70–140)
Potassium: 3.9 mEq/L (ref 3.5–5.1)
Sodium: 141 mEq/L (ref 136–145)
Total Bilirubin: 0.73 mg/dL (ref 0.20–1.20)
Total Protein: 6.9 g/dL (ref 6.4–8.3)

## 2014-09-27 LAB — CBC WITH DIFFERENTIAL/PLATELET
BASO%: 0.8 % (ref 0.0–2.0)
Basophils Absolute: 0.1 10*3/uL (ref 0.0–0.1)
EOS%: 2.2 % (ref 0.0–7.0)
Eosinophils Absolute: 0.2 10*3/uL (ref 0.0–0.5)
HEMATOCRIT: 38 % (ref 34.8–46.6)
HGB: 12.1 g/dL (ref 11.6–15.9)
LYMPH%: 21.4 % (ref 14.0–49.7)
MCH: 28.2 pg (ref 25.1–34.0)
MCHC: 31.9 g/dL (ref 31.5–36.0)
MCV: 88.4 fL (ref 79.5–101.0)
MONO#: 0.6 10*3/uL (ref 0.1–0.9)
MONO%: 7.2 % (ref 0.0–14.0)
NEUT#: 5.5 10*3/uL (ref 1.5–6.5)
NEUT%: 68.4 % (ref 38.4–76.8)
Platelets: 191 10*3/uL (ref 145–400)
RBC: 4.3 10*6/uL (ref 3.70–5.45)
RDW: 15.1 % — AB (ref 11.2–14.5)
WBC: 8.1 10*3/uL (ref 3.9–10.3)
lymph#: 1.7 10*3/uL (ref 0.9–3.3)

## 2014-09-27 MED ORDER — CYANOCOBALAMIN 1000 MCG/ML IJ SOLN
1000.0000 ug | Freq: Once | INTRAMUSCULAR | Status: AC
  Administered 2014-09-27: 1000 ug via INTRAMUSCULAR

## 2014-09-27 MED ORDER — SODIUM CHLORIDE 0.9 % IJ SOLN
10.0000 mL | INTRAMUSCULAR | Status: DC | PRN
  Administered 2014-09-27: 10 mL via INTRAVENOUS
  Filled 2014-09-27: qty 10

## 2014-09-27 MED ORDER — HEPARIN SOD (PORK) LOCK FLUSH 100 UNIT/ML IV SOLN
500.0000 [IU] | Freq: Once | INTRAVENOUS | Status: AC
  Administered 2014-09-27: 500 [IU] via INTRAVENOUS
  Filled 2014-09-27: qty 5

## 2014-09-27 NOTE — Patient Instructions (Signed)

## 2014-09-27 NOTE — Telephone Encounter (Signed)
Gave avs & cal for Jan.

## 2014-09-27 NOTE — Patient Instructions (Signed)
Continue Tarceva at the current dose Follow up in 1 month

## 2014-09-27 NOTE — Progress Notes (Addendum)
[facility] Telephone:(336) [phone]   Fax:(336) [phone]  OFFICE PROGRESS NOTE  Terri Levels, NP [phone] [place] [address]  DIAGNOSIS AND STAGE:  #1 Recurrent non-small cell lung cancer consistent with adenocarcinoma. This was initially diagnosed as stage IB (T2a N0 M0) well-differentiated adenocarcinoma of bronchioalveolar type in December 2010.  #2 Acute pulmonary embolism in the right lower lobe lobar, segmental and subsegmental sized pulmonary arteries, diagnosed on 11/04/2011.   PRIOR THERAPY:  1.Status post right upper lobectomy on September 28, 2009 under the care of Dr. Arlyce Dice. The patient refused adjuvant chemotherapy at that time. She had disease recurrence in June of 2012.  2.Status post 4 cycles of systemic chemotherapy with carboplatin for an AUC of 6 and paclitaxel at 200 mg per meter squared and Avastin at 15 mg/kg according to the ECOG protocol #5508.  3. Chemotherapy with Avastin 15 mg/kg according to the ECOG protocol #5508 status post 32 cycles. Last cycle was given on 06/30/2013 discontinued secondary to disease progression and persistent proteinuria. 4. Tarceva 150 mg by mouth daily, started 08/09/2013, status post 10 months of treatment.  CURRENT THERAPY: Tarceva 100 mg by mouth daily started 07/02/2014  CHEMOTHERAPY INTENT: Palliative.  CURRENT # OF CHEMOTHERAPY CYCLES: 4 CURRENT ANTIEMETICS: Compazine  CURRENT SMOKING STATUS: Non-smoker  ORAL CHEMOTHERAPY AND CONSENT: Tarceva and consent was signed 07/21/2013.  CURRENT BISPHOSPHONATES USE: None  PAIN MANAGEMENT: No pain  NARCOTICS INDUCED CONSTIPATION: No constipation.  LIVING WILL AND CODE STATUS: Full code   INTERVAL HISTORY: [name] 73 y.o. female returns to the clinic today for followup visit. The patient is feeling fine today except for mild skin rash mainly on the face and diarrhea but only once a day. She was started on treatment with Cymbalta for  peripheral neuropathy but the patient was unable to tolerated and discontinued the treatment. She is tolerating her current dose of Tarceva 100 mg by mouth daily fairly well. She did not notice any increase in the diarrhea or skin rash.  In fact she noticed a significant decrease in episodesof diarrhea.She denied having any significant nausea or vomiting. She denied having any chest pain but continues to have shortness of breath with exertion with no hemoptysis. The patient denied having any fever or chills. She does complain of urinary frequency. This has been evaluated by her primary care physician and she is currently on a seven day course of Cipro 500 mg twice daily.  MEDICAL HISTORY: Past Medical History  Diagnosis Date  . Hypertension   . Hypothyroidism   . Hypercholesterolemia   . Glaucoma   . Hip pain 09/02/2011  . Foot pain 09/02/2011  . Lung cancer 03/08/2011    recurrent    ALLERGIES:  is allergic to cymbalta; fish allergy; amitriptyline; codeine; and sulfa antibiotics.  MEDICATIONS:  Current Outpatient Prescriptions  Medication Sig Dispense Refill  . amLODipine (NORVASC) 5 MG tablet Take 10 mg by mouth daily. Per [name], NP    . ciprofloxacin (CIPRO) 500 MG tablet Take 500 mg by mouth 2 (two) times daily.  0  . clindamycin (CLEOCIN T) 1 % lotion Apply topically 2 (two) times daily. Apply twice daily as needed 120 mL 0  . clonazePAM (KLONOPIN) 0.5 MG tablet Take 0.5 mg by mouth 2 (two) times daily as needed. Per [name], NP    . cyanocobalamin (,VITAMIN B-12,) 1000 MCG/ML injection Inject 1,000 mcg into the muscle every 30 (thirty) days.    Marland Kitchen  diphenoxylate-atropine (LOMOTIL) 2.5-0.025 MG per tablet Take 1 to 2 tablets by mouth every 6 hours as needed for diarrhea 30 tablet 1  . dorzolamide-timolol (COSOPT) 22.3-6.8 MG/ML ophthalmic solution 2 drops 2 (two) times daily. Per [name], NP     . levothyroxine (SYNTHROID, LEVOTHROID) 88 MCG tablet Take 88 mcg by  mouth daily before breakfast.    . lidocaine-prilocaine (EMLA) cream Apply 1 application topically as needed (prior to port access).     Marland Kitchen lisinopril (PRINIVIL,ZESTRIL) 40 MG tablet Take 40 mg by mouth daily. Per [name], NP     . loperamide (IMODIUM) 2 MG capsule Take 1 capsule (2 mg total) by mouth every 4 (four) hours as needed for diarrhea or loose stools. 30 capsule 0  . ondansetron (ZOFRAN) 4 MG tablet Take 1 tablet (4 mg total) by mouth every 8 (eight) hours as needed for nausea or vomiting. 20 tablet 0  . pravastatin (PRAVACHOL) 80 MG tablet Take 80 mg by mouth daily.     . prochlorperazine (COMPAZINE) 10 MG tablet Take 10 mg by mouth every 6 (six) hours as needed (for nausea).     . TARCEVA 100 MG tablet TAKE 1 TABLET BY MOUTH    DAILY ON AN EMPTY STOMACH 30 tablet 0  . doxycycline (VIBRA-TABS) 100 MG tablet Take 1 tablet (100 mg total) by mouth every 12 (twelve) hours. (Patient not taking: Reported on 09/27/2014) 10 tablet 0   No current facility-administered medications for this visit.    SURGICAL HISTORY:  Past Surgical History  Procedure Laterality Date  . Video assisted thoracoscopy  09/28/2009  . Thoracotomy  09/28/2009    mini  . Lung lobectomy  09/28/2009    right upper    REVIEW OF SYSTEMS:  Constitutional: positive for fatigue Eyes: negative Ears, nose, mouth, throat, and face: negative Respiratory: negative Cardiovascular: negative Gastrointestinal: Intermittent diarrhea Genitourinary: Increased urine frequency Integument/breast: Acne-like skin rash on the face especially the nose area. Hematologic/lymphatic: negative Musculoskeletal:negative Neurological: positive for paresthesia Behavioral/Psych: negative Endocrine: negative Allergic/Immunologic: negative   PHYSICAL EXAMINATION: General appearance: alert, cooperative, fatigued and no distress Head: Normocephalic, without obvious abnormality, atraumatic Neck: no adenopathy, no JVD, supple,  symmetrical, trachea midline and thyroid not enlarged, symmetric, no tenderness/mass/nodules Lymph nodes: Cervical, supraclavicular, and axillary nodes normal. Resp: clear to auscultation bilaterally Back: symmetric, no curvature. ROM normal. No CVA tenderness. Cardio: regular rate and rhythm, S1, S2 normal, no murmur, click, rub or gallop GI: soft, non-tender; bowel sounds normal; no masses,  no organomegaly Extremities: extremities normal, atraumatic, no cyanosis or edema Neurologic: Alert and oriented X 3, normal strength and tone. Normal symmetric reflexes. Normal coordination and gait  ECOG PERFORMANCE STATUS: 1 - Symptomatic but completely ambulatory  Blood pressure 157/76, pulse 85, temperature 97.7 F (36.5 C), temperature source Oral, resp. rate 17, height 5\' 5"  (1.651 m), weight 136 lb 11.2 oz (62.007 kg).  LABORATORY DATA: Lab Results  Component Value Date   WBC 8.1 09/27/2014   HGB 12.1 09/27/2014   HCT 38.0 09/27/2014   MCV 88.4 09/27/2014   PLT 191 09/27/2014      Chemistry      Component Value Date/Time   NA 141 09/27/2014 1131   NA 144 05/25/2014 0332   NA 142 03/08/2011 0754   K 3.9 09/27/2014 1131   K 3.7 05/25/2014 0332   K 4.2 03/08/2011 0754   CL 109 05/25/2014 0332   CL 107 03/30/2013 1036   CL 102 03/08/2011 0754  CO2 24 09/27/2014 1131   CO2 25 05/25/2014 0332   CO2 27 03/08/2011 0754   BUN 11.9 09/27/2014 1131   BUN 5* 05/25/2014 0332   BUN 9 03/08/2011 0754   CREATININE 1.4* 09/27/2014 1131   CREATININE 1.15* 05/25/2014 0332   CREATININE 1.0 03/08/2011 0754      Component Value Date/Time   CALCIUM 9.9 09/27/2014 1131   CALCIUM 9.2 05/25/2014 0332   CALCIUM 9.5 03/08/2011 0754   ALKPHOS 89 09/27/2014 1131   ALKPHOS 67 05/25/2014 0332   ALKPHOS 59 03/08/2011 0754   AST 15 09/27/2014 1131   AST 17 05/25/2014 0332   AST 19 03/08/2011 0754   ALT 20 09/27/2014 1131   ALT 9 05/25/2014 0332   ALT 18 03/08/2011 0754   BILITOT 0.73  09/27/2014 1131   BILITOT 0.4 05/25/2014 0332   BILITOT 0.50 03/08/2011 0754     RADIOLOGY RESULTS: No results found.  ASSESSMENT AND PLAN: This is a very pleasant 73 years old [place] American female with recurrent non-small cell lung cancer most recently treated with maintenance Avastin status post 32 cycles. She is currently on treatment with single agent Tarceva 150 mg by mouth daily status post 10 months and tolerating her treatment fairly well except for mild skin rash and occasional diarrhea. She is tolerating her current treatment with Tarceva 100 milligrams by mouth daily fairly well. The recent CT scan of the chest showed no evidence for disease progression. The patient was discussed with and also seen by Dr. Julien Nordmann. She will continue Tarceva with the same dose. For diarrhea, she will continue on Imodium when necessary. For the skin rash, she will continue to apply the clindamycin 1% lotion to the skin rash area twice a day. The patient will receive vitamin B12 injection today. She will come back for followup visit in one month for reevaluation with repeat blood work. The patient was advised to call immediately if she has any concerning symptoms in the interval. The patient voices understanding of current disease status and treatment options and is in agreement with the current care plan.  All questions were answered. The patient knows to call the clinic with any problems, questions or concerns. We can certainly see the patient much sooner if necessary.  Carlton Adam, PA-C 09/27/2014  ADDENDUM: Hematology/Oncology Attending: I had a face to face encounter with the patient. I recommended her care plan. This is a very pleasant 73 years old white female with recurrent non-small cell lung cancer, adenocarcinoma. She is currently undergoing treatment with oral Tarceva 100 mg by mouth daily and tolerating her treatment fairly well with no significant adverse effects except for  mild skin rash and occasional diarrhea. I recommended for the patient to continue her current treatment with Tarceva at the same dose. She will come back for follow-up visit in one month's for reevaluation after repeating blood work. She was advised to call immediately if she has any concerning symptoms in the interval.  Disclaimer: This note was dictated with voice recognition software. Similar sounding words can inadvertently be transcribed and may not be corrected upon review. Eilleen Kempf., MD 09/28/2014

## 2014-10-04 ENCOUNTER — Other Ambulatory Visit: Payer: Self-pay | Admitting: Internal Medicine

## 2014-10-04 DIAGNOSIS — C3490 Malignant neoplasm of unspecified part of unspecified bronchus or lung: Secondary | ICD-10-CM

## 2014-10-13 ENCOUNTER — Other Ambulatory Visit: Payer: Self-pay | Admitting: Internal Medicine

## 2014-10-25 ENCOUNTER — Ambulatory Visit: Payer: Medicare Other

## 2014-10-25 ENCOUNTER — Ambulatory Visit: Payer: Medicare Other | Admitting: Internal Medicine

## 2014-10-25 ENCOUNTER — Other Ambulatory Visit: Payer: Medicare Other

## 2014-10-25 ENCOUNTER — Telehealth: Payer: Self-pay | Admitting: Internal Medicine

## 2014-10-25 NOTE — Telephone Encounter (Signed)
returned pt call and s.w. pt regarding r/s appt due to car problems....pt ok and aware of new d.t

## 2014-11-02 ENCOUNTER — Telehealth: Payer: Self-pay | Admitting: Internal Medicine

## 2014-11-02 NOTE — Telephone Encounter (Signed)
returned call and r/s appt per pt request....pt ok and aware

## 2014-11-04 ENCOUNTER — Ambulatory Visit: Payer: Self-pay

## 2014-11-04 ENCOUNTER — Ambulatory Visit: Payer: Self-pay | Admitting: Physician Assistant

## 2014-11-04 ENCOUNTER — Other Ambulatory Visit: Payer: Self-pay

## 2014-11-07 ENCOUNTER — Other Ambulatory Visit: Payer: Self-pay | Admitting: Internal Medicine

## 2014-11-14 DIAGNOSIS — H524 Presbyopia: Secondary | ICD-10-CM | POA: Diagnosis not present

## 2014-11-14 DIAGNOSIS — H16103 Unspecified superficial keratitis, bilateral: Secondary | ICD-10-CM | POA: Diagnosis not present

## 2014-11-14 DIAGNOSIS — H5213 Myopia, bilateral: Secondary | ICD-10-CM | POA: Diagnosis not present

## 2014-11-14 DIAGNOSIS — H52223 Regular astigmatism, bilateral: Secondary | ICD-10-CM | POA: Diagnosis not present

## 2014-11-14 DIAGNOSIS — H40013 Open angle with borderline findings, low risk, bilateral: Secondary | ICD-10-CM | POA: Diagnosis not present

## 2014-11-14 DIAGNOSIS — H2513 Age-related nuclear cataract, bilateral: Secondary | ICD-10-CM | POA: Diagnosis not present

## 2014-11-16 ENCOUNTER — Other Ambulatory Visit (HOSPITAL_BASED_OUTPATIENT_CLINIC_OR_DEPARTMENT_OTHER): Payer: Medicare Other

## 2014-11-16 ENCOUNTER — Encounter: Payer: Self-pay | Admitting: Physician Assistant

## 2014-11-16 ENCOUNTER — Ambulatory Visit (HOSPITAL_BASED_OUTPATIENT_CLINIC_OR_DEPARTMENT_OTHER): Payer: Medicare Other

## 2014-11-16 ENCOUNTER — Ambulatory Visit: Payer: Medicare Other

## 2014-11-16 ENCOUNTER — Telehealth: Payer: Self-pay | Admitting: Internal Medicine

## 2014-11-16 ENCOUNTER — Ambulatory Visit (HOSPITAL_BASED_OUTPATIENT_CLINIC_OR_DEPARTMENT_OTHER): Payer: Medicare Other | Admitting: Physician Assistant

## 2014-11-16 VITALS — BP 141/58 | HR 63 | Temp 98.8°F | Resp 18 | Ht 65.0 in | Wt 137.2 lb

## 2014-11-16 DIAGNOSIS — C3481 Malignant neoplasm of overlapping sites of right bronchus and lung: Secondary | ICD-10-CM

## 2014-11-16 DIAGNOSIS — C3412 Malignant neoplasm of upper lobe, left bronchus or lung: Secondary | ICD-10-CM

## 2014-11-16 DIAGNOSIS — R197 Diarrhea, unspecified: Secondary | ICD-10-CM

## 2014-11-16 DIAGNOSIS — R0602 Shortness of breath: Secondary | ICD-10-CM

## 2014-11-16 DIAGNOSIS — C3490 Malignant neoplasm of unspecified part of unspecified bronchus or lung: Secondary | ICD-10-CM

## 2014-11-16 DIAGNOSIS — R21 Rash and other nonspecific skin eruption: Secondary | ICD-10-CM

## 2014-11-16 DIAGNOSIS — G629 Polyneuropathy, unspecified: Secondary | ICD-10-CM

## 2014-11-16 DIAGNOSIS — Z95828 Presence of other vascular implants and grafts: Secondary | ICD-10-CM

## 2014-11-16 DIAGNOSIS — C3411 Malignant neoplasm of upper lobe, right bronchus or lung: Secondary | ICD-10-CM

## 2014-11-16 DIAGNOSIS — R350 Frequency of micturition: Secondary | ICD-10-CM

## 2014-11-16 DIAGNOSIS — E538 Deficiency of other specified B group vitamins: Secondary | ICD-10-CM

## 2014-11-16 LAB — COMPREHENSIVE METABOLIC PANEL (CC13)
ALK PHOS: 84 U/L (ref 40–150)
ALT: 13 U/L (ref 0–55)
ANION GAP: 9 meq/L (ref 3–11)
AST: 15 U/L (ref 5–34)
Albumin: 3.6 g/dL (ref 3.5–5.0)
BILIRUBIN TOTAL: 0.54 mg/dL (ref 0.20–1.20)
BUN: 14.1 mg/dL (ref 7.0–26.0)
CHLORIDE: 108 meq/L (ref 98–109)
CO2: 24 mEq/L (ref 22–29)
CREATININE: 1.4 mg/dL — AB (ref 0.6–1.1)
Calcium: 8.9 mg/dL (ref 8.4–10.4)
EGFR: 43 mL/min/{1.73_m2} — ABNORMAL LOW (ref >90)
GLUCOSE: 96 mg/dL (ref 70–140)
Potassium: 4 mEq/L (ref 3.5–5.1)
SODIUM: 141 meq/L (ref 136–145)
Total Protein: 6.5 g/dL (ref 6.4–8.3)

## 2014-11-16 LAB — CBC WITH DIFFERENTIAL/PLATELET
BASO%: 1 % (ref 0.0–2.0)
Basophils Absolute: 0.1 10*3/uL (ref 0.0–0.1)
EOS ABS: 0.2 10*3/uL (ref 0.0–0.5)
EOS%: 2.9 % (ref 0.0–7.0)
HCT: 36.5 % (ref 34.8–46.6)
HGB: 11.7 g/dL (ref 11.6–15.9)
LYMPH%: 25.2 % (ref 14.0–49.7)
MCH: 28.4 pg (ref 25.1–34.0)
MCHC: 31.9 g/dL (ref 31.5–36.0)
MCV: 88.9 fL (ref 79.5–101.0)
MONO#: 0.6 10*3/uL (ref 0.1–0.9)
MONO%: 7.5 % (ref 0.0–14.0)
NEUT%: 63.4 % (ref 38.4–76.8)
NEUTROS ABS: 4.8 10*3/uL (ref 1.5–6.5)
PLATELETS: 179 10*3/uL (ref 145–400)
RBC: 4.11 10*6/uL (ref 3.70–5.45)
RDW: 14.5 % (ref 11.2–14.5)
WBC: 7.6 10*3/uL (ref 3.9–10.3)
lymph#: 1.9 10*3/uL (ref 0.9–3.3)

## 2014-11-16 MED ORDER — SODIUM CHLORIDE 0.9 % IJ SOLN
10.0000 mL | INTRAMUSCULAR | Status: DC | PRN
  Administered 2014-11-16: 10 mL via INTRAVENOUS
  Filled 2014-11-16: qty 10

## 2014-11-16 MED ORDER — HEPARIN SOD (PORK) LOCK FLUSH 100 UNIT/ML IV SOLN
500.0000 [IU] | Freq: Once | INTRAVENOUS | Status: AC
  Administered 2014-11-16: 500 [IU] via INTRAVENOUS
  Filled 2014-11-16: qty 5

## 2014-11-16 MED ORDER — CYANOCOBALAMIN 1000 MCG/ML IJ SOLN
1000.0000 ug | Freq: Once | INTRAMUSCULAR | Status: AC
  Administered 2014-11-16: 1000 ug via INTRAMUSCULAR

## 2014-11-16 NOTE — Progress Notes (Addendum)
[facility] Telephone:(336) [phone]   Fax:(336) [phone]  OFFICE PROGRESS NOTE  [name], NP [phone] [place] [address]  DIAGNOSIS AND STAGE:  #1 Recurrent non-small cell lung cancer consistent with adenocarcinoma. This was initially diagnosed as stage IB (T2a N0 M0) well-differentiated adenocarcinoma of bronchioalveolar type in December 2010.  #2 Acute pulmonary embolism in the right lower lobe lobar, segmental and subsegmental sized pulmonary arteries, diagnosed on 11/04/2011.   PRIOR THERAPY:  1.Status post right upper lobectomy on September 28, 2009 under the care of Dr. Arlyce Dice. The patient refused adjuvant chemotherapy at that time. She had disease recurrence in June of 2012.  2.Status post 4 cycles of systemic chemotherapy with carboplatin for an AUC of 6 and paclitaxel at 200 mg per meter squared and Avastin at 15 mg/kg according to the ECOG protocol #5508.  3. Chemotherapy with Avastin 15 mg/kg according to the ECOG protocol #5508 status post 32 cycles. Last cycle was given on 06/30/2013 discontinued secondary to disease progression and persistent proteinuria. 4. Tarceva 150 mg by mouth daily, started 08/09/2013, status post 10 months of treatment.  CURRENT THERAPY: Tarceva 100 mg by mouth daily started 07/02/2014  CHEMOTHERAPY INTENT: Palliative.  CURRENT # OF CHEMOTHERAPY CYCLES: 4 CURRENT ANTIEMETICS: Compazine  CURRENT SMOKING STATUS: Non-smoker  ORAL CHEMOTHERAPY AND CONSENT: Tarceva and consent was signed 07/21/2013.  CURRENT BISPHOSPHONATES USE: None  PAIN MANAGEMENT: No pain  NARCOTICS INDUCED CONSTIPATION: No constipation.  LIVING WILL AND CODE STATUS: Full code   INTERVAL HISTORY: [name] 74 y.o. female returns to the clinic today for followup visit. Patient states that she was unable to return for her follow-up visit in January due to issues with her car as well as the inclement weather. She presented today for  follow-up complaining of increased diarrhea particularly last week. She state she had several severe bouts every other day last week. She's not had much in the way of skin rash lately. Overall she's been tolerating the Tarceva at 100 mg by mouth daily significantly better than when she was taking the 150 mg dose. She voiced no other specific complaints today. She denied having any significant nausea or vomiting. She denied having any chest pain but continues to have shortness of breath with exertion with no hemoptysis. The patient denied having any fever or chills. She she continues to complain of urinary frequency, particularly at night. Her peripheral neuropathy symptoms are stable.  MEDICAL HISTORY: Past Medical History  Diagnosis Date  . Hypertension   . Hypothyroidism   . Hypercholesterolemia   . Glaucoma   . Hip pain 09/02/2011  . Foot pain 09/02/2011  . Lung cancer 03/08/2011    recurrent    ALLERGIES:  is allergic to cymbalta; fish allergy; amitriptyline; codeine; and sulfa antibiotics.  MEDICATIONS:  Current Outpatient Prescriptions  Medication Sig Dispense Refill  . amLODipine (NORVASC) 5 MG tablet Take 10 mg by mouth daily. Per [name], NP    . clindamycin (CLEOCIN T) 1 % lotion Apply topically 2 (two) times daily. Apply twice daily as needed 120 mL 0  . clonazePAM (KLONOPIN) 0.5 MG tablet Take 0.5 mg by mouth 2 (two) times daily as needed. Per [name], NP    . cyanocobalamin (,VITAMIN B-12,) 1000 MCG/ML injection Inject 1,000 mcg into the muscle every 30 (thirty) days.    . dorzolamide-timolol (COSOPT) 22.3-6.8 MG/ML ophthalmic solution 2 drops 2 (two) times daily. Per [name], NP     .  levothyroxine (SYNTHROID, LEVOTHROID) 88 MCG tablet Take 88 mcg by mouth daily before breakfast.    . lidocaine-prilocaine (EMLA) cream Apply 1 application topically as needed (prior to port access).     Marland Kitchen lisinopril (PRINIVIL,ZESTRIL) 40 MG tablet Take 40 mg by mouth daily. Per  [name], NP     . loperamide (IMODIUM) 2 MG capsule Take 1 capsule (2 mg total) by mouth every 4 (four) hours as needed for diarrhea or loose stools. 30 capsule 0  . TARCEVA 100 MG tablet TAKE 1 TABLET BY MOUTH DAILY ON AN EMPTY STOMACH 30 tablet 0  . TARCEVA 100 MG tablet TAKE 1 TABLET BY MOUTH DAILY ON AN EMPTY STOMACH 30 tablet 1  . diphenoxylate-atropine (LOMOTIL) 2.5-0.025 MG per tablet Take 1 to 2 tablets by mouth every 6 hours as needed for diarrhea (Patient not taking: Reported on 11/16/2014) 30 tablet 1  . ondansetron (ZOFRAN) 4 MG tablet Take 1 tablet (4 mg total) by mouth every 8 (eight) hours as needed for nausea or vomiting. (Patient not taking: Reported on 11/16/2014) 20 tablet 0  . pravastatin (PRAVACHOL) 80 MG tablet Take 80 mg by mouth daily.     . prochlorperazine (COMPAZINE) 10 MG tablet Take 10 mg by mouth every 6 (six) hours as needed (for nausea).      No current facility-administered medications for this visit.    SURGICAL HISTORY:  Past Surgical History  Procedure Laterality Date  . Video assisted thoracoscopy  09/28/2009  . Thoracotomy  09/28/2009    mini  . Lung lobectomy  09/28/2009    right upper    REVIEW OF SYSTEMS:  Constitutional: positive for fatigue Eyes: negative Ears, nose, mouth, throat, and face: negative Respiratory: negative Cardiovascular: negative Gastrointestinal: Intermittent diarrhea Genitourinary: Increased urine frequency Integument/breast: Acne-like skin rash on the face especially the nose area. Hematologic/lymphatic: negative Musculoskeletal:negative Neurological: positive for paresthesia Behavioral/Psych: negative Endocrine: negative Allergic/Immunologic: negative   PHYSICAL EXAMINATION: General appearance: alert, cooperative, fatigued and no distress Head: Normocephalic, without obvious abnormality, atraumatic Neck: no adenopathy, no JVD, supple, symmetrical, trachea midline and thyroid not enlarged, symmetric, no  tenderness/mass/nodules Lymph nodes: Cervical, supraclavicular, and axillary nodes normal. Resp: clear to auscultation bilaterally Back: symmetric, no curvature. ROM normal. No CVA tenderness. Cardio: regular rate and rhythm, S1, S2 normal, no murmur, click, rub or gallop GI: soft, non-tender; bowel sounds normal; no masses,  no organomegaly Extremities: extremities normal, atraumatic, no cyanosis or edema Neurologic: Alert and oriented X 3, normal strength and tone. Normal symmetric reflexes. Normal coordination and gait  ECOG PERFORMANCE STATUS: 1 - Symptomatic but completely ambulatory  Blood pressure 141/58, pulse 63, temperature 98.8 F (37.1 C), temperature source Oral, resp. rate 18, height 5\' 5"  (1.651 m), weight 137 lb 3.2 oz (62.234 kg), SpO2 98 %.  LABORATORY DATA: Lab Results  Component Value Date   WBC 7.6 11/16/2014   HGB 11.7 11/16/2014   HCT 36.5 11/16/2014   MCV 88.9 11/16/2014   PLT 179 11/16/2014      Chemistry      Component Value Date/Time   NA 141 11/16/2014 1329   NA 144 05/25/2014 0332   NA 142 03/08/2011 0754   K 4.0 11/16/2014 1329   K 3.7 05/25/2014 0332   K 4.2 03/08/2011 0754   CL 109 05/25/2014 0332   CL 107 03/30/2013 1036   CL 102 03/08/2011 0754   CO2 24 11/16/2014 1329   CO2 25 05/25/2014 0332   CO2 27 03/08/2011 0754  BUN 14.1 11/16/2014 1329   BUN 5* 05/25/2014 0332   BUN 9 03/08/2011 0754   CREATININE 1.4* 11/16/2014 1329   CREATININE 1.15* 05/25/2014 0332   CREATININE 1.0 03/08/2011 0754      Component Value Date/Time   CALCIUM 8.9 11/16/2014 1329   CALCIUM 9.2 05/25/2014 0332   CALCIUM 9.5 03/08/2011 0754   ALKPHOS 84 11/16/2014 1329   ALKPHOS 67 05/25/2014 0332   ALKPHOS 59 03/08/2011 0754   AST 15 11/16/2014 1329   AST 17 05/25/2014 0332   AST 19 03/08/2011 0754   ALT 13 11/16/2014 1329   ALT 9 05/25/2014 0332   ALT 18 03/08/2011 0754   BILITOT 0.54 11/16/2014 1329   BILITOT 0.4 05/25/2014 0332   BILITOT 0.50  03/08/2011 0754     RADIOLOGY RESULTS: No results found.  ASSESSMENT AND PLAN: This is a very pleasant 74 years old [place] American female with recurrent non-small cell lung cancer most recently treated with maintenance Avastin status post 32 cycles. She is currently on treatment with single agent Tarceva 150 mg by mouth daily status post 10 months and tolerating her treatment fairly well except for mild skin rash and occasional diarrhea. She is tolerating her current treatment with Tarceva 100 milligrams by mouth daily fairly well. The last CT scan of the chest showed no evidence for disease progression. The patient was discussed with and also seen by Dr. Julien Nordmann. She will continue Tarceva with the same dose. For diarrhea, she will continue on Imodium when necessary. For the skin rash, she will continue to apply the clindamycin 1% lotion to the skin rash area twice a day as needed. The patient will receive vitamin B12 injection today. She will come back for followup visit in one month for reevaluation with repeat blood work as well as a CT of the chest which we will perform without contrast given that her renal function is slightly worse with a creatinine of 1.4 today. This is likely secondary to her increased diarrhea however we will stay on the safe side and performed the study without contrast.. The patient was advised to call immediately if she has any concerning symptoms in the interval. The patient voices understanding of current disease status and treatment options and is in agreement with the current care plan.  All questions were answered. The patient knows to call the clinic with any problems, questions or concerns. We can certainly see the patient much sooner if necessary.  Carlton Adam, PA-C 11/16/2014  ADDENDUM: Hematology/Oncology Attending: I had a face to face encounter with the patient today. I recommended her care plan. This is a very pleasant 74 years old  African-American female with recurrent non-small cell lung cancer, adenocarcinoma. She is currently on treatment with Tarceva 100 mg by mouth daily and tolerating her treatment well. She has very minimal skin rash but few episodes of diarrhea recently. It is usually in the range of 1-2 times a day.  I recommended for the patient to take her Imodium as needed. She will come back for follow-up visit in one month's for reevaluation after repeating CT scan of the chest without contrast. She was advised to call immediately if she has any concerning symptoms in the interval.  Disclaimer: This note was dictated with voice recognition software. Similar sounding words can inadvertently be transcribed and may be missed upon review. Eilleen Kempf., MD 11/16/2014

## 2014-11-16 NOTE — Telephone Encounter (Signed)
Pt confirmed labs/ov/inj per 02/03 POF,  gave pt AVS..... KJ

## 2014-11-16 NOTE — Patient Instructions (Signed)
Continue Tarceva 100 mg by mouth daily Take Imodium as instructed as needed for diarrhea Follow-up in one month with a CT of your chest to reevaluate your disease

## 2014-11-16 NOTE — Patient Instructions (Addendum)
Implanted Port Home Guide An implanted port is a type of central line that is placed under the skin. Central lines are used to provide IV access when treatment or nutrition needs to be given through a person's veins. Implanted ports are used for long-term IV access. An implanted port may be placed because:   You need IV medicine that would be irritating to the small veins in your hands or arms.   You need long-term IV medicines, such as antibiotics.   You need IV nutrition for a long period.   You need frequent blood draws for lab tests.   You need dialysis.  Implanted ports are usually placed in the chest area, but they can also be placed in the upper arm, the abdomen, or the leg. An implanted port has two main parts:   Reservoir. The reservoir is round and will appear as a small, raised area under your skin. The reservoir is the part where a needle is inserted to give medicines or draw blood.   Catheter. The catheter is a thin, flexible tube that extends from the reservoir. The catheter is placed into a large vein. Medicine that is inserted into the reservoir goes into the catheter and then into the vein.  HOW WILL I CARE FOR MY INCISION SITE? Do not get the incision site wet. Bathe or shower as directed by your health care provider.  HOW IS MY PORT ACCESSED? Special steps must be taken to access the port:   Before the port is accessed, a numbing cream can be placed on the skin. This helps numb the skin over the port site.   Your health care provider uses a sterile technique to access the port.  Your health care provider must put on a mask and sterile gloves.  The skin over your port is cleaned carefully with an antiseptic and allowed to dry.  The port is gently pinched between sterile gloves, and a needle is inserted into the port.  Only "non-coring" port needles should be used to access the port. Once the port is accessed, a blood return should be checked. This helps  ensure that the port is in the vein and is not clogged.   If your port needs to remain accessed for a constant infusion, a clear (transparent) bandage will be placed over the needle site. The bandage and needle will need to be changed every week, or as directed by your health care provider.   Keep the bandage covering the needle clean and dry. Do not get it wet. Follow your health care provider's instructions on how to take a shower or bath while the port is accessed.   If your port does not need to stay accessed, no bandage is needed over the port.  WHAT IS FLUSHING? Flushing helps keep the port from getting clogged. Follow your health care provider's instructions on how and when to flush the port. Ports are usually flushed with saline solution or a medicine called heparin. The need for flushing will depend on how the port is used.   If the port is used for intermittent medicines or blood draws, the port will need to be flushed:   After medicines have been given.   After blood has been drawn.   As part of routine maintenance.   If a constant infusion is running, the port may not need to be flushed.  HOW LONG WILL MY PORT STAY IMPLANTED? The port can stay in for as long as your health care   provider thinks it is needed. When it is time for the port to come out, surgery will be done to remove it. The procedure is similar to the one performed when the port was put in.  WHEN SHOULD I SEEK IMMEDIATE MEDICAL CARE? When you have an implanted port, you should seek immediate medical care if:   You notice a bad smell coming from the incision site.   You have swelling, redness, or drainage at the incision site.   You have more swelling or pain at the port site or the surrounding area.   You have a fever that is not controlled with medicine. Document Released: 09/30/2005 Document Revised: 07/21/2013 Document Reviewed: 06/07/2013 [facility] Patient Information 2015 [place], [place]. This  information is not intended to replace advice given to you by your health care provider. Make sure you discuss any questions you have with your health care provider.   Vitamin B12 Injections Every person needs vitamin B12. A deficiency develops when the body does not get enough of it. One way to overcome this is by getting B12 shots (injections). A B12 shot puts the vitamin directly into muscle tissue. This avoids any problems your body might have in absorbing it from food or a pill. In some people, the body has trouble using the vitamin correctly. This can cause a B12 deficiency. Not consuming enough of the vitamin can also cause a deficiency. Getting enough vitamin B12 can be hard for elderly people. Sometimes, they do not eat a well-balanced diet. The elderly are also more likely than younger people to have medical conditions or take medications that can lead to a deficiency. WHAT DOES VITAMIN B12 DO? Vitamin B12 does many things to help the body work right:  It helps the body make healthy red blood cells.  It helps maintain nerve cells.  It is involved in the body's process of converting food into energy (metabolism).  It is needed to make the genetic material in all cells (DNA). VITAMIN B12 FOOD SOURCES Most people get plenty of vitamin B12 through the foods they eat. It is present in:  Meat, fish, poultry, and eggs.  Milk and milk products.  It also is added when certain foods are made, including some breads, cereals and yogurts. The food is then called "fortified". CAUSES The most common causes of vitamin B12 deficiency are:  Pernicious anemia. The condition develops when the body cannot make enough healthy red blood cells. This stems from a lack of a protein made in the stomach (intrinsic factor). People without this protein cannot absorb enough vitamin B12 from food.  Malabsorption. This is when the body cannot absorb the vitamin. It can be caused by:  Pernicious  anemia.  Surgery to remove part or all of the stomach can lead to malabsorption. Removal of part or all of the small intestine can also cause malabsorption.  Vegetarian diet. People who are strict about not eating foods from animals could have trouble taking in enough vitamin B12 from diet alone.  Medications. Some medicines have been linked to B12 deficiency, such as Metformin (a drug prescribed for type 2 diabetes). Long-term use of stomach acid suppressants also can keep the vitamin from being absorbed.  Intestinal problems such as inflammatory bowel disease. If there are problems in the digestive tract, vitamin B12 may not be absorbed in good enough amounts. SYMPTOMS People who do not get enough B12 can develop problems. These can include:  Anemia. This is when the body has too few red  blood cells. Red blood cells carry oxygen to the rest of the body. Without a healthy supply of red blood cells, people can feel:  Tired (fatigued).  Weak.  Severe anemia can cause:  Shortness of breath.  Dizziness.  Rapid heart rate.  Paleness.  Other Vitamin B12 deficiency symptoms include:  Diarrhea.  Numbness or tingling in the hands or feet.  Loss of appetite.  Confusion.  Sores on the tongue or in the mouth. LET YOUR CAREGIVER KNOW ABOUT:  Any allergies. It is very important to know if you are allergic or sensitive to cobalt. Vitamin B12 contains cobalt.  Any history of kidney disease.  All medications you are taking. Include prescription and over-the-counter medicines, herbs and creams.  Whether you are pregnant or breast-feeding.  If you have Leber's disease, a hereditary eye condition, vitamin B12 could make it worse. RISKS AND COMPLICATIONS Reactions to an injection are usually temporary. They might include:  Pain at the injection site.  Redness, swelling or tenderness at the site.  Headache, dizziness or weakness.  Nausea, upset stomach or diarrhea.  Numbness  or tingling.  Fever.  Joint pain.  Itching or rash. If a reaction does not go away in a short while, talk with your healthcare provider. A change in the way the shots are given, or where they are given, might need to be made. BEFORE AN INJECTION To decide whether B12 injections are right for you, your healthcare provider will probably:  Ask about your medical history.  Ask questions about your diet.  Ask about symptoms such as:  Have you felt weak?  Do you feel unusually tired?  Do you get dizzy?  Order blood tests. These may include a test to:  Check the level of red cells in your blood.  Measure B12 levels.  Check for the presence of intrinsic factor. VITAMIN B12 INJECTIONS How often you will need a vitamin B12 injection will depend on how severe your deficiency is. This also will affect how long you will need to get them. People with pernicious anemia usually get injections for their entire life. Others might get them for a shorter period. For many people, injections are given daily or weekly for several weeks. Then, once B12 levels are normal, injections are given just once a month. If the cause of the deficiency can be fixed, the injections can be stopped. Talk with your healthcare provider about what you should expect. For an injection:  The injection site will be cleaned with an alcohol swab.  Your healthcare provider will insert a needle directly into a muscle. Most any muscle can be used. Most often, an arm muscle is used. A buttocks muscle can also be used. Many people say shots in that area are less painful.  A small adhesive bandage may be put over the injection site. It usually can be taken off in an hour or less. Injections can be given by your healthcare provider. In some cases, family members give them. Sometimes, people give them to themselves. Talk with your healthcare provider about what would be best for you. If someone other than your healthcare provider  will be giving the shots, the person will need to be trained to give them correctly. HOME CARE INSTRUCTIONS   You can remove the adhesive bandage within an hour of getting a shot.  You should be able to go about your normal activities right away.  Avoid drinking large amounts of alcohol while taking vitamin B12 shots. Alcohol can interfere  with the body's use of the vitamin. SEEK MEDICAL CARE IF:   Pain, redness, swelling or tenderness at the injection site does not get better or gets worse.  Headache, dizziness or weakness does not go away.  You develop a fever of more than 100.5 F (38.1 C). SEEK IMMEDIATE MEDICAL CARE IF:   You have chest pain.  You develop shortness of breath.  You have muscle weakness that gets worse.  You develop numbness, weakness or tingling on one side or one area of the body.  You have symptoms of an allergic reaction, such as:  Hives.  Difficulty breathing.  Swelling of the lips, face, tongue or throat.  You develop a fever of more than 102.0 F (38.9 C). MAKE SURE YOU:   Understand these instructions.  Will watch your condition.  Will get help right away if you are not doing well or get worse. Document Released: 12/27/2008 Document Revised: 12/23/2011 Document Reviewed: 12/27/2008 [facility] Patient Information 2015 [place], [place]. This information is not intended to replace advice given to you by your health care provider. Make sure you discuss any questions you have with your health care provider.

## 2014-12-09 ENCOUNTER — Ambulatory Visit (HOSPITAL_COMMUNITY)
Admission: RE | Admit: 2014-12-09 | Discharge: 2014-12-09 | Disposition: A | Discharge status: Home / Self Care | Payer: Medicare Other | Source: Ambulatory Visit | Attending: Physician Assistant | Admitting: Physician Assistant

## 2014-12-09 ENCOUNTER — Encounter (HOSPITAL_COMMUNITY): Payer: Self-pay

## 2014-12-09 DIAGNOSIS — Z87891 Personal history of nicotine dependence: Secondary | ICD-10-CM | POA: Insufficient documentation

## 2014-12-09 DIAGNOSIS — R05 Cough: Secondary | ICD-10-CM | POA: Diagnosis not present

## 2014-12-09 DIAGNOSIS — Z08 Encounter for follow-up examination after completed treatment for malignant neoplasm: Secondary | ICD-10-CM | POA: Diagnosis not present

## 2014-12-09 DIAGNOSIS — C3490 Malignant neoplasm of unspecified part of unspecified bronchus or lung: Secondary | ICD-10-CM

## 2014-12-15 ENCOUNTER — Telehealth: Payer: Self-pay | Admitting: Internal Medicine

## 2014-12-15 ENCOUNTER — Ambulatory Visit: Payer: Medicare Other

## 2014-12-15 ENCOUNTER — Ambulatory Visit (HOSPITAL_BASED_OUTPATIENT_CLINIC_OR_DEPARTMENT_OTHER): Payer: Medicare Other | Admitting: Internal Medicine

## 2014-12-15 ENCOUNTER — Other Ambulatory Visit (HOSPITAL_BASED_OUTPATIENT_CLINIC_OR_DEPARTMENT_OTHER): Payer: Medicare Other

## 2014-12-15 ENCOUNTER — Encounter: Payer: Self-pay | Admitting: Internal Medicine

## 2014-12-15 ENCOUNTER — Other Ambulatory Visit: Payer: Medicare Other

## 2014-12-15 ENCOUNTER — Ambulatory Visit (HOSPITAL_BASED_OUTPATIENT_CLINIC_OR_DEPARTMENT_OTHER): Payer: Medicare Other

## 2014-12-15 VITALS — BP 160/73 | HR 89 | Temp 97.6°F | Resp 17 | Ht 65.0 in | Wt 139.3 lb

## 2014-12-15 DIAGNOSIS — C3412 Malignant neoplasm of upper lobe, left bronchus or lung: Secondary | ICD-10-CM | POA: Diagnosis not present

## 2014-12-15 DIAGNOSIS — R21 Rash and other nonspecific skin eruption: Secondary | ICD-10-CM

## 2014-12-15 DIAGNOSIS — Z95828 Presence of other vascular implants and grafts: Secondary | ICD-10-CM

## 2014-12-15 DIAGNOSIS — R197 Diarrhea, unspecified: Secondary | ICD-10-CM | POA: Diagnosis not present

## 2014-12-15 DIAGNOSIS — C3411 Malignant neoplasm of upper lobe, right bronchus or lung: Secondary | ICD-10-CM

## 2014-12-15 DIAGNOSIS — E538 Deficiency of other specified B group vitamins: Secondary | ICD-10-CM

## 2014-12-15 DIAGNOSIS — C3481 Malignant neoplasm of overlapping sites of right bronchus and lung: Secondary | ICD-10-CM

## 2014-12-15 DIAGNOSIS — C3490 Malignant neoplasm of unspecified part of unspecified bronchus or lung: Secondary | ICD-10-CM

## 2014-12-15 LAB — CBC WITH DIFFERENTIAL/PLATELET
BASO%: 0.3 % (ref 0.0–2.0)
BASOS ABS: 0 10*3/uL (ref 0.0–0.1)
EOS ABS: 0.2 10*3/uL (ref 0.0–0.5)
EOS%: 2.2 % (ref 0.0–7.0)
HCT: 37.8 % (ref 34.8–46.6)
HEMOGLOBIN: 12.5 g/dL (ref 11.6–15.9)
LYMPH#: 2.2 10*3/uL (ref 0.9–3.3)
LYMPH%: 23.3 % (ref 14.0–49.7)
MCH: 29.5 pg (ref 25.1–34.0)
MCHC: 33.1 g/dL (ref 31.5–36.0)
MCV: 89.2 fL (ref 79.5–101.0)
MONO#: 0.7 10*3/uL (ref 0.1–0.9)
MONO%: 7.3 % (ref 0.0–14.0)
NEUT#: 6.3 10*3/uL (ref 1.5–6.5)
NEUT%: 66.9 % (ref 38.4–76.8)
Platelets: 175 10*3/uL (ref 145–400)
RBC: 4.24 10*6/uL (ref 3.70–5.45)
RDW: 15.1 % — ABNORMAL HIGH (ref 11.2–14.5)
WBC: 9.5 10*3/uL (ref 3.9–10.3)

## 2014-12-15 LAB — COMPREHENSIVE METABOLIC PANEL (CC13)
ALT: 11 U/L (ref 0–55)
AST: 15 U/L (ref 5–34)
Albumin: 3.9 g/dL (ref 3.5–5.0)
Alkaline Phosphatase: 93 U/L (ref 40–150)
Anion Gap: 9 mEq/L (ref 3–11)
BUN: 11.1 mg/dL (ref 7.0–26.0)
CALCIUM: 9.8 mg/dL (ref 8.4–10.4)
CHLORIDE: 109 meq/L (ref 98–109)
CO2: 24 meq/L (ref 22–29)
Creatinine: 1.2 mg/dL — ABNORMAL HIGH (ref 0.6–1.1)
EGFR: 51 mL/min/{1.73_m2} — ABNORMAL LOW (ref >90)
Glucose: 88 mg/dl (ref 70–140)
POTASSIUM: 3.9 meq/L (ref 3.5–5.1)
Sodium: 142 mEq/L (ref 136–145)
TOTAL PROTEIN: 7 g/dL (ref 6.4–8.3)
Total Bilirubin: 0.87 mg/dL (ref 0.20–1.20)

## 2014-12-15 MED ORDER — HEPARIN SOD (PORK) LOCK FLUSH 100 UNIT/ML IV SOLN
500.0000 [IU] | Freq: Once | INTRAVENOUS | Status: AC
  Administered 2014-12-15: 500 [IU] via INTRAVENOUS
  Filled 2014-12-15: qty 5

## 2014-12-15 MED ORDER — CYANOCOBALAMIN 1000 MCG/ML IJ SOLN
1000.0000 ug | Freq: Once | INTRAMUSCULAR | Status: AC
  Administered 2014-12-15: 1000 ug via INTRAMUSCULAR

## 2014-12-15 MED ORDER — SODIUM CHLORIDE 0.9 % IJ SOLN
10.0000 mL | INTRAMUSCULAR | Status: DC | PRN
  Administered 2014-12-15: 10 mL via INTRAVENOUS
  Filled 2014-12-15: qty 10

## 2014-12-15 NOTE — Progress Notes (Signed)
[facility] [phone]   Fax:([phone]) [phone]  OFFICE PROGRESS NOTE  [name], NP [phone] [place] [address]  DIAGNOSIS AND STAGE:  #1 Recurrent non-small cell lung cancer consistent with adenocarcinoma. This was initially diagnosed as stage IB (T2a N0 M0) well-differentiated adenocarcinoma of bronchioalveolar type in December 2010.  #2 Acute pulmonary embolism in the right lower lobe lobar, segmental and subsegmental sized pulmonary arteries, diagnosed on 11/04/2011.   PRIOR THERAPY:  1.Status post right upper lobectomy on September 28, 2009 under the care of Dr. Arlyce Dice. The patient refused adjuvant chemotherapy at that time. She had disease recurrence in June of 2012.  2.Status post 4 cycles of systemic chemotherapy with carboplatin for an AUC of 6 and paclitaxel at 200 mg per meter squared and Avastin at 15 mg/kg according to the ECOG protocol #5508.  3. Chemotherapy with Avastin 15 mg/kg according to the ECOG protocol #5508 status post 32 cycles. Last cycle was given on 06/30/2013 discontinued secondary to disease progression and persistent proteinuria. 4. Tarceva 150 mg by mouth daily, started 08/09/2013, status post 10 months of treatment.  CURRENT THERAPY: Tarceva 100 mg by mouth daily started 07/02/2014  CHEMOTHERAPY INTENT: Palliative.  CURRENT # OF CHEMOTHERAPY CYCLES: 6 CURRENT ANTIEMETICS: Compazine  CURRENT SMOKING STATUS: Non-smoker  ORAL CHEMOTHERAPY AND CONSENT: Tarceva and consent was signed 07/21/2013.  CURRENT BISPHOSPHONATES USE: None  PAIN MANAGEMENT: No pain  NARCOTICS INDUCED CONSTIPATION: No constipation.  LIVING WILL AND CODE STATUS: Full code   INTERVAL HISTORY: [name] 74 y.o. female returns to the clinic today for routine monthly followup visit. The patient is feeling fine today except for mild skin rash mainly on the face and few episodes of diarrhea. She is tolerating her current dose of  Tarceva 100 mg by mouth daily fairly well. She denied having any significant nausea or vomiting. She denied having any chest pain but continues to have shortness of breath with exertion with no hemoptysis. The patient denied having any fever or chills. She had repeat CT scan of the chest performed recently and she is here for evaluation and discussion of her scan results.  MEDICAL HISTORY: Past Medical History  Diagnosis Date  . Hypertension   . Hypothyroidism   . Hypercholesterolemia   . Glaucoma   . Hip pain 09/02/2011  . Foot pain 09/02/2011  . Lung cancer 03/08/2011    recurrent    ALLERGIES:  is allergic to cymbalta; fish allergy; amitriptyline; codeine; and sulfa antibiotics.  MEDICATIONS:  Current Outpatient Prescriptions  Medication Sig Dispense Refill  . amLODipine (NORVASC) 5 MG tablet Take 10 mg by mouth daily. Per [name], NP    . clindamycin (CLEOCIN T) 1 % lotion Apply topically 2 (two) times daily. Apply twice daily as needed 120 mL 0  . clonazePAM (KLONOPIN) 0.5 MG tablet Take 0.5 mg by mouth 2 (two) times daily as needed. Per [name], NP    . cyanocobalamin (,VITAMIN B-12,) 1000 MCG/ML injection Inject 1,000 mcg into the muscle every 30 (thirty) days.    . diphenoxylate-atropine (LOMOTIL) 2.5-0.025 MG per tablet Take 1 to 2 tablets by mouth every 6 hours as needed for diarrhea (Patient not taking: Reported on 11/16/2014) 30 tablet 1  . dorzolamide-timolol (COSOPT) 22.3-6.8 MG/ML ophthalmic solution 2 drops 2 (two) times daily. Per [name], NP     . levothyroxine (SYNTHROID, LEVOTHROID) 88 MCG tablet Take 88 mcg by mouth daily before breakfast.    .  lidocaine-prilocaine (EMLA) cream Apply 1 application topically as needed (prior to port access).     Marland Kitchen lisinopril (PRINIVIL,ZESTRIL) 40 MG tablet Take 40 mg by mouth daily. Per [name], NP     . loperamide (IMODIUM) 2 MG capsule Take 1 capsule (2 mg total) by mouth every 4 (four) hours as needed for  diarrhea or loose stools. 30 capsule 0  . ondansetron (ZOFRAN) 4 MG tablet Take 1 tablet (4 mg total) by mouth every 8 (eight) hours as needed for nausea or vomiting. (Patient not taking: Reported on 11/16/2014) 20 tablet 0  . pravastatin (PRAVACHOL) 80 MG tablet Take 80 mg by mouth daily.     . prochlorperazine (COMPAZINE) 10 MG tablet Take 10 mg by mouth every 6 (six) hours as needed (for nausea).     . TARCEVA 100 MG tablet TAKE 1 TABLET BY MOUTH DAILY ON AN EMPTY STOMACH 30 tablet 0  . TARCEVA 100 MG tablet TAKE 1 TABLET BY MOUTH DAILY ON AN EMPTY STOMACH 30 tablet 1   No current facility-administered medications for this visit.   Facility-Administered Medications Ordered in Other Visits  Medication Dose Route Frequency Provider Last Rate Last Dose  . sodium chloride 0.9 % injection 10 mL  10 mL Intravenous PRN Curt Bears, MD   10 mL at 12/15/14 1115    SURGICAL HISTORY:  Past Surgical History  Procedure Laterality Date  . Video assisted thoracoscopy  09/28/2009  . Thoracotomy  09/28/2009    mini  . Lung lobectomy  09/28/2009    right upper    REVIEW OF SYSTEMS:  Constitutional: positive for fatigue Eyes: negative Ears, nose, mouth, throat, and face: negative Respiratory: negative Cardiovascular: negative Gastrointestinal: Intermittent diarrhea Genitourinary: Increased urine frequency Integument/breast: Acne-like skin rash on the face especially the nose area. Hematologic/lymphatic: negative Musculoskeletal:negative Neurological: positive for paresthesia Behavioral/Psych: negative Endocrine: negative Allergic/Immunologic: negative   PHYSICAL EXAMINATION: General appearance: alert, cooperative, fatigued and no distress Head: Normocephalic, without obvious abnormality, atraumatic Neck: no adenopathy, no JVD, supple, symmetrical, trachea midline and thyroid not enlarged, symmetric, no tenderness/mass/nodules Lymph nodes: Cervical, supraclavicular, and axillary nodes  normal. Resp: clear to auscultation bilaterally Back: symmetric, no curvature. ROM normal. No CVA tenderness. Cardio: regular rate and rhythm, S1, S2 normal, no murmur, click, rub or gallop GI: soft, non-tender; bowel sounds normal; no masses,  no organomegaly Extremities: extremities normal, atraumatic, no cyanosis or edema Neurologic: Alert and oriented X 3, normal strength and tone. Normal symmetric reflexes. Normal coordination and gait  ECOG PERFORMANCE STATUS: 1 - Symptomatic but completely ambulatory  There were no vitals taken for this visit.  LABORATORY DATA: Lab Results  Component Value Date   WBC 9.5 12/15/2014   HGB 12.5 12/15/2014   HCT 37.8 12/15/2014   MCV 89.2 12/15/2014   PLT 175 12/15/2014      Chemistry      Component Value Date/Time   NA 141 11/16/2014 1329   NA 144 05/25/2014 0332   NA 142 03/08/2011 0754   K 4.0 11/16/2014 1329   K 3.7 05/25/2014 0332   K 4.2 03/08/2011 0754   CL 109 05/25/2014 0332   CL 107 03/30/2013 1036   CL 102 03/08/2011 0754   CO2 24 11/16/2014 1329   CO2 25 05/25/2014 0332   CO2 27 03/08/2011 0754   BUN 14.1 11/16/2014 1329   BUN 5* 05/25/2014 0332   BUN 9 03/08/2011 0754   CREATININE 1.4* 11/16/2014 1329   CREATININE 1.15* 05/25/2014 0086  CREATININE 1.0 03/08/2011 0754      Component Value Date/Time   CALCIUM 8.9 11/16/2014 1329   CALCIUM 9.2 05/25/2014 0332   CALCIUM 9.5 03/08/2011 0754   ALKPHOS 84 11/16/2014 1329   ALKPHOS 67 05/25/2014 0332   ALKPHOS 59 03/08/2011 0754   AST 15 11/16/2014 1329   AST 17 05/25/2014 0332   AST 19 03/08/2011 0754   ALT 13 11/16/2014 1329   ALT 9 05/25/2014 0332   ALT 18 03/08/2011 0754   BILITOT 0.54 11/16/2014 1329   BILITOT 0.4 05/25/2014 0332   BILITOT 0.50 03/08/2011 0754     RADIOLOGY RESULTS: Ct Chest Wo Contrast  12/09/2014   CLINICAL DATA:  Followup lung cancer.  Former smoker.  Cough.  EXAM: CT CHEST WITHOUT CONTRAST  TECHNIQUE: Multidetector CT imaging of the  chest was performed following the standard protocol without IV contrast.  COMPARISON:  08/25/2014  FINDINGS: Mediastinum: Heart size is normal. There is no pericardial effusion identified. Right chest wall port a catheter is noted with tip in the cavoatrial junction. Calcified atherosclerotic plaque involves the thoracic aorta as well as the LAD, left circumflex and RCA coronary arteries. Calcified left hilar lymph nodes are identified. No mediastinal or hilar adenopathy identified.  Lungs/Pleura: There is no pleural effusion identified. Moderate to advanced changes of centrilobular and paraseptal emphysema identified. Multiple mixed sub solid and solid lesions are identified in both lungs. Index part solid lesion within the left lower lobe is again identified. This measures 5 cm with a peripheral solid component measuring 2.2 cm, image 24/ series 2. Previously this lesion measured 5.3 cm and had a peripheral solid component measuring 2 cm. Index part solid nodule within the medial right lower lobe measures 3.1 cm and has a solid component measuring 1.2 cm. Previously this measured 3 cm and had a solid component measuring 1.1 cm. Pure ground-glass nodule within the right base is stable measuring 1 cm, image 39/series 5. There is a calcified granuloma noted in the left upper lobe. Solid nodule within the right upper lung is stable measuring 7 mm, image 14/series 5. Similar appearance of left apical scar.  Upper Abdomen: Calcified granulomas are noted within the liver and spleen. Similar appearance of 5 mm low attenuation structure within the right lobe of liver, image 44/series 2.  Musculoskeletal: Review of the visualized osseous structures is negative for aggressive lytic or sclerotic bone lesions.  IMPRESSION: 1. No acute findings. 2. Overall stable CT appearance of the chest with multifocal mixed sub solid and solid lesions in both lungs. These are not significantly changed when compared with 08/25/2014. See  measurements provided above. 3. Stable solid nodule in the right upper lobe measuring 7 mm.   Electronically Signed   By: Kerby Moors M.D.   On: 12/09/2014 15:12    ASSESSMENT AND PLAN: This is a very pleasant 74 years old [place] American female with recurrent non-small cell lung cancer most recently treated with maintenance Avastin status post 32 cycles. She is currently on treatment with single agent Tarceva 150 mg by mouth daily status post 10 months and tolerating her treatment fairly well except for mild skin rash and occasional diarrhea. She is tolerating her current treatment with Tarceva 100 milligrams by mouth daily fairly well. She status post 6 months of treatment. The recent CT scan of the chest showed no evidence for disease progression. I discussed the scan results with the patient today. I recommended for her to continue Tarceva with the same dose.  For diarrhea, she will continue on Imodium when necessary. For the skin rash, she will continue to apply the clindamycin 1% lotion to the skin rash area twice a day. She will come back for followup visit in one month for reevaluation with repeat blood work. The patient was advised to call immediately if she has any concerning symptoms in the interval. The patient voices understanding of current disease status and treatment options and is in agreement with the current care plan.  All questions were answered. The patient knows to call the clinic with any problems, questions or concerns. We can certainly see the patient much sooner if necessary.  Disclaimer: This note was dictated with voice recognition software. Similar sounding words can inadvertently be transcribed and may not be corrected upon review.

## 2014-12-15 NOTE — Patient Instructions (Signed)
Vitamin B12 Injections Every person needs vitamin B12. A deficiency develops when the body does not get enough of it. One way to overcome this is by getting B12 shots (injections). A B12 shot puts the vitamin directly into muscle tissue. This avoids any problems your body might have in absorbing it from food or a pill. In some people, the body has trouble using the vitamin correctly. This can cause a B12 deficiency. Not consuming enough of the vitamin can also cause a deficiency. Getting enough vitamin B12 can be hard for elderly people. Sometimes, they do not eat a well-balanced diet. The elderly are also more likely than younger people to have medical conditions or take medications that can lead to a deficiency. WHAT DOES VITAMIN B12 DO? Vitamin B12 does many things to help the body work right:  It helps the body make healthy red blood cells.  It helps maintain nerve cells.  It is involved in the body's process of converting food into energy (metabolism).  It is needed to make the genetic material in all cells (DNA). VITAMIN B12 FOOD SOURCES Most people get plenty of vitamin B12 through the foods they eat. It is present in:  Meat, fish, poultry, and eggs.  Milk and milk products.  It also is added when certain foods are made, including some breads, cereals and yogurts. The food is then called "fortified". CAUSES The most common causes of vitamin B12 deficiency are:  Pernicious anemia. The condition develops when the body cannot make enough healthy red blood cells. This stems from a lack of a protein made in the stomach (intrinsic factor). People without this protein cannot absorb enough vitamin B12 from food.  Malabsorption. This is when the body cannot absorb the vitamin. It can be caused by:  Pernicious anemia.  Surgery to remove part or all of the stomach can lead to malabsorption. Removal of part or all of the small intestine can also cause malabsorption.  Vegetarian diet.  People who are strict about not eating foods from animals could have trouble taking in enough vitamin B12 from diet alone.  Medications. Some medicines have been linked to B12 deficiency, such as Metformin (a drug prescribed for type 2 diabetes). Long-term use of stomach acid suppressants also can keep the vitamin from being absorbed.  Intestinal problems such as inflammatory bowel disease. If there are problems in the digestive tract, vitamin B12 may not be absorbed in good enough amounts. SYMPTOMS People who do not get enough B12 can develop problems. These can include:  Anemia. This is when the body has too few red blood cells. Red blood cells carry oxygen to the rest of the body. Without a healthy supply of red blood cells, people can feel:  Tired (fatigued).  Weak.  Severe anemia can cause:  Shortness of breath.  Dizziness.  Rapid heart rate.  Paleness.  Other Vitamin B12 deficiency symptoms include:  Diarrhea.  Numbness or tingling in the hands or feet.  Loss of appetite.  Confusion.  Sores on the tongue or in the mouth. LET YOUR CAREGIVER KNOW ABOUT:  Any allergies. It is very important to know if you are allergic or sensitive to cobalt. Vitamin B12 contains cobalt.  Any history of kidney disease.  All medications you are taking. Include prescription and over-the-counter medicines, herbs and creams.  Whether you are pregnant or breast-feeding.  If you have Leber's disease, a hereditary eye condition, vitamin B12 could make it worse. RISKS AND COMPLICATIONS Reactions to an injection are   usually temporary. They might include:  Pain at the injection site.  Redness, swelling or tenderness at the site.  Headache, dizziness or weakness.  Nausea, upset stomach or diarrhea.  Numbness or tingling.  Fever.  Joint pain.  Itching or rash. If a reaction does not go away in a short while, talk with your healthcare provider. A change in the way the shots are  given, or where they are given, might need to be made. BEFORE AN INJECTION To decide whether B12 injections are right for you, your healthcare provider will probably:  Ask about your medical history.  Ask questions about your diet.  Ask about symptoms such as:  Have you felt weak?  Do you feel unusually tired?  Do you get dizzy?  Order blood tests. These may include a test to:  Check the level of red cells in your blood.  Measure B12 levels.  Check for the presence of intrinsic factor. VITAMIN B12 INJECTIONS How often you will need a vitamin B12 injection will depend on how severe your deficiency is. This also will affect how long you will need to get them. People with pernicious anemia usually get injections for their entire life. Others might get them for a shorter period. For many people, injections are given daily or weekly for several weeks. Then, once B12 levels are normal, injections are given just once a month. If the cause of the deficiency can be fixed, the injections can be stopped. Talk with your healthcare provider about what you should expect. For an injection:  The injection site will be cleaned with an alcohol swab.  Your healthcare provider will insert a needle directly into a muscle. Most any muscle can be used. Most often, an arm muscle is used. A buttocks muscle can also be used. Many people say shots in that area are less painful.  A small adhesive bandage may be put over the injection site. It usually can be taken off in an hour or less. Injections can be given by your healthcare provider. In some cases, family members give them. Sometimes, people give them to themselves. Talk with your healthcare provider about what would be best for you. If someone other than your healthcare provider will be giving the shots, the person will need to be trained to give them correctly. HOME CARE INSTRUCTIONS   You can remove the adhesive bandage within an hour of getting a  shot.  You should be able to go about your normal activities right away.  Avoid drinking large amounts of alcohol while taking vitamin B12 shots. Alcohol can interfere with the body's use of the vitamin. SEEK MEDICAL CARE IF:   Pain, redness, swelling or tenderness at the injection site does not get better or gets worse.  Headache, dizziness or weakness does not go away.  You develop a fever of more than 100.5 F (38.1 C). SEEK IMMEDIATE MEDICAL CARE IF:   You have chest pain.  You develop shortness of breath.  You have muscle weakness that gets worse.  You develop numbness, weakness or tingling on one side or one area of the body.  You have symptoms of an allergic reaction, such as:  Hives.  Difficulty breathing.  Swelling of the lips, face, tongue or throat.  You develop a fever of more than 102.0 F (38.9 C). MAKE SURE YOU:   Understand these instructions.  Will watch your condition.  Will get help right away if you are not doing well or get worse. Document   Released: 12/27/2008 Document Revised: 12/23/2011 Document Reviewed: 12/27/2008 [facility] Patient Information 2015 [place], [place]. This information is not intended to replace advice given to you by your health care provider. Make sure you discuss any questions you have with your health care provider.   Implanted [facility] Guide An implanted port is a type of central line that is placed under the skin. Central lines are used to provide IV access when treatment or nutrition needs to be given through a person's veins. Implanted ports are used for long-term IV access. An implanted port may be placed because:   You need IV medicine that would be irritating to the small veins in your hands or arms.   You need long-term IV medicines, such as antibiotics.   You need IV nutrition for a long period.   You need frequent blood draws for lab tests.   You need dialysis.  Implanted ports are usually placed in the  chest area, but they can also be placed in the upper arm, the abdomen, or the leg. An implanted port has two main parts:   Reservoir. The reservoir is round and will appear as a small, raised area under your skin. The reservoir is the part where a needle is inserted to give medicines or draw blood.   Catheter. The catheter is a thin, flexible tube that extends from the reservoir. The catheter is placed into a large vein. Medicine that is inserted into the reservoir goes into the catheter and then into the vein.  HOW WILL I CARE FOR MY INCISION SITE? Do not get the incision site wet. Bathe or shower as directed by your health care provider.  HOW IS MY PORT ACCESSED? Special steps must be taken to access the port:   Before the port is accessed, a numbing cream can be placed on the skin. This helps numb the skin over the port site.   Your health care provider uses a sterile technique to access the port.  Your health care provider must put on a mask and sterile gloves.  The skin over your port is cleaned carefully with an antiseptic and allowed to dry.  The port is gently pinched between sterile gloves, and a needle is inserted into the port.  Only "non-coring" port needles should be used to access the port. Once the port is accessed, a blood return should be checked. This helps ensure that the port is in the vein and is not clogged.   If your port needs to remain accessed for a constant infusion, a clear (transparent) bandage will be placed over the needle site. The bandage and needle will need to be changed every week, or as directed by your health care provider.   Keep the bandage covering the needle clean and dry. Do not get it wet. Follow your health care provider's instructions on how to take a shower or bath while the port is accessed.   If your port does not need to stay accessed, no bandage is needed over the port.  WHAT IS FLUSHING? Flushing helps keep the port from getting  clogged. Follow your health care provider's instructions on how and when to flush the port. Ports are usually flushed with saline solution or a medicine called heparin. The need for flushing will depend on how the port is used.   If the port is used for intermittent medicines or blood draws, the port will need to be flushed:   After medicines have been given.   After blood  has been drawn.   As part of routine maintenance.   If a constant infusion is running, the port may not need to be flushed.  HOW LONG WILL MY PORT STAY IMPLANTED? The port can stay in for as long as your health care provider thinks it is needed. When it is time for the port to come out, surgery will be done to remove it. The procedure is similar to the one performed when the port was put in.  WHEN SHOULD I SEEK IMMEDIATE MEDICAL CARE? When you have an implanted port, you should seek immediate medical care if:   You notice a bad smell coming from the incision site.   You have swelling, redness, or drainage at the incision site.   You have more swelling or pain at the port site or the surrounding area.   You have a fever that is not controlled with medicine. Document Released: 09/30/2005 Document Revised: 07/21/2013 Document Reviewed: 06/07/2013 [facility] Patient Information 2015 [place], [place]. This information is not intended to replace advice given to you by your health care provider. Make sure you discuss any questions you have with your health care provider.

## 2014-12-15 NOTE — Telephone Encounter (Signed)
Gave avs & calendar for March. °

## 2014-12-30 DIAGNOSIS — K521 Toxic gastroenteritis and colitis: Secondary | ICD-10-CM | POA: Diagnosis not present

## 2014-12-30 DIAGNOSIS — N3000 Acute cystitis without hematuria: Secondary | ICD-10-CM | POA: Diagnosis not present

## 2014-12-30 DIAGNOSIS — R8299 Other abnormal findings in urine: Secondary | ICD-10-CM | POA: Diagnosis not present

## 2014-12-30 DIAGNOSIS — R350 Frequency of micturition: Secondary | ICD-10-CM | POA: Diagnosis not present

## 2015-01-05 ENCOUNTER — Other Ambulatory Visit: Payer: Self-pay | Admitting: Internal Medicine

## 2015-01-06 ENCOUNTER — Other Ambulatory Visit: Payer: Self-pay | Admitting: *Deleted

## 2015-01-06 MED ORDER — ERLOTINIB HCL 100 MG PO TABS: 100.0000 mg | ORAL_TABLET | Freq: Every day | ORAL | Status: DC

## 2015-01-11 ENCOUNTER — Encounter (HOSPITAL_COMMUNITY): Payer: Self-pay | Admitting: Emergency Medicine

## 2015-01-11 ENCOUNTER — Emergency Department (HOSPITAL_COMMUNITY)
Admission: EM | Admit: 2015-01-11 | Discharge: 2015-01-11 | Disposition: A | Discharge status: Home / Self Care | Payer: Medicare Other | Attending: Emergency Medicine | Admitting: Emergency Medicine

## 2015-01-11 DIAGNOSIS — I8002 Phlebitis and thrombophlebitis of superficial vessels of left lower extremity: Secondary | ICD-10-CM | POA: Insufficient documentation

## 2015-01-11 DIAGNOSIS — Z85118 Personal history of other malignant neoplasm of bronchus and lung: Secondary | ICD-10-CM | POA: Diagnosis not present

## 2015-01-11 DIAGNOSIS — Y998 Other external cause status: Secondary | ICD-10-CM | POA: Insufficient documentation

## 2015-01-11 DIAGNOSIS — S8992XA Unspecified injury of left lower leg, initial encounter: Secondary | ICD-10-CM | POA: Diagnosis not present

## 2015-01-11 DIAGNOSIS — I6529 Occlusion and stenosis of unspecified carotid artery: Secondary | ICD-10-CM | POA: Diagnosis not present

## 2015-01-11 DIAGNOSIS — E039 Hypothyroidism, unspecified: Secondary | ICD-10-CM | POA: Insufficient documentation

## 2015-01-11 DIAGNOSIS — W1839XA Other fall on same level, initial encounter: Secondary | ICD-10-CM | POA: Diagnosis not present

## 2015-01-11 DIAGNOSIS — Y9389 Activity, other specified: Secondary | ICD-10-CM | POA: Insufficient documentation

## 2015-01-11 DIAGNOSIS — H409 Unspecified glaucoma: Secondary | ICD-10-CM | POA: Insufficient documentation

## 2015-01-11 DIAGNOSIS — M8580 Other specified disorders of bone density and structure, unspecified site: Secondary | ICD-10-CM | POA: Diagnosis not present

## 2015-01-11 DIAGNOSIS — Z79899 Other long term (current) drug therapy: Secondary | ICD-10-CM | POA: Diagnosis not present

## 2015-01-11 DIAGNOSIS — I809 Phlebitis and thrombophlebitis of unspecified site: Secondary | ICD-10-CM

## 2015-01-11 DIAGNOSIS — Y9289 Other specified places as the place of occurrence of the external cause: Secondary | ICD-10-CM | POA: Diagnosis not present

## 2015-01-11 DIAGNOSIS — I10 Essential (primary) hypertension: Secondary | ICD-10-CM | POA: Diagnosis not present

## 2015-01-11 DIAGNOSIS — C3490 Malignant neoplasm of unspecified part of unspecified bronchus or lung: Secondary | ICD-10-CM | POA: Diagnosis not present

## 2015-01-11 DIAGNOSIS — M79609 Pain in unspecified limb: Secondary | ICD-10-CM | POA: Diagnosis not present

## 2015-01-11 LAB — URINALYSIS, ROUTINE W REFLEX MICROSCOPIC
BILIRUBIN URINE: NEGATIVE
GLUCOSE, UA: NEGATIVE mg/dL
Hgb urine dipstick: NEGATIVE
Ketones, ur: NEGATIVE mg/dL
Nitrite: NEGATIVE
PROTEIN: NEGATIVE mg/dL
Specific Gravity, Urine: 1.01 (ref 1.005–1.030)
UROBILINOGEN UA: 0.2 mg/dL (ref 0.0–1.0)
pH: 6 (ref 5.0–8.0)

## 2015-01-11 LAB — CBC WITH DIFFERENTIAL/PLATELET
Basophils Absolute: 0.1 10*3/uL (ref 0.0–0.1)
Basophils Relative: 1 % (ref 0–1)
Eosinophils Absolute: 0.3 10*3/uL (ref 0.0–0.7)
Eosinophils Relative: 2 % (ref 0–5)
HCT: 39.1 % (ref 36.0–46.0)
Hemoglobin: 12.4 g/dL (ref 12.0–15.0)
LYMPHS ABS: 1.9 10*3/uL (ref 0.7–4.0)
Lymphocytes Relative: 16 % (ref 12–46)
MCH: 28.5 pg (ref 26.0–34.0)
MCHC: 31.7 g/dL (ref 30.0–36.0)
MCV: 89.9 fL (ref 78.0–100.0)
MONO ABS: 0.6 10*3/uL (ref 0.1–1.0)
MONOS PCT: 5 % (ref 3–12)
NEUTROS ABS: 9.4 10*3/uL — AB (ref 1.7–7.7)
Neutrophils Relative %: 76 % (ref 43–77)
PLATELETS: 194 10*3/uL (ref 150–400)
RBC: 4.35 MIL/uL (ref 3.87–5.11)
RDW: 14.6 % (ref 11.5–15.5)
WBC: 12.2 10*3/uL — AB (ref 4.0–10.5)

## 2015-01-11 LAB — I-STAT CHEM 8, ED
BUN: 10 mg/dL (ref 6–23)
CALCIUM ION: 1.27 mmol/L (ref 1.13–1.30)
Chloride: 105 mmol/L (ref 96–112)
Creatinine, Ser: 1.2 mg/dL — ABNORMAL HIGH (ref 0.50–1.10)
Glucose, Bld: 78 mg/dL (ref 70–99)
HCT: 42 % (ref 36.0–46.0)
Hemoglobin: 14.3 g/dL (ref 12.0–15.0)
Potassium: 3.6 mmol/L (ref 3.5–5.1)
Sodium: 141 mmol/L (ref 135–145)
TCO2: 20 mmol/L (ref 0–100)

## 2015-01-11 LAB — URINE MICROSCOPIC-ADD ON

## 2015-01-11 MED ORDER — FLUCONAZOLE 200 MG PO TABS: 200.0000 mg | ORAL_TABLET | Freq: Every day | ORAL | Status: AC

## 2015-01-11 MED ORDER — NAPROXEN 375 MG PO TABS
375.0000 mg | ORAL_TABLET | Freq: Two times a day (BID) | ORAL | Status: DC
Start: 2015-01-11 — End: 2016-04-17

## 2015-01-11 NOTE — ED Provider Notes (Signed)
CSN: [id number]     Arrival date & time 01/11/15  1411 History   First MD Initiated Contact with Patient 01/11/15 1506     Chief Complaint  Patient presents with  . Fall  . Leg Swelling     (Consider location/radiation/quality/duration/timing/severity/associated sxs/prior Treatment) Patient is a 74 y.o. female presenting with leg pain. The history is provided by the patient.  Leg Pain Location:  Leg Time since incident:  2 days Injury: no   Leg location:  L lower leg Pain details:    Quality:  Aching   Radiates to:  Does not radiate   Severity:  Moderate   Onset quality:  Gradual   Duration:  2 days   Timing:  Constant   Progression:  Worsening Chronicity:  New Prior injury to area:  No Relieved by:  None tried Worsened by:  Nothing tried Ineffective treatments:  None tried Associated symptoms: swelling   Associated symptoms: no fever and no muscle weakness   Risk factors: no frequent fractures   Risk factors comment:  History of DVT years ago but not currently on anticoagulation thought to be due to lung cancer   Past Medical History  Diagnosis Date  . Hypertension   . Hypothyroidism   . Hypercholesterolemia   . Glaucoma   . Hip pain 09/02/2011  . Foot pain 09/02/2011  . Lung cancer 03/08/2011    recurrent   Past Surgical History  Procedure Laterality Date  . Video assisted thoracoscopy  09/28/2009  . Thoracotomy  09/28/2009    mini  . Lung lobectomy  09/28/2009    right upper   No family history on file. History  Substance Use Topics  . Smoking status: Former Smoker -- 1.00 packs/day for 50 years    Types: Cigarettes    Quit date: 09/28/2009  . Smokeless tobacco: Never Used  . Alcohol Use: Not on file   OB History    No data available     Review of Systems  Constitutional: Negative for fever.  All other systems reviewed and are negative.     Allergies  Cymbalta; Fish allergy; Amitriptyline; Codeine; and Sulfa antibiotics  Home Medications    Prior to Admission medications   Medication Sig Start Date End Date Taking? Authorizing Provider  acetaminophen (TYLENOL) 650 MG CR tablet Take 650 mg by mouth once.   Yes Historical Provider, MD  amLODipine (NORVASC) 5 MG tablet Take 10 mg by mouth daily. Per [name], NP   Yes Historical Provider, MD  clonazePAM (KLONOPIN) 0.5 MG tablet Take 0.5 mg by mouth 2 (two) times daily as needed. Per [name], NP   Yes Historical Provider, MD  cyanocobalamin (,VITAMIN B-12,) 1000 MCG/ML injection Inject 1,000 mcg into the muscle every 30 (thirty) days.   Yes Historical Provider, MD  dorzolamide-timolol (COSOPT) 22.3-6.8 MG/ML ophthalmic solution Place 2 drops into both eyes 2 (two) times daily. Per [name], NP   Yes Historical Provider, MD  erlotinib (TARCEVA) 100 MG tablet Take 1 tablet (100 mg total) by mouth daily. Take on an empty stomach 1 hour before meals or 2 hours after 01/06/15  Yes Adrena E Johnson, PA-C  fluticasone (FLONASE) 50 MCG/ACT nasal spray Place 1 spray into both nostrils daily as needed for allergies or rhinitis.   Yes Historical Provider, MD  levothyroxine (SYNTHROID, LEVOTHROID) 88 MCG tablet Take 88 mcg by mouth daily before breakfast.   Yes Historical Provider, MD  lidocaine-prilocaine (EMLA) cream Apply 1 application topically as needed (  prior to port access).  05/06/11  Yes Historical Provider, MD  lisinopril (PRINIVIL,ZESTRIL) 40 MG tablet Take 40 mg by mouth daily. Per [name], NP    Yes Historical Provider, MD  loperamide (IMODIUM) 2 MG capsule Take 1 capsule (2 mg total) by mouth every 4 (four) hours as needed for diarrhea or loose stools. 05/26/14  Yes Thurnell Lose, MD  prochlorperazine (COMPAZINE) 10 MG tablet Take 10 mg by mouth every 6 (six) hours as needed (for nausea).  04/18/11  Yes Historical Provider, MD  clindamycin (CLEOCIN T) 1 % lotion Apply topically 2 (two) times daily. Apply twice daily as needed Patient not taking: Reported on  01/11/2015 08/26/13   Curt Bears, MD  diphenoxylate-atropine (LOMOTIL) 2.5-0.025 MG per tablet Take 1 to 2 tablets by mouth every 6 hours as needed for diarrhea Patient not taking: Reported on 11/16/2014 01/18/14   Burnetta Sabin E Johnson, PA-C  ondansetron (ZOFRAN) 4 MG tablet Take 1 tablet (4 mg total) by mouth every 8 (eight) hours as needed for nausea or vomiting. Patient not taking: Reported on 11/16/2014 05/26/14   Thurnell Lose, MD   BP 166/71 mmHg  Pulse 105  Temp(Src) 97.7 F (36.5 C) (Oral)  Resp 16  SpO2 99% Physical Exam  Constitutional: She is oriented to person, place, and time. She appears well-developed and well-nourished. No distress.  HENT:  Head: Normocephalic and atraumatic.  Mouth/Throat: Oropharynx is clear and moist.  Eyes: Conjunctivae and EOM are normal. Pupils are equal, round, and reactive to light.  Neck: Normal range of motion. Neck supple.  Cardiovascular: Normal rate, regular rhythm and intact distal pulses.   No murmur heard. Pulmonary/Chest: Effort normal and breath sounds normal. No respiratory distress. She has no wheezes. She has no rales.  Abdominal: Soft. She exhibits no distension. There is no tenderness. There is no rebound and no guarding.  Musculoskeletal: Normal range of motion. She exhibits tenderness. She exhibits no edema.       Legs: Neurological: She is alert and oriented to person, place, and time.  Skin: Skin is warm and dry. No rash noted. There is erythema.  Psychiatric: She has a normal mood and affect. Her behavior is normal.  Nursing note and vitals reviewed.   ED Course  Procedures (including critical care time) Labs Review Labs Reviewed  CBC WITH DIFFERENTIAL/PLATELET - Abnormal; Notable for the following:    WBC 12.2 (*)    Neutro Abs 9.4 (*)    All other components within normal limits  URINALYSIS, ROUTINE W REFLEX MICROSCOPIC - Abnormal; Notable for the following:    Leukocytes, UA SMALL (*)    All other components within  normal limits  I-STAT CHEM 8, ED - Abnormal; Notable for the following:    Creatinine, Ser 1.20 (*)    All other components within normal limits  URINE MICROSCOPIC-ADD ON    Imaging Review No results found.   EKG Interpretation   Date/Time:  Wednesday January 11 2015 14:28:41 EDT Ventricular Rate:  103 PR Interval:  160 QRS Duration: 82 QT Interval:  339 QTC Calculation: 444 R Axis:   -39 Text Interpretation:  Sinus tachycardia Left axis deviation Low voltage,  precordial leads Borderline T abnormalities, anterior leads No significant  change since last tracing Confirmed by Maryan Rued  MD, Loree Fee ([id number]) on  01/11/2015 4:08:01 PM      MDM   Final diagnoses:  None    Patient presenting with 2 days of left distal medial tib-fib tenderness, erythema  and warmth. She denies any injury, infectious symptoms but does have some tenderness with palpation. On exam patient has an area of redness mild induration and tenderness. Concern for possible cellulitis versus thrombophlebitis versus DVT however lower suspicion for DVT. No area that I can appreciate an abscess.  Duplex done at bedside was negative for DVT but showed superficial thrombophlebitis which would explain sig patient's symptoms. Will place on anti-inflammatory and put patient in a compression hose.  Secondly patient states she's had issues with her urine for months now most recently within the last week she's had more urgency and frequency. She was initially started on Cipro by her doctor 2 weeks ago and was called and told to stop because the culture was negative. She is requesting repeat check of her urine today. Otherwise she is well appearing, ambulating without difficulty and has stable vital signs.  5:11 PM Patient with superficial thrombophlebitis by Doppler. This is the cause of her symptoms and at this time will not treat for cellulitis but will put in a compression hose and give anti-inflammatories. Secondly today  positive for the study but no overt signs of bacterial urinary tract infection. Will treat with Diflucan and encourage patient to follow-up with urology  Blanchie Dessert, MD 01/11/15 [phone]

## 2015-01-11 NOTE — ED Notes (Addendum)
Pt states that she fell night before last and then noticed pain and swelling yesterday.  Pt went to her doctor today and was referred here for scan bc pt has PMH blood clots.  Pt states that she does have shob with exertion but she has lung cancer.

## 2015-01-11 NOTE — Progress Notes (Signed)
*  PRELIMINARY RESULTS* Vascular Ultrasound Left lower extremity venous duplex has been completed.  Preliminary findings: Negative for DVT. Superficial thrombosis is noted in the left greater saphenous vein at the ankle.  Landry Mellow, RDMS, RVT  01/11/2015, 4:03 PM

## 2015-01-12 ENCOUNTER — Other Ambulatory Visit: Payer: Medicare Other

## 2015-01-12 ENCOUNTER — Ambulatory Visit: Payer: Medicare Other | Admitting: Physician Assistant

## 2015-01-12 ENCOUNTER — Telehealth: Payer: Self-pay | Admitting: Internal Medicine

## 2015-01-12 ENCOUNTER — Ambulatory Visit: Payer: Medicare Other

## 2015-01-12 NOTE — Telephone Encounter (Signed)
returned call and s.w. pt and r/s appt per pt request....pt ok and aware of new d.t

## 2015-01-19 ENCOUNTER — Ambulatory Visit (HOSPITAL_BASED_OUTPATIENT_CLINIC_OR_DEPARTMENT_OTHER): Payer: Medicare Other

## 2015-01-19 ENCOUNTER — Ambulatory Visit: Payer: Medicare Other

## 2015-01-19 ENCOUNTER — Telehealth: Payer: Self-pay | Admitting: Internal Medicine

## 2015-01-19 ENCOUNTER — Encounter: Payer: Self-pay | Admitting: Physician Assistant

## 2015-01-19 ENCOUNTER — Other Ambulatory Visit (HOSPITAL_BASED_OUTPATIENT_CLINIC_OR_DEPARTMENT_OTHER): Payer: Medicare Other

## 2015-01-19 ENCOUNTER — Ambulatory Visit (HOSPITAL_BASED_OUTPATIENT_CLINIC_OR_DEPARTMENT_OTHER): Payer: Medicare Other | Admitting: Physician Assistant

## 2015-01-19 VITALS — BP 146/66 | HR 80 | Temp 97.6°F | Resp 18 | Ht 65.0 in | Wt 139.2 lb

## 2015-01-19 DIAGNOSIS — E538 Deficiency of other specified B group vitamins: Secondary | ICD-10-CM

## 2015-01-19 DIAGNOSIS — R197 Diarrhea, unspecified: Secondary | ICD-10-CM

## 2015-01-19 DIAGNOSIS — C3490 Malignant neoplasm of unspecified part of unspecified bronchus or lung: Secondary | ICD-10-CM

## 2015-01-19 DIAGNOSIS — C3411 Malignant neoplasm of upper lobe, right bronchus or lung: Secondary | ICD-10-CM | POA: Diagnosis not present

## 2015-01-19 DIAGNOSIS — Z95828 Presence of other vascular implants and grafts: Secondary | ICD-10-CM

## 2015-01-19 DIAGNOSIS — R21 Rash and other nonspecific skin eruption: Secondary | ICD-10-CM | POA: Diagnosis not present

## 2015-01-19 LAB — COMPREHENSIVE METABOLIC PANEL (CC13)
ALK PHOS: 90 U/L (ref 40–150)
ALT: 10 U/L (ref 0–55)
AST: 11 U/L (ref 5–34)
Albumin: 3.3 g/dL — ABNORMAL LOW (ref 3.5–5.0)
Anion Gap: 8 mEq/L (ref 3–11)
BILIRUBIN TOTAL: 0.63 mg/dL (ref 0.20–1.20)
BUN: 12.9 mg/dL (ref 7.0–26.0)
CO2: 23 mEq/L (ref 22–29)
CREATININE: 1 mg/dL (ref 0.6–1.1)
Calcium: 9.2 mg/dL (ref 8.4–10.4)
Chloride: 110 mEq/L — ABNORMAL HIGH (ref 98–109)
EGFR: 63 mL/min/{1.73_m2} — ABNORMAL LOW (ref >90)
GLUCOSE: 93 mg/dL (ref 70–140)
Potassium: 4 mEq/L (ref 3.5–5.1)
Sodium: 141 mEq/L (ref 136–145)
Total Protein: 6.5 g/dL (ref 6.4–8.3)

## 2015-01-19 LAB — CBC WITH DIFFERENTIAL/PLATELET
BASO%: 0.3 % (ref 0.0–2.0)
BASOS ABS: 0 10*3/uL (ref 0.0–0.1)
EOS%: 2.4 % (ref 0.0–7.0)
Eosinophils Absolute: 0.2 10*3/uL (ref 0.0–0.5)
HCT: 35 % (ref 34.8–46.6)
HGB: 11.5 g/dL — ABNORMAL LOW (ref 11.6–15.9)
LYMPH%: 16.2 % (ref 14.0–49.7)
MCH: 29 pg (ref 25.1–34.0)
MCHC: 32.9 g/dL (ref 31.5–36.0)
MCV: 88.4 fL (ref 79.5–101.0)
MONO#: 0.6 10*3/uL (ref 0.1–0.9)
MONO%: 6.5 % (ref 0.0–14.0)
NEUT#: 7.3 10*3/uL — ABNORMAL HIGH (ref 1.5–6.5)
NEUT%: 74.6 % (ref 38.4–76.8)
Platelets: 235 10*3/uL (ref 145–400)
RBC: 3.96 10*6/uL (ref 3.70–5.45)
RDW: 14.4 % (ref 11.2–14.5)
WBC: 9.7 10*3/uL (ref 3.9–10.3)
lymph#: 1.6 10*3/uL (ref 0.9–3.3)

## 2015-01-19 MED ORDER — SODIUM CHLORIDE 0.9 % IJ SOLN
10.0000 mL | INTRAMUSCULAR | Status: DC | PRN
  Administered 2015-01-19: 10 mL via INTRAVENOUS
  Filled 2015-01-19: qty 10

## 2015-01-19 MED ORDER — HEPARIN SOD (PORK) LOCK FLUSH 100 UNIT/ML IV SOLN
500.0000 [IU] | Freq: Once | INTRAVENOUS | Status: AC
  Administered 2015-01-19: 500 [IU] via INTRAVENOUS
  Filled 2015-01-19: qty 5

## 2015-01-19 MED ORDER — CYANOCOBALAMIN 1000 MCG/ML IJ SOLN
1000.0000 ug | Freq: Once | INTRAMUSCULAR | Status: AC
  Administered 2015-01-19: 1000 ug via INTRAMUSCULAR

## 2015-01-19 NOTE — Progress Notes (Signed)
[place] [phone]   Fax:([phone]) [phone]  OFFICE PROGRESS NOTE  Terri Levels, NP [phone] [place] [address]  DIAGNOSIS AND STAGE:  #1 Recurrent non-small cell lung cancer consistent with adenocarcinoma. This was initially diagnosed as stage IB (T2a N0 M0) well-differentiated adenocarcinoma of bronchioalveolar type in December 2010.  #2 Acute pulmonary embolism in the right lower lobe lobar, segmental and subsegmental sized pulmonary arteries, diagnosed on 11/04/2011.   PRIOR THERAPY:  1.Status post right upper lobectomy on September 28, 2009 under the care of Dr. Arlyce Dice. The patient refused adjuvant chemotherapy at that time. She had disease recurrence in June of 2012.  2.Status post 4 cycles of systemic chemotherapy with carboplatin for an AUC of 6 and paclitaxel at 200 mg per meter squared and Avastin at 15 mg/kg according to the ECOG protocol #5508.  3. Chemotherapy with Avastin 15 mg/kg according to the ECOG protocol #5508 status post 32 cycles. Last cycle was given on 06/30/2013 discontinued secondary to disease progression and persistent proteinuria. 4. Tarceva 150 mg by mouth daily, started 08/09/2013, status post 10 months of treatment.  CURRENT THERAPY: Tarceva 100 mg by mouth daily started 07/02/2014  CHEMOTHERAPY INTENT: Palliative.  CURRENT # OF CHEMOTHERAPY CYCLES: 7 CURRENT ANTIEMETICS: Compazine  CURRENT SMOKING STATUS: Non-smoker  ORAL CHEMOTHERAPY AND CONSENT: Tarceva and consent was signed 07/21/2013.  CURRENT BISPHOSPHONATES USE: None  PAIN MANAGEMENT: No pain  NARCOTICS INDUCED CONSTIPATION: No constipation.  LIVING WILL AND CODE STATUS: Full code   INTERVAL HISTORY: [name] 74 y.o. female returns to the clinic today for routine monthly followup visit. The patient is feeling fine today except for mild skin rash mainly on the face and few episodes of diarrhea. She is tolerating her current dose of  Tarceva 100 mg by mouth daily fairly well. She denied having any significant nausea or vomiting. She denied having any chest pain but continues to have shortness of breath with exertion with no hemoptysis. The patient denied having any fever or chills. She reports an episode of heartburn, belching and nausea related to something she ate. She also reports an ER visit last Wednesday for a superficial clot. She was advise to apply warm compresses and wear compression socks.Marland Kitchen  MEDICAL HISTORY: Past Medical History  Diagnosis Date  . Hypertension   . Hypothyroidism   . Hypercholesterolemia   . Glaucoma   . Hip pain 09/02/2011  . Foot pain 09/02/2011  . Lung cancer 03/08/2011    recurrent    ALLERGIES:  is allergic to cymbalta; fish allergy; amitriptyline; codeine; and sulfa antibiotics.  MEDICATIONS:  Current Outpatient Prescriptions  Medication Sig Dispense Refill  . acetaminophen (TYLENOL) 650 MG CR tablet Take 650 mg by mouth once.    Marland Kitchen amLODipine (NORVASC) 5 MG tablet Take 10 mg by mouth daily. Per [name], NP    . clindamycin (CLEOCIN T) 1 % lotion Apply topically 2 (two) times daily. Apply twice daily as needed 120 mL 0  . clonazePAM (KLONOPIN) 0.5 MG tablet Take 0.5 mg by mouth 2 (two) times daily as needed. Per [name], NP    . cyanocobalamin (,VITAMIN B-12,) 1000 MCG/ML injection Inject 1,000 mcg into the muscle every 30 (thirty) days.    . diphenoxylate-atropine (LOMOTIL) 2.5-0.025 MG per tablet Take 1 to 2 tablets by mouth every 6 hours as needed for diarrhea 30 tablet 1  . dorzolamide-timolol (COSOPT) 22.3-6.8 MG/ML ophthalmic solution Place 2 drops into both eyes  2 (two) times daily. Per [name], NP    . erlotinib (TARCEVA) 100 MG tablet Take 1 tablet (100 mg total) by mouth daily. Take on an empty stomach 1 hour before meals or 2 hours after 30 tablet 0  . fluticasone (FLONASE) 50 MCG/ACT nasal spray Place 1 spray into both nostrils daily as needed for allergies  or rhinitis.    Marland Kitchen levothyroxine (SYNTHROID, LEVOTHROID) 88 MCG tablet Take 88 mcg by mouth daily before breakfast.    . lidocaine-prilocaine (EMLA) cream Apply 1 application topically as needed (prior to port access).     Marland Kitchen lisinopril (PRINIVIL,ZESTRIL) 40 MG tablet Take 40 mg by mouth daily. Per [name], NP     . loperamide (IMODIUM) 2 MG capsule Take 1 capsule (2 mg total) by mouth every 4 (four) hours as needed for diarrhea or loose stools. 30 capsule 0  . naproxen (NAPROSYN) 375 MG tablet Take 1 tablet (375 mg total) by mouth 2 (two) times daily. 20 tablet 0  . ondansetron (ZOFRAN) 4 MG tablet Take 1 tablet (4 mg total) by mouth every 8 (eight) hours as needed for nausea or vomiting. 20 tablet 0  . prochlorperazine (COMPAZINE) 10 MG tablet Take 10 mg by mouth every 6 (six) hours as needed (for nausea).     . ciprofloxacin (CIPRO) 500 MG tablet Take 500 mg by mouth 2 (two) times daily.  0   No current facility-administered medications for this visit.    SURGICAL HISTORY:  Past Surgical History  Procedure Laterality Date  . Video assisted thoracoscopy  09/28/2009  . Thoracotomy  09/28/2009    mini  . Lung lobectomy  09/28/2009    right upper    REVIEW OF SYSTEMS:  Constitutional: positive for fatigue Eyes: negative Ears, nose, mouth, throat, and face: negative Respiratory: negative Cardiovascular: negative Gastrointestinal: Intermittent diarrhea Genitourinary: Increased urine frequency Integument/breast: Acne-like skin rash on the face especially the nose area. Hematologic/lymphatic: negative Musculoskeletal:negative Neurological: positive for paresthesia Behavioral/Psych: negative Endocrine: negative Allergic/Immunologic: negative   PHYSICAL EXAMINATION: General appearance: alert, cooperative, fatigued and no distress Head: Normocephalic, without obvious abnormality, atraumatic Neck: no adenopathy, no JVD, supple, symmetrical, trachea midline and thyroid not enlarged,  symmetric, no tenderness/mass/nodules Lymph nodes: Cervical, supraclavicular, and axillary nodes normal. Resp: clear to auscultation bilaterally Back: symmetric, no curvature. ROM normal. No CVA tenderness. Cardio: regular rate and rhythm, S1, S2 normal, no murmur, click, rub or gallop GI: soft, non-tender; bowel sounds normal; no masses,  no organomegaly Extremities: mild tenderness and erythema bilateral lower extremities, left greater than right Neurologic: Alert and oriented X 3, normal strength and tone. Normal symmetric reflexes. Normal coordination and gait  ECOG PERFORMANCE STATUS: 1 - Symptomatic but completely ambulatory  Blood pressure 146/66, pulse 80, temperature 97.6 F (36.4 C), temperature source Oral, resp. rate 18, height 5\' 5"  (1.651 m), weight 139 lb 3.2 oz (63.141 kg).  LABORATORY DATA: Lab Results  Component Value Date   WBC 9.7 01/19/2015   HGB 11.5* 01/19/2015   HCT 35.0 01/19/2015   MCV 88.4 01/19/2015   PLT 235 01/19/2015      Chemistry      Component Value Date/Time   NA 141 01/19/2015 1011   NA 141 01/11/2015 1603   NA 142 03/08/2011 0754   K 4.0 01/19/2015 1011   K 3.6 01/11/2015 1603   K 4.2 03/08/2011 0754   CL 105 01/11/2015 1603   CL 107 03/30/2013 1036   CL 102 03/08/2011 0754  CO2 23 01/19/2015 1011   CO2 25 05/25/2014 0332   CO2 27 03/08/2011 0754   BUN 12.9 01/19/2015 1011   BUN 10 01/11/2015 1603   BUN 9 03/08/2011 0754   CREATININE 1.0 01/19/2015 1011   CREATININE 1.20* 01/11/2015 1603   CREATININE 1.0 03/08/2011 0754      Component Value Date/Time   CALCIUM 9.2 01/19/2015 1011   CALCIUM 9.2 05/25/2014 0332   CALCIUM 9.5 03/08/2011 0754   ALKPHOS 90 01/19/2015 1011   ALKPHOS 67 05/25/2014 0332   ALKPHOS 59 03/08/2011 0754   AST 11 01/19/2015 1011   AST 17 05/25/2014 0332   AST 19 03/08/2011 0754   ALT 10 01/19/2015 1011   ALT 9 05/25/2014 0332   ALT 18 03/08/2011 0754   BILITOT 0.63 01/19/2015 1011   BILITOT 0.4  05/25/2014 0332   BILITOT 0.50 03/08/2011 0754     RADIOLOGY RESULTS: No results found.  ASSESSMENT AND PLAN: This is a very pleasant 74 years old [place] American female with recurrent non-small cell lung cancer most recently treated with maintenance Avastin status post 32 cycles. She is currently on treatment with single agent Tarceva 150 mg by mouth daily status post 10 months and tolerating her treatment fairly well except for mild skin rash and occasional diarrhea. She is tolerating her current treatment with Tarceva 100 milligrams by mouth daily fairly well. She status post 7 months of treatment. The last CT scan of the chest showed no evidence for disease progression.She will continue Tarceva with the same dose. For diarrhea, she will continue on Imodium when necessary. For the skin rash, she will continue to apply the clindamycin 1% lotion to the skin rash area twice a day. She will come back for a followup visit in one month for reevaluation with repeat blood work and a restaging CT scan of her chest without contrast to re-evaluate her disease.. The patient was advised to call immediately if she has any concerning symptoms in the interval. The patient voices understanding of current disease status and treatment options and is in agreement with the current care plan.  All questions were answered. The patient knows to call the clinic with any problems, questions or concerns. We can certainly see the patient much sooner if necessary.  Carlton Adam, PA-C 01/19/2015   Disclaimer: This note was dictated with voice recognition software. Similar sounding words can inadvertently be transcribed and may not be corrected upon review.

## 2015-01-19 NOTE — Telephone Encounter (Signed)
gave and printed appt sched and avs fo rpt for May

## 2015-01-19 NOTE — Patient Instructions (Signed)

## 2015-01-23 NOTE — Patient Instructions (Signed)
Continue on Tarceva 100 mg by mouth daily Follow up in one month with a restaging CT scan of your chest to re-evaluate your disease

## 2015-01-25 ENCOUNTER — Telehealth: Payer: Self-pay | Admitting: Medical Oncology

## 2015-01-25 NOTE — Telephone Encounter (Signed)
tarceva dispensed to pt

## 2015-02-06 DIAGNOSIS — R351 Nocturia: Secondary | ICD-10-CM | POA: Diagnosis not present

## 2015-02-06 DIAGNOSIS — N3020 Other chronic cystitis without hematuria: Secondary | ICD-10-CM | POA: Diagnosis not present

## 2015-02-06 DIAGNOSIS — R350 Frequency of micturition: Secondary | ICD-10-CM | POA: Diagnosis not present

## 2015-02-13 ENCOUNTER — Other Ambulatory Visit: Payer: Self-pay | Admitting: *Deleted

## 2015-02-13 DIAGNOSIS — C3400 Malignant neoplasm of unspecified main bronchus: Secondary | ICD-10-CM

## 2015-02-13 MED ORDER — ERLOTINIB HCL 100 MG PO TABS: 100.0000 mg | ORAL_TABLET | Freq: Every day | ORAL | Status: DC

## 2015-02-13 NOTE — Telephone Encounter (Signed)
Patient called reporting she received a call that the Tarceva would be sent and then was instructed to call her provider for Tarceva.  Denies any rash or diarrhea at this time.  This nurse will send refill order to [facility].  CT chest appointment arrival date and time also given with this call.

## 2015-02-17 ENCOUNTER — Ambulatory Visit (HOSPITAL_COMMUNITY)
Admission: RE | Admit: 2015-02-17 | Discharge: 2015-02-17 | Disposition: A | Discharge status: Home / Self Care | Payer: Medicare Other | Source: Ambulatory Visit | Attending: Physician Assistant | Admitting: Physician Assistant

## 2015-02-17 ENCOUNTER — Encounter (HOSPITAL_COMMUNITY): Payer: Self-pay

## 2015-02-17 DIAGNOSIS — C3490 Malignant neoplasm of unspecified part of unspecified bronchus or lung: Secondary | ICD-10-CM | POA: Diagnosis not present

## 2015-02-17 DIAGNOSIS — C3411 Malignant neoplasm of upper lobe, right bronchus or lung: Secondary | ICD-10-CM

## 2015-02-17 DIAGNOSIS — Z85118 Personal history of other malignant neoplasm of bronchus and lung: Secondary | ICD-10-CM | POA: Insufficient documentation

## 2015-02-17 DIAGNOSIS — J984 Other disorders of lung: Secondary | ICD-10-CM | POA: Diagnosis not present

## 2015-02-21 ENCOUNTER — Ambulatory Visit (HOSPITAL_BASED_OUTPATIENT_CLINIC_OR_DEPARTMENT_OTHER): Payer: Medicare Other

## 2015-02-21 ENCOUNTER — Other Ambulatory Visit (HOSPITAL_BASED_OUTPATIENT_CLINIC_OR_DEPARTMENT_OTHER): Payer: Medicare Other

## 2015-02-21 ENCOUNTER — Encounter: Payer: Self-pay | Admitting: Internal Medicine

## 2015-02-21 ENCOUNTER — Telehealth: Payer: Self-pay | Admitting: Internal Medicine

## 2015-02-21 ENCOUNTER — Encounter: Payer: Self-pay | Admitting: *Deleted

## 2015-02-21 ENCOUNTER — Ambulatory Visit (HOSPITAL_BASED_OUTPATIENT_CLINIC_OR_DEPARTMENT_OTHER): Payer: Medicare Other | Admitting: Internal Medicine

## 2015-02-21 ENCOUNTER — Ambulatory Visit: Payer: Medicare Other

## 2015-02-21 VITALS — BP 146/72 | HR 60 | Temp 97.6°F | Resp 18 | Ht 65.0 in | Wt 134.9 lb

## 2015-02-21 DIAGNOSIS — C3411 Malignant neoplasm of upper lobe, right bronchus or lung: Secondary | ICD-10-CM | POA: Diagnosis not present

## 2015-02-21 DIAGNOSIS — R350 Frequency of micturition: Secondary | ICD-10-CM | POA: Diagnosis not present

## 2015-02-21 DIAGNOSIS — R197 Diarrhea, unspecified: Secondary | ICD-10-CM | POA: Diagnosis not present

## 2015-02-21 DIAGNOSIS — Z95828 Presence of other vascular implants and grafts: Secondary | ICD-10-CM

## 2015-02-21 DIAGNOSIS — C3481 Malignant neoplasm of overlapping sites of right bronchus and lung: Secondary | ICD-10-CM

## 2015-02-21 DIAGNOSIS — R21 Rash and other nonspecific skin eruption: Secondary | ICD-10-CM | POA: Diagnosis not present

## 2015-02-21 DIAGNOSIS — E538 Deficiency of other specified B group vitamins: Secondary | ICD-10-CM | POA: Diagnosis not present

## 2015-02-21 LAB — COMPREHENSIVE METABOLIC PANEL (CC13)
ALT: 14 U/L (ref 0–55)
AST: 12 U/L (ref 5–34)
Albumin: 3.8 g/dL (ref 3.5–5.0)
Alkaline Phosphatase: 81 U/L (ref 40–150)
Anion Gap: 10 mEq/L (ref 3–11)
BILIRUBIN TOTAL: 0.87 mg/dL (ref 0.20–1.20)
BUN: 9.1 mg/dL (ref 7.0–26.0)
CO2: 22 mEq/L (ref 22–29)
Calcium: 9.7 mg/dL (ref 8.4–10.4)
Chloride: 110 mEq/L — ABNORMAL HIGH (ref 98–109)
Creatinine: 1.3 mg/dL — ABNORMAL HIGH (ref 0.6–1.1)
EGFR: 47 mL/min/{1.73_m2} — ABNORMAL LOW (ref >90)
GLUCOSE: 85 mg/dL (ref 70–140)
Potassium: 3.7 mEq/L (ref 3.5–5.1)
SODIUM: 142 meq/L (ref 136–145)
Total Protein: 6.6 g/dL (ref 6.4–8.3)

## 2015-02-21 LAB — CBC WITH DIFFERENTIAL/PLATELET
BASO%: 0.7 % (ref 0.0–2.0)
Basophils Absolute: 0.1 10*3/uL (ref 0.0–0.1)
EOS ABS: 0.2 10*3/uL (ref 0.0–0.5)
EOS%: 2.1 % (ref 0.0–7.0)
HCT: 36.8 % (ref 34.8–46.6)
HGB: 12 g/dL (ref 11.6–15.9)
LYMPH%: 16.7 % (ref 14.0–49.7)
MCH: 28.7 pg (ref 25.1–34.0)
MCHC: 32.6 g/dL (ref 31.5–36.0)
MCV: 88.1 fL (ref 79.5–101.0)
MONO#: 0.5 10*3/uL (ref 0.1–0.9)
MONO%: 6 % (ref 0.0–14.0)
NEUT#: 6.2 10*3/uL (ref 1.5–6.5)
NEUT%: 74.5 % (ref 38.4–76.8)
PLATELETS: 165 10*3/uL (ref 145–400)
RBC: 4.18 10*6/uL (ref 3.70–5.45)
RDW: 14.7 % — ABNORMAL HIGH (ref 11.2–14.5)
WBC: 8.3 10*3/uL (ref 3.9–10.3)
lymph#: 1.4 10*3/uL (ref 0.9–3.3)

## 2015-02-21 MED ORDER — HEPARIN SOD (PORK) LOCK FLUSH 100 UNIT/ML IV SOLN
500.0000 [IU] | Freq: Once | INTRAVENOUS | Status: AC
  Administered 2015-02-21: 500 [IU] via INTRAVENOUS
  Filled 2015-02-21: qty 5

## 2015-02-21 MED ORDER — CYANOCOBALAMIN 1000 MCG/ML IJ SOLN
1000.0000 ug | Freq: Once | INTRAMUSCULAR | Status: AC
Start: 2015-02-21 — End: 2015-02-21
  Administered 2015-02-21: 1000 ug via INTRAMUSCULAR

## 2015-02-21 MED ORDER — SODIUM CHLORIDE 0.9 % IJ SOLN
10.0000 mL | INTRAMUSCULAR | Status: DC | PRN
  Administered 2015-02-21: 10 mL via INTRAVENOUS
  Filled 2015-02-21: qty 10

## 2015-02-21 NOTE — Patient Instructions (Addendum)
Implanted Port Home Guide An implanted port is a type of central line that is placed under the skin. Central lines are used to provide IV access when treatment or nutrition needs to be given through a person's veins. Implanted ports are used for long-term IV access. An implanted port may be placed because:   You need IV medicine that would be irritating to the small veins in your hands or arms.   You need long-term IV medicines, such as antibiotics.   You need IV nutrition for a long period.   You need frequent blood draws for lab tests.   You need dialysis.  Implanted ports are usually placed in the chest area, but they can also be placed in the upper arm, the abdomen, or the leg. An implanted port has two main parts:   Reservoir. The reservoir is round and will appear as a small, raised area under your skin. The reservoir is the part where a needle is inserted to give medicines or draw blood.   Catheter. The catheter is a thin, flexible tube that extends from the reservoir. The catheter is placed into a large vein. Medicine that is inserted into the reservoir goes into the catheter and then into the vein.  HOW WILL I CARE FOR MY INCISION SITE? Do not get the incision site wet. Bathe or shower as directed by your health care provider.  HOW IS MY PORT ACCESSED? Special steps must be taken to access the port:   Before the port is accessed, a numbing cream can be placed on the skin. This helps numb the skin over the port site.   Your health care provider uses a sterile technique to access the port.  Your health care provider must put on a mask and sterile gloves.  The skin over your port is cleaned carefully with an antiseptic and allowed to dry.  The port is gently pinched between sterile gloves, and a needle is inserted into the port.  Only "non-coring" port needles should be used to access the port. Once the port is accessed, a blood return should be checked. This helps  ensure that the port is in the vein and is not clogged.   If your port needs to remain accessed for a constant infusion, a clear (transparent) bandage will be placed over the needle site. The bandage and needle will need to be changed every week, or as directed by your health care provider.   Keep the bandage covering the needle clean and dry. Do not get it wet. Follow your health care provider's instructions on how to take a shower or bath while the port is accessed.   If your port does not need to stay accessed, no bandage is needed over the port.  WHAT IS FLUSHING? Flushing helps keep the port from getting clogged. Follow your health care provider's instructions on how and when to flush the port. Ports are usually flushed with saline solution or a medicine called heparin. The need for flushing will depend on how the port is used.   If the port is used for intermittent medicines or blood draws, the port will need to be flushed:   After medicines have been given.   After blood has been drawn.   As part of routine maintenance.   If a constant infusion is running, the port may not need to be flushed.  HOW LONG WILL MY PORT STAY IMPLANTED? The port can stay in for as long as your health care   provider thinks it is needed. When it is time for the port to come out, surgery will be done to remove it. The procedure is similar to the one performed when the port was put in.  WHEN SHOULD I SEEK IMMEDIATE MEDICAL CARE? When you have an implanted port, you should seek immediate medical care if:   You notice a bad smell coming from the incision site.   You have swelling, redness, or drainage at the incision site.   You have more swelling or pain at the port site or the surrounding area.   You have a fever that is not controlled with medicine. Document Released: 09/30/2005 Document Revised: 07/21/2013 Document Reviewed: 06/07/2013 ExitCare Patient Information 2015 [employer]. This  information is not intended to replace advice given to you by your health care provider. Make sure you discuss any questions you have with your health care provider.    Vitamin B12 Injections Every person needs vitamin B12. A deficiency develops when the body does not get enough of it. One way to overcome this is by getting B12 shots (injections). A B12 shot puts the vitamin directly into muscle tissue. This avoids any problems your body might have in absorbing it from food or a pill. In some people, the body has trouble using the vitamin correctly. This can cause a B12 deficiency. Not consuming enough of the vitamin can also cause a deficiency. Getting enough vitamin B12 can be hard for elderly people. Sometimes, they do not eat a well-balanced diet. The elderly are also more likely than younger people to have medical conditions or take medications that can lead to a deficiency. WHAT DOES VITAMIN B12 DO? Vitamin B12 does many things to help the body work right:  It helps the body make healthy red blood cells.  It helps maintain nerve cells.  It is involved in the body's process of converting food into energy (metabolism).  It is needed to make the genetic material in all cells (DNA). VITAMIN B12 FOOD SOURCES Most people get plenty of vitamin B12 through the foods they eat. It is present in:  Meat, fish, poultry, and eggs.  Milk and milk products.  It also is added when certain foods are made, including some breads, cereals and yogurts. The food is then called "fortified". CAUSES The most common causes of vitamin B12 deficiency are:  Pernicious anemia. The condition develops when the body cannot make enough healthy red blood cells. This stems from a lack of a protein made in the stomach (intrinsic factor). People without this protein cannot absorb enough vitamin B12 from food.  Malabsorption. This is when the body cannot absorb the vitamin. It can be caused by:  Pernicious  anemia.  Surgery to remove part or all of the stomach can lead to malabsorption. Removal of part or all of the small intestine can also cause malabsorption.  Vegetarian diet. People who are strict about not eating foods from animals could have trouble taking in enough vitamin B12 from diet alone.  Medications. Some medicines have been linked to B12 deficiency, such as Metformin (a drug prescribed for type 2 diabetes). Long-term use of stomach acid suppressants also can keep the vitamin from being absorbed.  Intestinal problems such as inflammatory bowel disease. If there are problems in the digestive tract, vitamin B12 may not be absorbed in good enough amounts. SYMPTOMS People who do not get enough B12 can develop problems. These can include:  Anemia. This is when the body has too few   red blood cells. Red blood cells carry oxygen to the rest of the body. Without a healthy supply of red blood cells, people can feel:  Tired (fatigued).  Weak.  Severe anemia can cause:  Shortness of breath.  Dizziness.  Rapid heart rate.  Paleness.  Other Vitamin B12 deficiency symptoms include:  Diarrhea.  Numbness or tingling in the hands or feet.  Loss of appetite.  Confusion.  Sores on the tongue or in the mouth. LET YOUR CAREGIVER KNOW ABOUT:  Any allergies. It is very important to know if you are allergic or sensitive to cobalt. Vitamin B12 contains cobalt.  Any history of kidney disease.  All medications you are taking. Include prescription and over-the-counter medicines, herbs and creams.  Whether you are pregnant or breast-feeding.  If you have Leber's disease, a hereditary eye condition, vitamin B12 could make it worse. RISKS AND COMPLICATIONS Reactions to an injection are usually temporary. They might include:  Pain at the injection site.  Redness, swelling or tenderness at the site.  Headache, dizziness or weakness.  Nausea, upset stomach or diarrhea.  Numbness  or tingling.  Fever.  Joint pain.  Itching or rash. If a reaction does not go away in a short while, talk with your healthcare provider. A change in the way the shots are given, or where they are given, might need to be made. BEFORE AN INJECTION To decide whether B12 injections are right for you, your healthcare provider will probably:  Ask about your medical history.  Ask questions about your diet.  Ask about symptoms such as:  Have you felt weak?  Do you feel unusually tired?  Do you get dizzy?  Order blood tests. These may include a test to:  Check the level of red cells in your blood.  Measure B12 levels.  Check for the presence of intrinsic factor. VITAMIN B12 INJECTIONS How often you will need a vitamin B12 injection will depend on how severe your deficiency is. This also will affect how long you will need to get them. People with pernicious anemia usually get injections for their entire life. Others might get them for a shorter period. For many people, injections are given daily or weekly for several weeks. Then, once B12 levels are normal, injections are given just once a month. If the cause of the deficiency can be fixed, the injections can be stopped. Talk with your healthcare provider about what you should expect. For an injection:  The injection site will be cleaned with an alcohol swab.  Your healthcare provider will insert a needle directly into a muscle. Most any muscle can be used. Most often, an arm muscle is used. A buttocks muscle can also be used. Many people say shots in that area are less painful.  A small adhesive bandage may be put over the injection site. It usually can be taken off in an hour or less. Injections can be given by your healthcare provider. In some cases, family members give them. Sometimes, people give them to themselves. Talk with your healthcare provider about what would be best for you. If someone other than your healthcare provider  will be giving the shots, the person will need to be trained to give them correctly. HOME CARE INSTRUCTIONS   You can remove the adhesive bandage within an hour of getting a shot.  You should be able to go about your normal activities right away.  Avoid drinking large amounts of alcohol while taking vitamin B12 shots. Alcohol can   interfere with the body's use of the vitamin. SEEK MEDICAL CARE IF:   Pain, redness, swelling or tenderness at the injection site does not get better or gets worse.  Headache, dizziness or weakness does not go away.  You develop a fever of more than 100.5 F (38.1 C). SEEK IMMEDIATE MEDICAL CARE IF:   You have chest pain.  You develop shortness of breath.  You have muscle weakness that gets worse.  You develop numbness, weakness or tingling on one side or one area of the body.  You have symptoms of an allergic reaction, such as:  Hives.  Difficulty breathing.  Swelling of the lips, face, tongue or throat.  You develop a fever of more than 102.0 F (38.9 C). MAKE SURE YOU:   Understand these instructions.  Will watch your condition.  Will get help right away if you are not doing well or get worse. Document Released: 12/27/2008 Document Revised: 12/23/2011 Document Reviewed: 12/27/2008 ExitCare Patient Information 2015 [employer]. This information is not intended to replace advice given to you by your health care provider. Make sure you discuss any questions you have with your health care provider.  

## 2015-02-21 NOTE — Progress Notes (Signed)
[facility] [phone]   Fax:(336) [phone]  OFFICE PROGRESS [name]  Terri Levels, NP [phone] [place] [address]  DIAGNOSIS AND STAGE:  #1 Recurrent non-small cell lung cancer consistent with adenocarcinoma. This was initially diagnosed as stage IB (T2a N0 M0) well-differentiated adenocarcinoma of bronchioalveolar type in December 2010.  #2 Acute pulmonary embolism in the right lower lobe lobar, segmental and subsegmental sized pulmonary arteries, diagnosed on 11/04/2011.   PRIOR THERAPY:  1.Status post right upper lobectomy on September 28, 2009 under the care of Dr. Arlyce Dice. The patient refused adjuvant chemotherapy at that time. She had disease recurrence in June of 2012.  2.Status post 4 cycles of systemic chemotherapy with carboplatin for an AUC of 6 and paclitaxel at 200 mg per meter squared and Avastin at 15 mg/kg according to the ECOG protocol #5508.  3. Chemotherapy with Avastin 15 mg/kg according to the ECOG protocol #5508 status post 32 cycles. Last cycle was given on 06/30/2013 discontinued secondary to disease progression and persistent proteinuria. 4. Tarceva 150 mg by mouth daily, started 08/09/2013, status post 10 months of treatment.  CURRENT THERAPY: Tarceva 100 mg by mouth daily started 07/02/2014  CHEMOTHERAPY INTENT: Palliative.  CURRENT # OF CHEMOTHERAPY CYCLES: 9 CURRENT ANTIEMETICS: Compazine  CURRENT SMOKING STATUS: Non-smoker  ORAL CHEMOTHERAPY AND CONSENT: Tarceva and consent was signed 07/21/2013.  CURRENT BISPHOSPHONATES USE: None  PAIN MANAGEMENT: No pain  NARCOTICS INDUCED CONSTIPATION: No constipation.  LIVING WILL AND CODE STATUS: Full code   INTERVAL HISTORY: [name] 74 y.o. female returns to the clinic today for routine monthly followup visit. The patient has no significant complaints today. The patient is feeling fine today except for mild skin rash mainly on the face and few episodes of  diarrhea. She is tolerating her current dose of Tarceva 100 mg by mouth daily fairly well. She denied having any significant nausea or vomiting. She denied having any chest pain but continues to have shortness of breath with exertion with no hemoptysis. The patient denied having any fever or chills. She was seen by Dr. Risa Grill for overactive bladder She had repeat CT scan of the chest performed recently and she is here for evaluation and discussion of her scan results.  MEDICAL HISTORY: Past Medical History  Diagnosis Date  . Hypertension   . Hypothyroidism   . Hypercholesterolemia   . Glaucoma   . Hip pain 09/02/2011  . Foot pain 09/02/2011  . Lung cancer 03/08/2011    recurrent    ALLERGIES:  is allergic to cymbalta; fish allergy; amitriptyline; codeine; and sulfa antibiotics.  MEDICATIONS:  Current Outpatient Prescriptions  Medication Sig Dispense Refill  . acetaminophen (TYLENOL) 650 MG CR tablet Take 650 mg by mouth once.    Marland Kitchen amLODipine (NORVASC) 5 MG tablet Take 10 mg by mouth daily. Per [name], NP    . ciprofloxacin (CIPRO) 500 MG tablet Take 250 mg by mouth 2 (two) times daily.   0  . clindamycin (CLEOCIN T) 1 % lotion Apply topically 2 (two) times daily. Apply twice daily as needed 120 mL 0  . clonazePAM (KLONOPIN) 0.5 MG tablet Take 0.5 mg by mouth 2 (two) times daily as needed. Per [name], NP    . cyanocobalamin (,VITAMIN B-12,) 1000 MCG/ML injection Inject 1,000 mcg into the muscle every 30 (thirty) days.    . diphenoxylate-atropine (LOMOTIL) 2.5-0.025 MG per tablet Take 1 to 2 tablets by mouth every 6 hours as  needed for diarrhea 30 tablet 1  . dorzolamide-timolol (COSOPT) 22.3-6.8 MG/ML ophthalmic solution Place 2 drops into both eyes 2 (two) times daily. Per [name], NP    . erlotinib (TARCEVA) 100 MG tablet Take 1 tablet (100 mg total) by mouth daily. Take on an empty stomach 1 hour before meals or 2 hours after 30 tablet 0  . fluticasone (FLONASE) 50  MCG/ACT nasal spray Place 1 spray into both nostrils daily as needed for allergies or rhinitis.    Marland Kitchen levothyroxine (SYNTHROID, LEVOTHROID) 88 MCG tablet Take 88 mcg by mouth daily before breakfast.    . lidocaine-prilocaine (EMLA) cream Apply 1 application topically as needed (prior to port access).     Marland Kitchen lisinopril (PRINIVIL,ZESTRIL) 40 MG tablet Take 40 mg by mouth daily. Per [name], NP     . loperamide (IMODIUM) 2 MG capsule Take 1 capsule (2 mg total) by mouth every 4 (four) hours as needed for diarrhea or loose stools. 30 capsule 0  . naproxen (NAPROSYN) 375 MG tablet Take 1 tablet (375 mg total) by mouth 2 (two) times daily. 20 tablet 0  . ondansetron (ZOFRAN) 4 MG tablet Take 1 tablet (4 mg total) by mouth every 8 (eight) hours as needed for nausea or vomiting. 20 tablet 0  . prochlorperazine (COMPAZINE) 10 MG tablet Take 10 mg by mouth every 6 (six) hours as needed (for nausea).     . Vitamin D, Ergocalciferol, (DRISDOL) 50000 UNITS CAPS capsule   0   No current facility-administered medications for this visit.   Facility-Administered Medications Ordered in Other Visits  Medication Dose Route Frequency Provider Last Rate Last Dose  . sodium chloride 0.9 % injection 10 mL  10 mL Intravenous PRN Curt Bears, MD   10 mL at 02/21/15 0840    SURGICAL HISTORY:  Past Surgical History  Procedure Laterality Date  . Video assisted thoracoscopy  09/28/2009  . Thoracotomy  09/28/2009    mini  . Lung lobectomy  09/28/2009    right upper    REVIEW OF SYSTEMS:  Constitutional: positive for fatigue Eyes: negative Ears, nose, mouth, throat, and face: negative Respiratory: negative Cardiovascular: negative Gastrointestinal: Intermittent diarrhea Genitourinary: Increased urine frequency Integument/breast: Acne-like skin rash on the face especially the nose area. Hematologic/lymphatic: negative Musculoskeletal:negative Neurological: positive for paresthesia Behavioral/Psych:  negative Endocrine: negative Allergic/Immunologic: negative   PHYSICAL EXAMINATION: General appearance: alert, cooperative, fatigued and no distress Head: Normocephalic, without obvious abnormality, atraumatic Neck: no adenopathy, no JVD, supple, symmetrical, trachea midline and thyroid not enlarged, symmetric, no tenderness/mass/nodules Lymph nodes: Cervical, supraclavicular, and axillary nodes normal. Resp: clear to auscultation bilaterally Back: symmetric, no curvature. ROM normal. No CVA tenderness. Cardio: regular rate and rhythm, S1, S2 normal, no murmur, click, rub or gallop GI: soft, non-tender; bowel sounds normal; no masses,  no organomegaly Extremities: extremities normal, atraumatic, no cyanosis or edema Neurologic: Alert and oriented X 3, normal strength and tone. Normal symmetric reflexes. Normal coordination and gait  ECOG PERFORMANCE STATUS: 1 - Symptomatic but completely ambulatory  Blood pressure 146/72, pulse 60, temperature 97.6 F (36.4 C), temperature source Oral, resp. rate 18, height '5\' 5"'$  (1.651 m), weight 134 lb 14.4 oz (61.19 kg), SpO2 98 %.  LABORATORY DATA: Lab Results  Component Value Date   WBC 8.3 02/21/2015   HGB 12.0 02/21/2015   HCT 36.8 02/21/2015   MCV 88.1 02/21/2015   PLT 165 02/21/2015      Chemistry      Component Value Date/Time  NA 141 01/19/2015 1011   NA 141 01/11/2015 1603   NA 142 03/08/2011 0754   K 4.0 01/19/2015 1011   K 3.6 01/11/2015 1603   K 4.2 03/08/2011 0754   CL 105 01/11/2015 1603   CL 107 03/30/2013 1036   CL 102 03/08/2011 0754   CO2 23 01/19/2015 1011   CO2 25 05/25/2014 0332   CO2 27 03/08/2011 0754   BUN 12.9 01/19/2015 1011   BUN 10 01/11/2015 1603   BUN 9 03/08/2011 0754   CREATININE 1.0 01/19/2015 1011   CREATININE 1.20* 01/11/2015 1603   CREATININE 1.0 03/08/2011 0754      Component Value Date/Time   CALCIUM 9.2 01/19/2015 1011   CALCIUM 9.2 05/25/2014 0332   CALCIUM 9.5 03/08/2011 0754    ALKPHOS 90 01/19/2015 1011   ALKPHOS 67 05/25/2014 0332   ALKPHOS 59 03/08/2011 0754   AST 11 01/19/2015 1011   AST 17 05/25/2014 0332   AST 19 03/08/2011 0754   ALT 10 01/19/2015 1011   ALT 9 05/25/2014 0332   ALT 18 03/08/2011 0754   BILITOT 0.63 01/19/2015 1011   BILITOT 0.4 05/25/2014 0332   BILITOT 0.50 03/08/2011 0754     RADIOLOGY RESULTS: Ct Chest Wo Contrast  02/17/2015   CLINICAL DATA:  Followup lung cancer.  Original diagnosis in 2010.  EXAM: CT CHEST WITHOUT CONTRAST  TECHNIQUE: Multidetector CT imaging of the chest was performed following the standard protocol without IV contrast.  COMPARISON:  Chest CT 12/09/2014  FINDINGS: Chest wall: A right-sided Port-A-Cath is stable. No breast masses, supraclavicular or axillary lymphadenopathy. The thyroid gland is grossly normal.  The bony thorax is intact. No destructive bone lesions or spinal canal compromise. Moderate osteoporosis.  Mediastinum: The heart is normal in size. No pericardial effusion stable dense atherosclerotic calcifications involving the aorta and branch vessels including the coronary arteries. No mediastinal or hilar mass or adenopathy. Stable calcified mediastinal and hilar lymph nodes. Stable surgical changes involving the right lung. The esophagus is grossly normal.  Lungs/ pleura: Numerous bilateral semi-solid nodular densities are noted. These have not significantly changed.  Left upper lobe density on image number 12 measures 27 x 14 mm and previously measured 27 x 13 mm.  Large area of heterogeneous lung opacity and cystic change in the superior segment of the left lower lobe appears stable. The more solid portion measures 36 x 24 mm and previously measured 36 x 23.5 mm.  Irregular density in the right lower lobe in the as ago esophageal recess measures 27 x 18 mm and previously measured 30 x 18 mm.  Several smaller ground-glass opacities in the right lower lobe are stable.  No new/ worrisome pulmonary lesions.  No  pleural effusion.  Upper abdomen: Stable calcified granulomas in the liver and spleen. Stable low-attenuation lesion in the right hepatic lobe most consistent with a benign cyst. No adrenal gland lesions.  IMPRESSION: Stable CT appearance of the chest when compared to prior examinations. Numerous irregular nodular densities in both lungs are unchanged and likely represent areas of scarring. No new lesions.  No mediastinal or hilar mass or adenopathy.  Stable surgical changes involving the right upper lobe   Electronically Signed   By: Marijo Sanes M.D.   On: 02/17/2015 11:49    ASSESSMENT AND PLAN: This is a very pleasant 73 years old [place] American female with recurrent non-small cell lung cancer most recently treated with maintenance Avastin status post 32 cycles. She is currently on  treatment with single agent Tarceva 150 mg by mouth daily status post 10 months and tolerating her treatment fairly well except for mild skin rash and occasional diarrhea. She is tolerating her current treatment with Tarceva 100 milligrams by mouth daily fairly well. She status post 8 months of treatment. The recent CT scan of the chest showed no evidence for disease progression. I discussed the scan results with the patient today. I recommended for her to continue Tarceva with the same dose. For diarrhea, she will continue on Imodium when necessary. For the skin rash, she will continue to apply the clindamycin 1% lotion to the skin rash area twice a day. For the overactive bladder and increased frequency, the patient will continue her follow-up visit with Dr. Risa Grill. She will come back for followup visit in one month for reevaluation with repeat blood work. The patient was advised to call immediately if she has any concerning symptoms in the interval. The patient voices understanding of current disease status and treatment options and is in agreement with the current care plan.  All questions were answered. The  patient knows to call the clinic with any problems, questions or concerns. We can certainly see the patient much sooner if necessary.  Disclaimer: This [name] was dictated with voice recognition software. Similar sounding words can inadvertently be transcribed and may not be corrected upon review.

## 2015-02-21 NOTE — Telephone Encounter (Signed)
Gave and printed appt sched and avs fo rpt for June and June..NO pof

## 2015-02-21 NOTE — Telephone Encounter (Signed)
Gave and printed appt sched and avs fo rpt for June and JULY

## 2015-02-21 NOTE — Progress Notes (Signed)
Vitamin B12 injection given by the flush nurse

## 2015-02-27 ENCOUNTER — Other Ambulatory Visit: Payer: Self-pay | Admitting: Internal Medicine

## 2015-02-27 DIAGNOSIS — C3490 Malignant neoplasm of unspecified part of unspecified bronchus or lung: Secondary | ICD-10-CM

## 2015-03-14 DIAGNOSIS — R197 Diarrhea, unspecified: Secondary | ICD-10-CM | POA: Diagnosis not present

## 2015-03-14 DIAGNOSIS — M6281 Muscle weakness (generalized): Secondary | ICD-10-CM | POA: Diagnosis not present

## 2015-03-14 DIAGNOSIS — R350 Frequency of micturition: Secondary | ICD-10-CM | POA: Diagnosis not present

## 2015-03-14 DIAGNOSIS — R112 Nausea with vomiting, unspecified: Secondary | ICD-10-CM | POA: Diagnosis not present

## 2015-03-16 ENCOUNTER — Other Ambulatory Visit: Payer: Self-pay | Admitting: Family Medicine

## 2015-03-16 DIAGNOSIS — R29898 Other symptoms and signs involving the musculoskeletal system: Secondary | ICD-10-CM

## 2015-03-20 ENCOUNTER — Ambulatory Visit
Admission: RE | Admit: 2015-03-20 | Discharge: 2015-03-20 | Disposition: A | Discharge status: Home / Self Care | Payer: Medicare Other | Source: Ambulatory Visit | Attending: Family Medicine | Admitting: Family Medicine

## 2015-03-20 ENCOUNTER — Other Ambulatory Visit: Payer: Self-pay | Admitting: Internal Medicine

## 2015-03-20 DIAGNOSIS — R29898 Other symptoms and signs involving the musculoskeletal system: Secondary | ICD-10-CM

## 2015-03-20 DIAGNOSIS — Z85118 Personal history of other malignant neoplasm of bronchus and lung: Secondary | ICD-10-CM | POA: Diagnosis not present

## 2015-03-21 ENCOUNTER — Telehealth: Payer: Self-pay | Admitting: Physician Assistant

## 2015-03-21 ENCOUNTER — Ambulatory Visit (HOSPITAL_BASED_OUTPATIENT_CLINIC_OR_DEPARTMENT_OTHER): Payer: Medicare Other

## 2015-03-21 ENCOUNTER — Encounter: Payer: Self-pay | Admitting: Physician Assistant

## 2015-03-21 ENCOUNTER — Other Ambulatory Visit (HOSPITAL_BASED_OUTPATIENT_CLINIC_OR_DEPARTMENT_OTHER): Payer: Medicare Other

## 2015-03-21 ENCOUNTER — Ambulatory Visit (HOSPITAL_BASED_OUTPATIENT_CLINIC_OR_DEPARTMENT_OTHER): Payer: Medicare Other | Admitting: Physician Assistant

## 2015-03-21 VITALS — BP 128/56 | HR 69 | Temp 97.6°F | Resp 17 | Ht 65.0 in | Wt 132.2 lb

## 2015-03-21 DIAGNOSIS — C3411 Malignant neoplasm of upper lobe, right bronchus or lung: Secondary | ICD-10-CM

## 2015-03-21 DIAGNOSIS — R200 Anesthesia of skin: Secondary | ICD-10-CM

## 2015-03-21 DIAGNOSIS — E538 Deficiency of other specified B group vitamins: Secondary | ICD-10-CM

## 2015-03-21 DIAGNOSIS — Z95828 Presence of other vascular implants and grafts: Secondary | ICD-10-CM

## 2015-03-21 DIAGNOSIS — C3490 Malignant neoplasm of unspecified part of unspecified bronchus or lung: Secondary | ICD-10-CM | POA: Diagnosis not present

## 2015-03-21 DIAGNOSIS — C3481 Malignant neoplasm of overlapping sites of right bronchus and lung: Secondary | ICD-10-CM

## 2015-03-21 LAB — COMPREHENSIVE METABOLIC PANEL (CC13)
ALK PHOS: 78 U/L (ref 40–150)
ALT: 10 U/L (ref 0–55)
ANION GAP: 7 meq/L (ref 3–11)
AST: 15 U/L (ref 5–34)
Albumin: 3.6 g/dL (ref 3.5–5.0)
BUN: 8 mg/dL (ref 7.0–26.0)
CHLORIDE: 106 meq/L (ref 98–109)
CO2: 25 mEq/L (ref 22–29)
Calcium: 9.7 mg/dL (ref 8.4–10.4)
Creatinine: 1.2 mg/dL — ABNORMAL HIGH (ref 0.6–1.1)
EGFR: 49 mL/min/{1.73_m2} — ABNORMAL LOW (ref >90)
Glucose: 89 mg/dl (ref 70–140)
POTASSIUM: 4.1 meq/L (ref 3.5–5.1)
Sodium: 139 mEq/L (ref 136–145)
Total Bilirubin: 0.82 mg/dL (ref 0.20–1.20)
Total Protein: 6.5 g/dL (ref 6.4–8.3)

## 2015-03-21 LAB — CBC WITH DIFFERENTIAL/PLATELET
BASO%: 0.4 % (ref 0.0–2.0)
BASOS ABS: 0 10*3/uL (ref 0.0–0.1)
EOS%: 2.9 % (ref 0.0–7.0)
Eosinophils Absolute: 0.2 10*3/uL (ref 0.0–0.5)
HEMATOCRIT: 35.7 % (ref 34.8–46.6)
HEMOGLOBIN: 11.8 g/dL (ref 11.6–15.9)
LYMPH#: 1.5 10*3/uL (ref 0.9–3.3)
LYMPH%: 22.6 % (ref 14.0–49.7)
MCH: 29.3 pg (ref 25.1–34.0)
MCHC: 33.1 g/dL (ref 31.5–36.0)
MCV: 88.6 fL (ref 79.5–101.0)
MONO#: 0.6 10*3/uL (ref 0.1–0.9)
MONO%: 8.4 % (ref 0.0–14.0)
NEUT%: 65.7 % (ref 38.4–76.8)
NEUTROS ABS: 4.5 10*3/uL (ref 1.5–6.5)
Platelets: 147 10*3/uL (ref 145–400)
RBC: 4.03 10*6/uL (ref 3.70–5.45)
RDW: 15 % — AB (ref 11.2–14.5)
WBC: 6.8 10*3/uL (ref 3.9–10.3)

## 2015-03-21 MED ORDER — CYANOCOBALAMIN 1000 MCG/ML IJ SOLN
1000.0000 ug | Freq: Once | INTRAMUSCULAR | Status: AC
  Administered 2015-03-21: 1000 ug via INTRAMUSCULAR

## 2015-03-21 MED ORDER — SODIUM CHLORIDE 0.9 % IJ SOLN
10.0000 mL | INTRAMUSCULAR | Status: DC | PRN
  Administered 2015-03-21: 10 mL via INTRAVENOUS
  Filled 2015-03-21: qty 10

## 2015-03-21 MED ORDER — HEPARIN SOD (PORK) LOCK FLUSH 100 UNIT/ML IV SOLN
500.0000 [IU] | Freq: Once | INTRAVENOUS | Status: AC
  Administered 2015-03-21: 500 [IU] via INTRAVENOUS
  Filled 2015-03-21: qty 5

## 2015-03-21 NOTE — Telephone Encounter (Signed)
Pt confirmed labs/ov per 06/07 POF, gave pt AVS and Calendar.... KJ °

## 2015-03-21 NOTE — Progress Notes (Signed)
[place] Telephone:([phone]   Fax:(336) [phone]  OFFICE PROGRESS NOTE  Terri Levels, NP [phone] [address] [place] [address]  DIAGNOSIS AND STAGE:  #1 Recurrent non-small cell lung cancer consistent with adenocarcinoma. This was initially diagnosed as stage IB (T2a N0 M0) well-differentiated adenocarcinoma of bronchioalveolar type in December 2010.  #2 Acute pulmonary embolism in the right lower lobe lobar, segmental and subsegmental sized pulmonary arteries, diagnosed on 11/04/2011.   PRIOR THERAPY:  1.Status post right upper lobectomy on September 28, 2009 under the care of Dr. Arlyce Dice. The patient refused adjuvant chemotherapy at that time. She had disease recurrence in June of 2012.  2.Status post 4 cycles of systemic chemotherapy with carboplatin for an AUC of 6 and paclitaxel at 200 mg per meter squared and Avastin at 15 mg/kg according to the ECOG protocol #5508.  3. Chemotherapy with Avastin 15 mg/kg according to the ECOG protocol #5508 status post 32 cycles. Last cycle was given on 06/30/2013 discontinued secondary to disease progression and persistent proteinuria. 4. Tarceva 150 mg by mouth daily, started 08/09/2013, status post 10 months of treatment.  CURRENT THERAPY: Tarceva 100 mg by mouth daily started 07/02/2014  CHEMOTHERAPY INTENT: Palliative.  CURRENT # OF CHEMOTHERAPY CYCLES: 10 CURRENT ANTIEMETICS: Compazine  CURRENT SMOKING STATUS: Non-smoker  ORAL CHEMOTHERAPY AND CONSENT: Tarceva and consent was signed 07/21/2013.  CURRENT BISPHOSPHONATES USE: None  PAIN MANAGEMENT: No pain  NARCOTICS INDUCED CONSTIPATION: No constipation.  LIVING WILL AND CODE STATUS: Full code   INTERVAL HISTORY: [name] 74 y.o. female returns to the clinic today for routine monthly followup visit. The patient has no significant complaints today. She does however report an episode of weakness and inability to control her left arm last Sunday. On  Saturday she reports that she can chicken salad that she probably shouldn't have been developed nausea, vomiting and diarrhea. She was feeling weak the next day which was Sunday and states that she had difficulty getting out of the bed and actually crumpled to the floor. She did not sustain any damage. She "worked with herself" for the rest of the day and was able to ambulate without difficulty. Her primary care physician ordered an MRI of her brain which was performed 03/20/2015 that was negative for acute infarct or mass. She does not report any residual symptoms.  She is tolerating her current dose of Tarceva 100 mg by mouth daily fairly well with the exception of mild skin rash and a few episodes of diarrhea.. She denied having any significant nausea or vomiting. She denied having any chest pain but continues to have shortness of breath with exertion with no hemoptysis. The patient denied having any fever or chills.   MEDICAL HISTORY: Past Medical History  Diagnosis Date  . Hypertension   . Hypothyroidism   . Hypercholesterolemia   . Glaucoma   . Hip pain 09/02/2011  . Foot pain 09/02/2011  . Lung cancer 03/08/2011    recurrent    ALLERGIES:  is allergic to cymbalta; fish allergy; amitriptyline; codeine; and sulfa antibiotics.  MEDICATIONS:  Current Outpatient Prescriptions  Medication Sig Dispense Refill  . acetaminophen (TYLENOL) 650 MG CR tablet Take 650 mg by mouth once.    Marland Kitchen amLODipine (NORVASC) 5 MG tablet Take 10 mg by mouth daily. Per [name], NP    . ciprofloxacin (CIPRO) 500 MG tablet Take 250 mg by mouth 2 (two) times daily.   0  . clindamycin (CLEOCIN T)  1 % lotion Apply topically 2 (two) times daily. Apply twice daily as needed 120 mL 0  . clonazePAM (KLONOPIN) 0.5 MG tablet Take 0.5 mg by mouth 2 (two) times daily as needed. Per [name], NP    . cyanocobalamin (,VITAMIN B-12,) 1000 MCG/ML injection Inject 1,000 mcg into the muscle every 30 (thirty) days.    .  diphenoxylate-atropine (LOMOTIL) 2.5-0.025 MG per tablet Take 1 to 2 tablets by mouth every 6 hours as needed for diarrhea 30 tablet 1  . dorzolamide-timolol (COSOPT) 22.3-6.8 MG/ML ophthalmic solution Place 2 drops into both eyes 2 (two) times daily. Per [name], NP    . erlotinib (TARCEVA) 100 MG tablet Take 1 tablet (100 mg total) by mouth daily. Take on an empty stomach 1 hour before meals or 2 hours after 30 tablet 0  . fluticasone (FLONASE) 50 MCG/ACT nasal spray Place 1 spray into both nostrils daily as needed for allergies or rhinitis.    Marland Kitchen levothyroxine (SYNTHROID, LEVOTHROID) 88 MCG tablet Take 88 mcg by mouth daily before breakfast.    . lidocaine-prilocaine (EMLA) cream Apply 1 application topically as needed (prior to port access).     Marland Kitchen lisinopril (PRINIVIL,ZESTRIL) 40 MG tablet Take 40 mg by mouth daily. Per [name], NP     . loperamide (IMODIUM) 2 MG capsule Take 1 capsule (2 mg total) by mouth every 4 (four) hours as needed for diarrhea or loose stools. 30 capsule 0  . naproxen (NAPROSYN) 375 MG tablet Take 1 tablet (375 mg total) by mouth 2 (two) times daily. 20 tablet 0  . ondansetron (ZOFRAN) 4 MG tablet Take 1 tablet (4 mg total) by mouth every 8 (eight) hours as needed for nausea or vomiting. 20 tablet 0  . prochlorperazine (COMPAZINE) 10 MG tablet Take 10 mg by mouth every 6 (six) hours as needed (for nausea).     . TARCEVA 100 MG tablet TAKE 1 TABLET BY MOUTH DAILY ON AN EMPTY STOMACH 30 tablet 2  . Vitamin D, Ergocalciferol, (DRISDOL) 50000 UNITS CAPS capsule   0   No current facility-administered medications for this visit.    SURGICAL HISTORY:  Past Surgical History  Procedure Laterality Date  . Video assisted thoracoscopy  09/28/2009  . Thoracotomy  09/28/2009    mini  . Lung lobectomy  09/28/2009    right upper    REVIEW OF SYSTEMS:  Constitutional: positive for fatigue Eyes: negative Ears, nose, mouth, throat, and face: negative Respiratory:  negative Cardiovascular: negative Gastrointestinal: Intermittent diarrhea Genitourinary: Increased urine frequency Integument/breast: Acne-like skin rash on the face especially the nose area. Hematologic/lymphatic: negative Musculoskeletal:negative Neurological: positive for paresthesia, recent episode of weakness/questionable TIA Behavioral/Psych: negative Endocrine: negative Allergic/Immunologic: negative   PHYSICAL EXAMINATION: General appearance: alert, cooperative, fatigued and no distress Head: Normocephalic, without obvious abnormality, atraumatic Neck: no adenopathy, no JVD, supple, symmetrical, trachea midline and thyroid not enlarged, symmetric, no tenderness/mass/nodules Lymph nodes: Cervical, supraclavicular, and axillary nodes normal. Resp: clear to auscultation bilaterally Back: symmetric, no curvature. ROM normal. No CVA tenderness. Cardio: regular rate and rhythm, S1, S2 normal, no murmur, click, rub or gallop GI: soft, non-tender; bowel sounds normal; no masses,  no organomegaly Extremities: extremities normal, atraumatic, no cyanosis or edema Neurologic: Alert and oriented X 3, normal strength and tone. Normal symmetric reflexes. Normal coordination and gait  ECOG PERFORMANCE STATUS: 1 - Symptomatic but completely ambulatory  Blood pressure 128/56, pulse 69, temperature 97.6 F (36.4 C), temperature source Oral, resp. rate 17, height  $'5\' 5"'g$  (1.651 m), weight 132 lb 3.2 oz (59.966 kg), SpO2 100 %.  LABORATORY DATA: Lab Results  Component Value Date   WBC 6.8 03/21/2015   HGB 11.8 03/21/2015   HCT 35.7 03/21/2015   MCV 88.6 03/21/2015   PLT 147 03/21/2015      Chemistry      Component Value Date/Time   NA 139 03/21/2015 0907   NA 141 01/11/2015 1603   NA 142 03/08/2011 0754   K 4.1 03/21/2015 0907   K 3.6 01/11/2015 1603   K 4.2 03/08/2011 0754   CL 105 01/11/2015 1603   CL 107 03/30/2013 1036   CL 102 03/08/2011 0754   CO2 25 03/21/2015 0907   CO2 25  05/25/2014 0332   CO2 27 03/08/2011 0754   BUN 8.0 03/21/2015 0907   BUN 10 01/11/2015 1603   BUN 9 03/08/2011 0754   CREATININE 1.2* 03/21/2015 0907   CREATININE 1.20* 01/11/2015 1603   CREATININE 1.0 03/08/2011 0754      Component Value Date/Time   CALCIUM 9.7 03/21/2015 0907   CALCIUM 9.2 05/25/2014 0332   CALCIUM 9.5 03/08/2011 0754   ALKPHOS 78 03/21/2015 0907   ALKPHOS 67 05/25/2014 0332   ALKPHOS 59 03/08/2011 0754   AST 15 03/21/2015 0907   AST 17 05/25/2014 0332   AST 19 03/08/2011 0754   ALT 10 03/21/2015 0907   ALT 9 05/25/2014 0332   ALT 18 03/08/2011 0754   BILITOT 0.82 03/21/2015 0907   BILITOT 0.4 05/25/2014 0332   BILITOT 0.50 03/08/2011 0754     RADIOLOGY RESULTS: Mr Brain Wo Contrast  03/20/2015   CLINICAL DATA:  Left-sided weakness.  History of lung cancer.  EXAM: MRI HEAD WITHOUT CONTRAST  TECHNIQUE: Multiplanar, multiecho pulse sequences of the brain and surrounding structures were obtained without intravenous contrast.  COMPARISON:  CT head 01/23/2012  FINDINGS: Mild cerebral atrophy, typical for age.  Negative for hydrocephalus.  Pituitary normal in size. No skull lesion identified. Minimal mucosal edema in the paranasal sinuses. Normal orbit bilaterally.  Negative for acute infarct.  Scattered hyperintensities throughout the cerebral white matter bilaterally consistent with chronic microvascular ischemia. 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 Chronic ischemia in the pons bilaterally.  Negative for intracranial hemorrhage or mass lesion. No evidence of metastatic disease on unenhanced imaging. Note that small metastatic deposits can be missed on unenhanced images.  IMPRESSION: Atrophy and chronic ischemia of a moderate degree. No acute infarct or mass.   Electronically Signed   By: Franchot Gallo M.D.   On: 03/20/2015 15:37    ASSESSMENT AND PLAN: This is a very pleasant 74 years old [place] American female with recurrent non-small cell lung  cancer most recently treated with maintenance Avastin status post 32 cycles. She was on treatment with single agent Tarceva 150 mg by mouth daily status post 10 months and tolerating her treatment fairly well except for mild skin rash and occasional diarrhea. She is tolerating her current treatment with Tarceva 100 milligrams by mouth daily fairly well. She status post 8 months of treatment. The recent CT scan of the chest showed no evidence for disease progression. Patient was discussed with and also seen by Dr. Julien Nordmann. She will continue Tarceva with the same dose. For diarrhea, she will continue on Imodium when necessary. For the skin rash, she will continue to apply the clindamycin 1% lotion to the skin rash area twice a day. For the overactive bladder and increased  frequency, the patient will continue her follow-up visits with Dr. Risa Grill. Regarding her episode of weakness she was advised should she have another episode to call 911 for transport to the nearest hospital for acute evaluation. She was also advised to start taken at 81 mg aspirin daily. She will follow-up in one month for reevaluation with repeat blood work. The patient was advised to call immediately if she has any concerning symptoms in the interval. The patient voices understanding of current disease status and treatment options and is in agreement with the current care plan.  All questions were answered. The patient knows to call the clinic with any problems, questions or concerns. We can certainly see the patient much sooner if necessary.  Carlton Adam, PA-C 03/21/2015   Disclaimer: This note was dictated with voice recognition software. Similar sounding words can inadvertently be transcribed and may not be corrected upon review.

## 2015-03-21 NOTE — Patient Instructions (Signed)

## 2015-03-27 ENCOUNTER — Telehealth: Payer: Self-pay | Admitting: Internal Medicine

## 2015-03-27 NOTE — Telephone Encounter (Signed)
AM PAL - moved 7/5 appointments for lab/flush/MM/inj to PM. Spoke with patient she is aware.

## 2015-04-18 ENCOUNTER — Telehealth: Payer: Self-pay | Admitting: Internal Medicine

## 2015-04-18 ENCOUNTER — Ambulatory Visit (HOSPITAL_BASED_OUTPATIENT_CLINIC_OR_DEPARTMENT_OTHER): Payer: Medicare Other | Admitting: Internal Medicine

## 2015-04-18 ENCOUNTER — Encounter: Payer: Self-pay | Admitting: Internal Medicine

## 2015-04-18 ENCOUNTER — Other Ambulatory Visit (HOSPITAL_BASED_OUTPATIENT_CLINIC_OR_DEPARTMENT_OTHER): Payer: Medicare Other

## 2015-04-18 ENCOUNTER — Ambulatory Visit: Payer: Medicare Other

## 2015-04-18 ENCOUNTER — Ambulatory Visit (HOSPITAL_BASED_OUTPATIENT_CLINIC_OR_DEPARTMENT_OTHER): Payer: Medicare Other

## 2015-04-18 VITALS — BP 137/64 | HR 91 | Temp 98.4°F | Resp 20 | Ht 65.0 in | Wt 130.7 lb

## 2015-04-18 DIAGNOSIS — R197 Diarrhea, unspecified: Secondary | ICD-10-CM | POA: Diagnosis not present

## 2015-04-18 DIAGNOSIS — R21 Rash and other nonspecific skin eruption: Secondary | ICD-10-CM

## 2015-04-18 DIAGNOSIS — C3481 Malignant neoplasm of overlapping sites of right bronchus and lung: Secondary | ICD-10-CM

## 2015-04-18 DIAGNOSIS — E538 Deficiency of other specified B group vitamins: Secondary | ICD-10-CM | POA: Diagnosis not present

## 2015-04-18 DIAGNOSIS — Z95828 Presence of other vascular implants and grafts: Secondary | ICD-10-CM

## 2015-04-18 DIAGNOSIS — C3411 Malignant neoplasm of upper lobe, right bronchus or lung: Secondary | ICD-10-CM

## 2015-04-18 DIAGNOSIS — C3490 Malignant neoplasm of unspecified part of unspecified bronchus or lung: Secondary | ICD-10-CM

## 2015-04-18 DIAGNOSIS — R200 Anesthesia of skin: Secondary | ICD-10-CM

## 2015-04-18 LAB — CBC WITH DIFFERENTIAL/PLATELET
BASO%: 0.7 % (ref 0.0–2.0)
BASOS ABS: 0.1 10*3/uL (ref 0.0–0.1)
EOS%: 2.5 % (ref 0.0–7.0)
Eosinophils Absolute: 0.2 10*3/uL (ref 0.0–0.5)
HCT: 36 % (ref 34.8–46.6)
HEMOGLOBIN: 11.8 g/dL (ref 11.6–15.9)
LYMPH#: 2 10*3/uL (ref 0.9–3.3)
LYMPH%: 24.2 % (ref 14.0–49.7)
MCH: 29.1 pg (ref 25.1–34.0)
MCHC: 32.8 g/dL (ref 31.5–36.0)
MCV: 88.8 fL (ref 79.5–101.0)
MONO#: 0.6 10*3/uL (ref 0.1–0.9)
MONO%: 6.8 % (ref 0.0–14.0)
NEUT#: 5.5 10*3/uL (ref 1.5–6.5)
NEUT%: 65.8 % (ref 38.4–76.8)
Platelets: 184 10*3/uL (ref 145–400)
RBC: 4.06 10*6/uL (ref 3.70–5.45)
RDW: 15 % — AB (ref 11.2–14.5)
WBC: 8.4 10*3/uL (ref 3.9–10.3)

## 2015-04-18 LAB — COMPREHENSIVE METABOLIC PANEL (CC13)
ALK PHOS: 71 U/L (ref 40–150)
ALT: 12 U/L (ref 0–55)
AST: 12 U/L (ref 5–34)
Albumin: 3.7 g/dL (ref 3.5–5.0)
Anion Gap: 8 mEq/L (ref 3–11)
BUN: 12.1 mg/dL (ref 7.0–26.0)
CO2: 24 mEq/L (ref 22–29)
Calcium: 9.9 mg/dL (ref 8.4–10.4)
Chloride: 109 mEq/L (ref 98–109)
Creatinine: 1.4 mg/dL — ABNORMAL HIGH (ref 0.6–1.1)
EGFR: 44 mL/min/{1.73_m2} — AB (ref >90)
GLUCOSE: 94 mg/dL (ref 70–140)
Potassium: 3.7 mEq/L (ref 3.5–5.1)
Sodium: 141 mEq/L (ref 136–145)
Total Bilirubin: 0.53 mg/dL (ref 0.20–1.20)
Total Protein: 6.5 g/dL (ref 6.4–8.3)

## 2015-04-18 MED ORDER — HEPARIN SOD (PORK) LOCK FLUSH 100 UNIT/ML IV SOLN
500.0000 [IU] | Freq: Once | INTRAVENOUS | Status: AC
  Administered 2015-04-18: 500 [IU] via INTRAVENOUS
  Filled 2015-04-18: qty 5

## 2015-04-18 MED ORDER — CYANOCOBALAMIN 1000 MCG/ML IJ SOLN
1000.0000 ug | Freq: Once | INTRAMUSCULAR | Status: AC
  Administered 2015-04-18: 1000 ug via INTRAMUSCULAR

## 2015-04-18 MED ORDER — SODIUM CHLORIDE 0.9 % IJ SOLN
10.0000 mL | INTRAMUSCULAR | Status: DC | PRN
  Administered 2015-04-18: 10 mL via INTRAVENOUS
  Filled 2015-04-18: qty 10

## 2015-04-18 NOTE — Patient Instructions (Signed)
Vitamin B12 Injections Every person needs vitamin B12. A deficiency develops when the body does not get enough of it. One way to overcome this is by getting B12 shots (injections). A B12 shot puts the vitamin directly into muscle tissue. This avoids any problems your body might have in absorbing it from food or a pill. In some people, the body has trouble using the vitamin correctly. This can cause a B12 deficiency. Not consuming enough of the vitamin can also cause a deficiency. Getting enough vitamin B12 can be hard for elderly people. Sometimes, they do not eat a well-balanced diet. The elderly are also more likely than younger people to have medical conditions or take medications that can lead to a deficiency. WHAT DOES VITAMIN B12 DO? Vitamin B12 does many things to help the body work right:  It helps the body make healthy red blood cells.  It helps maintain nerve cells.  It is involved in the body's process of converting food into energy (metabolism).  It is needed to make the genetic material in all cells (DNA). VITAMIN B12 FOOD SOURCES Most people get plenty of vitamin B12 through the foods they eat. It is present in:  Meat, fish, poultry, and eggs.  Milk and milk products.  It also is added when certain foods are made, including some breads, cereals and yogurts. The food is then called "fortified". CAUSES The most common causes of vitamin B12 deficiency are:  Pernicious anemia. The condition develops when the body cannot make enough healthy red blood cells. This stems from a lack of a protein made in the stomach (intrinsic factor). People without this protein cannot absorb enough vitamin B12 from food.  Malabsorption. This is when the body cannot absorb the vitamin. It can be caused by:  Pernicious anemia.  Surgery to remove part or all of the stomach can lead to malabsorption. Removal of part or all of the small intestine can also cause malabsorption.  Vegetarian diet.  People who are strict about not eating foods from animals could have trouble taking in enough vitamin B12 from diet alone.  Medications. Some medicines have been linked to B12 deficiency, such as Metformin (a drug prescribed for type 2 diabetes). Long-term use of stomach acid suppressants also can keep the vitamin from being absorbed.  Intestinal problems such as inflammatory bowel disease. If there are problems in the digestive tract, vitamin B12 may not be absorbed in good enough amounts. SYMPTOMS People who do not get enough B12 can develop problems. These can include:  Anemia. This is when the body has too few red blood cells. Red blood cells carry oxygen to the rest of the body. Without a healthy supply of red blood cells, people can feel:  Tired (fatigued).  Weak.  Severe anemia can cause:  Shortness of breath.  Dizziness.  Rapid heart rate.  Paleness.  Other Vitamin B12 deficiency symptoms include:  Diarrhea.  Numbness or tingling in the hands or feet.  Loss of appetite.  Confusion.  Sores on the tongue or in the mouth. LET YOUR CAREGIVER KNOW ABOUT:  Any allergies. It is very important to know if you are allergic or sensitive to cobalt. Vitamin B12 contains cobalt.  Any history of kidney disease.  All medications you are taking. Include prescription and over-the-counter medicines, herbs and creams.  Whether you are pregnant or breast-feeding.  If you have Leber's disease, a hereditary eye condition, vitamin B12 could make it worse. RISKS AND COMPLICATIONS Reactions to an injection are   usually temporary. They might include:  Pain at the injection site.  Redness, swelling or tenderness at the site.  Headache, dizziness or weakness.  Nausea, upset stomach or diarrhea.  Numbness or tingling.  Fever.  Joint pain.  Itching or rash. If a reaction does not go away in a short while, talk with your healthcare provider. A change in the way the shots are  given, or where they are given, might need to be made. BEFORE AN INJECTION To decide whether B12 injections are right for you, your healthcare provider will probably:  Ask about your medical history.  Ask questions about your diet.  Ask about symptoms such as:  Have you felt weak?  Do you feel unusually tired?  Do you get dizzy?  Order blood tests. These may include a test to:  Check the level of red cells in your blood.  Measure B12 levels.  Check for the presence of intrinsic factor. VITAMIN B12 INJECTIONS How often you will need a vitamin B12 injection will depend on how severe your deficiency is. This also will affect how long you will need to get them. People with pernicious anemia usually get injections for their entire life. Others might get them for a shorter period. For many people, injections are given daily or weekly for several weeks. Then, once B12 levels are normal, injections are given just once a month. If the cause of the deficiency can be fixed, the injections can be stopped. Talk with your healthcare provider about what you should expect. For an injection:  The injection site will be cleaned with an alcohol swab.  Your healthcare provider will insert a needle directly into a muscle. Most any muscle can be used. Most often, an arm muscle is used. A buttocks muscle can also be used. Many people say shots in that area are less painful.  A small adhesive bandage may be put over the injection site. It usually can be taken off in an hour or less. Injections can be given by your healthcare provider. In some cases, family members give them. Sometimes, people give them to themselves. Talk with your healthcare provider about what would be best for you. If someone other than your healthcare provider will be giving the shots, the person will need to be trained to give them correctly. HOME CARE INSTRUCTIONS   You can remove the adhesive bandage within an hour of getting a  shot.  You should be able to go about your normal activities right away.  Avoid drinking large amounts of alcohol while taking vitamin B12 shots. Alcohol can interfere with the body's use of the vitamin. SEEK MEDICAL CARE IF:   Pain, redness, swelling or tenderness at the injection site does not get better or gets worse.  Headache, dizziness or weakness does not go away.  You develop a fever of more than 100.5 F (38.1 C). SEEK IMMEDIATE MEDICAL CARE IF:   You have chest pain.  You develop shortness of breath.  You have muscle weakness that gets worse.  You develop numbness, weakness or tingling on one side or one area of the body.  You have symptoms of an allergic reaction, such as:  Hives.  Difficulty breathing.  Swelling of the lips, face, tongue or throat.  You develop a fever of more than 102.0 F (38.9 C). MAKE SURE YOU:   Understand these instructions.  Will watch your condition.  Will get help right away if you are not doing well or get worse. Document   Released: 12/27/2008 Document Revised: 12/23/2011 Document Reviewed: 12/27/2008 ExitCare Patient Information 2015 [employer]. This information is not intended to replace advice given to you by your health care provider. Make sure you discuss any questions you have with your health care provider.   Implanted Port Home Guide An implanted port is a type of central line that is placed under the skin. Central lines are used to provide IV access when treatment or nutrition needs to be given through a person's veins. Implanted ports are used for long-term IV access. An implanted port may be placed because:   You need IV medicine that would be irritating to the small veins in your hands or arms.   You need long-term IV medicines, such as antibiotics.   You need IV nutrition for a long period.   You need frequent blood draws for lab tests.   You need dialysis.  Implanted ports are usually placed in the  chest area, but they can also be placed in the upper arm, the abdomen, or the leg. An implanted port has two main parts:   Reservoir. The reservoir is round and will appear as a small, raised area under your skin. The reservoir is the part where a needle is inserted to give medicines or draw blood.   Catheter. The catheter is a thin, flexible tube that extends from the reservoir. The catheter is placed into a large vein. Medicine that is inserted into the reservoir goes into the catheter and then into the vein.  HOW WILL I CARE FOR MY INCISION SITE? Do not get the incision site wet. Bathe or shower as directed by your health care provider.  HOW IS MY PORT ACCESSED? Special steps must be taken to access the port:   Before the port is accessed, a numbing cream can be placed on the skin. This helps numb the skin over the port site.   Your health care provider uses a sterile technique to access the port.  Your health care provider must put on a mask and sterile gloves.  The skin over your port is cleaned carefully with an antiseptic and allowed to dry.  The port is gently pinched between sterile gloves, and a needle is inserted into the port.  Only "non-coring" port needles should be used to access the port. Once the port is accessed, a blood return should be checked. This helps ensure that the port is in the vein and is not clogged.   If your port needs to remain accessed for a constant infusion, a clear (transparent) bandage will be placed over the needle site. The bandage and needle will need to be changed every week, or as directed by your health care provider.   Keep the bandage covering the needle clean and dry. Do not get it wet. Follow your health care provider's instructions on how to take a shower or bath while the port is accessed.   If your port does not need to stay accessed, no bandage is needed over the port.  WHAT IS FLUSHING? Flushing helps keep the port from getting  clogged. Follow your health care provider's instructions on how and when to flush the port. Ports are usually flushed with saline solution or a medicine called heparin. The need for flushing will depend on how the port is used.   If the port is used for intermittent medicines or blood draws, the port will need to be flushed:   After medicines have been given.   After blood   has been drawn.   As part of routine maintenance.   If a constant infusion is running, the port may not need to be flushed.  HOW LONG WILL MY PORT STAY IMPLANTED? The port can stay in for as long as your health care provider thinks it is needed. When it is time for the port to come out, surgery will be done to remove it. The procedure is similar to the one performed when the port was put in.  WHEN SHOULD I SEEK IMMEDIATE MEDICAL CARE? When you have an implanted port, you should seek immediate medical care if:   You notice a bad smell coming from the incision site.   You have swelling, redness, or drainage at the incision site.   You have more swelling or pain at the port site or the surrounding area.   You have a fever that is not controlled with medicine. Document Released: 09/30/2005 Document Revised: 07/21/2013 Document Reviewed: 06/07/2013 ExitCare Patient Information 2015 [employer]. This information is not intended to replace advice given to you by your health care provider. Make sure you discuss any questions you have with your health care provider.  

## 2015-04-18 NOTE — Telephone Encounter (Signed)
Gave and printed appt sched and avs for pt for July and Aug °

## 2015-04-18 NOTE — Telephone Encounter (Signed)
pt called to confirm todays appt...done....pt ok and aware

## 2015-04-18 NOTE — Progress Notes (Signed)
[facility] Telephone:([phone]   [phone]  OFFICE PROGRESS NOTE  [name], NP [phone] [place] [address]  DIAGNOSIS AND STAGE:  #1 Recurrent non-small cell lung cancer consistent with adenocarcinoma. This was initially diagnosed as stage IB (T2a N0 M0) well-differentiated adenocarcinoma of bronchioalveolar type in December 2010.  #2 Acute pulmonary embolism in the right lower lobe lobar, segmental and subsegmental sized pulmonary arteries, diagnosed on 11/04/2011.   PRIOR THERAPY:  1.Status post right upper lobectomy on September 28, 2009 under the care of Dr. Arlyce Dice. The patient refused adjuvant chemotherapy at that time. She had disease recurrence in June of 2012.  2.Status post 4 cycles of systemic chemotherapy with carboplatin for an AUC of 6 and paclitaxel at 200 mg per meter squared and Avastin at 15 mg/kg according to the ECOG protocol #5508.  3. Chemotherapy with Avastin 15 mg/kg according to the ECOG protocol #5508 status post 32 cycles. Last cycle was given on 06/30/2013 discontinued secondary to disease progression and persistent proteinuria. 4. Tarceva 150 mg by mouth daily, started 08/09/2013, status post 10 months of treatment.  CURRENT THERAPY: Tarceva 100 mg by mouth daily started 07/02/2014, status post 10 months.  CHEMOTHERAPY INTENT: Palliative.  CURRENT # OF CHEMOTHERAPY CYCLES: 11 CURRENT ANTIEMETICS: Compazine  CURRENT SMOKING STATUS: Non-smoker  ORAL CHEMOTHERAPY AND CONSENT: Tarceva and consent was signed 07/21/2013.  CURRENT BISPHOSPHONATES USE: None  PAIN MANAGEMENT: No pain  NARCOTICS INDUCED CONSTIPATION: No constipation.  LIVING WILL AND CODE STATUS: Full code   INTERVAL HISTORY: [name] 74 y.o. female returns to the clinic today for routine monthly followup visit. The patient has no significant complaints today. She had some recent symptoms of TIA but MRI of the brain was negative. The  patient is feeling fine today except for mild skin rash mainly on the face and few episodes of diarrhea. She is tolerating her current dose of Tarceva 100 mg by mouth daily fairly well. She denied having any significant nausea or vomiting. She denied having any chest pain but continues to have shortness of breath with exertion with no hemoptysis. The patient denied having any fever or chills. He continues to have issues with her overactive bladder and she is currently followed by Dr. Risa Grill.   MEDICAL HISTORY: Past Medical History  Diagnosis Date  . Hypertension   . Hypothyroidism   . Hypercholesterolemia   . Glaucoma   . Hip pain 09/02/2011  . Foot pain 09/02/2011  . Lung cancer 03/08/2011    recurrent    ALLERGIES:  is allergic to cymbalta; fish allergy; amitriptyline; codeine; and sulfa antibiotics.  MEDICATIONS:  Current Outpatient Prescriptions  Medication Sig Dispense Refill  . amLODipine (NORVASC) 5 MG tablet Take 10 mg by mouth daily. Per [name], NP    . aspirin 81 MG tablet Take 81 mg by mouth daily.    . ciprofloxacin (CIPRO) 500 MG tablet Take 250 mg by mouth 2 (two) times daily.   0  . clonazePAM (KLONOPIN) 0.5 MG tablet Take 0.5 mg by mouth 2 (two) times daily as needed. Per [name], NP    . cyanocobalamin (,VITAMIN B-12,) 1000 MCG/ML injection Inject 1,000 mcg into the muscle every 30 (thirty) days.    . diphenoxylate-atropine (LOMOTIL) 2.5-0.025 MG per tablet Take 1 to 2 tablets by mouth every 6 hours as needed for diarrhea 30 tablet 1  . dorzolamide-timolol (COSOPT) 22.3-6.8 MG/ML ophthalmic solution Place 2 drops into both  eyes 2 (two) times daily. Per [name], NP    . erlotinib (TARCEVA) 100 MG tablet Take 1 tablet (100 mg total) by mouth daily. Take on an empty stomach 1 hour before meals or 2 hours after 30 tablet 0  . fluticasone (FLONASE) 50 MCG/ACT nasal spray Place 1 spray into both nostrils daily as needed for allergies or rhinitis.    Marland Kitchen  levothyroxine (SYNTHROID, LEVOTHROID) 88 MCG tablet Take 88 mcg by mouth daily before breakfast.    . lidocaine-prilocaine (EMLA) cream Apply 1 application topically as needed (prior to port access).     Marland Kitchen lisinopril (PRINIVIL,ZESTRIL) 40 MG tablet Take 40 mg by mouth daily. Per [name], NP     . loperamide (IMODIUM) 2 MG capsule Take 1 capsule (2 mg total) by mouth every 4 (four) hours as needed for diarrhea or loose stools. 30 capsule 0  . naproxen (NAPROSYN) 375 MG tablet Take 1 tablet (375 mg total) by mouth 2 (two) times daily. 20 tablet 0  . ondansetron (ZOFRAN) 4 MG tablet Take 1 tablet (4 mg total) by mouth every 8 (eight) hours as needed for nausea or vomiting. 20 tablet 0  . prochlorperazine (COMPAZINE) 10 MG tablet Take 10 mg by mouth every 6 (six) hours as needed (for nausea).     . TARCEVA 100 MG tablet TAKE 1 TABLET BY MOUTH DAILY ON AN EMPTY STOMACH 30 tablet 2  . Vitamin D, Ergocalciferol, (DRISDOL) 50000 UNITS CAPS capsule   0  . acetaminophen (TYLENOL) 650 MG CR tablet Take 650 mg by mouth once.     No current facility-administered medications for this visit.    SURGICAL HISTORY:  Past Surgical History  Procedure Laterality Date  . Video assisted thoracoscopy  09/28/2009  . Thoracotomy  09/28/2009    mini  . Lung lobectomy  09/28/2009    right upper    REVIEW OF SYSTEMS:  Constitutional: positive for fatigue Eyes: negative Ears, nose, mouth, throat, and face: negative Respiratory: negative Cardiovascular: negative Gastrointestinal: Intermittent diarrhea Genitourinary: Increased urine frequency Integument/breast: Acne-like skin rash on the face especially the nose area. Hematologic/lymphatic: negative Musculoskeletal:negative Neurological: positive for paresthesia Behavioral/Psych: negative Endocrine: negative Allergic/Immunologic: negative   PHYSICAL EXAMINATION: General appearance: alert, cooperative, fatigued and no distress Head: Normocephalic,  without obvious abnormality, atraumatic Neck: no adenopathy, no JVD, supple, symmetrical, trachea midline and thyroid not enlarged, symmetric, no tenderness/mass/nodules Lymph nodes: Cervical, supraclavicular, and axillary nodes normal. Resp: clear to auscultation bilaterally Back: symmetric, no curvature. ROM normal. No CVA tenderness. Cardio: regular rate and rhythm, S1, S2 normal, no murmur, click, rub or gallop GI: soft, non-tender; bowel sounds normal; no masses,  no organomegaly Extremities: extremities normal, atraumatic, no cyanosis or edema Neurologic: Alert and oriented X 3, normal strength and tone. Normal symmetric reflexes. Normal coordination and gait  ECOG PERFORMANCE STATUS: 1 - Symptomatic but completely ambulatory  Blood pressure 137/64, pulse 91, temperature 98.4 F (36.9 C), temperature source Oral, resp. rate 20, height '5\' 5"'$  (1.651 m), weight 130 lb 11.2 oz (59.285 kg), SpO2 95 %.  LABORATORY DATA: Lab Results  Component Value Date   WBC 8.4 04/18/2015   HGB 11.8 04/18/2015   HCT 36.0 04/18/2015   MCV 88.8 04/18/2015   PLT 184 04/18/2015      Chemistry      Component Value Date/Time   NA 141 04/18/2015 1348   NA 141 01/11/2015 1603   NA 142 03/08/2011 0754   K 3.7 04/18/2015 1348  K 3.6 01/11/2015 1603   K 4.2 03/08/2011 0754   CL 105 01/11/2015 1603   CL 107 03/30/2013 1036   CL 102 03/08/2011 0754   CO2 24 04/18/2015 1348   CO2 25 05/25/2014 0332   CO2 27 03/08/2011 0754   BUN 12.1 04/18/2015 1348   BUN 10 01/11/2015 1603   BUN 9 03/08/2011 0754   CREATININE 1.4* 04/18/2015 1348   CREATININE 1.20* 01/11/2015 1603   CREATININE 1.0 03/08/2011 0754      Component Value Date/Time   CALCIUM 9.9 04/18/2015 1348   CALCIUM 9.2 05/25/2014 0332   CALCIUM 9.5 03/08/2011 0754   ALKPHOS 71 04/18/2015 1348   ALKPHOS 67 05/25/2014 0332   ALKPHOS 59 03/08/2011 0754   AST 12 04/18/2015 1348   AST 17 05/25/2014 0332   AST 19 03/08/2011 0754   ALT 12  04/18/2015 1348   ALT 9 05/25/2014 0332   ALT 18 03/08/2011 0754   BILITOT 0.53 04/18/2015 1348   BILITOT 0.4 05/25/2014 0332   BILITOT 0.50 03/08/2011 0754     RADIOLOGY RESULTS: Mr Brain Wo Contrast  03/20/2015   CLINICAL DATA:  Left-sided weakness.  History of lung cancer.  EXAM: MRI HEAD WITHOUT CONTRAST  TECHNIQUE: Multiplanar, multiecho pulse sequences of the brain and surrounding structures were obtained without intravenous contrast.  COMPARISON:  CT head 01/23/2012  FINDINGS: Mild cerebral atrophy, typical for age.  Negative for hydrocephalus.  Pituitary normal in size. No skull lesion identified. Minimal mucosal edema in the paranasal sinuses. Normal orbit bilaterally.  Negative for acute infarct.  Scattered hyperintensities throughout the cerebral white matter bilaterally consistent with chronic microvascular ischemia. 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 Chronic ischemia in the pons bilaterally.  Negative for intracranial hemorrhage or mass lesion. No evidence of metastatic disease on unenhanced imaging. Note that small metastatic deposits can be missed on unenhanced images.  IMPRESSION: Atrophy and chronic ischemia of a moderate degree. No acute infarct or mass.   Electronically Signed   By: Franchot Gallo M.D.   On: 03/20/2015 15:37    ASSESSMENT AND PLAN: This is a very pleasant 74 years old [place] American female with recurrent non-small cell lung cancer most recently treated with maintenance Avastin status post 32 cycles. She is currently on treatment with single agent Tarceva 150 mg by mouth daily status post 10 months and tolerating her treatment fairly well except for mild skin rash and occasional diarrhea. She is tolerating her current treatment with Tarceva 100 milligrams by mouth daily fairly well. She status post 10 months of treatment. I recommended for her to continue Tarceva with the same dose. For diarrhea, she will continue on Imodium when  necessary. For the skin rash, she will continue to apply the clindamycin 1% lotion to the skin rash area twice a day. For the overactive bladder, the patient will continue her follow-up visit with Dr. Risa Grill. She will come back for followup visit in one month for reevaluation with repeat blood work as well as repeat CT scan of the chest. The patient was advised to call immediately if she has any concerning symptoms in the interval. The patient voices understanding of current disease status and treatment options and is in agreement with the current care plan.  All questions were answered. The patient knows to call the clinic with any problems, questions or concerns. We can certainly see the patient much sooner if necessary.  Disclaimer: This note was dictated with voice recognition software.  Similar sounding words can inadvertently be transcribed and may not be corrected upon review.

## 2015-04-18 NOTE — Progress Notes (Signed)
Vitamin B-12 given by flush nurse

## 2015-05-01 ENCOUNTER — Telehealth: Payer: Self-pay | Admitting: Internal Medicine

## 2015-05-01 NOTE — Telephone Encounter (Signed)
pt called to confirm appt....ok and aware

## 2015-05-09 ENCOUNTER — Ambulatory Visit (HOSPITAL_COMMUNITY)
Admission: RE | Admit: 2015-05-09 | Discharge: 2015-05-09 | Disposition: A | Discharge status: Home / Self Care | Payer: Medicare Other | Source: Ambulatory Visit | Attending: Internal Medicine | Admitting: Internal Medicine

## 2015-05-09 ENCOUNTER — Ambulatory Visit (HOSPITAL_BASED_OUTPATIENT_CLINIC_OR_DEPARTMENT_OTHER): Payer: Medicare Other

## 2015-05-09 ENCOUNTER — Encounter (HOSPITAL_COMMUNITY): Payer: Self-pay

## 2015-05-09 ENCOUNTER — Other Ambulatory Visit (HOSPITAL_BASED_OUTPATIENT_CLINIC_OR_DEPARTMENT_OTHER): Payer: Medicare Other

## 2015-05-09 VITALS — BP 123/66 | HR 85 | Temp 98.0°F

## 2015-05-09 DIAGNOSIS — Z95828 Presence of other vascular implants and grafts: Secondary | ICD-10-CM

## 2015-05-09 DIAGNOSIS — C3481 Malignant neoplasm of overlapping sites of right bronchus and lung: Secondary | ICD-10-CM

## 2015-05-09 DIAGNOSIS — Z79899 Other long term (current) drug therapy: Secondary | ICD-10-CM | POA: Diagnosis not present

## 2015-05-09 DIAGNOSIS — C3490 Malignant neoplasm of unspecified part of unspecified bronchus or lung: Secondary | ICD-10-CM | POA: Diagnosis not present

## 2015-05-09 DIAGNOSIS — Z08 Encounter for follow-up examination after completed treatment for malignant neoplasm: Secondary | ICD-10-CM | POA: Insufficient documentation

## 2015-05-09 DIAGNOSIS — C3411 Malignant neoplasm of upper lobe, right bronchus or lung: Secondary | ICD-10-CM

## 2015-05-09 DIAGNOSIS — Z902 Acquired absence of lung [part of]: Secondary | ICD-10-CM | POA: Diagnosis not present

## 2015-05-09 DIAGNOSIS — C3431 Malignant neoplasm of lower lobe, right bronchus or lung: Secondary | ICD-10-CM | POA: Diagnosis not present

## 2015-05-09 LAB — CBC WITH DIFFERENTIAL/PLATELET
BASO%: 0.4 % (ref 0.0–2.0)
Basophils Absolute: 0 10*3/uL (ref 0.0–0.1)
EOS%: 2.4 % (ref 0.0–7.0)
Eosinophils Absolute: 0.2 10*3/uL (ref 0.0–0.5)
HEMATOCRIT: 35.4 % (ref 34.8–46.6)
HGB: 11.8 g/dL (ref 11.6–15.9)
LYMPH%: 22.9 % (ref 14.0–49.7)
MCH: 29.6 pg (ref 25.1–34.0)
MCHC: 33.3 g/dL (ref 31.5–36.0)
MCV: 88.9 fL (ref 79.5–101.0)
MONO#: 0.6 10*3/uL (ref 0.1–0.9)
MONO%: 7.2 % (ref 0.0–14.0)
NEUT%: 67.1 % (ref 38.4–76.8)
NEUTROS ABS: 5.2 10*3/uL (ref 1.5–6.5)
Platelets: 195 10*3/uL (ref 145–400)
RBC: 3.98 10*6/uL (ref 3.70–5.45)
RDW: 14.5 % (ref 11.2–14.5)
WBC: 7.8 10*3/uL (ref 3.9–10.3)
lymph#: 1.8 10*3/uL (ref 0.9–3.3)

## 2015-05-09 LAB — COMPREHENSIVE METABOLIC PANEL (CC13)
ALT: 10 U/L (ref 0–55)
AST: 11 U/L (ref 5–34)
Albumin: 3.6 g/dL (ref 3.5–5.0)
Alkaline Phosphatase: 75 U/L (ref 40–150)
Anion Gap: 7 mEq/L (ref 3–11)
BUN: 10 mg/dL (ref 7.0–26.0)
CALCIUM: 9.7 mg/dL (ref 8.4–10.4)
CHLORIDE: 111 meq/L — AB (ref 98–109)
CO2: 24 mEq/L (ref 22–29)
CREATININE: 1.2 mg/dL — AB (ref 0.6–1.1)
EGFR: 50 mL/min/{1.73_m2} — ABNORMAL LOW (ref >90)
Glucose: 92 mg/dl (ref 70–140)
POTASSIUM: 4 meq/L (ref 3.5–5.1)
Sodium: 142 mEq/L (ref 136–145)
TOTAL PROTEIN: 6.4 g/dL (ref 6.4–8.3)
Total Bilirubin: 0.74 mg/dL (ref 0.20–1.20)

## 2015-05-09 MED ORDER — SODIUM CHLORIDE 0.9 % IJ SOLN
10.0000 mL | INTRAMUSCULAR | Status: DC | PRN
  Administered 2015-05-09: 10 mL via INTRAVENOUS
  Filled 2015-05-09: qty 10

## 2015-05-09 MED ORDER — HEPARIN SOD (PORK) LOCK FLUSH 100 UNIT/ML IV SOLN
500.0000 [IU] | Freq: Once | INTRAVENOUS | Status: AC
  Administered 2015-05-09: 500 [IU] via INTRAVENOUS
  Filled 2015-05-09: qty 5

## 2015-05-09 NOTE — Patient Instructions (Signed)

## 2015-05-09 NOTE — Progress Notes (Signed)
Pt stated that she did not need to be left accessed for CT scan.

## 2015-05-16 ENCOUNTER — Telehealth: Payer: Self-pay | Admitting: Internal Medicine

## 2015-05-16 ENCOUNTER — Ambulatory Visit (HOSPITAL_BASED_OUTPATIENT_CLINIC_OR_DEPARTMENT_OTHER): Payer: Medicare Other | Admitting: Internal Medicine

## 2015-05-16 ENCOUNTER — Ambulatory Visit (HOSPITAL_BASED_OUTPATIENT_CLINIC_OR_DEPARTMENT_OTHER): Payer: Medicare Other

## 2015-05-16 ENCOUNTER — Encounter: Payer: Self-pay | Admitting: Internal Medicine

## 2015-05-16 VITALS — BP 146/62 | HR 76 | Temp 97.8°F | Resp 18 | Ht 65.0 in | Wt 129.6 lb

## 2015-05-16 DIAGNOSIS — R200 Anesthesia of skin: Secondary | ICD-10-CM

## 2015-05-16 DIAGNOSIS — E538 Deficiency of other specified B group vitamins: Secondary | ICD-10-CM

## 2015-05-16 DIAGNOSIS — C3411 Malignant neoplasm of upper lobe, right bronchus or lung: Secondary | ICD-10-CM

## 2015-05-16 DIAGNOSIS — C3490 Malignant neoplasm of unspecified part of unspecified bronchus or lung: Secondary | ICD-10-CM

## 2015-05-16 MED ORDER — CYANOCOBALAMIN 1000 MCG/ML IJ SOLN
1000.0000 ug | Freq: Once | INTRAMUSCULAR | Status: AC
  Administered 2015-05-16: 1000 ug via INTRAMUSCULAR

## 2015-05-16 NOTE — Progress Notes (Signed)
[facility] [phone]   [phone]  OFFICE PROGRESS NOTE  [name], NP [phone] [place] [address]  DIAGNOSIS AND STAGE:  #1 Recurrent non-small cell lung cancer consistent with adenocarcinoma. This was initially diagnosed as stage IB (T2a N0 M0) well-differentiated adenocarcinoma of bronchioalveolar type in December 2010.  #2 Acute pulmonary embolism in the right lower lobe lobar, segmental and subsegmental sized pulmonary arteries, diagnosed on 11/04/2011.   PRIOR THERAPY:  1.Status post right upper lobectomy on September 28, 2009 under the care of Dr. Arlyce Dice. The patient refused adjuvant chemotherapy at that time. She had disease recurrence in June of 2012.  2.Status post 4 cycles of systemic chemotherapy with carboplatin for an AUC of 6 and paclitaxel at 200 mg per meter squared and Avastin at 15 mg/kg according to the ECOG protocol #5508.  3. Chemotherapy with Avastin 15 mg/kg according to the ECOG protocol #5508 status post 32 cycles. Last cycle was given on 06/30/2013 discontinued secondary to disease progression and persistent proteinuria. 4. Tarceva 150 mg by mouth daily, started 08/09/2013, status post 10 months of treatment.  CURRENT THERAPY: Tarceva 100 mg by mouth daily started 07/02/2014, status post 11 months.  CHEMOTHERAPY INTENT: Palliative.  CURRENT # OF CHEMOTHERAPY CYCLES: 12 CURRENT ANTIEMETICS: Compazine  CURRENT SMOKING STATUS: Non-smoker  ORAL CHEMOTHERAPY AND CONSENT: Tarceva and consent was signed 07/21/2013.  CURRENT BISPHOSPHONATES USE: None  PAIN MANAGEMENT: No pain  NARCOTICS INDUCED CONSTIPATION: No constipation.  LIVING WILL AND CODE STATUS: Full code   INTERVAL HISTORY: [name] 74 y.o. female returns to the clinic today for routine monthly followup visit. The patient has no significant complaints today. The patient is feeling fine today except for mild skin rash mainly on the face  and few episodes of diarrhea which occurs every few weeks. She is tolerating her current dose of Tarceva 100 mg by mouth daily fairly well. She denied having any significant nausea or vomiting. She denied having any chest pain but continues to have shortness of breath with exertion with no hemoptysis. The patient denied having any fever or chills. She had repeat CT scan of the chest performed recently and she is here for evaluation and discussion of her scan results.  MEDICAL HISTORY: Past Medical History  Diagnosis Date  . Hypertension   . Hypothyroidism   . Hypercholesterolemia   . Glaucoma   . Hip pain 09/02/2011  . Foot pain 09/02/2011  . Lung cancer 03/08/2011    recurrent    ALLERGIES:  is allergic to cymbalta; fish allergy; amitriptyline; codeine; and sulfa antibiotics.  MEDICATIONS:  Current Outpatient Prescriptions  Medication Sig Dispense Refill  . acetaminophen (TYLENOL) 650 MG CR tablet Take 650 mg by mouth once.    Marland Kitchen amLODipine (NORVASC) 5 MG tablet Take 10 mg by mouth daily. Per [name], NP    . aspirin 81 MG tablet Take 81 mg by mouth daily.    . clonazePAM (KLONOPIN) 0.5 MG tablet Take 0.5 mg by mouth 2 (two) times daily as needed. Per [name], NP    . cyanocobalamin (,VITAMIN B-12,) 1000 MCG/ML injection Inject 1,000 mcg into the muscle every 30 (thirty) days.    . diphenoxylate-atropine (LOMOTIL) 2.5-0.025 MG per tablet Take 1 to 2 tablets by mouth every 6 hours as needed for diarrhea 30 tablet 1  . dorzolamide-timolol (COSOPT) 22.3-6.8 MG/ML ophthalmic solution Place 2 drops into both eyes 2 (two) times daily. Per [name],  NP    . erlotinib (TARCEVA) 100 MG tablet Take 1 tablet (100 mg total) by mouth daily. Take on an empty stomach 1 hour before meals or 2 hours after 30 tablet 0  . fluticasone (FLONASE) 50 MCG/ACT nasal spray Place 1 spray into both nostrils daily as needed for allergies or rhinitis.    Marland Kitchen levothyroxine (SYNTHROID, LEVOTHROID) 88 MCG  tablet Take 88 mcg by mouth daily before breakfast.    . lidocaine-prilocaine (EMLA) cream Apply 1 application topically as needed (prior to port access).     Marland Kitchen lisinopril (PRINIVIL,ZESTRIL) 40 MG tablet Take 40 mg by mouth daily. Per [name], NP     . loperamide (IMODIUM) 2 MG capsule Take 1 capsule (2 mg total) by mouth every 4 (four) hours as needed for diarrhea or loose stools. 30 capsule 0  . naproxen (NAPROSYN) 375 MG tablet Take 1 tablet (375 mg total) by mouth 2 (two) times daily. 20 tablet 0  . ondansetron (ZOFRAN) 4 MG tablet Take 1 tablet (4 mg total) by mouth every 8 (eight) hours as needed for nausea or vomiting. 20 tablet 0  . prochlorperazine (COMPAZINE) 10 MG tablet Take 10 mg by mouth every 6 (six) hours as needed (for nausea).     . TARCEVA 100 MG tablet TAKE 1 TABLET BY MOUTH DAILY ON AN EMPTY STOMACH 30 tablet 2   No current facility-administered medications for this visit.    SURGICAL HISTORY:  Past Surgical History  Procedure Laterality Date  . Video assisted thoracoscopy  09/28/2009  . Thoracotomy  09/28/2009    mini  . Lung lobectomy  09/28/2009    right upper    REVIEW OF SYSTEMS:  Constitutional: positive for fatigue Eyes: negative Ears, nose, mouth, throat, and face: negative Respiratory: negative Cardiovascular: negative Gastrointestinal: Intermittent diarrhea Genitourinary: Increased urine frequency Integument/breast: Acne-like skin rash on the face especially the nose area. Hematologic/lymphatic: negative Musculoskeletal:negative Neurological: positive for paresthesia Behavioral/Psych: negative Endocrine: negative Allergic/Immunologic: negative   PHYSICAL EXAMINATION: General appearance: alert, cooperative, fatigued and no distress Head: Normocephalic, without obvious abnormality, atraumatic Neck: no adenopathy, no JVD, supple, symmetrical, trachea midline and thyroid not enlarged, symmetric, no tenderness/mass/nodules Lymph nodes: Cervical,  supraclavicular, and axillary nodes normal. Resp: clear to auscultation bilaterally Back: symmetric, no curvature. ROM normal. No CVA tenderness. Cardio: regular rate and rhythm, S1, S2 normal, no murmur, click, rub or gallop GI: soft, non-tender; bowel sounds normal; no masses,  no organomegaly Extremities: extremities normal, atraumatic, no cyanosis or edema Neurologic: Alert and oriented X 3, normal strength and tone. Normal symmetric reflexes. Normal coordination and gait  ECOG PERFORMANCE STATUS: 1 - Symptomatic but completely ambulatory  Blood pressure 146/62, pulse 76, temperature 97.8 F (36.6 C), temperature source Oral, resp. rate 18, height '5\' 5"'$  (1.651 m), weight 129 lb 9.6 oz (58.786 kg), SpO2 99 %.  LABORATORY DATA: Lab Results  Component Value Date   WBC 7.8 05/09/2015   HGB 11.8 05/09/2015   HCT 35.4 05/09/2015   MCV 88.9 05/09/2015   PLT 195 05/09/2015      Chemistry      Component Value Date/Time   NA 142 05/09/2015 0945   NA 141 01/11/2015 1603   NA 142 03/08/2011 0754   K 4.0 05/09/2015 0945   K 3.6 01/11/2015 1603   K 4.2 03/08/2011 0754   CL 105 01/11/2015 1603   CL 107 03/30/2013 1036   CL 102 03/08/2011 0754   CO2 24 05/09/2015 0945   CO2  25 05/25/2014 0332   CO2 27 03/08/2011 0754   BUN 10.0 05/09/2015 0945   BUN 10 01/11/2015 1603   BUN 9 03/08/2011 0754   CREATININE 1.2* 05/09/2015 0945   CREATININE 1.20* 01/11/2015 1603   CREATININE 1.0 03/08/2011 0754      Component Value Date/Time   CALCIUM 9.7 05/09/2015 0945   CALCIUM 9.2 05/25/2014 0332   CALCIUM 9.5 03/08/2011 0754   ALKPHOS 75 05/09/2015 0945   ALKPHOS 67 05/25/2014 0332   ALKPHOS 59 03/08/2011 0754   AST 11 05/09/2015 0945   AST 17 05/25/2014 0332   AST 19 03/08/2011 0754   ALT 10 05/09/2015 0945   ALT 9 05/25/2014 0332   ALT 18 03/08/2011 0754   BILITOT 0.74 05/09/2015 0945   BILITOT 0.4 05/25/2014 0332   BILITOT 0.50 03/08/2011 0754     RADIOLOGY RESULTS: Ct Chest  Wo Contrast  05/09/2015   CLINICAL DATA:  Subsequent treatment strategy for lung carcinoma. Patient status post right lobectomy 2010. Oral chemotherapy ongoing.  EXAM: CT CHEST WITHOUT CONTRAST  TECHNIQUE: Multidetector CT imaging of the chest was performed following the standard protocol without IV contrast.  COMPARISON:  02/17/2015, 11/15/2013  FINDINGS: Mediastinum/Nodes: No axillary or supraclavicular adenopathy. Port in the right chest wall. No mediastinal or hilar adenopathy. No pericardial fluid. Esophagus normal.  Lungs/Pleura: Multiple irregular nodules are present. These nodules are solid and semi-solid associated with curvilinear scarring. Example lesion in the left lower lobe measures 28 x 21 mm on image 23, series 5 not changed from 24 x 36 mm. Lesion in the right lower lobe measures 29 x 18 mm not changed from 29 x 18 mm. Example lesion in the right upper lobe measuring 18 mm on image 21, series 5 compares to 18 mm. No new pulmonary nodules are present.  Upper abdomen: Scattered granulomata within the liver and spleen. Low-density lesion the right hepatic lobe is unchanged. Adrenal glands are normal.  Musculoskeletal: No aggressive osseous lesion.  IMPRESSION: 1. No change in bilateral solid and sub solid nodularity and scarring. No evidence of disease progression or new lesions. 2. No mediastinal lymphadenopathy.   Electronically Signed   By: Suzy Bouchard M.D.   On: 05/09/2015 11:16    ASSESSMENT AND PLAN: This is a very pleasant 74 years old [place] American female with recurrent non-small cell lung cancer most recently treated with maintenance Avastin status post 32 cycles. She is currently on treatment with single agent Tarceva 150 mg by mouth daily status post 10 months and tolerating her treatment fairly well except for mild skin rash and occasional diarrhea. Her recent CT scan of the chest showed no evidence for disease progression. She is tolerating her current treatment with Tarceva  100 milligrams by mouth daily fairly well. She status post 11 months of treatment. I recommended for her to continue Tarceva with the same dose. For diarrhea, she will continue on Imodium when necessary. For the skin rash, she will continue to apply the clindamycin 1% lotion to the skin rash area twice a day. For the overactive bladder, the patient will continue her follow-up visit with Dr. Risa Grill. The patient was advised to call immediately if she has any concerning symptoms in the interval. The patient voices understanding of current disease status and treatment options and is in agreement with the current care plan.  All questions were answered. The patient knows to call the clinic with any problems, questions or concerns. We can certainly see the patient much sooner  if necessary.  Disclaimer: This note was dictated with voice recognition software. Similar sounding words can inadvertently be transcribed and may not be corrected upon review.

## 2015-05-16 NOTE — Telephone Encounter (Signed)
per pof to sch pt appt-gave pt copy of avs °

## 2015-05-17 ENCOUNTER — Ambulatory Visit: Payer: Medicare Other

## 2015-05-17 ENCOUNTER — Ambulatory Visit: Payer: Medicare Other | Admitting: Internal Medicine

## 2015-05-30 ENCOUNTER — Other Ambulatory Visit: Payer: Self-pay | Admitting: Internal Medicine

## 2015-05-30 DIAGNOSIS — R351 Nocturia: Secondary | ICD-10-CM | POA: Diagnosis not present

## 2015-05-30 DIAGNOSIS — R350 Frequency of micturition: Secondary | ICD-10-CM | POA: Diagnosis not present

## 2015-06-14 ENCOUNTER — Telehealth: Payer: Self-pay | Admitting: Internal Medicine

## 2015-06-14 ENCOUNTER — Encounter: Payer: Self-pay | Admitting: Internal Medicine

## 2015-06-14 ENCOUNTER — Other Ambulatory Visit (HOSPITAL_BASED_OUTPATIENT_CLINIC_OR_DEPARTMENT_OTHER): Payer: Medicare Other

## 2015-06-14 ENCOUNTER — Ambulatory Visit (HOSPITAL_BASED_OUTPATIENT_CLINIC_OR_DEPARTMENT_OTHER): Payer: Medicare Other | Admitting: Internal Medicine

## 2015-06-14 ENCOUNTER — Ambulatory Visit (HOSPITAL_BASED_OUTPATIENT_CLINIC_OR_DEPARTMENT_OTHER): Payer: Medicare Other

## 2015-06-14 VITALS — BP 140/66 | HR 76 | Resp 17 | Ht 65.0 in | Wt 128.9 lb

## 2015-06-14 DIAGNOSIS — C3410 Malignant neoplasm of upper lobe, unspecified bronchus or lung: Secondary | ICD-10-CM

## 2015-06-14 DIAGNOSIS — R200 Anesthesia of skin: Secondary | ICD-10-CM

## 2015-06-14 DIAGNOSIS — C3490 Malignant neoplasm of unspecified part of unspecified bronchus or lung: Secondary | ICD-10-CM

## 2015-06-14 DIAGNOSIS — C3411 Malignant neoplasm of upper lobe, right bronchus or lung: Secondary | ICD-10-CM

## 2015-06-14 DIAGNOSIS — R21 Rash and other nonspecific skin eruption: Secondary | ICD-10-CM | POA: Diagnosis not present

## 2015-06-14 DIAGNOSIS — E538 Deficiency of other specified B group vitamins: Secondary | ICD-10-CM

## 2015-06-14 DIAGNOSIS — R197 Diarrhea, unspecified: Secondary | ICD-10-CM

## 2015-06-14 LAB — CBC WITH DIFFERENTIAL/PLATELET
BASO%: 1 % (ref 0.0–2.0)
Basophils Absolute: 0.1 10*3/uL (ref 0.0–0.1)
EOS ABS: 0.2 10*3/uL (ref 0.0–0.5)
EOS%: 2.7 % (ref 0.0–7.0)
HCT: 36.5 % (ref 34.8–46.6)
HGB: 12 g/dL (ref 11.6–15.9)
LYMPH#: 1.6 10*3/uL (ref 0.9–3.3)
LYMPH%: 20.5 % (ref 14.0–49.7)
MCH: 29.3 pg (ref 25.1–34.0)
MCHC: 32.8 g/dL (ref 31.5–36.0)
MCV: 89.4 fL (ref 79.5–101.0)
MONO#: 0.5 10*3/uL (ref 0.1–0.9)
MONO%: 6.7 % (ref 0.0–14.0)
NEUT%: 69.1 % (ref 38.4–76.8)
NEUTROS ABS: 5.3 10*3/uL (ref 1.5–6.5)
PLATELETS: 186 10*3/uL (ref 145–400)
RBC: 4.08 10*6/uL (ref 3.70–5.45)
RDW: 14.6 % — ABNORMAL HIGH (ref 11.2–14.5)
WBC: 7.7 10*3/uL (ref 3.9–10.3)

## 2015-06-14 LAB — COMPREHENSIVE METABOLIC PANEL (CC13)
ALT: 15 U/L (ref 0–55)
ANION GAP: 8 meq/L (ref 3–11)
AST: 17 U/L (ref 5–34)
Albumin: 3.9 g/dL (ref 3.5–5.0)
Alkaline Phosphatase: 68 U/L (ref 40–150)
BILIRUBIN TOTAL: 0.75 mg/dL (ref 0.20–1.20)
BUN: 13 mg/dL (ref 7.0–26.0)
CO2: 25 mEq/L (ref 22–29)
CREATININE: 1.2 mg/dL — AB (ref 0.6–1.1)
Calcium: 10 mg/dL (ref 8.4–10.4)
Chloride: 108 mEq/L (ref 98–109)
EGFR: 49 mL/min/{1.73_m2} — ABNORMAL LOW (ref >90)
Glucose: 91 mg/dl (ref 70–140)
Potassium: 4.1 mEq/L (ref 3.5–5.1)
Sodium: 141 mEq/L (ref 136–145)
TOTAL PROTEIN: 6.7 g/dL (ref 6.4–8.3)

## 2015-06-14 MED ORDER — HEPARIN SOD (PORK) LOCK FLUSH 100 UNIT/ML IV SOLN
500.0000 [IU] | Freq: Once | INTRAVENOUS | Status: AC
  Administered 2015-06-14: 500 [IU] via INTRAVENOUS
  Filled 2015-06-14: qty 5

## 2015-06-14 MED ORDER — CYANOCOBALAMIN 1000 MCG/ML IJ SOLN
1000.0000 ug | Freq: Once | INTRAMUSCULAR | Status: AC
  Administered 2015-06-14: 1000 ug via INTRAMUSCULAR

## 2015-06-14 MED ORDER — SODIUM CHLORIDE 0.9 % IJ SOLN
10.0000 mL | INTRAMUSCULAR | Status: DC | PRN
  Administered 2015-06-14: 10 mL via INTRAVENOUS
  Filled 2015-06-14: qty 10

## 2015-06-14 NOTE — Telephone Encounter (Signed)
No pof sent today. Patient is on a monthly schedule. Added f/u to lab/inj for 9/28. Message to MM. Gave patient avs report and appointments for September.

## 2015-06-14 NOTE — Progress Notes (Signed)
[facility] [phone]   [phone]  OFFICE PROGRESS NOTE  Terri Levels, NP [phone] [place] [address]  DIAGNOSIS AND STAGE:  #1 Recurrent non-small cell lung cancer consistent with adenocarcinoma. This was initially diagnosed as stage IB (T2a N0 M0) well-differentiated adenocarcinoma of bronchioalveolar type in December 2010.  #2 Acute pulmonary embolism in the right lower lobe lobar, segmental and subsegmental sized pulmonary arteries, diagnosed on 11/04/2011.   PRIOR THERAPY:  1.Status post right upper lobectomy on September 28, 2009 under the care of Dr. Arlyce Dice. The patient refused adjuvant chemotherapy at that time. She had disease recurrence in June of 2012.  2.Status post 4 cycles of systemic chemotherapy with carboplatin for an AUC of 6 and paclitaxel at 200 mg per meter squared and Avastin at 15 mg/kg according to the ECOG protocol #5508.  3. Chemotherapy with Avastin 15 mg/kg according to the ECOG protocol #5508 status post 32 cycles. Last cycle was given on 06/30/2013 discontinued secondary to disease progression and persistent proteinuria. 4. Tarceva 150 mg by mouth daily, started 08/09/2013, status post 10 months of treatment.  CURRENT THERAPY: Tarceva 100 mg by mouth daily started 07/02/2014, status post 12 months.  CHEMOTHERAPY INTENT: Palliative.  CURRENT # OF CHEMOTHERAPY CYCLES: 13 CURRENT ANTIEMETICS: Compazine  CURRENT SMOKING STATUS: Non-smoker  ORAL CHEMOTHERAPY AND CONSENT: Tarceva and consent was signed 07/21/2013.  CURRENT BISPHOSPHONATES USE: None  PAIN MANAGEMENT: No pain  NARCOTICS INDUCED CONSTIPATION: No constipation.  LIVING WILL AND CODE STATUS: Full code   INTERVAL HISTORY: [name] 74 y.o. female returns to the clinic today for routine monthly followup visit. The patient has no significant complaints today. She was recently treated for urinary tract infection and had several episodes  of diarrhea secondary to the antibiotics. Her diarrhea has improved. The patient is feeling fine today except for mild skin rash mainly on the face. She is tolerating her current dose of Tarceva 100 mg by mouth daily fairly well. She denied having any significant nausea or vomiting. She denied having any chest pain but continues to have shortness of breath with exertion with no hemoptysis. The patient denied having any fever or chills. She had repeat CBC and complaints metabolic panel performed earlier today and she is here for evaluation and discussion of her lab results.  MEDICAL HISTORY: Past Medical History  Diagnosis Date  . Hypertension   . Hypothyroidism   . Hypercholesterolemia   . Glaucoma   . Hip pain 09/02/2011  . Foot pain 09/02/2011  . Lung cancer 03/08/2011    recurrent    ALLERGIES:  is allergic to cymbalta; fish allergy; amitriptyline; codeine; and sulfa antibiotics.  MEDICATIONS:  Current Outpatient Prescriptions  Medication Sig Dispense Refill  . acetaminophen (TYLENOL) 650 MG CR tablet Take 650 mg by mouth once.    Marland Kitchen amLODipine (NORVASC) 5 MG tablet Take 10 mg by mouth daily. Per [name], NP    . aspirin 81 MG tablet Take 81 mg by mouth daily.    . clonazePAM (KLONOPIN) 0.5 MG tablet Take 0.5 mg by mouth 2 (two) times daily as needed. Per [name], NP    . cyanocobalamin (,VITAMIN B-12,) 1000 MCG/ML injection Inject 1,000 mcg into the muscle every 30 (thirty) days.    . diphenoxylate-atropine (LOMOTIL) 2.5-0.025 MG per tablet Take 1 to 2 tablets by mouth every 6 hours as needed for diarrhea 30 tablet 1  . dorzolamide-timolol (COSOPT) 22.3-6.8 MG/ML ophthalmic solution  Place 2 drops into both eyes 2 (two) times daily. Per [name], NP    . erlotinib (TARCEVA) 100 MG tablet Take 1 tablet (100 mg total) by mouth daily. Take on an empty stomach 1 hour before meals or 2 hours after 30 tablet 0  . fluticasone (FLONASE) 50 MCG/ACT nasal spray Place 1 spray into  both nostrils daily as needed for allergies or rhinitis.    Marland Kitchen levothyroxine (SYNTHROID, LEVOTHROID) 88 MCG tablet Take 88 mcg by mouth daily before breakfast.    . lidocaine-prilocaine (EMLA) cream Apply 1 application topically as needed (prior to port access).     Marland Kitchen lisinopril (PRINIVIL,ZESTRIL) 40 MG tablet Take 40 mg by mouth daily. Per [name], NP     . loperamide (IMODIUM) 2 MG capsule Take 1 capsule (2 mg total) by mouth every 4 (four) hours as needed for diarrhea or loose stools. 30 capsule 0  . naproxen (NAPROSYN) 375 MG tablet Take 1 tablet (375 mg total) by mouth 2 (two) times daily. 20 tablet 0  . ondansetron (ZOFRAN) 4 MG tablet Take 1 tablet (4 mg total) by mouth every 8 (eight) hours as needed for nausea or vomiting. 20 tablet 0  . prochlorperazine (COMPAZINE) 10 MG tablet Take 10 mg by mouth every 6 (six) hours as needed (for nausea).     . TARCEVA 100 MG tablet TAKE 1 TABLET BY MOUTH DAILY ON AN EMPTY STOMACH 30 tablet 2  . TARCEVA 100 MG tablet TAKE 1 TABLET BY MOUTH DAILY ON AN EMPTY STOMACH 30 tablet 1   No current facility-administered medications for this visit.    SURGICAL HISTORY:  Past Surgical History  Procedure Laterality Date  . Video assisted thoracoscopy  09/28/2009  . Thoracotomy  09/28/2009    mini  . Lung lobectomy  09/28/2009    right upper    REVIEW OF SYSTEMS:  Constitutional: positive for fatigue Eyes: negative Ears, nose, mouth, throat, and face: negative Respiratory: negative Cardiovascular: negative Gastrointestinal: Intermittent diarrhea Genitourinary: Increased urine frequency Integument/breast: Acne-like skin rash on the face especially the nose area. Hematologic/lymphatic: negative Musculoskeletal:negative Neurological: positive for paresthesia Behavioral/Psych: negative Endocrine: negative Allergic/Immunologic: negative   PHYSICAL EXAMINATION: General appearance: alert, cooperative, fatigued and no distress Head: Normocephalic,  without obvious abnormality, atraumatic Neck: no adenopathy, no JVD, supple, symmetrical, trachea midline and thyroid not enlarged, symmetric, no tenderness/mass/nodules Lymph nodes: Cervical, supraclavicular, and axillary nodes normal. Resp: clear to auscultation bilaterally Back: symmetric, no curvature. ROM normal. No CVA tenderness. Cardio: regular rate and rhythm, S1, S2 normal, no murmur, click, rub or gallop GI: soft, non-tender; bowel sounds normal; no masses,  no organomegaly Extremities: extremities normal, atraumatic, no cyanosis or edema Neurologic: Alert and oriented X 3, normal strength and tone. Normal symmetric reflexes. Normal coordination and gait  ECOG PERFORMANCE STATUS: 1 - Symptomatic but completely ambulatory  Blood pressure 140/66, pulse 76, resp. rate 17, height '5\' 5"'$  (1.651 m), weight 128 lb 14.4 oz (58.469 kg), SpO2 99 %.  LABORATORY DATA: Lab Results  Component Value Date   WBC 7.7 06/14/2015   HGB 12.0 06/14/2015   HCT 36.5 06/14/2015   MCV 89.4 06/14/2015   PLT 186 06/14/2015      Chemistry      Component Value Date/Time   NA 142 05/09/2015 0945   NA 141 01/11/2015 1603   NA 142 03/08/2011 0754   K 4.0 05/09/2015 0945   K 3.6 01/11/2015 1603   K 4.2 03/08/2011 0754   CL  105 01/11/2015 1603   CL 107 03/30/2013 1036   CL 102 03/08/2011 0754   CO2 24 05/09/2015 0945   CO2 25 05/25/2014 0332   CO2 27 03/08/2011 0754   BUN 10.0 05/09/2015 0945   BUN 10 01/11/2015 1603   BUN 9 03/08/2011 0754   CREATININE 1.2* 05/09/2015 0945   CREATININE 1.20* 01/11/2015 1603   CREATININE 1.0 03/08/2011 0754      Component Value Date/Time   CALCIUM 9.7 05/09/2015 0945   CALCIUM 9.2 05/25/2014 0332   CALCIUM 9.5 03/08/2011 0754   ALKPHOS 75 05/09/2015 0945   ALKPHOS 67 05/25/2014 0332   ALKPHOS 59 03/08/2011 0754   AST 11 05/09/2015 0945   AST 17 05/25/2014 0332   AST 19 03/08/2011 0754   ALT 10 05/09/2015 0945   ALT 9 05/25/2014 0332   ALT 18  03/08/2011 0754   BILITOT 0.74 05/09/2015 0945   BILITOT 0.4 05/25/2014 0332   BILITOT 0.50 03/08/2011 0754     RADIOLOGY RESULTS: No results found.  ASSESSMENT AND PLAN: This is a very pleasant 74 years old [place] American female with recurrent non-small cell lung cancer most recently treated with maintenance Avastin status post 32 cycles. She is currently on treatment with single agent Tarceva 150 mg by mouth daily status post 10 months and tolerating her treatment fairly well except for mild skin rash and occasional diarrhea. She is tolerating her current treatment with Tarceva 100 milligrams by mouth daily fairly well. She status post 12 months of treatment. I recommended for her to continue Tarceva with the same dose. For diarrhea, she will continue on Imodium when necessary. For the skin rash, she will continue to apply the clindamycin 1% lotion to the skin rash area twice a day. For the overactive bladder and urinary tract infection, the patient will continue her follow-up visit with Dr. Risa Grill. The patient was advised to call immediately if she has any concerning symptoms in the interval. The patient voices understanding of current disease status and treatment options and is in agreement with the current care plan.  All questions were answered. The patient knows to call the clinic with any problems, questions or concerns. We can certainly see the patient much sooner if necessary.  Disclaimer: This note was dictated with voice recognition software. Similar sounding words can inadvertently be transcribed and may not be corrected upon review.

## 2015-06-14 NOTE — Addendum Note (Signed)
Addended by: Ardeen Garland on: 06/14/2015 10:19 AM   Modules accepted: Medications

## 2015-06-16 DIAGNOSIS — R350 Frequency of micturition: Secondary | ICD-10-CM | POA: Diagnosis not present

## 2015-06-16 DIAGNOSIS — R351 Nocturia: Secondary | ICD-10-CM | POA: Diagnosis not present

## 2015-07-07 DIAGNOSIS — R351 Nocturia: Secondary | ICD-10-CM | POA: Diagnosis not present

## 2015-07-07 DIAGNOSIS — R350 Frequency of micturition: Secondary | ICD-10-CM | POA: Diagnosis not present

## 2015-07-10 DIAGNOSIS — R350 Frequency of micturition: Secondary | ICD-10-CM | POA: Diagnosis not present

## 2015-07-10 DIAGNOSIS — N3020 Other chronic cystitis without hematuria: Secondary | ICD-10-CM | POA: Diagnosis not present

## 2015-07-10 DIAGNOSIS — R351 Nocturia: Secondary | ICD-10-CM | POA: Diagnosis not present

## 2015-07-12 ENCOUNTER — Other Ambulatory Visit: Payer: Medicare Other

## 2015-07-12 ENCOUNTER — Ambulatory Visit: Payer: Medicare Other

## 2015-07-12 ENCOUNTER — Ambulatory Visit: Payer: Medicare Other | Admitting: Internal Medicine

## 2015-07-12 ENCOUNTER — Telehealth: Payer: Self-pay | Admitting: Internal Medicine

## 2015-07-12 NOTE — Telephone Encounter (Signed)
pt called to r/s appt...done....pt ok and aware of new d.t °

## 2015-07-17 ENCOUNTER — Ambulatory Visit: Payer: Medicare Other

## 2015-07-17 ENCOUNTER — Ambulatory Visit (HOSPITAL_BASED_OUTPATIENT_CLINIC_OR_DEPARTMENT_OTHER): Payer: Medicare Other | Admitting: Internal Medicine

## 2015-07-17 ENCOUNTER — Telehealth: Payer: Self-pay | Admitting: Internal Medicine

## 2015-07-17 ENCOUNTER — Other Ambulatory Visit (HOSPITAL_BASED_OUTPATIENT_CLINIC_OR_DEPARTMENT_OTHER): Payer: Medicare Other

## 2015-07-17 ENCOUNTER — Encounter: Payer: Self-pay | Admitting: Internal Medicine

## 2015-07-17 VITALS — BP 118/55 | HR 69 | Temp 97.7°F | Resp 18 | Ht 65.0 in | Wt 129.0 lb

## 2015-07-17 DIAGNOSIS — C3411 Malignant neoplasm of upper lobe, right bronchus or lung: Secondary | ICD-10-CM | POA: Diagnosis not present

## 2015-07-17 DIAGNOSIS — R200 Anesthesia of skin: Secondary | ICD-10-CM

## 2015-07-17 DIAGNOSIS — E538 Deficiency of other specified B group vitamins: Secondary | ICD-10-CM | POA: Diagnosis not present

## 2015-07-17 DIAGNOSIS — R21 Rash and other nonspecific skin eruption: Secondary | ICD-10-CM

## 2015-07-17 DIAGNOSIS — C3410 Malignant neoplasm of upper lobe, unspecified bronchus or lung: Secondary | ICD-10-CM

## 2015-07-17 LAB — COMPREHENSIVE METABOLIC PANEL (CC13)
ALBUMIN: 3.9 g/dL (ref 3.5–5.0)
ALT: 14 U/L (ref 0–55)
AST: 14 U/L (ref 5–34)
Alkaline Phosphatase: 63 U/L (ref 40–150)
Anion Gap: 7 mEq/L (ref 3–11)
BUN: 11.2 mg/dL (ref 7.0–26.0)
CHLORIDE: 107 meq/L (ref 98–109)
CO2: 26 meq/L (ref 22–29)
Calcium: 9.8 mg/dL (ref 8.4–10.4)
Creatinine: 1.2 mg/dL — ABNORMAL HIGH (ref 0.6–1.1)
EGFR: 53 mL/min/{1.73_m2} — AB (ref >90)
GLUCOSE: 74 mg/dL (ref 70–140)
POTASSIUM: 3.6 meq/L (ref 3.5–5.1)
SODIUM: 140 meq/L (ref 136–145)
Total Bilirubin: 0.84 mg/dL (ref 0.20–1.20)
Total Protein: 6.7 g/dL (ref 6.4–8.3)

## 2015-07-17 LAB — CBC WITH DIFFERENTIAL/PLATELET
BASO%: 0.3 % (ref 0.0–2.0)
BASOS ABS: 0 10*3/uL (ref 0.0–0.1)
EOS%: 3.6 % (ref 0.0–7.0)
Eosinophils Absolute: 0.4 10*3/uL (ref 0.0–0.5)
HCT: 36.3 % (ref 34.8–46.6)
HEMOGLOBIN: 12 g/dL (ref 11.6–15.9)
LYMPH%: 12.8 % — AB (ref 14.0–49.7)
MCH: 29.9 pg (ref 25.1–34.0)
MCHC: 33.1 g/dL (ref 31.5–36.0)
MCV: 90.3 fL (ref 79.5–101.0)
MONO#: 0.8 10*3/uL (ref 0.1–0.9)
MONO%: 7.4 % (ref 0.0–14.0)
NEUT#: 8.3 10*3/uL — ABNORMAL HIGH (ref 1.5–6.5)
NEUT%: 75.9 % (ref 38.4–76.8)
Platelets: 208 10*3/uL (ref 145–400)
RBC: 4.02 10*6/uL (ref 3.70–5.45)
RDW: 14.8 % — AB (ref 11.2–14.5)
WBC: 11 10*3/uL — ABNORMAL HIGH (ref 3.9–10.3)
lymph#: 1.4 10*3/uL (ref 0.9–3.3)

## 2015-07-17 MED ORDER — CYANOCOBALAMIN 1000 MCG/ML IJ SOLN
1000.0000 ug | Freq: Once | INTRAMUSCULAR | Status: AC
  Administered 2015-07-17: 1000 ug via INTRAMUSCULAR

## 2015-07-17 NOTE — Progress Notes (Signed)
Given by desk nurse during MD visit.

## 2015-07-17 NOTE — Progress Notes (Signed)
[facility] Telephone:([phone]) [phone]   Fax:(336) [phone]  OFFICE PROGRESS NOTE  [name], NP [phone] [place] [address]  DIAGNOSIS AND STAGE:  #1 Recurrent non-small cell lung cancer consistent with adenocarcinoma. This was initially diagnosed as stage IB (T2a N0 M0) well-differentiated adenocarcinoma of bronchioalveolar type in December 2010.  #2 Acute pulmonary embolism in the right lower lobe lobar, segmental and subsegmental sized pulmonary arteries, diagnosed on 11/04/2011.   PRIOR THERAPY:  1.Status post right upper lobectomy on September 28, 2009 under the care of Dr. Arlyce Dice. The patient refused adjuvant chemotherapy at that time. She had disease recurrence in June of 2012.  2.Status post 4 cycles of systemic chemotherapy with carboplatin for an AUC of 6 and paclitaxel at 200 mg per meter squared and Avastin at 15 mg/kg according to the ECOG protocol #5508.  3. Chemotherapy with Avastin 15 mg/kg according to the ECOG protocol #5508 status post 32 cycles. Last cycle was given on 06/30/2013 discontinued secondary to disease progression and persistent proteinuria. 4. Tarceva 150 mg by mouth daily, started 08/09/2013, status post 10 months of treatment.  CURRENT THERAPY: Tarceva 100 mg by mouth daily started 07/02/2014, status post 13 months.  CHEMOTHERAPY INTENT: Palliative.  CURRENT # OF CHEMOTHERAPY CYCLES: 14 CURRENT ANTIEMETICS: Compazine  CURRENT SMOKING STATUS: Non-smoker  ORAL CHEMOTHERAPY AND CONSENT: Tarceva and consent was signed 07/21/2013.  CURRENT BISPHOSPHONATES USE: None  PAIN MANAGEMENT: No pain  NARCOTICS INDUCED CONSTIPATION: No constipation.  LIVING WILL AND CODE STATUS: Full code   INTERVAL HISTORY: [name] 74 y.o. female returns to the clinic today for routine monthly followup visit. The patient has no significant complaints today. She was recently treated for urinary tract infection by Dr. Risa Grill with  Macrobid. She had few episodes of diarrhea every now and then resolved with Imodium. The patient is feeling fine today except for mild skin rash mainly on the face. She is tolerating her current dose of Tarceva 100 mg by mouth daily fairly well. She denied having any significant nausea or vomiting. She denied having any chest pain but continues to have shortness of breath with exertion with no hemoptysis. The patient denied having any fever or chills. She had repeat CBC and complaints metabolic panel performed earlier today and she is here for evaluation and discussion of her lab results.  MEDICAL HISTORY: Past Medical History  Diagnosis Date  . Hypertension   . Hypothyroidism   . Hypercholesterolemia   . Glaucoma   . Hip pain 09/02/2011  . Foot pain 09/02/2011  . Lung cancer ([facility]) 03/08/2011    recurrent    ALLERGIES:  is allergic to cymbalta; fish allergy; amitriptyline; codeine; and sulfa antibiotics.  MEDICATIONS:  Current Outpatient Prescriptions  Medication Sig Dispense Refill  . amLODipine (NORVASC) 5 MG tablet Take 10 mg by mouth daily. Per [name], NP    . aspirin 81 MG tablet Take 81 mg by mouth daily.    . clonazePAM (KLONOPIN) 0.5 MG tablet Take 0.5 mg by mouth 2 (two) times daily as needed. Per [name], NP    . cyanocobalamin (,VITAMIN B-12,) 1000 MCG/ML injection Inject 1,000 mcg into the muscle every 30 (thirty) days.    . diphenoxylate-atropine (LOMOTIL) 2.5-0.025 MG per tablet Take 1 to 2 tablets by mouth every 6 hours as needed for diarrhea 30 tablet 1  . dorzolamide-timolol (COSOPT) 22.3-6.8 MG/ML ophthalmic solution Place 2 drops into both eyes 2 (two) times daily. Per  [name], NP    . erlotinib (TARCEVA) 100 MG tablet Take 1 tablet (100 mg total) by mouth daily. Take on an empty stomach 1 hour before meals or 2 hours after 30 tablet 0  . fluticasone (FLONASE) 50 MCG/ACT nasal spray Place 1 spray into both nostrils daily as needed for allergies or  rhinitis.    Marland Kitchen levothyroxine (SYNTHROID, LEVOTHROID) 88 MCG tablet Take 88 mcg by mouth daily before breakfast.    . lidocaine-prilocaine (EMLA) cream Apply 1 application topically as needed (prior to port access).     Marland Kitchen lisinopril (PRINIVIL,ZESTRIL) 40 MG tablet Take 40 mg by mouth daily. Per [name], NP     . loperamide (IMODIUM) 2 MG capsule Take 1 capsule (2 mg total) by mouth every 4 (four) hours as needed for diarrhea or loose stools. 30 capsule 0  . naproxen (NAPROSYN) 375 MG tablet Take 1 tablet (375 mg total) by mouth 2 (two) times daily. 20 tablet 0  . nitrofurantoin, macrocrystal-monohydrate, (MACROBID) 100 MG capsule Take 100 mg by mouth 2 (two) times daily.    . ondansetron (ZOFRAN) 4 MG tablet Take 1 tablet (4 mg total) by mouth every 8 (eight) hours as needed for nausea or vomiting. 20 tablet 0  . prochlorperazine (COMPAZINE) 10 MG tablet Take 10 mg by mouth every 6 (six) hours as needed (for nausea).      No current facility-administered medications for this visit.    SURGICAL HISTORY:  Past Surgical History  Procedure Laterality Date  . Video assisted thoracoscopy  09/28/2009  . Thoracotomy  09/28/2009    mini  . Lung lobectomy  09/28/2009    right upper    REVIEW OF SYSTEMS:  Constitutional: positive for fatigue Eyes: negative Ears, nose, mouth, throat, and face: negative Respiratory: negative Cardiovascular: negative Gastrointestinal: Intermittent diarrhea Genitourinary: Increased urine frequency Integument/breast: Acne-like skin rash on the face especially the nose area. Hematologic/lymphatic: negative Musculoskeletal:negative Neurological: positive for paresthesia Behavioral/Psych: negative Endocrine: negative Allergic/Immunologic: negative   PHYSICAL EXAMINATION: General appearance: alert, cooperative, fatigued and no distress Head: Normocephalic, without obvious abnormality, atraumatic Neck: no adenopathy, no JVD, supple, symmetrical, trachea  midline and thyroid not enlarged, symmetric, no tenderness/mass/nodules Lymph nodes: Cervical, supraclavicular, and axillary nodes normal. Resp: clear to auscultation bilaterally Back: symmetric, no curvature. ROM normal. No CVA tenderness. Cardio: regular rate and rhythm, S1, S2 normal, no murmur, click, rub or gallop GI: soft, non-tender; bowel sounds normal; no masses,  no organomegaly Extremities: extremities normal, atraumatic, no cyanosis or edema Neurologic: Alert and oriented X 3, normal strength and tone. Normal symmetric reflexes. Normal coordination and gait  ECOG PERFORMANCE STATUS: 1 - Symptomatic but completely ambulatory  Blood pressure 118/55, pulse 69, temperature 97.7 F (36.5 C), temperature source Oral, resp. rate 18, height '5\' 5"'$  (1.651 m), weight 129 lb (58.514 kg), SpO2 96 %.  LABORATORY DATA: Lab Results  Component Value Date   WBC 11.0* 07/17/2015   HGB 12.0 07/17/2015   HCT 36.3 07/17/2015   MCV 90.3 07/17/2015   PLT 208 07/17/2015      Chemistry      Component Value Date/Time   NA 140 07/17/2015 1110   NA 141 01/11/2015 1603   NA 142 03/08/2011 0754   K 3.6 07/17/2015 1110   K 3.6 01/11/2015 1603   K 4.2 03/08/2011 0754   CL 105 01/11/2015 1603   CL 107 03/30/2013 1036   CL 102 03/08/2011 0754   CO2 26 07/17/2015 1110   CO2  25 05/25/2014 0332   CO2 27 03/08/2011 0754   BUN 11.2 07/17/2015 1110   BUN 10 01/11/2015 1603   BUN 9 03/08/2011 0754   CREATININE 1.2* 07/17/2015 1110   CREATININE 1.20* 01/11/2015 1603   CREATININE 1.0 03/08/2011 0754      Component Value Date/Time   CALCIUM 9.8 07/17/2015 1110   CALCIUM 9.2 05/25/2014 0332   CALCIUM 9.5 03/08/2011 0754   ALKPHOS 63 07/17/2015 1110   ALKPHOS 67 05/25/2014 0332   ALKPHOS 59 03/08/2011 0754   AST 14 07/17/2015 1110   AST 17 05/25/2014 0332   AST 19 03/08/2011 0754   ALT 14 07/17/2015 1110   ALT 9 05/25/2014 0332   ALT 18 03/08/2011 0754   BILITOT 0.84 07/17/2015 1110    BILITOT 0.4 05/25/2014 0332   BILITOT 0.50 03/08/2011 0754     RADIOLOGY RESULTS: No results found.  ASSESSMENT AND PLAN: This is a very pleasant 74 years old [place] American female with recurrent non-small cell lung cancer most recently treated with maintenance Avastin status post 32 cycles. She is currently on treatment with single agent Tarceva 150 mg by mouth daily status post 10 months and tolerating her treatment fairly well except for mild skin rash and occasional diarrhea. She is tolerating her current treatment with Tarceva 100 milligrams by mouth daily fairly well. She status post 13 months of treatment. I recommended for her to continue Tarceva with the same dose. I will see her back for follow-up visit in one month's for reevaluation after repeating CT scan of the chest for restaging of her disease. For diarrhea, she will continue on Imodium when necessary. For the skin rash, she will continue to apply the clindamycin 1% lotion to the skin rash area twice a day. For the overactive bladder and urinary tract infection, the patient will continue her follow-up visit with Dr. Risa Grill. The patient was advised to call immediately if she has any concerning symptoms in the interval. The patient voices understanding of current disease status and treatment options and is in agreement with the current care plan.  All questions were answered. The patient knows to call the clinic with any problems, questions or concerns. We can certainly see the patient much sooner if necessary.  Disclaimer: This note was dictated with voice recognition software. Similar sounding words can inadvertently be transcribed and may not be corrected upon review.

## 2015-07-17 NOTE — Telephone Encounter (Signed)
Pt confirmed labs/ov per 10/03 POF, gave pt AVS and Calendar... KJ °

## 2015-08-10 DIAGNOSIS — N3020 Other chronic cystitis without hematuria: Secondary | ICD-10-CM | POA: Diagnosis not present

## 2015-08-10 DIAGNOSIS — R351 Nocturia: Secondary | ICD-10-CM | POA: Diagnosis not present

## 2015-08-10 DIAGNOSIS — R350 Frequency of micturition: Secondary | ICD-10-CM | POA: Diagnosis not present

## 2015-08-14 ENCOUNTER — Ambulatory Visit (HOSPITAL_COMMUNITY)
Admission: RE | Admit: 2015-08-14 | Discharge: 2015-08-14 | Disposition: A | Discharge status: Home / Self Care | Payer: Medicare Other | Source: Ambulatory Visit | Attending: Internal Medicine | Admitting: Internal Medicine

## 2015-08-14 ENCOUNTER — Encounter (HOSPITAL_COMMUNITY): Payer: Self-pay

## 2015-08-14 ENCOUNTER — Other Ambulatory Visit (HOSPITAL_BASED_OUTPATIENT_CLINIC_OR_DEPARTMENT_OTHER): Payer: Medicare Other

## 2015-08-14 ENCOUNTER — Ambulatory Visit (HOSPITAL_BASED_OUTPATIENT_CLINIC_OR_DEPARTMENT_OTHER): Payer: Medicare Other

## 2015-08-14 DIAGNOSIS — J432 Centrilobular emphysema: Secondary | ICD-10-CM | POA: Diagnosis not present

## 2015-08-14 DIAGNOSIS — E538 Deficiency of other specified B group vitamins: Secondary | ICD-10-CM

## 2015-08-14 DIAGNOSIS — I2510 Atherosclerotic heart disease of native coronary artery without angina pectoris: Secondary | ICD-10-CM | POA: Insufficient documentation

## 2015-08-14 DIAGNOSIS — R918 Other nonspecific abnormal finding of lung field: Secondary | ICD-10-CM | POA: Insufficient documentation

## 2015-08-14 DIAGNOSIS — C3411 Malignant neoplasm of upper lobe, right bronchus or lung: Secondary | ICD-10-CM

## 2015-08-14 DIAGNOSIS — Z85850 Personal history of malignant neoplasm of thyroid: Secondary | ICD-10-CM | POA: Diagnosis not present

## 2015-08-14 DIAGNOSIS — C3490 Malignant neoplasm of unspecified part of unspecified bronchus or lung: Secondary | ICD-10-CM

## 2015-08-14 DIAGNOSIS — C3491 Malignant neoplasm of unspecified part of right bronchus or lung: Secondary | ICD-10-CM | POA: Insufficient documentation

## 2015-08-14 DIAGNOSIS — Z9221 Personal history of antineoplastic chemotherapy: Secondary | ICD-10-CM | POA: Insufficient documentation

## 2015-08-14 DIAGNOSIS — K449 Diaphragmatic hernia without obstruction or gangrene: Secondary | ICD-10-CM | POA: Diagnosis not present

## 2015-08-14 DIAGNOSIS — R0602 Shortness of breath: Secondary | ICD-10-CM | POA: Insufficient documentation

## 2015-08-14 DIAGNOSIS — R200 Anesthesia of skin: Secondary | ICD-10-CM

## 2015-08-14 LAB — COMPREHENSIVE METABOLIC PANEL (CC13)
ALT: 14 U/L (ref 0–55)
ANION GAP: 6 meq/L (ref 3–11)
AST: 12 U/L (ref 5–34)
Albumin: 3.9 g/dL (ref 3.5–5.0)
Alkaline Phosphatase: 75 U/L (ref 40–150)
BUN: 15.4 mg/dL (ref 7.0–26.0)
CALCIUM: 10.1 mg/dL (ref 8.4–10.4)
CHLORIDE: 109 meq/L (ref 98–109)
CO2: 24 mEq/L (ref 22–29)
CREATININE: 1.3 mg/dL — AB (ref 0.6–1.1)
EGFR: 48 mL/min/{1.73_m2} — AB (ref >90)
Glucose: 95 mg/dl (ref 70–140)
POTASSIUM: 4.4 meq/L (ref 3.5–5.1)
Sodium: 139 mEq/L (ref 136–145)
Total Bilirubin: 0.68 mg/dL (ref 0.20–1.20)
Total Protein: 7 g/dL (ref 6.4–8.3)

## 2015-08-14 LAB — CBC WITH DIFFERENTIAL/PLATELET
BASO%: 1.2 % (ref 0.0–2.0)
Basophils Absolute: 0.1 10*3/uL (ref 0.0–0.1)
EOS ABS: 0.5 10*3/uL (ref 0.0–0.5)
EOS%: 6.6 % (ref 0.0–7.0)
HEMATOCRIT: 35.5 % (ref 34.8–46.6)
HEMOGLOBIN: 11.5 g/dL — AB (ref 11.6–15.9)
LYMPH#: 1.7 10*3/uL (ref 0.9–3.3)
LYMPH%: 22.2 % (ref 14.0–49.7)
MCH: 29 pg (ref 25.1–34.0)
MCHC: 32.4 g/dL (ref 31.5–36.0)
MCV: 89.5 fL (ref 79.5–101.0)
MONO#: 0.6 10*3/uL (ref 0.1–0.9)
MONO%: 7.6 % (ref 0.0–14.0)
NEUT%: 62.4 % (ref 38.4–76.8)
NEUTROS ABS: 4.9 10*3/uL (ref 1.5–6.5)
PLATELETS: 231 10*3/uL (ref 145–400)
RBC: 3.96 10*6/uL (ref 3.70–5.45)
RDW: 14.6 % — AB (ref 11.2–14.5)
WBC: 7.9 10*3/uL (ref 3.9–10.3)

## 2015-08-14 MED ORDER — SODIUM CHLORIDE 0.9 % IJ SOLN
10.0000 mL | INTRAMUSCULAR | Status: DC | PRN
  Administered 2015-08-14: 10 mL via INTRAVENOUS
  Filled 2015-08-14: qty 10

## 2015-08-14 MED ORDER — IOHEXOL 300 MG/ML  SOLN
75.0000 mL | Freq: Once | INTRAMUSCULAR | Status: AC | PRN
  Administered 2015-08-14: 75 mL via INTRAVENOUS

## 2015-08-14 MED ORDER — HEPARIN SOD (PORK) LOCK FLUSH 100 UNIT/ML IV SOLN
500.0000 [IU] | Freq: Once | INTRAVENOUS | Status: AC
  Administered 2015-08-14: 500 [IU] via INTRAVENOUS
  Filled 2015-08-14: qty 5

## 2015-08-14 NOTE — Patient Instructions (Signed)

## 2015-08-22 ENCOUNTER — Telehealth: Payer: Self-pay | Admitting: Internal Medicine

## 2015-08-22 ENCOUNTER — Encounter: Payer: Self-pay | Admitting: Internal Medicine

## 2015-08-22 ENCOUNTER — Ambulatory Visit (HOSPITAL_BASED_OUTPATIENT_CLINIC_OR_DEPARTMENT_OTHER): Payer: Medicare Other | Admitting: Internal Medicine

## 2015-08-22 ENCOUNTER — Ambulatory Visit (HOSPITAL_BASED_OUTPATIENT_CLINIC_OR_DEPARTMENT_OTHER): Payer: Medicare Other

## 2015-08-22 VITALS — BP 125/88 | HR 69 | Temp 97.7°F | Resp 18 | Ht 65.0 in | Wt 129.4 lb

## 2015-08-22 DIAGNOSIS — R197 Diarrhea, unspecified: Secondary | ICD-10-CM | POA: Diagnosis not present

## 2015-08-22 DIAGNOSIS — C3410 Malignant neoplasm of upper lobe, unspecified bronchus or lung: Secondary | ICD-10-CM

## 2015-08-22 DIAGNOSIS — C3411 Malignant neoplasm of upper lobe, right bronchus or lung: Secondary | ICD-10-CM | POA: Diagnosis not present

## 2015-08-22 DIAGNOSIS — E538 Deficiency of other specified B group vitamins: Secondary | ICD-10-CM

## 2015-08-22 DIAGNOSIS — R200 Anesthesia of skin: Secondary | ICD-10-CM

## 2015-08-22 DIAGNOSIS — R21 Rash and other nonspecific skin eruption: Secondary | ICD-10-CM

## 2015-08-22 MED ORDER — CYANOCOBALAMIN 1000 MCG/ML IJ SOLN
1000.0000 ug | Freq: Once | INTRAMUSCULAR | Status: AC
  Administered 2015-08-22: 1000 ug via INTRAMUSCULAR

## 2015-08-22 NOTE — Progress Notes (Signed)
[facility] [phone]) [phone]   Fax:(336) [phone]  OFFICE PROGRESS NOTE  [name], [name] [phone] [place] [address]  DIAGNOSIS AND STAGE:  #1 Recurrent non-small cell lung cancer consistent with adenocarcinoma. This was initially diagnosed as stage IB (T2a N0 M0) well-differentiated adenocarcinoma of bronchioalveolar type in December 2010.  #2 Acute pulmonary embolism in the right lower lobe lobar, segmental and subsegmental sized pulmonary arteries, diagnosed on 11/04/2011.   PRIOR THERAPY:  1.Status post right upper lobectomy on September 28, 2009 under the care of Dr. Arlyce Dice. The patient refused adjuvant chemotherapy at that time. She had disease recurrence in June of 2012.  2.Status post 4 cycles of systemic chemotherapy with carboplatin for an AUC of 6 and paclitaxel at 200 mg per meter squared and Avastin at 15 mg/kg according to the ECOG protocol #5508.  3. Chemotherapy with Avastin 15 mg/kg according to the ECOG protocol #5508 status post 32 cycles. Last cycle was given on 06/30/2013 discontinued secondary to disease progression and persistent proteinuria. 4. Tarceva 150 mg by mouth daily, started 08/09/2013, status post 10 months of treatment.  CURRENT THERAPY: Tarceva 100 mg by mouth daily started 07/02/2014, status post 14 months.  CHEMOTHERAPY INTENT: Palliative.  CURRENT # OF CHEMOTHERAPY CYCLES: 15 CURRENT ANTIEMETICS: Compazine  CURRENT SMOKING STATUS: Non-smoker  ORAL CHEMOTHERAPY AND CONSENT: Tarceva and consent was signed 07/21/2013.  CURRENT BISPHOSPHONATES USE: None  PAIN MANAGEMENT: No pain  NARCOTICS INDUCED CONSTIPATION: No constipation.  LIVING WILL AND CODE STATUS: Full code   INTERVAL HISTORY: [name] 74 y.o. female returns to the clinic today for routine monthly followup visit. The patient has no significant complaints today. She was treated again for urinary tract infection by Dr. Risa Grill with Cipro and  trimethoprim. The patient is feeling fine today except for mild skin rash mainly on the face. She is tolerating her current dose of Tarceva 100 mg by mouth daily fairly well. She denied having any significant nausea or vomiting. She denied having any chest pain but continues to have shortness of breath with exertion with no hemoptysis. The patient denied having any fever or chills. She had repeat CT scan of the chest performed recently and she is here for evaluation and discussion of her scan results.  MEDICAL HISTORY: Past Medical History  Diagnosis Date  . Hypertension   . Hypothyroidism   . Hypercholesterolemia   . Glaucoma   . Hip pain 09/02/2011  . Foot pain 09/02/2011  . Lung cancer ([facility]) 03/08/2011    recurrent    ALLERGIES:  is allergic to cymbalta; fish allergy; amitriptyline; codeine; and sulfa antibiotics.  MEDICATIONS:  Current Outpatient Prescriptions  Medication Sig Dispense Refill  . amLODipine (NORVASC) 5 MG tablet Take 10 mg by mouth daily. Per [name], [name]    . aspirin 81 MG tablet Take 81 mg by mouth daily.    . ciprofloxacin (CIPRO) 500 MG tablet TAKE 1 TABLET EVERY TWELVE HOURS  0  . clonazePAM (KLONOPIN) 0.5 MG tablet Take 0.5 mg by mouth 2 (two) times daily as needed. Per [name], [name]    . cyanocobalamin (,VITAMIN B-12,) 1000 MCG/ML injection Inject 1,000 mcg into the muscle every 30 (thirty) days.    . diphenoxylate-atropine (LOMOTIL) 2.5-0.025 MG per tablet Take 1 to 2 tablets by mouth every 6 hours as needed for diarrhea 30 tablet 1  . dorzolamide-timolol (COSOPT) 22.3-6.8 MG/ML ophthalmic solution Place 2 drops into both eyes 2 (two)  times daily. Per [name], [name]    . erlotinib (TARCEVA) 100 MG tablet Take 1 tablet (100 mg total) by mouth daily. Take on an empty stomach 1 hour before meals or 2 hours after 30 tablet 0  . fluticasone (FLONASE) 50 MCG/ACT nasal spray Place 1 spray into both nostrils daily as needed for allergies or rhinitis.    Marland Kitchen  levothyroxine (SYNTHROID, LEVOTHROID) 88 MCG tablet Take 88 mcg by mouth daily before breakfast.    . lidocaine-prilocaine (EMLA) cream Apply 1 application topically as needed (prior to port access).     Marland Kitchen lisinopril (PRINIVIL,ZESTRIL) 40 MG tablet Take 40 mg by mouth daily. Per [name], [name]     . loperamide (IMODIUM) 2 MG capsule Take 1 capsule (2 mg total) by mouth every 4 (four) hours as needed for diarrhea or loose stools. 30 capsule 0  . naproxen (NAPROSYN) 375 MG tablet Take 1 tablet (375 mg total) by mouth 2 (two) times daily. 20 tablet 0  . nitrofurantoin, macrocrystal-monohydrate, (MACROBID) 100 MG capsule Take 100 mg by mouth 2 (two) times daily.    . ondansetron (ZOFRAN) 4 MG tablet Take 1 tablet (4 mg total) by mouth every 8 (eight) hours as needed for nausea or vomiting. 20 tablet 0  . prochlorperazine (COMPAZINE) 10 MG tablet Take 10 mg by mouth every 6 (six) hours as needed (for nausea).     . trimethoprim (TRIMPEX) 100 MG tablet TAKE 1 TABLET DAILY AT BEDTIME FOR PREVENTION OF UTI. START AFTER COMPLETING CIPRO  2   No current facility-administered medications for this visit.    SURGICAL HISTORY:  Past Surgical History  Procedure Laterality Date  . Video assisted thoracoscopy  09/28/2009  . Thoracotomy  09/28/2009    mini  . Lung lobectomy  09/28/2009    right upper    REVIEW OF SYSTEMS:  Constitutional: positive for fatigue Eyes: negative Ears, nose, mouth, throat, and face: negative Respiratory: negative Cardiovascular: negative Gastrointestinal: Negative Genitourinary: Increased urine frequency Integument/breast: Acne-like skin rash on the face especially the nose area. Hematologic/lymphatic: negative Musculoskeletal:negative Neurological: positive for paresthesia Behavioral/Psych: negative Endocrine: negative Allergic/Immunologic: negative   PHYSICAL EXAMINATION: General appearance: alert, cooperative, fatigued and no distress Head: Normocephalic,  without obvious abnormality, atraumatic Neck: no adenopathy, no JVD, supple, symmetrical, trachea midline and thyroid not enlarged, symmetric, no tenderness/mass/nodules Lymph nodes: Cervical, supraclavicular, and axillary nodes normal. Resp: clear to auscultation bilaterally Back: symmetric, no curvature. ROM normal. No CVA tenderness. Cardio: regular rate and rhythm, S1, S2 normal, no murmur, click, rub or gallop GI: soft, non-tender; bowel sounds normal; no masses,  no organomegaly Extremities: extremities normal, atraumatic, no cyanosis or edema Neurologic: Alert and oriented X 3, normal strength and tone. Normal symmetric reflexes. Normal coordination and gait  ECOG PERFORMANCE STATUS: 1 - Symptomatic but completely ambulatory  Blood pressure 125/88, pulse 69, temperature 97.7 F (36.5 C), temperature source Oral, resp. rate 18, height '5\' 5"'$  (1.651 m), weight 129 lb 6.4 oz (58.695 kg), SpO2 99 %.  LABORATORY DATA: Lab Results  Component Value Date   WBC 7.9 08/14/2015   HGB 11.5* 08/14/2015   HCT 35.5 08/14/2015   MCV 89.5 08/14/2015   PLT 231 08/14/2015      Chemistry      Component Value Date/Time   NA 139 08/14/2015 0958   NA 141 01/11/2015 1603   NA 142 03/08/2011 0754   K 4.4 08/14/2015 0958   K 3.6 01/11/2015 1603   K 4.2 03/08/2011 0754  CL 105 01/11/2015 1603   CL 107 03/30/2013 1036   CL 102 03/08/2011 0754   CO2 24 08/14/2015 0958   CO2 25 05/25/2014 0332   CO2 27 03/08/2011 0754   BUN 15.4 08/14/2015 0958   BUN 10 01/11/2015 1603   BUN 9 03/08/2011 0754   CREATININE 1.3* 08/14/2015 0958   CREATININE 1.20* 01/11/2015 1603   CREATININE 1.0 03/08/2011 0754      Component Value Date/Time   CALCIUM 10.1 08/14/2015 0958   CALCIUM 9.2 05/25/2014 0332   CALCIUM 9.5 03/08/2011 0754   ALKPHOS 75 08/14/2015 0958   ALKPHOS 67 05/25/2014 0332   ALKPHOS 59 03/08/2011 0754   AST 12 08/14/2015 0958   AST 17 05/25/2014 0332   AST 19 03/08/2011 0754   ALT 14  08/14/2015 0958   ALT 9 05/25/2014 0332   ALT 18 03/08/2011 0754   BILITOT 0.68 08/14/2015 0958   BILITOT 0.4 05/25/2014 0332   BILITOT 0.50 03/08/2011 0754     RADIOLOGY RESULTS: Ct Chest W Contrast  08/14/2015  CLINICAL DATA:  Right lung cancer, partial right upper lobectomy, previous chemotherapy, ongoing Tarceva. History of thyroid cancer. Shortness of breath on exertion. EXAM: CT CHEST WITH CONTRAST TECHNIQUE: Multidetector CT imaging of the chest was performed during intravenous contrast administration. CONTRAST:  80m OMNIPAQUE IOHEXOL 300 MG/ML  SOLN COMPARISON:  05/09/2015 and 07/20/2013. FINDINGS: Mediastinum/Nodes: Right IJ Port-A-Cath terminates at the SVC RA junction. No pathologically enlarged mediastinal, hilar or axillary lymph nodes. Atherosclerotic calcification of the arterial vasculature, including three-vessel involvement of the coronary arteries. Heart size normal. No pericardial effusion. Lungs/Pleura: Moderate centrilobular emphysema. Right upper lobectomy with scarring in the apex of the right hemi thorax. Scarring is also seen in the apex of the left upper lobe. A predominantly ground-glass nodule in the right lower lobe measures 1.9 x 2.1 cm (series 5, image 24) and may have early solid components medially (image 23). While stable from 05/09/2015, lesion appears new from 07/20/2013. A mixed solid and ground-glass nodule in the medial right lower lobe has an overall measurement of 1.7 x 3.3 cm (image 32) with a solid component measuring 0.9 x 1.8 cm. On 07/20/2013, overall lesion measures 1.5 x 3.1 cm with no measurable solid component at that time. Finally, the largest lesion is seen in the left lower lobe, is mixed solid and ground-glass, and has an overall measurement of 5.3 x 5.8 cm (series 5, image 27) with the solid component measuring 2.1 x 2.7 cm (image 27). When compared with 07/20/2013, overall lesion measurement is 4.1 x 5.3 cm with a solid component measuring roughly  0.9 x 2.3 cm. Multiple additional scattered ill-defined areas of ground-glass and mild architectural distortion are seen bilaterally. Calcifications in the left upper lobe. No pleural fluid. Airway is otherwise unremarkable. Upper abdomen: A 6 mm low-attenuation lesion in the dome of the liver is too small to characterize, stable from 05/09/2015. Visualized portions of the liver, gallbladder, adrenal glands, kidneys, spleen, pancreas, stomach and bowel are otherwise grossly unremarkable with exception of a tiny hiatal hernia. No upper abdominal adenopathy. Musculoskeletal: No worrisome lytic or sclerotic lesions. IMPRESSION: 1. Slowly progressive mixed solid and ground-glass lesions in the lower lobes bilaterally, as detailed above, most consistent with multifocal adenocarcinoma. 2. Three-vessel coronary artery calcification. Electronically Signed   By: MLorin PicketM.D.   On: 08/14/2015 12:52    ASSESSMENT AND PLAN: This is a very pleasant 74years old [place] female with recurrent non-small cell  lung cancer most recently treated with maintenance Avastin status post 32 cycles. She is currently on treatment with single agent Tarceva 150 mg by mouth daily status post 10 months and tolerating her treatment fairly well except for mild skin rash and occasional diarrhea. She is tolerating her current treatment with Tarceva 100 milligrams by mouth daily fairly well. She status post 14 months of treatment. The recent CT scan of the chest showed still progressive mixed solid and groundglass lesions in the lower lobes bilaterally. I discussed the scan results with the patient today. I recommended for her to continue Tarceva at the same dose 100 mg by mouth daily for now. If she continues to have evidence for disease progression on the upcoming scan, I may consider the patient for other treatment options including immunotherapy or chemotherapy. I recommended for her to continue Tarceva with the same  dose. The patient agreed to the current plan. For diarrhea, she will continue on Imodium when necessary. For the skin rash, she will continue to apply the clindamycin 1% lotion to the skin rash area twice a day. For the overactive bladder and urinary tract infection, the patient will continue her follow-up visit with Dr. Risa Grill. The patient was advised to call immediately if she has any concerning symptoms in the interval. The patient voices understanding of current disease status and treatment options and is in agreement with the current care plan.  All questions were answered. The patient knows to call the clinic with any problems, questions or concerns. We can certainly see the patient much sooner if necessary.  Disclaimer: This note was dictated with voice recognition software. Similar sounding words can inadvertently be transcribed and may not be corrected upon review.

## 2015-08-22 NOTE — Telephone Encounter (Signed)
Gave and printed appts ched and avs for pt for DEC  °

## 2015-08-30 DIAGNOSIS — E039 Hypothyroidism, unspecified: Secondary | ICD-10-CM | POA: Diagnosis not present

## 2015-08-30 DIAGNOSIS — I10 Essential (primary) hypertension: Secondary | ICD-10-CM | POA: Diagnosis not present

## 2015-08-30 DIAGNOSIS — R7303 Prediabetes: Secondary | ICD-10-CM | POA: Diagnosis not present

## 2015-08-30 DIAGNOSIS — I6529 Occlusion and stenosis of unspecified carotid artery: Secondary | ICD-10-CM | POA: Diagnosis not present

## 2015-08-30 DIAGNOSIS — Z23 Encounter for immunization: Secondary | ICD-10-CM | POA: Diagnosis not present

## 2015-08-30 DIAGNOSIS — K521 Toxic gastroenteritis and colitis: Secondary | ICD-10-CM | POA: Diagnosis not present

## 2015-08-30 DIAGNOSIS — Z7982 Long term (current) use of aspirin: Secondary | ICD-10-CM | POA: Diagnosis not present

## 2015-08-30 DIAGNOSIS — E559 Vitamin D deficiency, unspecified: Secondary | ICD-10-CM | POA: Diagnosis not present

## 2015-08-30 DIAGNOSIS — M8580 Other specified disorders of bone density and structure, unspecified site: Secondary | ICD-10-CM | POA: Diagnosis not present

## 2015-08-30 DIAGNOSIS — R7309 Other abnormal glucose: Secondary | ICD-10-CM | POA: Diagnosis not present

## 2015-09-06 DIAGNOSIS — N3020 Other chronic cystitis without hematuria: Secondary | ICD-10-CM | POA: Diagnosis not present

## 2015-09-13 ENCOUNTER — Other Ambulatory Visit: Payer: Self-pay | Admitting: Internal Medicine

## 2015-09-19 ENCOUNTER — Ambulatory Visit (HOSPITAL_BASED_OUTPATIENT_CLINIC_OR_DEPARTMENT_OTHER): Payer: Medicare Other | Admitting: Internal Medicine

## 2015-09-19 ENCOUNTER — Other Ambulatory Visit (HOSPITAL_BASED_OUTPATIENT_CLINIC_OR_DEPARTMENT_OTHER): Payer: Medicare Other

## 2015-09-19 ENCOUNTER — Encounter: Payer: Self-pay | Admitting: Internal Medicine

## 2015-09-19 ENCOUNTER — Ambulatory Visit (HOSPITAL_BASED_OUTPATIENT_CLINIC_OR_DEPARTMENT_OTHER): Payer: Medicare Other

## 2015-09-19 ENCOUNTER — Telehealth: Payer: Self-pay | Admitting: Internal Medicine

## 2015-09-19 VITALS — BP 130/53 | HR 81 | Temp 97.9°F | Resp 18 | Ht 65.0 in | Wt 126.5 lb

## 2015-09-19 DIAGNOSIS — C3411 Malignant neoplasm of upper lobe, right bronchus or lung: Secondary | ICD-10-CM

## 2015-09-19 DIAGNOSIS — R21 Rash and other nonspecific skin eruption: Secondary | ICD-10-CM | POA: Diagnosis not present

## 2015-09-19 DIAGNOSIS — R200 Anesthesia of skin: Secondary | ICD-10-CM

## 2015-09-19 DIAGNOSIS — C3410 Malignant neoplasm of upper lobe, unspecified bronchus or lung: Secondary | ICD-10-CM

## 2015-09-19 DIAGNOSIS — C3491 Malignant neoplasm of unspecified part of right bronchus or lung: Secondary | ICD-10-CM

## 2015-09-19 DIAGNOSIS — C3492 Malignant neoplasm of unspecified part of left bronchus or lung: Principal | ICD-10-CM

## 2015-09-19 DIAGNOSIS — E538 Deficiency of other specified B group vitamins: Secondary | ICD-10-CM

## 2015-09-19 LAB — COMPREHENSIVE METABOLIC PANEL
ALBUMIN: 4 g/dL (ref 3.5–5.0)
ALK PHOS: 70 U/L (ref 40–150)
ALT: 11 U/L (ref 0–55)
ANION GAP: 9 meq/L (ref 3–11)
AST: 13 U/L (ref 5–34)
BILIRUBIN TOTAL: 0.71 mg/dL (ref 0.20–1.20)
BUN: 17.5 mg/dL (ref 7.0–26.0)
CO2: 24 mEq/L (ref 22–29)
Calcium: 10.2 mg/dL (ref 8.4–10.4)
Chloride: 106 mEq/L (ref 98–109)
Creatinine: 1.6 mg/dL — ABNORMAL HIGH (ref 0.6–1.1)
EGFR: 35 mL/min/{1.73_m2} — AB (ref >90)
Glucose: 81 mg/dl (ref 70–140)
Potassium: 4.4 mEq/L (ref 3.5–5.1)
Sodium: 138 mEq/L (ref 136–145)
TOTAL PROTEIN: 7.3 g/dL (ref 6.4–8.3)

## 2015-09-19 LAB — CBC WITH DIFFERENTIAL/PLATELET
BASO%: 0.6 % (ref 0.0–2.0)
Basophils Absolute: 0 10*3/uL (ref 0.0–0.1)
EOS ABS: 0.3 10*3/uL (ref 0.0–0.5)
EOS%: 3.6 % (ref 0.0–7.0)
HCT: 36.2 % (ref 34.8–46.6)
HEMOGLOBIN: 11.8 g/dL (ref 11.6–15.9)
LYMPH%: 25 % (ref 14.0–49.7)
MCH: 29 pg (ref 25.1–34.0)
MCHC: 32.6 g/dL (ref 31.5–36.0)
MCV: 89.2 fL (ref 79.5–101.0)
MONO#: 0.6 10*3/uL (ref 0.1–0.9)
MONO%: 7.2 % (ref 0.0–14.0)
NEUT%: 63.6 % (ref 38.4–76.8)
NEUTROS ABS: 4.9 10*3/uL (ref 1.5–6.5)
PLATELETS: 208 10*3/uL (ref 145–400)
RBC: 4.06 10*6/uL (ref 3.70–5.45)
RDW: 14.7 % — AB (ref 11.2–14.5)
WBC: 7.7 10*3/uL (ref 3.9–10.3)
lymph#: 1.9 10*3/uL (ref 0.9–3.3)

## 2015-09-19 MED ORDER — CYANOCOBALAMIN 1000 MCG/ML IJ SOLN
1000.0000 ug | Freq: Once | INTRAMUSCULAR | Status: AC
  Administered 2015-09-19: 1000 ug via INTRAMUSCULAR

## 2015-09-19 NOTE — Telephone Encounter (Signed)
Gave and printed appt sched and avs fo rpt for Jan °

## 2015-09-19 NOTE — Progress Notes (Signed)
[facility] [phone]   [phone]  OFFICE PROGRESS NOTE  Terri Levels, [place] [phone] [place] [address]  DIAGNOSIS AND STAGE:  #1 Recurrent non-small cell lung cancer consistent with adenocarcinoma. This was initially diagnosed as stage IB (T2a N0 M0) well-differentiated adenocarcinoma of bronchioalveolar type in December 2010.  #2 Acute pulmonary embolism in the right lower lobe lobar, segmental and subsegmental sized pulmonary arteries, diagnosed on 11/04/2011.   PRIOR THERAPY:  1.Status post right upper lobectomy on September 28, 2009 under the care of Dr. Arlyce Dice. The patient refused adjuvant chemotherapy at that time. She had disease recurrence in June of 2012.  2.Status post 4 cycles of systemic chemotherapy with carboplatin for an AUC of 6 and paclitaxel at 200 mg per meter squared and Avastin at 15 mg/kg according to the ECOG protocol #5508.  3. Chemotherapy with Avastin 15 mg/kg according to the ECOG protocol #5508 status post 32 cycles. Last cycle was given on 06/30/2013 discontinued secondary to disease progression and persistent proteinuria. 4. Tarceva 150 mg by mouth daily, started 08/09/2013, status post 10 months of treatment.  CURRENT THERAPY: Tarceva 100 mg by mouth daily started 07/02/2014, status post 15 months.  CHEMOTHERAPY INTENT: Palliative.  CURRENT # OF CHEMOTHERAPY CYCLES: 16 CURRENT ANTIEMETICS: Compazine  CURRENT SMOKING STATUS: Non-smoker  ORAL CHEMOTHERAPY AND CONSENT: Tarceva and consent was signed 07/21/2013.  CURRENT BISPHOSPHONATES USE: None  PAIN MANAGEMENT: No pain  NARCOTICS INDUCED CONSTIPATION: No constipation.  LIVING WILL AND CODE STATUS: Full code   INTERVAL HISTORY: [name] 74 y.o. female returns to the clinic today for routine monthly followup visit. The patient has no significant complaints today. The patient is feeling fine today with no specific complaints. She is tolerating  her current dose of Tarceva 100 mg by mouth daily fairly well. She denied having any significant nausea or vomiting. She denied having any chest pain but continues to have shortness of breath with exertion with no hemoptysis. The patient denied having any fever or chills.   MEDICAL HISTORY: Past Medical History  Diagnosis Date  . Hypertension   . Hypothyroidism   . Hypercholesterolemia   . Glaucoma   . Hip pain 09/02/2011  . Foot pain 09/02/2011  . Lung cancer ([place]) 03/08/2011    recurrent    ALLERGIES:  is allergic to cymbalta; fish allergy; amitriptyline; codeine; and sulfa antibiotics.  MEDICATIONS:  Current Outpatient Prescriptions  Medication Sig Dispense Refill  . amLODipine (NORVASC) 5 MG tablet Take 10 mg by mouth daily. Per [name], NP    . aspirin 81 MG tablet Take 81 mg by mouth daily.    . ciprofloxacin (CIPRO) 500 MG tablet TAKE 1 TABLET EVERY TWELVE HOURS  0  . clonazePAM (KLONOPIN) 0.5 MG tablet Take 0.5 mg by mouth 2 (two) times daily as needed. Per [name], NP    . cyanocobalamin (,VITAMIN B-12,) 1000 MCG/ML injection Inject 1,000 mcg into the muscle every 30 (thirty) days.    . diphenoxylate-atropine (LOMOTIL) 2.5-0.025 MG per tablet Take 1 to 2 tablets by mouth every 6 hours as needed for diarrhea 30 tablet 1  . dorzolamide-timolol (COSOPT) 22.3-6.8 MG/ML ophthalmic solution Place 2 drops into both eyes 2 (two) times daily. Per [name], NP    . erlotinib (TARCEVA) 100 MG tablet Take 1 tablet (100 mg total) by mouth daily. Take on an empty stomach 1 hour before meals or 2 hours after 30 tablet 0  .  fluticasone (FLONASE) 50 MCG/ACT nasal spray Place 1 spray into both nostrils daily as needed for allergies or rhinitis.    Marland Kitchen levothyroxine (SYNTHROID, LEVOTHROID) 88 MCG tablet Take 88 mcg by mouth daily before breakfast.    . lidocaine-prilocaine (EMLA) cream Apply 1 application topically as needed (prior to port access).     Marland Kitchen lisinopril  (PRINIVIL,ZESTRIL) 40 MG tablet Take 40 mg by mouth daily. Per [name], NP     . loperamide (IMODIUM) 2 MG capsule Take 1 capsule (2 mg total) by mouth every 4 (four) hours as needed for diarrhea or loose stools. 30 capsule 0  . MYRBETRIQ 50 MG TB24 tablet Take 50 mg by mouth daily.  3  . naproxen (NAPROSYN) 375 MG tablet Take 1 tablet (375 mg total) by mouth 2 (two) times daily. 20 tablet 0  . nitrofurantoin, macrocrystal-monohydrate, (MACROBID) 100 MG capsule Take 100 mg by mouth 2 (two) times daily.    . ondansetron (ZOFRAN) 4 MG tablet Take 1 tablet (4 mg total) by mouth every 8 (eight) hours as needed for nausea or vomiting. 20 tablet 0  . prochlorperazine (COMPAZINE) 10 MG tablet Take 10 mg by mouth every 6 (six) hours as needed (for nausea).     . TARCEVA 100 MG tablet TAKE 1 TABLET BY MOUTH DAILY ON AN EMPTY STOMACH 30 tablet 1  . trimethoprim (TRIMPEX) 100 MG tablet TAKE 1 TABLET DAILY AT BEDTIME FOR PREVENTION OF UTI. START AFTER COMPLETING CIPRO  2  . Vitamin D, Ergocalciferol, (DRISDOL) 50000 UNITS CAPS capsule Take 1 capsule by mouth once a week.  0   No current facility-administered medications for this visit.    SURGICAL HISTORY:  Past Surgical History  Procedure Laterality Date  . Video assisted thoracoscopy  09/28/2009  . Thoracotomy  09/28/2009    mini  . Lung lobectomy  09/28/2009    right upper   ROS REVIEW OF SYSTEMS:  Constitutional: positive for fatigue Eyes: negative Ears, nose, mouth, throat, and face: negative Respiratory: negative Cardiovascular: negative Gastrointestinal: Negative Genitourinary: Increased urine frequency Integument/breast: Acne-like skin rash on the face especially the nose area. Hematologic/lymphatic: negative Musculoskeletal:negative Neurological: positive for paresthesia Behavioral/Psych: negative Endocrine: negative Allergic/Immunologic: negative   PHYSICAL EXAMINATION: General appearance: alert, cooperative, fatigued and  no distress Head: Normocephalic, without obvious abnormality, atraumatic Neck: no adenopathy, no JVD, supple, symmetrical, trachea midline and thyroid not enlarged, symmetric, no tenderness/mass/nodules Lymph nodes: Cervical, supraclavicular, and axillary nodes normal. Resp: clear to auscultation bilaterally Back: symmetric, no curvature. ROM normal. No CVA tenderness. Cardio: regular rate and rhythm, S1, S2 normal, no murmur, click, rub or gallop GI: soft, non-tender; bowel sounds normal; no masses,  no organomegaly Extremities: extremities normal, atraumatic, no cyanosis or edema Neurologic: Alert and oriented X 3, normal strength and tone. Normal symmetric reflexes. Normal coordination and gait  ECOG PERFORMANCE STATUS: 1 - Symptomatic but completely ambulatory  Blood pressure 130/53, pulse 81, temperature 97.9 F (36.6 C), temperature source Oral, resp. rate 18, height '5\' 5"'$  (1.651 m), weight 126 lb 8 oz (57.38 kg), SpO2 100 %.  LABORATORY DATA: Lab Results  Component Value Date   WBC 7.7 09/19/2015   HGB 11.8 09/19/2015   HCT 36.2 09/19/2015   MCV 89.2 09/19/2015   PLT 208 09/19/2015      Chemistry      Component Value Date/Time   NA 138 09/19/2015 1129   NA 141 01/11/2015 1603   NA 142 03/08/2011 0754   K 4.4 09/19/2015 1129  K 3.6 01/11/2015 1603   K 4.2 03/08/2011 0754   CL 105 01/11/2015 1603   CL 107 03/30/2013 1036   CL 102 03/08/2011 0754   CO2 24 09/19/2015 1129   CO2 25 05/25/2014 0332   CO2 27 03/08/2011 0754   BUN 17.5 09/19/2015 1129   BUN 10 01/11/2015 1603   BUN 9 03/08/2011 0754   CREATININE 1.6* 09/19/2015 1129   CREATININE 1.20* 01/11/2015 1603   CREATININE 1.0 03/08/2011 0754      Component Value Date/Time   CALCIUM 10.2 09/19/2015 1129   CALCIUM 9.2 05/25/2014 0332   CALCIUM 9.5 03/08/2011 0754   ALKPHOS 70 09/19/2015 1129   ALKPHOS 67 05/25/2014 0332   ALKPHOS 59 03/08/2011 0754   AST 13 09/19/2015 1129   AST 17 05/25/2014 0332   AST  19 03/08/2011 0754   ALT 11 09/19/2015 1129   ALT 9 05/25/2014 0332   ALT 18 03/08/2011 0754   BILITOT 0.71 09/19/2015 1129   BILITOT 0.4 05/25/2014 0332   BILITOT 0.50 03/08/2011 0754     RADIOLOGY RESULTS: No results found.  ASSESSMENT AND PLAN: This is a very pleasant 74 years old [place] American female with recurrent non-small cell lung cancer most recently treated with maintenance Avastin status post 32 cycles. She is currently on treatment with single agent Tarceva 150 mg by mouth daily status post 10 months and tolerating her treatment fairly well except for mild skin rash and occasional diarrhea. She is tolerating her current treatment with Tarceva 100 milligrams by mouth daily fairly well. She status post 15 months of treatment. I recommended for her to continue Tarceva with the same dose. For the skin rash, she will continue to apply the clindamycin 1% lotion to the skin rash area twice a day. The patient would come back for follow-up visit in one month's for reevaluation with repeat blood work. The patient was advised to call immediately if she has any concerning symptoms in the interval. The patient voices understanding of current disease status and treatment options and is in agreement with the current care plan.  All questions were answered. The patient knows to call the clinic with any problems, questions or concerns. We can certainly see the patient much sooner if necessary.  Disclaimer: This note was dictated with voice recognition software. Similar sounding words can inadvertently be transcribed and may not be corrected upon review.

## 2015-10-17 ENCOUNTER — Telehealth: Payer: Self-pay | Admitting: Internal Medicine

## 2015-10-17 ENCOUNTER — Ambulatory Visit (HOSPITAL_BASED_OUTPATIENT_CLINIC_OR_DEPARTMENT_OTHER): Payer: Medicare Other

## 2015-10-17 ENCOUNTER — Other Ambulatory Visit (HOSPITAL_BASED_OUTPATIENT_CLINIC_OR_DEPARTMENT_OTHER): Payer: Medicare Other

## 2015-10-17 ENCOUNTER — Ambulatory Visit (HOSPITAL_BASED_OUTPATIENT_CLINIC_OR_DEPARTMENT_OTHER): Payer: Medicare Other | Admitting: Internal Medicine

## 2015-10-17 ENCOUNTER — Encounter: Payer: Self-pay | Admitting: Internal Medicine

## 2015-10-17 VITALS — BP 127/50 | HR 78 | Temp 98.0°F | Resp 18 | Ht 65.0 in | Wt 129.5 lb

## 2015-10-17 DIAGNOSIS — C3491 Malignant neoplasm of unspecified part of right bronchus or lung: Secondary | ICD-10-CM

## 2015-10-17 DIAGNOSIS — Z95828 Presence of other vascular implants and grafts: Secondary | ICD-10-CM

## 2015-10-17 DIAGNOSIS — C3411 Malignant neoplasm of upper lobe, right bronchus or lung: Secondary | ICD-10-CM

## 2015-10-17 DIAGNOSIS — E538 Deficiency of other specified B group vitamins: Secondary | ICD-10-CM

## 2015-10-17 DIAGNOSIS — C3492 Malignant neoplasm of unspecified part of left bronchus or lung: Secondary | ICD-10-CM

## 2015-10-17 DIAGNOSIS — R21 Rash and other nonspecific skin eruption: Secondary | ICD-10-CM | POA: Diagnosis not present

## 2015-10-17 DIAGNOSIS — R200 Anesthesia of skin: Secondary | ICD-10-CM

## 2015-10-17 DIAGNOSIS — R350 Frequency of micturition: Secondary | ICD-10-CM

## 2015-10-17 LAB — CBC WITH DIFFERENTIAL/PLATELET
BASO%: 1.3 % (ref 0.0–2.0)
BASOS ABS: 0.1 10*3/uL (ref 0.0–0.1)
EOS ABS: 0.4 10*3/uL (ref 0.0–0.5)
EOS%: 3.8 % (ref 0.0–7.0)
HCT: 34 % — ABNORMAL LOW (ref 34.8–46.6)
HGB: 11.1 g/dL — ABNORMAL LOW (ref 11.6–15.9)
LYMPH#: 1.9 10*3/uL (ref 0.9–3.3)
LYMPH%: 17.6 % (ref 14.0–49.7)
MCH: 28.8 pg (ref 25.1–34.0)
MCHC: 32.7 g/dL (ref 31.5–36.0)
MCV: 88.3 fL (ref 79.5–101.0)
MONO#: 0.7 10*3/uL (ref 0.1–0.9)
MONO%: 7 % (ref 0.0–14.0)
NEUT%: 70.3 % (ref 38.4–76.8)
NEUTROS ABS: 7.4 10*3/uL — AB (ref 1.5–6.5)
PLATELETS: 233 10*3/uL (ref 145–400)
RBC: 3.85 10*6/uL (ref 3.70–5.45)
RDW: 15.2 % — AB (ref 11.2–14.5)
WBC: 10.6 10*3/uL — ABNORMAL HIGH (ref 3.9–10.3)

## 2015-10-17 LAB — COMPREHENSIVE METABOLIC PANEL
ALT: 12 U/L (ref 0–55)
ANION GAP: 9 meq/L (ref 3–11)
AST: 13 U/L (ref 5–34)
Albumin: 3.7 g/dL (ref 3.5–5.0)
Alkaline Phosphatase: 72 U/L (ref 40–150)
BILIRUBIN TOTAL: 0.51 mg/dL (ref 0.20–1.20)
BUN: 14.7 mg/dL (ref 7.0–26.0)
CO2: 23 meq/L (ref 22–29)
CREATININE: 1.2 mg/dL — AB (ref 0.6–1.1)
Calcium: 9.6 mg/dL (ref 8.4–10.4)
Chloride: 108 mEq/L (ref 98–109)
EGFR: 51 mL/min/{1.73_m2} — AB (ref >90)
GLUCOSE: 94 mg/dL (ref 70–140)
POTASSIUM: 3.9 meq/L (ref 3.5–5.1)
Sodium: 140 mEq/L (ref 136–145)
TOTAL PROTEIN: 6.9 g/dL (ref 6.4–8.3)

## 2015-10-17 MED ORDER — SODIUM CHLORIDE 0.9 % IJ SOLN
10.0000 mL | INTRAMUSCULAR | Status: DC | PRN
  Administered 2015-10-17: 10 mL via INTRAVENOUS
  Filled 2015-10-17: qty 10

## 2015-10-17 MED ORDER — HEPARIN SOD (PORK) LOCK FLUSH 100 UNIT/ML IV SOLN
500.0000 [IU] | Freq: Once | INTRAVENOUS | Status: AC
  Administered 2015-10-17: 500 [IU] via INTRAVENOUS
  Filled 2015-10-17: qty 5

## 2015-10-17 MED ORDER — CYANOCOBALAMIN 1000 MCG/ML IJ SOLN
1000.0000 ug | Freq: Once | INTRAMUSCULAR | Status: AC
  Administered 2015-10-17: 1000 ug via INTRAMUSCULAR

## 2015-10-17 NOTE — Telephone Encounter (Signed)
per pof to sch pt appt-gave pt copy of avs-adv pt central [facility] willc all to sch trmt

## 2015-10-17 NOTE — Progress Notes (Signed)
[place] [facility] Telephone:(336) [phone]   Fax:(336) [phone]  OFFICE PROGRESS NOTE  [name], NP [phone] [place] [address]  DIAGNOSIS AND STAGE:  #1 Recurrent non-small cell lung cancer consistent with adenocarcinoma. This was initially diagnosed as stage IB (T2a N0 M0) well-differentiated adenocarcinoma of bronchioalveolar type in December 2010.  #2 Acute pulmonary embolism in the right lower lobe lobar, segmental and subsegmental sized pulmonary arteries, diagnosed on 11/04/2011.   PRIOR THERAPY:  1.Status post right upper lobectomy on September 28, 2009 under the care of Dr. Arlyce Dice. The patient refused adjuvant chemotherapy at that time. She had disease recurrence in June of 2012.  2.Status post 4 cycles of systemic chemotherapy with carboplatin for an AUC of 6 and paclitaxel at 200 mg per meter squared and Avastin at 15 mg/kg according to the ECOG protocol #5508.  3. Chemotherapy with Avastin 15 mg/kg according to the ECOG protocol #5508 status post 32 cycles. Last cycle was given on 06/30/2013 discontinued secondary to disease progression and persistent proteinuria. 4. Tarceva 150 mg by mouth daily, started 08/09/2013, status post 10 months of treatment.  CURRENT THERAPY: Tarceva 100 mg by mouth daily started 07/02/2014, status post 15 months.  CHEMOTHERAPY INTENT: Palliative.  CURRENT # OF CHEMOTHERAPY CYCLES: 16 CURRENT ANTIEMETICS: Compazine  CURRENT SMOKING STATUS: Non-smoker  ORAL CHEMOTHERAPY AND CONSENT: Tarceva and consent was signed 07/21/2013.  CURRENT BISPHOSPHONATES USE: None  PAIN MANAGEMENT: No pain  NARCOTICS INDUCED CONSTIPATION: No constipation.  LIVING WILL AND CODE STATUS: Full code   INTERVAL HISTORY: [name] 75 y.o. female returns to the clinic today for routine monthly followup visit. The patient has no significant complaints today. The patient is feeling fine today with no specific complaints except for the  frequent urinary tract infection and she is currently undergoing treatment by Dr. Risa Grill. She is tolerating her current dose of Tarceva 100 mg by mouth daily fairly well. She denied having any significant nausea or vomiting. She denied having any chest pain but continues to have shortness of breath with exertion with no hemoptysis. The patient denied having any fever or chills.  She had repeat CBC and comprehensive metabolic panel performed earlier today and she is here for evaluation and discussion of her lab results.  MEDICAL HISTORY: Past Medical History  Diagnosis Date  . Hypertension   . Hypothyroidism   . Hypercholesterolemia   . Glaucoma   . Hip pain 09/02/2011  . Foot pain 09/02/2011  . Lung cancer ([place]) 03/08/2011    recurrent  . 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  MEDICATIONS:  Current Outpatient Prescriptions  Medication Sig Dispense Refill  . amLODipine (NORVASC) 5 MG tablet Take 10 mg by mouth daily. Per [name], NP    . aspirin 81 MG tablet Take 81 mg by mouth daily.    . clonazePAM (KLONOPIN) 0.5 MG tablet Take 0.5 mg by mouth 2 (two) times daily as needed. Per [name], NP    . cyanocobalamin (,VITAMIN B-12,) 1000 MCG/ML injection Inject 1,000 mcg into the muscle every 30 (thirty) days.    . dorzolamide-timolol (COSOPT) 22.3-6.8 MG/ML ophthalmic solution Place 2 drops into both eyes 2 (two) times daily. Per [name], NP    . erlotinib (TARCEVA) 100 MG tablet Take 1 tablet (100 mg total) by mouth daily. Take on an empty stomach 1 hour  before meals or 2 hours after 30 tablet 0  . fluticasone (FLONASE) 50 MCG/ACT nasal spray Place 1 spray into both nostrils daily as needed for allergies or rhinitis.    Marland Kitchen levothyroxine (SYNTHROID, LEVOTHROID) 88 MCG tablet Take 88 mcg by mouth daily before breakfast.    . lidocaine-prilocaine (EMLA) cream Apply  1 application topically as needed (prior to port access).     Marland Kitchen lisinopril (PRINIVIL,ZESTRIL) 40 MG tablet Take 40 mg by mouth daily. Per [name], NP     . loperamide (IMODIUM) 2 MG capsule Take 1 capsule (2 mg total) by mouth every 4 (four) hours as needed for diarrhea or loose stools. 30 capsule 0  . TARCEVA 100 MG tablet TAKE 1 TABLET BY MOUTH DAILY ON AN EMPTY STOMACH 30 tablet 1  . Vitamin D, Ergocalciferol, (DRISDOL) 50000 UNITS CAPS capsule Take 1 capsule by mouth once a week.  0  . diphenoxylate-atropine (LOMOTIL) 2.5-0.025 MG per tablet Take 1 to 2 tablets by mouth every 6 hours as needed for diarrhea (Patient not taking: Reported on 10/17/2015) 30 tablet 1  . MYRBETRIQ 50 MG TB24 tablet Take 50 mg by mouth daily.  3  . naproxen (NAPROSYN) 375 MG tablet Take 1 tablet (375 mg total) by mouth 2 (two) times daily. 20 tablet 0  . ondansetron (ZOFRAN) 4 MG tablet Take 1 tablet (4 mg total) by mouth every 8 (eight) hours as needed for nausea or vomiting. (Patient not taking: Reported on 10/17/2015) 20 tablet 0  . prochlorperazine (COMPAZINE) 10 MG tablet Take 10 mg by mouth every 6 (six) hours as needed (for nausea). Reported on 10/17/2015    . trimethoprim (TRIMPEX) 100 MG tablet Reported on 10/17/2015  2   No current facility-administered medications for this visit.    SURGICAL HISTORY:  Past Surgical History  Procedure Laterality Date  . Video assisted thoracoscopy  09/28/2009  . Thoracotomy  09/28/2009    mini  . Lung lobectomy  09/28/2009    right upper   ROS REVIEW OF SYSTEMS:  Constitutional: positive for fatigue Eyes: negative Ears, nose, mouth, throat, and face: negative Respiratory: negative Cardiovascular: negative Gastrointestinal: Negative Genitourinary: Increased urine frequency Integument/breast: Acne-like skin rash on the face especially the nose area. Hematologic/lymphatic: negative Musculoskeletal:negative Neurological: positive for paresthesia Behavioral/Psych:  negative Endocrine: negative Allergic/Immunologic: negative   PHYSICAL EXAMINATION: General appearance: alert, cooperative, fatigued and no distress Head: Normocephalic, without obvious abnormality, atraumatic Neck: no adenopathy, no JVD, supple, symmetrical, trachea midline and thyroid not enlarged, symmetric, no tenderness/mass/nodules Lymph nodes: Cervical, supraclavicular, and axillary nodes normal. Resp: clear to auscultation bilaterally Back: symmetric, no curvature. ROM normal. No CVA tenderness. Cardio: regular rate and rhythm, S1, S2 normal, no murmur, click, rub or gallop GI: soft, non-tender; bowel sounds normal; no masses,  no organomegaly Extremities: extremities normal, atraumatic, no cyanosis or edema Neurologic: Alert and oriented X 3, normal strength and tone. Normal symmetric reflexes. Normal coordination and gait  ECOG PERFORMANCE STATUS: 1 - Symptomatic but completely ambulatory  Blood pressure 127/50, pulse 78, temperature 98 F (36.7 C), temperature source Oral, resp. rate 18, height '5\' 5"'$  (1.651 m), weight 129 lb 8 oz (58.741 kg), SpO2 100 %.  LABORATORY DATA: Lab Results  Component Value Date   WBC 10.6* 10/17/2015   HGB 11.1* 10/17/2015   HCT 34.0* 10/17/2015   MCV 88.3 10/17/2015   PLT 233 10/17/2015      Chemistry      Component Value Date/Time   NA 140  10/17/2015 1438   NA 141 01/11/2015 1603   NA 142 03/08/2011 0754   K 3.9 10/17/2015 1438   K 3.6 01/11/2015 1603   K 4.2 03/08/2011 0754   CL 105 01/11/2015 1603   CL 107 03/30/2013 1036   CL 102 03/08/2011 0754   CO2 23 10/17/2015 1438   CO2 25 05/25/2014 0332   CO2 27 03/08/2011 0754   BUN 14.7 10/17/2015 1438   BUN 10 01/11/2015 1603   BUN 9 03/08/2011 0754   CREATININE 1.2* 10/17/2015 1438   CREATININE 1.20* 01/11/2015 1603   CREATININE 1.0 03/08/2011 0754      Component Value Date/Time   CALCIUM 9.6 10/17/2015 1438   CALCIUM 9.2 05/25/2014 0332   CALCIUM 9.5 03/08/2011 0754    ALKPHOS 72 10/17/2015 1438   ALKPHOS 67 05/25/2014 0332   ALKPHOS 59 03/08/2011 0754   AST 13 10/17/2015 1438   AST 17 05/25/2014 0332   AST 19 03/08/2011 0754   ALT 12 10/17/2015 1438   ALT 9 05/25/2014 0332   ALT 18 03/08/2011 0754   BILITOT 0.51 10/17/2015 1438   BILITOT 0.4 05/25/2014 0332   BILITOT 0.50 03/08/2011 0754     RADIOLOGY RESULTS: No results found.  ASSESSMENT AND PLAN: This is a very pleasant 75 years old [place] American female with recurrent non-small cell lung cancer most recently treated with maintenance Avastin status post 32 cycles. She is currently on treatment with single agent Tarceva 150 mg by mouth daily status post 10 months and tolerating her treatment fairly well except for mild skin rash and occasional diarrhea. She is tolerating her current treatment with Tarceva 100 milligrams by mouth daily fairly well. She status post 16 months of treatment. I recommended for her to continue Tarceva with the same dose. For the skin rash, she will continue to apply the clindamycin 1% lotion to the skin rash area twice a day. The patient would come back for follow-up visit in one month for reevaluation with repeat blood work as well as CT scan of the chest for restaging of her disease. The patient was advised to call immediately if she has any concerning symptoms in the interval. The patient voices understanding of current disease status and treatment options and is in agreement with the current care plan.  All questions were answered. The patient knows to call the clinic with any problems, questions or concerns. We can certainly see the patient much sooner if necessary.  Disclaimer: This note was dictated with voice recognition software. Similar sounding words can inadvertently be transcribed and may not be corrected upon review.

## 2015-10-17 NOTE — Patient Instructions (Signed)

## 2015-10-20 DIAGNOSIS — Z0000 Encounter for general adult medical examination without abnormal findings: Secondary | ICD-10-CM | POA: Diagnosis not present

## 2015-10-20 DIAGNOSIS — N3020 Other chronic cystitis without hematuria: Secondary | ICD-10-CM | POA: Diagnosis not present

## 2015-10-20 DIAGNOSIS — R350 Frequency of micturition: Secondary | ICD-10-CM | POA: Diagnosis not present

## 2015-11-07 DIAGNOSIS — R350 Frequency of micturition: Secondary | ICD-10-CM | POA: Diagnosis not present

## 2015-11-07 DIAGNOSIS — N3020 Other chronic cystitis without hematuria: Secondary | ICD-10-CM | POA: Diagnosis not present

## 2015-11-07 DIAGNOSIS — Z0000 Encounter for general adult medical examination without abnormal findings: Secondary | ICD-10-CM | POA: Diagnosis not present

## 2015-11-17 ENCOUNTER — Other Ambulatory Visit (HOSPITAL_BASED_OUTPATIENT_CLINIC_OR_DEPARTMENT_OTHER): Payer: Medicare Other

## 2015-11-17 ENCOUNTER — Encounter (HOSPITAL_COMMUNITY): Payer: Self-pay

## 2015-11-17 ENCOUNTER — Ambulatory Visit (HOSPITAL_COMMUNITY)
Admission: RE | Admit: 2015-11-17 | Discharge: 2015-11-17 | Disposition: A | Discharge status: Home / Self Care | Payer: Medicare Other | Source: Ambulatory Visit | Attending: Internal Medicine | Admitting: Internal Medicine

## 2015-11-17 DIAGNOSIS — C3491 Malignant neoplasm of unspecified part of right bronchus or lung: Secondary | ICD-10-CM | POA: Insufficient documentation

## 2015-11-17 DIAGNOSIS — J439 Emphysema, unspecified: Secondary | ICD-10-CM | POA: Diagnosis not present

## 2015-11-17 DIAGNOSIS — R350 Frequency of micturition: Secondary | ICD-10-CM | POA: Diagnosis not present

## 2015-11-17 DIAGNOSIS — C3492 Malignant neoplasm of unspecified part of left bronchus or lung: Secondary | ICD-10-CM | POA: Insufficient documentation

## 2015-11-17 DIAGNOSIS — C3490 Malignant neoplasm of unspecified part of unspecified bronchus or lung: Secondary | ICD-10-CM | POA: Diagnosis not present

## 2015-11-17 LAB — COMPREHENSIVE METABOLIC PANEL
ALBUMIN: 4 g/dL (ref 3.5–5.0)
ALT: 14 U/L (ref 0–55)
ANION GAP: 10 meq/L (ref 3–11)
AST: 14 U/L (ref 5–34)
Alkaline Phosphatase: 79 U/L (ref 40–150)
BILIRUBIN TOTAL: 0.79 mg/dL (ref 0.20–1.20)
BUN: 10.3 mg/dL (ref 7.0–26.0)
CALCIUM: 10.1 mg/dL (ref 8.4–10.4)
CO2: 24 mEq/L (ref 22–29)
CREATININE: 1.3 mg/dL — AB (ref 0.6–1.1)
Chloride: 107 mEq/L (ref 98–109)
EGFR: 45 mL/min/{1.73_m2} — ABNORMAL LOW (ref >90)
Glucose: 84 mg/dl (ref 70–140)
Potassium: 3.7 mEq/L (ref 3.5–5.1)
Sodium: 141 mEq/L (ref 136–145)
TOTAL PROTEIN: 7.4 g/dL (ref 6.4–8.3)

## 2015-11-17 LAB — CBC WITH DIFFERENTIAL/PLATELET
BASO%: 0.4 % (ref 0.0–2.0)
Basophils Absolute: 0 10*3/uL (ref 0.0–0.1)
EOS%: 3 % (ref 0.0–7.0)
Eosinophils Absolute: 0.2 10*3/uL (ref 0.0–0.5)
HEMATOCRIT: 38.7 % (ref 34.8–46.6)
HEMOGLOBIN: 12.4 g/dL (ref 11.6–15.9)
LYMPH#: 1.5 10*3/uL (ref 0.9–3.3)
LYMPH%: 19 % (ref 14.0–49.7)
MCH: 28.9 pg (ref 25.1–34.0)
MCHC: 32 g/dL (ref 31.5–36.0)
MCV: 90.2 fL (ref 79.5–101.0)
MONO#: 0.5 10*3/uL (ref 0.1–0.9)
MONO%: 6.1 % (ref 0.0–14.0)
NEUT%: 71.5 % (ref 38.4–76.8)
NEUTROS ABS: 5.6 10*3/uL (ref 1.5–6.5)
PLATELETS: 217 10*3/uL (ref 145–400)
RBC: 4.29 10*6/uL (ref 3.70–5.45)
RDW: 15.3 % — AB (ref 11.2–14.5)
WBC: 7.9 10*3/uL (ref 3.9–10.3)

## 2015-11-21 ENCOUNTER — Encounter: Payer: Self-pay | Admitting: Internal Medicine

## 2015-11-21 ENCOUNTER — Ambulatory Visit (HOSPITAL_BASED_OUTPATIENT_CLINIC_OR_DEPARTMENT_OTHER): Payer: Medicare Other | Admitting: Internal Medicine

## 2015-11-21 ENCOUNTER — Ambulatory Visit (HOSPITAL_BASED_OUTPATIENT_CLINIC_OR_DEPARTMENT_OTHER): Payer: Medicare Other

## 2015-11-21 ENCOUNTER — Telehealth: Payer: Self-pay | Admitting: Internal Medicine

## 2015-11-21 ENCOUNTER — Other Ambulatory Visit: Payer: Self-pay | Admitting: *Deleted

## 2015-11-21 ENCOUNTER — Encounter: Payer: Self-pay | Admitting: *Deleted

## 2015-11-21 VITALS — BP 135/67 | HR 73 | Temp 97.8°F | Resp 17 | Ht 65.0 in | Wt 130.4 lb

## 2015-11-21 DIAGNOSIS — E538 Deficiency of other specified B group vitamins: Secondary | ICD-10-CM | POA: Diagnosis not present

## 2015-11-21 DIAGNOSIS — C3491 Malignant neoplasm of unspecified part of right bronchus or lung: Secondary | ICD-10-CM | POA: Diagnosis not present

## 2015-11-21 DIAGNOSIS — C3492 Malignant neoplasm of unspecified part of left bronchus or lung: Principal | ICD-10-CM

## 2015-11-21 DIAGNOSIS — G620 Drug-induced polyneuropathy: Secondary | ICD-10-CM

## 2015-11-21 DIAGNOSIS — R530 Neoplastic (malignant) related fatigue: Secondary | ICD-10-CM

## 2015-11-21 DIAGNOSIS — Z86711 Personal history of pulmonary embolism: Secondary | ICD-10-CM

## 2015-11-21 DIAGNOSIS — R200 Anesthesia of skin: Secondary | ICD-10-CM

## 2015-11-21 DIAGNOSIS — C3411 Malignant neoplasm of upper lobe, right bronchus or lung: Secondary | ICD-10-CM

## 2015-11-21 DIAGNOSIS — R197 Diarrhea, unspecified: Secondary | ICD-10-CM

## 2015-11-21 MED ORDER — CYANOCOBALAMIN 1000 MCG/ML IJ SOLN
1000.0000 ug | Freq: Once | INTRAMUSCULAR | Status: AC
  Administered 2015-11-21: 1000 ug via INTRAMUSCULAR

## 2015-11-21 NOTE — Telephone Encounter (Signed)
per pof to sch pt appt-gave pt copy of avs °

## 2015-11-21 NOTE — Progress Notes (Signed)
[facility] [phone]   [phone]  OFFICE PROGRESS NOTE  Terri Levels, NP [phone] [address] [place] [address]  DIAGNOSIS AND STAGE:  #1 Recurrent non-small cell lung cancer consistent with adenocarcinoma. This was initially diagnosed as stage IB (T2a N0 M0) well-differentiated adenocarcinoma of bronchioalveolar type in December 2010.  #2 Acute pulmonary embolism in the right lower lobe lobar, segmental and subsegmental sized pulmonary arteries, diagnosed on 11/04/2011.   PRIOR THERAPY:  1.Status post right upper lobectomy on September 28, 2009 under the care of Dr. Arlyce Dice. The patient refused adjuvant chemotherapy at that time. She had disease recurrence in June of 2012.  2.Status post 4 cycles of systemic chemotherapy with carboplatin for an AUC of 6 and paclitaxel at 200 mg per meter squared and Avastin at 15 mg/kg according to the ECOG protocol #5508.  3. Chemotherapy with Avastin 15 mg/kg according to the ECOG protocol #5508 status post 32 cycles. Last cycle was given on 06/30/2013 discontinued secondary to disease progression and persistent proteinuria. 4. Tarceva 150 mg by mouth daily, started 08/09/2013, status post 10 months of treatment.  CURRENT THERAPY: Tarceva 100 mg by mouth daily started 07/02/2014, status post 16 months.  CHEMOTHERAPY INTENT: Palliative.  CURRENT # OF CHEMOTHERAPY CYCLES: 17 CURRENT ANTIEMETICS: Compazine  CURRENT SMOKING STATUS: Non-smoker  ORAL CHEMOTHERAPY AND CONSENT: Tarceva and consent was signed 07/21/2013.  CURRENT BISPHOSPHONATES USE: None  PAIN MANAGEMENT: No pain  NARCOTICS INDUCED CONSTIPATION: No constipation.  LIVING WILL AND CODE STATUS: Full code  INTERVAL HISTORY: [name] 75 y.o. female returns to the clinic today for follow-up visit. The patient is feeling fine today with no specific complaints except for mild fatigue. She is tolerating her treatment with Tarceva fairly well  except for few episodes of diarrhea and she takes Imodium on as-needed basis. She denied having any significant chest pain, shortness of breath, cough or hemoptysis. She denied having any weight loss or night sweats. She has no nausea or vomiting. She has no fever or chills. The patient had repeat CT scan of the chest performed recently and she is here for evaluation and discussion of her scan results.  MEDICAL HISTORY: Past Medical History  Diagnosis Date  . Hypertension   . Hypothyroidism   . Hypercholesterolemia   . Glaucoma   . Hip pain 09/02/2011  . Foot pain 09/02/2011  . Lung cancer ([facility]) 03/08/2011    recurrent  . 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   SURGICAL HISTORY:  Past Surgical History  Procedure Laterality Date  . Video assisted thoracoscopy  09/28/2009  . Thoracotomy  09/28/2009    mini  . Lung lobectomy  09/28/2009    right upper    REVIEW OF SYSTEMS:  Constitutional: positive for fatigue Eyes: negative Ears, nose, mouth, throat, and face: negative Respiratory: negative Cardiovascular: negative Gastrointestinal: positive for diarrhea Genitourinary:negative Integument/breast: negative Hematologic/lymphatic: negative Musculoskeletal:negative Neurological: negative Behavioral/Psych: negative Endocrine: negative Allergic/Immunologic: negative   PHYSICAL EXAMINATION: General appearance: alert, cooperative, fatigued and no distress Head: Normocephalic, without obvious abnormality, atraumatic Neck: no adenopathy, no JVD, supple, symmetrical, trachea midline and thyroid not enlarged, symmetric, no tenderness/mass/nodules Lymph nodes: Cervical, supraclavicular, and axillary nodes normal. Resp: clear to auscultation bilaterally Back: symmetric, no curvature. ROM normal. No CVA tenderness. Cardio: regular rate and rhythm, S1, S2 normal, no murmur,  click, rub or  gallop GI: soft, non-tender; bowel sounds normal; no masses,  no organomegaly Extremities: extremities normal, atraumatic, no cyanosis or edema Neurologic: Alert and oriented X 3, normal strength and tone. Normal symmetric reflexes. Normal coordination and gait  ECOG PERFORMANCE STATUS: 1 - Symptomatic but completely ambulatory  Blood pressure 135/67, pulse 73, temperature 97.8 F (36.6 C), temperature source Oral, resp. rate 17, height '5\' 5"'$  (1.651 m), weight 130 lb 6.4 oz (59.149 kg), SpO2 94 %.  LABORATORY DATA: Lab Results  Component Value Date   WBC 7.9 11/17/2015   HGB 12.4 11/17/2015   HCT 38.7 11/17/2015   MCV 90.2 11/17/2015   PLT 217 11/17/2015      Chemistry      Component Value Date/Time   NA 141 11/17/2015 1008   NA 141 01/11/2015 1603   NA 142 03/08/2011 0754   K 3.7 11/17/2015 1008   K 3.6 01/11/2015 1603   K 4.2 03/08/2011 0754   CL 105 01/11/2015 1603   CL 107 03/30/2013 1036   CL 102 03/08/2011 0754   CO2 24 11/17/2015 1008   CO2 25 05/25/2014 0332   CO2 27 03/08/2011 0754   BUN 10.3 11/17/2015 1008   BUN 10 01/11/2015 1603   BUN 9 03/08/2011 0754   CREATININE 1.3* 11/17/2015 1008   CREATININE 1.20* 01/11/2015 1603   CREATININE 1.0 03/08/2011 0754      Component Value Date/Time   CALCIUM 10.1 11/17/2015 1008   CALCIUM 9.2 05/25/2014 0332   CALCIUM 9.5 03/08/2011 0754   ALKPHOS 79 11/17/2015 1008   ALKPHOS 67 05/25/2014 0332   ALKPHOS 59 03/08/2011 0754   AST 14 11/17/2015 1008   AST 17 05/25/2014 0332   AST 19 03/08/2011 0754   ALT 14 11/17/2015 1008   ALT 9 05/25/2014 0332   ALT 18 03/08/2011 0754   BILITOT 0.79 11/17/2015 1008   BILITOT 0.4 05/25/2014 0332   BILITOT 0.50 03/08/2011 0754       RADIOGRAPHIC STUDIES: Ct Chest Wo Contrast  11/17/2015  CLINICAL DATA:  Lung cancer restaging. EXAM: CT CHEST WITHOUT CONTRAST TECHNIQUE: Multidetector CT imaging of the chest was performed following the standard protocol without IV  contrast. COMPARISON:  08/14/2015.  05/09/2015. FINDINGS: Mediastinum / Lymph Nodes: The tip of the right-sided Port-A-Cath is positioned at the junction of the SVC and RA. There is no axillary lymphadenopathy. No mediastinal lymphadenopathy. Calcified lymph nodes are seen in the left hilum. The heart size is normal. No pericardial effusion. Coronary artery calcification is noted. Lungs / Pleura: Emphysema again noted with architectural distortion in both lungs and postsurgical change in the anterior right lung. The predominantly ground-glass nodule in the right lower lobe measured previously 1.9 x 2.1 cm is stable at 1.8 x 2.3 cm on image 26 series 5 today. Previously described mixed solid and ground-glass nodule in the medial right lower lobe which was 1.7 x 3.3 cm previously now measures 1.8 x 1.3 cm nodular component along its inferior margin is stable at 1.8 x 0.9 cm. Dominant nodule seen in the left lower lobe measures 4.7 x 6.1 cm today compared to 5.3 x 5.8 cm previously. Nodular component on today's study is 2.6 x 2.3 cm compared to 2.7 x 2.1 cm previously. As before, there are multiple other scattered ill-defined areas of ground-glass attenuation +/minus architectural distortion. Upper Abdomen: Calcified granulomata are seen throughout the liver and spleen. Tiny probable cyst noted in the dome of the liver, stable at 6-7 mm. No evidence for  adrenal mass. MSK / Soft Tissues: Bone windows reveal no worrisome lytic or sclerotic osseous lesions. IMPRESSION: No substantial interval change in the multiple mixed ground-glass and solid lesions throughout both lungs. Emphysema. Electronically Signed   By: Misty Stanley M.D.   On: 11/17/2015 12:39    ASSESSMENT AND PLAN: This is a very pleasant 75 years old African-American female with recurrent non-small cell lung cancer, adenocarcinoma presented with multiple pulmonary nodules currently on treatment with Tarceva 100 mg by mouth daily status post 16 months. She  is tolerating her treatment fairly well with no significant adverse effects except for mild fatigue and few episodes of diarrhea. The recent CT scan of the chest showed no concerning findings for disease progression. I discussed the scan results with the patient today. I recommended for her to continue on treatment with Tarceva with the same dose. For diarrhea, she will continue on Imodium an as-needed basis. For the peripheral neuropathy, the patient will continue vitamin B 12 injection but every 6 weeks. I will see her back for follow-up visit in 6 weeks for reevaluation with repeat blood work. She was advised to call immediately if she has any concerning symptoms in the interval. The patient voices understanding of current disease status and treatment options and is in agreement with the current care plan.  All questions were answered. The patient knows to call the clinic with any problems, questions or concerns. We can certainly see the patient much sooner if necessary.  I spent 15 minutes counseling the patient face to face. The total time spent in the appointment was 25 minutes.  Disclaimer: This note was dictated with voice recognition software. Similar sounding words can inadvertently be transcribed and may not be corrected upon review.

## 2015-12-01 DIAGNOSIS — Z0000 Encounter for general adult medical examination without abnormal findings: Secondary | ICD-10-CM | POA: Diagnosis not present

## 2015-12-01 DIAGNOSIS — R350 Frequency of micturition: Secondary | ICD-10-CM | POA: Diagnosis not present

## 2015-12-01 DIAGNOSIS — N3020 Other chronic cystitis without hematuria: Secondary | ICD-10-CM | POA: Diagnosis not present

## 2015-12-14 ENCOUNTER — Other Ambulatory Visit: Payer: Self-pay | Admitting: Internal Medicine

## 2016-01-02 ENCOUNTER — Telehealth: Payer: Self-pay | Admitting: *Deleted

## 2016-01-02 ENCOUNTER — Ambulatory Visit (HOSPITAL_BASED_OUTPATIENT_CLINIC_OR_DEPARTMENT_OTHER): Payer: Medicare Other

## 2016-01-02 ENCOUNTER — Encounter: Payer: Self-pay | Admitting: Internal Medicine

## 2016-01-02 ENCOUNTER — Other Ambulatory Visit (HOSPITAL_BASED_OUTPATIENT_CLINIC_OR_DEPARTMENT_OTHER): Payer: Medicare Other

## 2016-01-02 ENCOUNTER — Ambulatory Visit (HOSPITAL_BASED_OUTPATIENT_CLINIC_OR_DEPARTMENT_OTHER): Payer: Medicare Other | Admitting: Internal Medicine

## 2016-01-02 ENCOUNTER — Telehealth: Payer: Self-pay | Admitting: Internal Medicine

## 2016-01-02 VITALS — BP 144/69 | HR 77 | Temp 98.0°F | Resp 17 | Ht 65.0 in | Wt 131.0 lb

## 2016-01-02 DIAGNOSIS — G620 Drug-induced polyneuropathy: Secondary | ICD-10-CM | POA: Diagnosis not present

## 2016-01-02 DIAGNOSIS — R197 Diarrhea, unspecified: Secondary | ICD-10-CM | POA: Diagnosis not present

## 2016-01-02 DIAGNOSIS — E538 Deficiency of other specified B group vitamins: Secondary | ICD-10-CM

## 2016-01-02 DIAGNOSIS — R200 Anesthesia of skin: Secondary | ICD-10-CM

## 2016-01-02 DIAGNOSIS — C3491 Malignant neoplasm of unspecified part of right bronchus or lung: Secondary | ICD-10-CM

## 2016-01-02 DIAGNOSIS — Z95828 Presence of other vascular implants and grafts: Secondary | ICD-10-CM

## 2016-01-02 DIAGNOSIS — C3411 Malignant neoplasm of upper lobe, right bronchus or lung: Secondary | ICD-10-CM

## 2016-01-02 DIAGNOSIS — C3492 Malignant neoplasm of unspecified part of left bronchus or lung: Secondary | ICD-10-CM

## 2016-01-02 LAB — CBC WITH DIFFERENTIAL/PLATELET
BASO%: 0.7 % (ref 0.0–2.0)
Basophils Absolute: 0.1 10*3/uL (ref 0.0–0.1)
EOS ABS: 0.4 10*3/uL (ref 0.0–0.5)
EOS%: 3.6 % (ref 0.0–7.0)
HCT: 36 % (ref 34.8–46.6)
HEMOGLOBIN: 11.7 g/dL (ref 11.6–15.9)
LYMPH%: 19.8 % (ref 14.0–49.7)
MCH: 28.9 pg (ref 25.1–34.0)
MCHC: 32.6 g/dL (ref 31.5–36.0)
MCV: 88.8 fL (ref 79.5–101.0)
MONO#: 0.6 10*3/uL (ref 0.1–0.9)
MONO%: 6.6 % (ref 0.0–14.0)
NEUT%: 69.3 % (ref 38.4–76.8)
NEUTROS ABS: 6.7 10*3/uL — AB (ref 1.5–6.5)
PLATELETS: 193 10*3/uL (ref 145–400)
RBC: 4.05 10*6/uL (ref 3.70–5.45)
RDW: 14.2 % (ref 11.2–14.5)
WBC: 9.7 10*3/uL (ref 3.9–10.3)
lymph#: 1.9 10*3/uL (ref 0.9–3.3)

## 2016-01-02 LAB — COMPREHENSIVE METABOLIC PANEL
ALBUMIN: 3.7 g/dL (ref 3.5–5.0)
ALK PHOS: 72 U/L (ref 40–150)
ALT: 13 U/L (ref 0–55)
ANION GAP: 7 meq/L (ref 3–11)
AST: 11 U/L (ref 5–34)
BILIRUBIN TOTAL: 0.71 mg/dL (ref 0.20–1.20)
BUN: 14 mg/dL (ref 7.0–26.0)
CO2: 25 mEq/L (ref 22–29)
Calcium: 9.8 mg/dL (ref 8.4–10.4)
Chloride: 109 mEq/L (ref 98–109)
Creatinine: 1.2 mg/dL — ABNORMAL HIGH (ref 0.6–1.1)
EGFR: 49 mL/min/{1.73_m2} — AB (ref >90)
GLUCOSE: 77 mg/dL (ref 70–140)
Potassium: 4 mEq/L (ref 3.5–5.1)
Sodium: 141 mEq/L (ref 136–145)
TOTAL PROTEIN: 6.8 g/dL (ref 6.4–8.3)

## 2016-01-02 MED ORDER — SODIUM CHLORIDE 0.9% FLUSH
10.0000 mL | INTRAVENOUS | Status: DC | PRN
  Administered 2016-01-02: 10 mL via INTRAVENOUS
  Filled 2016-01-02: qty 10

## 2016-01-02 MED ORDER — HEPARIN SOD (PORK) LOCK FLUSH 100 UNIT/ML IV SOLN
500.0000 [IU] | Freq: Once | INTRAVENOUS | Status: AC
  Administered 2016-01-02: 500 [IU] via INTRAVENOUS
  Filled 2016-01-02: qty 5

## 2016-01-02 MED ORDER — CYANOCOBALAMIN 1000 MCG/ML IJ SOLN
1000.0000 ug | Freq: Once | INTRAMUSCULAR | Status: AC
  Administered 2016-01-02: 1000 ug via INTRAMUSCULAR

## 2016-01-02 NOTE — Telephone Encounter (Signed)
FYI "[name] with [employer] member services.  Calling to schedule a wellness visit appointment for this patient."   Has reached patient's Hematolgy/Oncology specialty office.  Patient here today for F/U.  Provided Nurse Practioner's name per EHR Care Team listed as the PCP.  [name] reports our number is the number she provided.

## 2016-01-02 NOTE — Patient Instructions (Signed)

## 2016-01-02 NOTE — Progress Notes (Signed)
[facility] [phone]   [phone]  OFFICE PROGRESS NOTE  Terri Levels, NP [phone] [address] [place] [address]  DIAGNOSIS AND STAGE:  #1 Recurrent non-small cell lung cancer consistent with adenocarcinoma. This was initially diagnosed as stage IB (T2a N0 M0) well-differentiated adenocarcinoma of bronchioalveolar type in December 2010.  #2 Acute pulmonary embolism in the right lower lobe lobar, segmental and subsegmental sized pulmonary arteries, diagnosed on 11/04/2011.   PRIOR THERAPY:  1.Status post right upper lobectomy on September 28, 2009 under the care of Dr. Arlyce Dice. The patient refused adjuvant chemotherapy at that time. She had disease recurrence in June of 2012.  2.Status post 4 cycles of systemic chemotherapy with carboplatin for an AUC of 6 and paclitaxel at 200 mg per meter squared and Avastin at 15 mg/kg according to the ECOG protocol #5508.  3. Chemotherapy with Avastin 15 mg/kg according to the ECOG protocol #5508 status post 32 cycles. Last cycle was given on 06/30/2013 discontinued secondary to disease progression and persistent proteinuria. 4. Tarceva 150 mg by mouth daily, started 08/09/2013, status post 10 months of treatment.  CURRENT THERAPY: Tarceva 100 mg by mouth daily started 07/02/2014, status post 17 months.  CHEMOTHERAPY INTENT: Palliative.  CURRENT # OF CHEMOTHERAPY CYCLES: 19 CURRENT ANTIEMETICS: Compazine  CURRENT SMOKING STATUS: Non-smoker  ORAL CHEMOTHERAPY AND CONSENT: Tarceva and consent was signed 07/21/2013.  CURRENT BISPHOSPHONATES USE: None  PAIN MANAGEMENT: No pain  NARCOTICS INDUCED CONSTIPATION: No constipation.  LIVING WILL AND CODE STATUS: Full code  INTERVAL HISTORY: [name] 75 y.o. female returns to the clinic today for follow-up visit. The patient is feeling fine today with no specific complaints except for mild fatigue. She is tolerating her treatment with Tarceva fairly well  except for 1-2 episodes of diarrhea every other day and she takes Imodium on as-needed basis. She had one episode of bloody mucous diarrhea 10 days ago associated with vomiting but this completely resolved. She denied having any significant chest pain, shortness of breath, cough or hemoptysis. She denied having any weight loss or night sweats. She has no nausea or vomiting. She has no fever or chills. She is here today for evaluation with repeat blood work.  MEDICAL HISTORY: Past Medical History  Diagnosis Date  . Hypertension   . Hypothyroidism   . Hypercholesterolemia   . Glaucoma   . Hip pain 09/02/2011  . Foot pain 09/02/2011  . Lung cancer ([facility]) 03/08/2011    recurrent  . 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   SURGICAL HISTORY:  Past Surgical History  Procedure Laterality Date  . Video assisted thoracoscopy  09/28/2009  . Thoracotomy  09/28/2009    mini  . Lung lobectomy  09/28/2009    right upper    REVIEW OF SYSTEMS:  A comprehensive review of systems was negative except for: Constitutional: positive for fatigue Gastrointestinal: positive for diarrhea   PHYSICAL EXAMINATION: General appearance: alert, cooperative, fatigued and no distress Head: Normocephalic, without obvious abnormality, atraumatic Neck: no adenopathy, no JVD, supple, symmetrical, trachea midline and thyroid not enlarged, symmetric, no tenderness/mass/nodules Lymph nodes: Cervical, supraclavicular, and axillary nodes normal. Resp: clear to auscultation bilaterally Back: symmetric, no curvature. ROM normal. No CVA tenderness. Cardio: regular rate and rhythm, S1, S2 normal, no murmur, click, rub or gallop GI: soft, non-tender; bowel sounds normal; no masses,  no  organomegaly Extremities: extremities normal, atraumatic, no cyanosis or edema Neurologic: Alert and oriented X 3, normal strength  and tone. Normal symmetric reflexes. Normal coordination and gait  ECOG PERFORMANCE STATUS: 1 - Symptomatic but completely ambulatory  There were no vitals taken for this visit.  LABORATORY DATA: Lab Results  Component Value Date   WBC 9.7 01/02/2016   HGB 11.7 01/02/2016   HCT 36.0 01/02/2016   MCV 88.8 01/02/2016   PLT 193 01/02/2016      Chemistry      Component Value Date/Time   NA 141 11/17/2015 1008   NA 141 01/11/2015 1603   NA 142 03/08/2011 0754   K 3.7 11/17/2015 1008   K 3.6 01/11/2015 1603   K 4.2 03/08/2011 0754   CL 105 01/11/2015 1603   CL 107 03/30/2013 1036   CL 102 03/08/2011 0754   CO2 24 11/17/2015 1008   CO2 25 05/25/2014 0332   CO2 27 03/08/2011 0754   BUN 10.3 11/17/2015 1008   BUN 10 01/11/2015 1603   BUN 9 03/08/2011 0754   CREATININE 1.3* 11/17/2015 1008   CREATININE 1.20* 01/11/2015 1603   CREATININE 1.0 03/08/2011 0754      Component Value Date/Time   CALCIUM 10.1 11/17/2015 1008   CALCIUM 9.2 05/25/2014 0332   CALCIUM 9.5 03/08/2011 0754   ALKPHOS 79 11/17/2015 1008   ALKPHOS 67 05/25/2014 0332   ALKPHOS 59 03/08/2011 0754   AST 14 11/17/2015 1008   AST 17 05/25/2014 0332   AST 19 03/08/2011 0754   ALT 14 11/17/2015 1008   ALT 9 05/25/2014 0332   ALT 18 03/08/2011 0754   BILITOT 0.79 11/17/2015 1008   BILITOT 0.4 05/25/2014 0332   BILITOT 0.50 03/08/2011 0754       RADIOGRAPHIC STUDIES: No results found.  ASSESSMENT AND PLAN: This is a very pleasant 75 years old African-American female with recurrent non-small cell lung cancer, adenocarcinoma presented with multiple pulmonary nodules currently on treatment with Tarceva 100 mg by mouth daily status post more than 17 months. She is tolerating her treatment fairly well with no significant adverse effects except for mild fatigue and few episodes of diarrhea. I recommended for her to continue on treatment with Tarceva with the same dose. For diarrhea, she will continue on Imodium  an as-needed basis. For the peripheral neuropathy, the patient will continue vitamin B 12 injection but every 6 weeks. I will see her back for follow-up visit in 6 weeks for reevaluation with repeat blood work as well as CT scan of the chest without contrast for restaging of her disease. She was advised to call immediately if she has any concerning symptoms in the interval. The patient voices understanding of current disease status and treatment options and is in agreement with the current care plan.  All questions were answered. The patient knows to call the clinic with any problems, questions or concerns. We can certainly see the patient much sooner if necessary.  Disclaimer: This note was dictated with voice recognition software. Similar sounding words can inadvertently be transcribed and may not be corrected upon review.

## 2016-01-02 NOTE — Telephone Encounter (Signed)
Gave and printed appt sched and avs fo rpt for May °

## 2016-02-09 ENCOUNTER — Other Ambulatory Visit: Payer: Self-pay

## 2016-02-09 DIAGNOSIS — Z95828 Presence of other vascular implants and grafts: Secondary | ICD-10-CM | POA: Insufficient documentation

## 2016-02-12 ENCOUNTER — Encounter: Payer: Self-pay | Admitting: *Deleted

## 2016-02-12 ENCOUNTER — Encounter (HOSPITAL_COMMUNITY): Payer: Self-pay

## 2016-02-12 ENCOUNTER — Other Ambulatory Visit (HOSPITAL_BASED_OUTPATIENT_CLINIC_OR_DEPARTMENT_OTHER): Payer: Medicare Other

## 2016-02-12 ENCOUNTER — Ambulatory Visit (HOSPITAL_BASED_OUTPATIENT_CLINIC_OR_DEPARTMENT_OTHER): Payer: Medicare Other

## 2016-02-12 ENCOUNTER — Ambulatory Visit (HOSPITAL_COMMUNITY)
Admission: RE | Admit: 2016-02-12 | Discharge: 2016-02-12 | Disposition: A | Discharge status: Home / Self Care | Payer: Medicare Other | Source: Ambulatory Visit | Attending: Internal Medicine | Admitting: Internal Medicine

## 2016-02-12 DIAGNOSIS — I2510 Atherosclerotic heart disease of native coronary artery without angina pectoris: Secondary | ICD-10-CM | POA: Diagnosis not present

## 2016-02-12 DIAGNOSIS — R911 Solitary pulmonary nodule: Secondary | ICD-10-CM | POA: Diagnosis not present

## 2016-02-12 DIAGNOSIS — C3492 Malignant neoplasm of unspecified part of left bronchus or lung: Secondary | ICD-10-CM | POA: Insufficient documentation

## 2016-02-12 DIAGNOSIS — C3491 Malignant neoplasm of unspecified part of right bronchus or lung: Secondary | ICD-10-CM

## 2016-02-12 DIAGNOSIS — Z95828 Presence of other vascular implants and grafts: Secondary | ICD-10-CM

## 2016-02-12 DIAGNOSIS — R918 Other nonspecific abnormal finding of lung field: Secondary | ICD-10-CM | POA: Diagnosis not present

## 2016-02-12 LAB — COMPREHENSIVE METABOLIC PANEL
ALK PHOS: 74 U/L (ref 40–150)
ALT: 12 U/L (ref 0–55)
ANION GAP: 7 meq/L (ref 3–11)
AST: 13 U/L (ref 5–34)
Albumin: 3.7 g/dL (ref 3.5–5.0)
BUN: 11.3 mg/dL (ref 7.0–26.0)
CALCIUM: 9.7 mg/dL (ref 8.4–10.4)
CO2: 24 meq/L (ref 22–29)
CREATININE: 1.3 mg/dL — AB (ref 0.6–1.1)
Chloride: 111 mEq/L — ABNORMAL HIGH (ref 98–109)
EGFR: 48 mL/min/{1.73_m2} — AB (ref >90)
Glucose: 99 mg/dl (ref 70–140)
Potassium: 3.8 mEq/L (ref 3.5–5.1)
Sodium: 141 mEq/L (ref 136–145)
TOTAL PROTEIN: 6.8 g/dL (ref 6.4–8.3)
Total Bilirubin: 0.81 mg/dL (ref 0.20–1.20)

## 2016-02-12 LAB — CBC WITH DIFFERENTIAL/PLATELET
BASO%: 0.8 % (ref 0.0–2.0)
Basophils Absolute: 0.1 10*3/uL (ref 0.0–0.1)
EOS ABS: 0.3 10*3/uL (ref 0.0–0.5)
EOS%: 3.5 % (ref 0.0–7.0)
HEMATOCRIT: 35.7 % (ref 34.8–46.6)
HGB: 11.6 g/dL (ref 11.6–15.9)
LYMPH#: 1.7 10*3/uL (ref 0.9–3.3)
LYMPH%: 21 % (ref 14.0–49.7)
MCH: 29.1 pg (ref 25.1–34.0)
MCHC: 32.6 g/dL (ref 31.5–36.0)
MCV: 89.1 fL (ref 79.5–101.0)
MONO#: 0.5 10*3/uL (ref 0.1–0.9)
MONO%: 6.8 % (ref 0.0–14.0)
NEUT%: 67.9 % (ref 38.4–76.8)
NEUTROS ABS: 5.4 10*3/uL (ref 1.5–6.5)
PLATELETS: 199 10*3/uL (ref 145–400)
RBC: 4 10*6/uL (ref 3.70–5.45)
RDW: 14.1 % (ref 11.2–14.5)
WBC: 8 10*3/uL (ref 3.9–10.3)

## 2016-02-12 MED ORDER — SODIUM CHLORIDE 0.9 % IJ SOLN
10.0000 mL | INTRAMUSCULAR | Status: DC | PRN
  Administered 2016-02-12: 10 mL via INTRAVENOUS
  Filled 2016-02-12: qty 10

## 2016-02-12 NOTE — Patient Instructions (Signed)

## 2016-02-14 ENCOUNTER — Ambulatory Visit: Payer: Medicare Other

## 2016-02-14 ENCOUNTER — Telehealth: Payer: Self-pay | Admitting: *Deleted

## 2016-02-14 ENCOUNTER — Encounter: Payer: Self-pay | Admitting: Internal Medicine

## 2016-02-14 ENCOUNTER — Telehealth: Payer: Self-pay | Admitting: Internal Medicine

## 2016-02-14 ENCOUNTER — Encounter: Payer: Self-pay | Admitting: *Deleted

## 2016-02-14 ENCOUNTER — Ambulatory Visit (HOSPITAL_BASED_OUTPATIENT_CLINIC_OR_DEPARTMENT_OTHER): Payer: Medicare Other | Admitting: Internal Medicine

## 2016-02-14 VITALS — BP 140/66 | HR 68 | Temp 97.8°F | Resp 18 | Ht 65.0 in | Wt 132.6 lb

## 2016-02-14 DIAGNOSIS — Z5112 Encounter for antineoplastic immunotherapy: Secondary | ICD-10-CM

## 2016-02-14 DIAGNOSIS — R5382 Chronic fatigue, unspecified: Secondary | ICD-10-CM

## 2016-02-14 DIAGNOSIS — R197 Diarrhea, unspecified: Secondary | ICD-10-CM

## 2016-02-14 DIAGNOSIS — R200 Anesthesia of skin: Secondary | ICD-10-CM | POA: Diagnosis not present

## 2016-02-14 DIAGNOSIS — C3431 Malignant neoplasm of lower lobe, right bronchus or lung: Secondary | ICD-10-CM | POA: Diagnosis not present

## 2016-02-14 DIAGNOSIS — Z95828 Presence of other vascular implants and grafts: Secondary | ICD-10-CM

## 2016-02-14 DIAGNOSIS — E538 Deficiency of other specified B group vitamins: Secondary | ICD-10-CM

## 2016-02-14 DIAGNOSIS — C3491 Malignant neoplasm of unspecified part of right bronchus or lung: Secondary | ICD-10-CM

## 2016-02-14 DIAGNOSIS — C3492 Malignant neoplasm of unspecified part of left bronchus or lung: Principal | ICD-10-CM

## 2016-02-14 DIAGNOSIS — C3411 Malignant neoplasm of upper lobe, right bronchus or lung: Secondary | ICD-10-CM

## 2016-02-14 HISTORY — DX: Chronic fatigue, unspecified: R53.82

## 2016-02-14 HISTORY — DX: Encounter for antineoplastic immunotherapy: Z51.12

## 2016-02-14 MED ORDER — HEPARIN SOD (PORK) LOCK FLUSH 100 UNIT/ML IV SOLN
500.0000 [IU] | Freq: Once | INTRAVENOUS | Status: DC | PRN
  Filled 2016-02-14: qty 5

## 2016-02-14 MED ORDER — ALTEPLASE 2 MG IJ SOLR
2.0000 mg | Freq: Once | INTRAMUSCULAR | Status: DC | PRN
  Filled 2016-02-14: qty 2

## 2016-02-14 MED ORDER — CYANOCOBALAMIN 1000 MCG/ML IJ SOLN
1000.0000 ug | Freq: Once | INTRAMUSCULAR | Status: AC
  Administered 2016-02-14: 1000 ug via INTRAMUSCULAR

## 2016-02-14 NOTE — Telephone Encounter (Signed)
per pof to sch pt appt*gave pt copy of avs-MW sch trmt

## 2016-02-14 NOTE — Progress Notes (Signed)
[place] Telephone:([phone]) [phone]   Fax:(336) [phone]  OFFICE PROGRESS NOTE  Terri Levels, [name] [phone] [place] [address]  DIAGNOSIS AND STAGE:  #1 Recurrent non-small cell lung cancer consistent with adenocarcinoma. This was initially diagnosed as stage IB (T2a N0 M0) well-differentiated adenocarcinoma of bronchioalveolar type in December 2010.  #2 Acute pulmonary embolism in the right lower lobe lobar, segmental and subsegmental sized pulmonary arteries, diagnosed on 11/04/2011.   PRIOR THERAPY:  1.Status post right upper lobectomy on September 28, 2009 under the care of Dr. Arlyce Dice. The patient refused adjuvant chemotherapy at that time. She had disease recurrence in June of 2012.  2.Status post 4 cycles of systemic chemotherapy with carboplatin for an AUC of 6 and paclitaxel at 200 mg per meter squared and Avastin at 15 mg/kg according to the ECOG protocol #5508.  3. Chemotherapy with Avastin 15 mg/kg according to the ECOG protocol #5508 status post 32 cycles. Last cycle was given on 06/30/2013 discontinued secondary to disease progression and persistent proteinuria. 4. Tarceva 150 mg by mouth daily, started 08/09/2013, status post 10 months of treatment. 5. Tarceva 100 mg by mouth daily started 07/02/2014, status post 19 months, discontinued secondary to disease progression.   CURRENT THERAPY: Immunotherapy with Nivolumab 240 MG IV every 2 weeks. First dose 02/20/2016  CHEMOTHERAPY INTENT: Palliative.  CURRENT # OF CHEMOTHERAPY CYCLES: 1 CURRENT ANTIEMETICS: Compazine  CURRENT SMOKING STATUS: Non-smoker  ORAL CHEMOTHERAPY AND CONSENT: Tarceva and consent was signed 07/21/2013.  CURRENT BISPHOSPHONATES USE: None  PAIN MANAGEMENT: No pain  NARCOTICS INDUCED CONSTIPATION: No constipation.  LIVING WILL AND CODE STATUS: Full code  INTERVAL HISTORY: [name] 75 y.o. female returns to the clinic today for follow-up visit. The patient is  feeling fine today with no specific complaints except for mild fatigue and intermittent diarrhea. She has been on antibiotics for several weeks secondary to urinary tract infection. She takes Imodium was no significant improvement. The patient has been on Tarceva for the last 29 months and has been tolerating it well except for mild skin rash and few episodes of diarrhea. She denied having any significant chest pain, shortness of breath, cough or hemoptysis. She denied having any weight loss or night sweats. She has no nausea or vomiting. She has no fever or chills. She had repeat CT scan of the chest performed recently and she is here for evaluation and discussion of her scan results.  MEDICAL HISTORY: Past Medical History  Diagnosis Date  . Hypertension   . Hypothyroidism   . Hypercholesterolemia   . Glaucoma   . Hip pain 09/02/2011  . Foot pain 09/02/2011  . Lung cancer ([facility]) 03/08/2011    recurrent  . Bilateral lung cancer ([facility]) 03/08/2011    recurrent    ALLERGIES:  is allergic to cymbalta; fish allergy; amitriptyline; codeine; and sulfa antibiotics.   SURGICAL HISTORY:  Past Surgical History  Procedure Laterality Date  . Video assisted thoracoscopy  09/28/2009  . Thoracotomy  09/28/2009    mini  . Lung lobectomy  09/28/2009    right upper    REVIEW OF SYSTEMS:  Constitutional: positive for fatigue Eyes: negative Ears, nose, mouth, throat, and face: negative Respiratory: negative Cardiovascular: negative Gastrointestinal: positive for diarrhea Genitourinary:negative Integument/breast: negative Hematologic/lymphatic: negative Musculoskeletal:negative Neurological: negative Behavioral/Psych: negative Endocrine: negative Allergic/Immunologic: negative   PHYSICAL EXAMINATION: General appearance: alert, cooperative, fatigued and no distress Head: Normocephalic, without obvious abnormality, atraumatic Neck: no adenopathy, no JVD, supple,  symmetrical, trachea midline and  thyroid not enlarged, symmetric, no tenderness/mass/nodules Lymph nodes: Cervical, supraclavicular, and axillary nodes normal. Resp: clear to auscultation bilaterally Back: symmetric, no curvature. ROM normal. No CVA tenderness. Cardio: regular rate and rhythm, S1, S2 normal, no murmur, click, rub or gallop GI: soft, non-tender; bowel sounds normal; no masses,  no organomegaly Extremities: extremities normal, atraumatic, no cyanosis or edema Neurologic: Alert and oriented X 3, normal strength and tone. Normal symmetric reflexes. Normal coordination and gait  ECOG PERFORMANCE STATUS: 1 - Symptomatic but completely ambulatory  Blood pressure 140/66, pulse 68, temperature 97.8 F (36.6 C), temperature source Oral, resp. rate 18, height '5\' 5"'$  (1.651 m), weight 132 lb 9.6 oz (60.147 kg), SpO2 98 %.  LABORATORY DATA: Lab Results  Component Value Date   WBC 8.0 02/12/2016   HGB 11.6 02/12/2016   HCT 35.7 02/12/2016   MCV 89.1 02/12/2016   PLT 199 02/12/2016      Chemistry      Component Value Date/Time   NA 141 02/12/2016 0941   NA 141 01/11/2015 1603   NA 142 03/08/2011 0754   K 3.8 02/12/2016 0941   K 3.6 01/11/2015 1603   K 4.2 03/08/2011 0754   CL 105 01/11/2015 1603   CL 107 03/30/2013 1036   CL 102 03/08/2011 0754   CO2 24 02/12/2016 0941   CO2 25 05/25/2014 0332   CO2 27 03/08/2011 0754   BUN 11.3 02/12/2016 0941   BUN 10 01/11/2015 1603   BUN 9 03/08/2011 0754   CREATININE 1.3* 02/12/2016 0941   CREATININE 1.20* 01/11/2015 1603   CREATININE 1.0 03/08/2011 0754      Component Value Date/Time   CALCIUM 9.7 02/12/2016 0941   CALCIUM 9.2 05/25/2014 0332   CALCIUM 9.5 03/08/2011 0754   ALKPHOS 74 02/12/2016 0941   ALKPHOS 67 05/25/2014 0332   ALKPHOS 59 03/08/2011 0754   AST 13 02/12/2016 0941   AST 17 05/25/2014 0332   AST 19 03/08/2011 0754   ALT 12 02/12/2016 0941   ALT 9 05/25/2014 0332   ALT 18 03/08/2011 0754   BILITOT 0.81 02/12/2016 0941   BILITOT 0.4  05/25/2014 0332   BILITOT 0.50 03/08/2011 0754       RADIOGRAPHIC STUDIES: Ct Chest Wo Contrast  02/12/2016  CLINICAL DATA:  Lung cancer, Tarceva in progress, chemotherapy complete. Cough for 2 months. EXAM: CT CHEST WITHOUT CONTRAST TECHNIQUE: Multidetector CT imaging of the chest was performed following the standard protocol without IV contrast. COMPARISON:  11/17/2015 and 02/17/2015 FINDINGS: Mediastinum/Nodes: Right IJ Port-A-Cath terminates in right atrium. No pathologically enlarged mediastinal or axillary lymph nodes. Hilar regions are difficult to definitively evaluate without IV contrast. There are calcified left hilar lymph nodes. Atherosclerotic calcification of the arterial vasculature, including three-vessel involvement of the coronary arteries. Heart size normal. No pericardial effusion. Tiny hiatal hernia. Lungs/Pleura: Multiple areas of patchy ground-glass, part solid lesion and solid nodules. Index part solid nodule in the right lower lobe measures 1.9 x 2.5 cm with an internal solid component measuring 11 mm (series 5, image 61). Previously, lesion measured 1.6 x 1.7 cm on 02/17/2015 and without a definitive measurable solid component. Some of this may be due to differences in slice collimation. Part solid nodule in the medial right lower lobe measures 2.0 x 3.1 cm with an internal solid component measuring 2.2 cm, stable from 02/17/2015. Most worrisome is a solid nodule in the left upper lobe which has enlarged from the prior exam of  11/17/2015 and is new from 02/17/2015, measuring 1.3 x 1.6 cm (series 5, image 59), previously 11 mm. Finally, mixed solid and ground-glass mass in the left lower lobe measures 5.0 x 7.4 cm with an internal solid component measuring 3.0 cm (image 68), stable from 02/17/2015. Postoperative changes in the upper right hemi thorax. No pleural fluid. Airway is otherwise unremarkable. Upper abdomen: 7 mm low-attenuation lesion in the dome of the liver is unchanged.  Visualized portions of the liver, adrenal glands unremarkable. Probable renal vascular calcifications bilaterally. Visualized portions of the spleen, pancreas, stomach and bowel are grossly unremarkable with exception of a tiny hiatal hernia. Upper abdominal lymph nodes are not enlarged by CT size criteria. Musculoskeletal: No worrisome lytic or sclerotic lesions. IMPRESSION: 1. Enlarging left upper lobe nodule. 2. Multiple bilateral ground-glass and part solid lesions with an enlarging part solid lesion in the right lower lobe when compared with 02/17/2015. 3. Three-vessel coronary artery calcification. Electronically Signed   By: Lorin Picket M.D.   On: 02/12/2016 11:22    ASSESSMENT AND PLAN: This is a very pleasant 75 years old African-American female with recurrent non-small cell lung cancer, adenocarcinoma presented with multiple pulmonary nodules currently on treatment with Tarceva 100 mg by mouth daily status post more than 19 months. She is tolerating her treatment fairly well with no significant adverse effects except for mild fatigue and few episodes of diarrhea. The recent CT scan of the chest showed some evidence for disease progression with enlarging left upper lobe nodule as well as enlarging multiple bilateral groundglass and semisolid lesions. I personally reviewed the images. I discussed the results with the patient today. I recommended for her to discontinue treatment with Tarceva at this point. I discussed with the patient other treatment options including treatment with immunotherapy with Nivolumab 240 MG IV every 2 weeks. I discussed with the patient adverse effect of this treatment and she was giving handout about Nivolumab. She is expected to start the first dose of this treatment on 02/20/2016. For diarrhea, I will test her stool for C. difficile. For the peripheral neuropathy, the patient will continue vitamin B 12 injection but every 6 weeks. I will see her back for follow-up  visit in 3 weeks for reevaluation and management of any adverse effect of her treatment.  She was advised to call immediately if she has any concerning symptoms in the interval. The patient voices understanding of current disease status and treatment options and is in agreement with the current care plan.  All questions were answered. The patient knows to call the clinic with any problems, questions or concerns. We can certainly see the patient much sooner if necessary.  Disclaimer: This note was dictated with voice recognition software. Similar sounding words can inadvertently be transcribed and may not be corrected upon review.

## 2016-02-14 NOTE — Progress Notes (Signed)
Oncology Nurse Navigator Documentation  Oncology Nurse Navigator Flowsheets 02/14/2016  Navigator Location [facility]  Navigator Encounter Type Clinic/MDC  Patient Visit Type MedOnc  Treatment Phase Treatment  Barriers/Navigation Needs Education;Coordination of Care  Education Other  Interventions Education Method  Education Method Verbal;Written  Acuity Level 2  Acuity Level 2 Educational needs  Time Spent with Patient 30   Spoke with patient today at [facility].  She is having tx change to Opdivo.  I gave and explained information on medication and side effects.    Also, helped her with next steps

## 2016-02-14 NOTE — Telephone Encounter (Signed)
Per staff phone call and POF I have schedueld appts. Scheduler advised of appts and to move labs .  JMW

## 2016-02-15 ENCOUNTER — Ambulatory Visit: Payer: Medicare Other

## 2016-02-15 ENCOUNTER — Other Ambulatory Visit (HOSPITAL_COMMUNITY)
Admission: RE | Admit: 2016-02-15 | Discharge: 2016-02-15 | Disposition: A | Discharge status: Home / Self Care | Payer: Medicare Other | Source: Ambulatory Visit | Attending: Internal Medicine | Admitting: Internal Medicine

## 2016-02-15 DIAGNOSIS — Z5112 Encounter for antineoplastic immunotherapy: Secondary | ICD-10-CM

## 2016-02-15 DIAGNOSIS — C3492 Malignant neoplasm of unspecified part of left bronchus or lung: Principal | ICD-10-CM

## 2016-02-15 DIAGNOSIS — R200 Anesthesia of skin: Secondary | ICD-10-CM

## 2016-02-15 DIAGNOSIS — R197 Diarrhea, unspecified: Secondary | ICD-10-CM

## 2016-02-15 DIAGNOSIS — C3411 Malignant neoplasm of upper lobe, right bronchus or lung: Secondary | ICD-10-CM

## 2016-02-15 DIAGNOSIS — C3491 Malignant neoplasm of unspecified part of right bronchus or lung: Secondary | ICD-10-CM

## 2016-02-15 DIAGNOSIS — E538 Deficiency of other specified B group vitamins: Secondary | ICD-10-CM

## 2016-02-15 DIAGNOSIS — Z95828 Presence of other vascular implants and grafts: Secondary | ICD-10-CM

## 2016-02-15 LAB — C DIFFICILE QUICK SCREEN W PCR REFLEX
C Diff antigen: NEGATIVE
C Diff interpretation: NEGATIVE
C Diff toxin: NEGATIVE

## 2016-02-20 ENCOUNTER — Other Ambulatory Visit (HOSPITAL_BASED_OUTPATIENT_CLINIC_OR_DEPARTMENT_OTHER): Payer: Medicare Other

## 2016-02-20 ENCOUNTER — Ambulatory Visit (HOSPITAL_BASED_OUTPATIENT_CLINIC_OR_DEPARTMENT_OTHER): Payer: Medicare Other

## 2016-02-20 ENCOUNTER — Ambulatory Visit: Payer: Medicare Other

## 2016-02-20 VITALS — BP 160/77 | HR 72 | Temp 96.9°F | Resp 18

## 2016-02-20 DIAGNOSIS — C3411 Malignant neoplasm of upper lobe, right bronchus or lung: Secondary | ICD-10-CM

## 2016-02-20 DIAGNOSIS — R200 Anesthesia of skin: Secondary | ICD-10-CM

## 2016-02-20 DIAGNOSIS — E538 Deficiency of other specified B group vitamins: Secondary | ICD-10-CM

## 2016-02-20 DIAGNOSIS — Z5112 Encounter for antineoplastic immunotherapy: Secondary | ICD-10-CM | POA: Diagnosis not present

## 2016-02-20 DIAGNOSIS — Z79899 Other long term (current) drug therapy: Secondary | ICD-10-CM | POA: Diagnosis not present

## 2016-02-20 DIAGNOSIS — C3492 Malignant neoplasm of unspecified part of left bronchus or lung: Secondary | ICD-10-CM

## 2016-02-20 DIAGNOSIS — C3491 Malignant neoplasm of unspecified part of right bronchus or lung: Secondary | ICD-10-CM | POA: Diagnosis not present

## 2016-02-20 DIAGNOSIS — Z95828 Presence of other vascular implants and grafts: Secondary | ICD-10-CM

## 2016-02-20 DIAGNOSIS — R197 Diarrhea, unspecified: Secondary | ICD-10-CM

## 2016-02-20 LAB — TSH: TSH: 0.674 m[IU]/L (ref 0.308–3.960)

## 2016-02-20 MED ORDER — SODIUM CHLORIDE 0.9 % IV SOLN
240.0000 mg | Freq: Once | INTRAVENOUS | Status: AC
  Administered 2016-02-20: 240 mg via INTRAVENOUS
  Filled 2016-02-20: qty 8

## 2016-02-20 MED ORDER — SODIUM CHLORIDE 0.9 % IV SOLN
Freq: Once | INTRAVENOUS | Status: AC
  Administered 2016-02-20: 13:00:00 via INTRAVENOUS

## 2016-02-20 MED ORDER — SODIUM CHLORIDE 0.9% FLUSH
10.0000 mL | INTRAVENOUS | Status: DC | PRN
  Administered 2016-02-20: 10 mL
  Filled 2016-02-20: qty 10

## 2016-02-20 MED ORDER — HEPARIN SOD (PORK) LOCK FLUSH 100 UNIT/ML IV SOLN
500.0000 [IU] | Freq: Once | INTRAVENOUS | Status: AC | PRN
  Administered 2016-02-20: 500 [IU]
  Filled 2016-02-20: qty 5

## 2016-02-20 NOTE — Patient Instructions (Signed)
[place] Discharge Instructions for Patients Receiving Chemotherapy  Today you received the following chemotherapy agents Nivolumab. To help prevent nausea and vomiting after your treatment, we encourage you to take your nausea medication as directed.   If you develop nausea and vomiting that is not controlled by your nausea medication, call the clinic.   BELOW ARE SYMPTOMS THAT SHOULD BE REPORTED IMMEDIATELY:  *FEVER GREATER THAN 100.5 F  *CHILLS WITH OR WITHOUT FEVER  NAUSEA AND VOMITING THAT IS NOT CONTROLLED WITH YOUR NAUSEA MEDICATION  *UNUSUAL SHORTNESS OF BREATH  *UNUSUAL BRUISING OR BLEEDING  TENDERNESS IN MOUTH AND THROAT WITH OR WITHOUT PRESENCE OF ULCERS  *URINARY PROBLEMS  *BOWEL PROBLEMS  UNUSUAL RASH Items with * indicate a potential emergency and should be followed up as soon as possible.  Feel free to call the clinic you have any questions or concerns. The clinic phone number is [phone].  Please show the [place] at check-in to the Emergency Department and triage nurse.  Nivolumab injection What is this medicine? NIVOLUMAB (nye VOL ue mab) is a monoclonal antibody. It is used to treat melanoma, lung cancer, kidney cancer, and Hodgkin lymphoma. This medicine may be used for other purposes; ask your health care provider or pharmacist if you have questions. What should I tell my health care provider before I take this medicine? They need to know if you have any of these conditions: -diabetes -immune system problems -kidney disease -liver disease -lung disease -organ transplant -stomach or intestine problems -thyroid disease -an unusual or allergic reaction to nivolumab, other medicines, foods, dyes, or preservatives -pregnant or trying to get pregnant -breast-feeding How should I use this medicine? This medicine is for infusion into a vein. It is given by a health care professional in a hospital or clinic setting. A  special MedGuide will be given to you before each treatment. Be sure to read this information carefully each time. Talk to your pediatrician regarding the use of this medicine in children. Special care may be needed. Overdosage: If you think you have taken too much of this medicine contact a poison control center or emergency room at once. NOTE: This medicine is only for you. Do not share this medicine with others. What if I miss a dose? It is important not to miss your dose. Call your doctor or health care professional if you are unable to keep an appointment. What may interact with this medicine? Interactions have not been studied. Give your health care provider a list of all the medicines, herbs, non-prescription drugs, or dietary supplements you use. Also tell them if you smoke, drink alcohol, or use illegal drugs. Some items may interact with your medicine. This list may not describe all possible interactions. Give your health care provider a list of all the medicines, herbs, non-prescription drugs, or dietary supplements you use. Also tell them if you smoke, drink alcohol, or use illegal drugs. Some items may interact with your medicine. What should I watch for while using this medicine? This drug may make you feel generally unwell. Continue your course of treatment even though you feel ill unless your doctor tells you to stop. You may need blood work done while you are taking this medicine. Do not become pregnant while taking this medicine or for 5 months after stopping it. Women should inform their doctor if they wish to become pregnant or think they might be pregnant. There is a potential for serious side effects to an unborn child.  Talk to your health care professional or pharmacist for more information. Do not breast-feed an infant while taking this medicine. What side effects may I notice from receiving this medicine? Side effects that you should report to your doctor or health care  professional as soon as possible: -allergic reactions like skin rash, itching or hives, swelling of the face, lips, or tongue -black, tarry stools -blood in the urine -bloody or watery diarrhea -changes in vision -change in sex drive -changes in emotions or moods -chest pain -confusion -cough -decreased appetite -diarrhea -facial flushing -feeling faint or lightheaded -fever, chills -hair loss -hallucination, loss of contact with reality -headache -irritable -joint pain -loss of memory -muscle pain -muscle weakness -seizures -shortness of breath -signs and symptoms of high blood sugar such as dizziness; dry mouth; dry skin; fruity breath; nausea; stomach pain; increased hunger or thirst; increased urination -signs and symptoms of kidney injury like trouble passing urine or change in the amount of urine -signs and symptoms of liver injury like dark yellow or brown urine; general ill feeling or flu-like symptoms; light-colored stools; loss of appetite; nausea; right upper belly pain; unusually weak or tired; yellowing of the eyes or skin -stiff neck -swelling of the ankles, feet, hands -weight gain Side effects that usually do not require medical attention (report to your doctor or health care professional if they continue or are bothersome): -bone pain -constipation -tiredness -vomiting This list may not describe all possible side effects. Call your doctor for medical advice about side effects. You may report side effects to FDA at 1-800-FDA-1088. Where should I keep my medicine? This drug is given in a hospital or clinic and will not be stored at home. NOTE: This sheet is a summary. It may not cover all possible information. If you have questions about this medicine, talk to your doctor, pharmacist, or health care provider.    2016, Elsevier/Gold Standard. (2015-03-01 10:03:42)

## 2016-02-21 ENCOUNTER — Telehealth: Payer: Self-pay

## 2016-02-21 NOTE — Telephone Encounter (Signed)
Called [facility] for chemotherapy F/U.  Patient is doing well.  Denies n/v.  Denies any new side effects or symptoms.  Bowel and bladder is functioning well.  Eating and drinking well and I instructed to drink 64 oz minimum daily or at least the day before, of and after treatment.  Denies questions at this time and encouraged to call if needed.  Reviewed how to call after hours in the case of an emergency.

## 2016-02-21 NOTE — Telephone Encounter (Signed)
-----   Message from Flo Shanks, RN sent at 02/20/2016  4:17 PM EDT ----- Regarding: chemo followup call/ Dr. Julien Nordmann Ist time [facility]

## 2016-02-27 DIAGNOSIS — E039 Hypothyroidism, unspecified: Secondary | ICD-10-CM | POA: Diagnosis not present

## 2016-02-27 DIAGNOSIS — K521 Toxic gastroenteritis and colitis: Secondary | ICD-10-CM | POA: Diagnosis not present

## 2016-02-27 DIAGNOSIS — E559 Vitamin D deficiency, unspecified: Secondary | ICD-10-CM | POA: Diagnosis not present

## 2016-02-27 DIAGNOSIS — I10 Essential (primary) hypertension: Secondary | ICD-10-CM | POA: Diagnosis not present

## 2016-02-27 DIAGNOSIS — I6529 Occlusion and stenosis of unspecified carotid artery: Secondary | ICD-10-CM | POA: Diagnosis not present

## 2016-02-28 DIAGNOSIS — N3020 Other chronic cystitis without hematuria: Secondary | ICD-10-CM | POA: Diagnosis not present

## 2016-02-28 DIAGNOSIS — Z0000 Encounter for general adult medical examination without abnormal findings: Secondary | ICD-10-CM | POA: Diagnosis not present

## 2016-02-28 DIAGNOSIS — R350 Frequency of micturition: Secondary | ICD-10-CM | POA: Diagnosis not present

## 2016-03-05 ENCOUNTER — Other Ambulatory Visit: Payer: Self-pay | Admitting: Medical Oncology

## 2016-03-05 ENCOUNTER — Ambulatory Visit (HOSPITAL_BASED_OUTPATIENT_CLINIC_OR_DEPARTMENT_OTHER): Payer: Medicare Other | Admitting: Internal Medicine

## 2016-03-05 ENCOUNTER — Other Ambulatory Visit (HOSPITAL_BASED_OUTPATIENT_CLINIC_OR_DEPARTMENT_OTHER): Payer: Medicare Other

## 2016-03-05 ENCOUNTER — Encounter: Payer: Self-pay | Admitting: Internal Medicine

## 2016-03-05 ENCOUNTER — Encounter: Payer: Self-pay | Admitting: *Deleted

## 2016-03-05 ENCOUNTER — Ambulatory Visit (HOSPITAL_BASED_OUTPATIENT_CLINIC_OR_DEPARTMENT_OTHER): Payer: Medicare Other

## 2016-03-05 VITALS — BP 141/70 | HR 75 | Temp 98.1°F | Resp 18 | Ht 65.0 in | Wt 132.6 lb

## 2016-03-05 DIAGNOSIS — Z95828 Presence of other vascular implants and grafts: Secondary | ICD-10-CM

## 2016-03-05 DIAGNOSIS — G620 Drug-induced polyneuropathy: Secondary | ICD-10-CM | POA: Diagnosis not present

## 2016-03-05 DIAGNOSIS — C3491 Malignant neoplasm of unspecified part of right bronchus or lung: Secondary | ICD-10-CM

## 2016-03-05 DIAGNOSIS — C3492 Malignant neoplasm of unspecified part of left bronchus or lung: Secondary | ICD-10-CM

## 2016-03-05 DIAGNOSIS — Z5112 Encounter for antineoplastic immunotherapy: Secondary | ICD-10-CM | POA: Diagnosis not present

## 2016-03-05 LAB — COMPREHENSIVE METABOLIC PANEL
ALBUMIN: 3.7 g/dL (ref 3.5–5.0)
ALK PHOS: 72 U/L (ref 40–150)
ALT: 15 U/L (ref 0–55)
AST: 14 U/L (ref 5–34)
Anion Gap: 9 mEq/L (ref 3–11)
BUN: 10.8 mg/dL (ref 7.0–26.0)
CO2: 25 mEq/L (ref 22–29)
Calcium: 9.8 mg/dL (ref 8.4–10.4)
Chloride: 107 mEq/L (ref 98–109)
Creatinine: 1.2 mg/dL — ABNORMAL HIGH (ref 0.6–1.1)
EGFR: 49 mL/min/{1.73_m2} — ABNORMAL LOW (ref >90)
GLUCOSE: 81 mg/dL (ref 70–140)
POTASSIUM: 3.9 meq/L (ref 3.5–5.1)
SODIUM: 141 meq/L (ref 136–145)
TOTAL PROTEIN: 7 g/dL (ref 6.4–8.3)
Total Bilirubin: 0.41 mg/dL (ref 0.20–1.20)

## 2016-03-05 LAB — CBC WITH DIFFERENTIAL/PLATELET
BASO%: 0.7 % (ref 0.0–2.0)
Basophils Absolute: 0.1 10*3/uL (ref 0.0–0.1)
EOS ABS: 0.2 10*3/uL (ref 0.0–0.5)
EOS%: 2.9 % (ref 0.0–7.0)
HCT: 34.8 % (ref 34.8–46.6)
HEMOGLOBIN: 11.4 g/dL — AB (ref 11.6–15.9)
LYMPH%: 23 % (ref 14.0–49.7)
MCH: 29.1 pg (ref 25.1–34.0)
MCHC: 32.8 g/dL (ref 31.5–36.0)
MCV: 88.8 fL (ref 79.5–101.0)
MONO#: 0.5 10*3/uL (ref 0.1–0.9)
MONO%: 6.7 % (ref 0.0–14.0)
NEUT%: 66.7 % (ref 38.4–76.8)
NEUTROS ABS: 4.9 10*3/uL (ref 1.5–6.5)
Platelets: 234 10*3/uL (ref 145–400)
RBC: 3.92 10*6/uL (ref 3.70–5.45)
RDW: 14.2 % (ref 11.2–14.5)
WBC: 7.3 10*3/uL (ref 3.9–10.3)
lymph#: 1.7 10*3/uL (ref 0.9–3.3)

## 2016-03-05 MED ORDER — SODIUM CHLORIDE 0.9 % IV SOLN
Freq: Once | INTRAVENOUS | Status: AC
Start: 2016-03-05 — End: 2016-03-05
  Administered 2016-03-05: 11:00:00 via INTRAVENOUS

## 2016-03-05 MED ORDER — HEPARIN SOD (PORK) LOCK FLUSH 100 UNIT/ML IV SOLN
500.0000 [IU] | Freq: Once | INTRAVENOUS | Status: AC | PRN
Start: 2016-03-05 — End: 2016-03-05
  Administered 2016-03-05: 500 [IU]
  Filled 2016-03-05: qty 5

## 2016-03-05 MED ORDER — SODIUM CHLORIDE 0.9 % IV SOLN
240.0000 mg | Freq: Once | INTRAVENOUS | Status: AC
  Administered 2016-03-05: 240 mg via INTRAVENOUS
  Filled 2016-03-05: qty 8

## 2016-03-05 MED ORDER — LIDOCAINE-PRILOCAINE 2.5-2.5 % EX CREA: 1.0000 "application " | TOPICAL_CREAM | CUTANEOUS | Status: AC | PRN

## 2016-03-05 MED ORDER — SODIUM CHLORIDE 0.9% FLUSH
10.0000 mL | INTRAVENOUS | Status: DC | PRN
  Administered 2016-03-05: 10 mL
  Filled 2016-03-05: qty 10

## 2016-03-05 NOTE — Patient Instructions (Signed)
[place] Discharge Instructions for Patients Receiving Chemotherapy  Today you received the following chemotherapy agents Nivolumab. To help prevent nausea and vomiting after your treatment, we encourage you to take your nausea medication as directed.   If you develop nausea and vomiting that is not controlled by your nausea medication, call the clinic.   BELOW ARE SYMPTOMS THAT SHOULD BE REPORTED IMMEDIATELY:  *FEVER GREATER THAN 100.5 F  *CHILLS WITH OR WITHOUT FEVER  NAUSEA AND VOMITING THAT IS NOT CONTROLLED WITH YOUR NAUSEA MEDICATION  *UNUSUAL SHORTNESS OF BREATH  *UNUSUAL BRUISING OR BLEEDING  TENDERNESS IN MOUTH AND THROAT WITH OR WITHOUT PRESENCE OF ULCERS  *URINARY PROBLEMS  *BOWEL PROBLEMS  UNUSUAL RASH Items with * indicate a potential emergency and should be followed up as soon as possible.  Feel free to call the clinic you have any questions or concerns. The clinic phone number is [phone].  Please show the [place] at check-in to the Emergency Department and triage nurse.  Nivolumab injection What is this medicine? NIVOLUMAB (nye VOL ue mab) is a monoclonal antibody. It is used to treat melanoma, lung cancer, kidney cancer, and Hodgkin lymphoma. This medicine may be used for other purposes; ask your health care provider or pharmacist if you have questions. What should I tell my health care provider before I take this medicine? They need to know if you have any of these conditions: -diabetes -immune system problems -kidney disease -liver disease -lung disease -organ transplant -stomach or intestine problems -thyroid disease -an unusual or allergic reaction to nivolumab, other medicines, foods, dyes, or preservatives -pregnant or trying to get pregnant -breast-feeding How should I use this medicine? This medicine is for infusion into a vein. It is given by a health care professional in a hospital or clinic setting. A  special MedGuide will be given to you before each treatment. Be sure to read this information carefully each time. Talk to your pediatrician regarding the use of this medicine in children. Special care may be needed. Overdosage: If you think you have taken too much of this medicine contact a poison control center or emergency room at once. NOTE: This medicine is only for you. Do not share this medicine with others. What if I miss a dose? It is important not to miss your dose. Call your doctor or health care professional if you are unable to keep an appointment. What may interact with this medicine? Interactions have not been studied. Give your health care provider a list of all the medicines, herbs, non-prescription drugs, or dietary supplements you use. Also tell them if you smoke, drink alcohol, or use illegal drugs. Some items may interact with your medicine. This list may not describe all possible interactions. Give your health care provider a list of all the medicines, herbs, non-prescription drugs, or dietary supplements you use. Also tell them if you smoke, drink alcohol, or use illegal drugs. Some items may interact with your medicine. What should I watch for while using this medicine? This drug may make you feel generally unwell. Continue your course of treatment even though you feel ill unless your doctor tells you to stop. You may need blood work done while you are taking this medicine. Do not become pregnant while taking this medicine or for 5 months after stopping it. Women should inform their doctor if they wish to become pregnant or think they might be pregnant. There is a potential for serious side effects to an unborn child.  Talk to your health care professional or pharmacist for more information. Do not breast-feed an infant while taking this medicine. What side effects may I notice from receiving this medicine? Side effects that you should report to your doctor or health care  professional as soon as possible: -allergic reactions like skin rash, itching or hives, swelling of the face, lips, or tongue -black, tarry stools -blood in the urine -bloody or watery diarrhea -changes in vision -change in sex drive -changes in emotions or moods -chest pain -confusion -cough -decreased appetite -diarrhea -facial flushing -feeling faint or lightheaded -fever, chills -hair loss -hallucination, loss of contact with reality -headache -irritable -joint pain -loss of memory -muscle pain -muscle weakness -seizures -shortness of breath -signs and symptoms of high blood sugar such as dizziness; dry mouth; dry skin; fruity breath; nausea; stomach pain; increased hunger or thirst; increased urination -signs and symptoms of kidney injury like trouble passing urine or change in the amount of urine -signs and symptoms of liver injury like dark yellow or brown urine; general ill feeling or flu-like symptoms; light-colored stools; loss of appetite; nausea; right upper belly pain; unusually weak or tired; yellowing of the eyes or skin -stiff neck -swelling of the ankles, feet, hands -weight gain Side effects that usually do not require medical attention (report to your doctor or health care professional if they continue or are bothersome): -bone pain -constipation -tiredness -vomiting This list may not describe all possible side effects. Call your doctor for medical advice about side effects. You may report side effects to FDA at 1-800-FDA-1088. Where should I keep my medicine? This drug is given in a hospital or clinic and will not be stored at home. NOTE: This sheet is a summary. It may not cover all possible information. If you have questions about this medicine, talk to your doctor, pharmacist, or health care provider.    2016, Elsevier/Gold Standard. (2015-03-01 10:03:42)

## 2016-03-05 NOTE — Progress Notes (Signed)
[facility] [phone]   [phone]  OFFICE PROGRESS NOTE  [name], NP [phone] [address]  DIAGNOSIS AND STAGE:  #1 Recurrent non-small cell lung cancer consistent with adenocarcinoma. This was initially diagnosed as stage IB (T2a N0 M0) well-differentiated adenocarcinoma of bronchioalveolar type in December 2010.  #2 Acute pulmonary embolism in the right lower lobe lobar, segmental and subsegmental sized pulmonary arteries, diagnosed on 11/04/2011.   PRIOR THERAPY:  1.Status post right upper lobectomy on September 28, 2009 under the care of Dr. Arlyce Dice. The patient refused adjuvant chemotherapy at that time. She had disease recurrence in June of 2012.  2.Status post 4 cycles of systemic chemotherapy with carboplatin for an AUC of 6 and paclitaxel at 200 mg per meter squared and Avastin at 15 mg/kg according to the ECOG protocol #5508.  3. Chemotherapy with Avastin 15 mg/kg according to the ECOG protocol #5508 status post 32 cycles. Last cycle was given on 06/30/2013 discontinued secondary to disease progression and persistent proteinuria. 4. Tarceva 150 mg by mouth daily, started 08/09/2013, status post 10 months of treatment. 5. Tarceva 100 mg by mouth daily started 07/02/2014, status post 19 months, discontinued secondary to disease progression.   CURRENT THERAPY: Immunotherapy with Nivolumab 240 MG IV every 2 weeks. First dose 02/20/2016. Status post one cycle.  CHEMOTHERAPY INTENT: Palliative.  CURRENT # OF CHEMOTHERAPY CYCLES: 2 CURRENT ANTIEMETICS: Compazine  CURRENT SMOKING STATUS: Non-smoker  ORAL CHEMOTHERAPY AND CONSENT: Tarceva and consent was signed 07/21/2013.  CURRENT BISPHOSPHONATES USE: None  PAIN MANAGEMENT: No pain  NARCOTICS INDUCED CONSTIPATION: No constipation.  LIVING WILL AND CODE STATUS: Full code  INTERVAL HISTORY: [name] 75 y.o. female returns to the clinic today for follow-up  visit. The patient is feeling fine today with no specific complaints except for mild fatigue. She tolerated the first cycle of her treatment with immunotherapy with Nivolumab fairly well. She denied having any significant chest pain, shortness of breath, cough or hemoptysis. She denied having any weight loss or night sweats. She has no nausea or vomiting. She has no fever or chills. She is here today to start cycle #2.  MEDICAL HISTORY: Past Medical History  Diagnosis Date  . Hypertension   . Hypothyroidism   . Hypercholesterolemia   . Glaucoma   . Hip pain 09/02/2011  . Foot pain 09/02/2011  . Lung cancer ([facility]) 03/08/2011    recurrent  . 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 Encounter for antineoplastic immunotherapy 02/14/2016  . Chronic fatigue 02/14/2016    ALLERGIES:  is allergic to cymbalta; fish allergy; amitriptyline; codeine; and sulfa antibiotics.   SURGICAL HISTORY:  Past Surgical History  Procedure Laterality Date  . Video assisted thoracoscopy  09/28/2009  . Thoracotomy  09/28/2009    mini  . Lung lobectomy  09/28/2009    right upper    REVIEW OF SYSTEMS:  A comprehensive review of systems was negative except for: Constitutional: positive for fatigue   PHYSICAL EXAMINATION: General appearance: alert, cooperative, fatigued and no distress Head: Normocephalic, without obvious abnormality, atraumatic Neck: no adenopathy, no JVD, supple, symmetrical, trachea midline and thyroid not enlarged, symmetric, no tenderness/mass/nodules Lymph nodes: Cervical, supraclavicular, and axillary nodes normal. Resp: clear to auscultation bilaterally Back: symmetric, no curvature. ROM normal. No CVA tenderness. Cardio: regular rate and rhythm, S1, S2 normal, no murmur, click, rub or gallop GI: soft, non-tender; bowel sounds normal; no masses,  no  organomegaly Extremities: extremities normal, atraumatic, no cyanosis or edema Neurologic: Alert and oriented X 3, normal  strength and tone. Normal symmetric reflexes. Normal coordination and gait  ECOG PERFORMANCE STATUS: 1 - Symptomatic but completely ambulatory  Blood pressure 141/70, pulse 75, temperature 98.1 F (36.7 C), temperature source Oral, resp. rate 18, height '5\' 5"'$  (1.651 m), weight 132 lb 9.6 oz (60.147 kg), SpO2 100 %.  LABORATORY DATA: Lab Results  Component Value Date   WBC 7.3 03/05/2016   HGB 11.4* 03/05/2016   HCT 34.8 03/05/2016   MCV 88.8 03/05/2016   PLT 234 03/05/2016      Chemistry      Component Value Date/Time   NA 141 03/05/2016 0930   NA 141 01/11/2015 1603   NA 142 03/08/2011 0754   K 3.9 03/05/2016 0930   K 3.6 01/11/2015 1603   K 4.2 03/08/2011 0754   CL 105 01/11/2015 1603   CL 107 03/30/2013 1036   CL 102 03/08/2011 0754   CO2 25 03/05/2016 0930   CO2 25 05/25/2014 0332   CO2 27 03/08/2011 0754   BUN 10.8 03/05/2016 0930   BUN 10 01/11/2015 1603   BUN 9 03/08/2011 0754   CREATININE 1.2* 03/05/2016 0930   CREATININE 1.20* 01/11/2015 1603   CREATININE 1.0 03/08/2011 0754      Component Value Date/Time   CALCIUM 9.8 03/05/2016 0930   CALCIUM 9.2 05/25/2014 0332   CALCIUM 9.5 03/08/2011 0754   ALKPHOS 72 03/05/2016 0930   ALKPHOS 67 05/25/2014 0332   ALKPHOS 59 03/08/2011 0754   AST 14 03/05/2016 0930   AST 17 05/25/2014 0332   AST 19 03/08/2011 0754   ALT 15 03/05/2016 0930   ALT 9 05/25/2014 0332   ALT 18 03/08/2011 0754   BILITOT 0.41 03/05/2016 0930   BILITOT 0.4 05/25/2014 0332   BILITOT 0.50 03/08/2011 0754       RADIOGRAPHIC STUDIES: Ct Chest Wo Contrast  02/12/2016  CLINICAL DATA:  Lung cancer, Tarceva in progress, chemotherapy complete. Cough for 2 months. EXAM: CT CHEST WITHOUT CONTRAST TECHNIQUE: Multidetector CT imaging of the chest was performed following the standard protocol without IV contrast. COMPARISON:  11/17/2015 and 02/17/2015 FINDINGS: Mediastinum/Nodes: Right IJ Port-A-Cath terminates in right atrium. No pathologically  enlarged mediastinal or axillary lymph nodes. Hilar regions are difficult to definitively evaluate without IV contrast. There are calcified left hilar lymph nodes. Atherosclerotic calcification of the arterial vasculature, including three-vessel involvement of the coronary arteries. Heart size normal. No pericardial effusion. Tiny hiatal hernia. Lungs/Pleura: Multiple areas of patchy ground-glass, part solid lesion and solid nodules. Index part solid nodule in the right lower lobe measures 1.9 x 2.5 cm with an internal solid component measuring 11 mm (series 5, image 61). Previously, lesion measured 1.6 x 1.7 cm on 02/17/2015 and without a definitive measurable solid component. Some of this may be due to differences in slice collimation. Part solid nodule in the medial right lower lobe measures 2.0 x 3.1 cm with an internal solid component measuring 2.2 cm, stable from 02/17/2015. Most worrisome is a solid nodule in the left upper lobe which has enlarged from the prior exam of 11/17/2015 and is new from 02/17/2015, measuring 1.3 x 1.6 cm (series 5, image 59), previously 11 mm. Finally, mixed solid and ground-glass mass in the left lower lobe measures 5.0 x 7.4 cm with an internal solid component measuring 3.0 cm (image 68), stable from 02/17/2015. Postoperative changes in the upper right hemi thorax. No  pleural fluid. Airway is otherwise unremarkable. Upper abdomen: 7 mm low-attenuation lesion in the dome of the liver is unchanged. Visualized portions of the liver, adrenal glands unremarkable. Probable renal vascular calcifications bilaterally. Visualized portions of the spleen, pancreas, stomach and bowel are grossly unremarkable with exception of a tiny hiatal hernia. Upper abdominal lymph nodes are not enlarged by CT size criteria. Musculoskeletal: No worrisome lytic or sclerotic lesions. IMPRESSION: 1. Enlarging left upper lobe nodule. 2. Multiple bilateral ground-glass and part solid lesions with an enlarging  part solid lesion in the right lower lobe when compared with 02/17/2015. 3. Three-vessel coronary artery calcification. Electronically Signed   By: Lorin Picket M.D.   On: 02/12/2016 11:22    ASSESSMENT AND PLAN: This is a very pleasant 75 years old African-American female with recurrent non-small cell lung cancer, adenocarcinoma presented with multiple pulmonary nodules currently on treatment with Tarceva 100 mg by mouth daily status post more than 19 months. She is tolerating her treatment fairly well with no significant adverse effects except for mild fatigue and few episodes of diarrhea. The recent CT scan of the chest showed some evidence for disease progression with enlarging left upper lobe nodule as well as enlarging multiple bilateral groundglass and semisolid lesions. I personally reviewed the images. I discussed the results with the patient today. I recommended for her to discontinue treatment with Tarceva at this point.  She is currently on treatment with immunotherapy with Nivolumab status post 1 cycle. I recommended for the patient to proceed with cycle #2 today as scheduled. For the peripheral neuropathy, the patient will continue vitamin B 12 injection but every 6 weeks. I will see her back for follow-up visit in 2 weeks for reevaluation and management of any adverse effect of her treatment before starting cycle #3.  She was advised to call immediately if she has any concerning symptoms in the interval. The patient voices understanding of current disease status and treatment options and is in agreement with the current care plan.  All questions were answered. The patient knows to call the clinic with any problems, questions or concerns. We can certainly see the patient much sooner if necessary.  Disclaimer: This note was dictated with voice recognition software. Similar sounding words can inadvertently be transcribed and may not be corrected upon review.

## 2016-03-05 NOTE — Progress Notes (Signed)
Oncology Nurse Navigator Documentation  Oncology Nurse Navigator Flowsheets 03/05/2016  Navigator Location [facility]  Navigator Encounter Type Clinic/[facility]  Treatment Phase Treatment  Barriers/Navigation Needs No barriers at this time  Interventions None required  Acuity Level 1  Time Spent with Patient 15   I spoke with Ms. Dray today before her tx. I wanted to follow up after her first cycle of IO therapy.  She is tolerating tx well without complaints.  No barriers identified at this time.

## 2016-03-18 ENCOUNTER — Other Ambulatory Visit: Payer: Self-pay | Admitting: Medical Oncology

## 2016-03-18 DIAGNOSIS — C3492 Malignant neoplasm of unspecified part of left bronchus or lung: Principal | ICD-10-CM

## 2016-03-18 DIAGNOSIS — C3491 Malignant neoplasm of unspecified part of right bronchus or lung: Secondary | ICD-10-CM

## 2016-03-19 ENCOUNTER — Ambulatory Visit: Payer: Medicare Other

## 2016-03-19 ENCOUNTER — Other Ambulatory Visit (HOSPITAL_BASED_OUTPATIENT_CLINIC_OR_DEPARTMENT_OTHER): Payer: Medicare Other

## 2016-03-19 ENCOUNTER — Ambulatory Visit (HOSPITAL_BASED_OUTPATIENT_CLINIC_OR_DEPARTMENT_OTHER): Payer: Medicare Other

## 2016-03-19 ENCOUNTER — Encounter: Payer: Self-pay | Admitting: Internal Medicine

## 2016-03-19 ENCOUNTER — Telehealth: Payer: Self-pay | Admitting: Internal Medicine

## 2016-03-19 ENCOUNTER — Ambulatory Visit (HOSPITAL_BASED_OUTPATIENT_CLINIC_OR_DEPARTMENT_OTHER): Payer: Medicare Other | Admitting: Internal Medicine

## 2016-03-19 VITALS — BP 150/56 | HR 73 | Temp 97.7°F | Resp 19 | Ht 65.0 in | Wt 133.7 lb

## 2016-03-19 DIAGNOSIS — R197 Diarrhea, unspecified: Secondary | ICD-10-CM

## 2016-03-19 DIAGNOSIS — C3492 Malignant neoplasm of unspecified part of left bronchus or lung: Secondary | ICD-10-CM

## 2016-03-19 DIAGNOSIS — C3491 Malignant neoplasm of unspecified part of right bronchus or lung: Secondary | ICD-10-CM

## 2016-03-19 DIAGNOSIS — E538 Deficiency of other specified B group vitamins: Secondary | ICD-10-CM

## 2016-03-19 DIAGNOSIS — G620 Drug-induced polyneuropathy: Secondary | ICD-10-CM | POA: Diagnosis not present

## 2016-03-19 DIAGNOSIS — Z79899 Other long term (current) drug therapy: Secondary | ICD-10-CM | POA: Diagnosis not present

## 2016-03-19 DIAGNOSIS — R200 Anesthesia of skin: Secondary | ICD-10-CM

## 2016-03-19 DIAGNOSIS — Z5112 Encounter for antineoplastic immunotherapy: Secondary | ICD-10-CM | POA: Diagnosis not present

## 2016-03-19 DIAGNOSIS — Z95828 Presence of other vascular implants and grafts: Secondary | ICD-10-CM

## 2016-03-19 DIAGNOSIS — C3411 Malignant neoplasm of upper lobe, right bronchus or lung: Secondary | ICD-10-CM

## 2016-03-19 LAB — COMPREHENSIVE METABOLIC PANEL
ALT: 20 U/L (ref 0–55)
AST: 16 U/L (ref 5–34)
Albumin: 3.9 g/dL (ref 3.5–5.0)
Alkaline Phosphatase: 87 U/L (ref 40–150)
Anion Gap: 7 mEq/L (ref 3–11)
BUN: 14.1 mg/dL (ref 7.0–26.0)
CALCIUM: 9.8 mg/dL (ref 8.4–10.4)
CHLORIDE: 108 meq/L (ref 98–109)
CO2: 26 mEq/L (ref 22–29)
Creatinine: 1.2 mg/dL — ABNORMAL HIGH (ref 0.6–1.1)
EGFR: 49 mL/min/{1.73_m2} — AB (ref >90)
GLUCOSE: 77 mg/dL (ref 70–140)
POTASSIUM: 4 meq/L (ref 3.5–5.1)
SODIUM: 141 meq/L (ref 136–145)
Total Bilirubin: 0.34 mg/dL (ref 0.20–1.20)
Total Protein: 7.4 g/dL (ref 6.4–8.3)

## 2016-03-19 LAB — CBC WITH DIFFERENTIAL/PLATELET
BASO%: 0.5 % (ref 0.0–2.0)
BASOS ABS: 0 10*3/uL (ref 0.0–0.1)
EOS ABS: 0.3 10*3/uL (ref 0.0–0.5)
EOS%: 3.6 % (ref 0.0–7.0)
HCT: 36.7 % (ref 34.8–46.6)
HGB: 12 g/dL (ref 11.6–15.9)
LYMPH#: 1.7 10*3/uL (ref 0.9–3.3)
LYMPH%: 21.9 % (ref 14.0–49.7)
MCH: 29.1 pg (ref 25.1–34.0)
MCHC: 32.7 g/dL (ref 31.5–36.0)
MCV: 89.1 fL (ref 79.5–101.0)
MONO#: 0.6 10*3/uL (ref 0.1–0.9)
MONO%: 7.4 % (ref 0.0–14.0)
NEUT#: 5.1 10*3/uL (ref 1.5–6.5)
NEUT%: 66.6 % (ref 38.4–76.8)
Platelets: 224 10*3/uL (ref 145–400)
RBC: 4.12 10*6/uL (ref 3.70–5.45)
RDW: 13.9 % (ref 11.2–14.5)
WBC: 7.7 10*3/uL (ref 3.9–10.3)

## 2016-03-19 LAB — TSH: TSH: 0.242 m(IU)/L — ABNORMAL LOW (ref 0.308–3.960)

## 2016-03-19 MED ORDER — HEPARIN SOD (PORK) LOCK FLUSH 100 UNIT/ML IV SOLN
500.0000 [IU] | Freq: Once | INTRAVENOUS | Status: AC | PRN
  Administered 2016-03-19: 500 [IU]
  Filled 2016-03-19: qty 5

## 2016-03-19 MED ORDER — CYANOCOBALAMIN 1000 MCG/ML IJ SOLN
1000.0000 ug | Freq: Once | INTRAMUSCULAR | Status: AC
  Administered 2016-03-19: 1000 ug via INTRAMUSCULAR

## 2016-03-19 MED ORDER — CYANOCOBALAMIN 1000 MCG/ML IJ SOLN
INTRAMUSCULAR | Status: AC
  Filled 2016-03-19: qty 1

## 2016-03-19 MED ORDER — SODIUM CHLORIDE 0.9 % IV SOLN
240.0000 mg | Freq: Once | INTRAVENOUS | Status: AC
  Administered 2016-03-19: 240 mg via INTRAVENOUS
  Filled 2016-03-19: qty 8

## 2016-03-19 MED ORDER — SODIUM CHLORIDE 0.9% FLUSH
10.0000 mL | INTRAVENOUS | Status: DC | PRN
  Administered 2016-03-19: 10 mL
  Filled 2016-03-19: qty 10

## 2016-03-19 MED ORDER — SODIUM CHLORIDE 0.9 % IV SOLN
Freq: Once | INTRAVENOUS | Status: AC
  Administered 2016-03-19: 11:00:00 via INTRAVENOUS

## 2016-03-19 NOTE — Telephone Encounter (Signed)
Gave and prnted appt sched and avs for pt for June thru Aug

## 2016-03-19 NOTE — Patient Instructions (Signed)
[facility] Discharge Instructions for Patients Receiving Chemotherapy  Today you received the following chemotherapy agents Nivolumab. To help prevent nausea and vomiting after your treatment, we encourage you to take your nausea medication as directed.   If you develop nausea and vomiting that is not controlled by your nausea medication, call the clinic.   BELOW ARE SYMPTOMS THAT SHOULD BE REPORTED IMMEDIATELY:  *FEVER GREATER THAN 100.5 F  *CHILLS WITH OR WITHOUT FEVER  NAUSEA AND VOMITING THAT IS NOT CONTROLLED WITH YOUR NAUSEA MEDICATION  *UNUSUAL SHORTNESS OF BREATH  *UNUSUAL BRUISING OR BLEEDING  TENDERNESS IN MOUTH AND THROAT WITH OR WITHOUT PRESENCE OF ULCERS  *URINARY PROBLEMS  *BOWEL PROBLEMS  UNUSUAL RASH Items with * indicate a potential emergency and should be followed up as soon as possible.  Feel free to call the clinic you have any questions or concerns. The clinic phone number is [phone].  Please show the [facility] at check-in to the Emergency Department and triage nurse.  Nivolumab injection What is this medicine? NIVOLUMAB (nye VOL ue mab) is a monoclonal antibody. It is used to treat melanoma, lung cancer, kidney cancer, and Hodgkin lymphoma. This medicine may be used for other purposes; ask your health care provider or pharmacist if you have questions. What should I tell my health care provider before I take this medicine? They need to know if you have any of these conditions: -diabetes -immune system problems -kidney disease -liver disease -lung disease -organ transplant -stomach or intestine problems -thyroid disease -an unusual or allergic reaction to nivolumab, other medicines, foods, dyes, or preservatives -pregnant or trying to get pregnant -breast-feeding How should I use this medicine? This medicine is for infusion into a vein. It is given by a health care professional in a hospital or clinic setting. A  special MedGuide will be given to you before each treatment. Be sure to read this information carefully each time. Talk to your pediatrician regarding the use of this medicine in children. Special care may be needed. Overdosage: If you think you have taken too much of this medicine contact a poison control center or emergency room at once. NOTE: This medicine is only for you. Do not share this medicine with others. What if I miss a dose? It is important not to miss your dose. Call your doctor or health care professional if you are unable to keep an appointment. What may interact with this medicine? Interactions have not been studied. Give your health care provider a list of all the medicines, herbs, non-prescription drugs, or dietary supplements you use. Also tell them if you smoke, drink alcohol, or use illegal drugs. Some items may interact with your medicine. This list may not describe all possible interactions. Give your health care provider a list of all the medicines, herbs, non-prescription drugs, or dietary supplements you use. Also tell them if you smoke, drink alcohol, or use illegal drugs. Some items may interact with your medicine. What should I watch for while using this medicine? This drug may make you feel generally unwell. Continue your course of treatment even though you feel ill unless your doctor tells you to stop. You may need blood work done while you are taking this medicine. Do not become pregnant while taking this medicine or for 5 months after stopping it. Women should inform their doctor if they wish to become pregnant or think they might be pregnant. There is a potential for serious side effects to an unborn child.  Talk to your health care professional or pharmacist for more information. Do not breast-feed an infant while taking this medicine. What side effects may I notice from receiving this medicine? Side effects that you should report to your doctor or health care  professional as soon as possible: -allergic reactions like skin rash, itching or hives, swelling of the face, lips, or tongue -black, tarry stools -blood in the urine -bloody or watery diarrhea -changes in vision -change in sex drive -changes in emotions or moods -chest pain -confusion -cough -decreased appetite -diarrhea -facial flushing -feeling faint or lightheaded -fever, chills -hair loss -hallucination, loss of contact with reality -headache -irritable -joint pain -loss of memory -muscle pain -muscle weakness -seizures -shortness of breath -signs and symptoms of high blood sugar such as dizziness; dry mouth; dry skin; fruity breath; nausea; stomach pain; increased hunger or thirst; increased urination -signs and symptoms of kidney injury like trouble passing urine or change in the amount of urine -signs and symptoms of liver injury like dark yellow or brown urine; general ill feeling or flu-like symptoms; light-colored stools; loss of appetite; nausea; right upper belly pain; unusually weak or tired; yellowing of the eyes or skin -stiff neck -swelling of the ankles, feet, hands -weight gain Side effects that usually do not require medical attention (report to your doctor or health care professional if they continue or are bothersome): -bone pain -constipation -tiredness -vomiting This list may not describe all possible side effects. Call your doctor for medical advice about side effects. You may report side effects to FDA at 1-800-FDA-1088. Where should I keep my medicine? This drug is given in a hospital or clinic and will not be stored at home. NOTE: This sheet is a summary. It may not cover all possible information. If you have questions about this medicine, talk to your doctor, pharmacist, or health care provider.    2016, Elsevier/Gold Standard. (2015-03-01 10:03:42)

## 2016-03-19 NOTE — Progress Notes (Signed)
[facility] [phone]   Fax:([phone]  OFFICE PROGRESS NOTE  [name], NP [phone] [place] [address]  DIAGNOSIS AND STAGE:  #1 Recurrent non-small cell lung cancer consistent with adenocarcinoma. This was initially diagnosed as stage IB (T2a N0 M0) well-differentiated adenocarcinoma of bronchioalveolar type in December 2010.  #2 Acute pulmonary embolism in the right lower lobe lobar, segmental and subsegmental sized pulmonary arteries, diagnosed on 11/04/2011.   PRIOR THERAPY:  1.Status post right upper lobectomy on September 28, 2009 under the care of Dr. Arlyce Dice. The patient refused adjuvant chemotherapy at that time. She had disease recurrence in June of 2012.  2.Status post 4 cycles of systemic chemotherapy with carboplatin for an AUC of 6 and paclitaxel at 200 mg per meter squared and Avastin at 15 mg/kg according to the ECOG protocol #5508.  3. Chemotherapy with Avastin 15 mg/kg according to the ECOG protocol #5508 status post 32 cycles. Last cycle was given on 06/30/2013 discontinued secondary to disease progression and persistent proteinuria. 4. Tarceva 150 mg by mouth daily, started 08/09/2013, status post 10 months of treatment. 5. Tarceva 100 mg by mouth daily started 07/02/2014, status post 19 months, discontinued secondary to disease progression.   CURRENT THERAPY: Immunotherapy with Nivolumab 240 MG IV every 2 weeks. First dose 02/20/2016. Status post 2 cycles.  CHEMOTHERAPY INTENT: Palliative.  CURRENT # OF CHEMOTHERAPY CYCLES: 3 CURRENT ANTIEMETICS: Compazine  CURRENT SMOKING STATUS: Non-smoker  ORAL CHEMOTHERAPY AND CONSENT: Tarceva and consent was signed 07/21/2013.  CURRENT BISPHOSPHONATES USE: None  PAIN MANAGEMENT: No pain  NARCOTICS INDUCED CONSTIPATION: No constipation.  LIVING WILL AND CODE STATUS: Full code  INTERVAL HISTORY: [name] 75 y.o. female returns to the clinic today for follow-up  visit. The patient is feeling fine today with no specific complaints except for mild fatigue. She tolerated the first 2 cycles of her treatment with immunotherapy with Nivolumab fairly well. She is eating very good and gained few pounds recently. She denied having any significant chest pain, shortness of breath, cough or hemoptysis. She denied having any weight loss or night sweats. She has no nausea or vomiting. She has no fever or chills. She is here today to start cycle #3.  MEDICAL HISTORY: Past Medical History  Diagnosis Date  . Hypertension   . Hypothyroidism   . Hypercholesterolemia   . Glaucoma   . Hip pain 09/02/2011  . Foot pain 09/02/2011  . Lung cancer ([facility]) 03/08/2011    recurrent  . 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 Encounter for antineoplastic immunotherapy 02/14/2016  . Chronic fatigue 02/14/2016    ALLERGIES:  is allergic to cymbalta; fish allergy; amitriptyline; codeine; and sulfa antibiotics.   SURGICAL HISTORY:  Past Surgical History  Procedure Laterality Date  . Video assisted thoracoscopy  09/28/2009  . Thoracotomy  09/28/2009    mini  . Lung lobectomy  09/28/2009    right upper    REVIEW OF SYSTEMS:  A comprehensive review of systems was negative except for: Constitutional: positive for fatigue   PHYSICAL EXAMINATION: General appearance: alert, cooperative, fatigued and no distress Head: Normocephalic, without obvious abnormality, atraumatic Neck: no adenopathy, no JVD, supple, symmetrical, trachea midline and thyroid not enlarged, symmetric, no tenderness/mass/nodules Lymph nodes: Cervical, supraclavicular, and axillary nodes normal. Resp: clear to auscultation bilaterally Back: symmetric, no curvature. ROM normal. No CVA tenderness. Cardio: regular rate and rhythm, S1, S2 normal, no murmur, click, rub or  gallop GI: soft, non-tender; bowel sounds normal; no masses,  no organomegaly Extremities: extremities normal, atraumatic, no  cyanosis or edema Neurologic: Alert and oriented X 3, normal strength and tone. Normal symmetric reflexes. Normal coordination and gait  ECOG PERFORMANCE STATUS: 1 - Symptomatic but completely ambulatory  Blood pressure 150/56, pulse 73, temperature 97.7 F (36.5 C), temperature source Oral, resp. rate 19, height '5\' 5"'$  (1.651 m), weight 133 lb 11.2 oz (60.646 kg), SpO2 100 %.  LABORATORY DATA: Lab Results  Component Value Date   WBC 7.7 03/19/2016   HGB 12.0 03/19/2016   HCT 36.7 03/19/2016   MCV 89.1 03/19/2016   PLT 224 03/19/2016      Chemistry      Component Value Date/Time   NA 141 03/19/2016 0922   NA 141 01/11/2015 1603   NA 142 03/08/2011 0754   K 4.0 03/19/2016 0922   K 3.6 01/11/2015 1603   K 4.2 03/08/2011 0754   CL 105 01/11/2015 1603   CL 107 03/30/2013 1036   CL 102 03/08/2011 0754   CO2 26 03/19/2016 0922   CO2 25 05/25/2014 0332   CO2 27 03/08/2011 0754   BUN 14.1 03/19/2016 0922   BUN 10 01/11/2015 1603   BUN 9 03/08/2011 0754   CREATININE 1.2* 03/19/2016 0922   CREATININE 1.20* 01/11/2015 1603   CREATININE 1.0 03/08/2011 0754      Component Value Date/Time   CALCIUM 9.8 03/19/2016 0922   CALCIUM 9.2 05/25/2014 0332   CALCIUM 9.5 03/08/2011 0754   ALKPHOS 87 03/19/2016 0922   ALKPHOS 67 05/25/2014 0332   ALKPHOS 59 03/08/2011 0754   AST 16 03/19/2016 0922   AST 17 05/25/2014 0332   AST 19 03/08/2011 0754   ALT 20 03/19/2016 0922   ALT 9 05/25/2014 0332   ALT 18 03/08/2011 0754   BILITOT 0.34 03/19/2016 0922   BILITOT 0.4 05/25/2014 0332   BILITOT 0.50 03/08/2011 0754       RADIOGRAPHIC STUDIES: No results found.  ASSESSMENT AND PLAN: This is a very pleasant 75 years old African-American female with recurrent non-small cell lung cancer, adenocarcinoma presented with multiple pulmonary nodules currently on treatment with Tarceva 100 mg by mouth daily status post more than 19 months. She is tolerating her treatment fairly well with no  significant adverse effects except for mild fatigue and few episodes of diarrhea. The recent CT scan of the chest showed some evidence for disease progression with enlarging left upper lobe nodule as well as enlarging multiple bilateral groundglass and semisolid lesions. I personally reviewed the images. I discussed the results with the patient today. I recommended for her to discontinue treatment with Tarceva at this point.  She is currently on treatment with immunotherapy with Nivolumab status post 2 cycles. I recommended for the patient to proceed with cycle #3 today as scheduled. For the peripheral neuropathy, the patient will continue vitamin B 12 injection. I will see her back for follow-up visit in 2 weeks for reevaluation and management of any adverse effect of her treatment before starting cycle #4.  She was advised to call immediately if she has any concerning symptoms in the interval. The patient voices understanding of current disease status and treatment options and is in agreement with the current care plan.  All questions were answered. The patient knows to call the clinic with any problems, questions or concerns. We can certainly see the patient much sooner if necessary.  Disclaimer: This note was dictated with voice  recognition software. Similar sounding words can inadvertently be transcribed and may not be corrected upon review.

## 2016-03-19 NOTE — Progress Notes (Signed)
To be completed in Infusion Area

## 2016-03-26 ENCOUNTER — Other Ambulatory Visit: Payer: Self-pay | Admitting: Family

## 2016-03-26 DIAGNOSIS — I6529 Occlusion and stenosis of unspecified carotid artery: Secondary | ICD-10-CM

## 2016-04-02 ENCOUNTER — Other Ambulatory Visit (HOSPITAL_BASED_OUTPATIENT_CLINIC_OR_DEPARTMENT_OTHER): Payer: Medicare Other

## 2016-04-02 ENCOUNTER — Telehealth: Payer: Self-pay | Admitting: Internal Medicine

## 2016-04-02 ENCOUNTER — Ambulatory Visit (HOSPITAL_BASED_OUTPATIENT_CLINIC_OR_DEPARTMENT_OTHER): Payer: Medicare Other | Admitting: Internal Medicine

## 2016-04-02 ENCOUNTER — Ambulatory Visit (HOSPITAL_BASED_OUTPATIENT_CLINIC_OR_DEPARTMENT_OTHER): Payer: Medicare Other

## 2016-04-02 ENCOUNTER — Encounter: Payer: Self-pay | Admitting: Internal Medicine

## 2016-04-02 VITALS — BP 108/62 | HR 80 | Temp 97.6°F | Resp 18 | Ht 65.0 in | Wt 132.6 lb

## 2016-04-02 DIAGNOSIS — C3492 Malignant neoplasm of unspecified part of left bronchus or lung: Secondary | ICD-10-CM

## 2016-04-02 DIAGNOSIS — Z86711 Personal history of pulmonary embolism: Secondary | ICD-10-CM

## 2016-04-02 DIAGNOSIS — Z5112 Encounter for antineoplastic immunotherapy: Secondary | ICD-10-CM

## 2016-04-02 DIAGNOSIS — G620 Drug-induced polyneuropathy: Secondary | ICD-10-CM | POA: Diagnosis not present

## 2016-04-02 DIAGNOSIS — C3491 Malignant neoplasm of unspecified part of right bronchus or lung: Secondary | ICD-10-CM | POA: Diagnosis not present

## 2016-04-02 DIAGNOSIS — E538 Deficiency of other specified B group vitamins: Secondary | ICD-10-CM

## 2016-04-02 DIAGNOSIS — R197 Diarrhea, unspecified: Secondary | ICD-10-CM

## 2016-04-02 DIAGNOSIS — Z95828 Presence of other vascular implants and grafts: Secondary | ICD-10-CM

## 2016-04-02 DIAGNOSIS — C3411 Malignant neoplasm of upper lobe, right bronchus or lung: Secondary | ICD-10-CM

## 2016-04-02 DIAGNOSIS — Z79899 Other long term (current) drug therapy: Secondary | ICD-10-CM

## 2016-04-02 DIAGNOSIS — R200 Anesthesia of skin: Secondary | ICD-10-CM

## 2016-04-02 LAB — CBC WITH DIFFERENTIAL/PLATELET
BASO%: 0.9 % (ref 0.0–2.0)
BASOS ABS: 0.1 10*3/uL (ref 0.0–0.1)
EOS%: 3.3 % (ref 0.0–7.0)
Eosinophils Absolute: 0.3 10*3/uL (ref 0.0–0.5)
HCT: 38.1 % (ref 34.8–46.6)
HEMOGLOBIN: 12.3 g/dL (ref 11.6–15.9)
LYMPH%: 16.5 % (ref 14.0–49.7)
MCH: 28.6 pg (ref 25.1–34.0)
MCHC: 32.2 g/dL (ref 31.5–36.0)
MCV: 88.8 fL (ref 79.5–101.0)
MONO#: 0.7 10*3/uL (ref 0.1–0.9)
MONO%: 8.5 % (ref 0.0–14.0)
NEUT#: 6.1 10*3/uL (ref 1.5–6.5)
NEUT%: 70.8 % (ref 38.4–76.8)
Platelets: 215 10*3/uL (ref 145–400)
RBC: 4.29 10*6/uL (ref 3.70–5.45)
RDW: 14.1 % (ref 11.2–14.5)
WBC: 8.6 10*3/uL (ref 3.9–10.3)
lymph#: 1.4 10*3/uL (ref 0.9–3.3)

## 2016-04-02 LAB — COMPREHENSIVE METABOLIC PANEL
ALBUMIN: 3.9 g/dL (ref 3.5–5.0)
ALT: 19 U/L (ref 0–55)
AST: 17 U/L (ref 5–34)
Alkaline Phosphatase: 80 U/L (ref 40–150)
Anion Gap: 9 mEq/L (ref 3–11)
BUN: 15.5 mg/dL (ref 7.0–26.0)
CHLORIDE: 106 meq/L (ref 98–109)
CO2: 24 meq/L (ref 22–29)
Calcium: 10.3 mg/dL (ref 8.4–10.4)
Creatinine: 1.3 mg/dL — ABNORMAL HIGH (ref 0.6–1.1)
EGFR: 45 mL/min/{1.73_m2} — ABNORMAL LOW (ref >90)
GLUCOSE: 80 mg/dL (ref 70–140)
POTASSIUM: 3.8 meq/L (ref 3.5–5.1)
SODIUM: 139 meq/L (ref 136–145)
Total Bilirubin: 0.51 mg/dL (ref 0.20–1.20)
Total Protein: 7.6 g/dL (ref 6.4–8.3)

## 2016-04-02 LAB — TSH: TSH: 0.222 m(IU)/L — ABNORMAL LOW (ref 0.308–3.960)

## 2016-04-02 MED ORDER — HEPARIN SOD (PORK) LOCK FLUSH 100 UNIT/ML IV SOLN
500.0000 [IU] | Freq: Once | INTRAVENOUS | Status: AC | PRN
  Administered 2016-04-02: 500 [IU]
  Filled 2016-04-02: qty 5

## 2016-04-02 MED ORDER — SODIUM CHLORIDE 0.9% FLUSH
10.0000 mL | INTRAVENOUS | Status: DC | PRN
  Administered 2016-04-02: 10 mL
  Filled 2016-04-02: qty 10

## 2016-04-02 MED ORDER — SODIUM CHLORIDE 0.9 % IV SOLN
240.0000 mg | Freq: Once | INTRAVENOUS | Status: AC
  Administered 2016-04-02: 240 mg via INTRAVENOUS
  Filled 2016-04-02: qty 8

## 2016-04-02 MED ORDER — SODIUM CHLORIDE 0.9 % IV SOLN
Freq: Once | INTRAVENOUS | Status: AC
  Administered 2016-04-02: 10:00:00 via INTRAVENOUS

## 2016-04-02 NOTE — Telephone Encounter (Signed)
Gave and printed avs

## 2016-04-02 NOTE — Patient Instructions (Signed)
[facility] Discharge Instructions for Patients Receiving Chemotherapy  Today you received the following chemotherapy agents Nivolumab. To help prevent nausea and vomiting after your treatment, we encourage you to take your nausea medication as directed.   If you develop nausea and vomiting that is not controlled by your nausea medication, call the clinic.   BELOW ARE SYMPTOMS THAT SHOULD BE REPORTED IMMEDIATELY:  *FEVER GREATER THAN 100.5 F  *CHILLS WITH OR WITHOUT FEVER  NAUSEA AND VOMITING THAT IS NOT CONTROLLED WITH YOUR NAUSEA MEDICATION  *UNUSUAL SHORTNESS OF BREATH  *UNUSUAL BRUISING OR BLEEDING  TENDERNESS IN MOUTH AND THROAT WITH OR WITHOUT PRESENCE OF ULCERS  *URINARY PROBLEMS  *BOWEL PROBLEMS  UNUSUAL RASH Items with * indicate a potential emergency and should be followed up as soon as possible.  Feel free to call the clinic you have any questions or concerns. The clinic phone number is [phone].  Please show the [facility] at check-in to the Emergency Department and triage nurse.  Nivolumab injection What is this medicine? NIVOLUMAB (nye VOL ue mab) is a monoclonal antibody. It is used to treat melanoma, lung cancer, kidney cancer, and Hodgkin lymphoma. This medicine may be used for other purposes; ask your health care provider or pharmacist if you have questions. What should I tell my health care provider before I take this medicine? They need to know if you have any of these conditions: -diabetes -immune system problems -kidney disease -liver disease -lung disease -organ transplant -stomach or intestine problems -thyroid disease -an unusual or allergic reaction to nivolumab, other medicines, foods, dyes, or preservatives -pregnant or trying to get pregnant -breast-feeding How should I use this medicine? This medicine is for infusion into a vein. It is given by a health care professional in a hospital or clinic setting. A  special MedGuide will be given to you before each treatment. Be sure to read this information carefully each time. Talk to your pediatrician regarding the use of this medicine in children. Special care may be needed. Overdosage: If you think you have taken too much of this medicine contact a poison control center or emergency room at once. NOTE: This medicine is only for you. Do not share this medicine with others. What if I miss a dose? It is important not to miss your dose. Call your doctor or health care professional if you are unable to keep an appointment. What may interact with this medicine? Interactions have not been studied. Give your health care provider a list of all the medicines, herbs, non-prescription drugs, or dietary supplements you use. Also tell them if you smoke, drink alcohol, or use illegal drugs. Some items may interact with your medicine. This list may not describe all possible interactions. Give your health care provider a list of all the medicines, herbs, non-prescription drugs, or dietary supplements you use. Also tell them if you smoke, drink alcohol, or use illegal drugs. Some items may interact with your medicine. What should I watch for while using this medicine? This drug may make you feel generally unwell. Continue your course of treatment even though you feel ill unless your doctor tells you to stop. You may need blood work done while you are taking this medicine. Do not become pregnant while taking this medicine or for 5 months after stopping it. Women should inform their doctor if they wish to become pregnant or think they might be pregnant. There is a potential for serious side effects to an unborn child.  Talk to your health care professional or pharmacist for more information. Do not breast-feed an infant while taking this medicine. What side effects may I notice from receiving this medicine? Side effects that you should report to your doctor or health care  professional as soon as possible: -allergic reactions like skin rash, itching or hives, swelling of the face, lips, or tongue -black, tarry stools -blood in the urine -bloody or watery diarrhea -changes in vision -change in sex drive -changes in emotions or moods -chest pain -confusion -cough -decreased appetite -diarrhea -facial flushing -feeling faint or lightheaded -fever, chills -hair loss -hallucination, loss of contact with reality -headache -irritable -joint pain -loss of memory -muscle pain -muscle weakness -seizures -shortness of breath -signs and symptoms of high blood sugar such as dizziness; dry mouth; dry skin; fruity breath; nausea; stomach pain; increased hunger or thirst; increased urination -signs and symptoms of kidney injury like trouble passing urine or change in the amount of urine -signs and symptoms of liver injury like dark yellow or brown urine; general ill feeling or flu-like symptoms; light-colored stools; loss of appetite; nausea; right upper belly pain; unusually weak or tired; yellowing of the eyes or skin -stiff neck -swelling of the ankles, feet, hands -weight gain Side effects that usually do not require medical attention (report to your doctor or health care professional if they continue or are bothersome): -bone pain -constipation -tiredness -vomiting This list may not describe all possible side effects. Call your doctor for medical advice about side effects. You may report side effects to FDA at 1-800-FDA-1088. Where should I keep my medicine? This drug is given in a hospital or clinic and will not be stored at home. NOTE: This sheet is a summary. It may not cover all possible information. If you have questions about this medicine, talk to your doctor, pharmacist, or health care provider.    2016, [employer]vier/Gold Standard. (2015-03-01 10:03:42)

## 2016-04-02 NOTE — Progress Notes (Signed)
[facility] [phone]   [phone]  OFFICE PROGRESS NOTE  [name], NP [phone] [address] [place] [address]  DIAGNOSIS AND STAGE:  #1 Recurrent non-small cell lung cancer consistent with adenocarcinoma. This was initially diagnosed as stage IB (T2a N0 M0) well-differentiated adenocarcinoma of bronchioalveolar type in December 2010.  #2 Acute pulmonary embolism in the right lower lobe lobar, segmental and subsegmental sized pulmonary arteries, diagnosed on 11/04/2011.   PRIOR THERAPY:  1.Status post right upper lobectomy on September 28, 2009 under the care of Dr. Arlyce Dice. The patient refused adjuvant chemotherapy at that time. She had disease recurrence in June of 2012.  2.Status post 4 cycles of systemic chemotherapy with carboplatin for an AUC of 6 and paclitaxel at 200 mg per meter squared and Avastin at 15 mg/kg according to the ECOG protocol #5508.  3. Chemotherapy with Avastin 15 mg/kg according to the ECOG protocol #5508 status post 32 cycles. Last cycle was given on 06/30/2013 discontinued secondary to disease progression and persistent proteinuria. 4. Tarceva 150 mg by mouth daily, started 08/09/2013, status post 10 months of treatment. 5. Tarceva 100 mg by mouth daily started 07/02/2014, status post 19 months, discontinued secondary to disease progression.   CURRENT THERAPY: Immunotherapy with Nivolumab 240 MG IV every 2 weeks. First dose 02/20/2016. Status post 3 cycles.  CHEMOTHERAPY INTENT: Palliative.  CURRENT # OF CHEMOTHERAPY CYCLES: 4 CURRENT ANTIEMETICS: Compazine  CURRENT SMOKING STATUS: Non-smoker  ORAL CHEMOTHERAPY AND CONSENT: Tarceva and consent was signed 07/21/2013.  CURRENT BISPHOSPHONATES USE: None  PAIN MANAGEMENT: No pain  NARCOTICS INDUCED CONSTIPATION: No constipation.  LIVING WILL AND CODE STATUS: Full code  INTERVAL HISTORY: [name] 75 y.o. female returns to the clinic today for follow-up  visit. The patient is feeling fine today with no specific complaints. She tolerated the first 3 cycles of her treatment with immunotherapy with Nivolumab fairly well. She is eating very good and gained few more pounds recently. She denied having any significant chest pain, shortness of breath, cough or hemoptysis. She denied having any weight loss or night sweats. She has no nausea or vomiting. She has no fever or chills. She is here today to start cycle #4.  MEDICAL HISTORY: Past Medical History  Diagnosis Date  . Hypertension   . Hypothyroidism   . Hypercholesterolemia   . Glaucoma   . Hip pain 09/02/2011  . Foot pain 09/02/2011  . Lung cancer ([place]) 03/08/2011    recurrent  . 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 Encounter for antineoplastic immunotherapy 02/14/2016  . Chronic fatigue 02/14/2016    ALLERGIES:  is allergic to cymbalta; fish allergy; amitriptyline; codeine; and sulfa antibiotics.   SURGICAL HISTORY:  Past Surgical History  Procedure Laterality Date  . Video assisted thoracoscopy  09/28/2009  . Thoracotomy  09/28/2009    mini  . Lung lobectomy  09/28/2009    right upper    REVIEW OF SYSTEMS:  A comprehensive review of systems was negative.   PHYSICAL EXAMINATION: General appearance: alert, cooperative, fatigued and no distress Head: Normocephalic, without obvious abnormality, atraumatic Neck: no adenopathy, no JVD, supple, symmetrical, trachea midline and thyroid not enlarged, symmetric, no tenderness/mass/nodules Lymph nodes: Cervical, supraclavicular, and axillary nodes normal. Resp: clear to auscultation bilaterally Back: symmetric, no curvature. ROM normal. No CVA tenderness. Cardio: regular rate and rhythm, S1, S2 normal, no murmur, click, rub or gallop GI: soft, non-tender; bowel sounds normal; no masses,  no organomegaly Extremities: extremities normal, atraumatic, no cyanosis or edema Neurologic: Alert and oriented X 3, normal strength and  tone. Normal symmetric reflexes. Normal coordination and gait  ECOG PERFORMANCE STATUS: 1 - Symptomatic but completely ambulatory  There were no vitals taken for this visit.  LABORATORY DATA: Lab Results  Component Value Date   WBC 7.7 03/19/2016   HGB 12.0 03/19/2016   HCT 36.7 03/19/2016   MCV 89.1 03/19/2016   PLT 224 03/19/2016      Chemistry      Component Value Date/Time   NA 141 03/19/2016 0922   NA 141 01/11/2015 1603   NA 142 03/08/2011 0754   K 4.0 03/19/2016 0922   K 3.6 01/11/2015 1603   K 4.2 03/08/2011 0754   CL 105 01/11/2015 1603   CL 107 03/30/2013 1036   CL 102 03/08/2011 0754   CO2 26 03/19/2016 0922   CO2 25 05/25/2014 0332   CO2 27 03/08/2011 0754   BUN 14.1 03/19/2016 0922   BUN 10 01/11/2015 1603   BUN 9 03/08/2011 0754   CREATININE 1.2* 03/19/2016 0922   CREATININE 1.20* 01/11/2015 1603   CREATININE 1.0 03/08/2011 0754      Component Value Date/Time   CALCIUM 9.8 03/19/2016 0922   CALCIUM 9.2 05/25/2014 0332   CALCIUM 9.5 03/08/2011 0754   ALKPHOS 87 03/19/2016 0922   ALKPHOS 67 05/25/2014 0332   ALKPHOS 59 03/08/2011 0754   AST 16 03/19/2016 0922   AST 17 05/25/2014 0332   AST 19 03/08/2011 0754   ALT 20 03/19/2016 0922   ALT 9 05/25/2014 0332   ALT 18 03/08/2011 0754   BILITOT 0.34 03/19/2016 0922   BILITOT 0.4 05/25/2014 0332   BILITOT 0.50 03/08/2011 0754       RADIOGRAPHIC STUDIES: No results found.  ASSESSMENT AND PLAN: This is a very pleasant 75 years old African-American female with recurrent non-small cell lung cancer, adenocarcinoma presented with multiple pulmonary nodules currently on treatment with Tarceva 100 mg by mouth daily status post more than 19 months. She is tolerating her treatment fairly well with no significant adverse effects except for mild fatigue and few episodes of diarrhea. The recent CT scan of the chest showed some evidence for disease progression with enlarging left upper lobe nodule as well as  enlarging multiple bilateral groundglass and semisolid lesions. I personally reviewed the images. I discussed the results with the patient today. I recommended for her to discontinue treatment with Tarceva at this point.  She is currently on treatment with immunotherapy with Nivolumab status post 3 cycles. I recommended for the patient to proceed with cycle #4 today as scheduled. For the peripheral neuropathy, the patient will continue vitamin B 12 injection. I will see her back for follow-up visit in 2 weeks for reevaluation with repeat CT scan of the chest for restaging of her disease before starting cycle #5. She was advised to call immediately if she has any concerning symptoms in the interval. The patient voices understanding of current disease status and treatment options and is in agreement with the current care plan.  All questions were answered. The patient knows to call the clinic with any problems, questions or concerns. We can certainly see the patient much sooner if necessary.  Disclaimer: This note was dictated with voice recognition software. Similar sounding words can inadvertently be transcribed and may not be corrected upon review.

## 2016-04-15 ENCOUNTER — Encounter (HOSPITAL_COMMUNITY): Payer: Self-pay

## 2016-04-15 ENCOUNTER — Ambulatory Visit (HOSPITAL_COMMUNITY)
Admission: RE | Admit: 2016-04-15 | Discharge: 2016-04-15 | Disposition: A | Discharge status: Home / Self Care | Payer: Medicare Other | Source: Ambulatory Visit | Attending: Internal Medicine | Admitting: Internal Medicine

## 2016-04-15 ENCOUNTER — Other Ambulatory Visit: Payer: Self-pay | Admitting: *Deleted

## 2016-04-15 DIAGNOSIS — I700 Atherosclerosis of aorta: Secondary | ICD-10-CM | POA: Insufficient documentation

## 2016-04-15 DIAGNOSIS — C3492 Malignant neoplasm of unspecified part of left bronchus or lung: Secondary | ICD-10-CM | POA: Insufficient documentation

## 2016-04-15 DIAGNOSIS — I2510 Atherosclerotic heart disease of native coronary artery without angina pectoris: Secondary | ICD-10-CM | POA: Insufficient documentation

## 2016-04-15 DIAGNOSIS — R918 Other nonspecific abnormal finding of lung field: Secondary | ICD-10-CM | POA: Diagnosis not present

## 2016-04-15 DIAGNOSIS — Z5112 Encounter for antineoplastic immunotherapy: Secondary | ICD-10-CM | POA: Diagnosis not present

## 2016-04-15 DIAGNOSIS — C3491 Malignant neoplasm of unspecified part of right bronchus or lung: Secondary | ICD-10-CM | POA: Diagnosis not present

## 2016-04-15 DIAGNOSIS — R911 Solitary pulmonary nodule: Secondary | ICD-10-CM | POA: Insufficient documentation

## 2016-04-15 MED ORDER — IOPAMIDOL (ISOVUE-300) INJECTION 61%
75.0000 mL | Freq: Once | INTRAVENOUS | Status: AC | PRN
  Administered 2016-04-15: 75 mL via INTRAVENOUS

## 2016-04-17 ENCOUNTER — Ambulatory Visit (HOSPITAL_BASED_OUTPATIENT_CLINIC_OR_DEPARTMENT_OTHER): Payer: Medicare Other | Admitting: Internal Medicine

## 2016-04-17 ENCOUNTER — Ambulatory Visit (HOSPITAL_BASED_OUTPATIENT_CLINIC_OR_DEPARTMENT_OTHER): Payer: Medicare Other

## 2016-04-17 ENCOUNTER — Encounter: Payer: Self-pay | Admitting: Internal Medicine

## 2016-04-17 ENCOUNTER — Other Ambulatory Visit (HOSPITAL_BASED_OUTPATIENT_CLINIC_OR_DEPARTMENT_OTHER): Payer: Medicare Other

## 2016-04-17 ENCOUNTER — Ambulatory Visit: Payer: Medicare Other

## 2016-04-17 VITALS — BP 147/63 | HR 78 | Temp 98.0°F | Resp 18 | Ht 65.0 in | Wt 133.6 lb

## 2016-04-17 DIAGNOSIS — C3491 Malignant neoplasm of unspecified part of right bronchus or lung: Secondary | ICD-10-CM

## 2016-04-17 DIAGNOSIS — C3492 Malignant neoplasm of unspecified part of left bronchus or lung: Secondary | ICD-10-CM

## 2016-04-17 DIAGNOSIS — E538 Deficiency of other specified B group vitamins: Secondary | ICD-10-CM | POA: Diagnosis not present

## 2016-04-17 DIAGNOSIS — G620 Drug-induced polyneuropathy: Secondary | ICD-10-CM | POA: Diagnosis not present

## 2016-04-17 DIAGNOSIS — R200 Anesthesia of skin: Secondary | ICD-10-CM

## 2016-04-17 DIAGNOSIS — Z5112 Encounter for antineoplastic immunotherapy: Secondary | ICD-10-CM

## 2016-04-17 DIAGNOSIS — Z86711 Personal history of pulmonary embolism: Secondary | ICD-10-CM

## 2016-04-17 LAB — CBC WITH DIFFERENTIAL/PLATELET
BASO%: 1.3 % (ref 0.0–2.0)
Basophils Absolute: 0.1 10*3/uL (ref 0.0–0.1)
EOS%: 4.1 % (ref 0.0–7.0)
Eosinophils Absolute: 0.3 10*3/uL (ref 0.0–0.5)
HEMATOCRIT: 36.9 % (ref 34.8–46.6)
HEMOGLOBIN: 12.1 g/dL (ref 11.6–15.9)
LYMPH#: 1.6 10*3/uL (ref 0.9–3.3)
LYMPH%: 20.2 % (ref 14.0–49.7)
MCH: 29.1 pg (ref 25.1–34.0)
MCHC: 32.9 g/dL (ref 31.5–36.0)
MCV: 88.6 fL (ref 79.5–101.0)
MONO#: 0.7 10*3/uL (ref 0.1–0.9)
MONO%: 8.4 % (ref 0.0–14.0)
NEUT#: 5.3 10*3/uL (ref 1.5–6.5)
NEUT%: 66 % (ref 38.4–76.8)
Platelets: 214 10*3/uL (ref 145–400)
RBC: 4.16 10*6/uL (ref 3.70–5.45)
RDW: 14.2 % (ref 11.2–14.5)
WBC: 8.1 10*3/uL (ref 3.9–10.3)

## 2016-04-17 LAB — COMPREHENSIVE METABOLIC PANEL
ALT: 14 U/L (ref 0–55)
AST: 12 U/L (ref 5–34)
Albumin: 3.6 g/dL (ref 3.5–5.0)
Alkaline Phosphatase: 80 U/L (ref 40–150)
Anion Gap: 8 mEq/L (ref 3–11)
BUN: 13.2 mg/dL (ref 7.0–26.0)
CALCIUM: 9.7 mg/dL (ref 8.4–10.4)
CHLORIDE: 108 meq/L (ref 98–109)
CO2: 24 mEq/L (ref 22–29)
CREATININE: 1.3 mg/dL — AB (ref 0.6–1.1)
EGFR: 47 mL/min/{1.73_m2} — ABNORMAL LOW (ref >90)
GLUCOSE: 82 mg/dL (ref 70–140)
Potassium: 4.1 mEq/L (ref 3.5–5.1)
SODIUM: 140 meq/L (ref 136–145)
Total Bilirubin: <0.3 mg/dL (ref 0.20–1.20)
Total Protein: 7 g/dL (ref 6.4–8.3)

## 2016-04-17 MED ORDER — HEPARIN SOD (PORK) LOCK FLUSH 100 UNIT/ML IV SOLN
500.0000 [IU] | Freq: Once | INTRAVENOUS | Status: AC | PRN
  Administered 2016-04-17: 500 [IU]
  Filled 2016-04-17: qty 5

## 2016-04-17 MED ORDER — SODIUM CHLORIDE 0.9 % IV SOLN
Freq: Once | INTRAVENOUS | Status: AC
  Administered 2016-04-17: 10:00:00 via INTRAVENOUS

## 2016-04-17 MED ORDER — NIVOLUMAB CHEMO INJECTION 100 MG/10ML
240.0000 mg | Freq: Once | INTRAVENOUS | Status: AC
  Administered 2016-04-17: 240 mg via INTRAVENOUS
  Filled 2016-04-17: qty 20

## 2016-04-17 MED ORDER — CYANOCOBALAMIN 1000 MCG/ML IJ SOLN
1000.0000 ug | Freq: Once | INTRAMUSCULAR | Status: AC
  Administered 2016-04-17: 1000 ug via INTRAMUSCULAR

## 2016-04-17 MED ORDER — CYANOCOBALAMIN 1000 MCG/ML IJ SOLN
INTRAMUSCULAR | Status: AC
  Filled 2016-04-17: qty 1

## 2016-04-17 MED ORDER — SODIUM CHLORIDE 0.9% FLUSH
10.0000 mL | INTRAVENOUS | Status: DC | PRN
  Administered 2016-04-17: 10 mL
  Filled 2016-04-17: qty 10

## 2016-04-17 NOTE — Progress Notes (Signed)
Injection given in treatment room.

## 2016-04-17 NOTE — Progress Notes (Signed)
[facility] [phone]   Fax:(336) [phone]  OFFICE PROGRESS NOTE  [name], NP [phone] [place] [address]  DIAGNOSIS AND STAGE:  #1 Recurrent non-small cell lung cancer consistent with adenocarcinoma. This was initially diagnosed as stage IB (T2a N0 M0) well-differentiated adenocarcinoma of bronchioalveolar type in December 2010.  #2 Acute pulmonary embolism in the right lower lobe lobar, segmental and subsegmental sized pulmonary arteries, diagnosed on 11/04/2011.   PRIOR THERAPY:  1.Status post right upper lobectomy on September 28, 2009 under the care of Dr. Arlyce Dice. The patient refused adjuvant chemotherapy at that time. She had disease recurrence in June of 2012.  2.Status post 4 cycles of systemic chemotherapy with carboplatin for an AUC of 6 and paclitaxel at 200 mg per meter squared and Avastin at 15 mg/kg according to the ECOG protocol #5508.  3. Chemotherapy with Avastin 15 mg/kg according to the ECOG protocol #5508 status post 32 cycles. Last cycle was given on 06/30/2013 discontinued secondary to disease progression and persistent proteinuria. 4. Tarceva 150 mg by mouth daily, started 08/09/2013, status post 10 months of treatment. 5. Tarceva 100 mg by mouth daily started 07/02/2014, status post 19 months, discontinued secondary to disease progression.   CURRENT THERAPY: Immunotherapy with Nivolumab 240 MG IV every 2 weeks. First dose 02/20/2016. Status post 4 cycles.  CHEMOTHERAPY INTENT: Palliative.  CURRENT # OF CHEMOTHERAPY CYCLES: 5 CURRENT ANTIEMETICS: Compazine  CURRENT SMOKING STATUS: Non-smoker  ORAL CHEMOTHERAPY AND CONSENT: Tarceva and consent was signed 07/21/2013.  CURRENT BISPHOSPHONATES USE: None  PAIN MANAGEMENT: No pain  NARCOTICS INDUCED CONSTIPATION: No constipation.  LIVING WILL AND CODE STATUS: Full code  INTERVAL HISTORY: WARRENE [name] 75 y.o. female returns to the clinic today for follow-up  visit. The patient is feeling fine today with no specific complaints. She tolerated the first 4 cycles of her treatment with immunotherapy with Nivolumab fairly well. She gained one more pounds since her last visit. She denied having any significant chest pain, shortness of breath, cough or hemoptysis. She denied having any weight loss or night sweats. She has no nausea or vomiting. She has no fever or chills. She had repeat CT scan of the chest performed recently and she is here for evaluation and discussion of her scan results.  MEDICAL HISTORY: Past Medical History  Diagnosis Date  . Hypertension   . Hypothyroidism   . Hypercholesterolemia   . Glaucoma   . Hip pain 09/02/2011  . Foot pain 09/02/2011  . Lung cancer ([place]) 03/08/2011    recurrent  . 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 Encounter for antineoplastic immunotherapy 02/14/2016  . Chronic fatigue 02/14/2016    ALLERGIES:  is allergic to cymbalta; fish allergy; amitriptyline; codeine; and sulfa antibiotics.   SURGICAL HISTORY:  Past Surgical History  Procedure Laterality Date  . Video assisted thoracoscopy  09/28/2009  . Thoracotomy  09/28/2009    mini  . Lung lobectomy  09/28/2009    right upper    REVIEW OF SYSTEMS:  Constitutional: negative Eyes: negative Ears, nose, mouth, throat, and face: negative Respiratory: negative Cardiovascular: negative Gastrointestinal: negative Genitourinary:negative Integument/breast: negative Hematologic/lymphatic: negative Musculoskeletal:negative Neurological: negative Behavioral/Psych: negative Endocrine: negative Allergic/Immunologic: negative   PHYSICAL EXAMINATION: General appearance: alert, cooperative, fatigued and no distress Head: Normocephalic, without obvious abnormality, atraumatic Neck: no adenopathy, no JVD, supple, symmetrical, trachea midline and thyroid not enlarged, symmetric, no tenderness/mass/nodules Lymph nodes: Cervical, supraclavicular,  and axillary nodes  normal. Resp: clear to auscultation bilaterally Back: symmetric, no curvature. ROM normal. No CVA tenderness. Cardio: regular rate and rhythm, S1, S2 normal, no murmur, click, rub or gallop GI: soft, non-tender; bowel sounds normal; no masses,  no organomegaly Extremities: extremities normal, atraumatic, no cyanosis or edema Neurologic: Alert and oriented X 3, normal strength and tone. Normal symmetric reflexes. Normal coordination and gait  ECOG PERFORMANCE STATUS: 1 - Symptomatic but completely ambulatory  Blood pressure 147/63, pulse 78, temperature 98 F (36.7 C), temperature source Oral, resp. rate 18, height '5\' 5"'$  (1.651 m), weight 133 lb 9.6 oz (60.601 kg), SpO2 100 %.  LABORATORY DATA: Lab Results  Component Value Date   WBC 8.1 04/17/2016   HGB 12.1 04/17/2016   HCT 36.9 04/17/2016   MCV 88.6 04/17/2016   PLT 214 04/17/2016      Chemistry      Component Value Date/Time   NA 140 04/17/2016 0844   NA 141 01/11/2015 1603   NA 142 03/08/2011 0754   K 4.1 04/17/2016 0844   K 3.6 01/11/2015 1603   K 4.2 03/08/2011 0754   CL 105 01/11/2015 1603   CL 107 03/30/2013 1036   CL 102 03/08/2011 0754   CO2 24 04/17/2016 0844   CO2 25 05/25/2014 0332   CO2 27 03/08/2011 0754   BUN 13.2 04/17/2016 0844   BUN 10 01/11/2015 1603   BUN 9 03/08/2011 0754   CREATININE 1.3* 04/17/2016 0844   CREATININE 1.20* 01/11/2015 1603   CREATININE 1.0 03/08/2011 0754      Component Value Date/Time   CALCIUM 9.7 04/17/2016 0844   CALCIUM 9.2 05/25/2014 0332   CALCIUM 9.5 03/08/2011 0754   ALKPHOS 80 04/17/2016 0844   ALKPHOS 67 05/25/2014 0332   ALKPHOS 59 03/08/2011 0754   AST 12 04/17/2016 0844   AST 17 05/25/2014 0332   AST 19 03/08/2011 0754   ALT 14 04/17/2016 0844   ALT 9 05/25/2014 0332   ALT 18 03/08/2011 0754   BILITOT <0.30 04/17/2016 0844   BILITOT 0.4 05/25/2014 0332   BILITOT 0.50 03/08/2011 0754       RADIOGRAPHIC STUDIES: Ct Chest W  Contrast  04/15/2016  CLINICAL DATA:  Lung cancer diagnosed in 2010 with partial right upper lobectomy. Finished chemotherapy in 2014. Ex-smoker. anti neoplastic immunotherapy. Staging. Palpable abnormality posterior right shoulder. EXAM: CT CHEST WITH CONTRAST TECHNIQUE: Multidetector CT imaging of the chest was performed during intravenous contrast administration. CONTRAST:  27m ISOVUE-300 IOPAMIDOL (ISOVUE-300) INJECTION 61% COMPARISON:  02/12/2016 FINDINGS: Cardiovascular: Aortic and branch vessel atherosclerosis. Normal heart size, without pericardial effusion. Atherosclerosis. No central pulmonary embolism, on this non-dedicated study. Mediastinum/Nodes: No supraclavicular adenopathy. Right Port-A-Cath terminates at the high right atrium. Calcified left hilar nodes. No mediastinal or hilar adenopathy. Tiny hiatal hernia. Lungs/Pleura: No pleural fluid. Status post right upper lobectomy. Centrilobular emphysema. Subpleural right lower lobe partially solid partially cystic nodule. The solid component measures 2.1 x 1.8 cm today versus 2.2 x 2.0 cm previously. The entire nodule measures maximally 3.2 cm today versus 3.1 cm previously. This suggests stability. Partially solid partially cystic right lower lobe lesion more laterally measures 2.2 x 1.9 cm on image 47/ series 5. Compare 2.5 x 1.9 cm previously. This suggests stability versus minimal decrease. Mixed solid and sub solid superior segment left lower lobe pulmonary mass measures 4.7 x 5.2 cm on image 57/series 5. Compare 7.4 x 5.0 cm previously. The most well-defined solid portion measures 2.5 x 2.7 cm on image  54/ series 5. Compare 3.0 x 2.8 cm previously. The previously described left upper lobe pulmonary nodule is nearly completely resolved. Only minimal interstitial thickening remains at this site (image 48/series 5.) Other smaller primarily ground-glass nodules are not significantly changed. Upper Abdomen: Old granulomatous disease in the liver.  Subcentimeter right hepatic lobe cyst. Old granulomatous disease in the spleen. Normal imaged portions of the pancreas, gallbladder, adrenal glands, kidneys. Abdominal aortic atherosclerosis. Musculoskeletal: No acute osseous abnormality. Palpable abnormality marked on image 24/series 2 without underlying solid or cystic mass. IMPRESSION: 1. Decreased size of superior segment left lower lobe solid and sub solid pulmonary mass. 2. The solid left upper lobe pulmonary nodule is nearly completely resolved. 3. Similar to decreased size of other pulmonary lesions as detailed above. 4. No thoracic adenopathy. 5.  Coronary artery atherosclerosis. Aortic atherosclerosis. 6. No correlate for the palpable abnormality about the posterior right shoulder/chest wall. Electronically Signed   By: Abigail Miyamoto M.D.   On: 04/15/2016 10:12    ASSESSMENT AND PLAN: This is a very pleasant 75 years old African-American female with recurrent non-small cell lung cancer, adenocarcinoma presented with multiple pulmonary nodules currently on treatment with Tarceva 100 mg by mouth daily status post more than 19 months. She is tolerating her treatment fairly well with no significant adverse effects except for mild fatigue and few episodes of diarrhea. The recent CT scan of the chest showed some evidence for disease progression with enlarging left upper lobe nodule as well as enlarging multiple bilateral groundglass and semisolid lesions. I personally reviewed the images. I discussed the results with the patient today. I recommended for her to discontinue treatment with Tarceva at this point.  She is currently on treatment with immunotherapy with Nivolumab status post 4 cycles.  The recent CT scan of the chest showed improvement of her disease. I discussed the scan results with the patient today. I recommended for the patient to proceed with cycle #5 today as scheduled. For the peripheral neuropathy, the patient will continue vitamin B  12 injection. I will see her back for follow-up visit in 2 weeks for reevaluation before starting cycle #6. She was advised to call immediately if she has any concerning symptoms in the interval. The patient voices understanding of current disease status and treatment options and is in agreement with the current care plan.  All questions were answered. The patient knows to call the clinic with any problems, questions or concerns. We can certainly see the patient much sooner if necessary.  Disclaimer: This note was dictated with voice recognition software. Similar sounding words can inadvertently be transcribed and may not be corrected upon review.

## 2016-04-17 NOTE — Patient Instructions (Addendum)
[facility] Discharge Instructions for Patients Receiving Chemotherapy  Today you received the following chemotherapy agents Nivolumab. To help prevent nausea and vomiting after your treatment, we encourage you to take your nausea medication as directed.   If you develop nausea and vomiting that is not controlled by your nausea medication, call the clinic.   BELOW ARE SYMPTOMS THAT SHOULD BE REPORTED IMMEDIATELY:  *FEVER GREATER THAN 100.5 F  *CHILLS WITH OR WITHOUT FEVER  NAUSEA AND VOMITING THAT IS NOT CONTROLLED WITH YOUR NAUSEA MEDICATION  *UNUSUAL SHORTNESS OF BREATH  *UNUSUAL BRUISING OR BLEEDING  TENDERNESS IN MOUTH AND THROAT WITH OR WITHOUT PRESENCE OF ULCERS  *URINARY PROBLEMS  *BOWEL PROBLEMS  UNUSUAL RASH Items with * indicate a potential emergency and should be followed up as soon as possible.  Feel free to call the clinic you have any questions or concerns. The clinic phone number is [phone].  Please show the [facility] at check-in to the Emergency Department and triage nurse.  Cyanocobalamin, Vitamin B12 injection What is this medicine? CYANOCOBALAMIN (sye an oh koe BAL a min) is a man made form of vitamin B12. Vitamin B12 is used in the growth of healthy blood cells, nerve cells, and proteins in the body. It also helps with the metabolism of fats and carbohydrates. This medicine is used to treat people who can not absorb vitamin B12. This medicine may be used for other purposes; ask your health care provider or pharmacist if you have questions. What should I tell my health care provider before I take this medicine? They need to know if you have any of these conditions: -kidney disease -Leber's disease -megaloblastic anemia -an unusual or allergic reaction to cyanocobalamin, cobalt, other medicines, foods, dyes, or preservatives -pregnant or trying to get pregnant -breast-feeding How should I use this medicine? This medicine is  injected into a muscle or deeply under the skin. It is usually given by a health care professional in a clinic or doctor's office. However, your doctor may teach you how to inject yourself. Follow all instructions. Talk to your pediatrician regarding the use of this medicine in children. Special care may be needed. Overdosage: If you think you have taken too much of this medicine contact a poison control center or emergency room at once. NOTE: This medicine is only for you. Do not share this medicine with others. What if I miss a dose? If you are given your dose at a clinic or doctor's office, call to reschedule your appointment. If you give your own injections and you miss a dose, take it as soon as you can. If it is almost time for your next dose, take only that dose. Do not take double or extra doses. What may interact with this medicine? -colchicine -heavy alcohol intake This list may not describe all possible interactions. Give your health care provider a list of all the medicines, herbs, non-prescription drugs, or dietary supplements you use. Also tell them if you smoke, drink alcohol, or use illegal drugs. Some items may interact with your medicine. What should I watch for while using this medicine? Visit your doctor or health care professional regularly. You may need blood work done while you are taking this medicine. You may need to follow a special diet. Talk to your doctor. Limit your alcohol intake and avoid smoking to get the best benefit. What side effects may I notice from receiving this medicine? Side effects that you should report to your doctor or  health care professional as soon as possible: -allergic reactions like skin rash, itching or hives, swelling of the face, lips, or tongue -blue tint to skin -chest tightness, pain -difficulty breathing, wheezing -dizziness -red, swollen painful area on the leg Side effects that usually do not require medical attention (report to your  doctor or health care professional if they continue or are bothersome): -diarrhea -headache This list may not describe all possible side effects. Call your doctor for medical advice about side effects. You may report side effects to FDA at 1-800-FDA-1088. Where should I keep my medicine? Keep out of the reach of children. Store at room temperature between 15 and 30 degrees C (59 and 85 degrees F). Protect from light. Throw away any unused medicine after the expiration date. NOTE: This sheet is a summary. It may not cover all possible information. If you have questions about this medicine, talk to your doctor, pharmacist, or health care provider.    2016, Elsevier/Gold Standard. (2008-01-11 22:10:20)

## 2016-04-30 ENCOUNTER — Encounter: Payer: Self-pay | Admitting: Internal Medicine

## 2016-04-30 ENCOUNTER — Other Ambulatory Visit: Payer: Self-pay | Admitting: *Deleted

## 2016-04-30 ENCOUNTER — Ambulatory Visit (HOSPITAL_BASED_OUTPATIENT_CLINIC_OR_DEPARTMENT_OTHER): Payer: Medicare Other

## 2016-04-30 ENCOUNTER — Other Ambulatory Visit (HOSPITAL_BASED_OUTPATIENT_CLINIC_OR_DEPARTMENT_OTHER): Payer: Medicare Other

## 2016-04-30 ENCOUNTER — Other Ambulatory Visit: Payer: Self-pay | Admitting: Internal Medicine

## 2016-04-30 ENCOUNTER — Ambulatory Visit (HOSPITAL_BASED_OUTPATIENT_CLINIC_OR_DEPARTMENT_OTHER): Payer: Medicare Other | Admitting: Internal Medicine

## 2016-04-30 VITALS — BP 138/60 | HR 70 | Temp 98.5°F | Resp 18 | Ht 65.0 in | Wt 133.1 lb

## 2016-04-30 DIAGNOSIS — R197 Diarrhea, unspecified: Secondary | ICD-10-CM

## 2016-04-30 DIAGNOSIS — Z5112 Encounter for antineoplastic immunotherapy: Secondary | ICD-10-CM

## 2016-04-30 DIAGNOSIS — C3492 Malignant neoplasm of unspecified part of left bronchus or lung: Secondary | ICD-10-CM

## 2016-04-30 DIAGNOSIS — Z86711 Personal history of pulmonary embolism: Secondary | ICD-10-CM | POA: Diagnosis not present

## 2016-04-30 DIAGNOSIS — C3411 Malignant neoplasm of upper lobe, right bronchus or lung: Secondary | ICD-10-CM

## 2016-04-30 DIAGNOSIS — Z95828 Presence of other vascular implants and grafts: Secondary | ICD-10-CM

## 2016-04-30 DIAGNOSIS — C3491 Malignant neoplasm of unspecified part of right bronchus or lung: Secondary | ICD-10-CM

## 2016-04-30 DIAGNOSIS — R530 Neoplastic (malignant) related fatigue: Secondary | ICD-10-CM

## 2016-04-30 DIAGNOSIS — E538 Deficiency of other specified B group vitamins: Secondary | ICD-10-CM

## 2016-04-30 DIAGNOSIS — R200 Anesthesia of skin: Secondary | ICD-10-CM

## 2016-04-30 LAB — COMPREHENSIVE METABOLIC PANEL
ALBUMIN: 4 g/dL (ref 3.5–5.0)
ALK PHOS: 85 U/L (ref 40–150)
ALT: 13 U/L (ref 0–55)
AST: 13 U/L (ref 5–34)
Anion Gap: 9 mEq/L (ref 3–11)
BILIRUBIN TOTAL: 0.35 mg/dL (ref 0.20–1.20)
BUN: 16.9 mg/dL (ref 7.0–26.0)
CO2: 23 meq/L (ref 22–29)
CREATININE: 1.4 mg/dL — AB (ref 0.6–1.1)
Calcium: 10.1 mg/dL (ref 8.4–10.4)
Chloride: 108 mEq/L (ref 98–109)
EGFR: 43 mL/min/{1.73_m2} — AB (ref >90)
GLUCOSE: 71 mg/dL (ref 70–140)
Potassium: 4.7 mEq/L (ref 3.5–5.1)
SODIUM: 140 meq/L (ref 136–145)
TOTAL PROTEIN: 7.4 g/dL (ref 6.4–8.3)

## 2016-04-30 LAB — CBC WITH DIFFERENTIAL/PLATELET
BASO%: 0.9 % (ref 0.0–2.0)
Basophils Absolute: 0.1 10*3/uL (ref 0.0–0.1)
EOS ABS: 0.3 10*3/uL (ref 0.0–0.5)
EOS%: 3.5 % (ref 0.0–7.0)
HCT: 39.6 % (ref 34.8–46.6)
HEMOGLOBIN: 12.7 g/dL (ref 11.6–15.9)
LYMPH#: 1.7 10*3/uL (ref 0.9–3.3)
LYMPH%: 20.2 % (ref 14.0–49.7)
MCH: 28.2 pg (ref 25.1–34.0)
MCHC: 32 g/dL (ref 31.5–36.0)
MCV: 88 fL (ref 79.5–101.0)
MONO#: 0.7 10*3/uL (ref 0.1–0.9)
MONO%: 8.3 % (ref 0.0–14.0)
NEUT#: 5.6 10*3/uL (ref 1.5–6.5)
NEUT%: 67.1 % (ref 38.4–76.8)
PLATELETS: 213 10*3/uL (ref 145–400)
RBC: 4.5 10*6/uL (ref 3.70–5.45)
RDW: 14.2 % (ref 11.2–14.5)
WBC: 8.4 10*3/uL (ref 3.9–10.3)

## 2016-04-30 LAB — TSH: TSH: 0.181 m[IU]/L — AB (ref 0.308–3.960)

## 2016-04-30 MED ORDER — NIVOLUMAB CHEMO INJECTION 100 MG/10ML
240.0000 mg | Freq: Once | INTRAVENOUS | Status: AC
  Administered 2016-04-30: 240 mg via INTRAVENOUS
  Filled 2016-04-30: qty 20

## 2016-04-30 MED ORDER — HEPARIN SOD (PORK) LOCK FLUSH 100 UNIT/ML IV SOLN
500.0000 [IU] | Freq: Once | INTRAVENOUS | Status: AC | PRN
  Administered 2016-04-30: 500 [IU]
  Filled 2016-04-30: qty 5

## 2016-04-30 MED ORDER — SODIUM CHLORIDE 0.9 % IV SOLN
Freq: Once | INTRAVENOUS | Status: AC
  Administered 2016-04-30: 10:00:00 via INTRAVENOUS

## 2016-04-30 MED ORDER — SODIUM CHLORIDE 0.9% FLUSH
10.0000 mL | INTRAVENOUS | Status: DC | PRN
  Administered 2016-04-30: 10 mL
  Filled 2016-04-30: qty 10

## 2016-04-30 NOTE — Progress Notes (Signed)
[facility] [phone]   [phone]  OFFICE PROGRESS NOTE  [name], NP [phone] [address]  DIAGNOSIS AND STAGE:  #1 Recurrent non-small cell lung cancer consistent with adenocarcinoma. This was initially diagnosed as stage IB (T2a N0 M0) well-differentiated adenocarcinoma of bronchioalveolar type in December 2010.  #2 Acute pulmonary embolism in the right lower lobe lobar, segmental and subsegmental sized pulmonary arteries, diagnosed on 11/04/2011.   PRIOR THERAPY:  1.Status post right upper lobectomy on September 28, 2009 under the care of Dr. Arlyce Dice. The patient refused adjuvant chemotherapy at that time. She had disease recurrence in June of 2012.  2.Status post 4 cycles of systemic chemotherapy with carboplatin for an AUC of 6 and paclitaxel at 200 mg per meter squared and Avastin at 15 mg/kg according to the ECOG protocol #5508.  3. Chemotherapy with Avastin 15 mg/kg according to the ECOG protocol #5508 status post 32 cycles. Last cycle was given on 06/30/2013 discontinued secondary to disease progression and persistent proteinuria. 4. Tarceva 150 mg by mouth daily, started 08/09/2013, status post 10 months of treatment. 5. Tarceva 100 mg by mouth daily started 07/02/2014, status post 19 months, discontinued secondary to disease progression.   CURRENT THERAPY: Immunotherapy with Nivolumab 240 MG IV every 2 weeks. First dose 02/20/2016. Status post 5 cycles.  CHEMOTHERAPY INTENT: Palliative.  CURRENT # OF CHEMOTHERAPY CYCLES: 6 CURRENT ANTIEMETICS: Compazine  CURRENT SMOKING STATUS: Non-smoker  ORAL CHEMOTHERAPY AND CONSENT: Tarceva and consent was signed 07/21/2013.  CURRENT BISPHOSPHONATES USE: None  PAIN MANAGEMENT: No pain  NARCOTICS INDUCED CONSTIPATION: No constipation.  LIVING WILL AND CODE STATUS: Full code  INTERVAL HISTORY: MALEIA WEEMS 75 y.o. female returns to the clinic today for follow-up  visit. The patient is feeling fine today with no specific complaints. She tolerated the last cycle of her treatment with immunotherapy with Nivolumab fairly well except for some itching on the back. She has no skin rash. She denied having any significant chest pain, shortness of breath, cough or hemoptysis. She denied having any weight loss or night sweats. She has no nausea or vomiting. She has no fever or chills. She is here today to start cycle #6 of her treatment.  MEDICAL HISTORY: Past Medical History  Diagnosis Date  . Hypertension   . Hypothyroidism   . Hypercholesterolemia   . Glaucoma   . Hip pain 09/02/2011  . Foot pain 09/02/2011  . Lung cancer ([facility]) 03/08/2011    recurrent  . Bilateral lung cancer ([facility]) 03/08/2011    recurrent  . Encounter for antineoplastic immunotherapy 02/14/2016  . Chronic fatigue 02/14/2016    ALLERGIES:  is allergic to cymbalta; fish allergy; amitriptyline; codeine; and sulfa antibiotics.   SURGICAL HISTORY:  Past Surgical History  Procedure Laterality Date  . Video assisted thoracoscopy  09/28/2009  . Thoracotomy  09/28/2009    mini  . Lung lobectomy  09/28/2009    right upper    REVIEW OF SYSTEMS:  A comprehensive review of systems was negative except for: Integument/breast: positive for pruritus   PHYSICAL EXAMINATION: General appearance: alert, cooperative, fatigued and no distress Head: Normocephalic, without obvious abnormality, atraumatic Neck: no adenopathy, no JVD, supple, symmetrical, trachea midline and thyroid not enlarged, symmetric, no tenderness/mass/nodules Lymph nodes: Cervical, supraclavicular, and axillary nodes normal. Resp: clear to auscultation bilaterally Back: symmetric, no curvature. ROM normal. No CVA tenderness. Cardio: regular rate and rhythm, S1, S2 normal, no murmur, click, rub or  gallop GI: soft, non-tender; bowel sounds normal; no masses,  no organomegaly Extremities: extremities normal, atraumatic, no cyanosis or  edema Neurologic: Alert and oriented X 3, normal strength and tone. Normal symmetric reflexes. Normal coordination and gait  ECOG PERFORMANCE STATUS: 1 - Symptomatic but completely ambulatory  Blood pressure 138/60, pulse 70, temperature 98.5 F (36.9 C), temperature source Oral, resp. rate 18, height '5\' 5"'$  (1.651 m), weight 133 lb 1.6 oz (60.374 kg), SpO2 100 %.  LABORATORY DATA: Lab Results  Component Value Date   WBC 8.1 04/17/2016   HGB 12.1 04/17/2016   HCT 36.9 04/17/2016   MCV 88.6 04/17/2016   PLT 214 04/17/2016      Chemistry      Component Value Date/Time   NA 140 04/17/2016 0844   NA 141 01/11/2015 1603   NA 142 03/08/2011 0754   K 4.1 04/17/2016 0844   K 3.6 01/11/2015 1603   K 4.2 03/08/2011 0754   CL 105 01/11/2015 1603   CL 107 03/30/2013 1036   CL 102 03/08/2011 0754   CO2 24 04/17/2016 0844   CO2 25 05/25/2014 0332   CO2 27 03/08/2011 0754   BUN 13.2 04/17/2016 0844   BUN 10 01/11/2015 1603   BUN 9 03/08/2011 0754   CREATININE 1.3* 04/17/2016 0844   CREATININE 1.20* 01/11/2015 1603   CREATININE 1.0 03/08/2011 0754      Component Value Date/Time   CALCIUM 9.7 04/17/2016 0844   CALCIUM 9.2 05/25/2014 0332   CALCIUM 9.5 03/08/2011 0754   ALKPHOS 80 04/17/2016 0844   ALKPHOS 67 05/25/2014 0332   ALKPHOS 59 03/08/2011 0754   AST 12 04/17/2016 0844   AST 17 05/25/2014 0332   AST 19 03/08/2011 0754   ALT 14 04/17/2016 0844   ALT 9 05/25/2014 0332   ALT 18 03/08/2011 0754   BILITOT <0.30 04/17/2016 0844   BILITOT 0.4 05/25/2014 0332   BILITOT 0.50 03/08/2011 0754       RADIOGRAPHIC STUDIES: Ct Chest W Contrast  04/15/2016  CLINICAL DATA:  Lung cancer diagnosed in 2010 with partial right upper lobectomy. Finished chemotherapy in 2014. Ex-smoker. anti neoplastic immunotherapy. Staging. Palpable abnormality posterior right shoulder. EXAM: CT CHEST WITH CONTRAST TECHNIQUE: Multidetector CT imaging of the chest was performed during intravenous contrast  administration. CONTRAST:  76m ISOVUE-300 IOPAMIDOL (ISOVUE-300) INJECTION 61% COMPARISON:  02/12/2016 FINDINGS: Cardiovascular: Aortic and branch vessel atherosclerosis. Normal heart size, without pericardial effusion. Atherosclerosis. No central pulmonary embolism, on this non-dedicated study. Mediastinum/Nodes: No supraclavicular adenopathy. Right Port-A-Cath terminates at the high right atrium. Calcified left hilar nodes. No mediastinal or hilar adenopathy. Tiny hiatal hernia. Lungs/Pleura: No pleural fluid. Status post right upper lobectomy. Centrilobular emphysema. Subpleural right lower lobe partially solid partially cystic nodule. The solid component measures 2.1 x 1.8 cm today versus 2.2 x 2.0 cm previously. The entire nodule measures maximally 3.2 cm today versus 3.1 cm previously. This suggests stability. Partially solid partially cystic right lower lobe lesion more laterally measures 2.2 x 1.9 cm on image 47/ series 5. Compare 2.5 x 1.9 cm previously. This suggests stability versus minimal decrease. Mixed solid and sub solid superior segment left lower lobe pulmonary mass measures 4.7 x 5.2 cm on image 57/series 5. Compare 7.4 x 5.0 cm previously. The most well-defined solid portion measures 2.5 x 2.7 cm on image 54/ series 5. Compare 3.0 x 2.8 cm previously. The previously described left upper lobe pulmonary nodule is nearly completely resolved. Only minimal interstitial thickening remains at  this site (image 48/series 5.) Other smaller primarily ground-glass nodules are not significantly changed. Upper Abdomen: Old granulomatous disease in the liver. Subcentimeter right hepatic lobe cyst. Old granulomatous disease in the spleen. Normal imaged portions of the pancreas, gallbladder, adrenal glands, kidneys. Abdominal aortic atherosclerosis. Musculoskeletal: No acute osseous abnormality. Palpable abnormality marked on image 24/series 2 without underlying solid or cystic mass. IMPRESSION: 1. Decreased  size of superior segment left lower lobe solid and sub solid pulmonary mass. 2. The solid left upper lobe pulmonary nodule is nearly completely resolved. 3. Similar to decreased size of other pulmonary lesions as detailed above. 4. No thoracic adenopathy. 5.  Coronary artery atherosclerosis. Aortic atherosclerosis. 6. No correlate for the palpable abnormality about the posterior right shoulder/chest wall. Electronically Signed   By: Abigail Miyamoto M.D.   On: 04/15/2016 10:12    ASSESSMENT AND PLAN: This is a very pleasant 75 years old African-American female with recurrent non-small cell lung cancer, adenocarcinoma presented with multiple pulmonary nodules currently on treatment with Tarceva 100 mg by mouth daily status post more than 19 months. She is tolerating her treatment fairly well with no significant adverse effects except for mild fatigue and few episodes of diarrhea. The recent CT scan of the chest showed some evidence for disease progression with enlarging left upper lobe nodule as well as enlarging multiple bilateral groundglass and semisolid lesions. I personally reviewed the images. I discussed the results with the patient today. I recommended for her to discontinue treatment with Tarceva at this point.  She is currently on treatment with immunotherapy with Nivolumab status post 5 cycles and tolerating her treatment well.  I recommended for the patient to proceed with cycle #6 today as scheduled. I will see her back for follow-up visit in 2 weeks for reevaluation before starting cycle #7. She was advised to call immediately if she has any concerning symptoms in the interval. The patient voices understanding of current disease status and treatment options and is in agreement with the current care plan.  All questions were answered. The patient knows to call the clinic with any problems, questions or concerns. We can certainly see the patient much sooner if necessary.  Disclaimer: This note  was dictated with voice recognition software. Similar sounding words can inadvertently be transcribed and may not be corrected upon review.

## 2016-04-30 NOTE — Progress Notes (Signed)
Pt came in to inquire about copay assistance for Opdivo.  I informed her [facility] has an adjuvant program to assist Medicare pt's but they don't pay past dates of service.  Pt stated, "if that's the case she would wait to see what her ins will pay because she has almost met her deductible at which they will pay 100%".  I offered to research foundations that has a back pay program but unfortunately there aren't any foundations offering copay assistance for her Dx.  I will inform the pt.

## 2016-05-08 ENCOUNTER — Encounter: Payer: Self-pay | Admitting: Internal Medicine

## 2016-05-08 ENCOUNTER — Telehealth: Payer: Self-pay | Admitting: *Deleted

## 2016-05-08 NOTE — Progress Notes (Signed)
Patient called and states she has copay assistance through [employer] and was told by them that her [name] would be covered through them. I asked patient who applied on her behalf. Patient states she applied herself after receiving paperwork from them in 2014 and she has renewed it each year. Patient also states she was told by them that Dr.Mohamed needs to contact [facility] to [facility] in RX for Obdivo. She left a message for his RN and I did as well to contact patient back to explain how this needs to be handled. Patient provided me with her id number for [employer]. I contacted them and spoke with [name] who informed me patient is covered and Obdivo would be covered as well. She will fax me a copy of the approval letter and provided me with the pharmacy group,bin,and PCN but could only send the pharmacy information out to the patient. I contacted patient and advised her once she receives information from them to bring a copy in for [name] to send to billing. I also informed her that I left a message for the RN as well. Patient verbalized understanding and thanked me for my prompt assistance with this matter. Fax received from [name] with approval letter. Emailing to [facility] in billing.

## 2016-05-08 NOTE — Telephone Encounter (Signed)
Call from pt who advised Opdivo is not being covered by insurance, pt requested a rx for Opdivo be sent to [facility]. When I was on Tarceva [employer] covered 100% of this medicine. [id number]#-[phone]. Pt is very concerns as her Insurance only covers 80% and I need some help. Discussed with pt

## 2016-05-14 ENCOUNTER — Other Ambulatory Visit (HOSPITAL_BASED_OUTPATIENT_CLINIC_OR_DEPARTMENT_OTHER): Payer: Medicare Other

## 2016-05-14 ENCOUNTER — Ambulatory Visit (HOSPITAL_BASED_OUTPATIENT_CLINIC_OR_DEPARTMENT_OTHER): Payer: Medicare Other | Admitting: Internal Medicine

## 2016-05-14 ENCOUNTER — Encounter: Payer: Self-pay | Admitting: Internal Medicine

## 2016-05-14 ENCOUNTER — Ambulatory Visit (HOSPITAL_BASED_OUTPATIENT_CLINIC_OR_DEPARTMENT_OTHER): Payer: Medicare Other

## 2016-05-14 ENCOUNTER — Ambulatory Visit: Payer: Medicare Other

## 2016-05-14 VITALS — BP 155/66 | HR 78 | Temp 98.2°F | Resp 18 | Wt 135.0 lb

## 2016-05-14 DIAGNOSIS — C3491 Malignant neoplasm of unspecified part of right bronchus or lung: Secondary | ICD-10-CM | POA: Diagnosis not present

## 2016-05-14 DIAGNOSIS — R197 Diarrhea, unspecified: Secondary | ICD-10-CM

## 2016-05-14 DIAGNOSIS — C3492 Malignant neoplasm of unspecified part of left bronchus or lung: Secondary | ICD-10-CM

## 2016-05-14 DIAGNOSIS — Z95828 Presence of other vascular implants and grafts: Secondary | ICD-10-CM

## 2016-05-14 DIAGNOSIS — R200 Anesthesia of skin: Secondary | ICD-10-CM

## 2016-05-14 DIAGNOSIS — Z5112 Encounter for antineoplastic immunotherapy: Secondary | ICD-10-CM

## 2016-05-14 DIAGNOSIS — Z79899 Other long term (current) drug therapy: Secondary | ICD-10-CM | POA: Diagnosis not present

## 2016-05-14 DIAGNOSIS — C3411 Malignant neoplasm of upper lobe, right bronchus or lung: Secondary | ICD-10-CM

## 2016-05-14 DIAGNOSIS — E538 Deficiency of other specified B group vitamins: Secondary | ICD-10-CM

## 2016-05-14 DIAGNOSIS — R530 Neoplastic (malignant) related fatigue: Secondary | ICD-10-CM

## 2016-05-14 DIAGNOSIS — Z86711 Personal history of pulmonary embolism: Secondary | ICD-10-CM

## 2016-05-14 LAB — CBC WITH DIFFERENTIAL/PLATELET
BASO%: 0.5 % (ref 0.0–2.0)
BASOS ABS: 0 10*3/uL (ref 0.0–0.1)
EOS%: 3.7 % (ref 0.0–7.0)
Eosinophils Absolute: 0.3 10*3/uL (ref 0.0–0.5)
HCT: 35 % (ref 34.8–46.6)
HGB: 11.8 g/dL (ref 11.6–15.9)
LYMPH%: 23.2 % (ref 14.0–49.7)
MCH: 28.9 pg (ref 25.1–34.0)
MCHC: 33.7 g/dL (ref 31.5–36.0)
MCV: 85.6 fL (ref 79.5–101.0)
MONO#: 0.7 10*3/uL (ref 0.1–0.9)
MONO%: 9.1 % (ref 0.0–14.0)
NEUT#: 5 10*3/uL (ref 1.5–6.5)
NEUT%: 63.5 % (ref 38.4–76.8)
NRBC: 0 % (ref 0–0)
Platelets: 203 10*3/uL (ref 145–400)
RBC: 4.09 10*6/uL (ref 3.70–5.45)
RDW: 14.1 % (ref 11.2–14.5)
WBC: 7.9 10*3/uL (ref 3.9–10.3)
lymph#: 1.8 10*3/uL (ref 0.9–3.3)

## 2016-05-14 LAB — COMPREHENSIVE METABOLIC PANEL
ALT: 13 U/L (ref 0–55)
ANION GAP: 8 meq/L (ref 3–11)
AST: 13 U/L (ref 5–34)
Albumin: 3.8 g/dL (ref 3.5–5.0)
Alkaline Phosphatase: 83 U/L (ref 40–150)
BUN: 13.1 mg/dL (ref 7.0–26.0)
CHLORIDE: 109 meq/L (ref 98–109)
CO2: 22 meq/L (ref 22–29)
Calcium: 10.1 mg/dL (ref 8.4–10.4)
Creatinine: 1.1 mg/dL (ref 0.6–1.1)
EGFR: 55 mL/min/{1.73_m2} — AB (ref >90)
GLUCOSE: 83 mg/dL (ref 70–140)
POTASSIUM: 4 meq/L (ref 3.5–5.1)
SODIUM: 140 meq/L (ref 136–145)
Total Bilirubin: 0.36 mg/dL (ref 0.20–1.20)
Total Protein: 7.2 g/dL (ref 6.4–8.3)

## 2016-05-14 LAB — TSH: TSH: 0.121 m[IU]/L — AB (ref 0.308–3.960)

## 2016-05-14 MED ORDER — SODIUM CHLORIDE 0.9% FLUSH
10.0000 mL | INTRAVENOUS | Status: DC | PRN
Start: 2016-05-14 — End: 2016-05-14
  Administered 2016-05-14: 10 mL
  Filled 2016-05-14: qty 10

## 2016-05-14 MED ORDER — SODIUM CHLORIDE 0.9 % IV SOLN
Freq: Once | INTRAVENOUS | Status: AC
  Administered 2016-05-14: 12:00:00 via INTRAVENOUS

## 2016-05-14 MED ORDER — SODIUM CHLORIDE 0.9 % IV SOLN
240.0000 mg | Freq: Once | INTRAVENOUS | Status: AC
  Administered 2016-05-14: 240 mg via INTRAVENOUS
  Filled 2016-05-14: qty 4

## 2016-05-14 MED ORDER — HEPARIN SOD (PORK) LOCK FLUSH 100 UNIT/ML IV SOLN
500.0000 [IU] | Freq: Once | INTRAVENOUS | Status: AC | PRN
  Administered 2016-05-14: 500 [IU]
  Filled 2016-05-14: qty 5

## 2016-05-14 NOTE — Patient Instructions (Signed)
[facility] Discharge Instructions for Patients Receiving Chemotherapy  Today you received the following chemotherapy agents Nivolumab. To help prevent nausea and vomiting after your treatment, we encourage you to take your nausea medication as directed.   If you develop nausea and vomiting that is not controlled by your nausea medication, call the clinic.   BELOW ARE SYMPTOMS THAT SHOULD BE REPORTED IMMEDIATELY:  *FEVER GREATER THAN 100.5 F  *CHILLS WITH OR WITHOUT FEVER  NAUSEA AND VOMITING THAT IS NOT CONTROLLED WITH YOUR NAUSEA MEDICATION  *UNUSUAL SHORTNESS OF BREATH  *UNUSUAL BRUISING OR BLEEDING  TENDERNESS IN MOUTH AND THROAT WITH OR WITHOUT PRESENCE OF ULCERS  *URINARY PROBLEMS  *BOWEL PROBLEMS  UNUSUAL RASH Items with * indicate a potential emergency and should be followed up as soon as possible.  Feel free to call the clinic you have any questions or concerns. The clinic phone number is [phone].  Please show the [facility] at check-in to the Emergency Department and triage nurse.  Cyanocobalamin, Vitamin B12 injection What is this medicine? CYANOCOBALAMIN (sye an oh koe BAL a min) is a man made form of vitamin B12. Vitamin B12 is used in the growth of healthy blood cells, nerve cells, and proteins in the body. It also helps with the metabolism of fats and carbohydrates. This medicine is used to treat people who can not absorb vitamin B12. This medicine may be used for other purposes; ask your health care provider or pharmacist if you have questions. What should I tell my health care provider before I take this medicine? They need to know if you have any of these conditions: -kidney disease -Leber's disease -megaloblastic anemia -an unusual or allergic reaction to cyanocobalamin, cobalt, other medicines, foods, dyes, or preservatives -pregnant or trying to get pregnant -breast-feeding How should I use this medicine? This medicine is  injected into a muscle or deeply under the skin. It is usually given by a health care professional in a clinic or doctor's office. However, your doctor may teach you how to inject yourself. Follow all instructions. Talk to your pediatrician regarding the use of this medicine in children. Special care may be needed. Overdosage: If you think you have taken too much of this medicine contact a poison control center or emergency room at once. NOTE: This medicine is only for you. Do not share this medicine with others. What if I miss a dose? If you are given your dose at a clinic or doctor's office, call to reschedule your appointment. If you give your own injections and you miss a dose, take it as soon as you can. If it is almost time for your next dose, take only that dose. Do not take double or extra doses. What may interact with this medicine? -colchicine -heavy alcohol intake This list may not describe all possible interactions. Give your health care provider a list of all the medicines, herbs, non-prescription drugs, or dietary supplements you use. Also tell them if you smoke, drink alcohol, or use illegal drugs. Some items may interact with your medicine. What should I watch for while using this medicine? Visit your doctor or health care professional regularly. You may need blood work done while you are taking this medicine. You may need to follow a special diet. Talk to your doctor. Limit your alcohol intake and avoid smoking to get the best benefit. What side effects may I notice from receiving this medicine? Side effects that you should report to your doctor or  health care professional as soon as possible: -allergic reactions like skin rash, itching or hives, swelling of the face, lips, or tongue -blue tint to skin -chest tightness, pain -difficulty breathing, wheezing -dizziness -red, swollen painful area on the leg Side effects that usually do not require medical attention (report to your  doctor or health care professional if they continue or are bothersome): -diarrhea -headache This list may not describe all possible side effects. Call your doctor for medical advice about side effects. You may report side effects to FDA at 1-800-FDA-1088. Where should I keep my medicine? Keep out of the reach of children. Store at room temperature between 15 and 30 degrees C (59 and 85 degrees F). Protect from light. Throw away any unused medicine after the expiration date. NOTE: This sheet is a summary. It may not cover all possible information. If you have questions about this medicine, talk to your doctor, pharmacist, or health care provider.    2016, Elsevier/Gold Standard. (2008-01-11 22:10:20)

## 2016-05-14 NOTE — Progress Notes (Signed)
Injections to be given In Infusion Room

## 2016-05-14 NOTE — Progress Notes (Signed)
[name]   [phone]  OFFICE PROGRESS NOTE  Terri Levels, NP [phone] [place] [address]  DIAGNOSIS AND STAGE:  #1 Recurrent non-small cell lung cancer consistent with adenocarcinoma. This was initially diagnosed as stage IB (T2a N0 M0) well-differentiated adenocarcinoma of bronchioalveolar type in December 2010.  #2 Acute pulmonary embolism in the right lower lobe lobar, segmental and subsegmental sized pulmonary arteries, diagnosed on 11/04/2011.   PRIOR THERAPY:  1.Status post right upper lobectomy on September 28, 2009 under the care of Dr. Arlyce Dice. The patient refused adjuvant chemotherapy at that time. She had disease recurrence in June of 2012.  2.Status post 4 cycles of systemic chemotherapy with carboplatin for an AUC of 6 and paclitaxel at 200 mg per meter squared and Avastin at 15 mg/kg according to the ECOG protocol #5508.  3. Chemotherapy with Avastin 15 mg/kg according to the ECOG protocol #5508 status post 32 cycles. Last cycle was given on 06/30/2013 discontinued secondary to disease progression and persistent proteinuria. 4. Tarceva 150 mg by mouth daily, started 08/09/2013, status post 10 months of treatment. 5. Tarceva 100 mg by mouth daily started 07/02/2014, status post 19 months, discontinued secondary to disease progression.   CURRENT THERAPY: Immunotherapy with Nivolumab 240 MG IV every 2 weeks. First dose 02/20/2016. Status post 6 cycles.  CHEMOTHERAPY INTENT: Palliative.  CURRENT # OF CHEMOTHERAPY CYCLES: 7 CURRENT ANTIEMETICS: Compazine  CURRENT SMOKING STATUS: Non-smoker  ORAL CHEMOTHERAPY AND CONSENT: Tarceva and consent was signed 07/21/2013.  CURRENT BISPHOSPHONATES USE: None  PAIN MANAGEMENT: No pain  NARCOTICS INDUCED CONSTIPATION: No constipation.  LIVING WILL AND CODE STATUS: Full code  INTERVAL HISTORY: [name] 75 y.o. female returns to the clinic today for follow-up  visit. The patient is feeling fine today with no specific complaints. She tolerated the last cycle of her treatment with immunotherapy with Nivolumab fairly well except for some itching on the back improved with hydrocortisone cream. She has no skin rash. She denied having any significant chest pain, shortness of breath, cough or hemoptysis. She denied having any weight loss or night sweats. She has no nausea or vomiting. She has no fever or chills. She is here today to start cycle #7 of her treatment. She was able to get financial support for her copayment for the treatment with Nivolumab via one of the assistance programs.  MEDICAL HISTORY: Past Medical History:  Diagnosis Date  . Bilateral lung cancer ([facility]) 03/08/2011   recurrent  . Chronic fatigue 02/14/2016  . Encounter for antineoplastic immunotherapy 02/14/2016  . Foot pain 09/02/2011  . Glaucoma   . Hip pain 09/02/2011  . Hypercholesterolemia   . Hypertension   . Hypothyroidism   . Lung cancer ([facility]) 03/08/2011   recurrent    ALLERGIES:  is allergic to cymbalta [duloxetine hcl]; fish allergy; amitriptyline; codeine; and sulfa antibiotics.   SURGICAL HISTORY:  Past Surgical History:  Procedure Laterality Date  . LUNG LOBECTOMY  09/28/2009   right upper  . THORACOTOMY  09/28/2009   mini  . VIDEO ASSISTED THORACOSCOPY  09/28/2009    REVIEW OF SYSTEMS:  A comprehensive review of systems was negative except for: Integument/breast: positive for pruritus   PHYSICAL EXAMINATION: General appearance: alert, cooperative, fatigued and no distress Head: Normocephalic, without obvious abnormality, atraumatic Neck: no adenopathy, no JVD, supple, symmetrical, trachea midline and thyroid not enlarged, symmetric, no tenderness/mass/nodules Lymph nodes: Cervical, supraclavicular, and axillary nodes normal. Resp: clear to auscultation  bilaterally Back: symmetric, no curvature. ROM normal. No CVA tenderness. Cardio: regular rate and rhythm, S1,  S2 normal, no murmur, click, rub or gallop GI: soft, non-tender; bowel sounds normal; no masses,  no organomegaly Extremities: extremities normal, atraumatic, no cyanosis or edema Neurologic: Alert and oriented X 3, normal strength and tone. Normal symmetric reflexes. Normal coordination and gait  ECOG PERFORMANCE STATUS: 1 - Symptomatic but completely ambulatory  Blood pressure (!) 155/66, pulse 78, temperature 98.2 F (36.8 C), temperature source Oral, resp. rate 18, weight 135 lb (61.2 kg), SpO2 100 %.  LABORATORY DATA: Lab Results  Component Value Date   WBC 8.4 04/30/2016   HGB 12.7 04/30/2016   HCT 39.6 04/30/2016   MCV 88.0 04/30/2016   PLT 213 04/30/2016      Chemistry      Component Value Date/Time   NA 140 04/30/2016 1039   K 4.7 04/30/2016 1039   CL 105 01/11/2015 1603   CL 107 03/30/2013 1036   CO2 23 04/30/2016 1039   BUN 16.9 04/30/2016 1039   CREATININE 1.4 (H) 04/30/2016 1039      Component Value Date/Time   CALCIUM 10.1 04/30/2016 1039   ALKPHOS 85 04/30/2016 1039   AST 13 04/30/2016 1039   ALT 13 04/30/2016 1039   BILITOT 0.35 04/30/2016 1039       RADIOGRAPHIC STUDIES: Ct Chest W Contrast  Result Date: 04/15/2016 CLINICAL DATA:  Lung cancer diagnosed in 2010 with partial right upper lobectomy. Finished chemotherapy in 2014. Ex-smoker. anti neoplastic immunotherapy. Staging. Palpable abnormality posterior right shoulder. EXAM: CT CHEST WITH CONTRAST TECHNIQUE: Multidetector CT imaging of the chest was performed during intravenous contrast administration. CONTRAST:  33m ISOVUE-300 IOPAMIDOL (ISOVUE-300) INJECTION 61% COMPARISON:  02/12/2016 FINDINGS: Cardiovascular: Aortic and branch vessel atherosclerosis. Normal heart size, without pericardial effusion. Atherosclerosis. No central pulmonary embolism, on this non-dedicated study. Mediastinum/Nodes: No supraclavicular adenopathy. Right Port-A-Cath terminates at the high right atrium. Calcified left hilar  nodes. No mediastinal or hilar adenopathy. Tiny hiatal hernia. Lungs/Pleura: No pleural fluid. Status post right upper lobectomy. Centrilobular emphysema. Subpleural right lower lobe partially solid partially cystic nodule. The solid component measures 2.1 x 1.8 cm today versus 2.2 x 2.0 cm previously. The entire nodule measures maximally 3.2 cm today versus 3.1 cm previously. This suggests stability. Partially solid partially cystic right lower lobe lesion more laterally measures 2.2 x 1.9 cm on image 47/ series 5. Compare 2.5 x 1.9 cm previously. This suggests stability versus minimal decrease. Mixed solid and sub solid superior segment left lower lobe pulmonary mass measures 4.7 x 5.2 cm on image 57/series 5. Compare 7.4 x 5.0 cm previously. The most well-defined solid portion measures 2.5 x 2.7 cm on image 54/ series 5. Compare 3.0 x 2.8 cm previously. The previously described left upper lobe pulmonary nodule is nearly completely resolved. Only minimal interstitial thickening remains at this site (image 48/series 5.) Other smaller primarily ground-glass nodules are not significantly changed. Upper Abdomen: Old granulomatous disease in the liver. Subcentimeter right hepatic lobe cyst. Old granulomatous disease in the spleen. Normal imaged portions of the pancreas, gallbladder, adrenal glands, kidneys. Abdominal aortic atherosclerosis. Musculoskeletal: No acute osseous abnormality. Palpable abnormality marked on image 24/series 2 without underlying solid or cystic mass. IMPRESSION: 1. Decreased size of superior segment left lower lobe solid and sub solid pulmonary mass. 2. The solid left upper lobe pulmonary nodule is nearly completely resolved. 3. Similar to decreased size of other pulmonary lesions as detailed above. 4. No thoracic  adenopathy. 5.  Coronary artery atherosclerosis. Aortic atherosclerosis. 6. No correlate for the palpable abnormality about the posterior right shoulder/chest wall. Electronically  Signed   By: Abigail Miyamoto M.D.   On: 04/15/2016 10:12    ASSESSMENT AND PLAN: This is a very pleasant 75 years old African-American female with recurrent non-small cell lung cancer, adenocarcinoma presented with multiple pulmonary nodules currently on treatment with Tarceva 100 mg by mouth daily status post more than 19 months. She is tolerating her treatment fairly well with no significant adverse effects except for mild fatigue and few episodes of diarrhea. The recent CT scan of the chest showed some evidence for disease progression with enlarging left upper lobe nodule as well as enlarging multiple bilateral groundglass and semisolid lesions. I personally reviewed the images. I discussed the results with the patient today. I recommended for her to discontinue treatment with Tarceva at this point.  She is currently on treatment with immunotherapy with Nivolumab status post 6 cycles and tolerating her treatment well.  I recommended for the patient to proceed with cycle #7 today as scheduled. I will see her back for follow-up visit in 2 weeks for reevaluation before starting cycle #8. She was advised to call immediately if she has any concerning symptoms in the interval. The patient voices understanding of current disease status and treatment options and is in agreement with the current care plan.  All questions were answered. The patient knows to call the clinic with any problems, questions or concerns. We can certainly see the patient much sooner if necessary.  Disclaimer: This note was dictated with voice recognition software. Similar sounding words can inadvertently be transcribed and may not be corrected upon review.

## 2016-05-28 ENCOUNTER — Other Ambulatory Visit (HOSPITAL_BASED_OUTPATIENT_CLINIC_OR_DEPARTMENT_OTHER): Payer: Medicare Other

## 2016-05-28 ENCOUNTER — Ambulatory Visit (HOSPITAL_BASED_OUTPATIENT_CLINIC_OR_DEPARTMENT_OTHER): Payer: Medicare Other

## 2016-05-28 ENCOUNTER — Ambulatory Visit (HOSPITAL_BASED_OUTPATIENT_CLINIC_OR_DEPARTMENT_OTHER): Payer: Medicare Other | Admitting: Internal Medicine

## 2016-05-28 ENCOUNTER — Telehealth: Payer: Self-pay | Admitting: Internal Medicine

## 2016-05-28 ENCOUNTER — Encounter: Payer: Self-pay | Admitting: Internal Medicine

## 2016-05-28 VITALS — BP 145/68 | HR 72 | Temp 97.8°F | Resp 18 | Ht 65.0 in | Wt 134.9 lb

## 2016-05-28 DIAGNOSIS — R200 Anesthesia of skin: Secondary | ICD-10-CM

## 2016-05-28 DIAGNOSIS — C3492 Malignant neoplasm of unspecified part of left bronchus or lung: Secondary | ICD-10-CM

## 2016-05-28 DIAGNOSIS — Z5111 Encounter for antineoplastic chemotherapy: Secondary | ICD-10-CM | POA: Diagnosis not present

## 2016-05-28 DIAGNOSIS — E538 Deficiency of other specified B group vitamins: Secondary | ICD-10-CM

## 2016-05-28 DIAGNOSIS — R530 Neoplastic (malignant) related fatigue: Secondary | ICD-10-CM

## 2016-05-28 DIAGNOSIS — Z86711 Personal history of pulmonary embolism: Secondary | ICD-10-CM | POA: Diagnosis not present

## 2016-05-28 DIAGNOSIS — C3491 Malignant neoplasm of unspecified part of right bronchus or lung: Secondary | ICD-10-CM

## 2016-05-28 DIAGNOSIS — Z5112 Encounter for antineoplastic immunotherapy: Secondary | ICD-10-CM

## 2016-05-28 LAB — CBC WITH DIFFERENTIAL/PLATELET
BASO%: 0.5 % (ref 0.0–2.0)
BASOS ABS: 0 10*3/uL (ref 0.0–0.1)
EOS ABS: 0.3 10*3/uL (ref 0.0–0.5)
EOS%: 3.3 % (ref 0.0–7.0)
HEMATOCRIT: 37.2 % (ref 34.8–46.6)
HGB: 12.4 g/dL (ref 11.6–15.9)
LYMPH%: 21.5 % (ref 14.0–49.7)
MCH: 28.8 pg (ref 25.1–34.0)
MCHC: 33.3 g/dL (ref 31.5–36.0)
MCV: 86.5 fL (ref 79.5–101.0)
MONO#: 0.6 10*3/uL (ref 0.1–0.9)
MONO%: 7 % (ref 0.0–14.0)
NEUT#: 5.7 10*3/uL (ref 1.5–6.5)
NEUT%: 67.7 % (ref 38.4–76.8)
NRBC: 0 % (ref 0–0)
PLATELETS: 210 10*3/uL (ref 145–400)
RBC: 4.3 10*6/uL (ref 3.70–5.45)
RDW: 14.7 % — AB (ref 11.2–14.5)
WBC: 8.4 10*3/uL (ref 3.9–10.3)
lymph#: 1.8 10*3/uL (ref 0.9–3.3)

## 2016-05-28 LAB — COMPREHENSIVE METABOLIC PANEL
ALK PHOS: 79 U/L (ref 40–150)
ALT: 12 U/L (ref 0–55)
ANION GAP: 8 meq/L (ref 3–11)
AST: 12 U/L (ref 5–34)
Albumin: 3.8 g/dL (ref 3.5–5.0)
BILIRUBIN TOTAL: 0.41 mg/dL (ref 0.20–1.20)
BUN: 16 mg/dL (ref 7.0–26.0)
CALCIUM: 10.3 mg/dL (ref 8.4–10.4)
CO2: 25 mEq/L (ref 22–29)
Chloride: 107 mEq/L (ref 98–109)
Creatinine: 1.2 mg/dL — ABNORMAL HIGH (ref 0.6–1.1)
EGFR: 49 mL/min/{1.73_m2} — AB (ref >90)
GLUCOSE: 90 mg/dL (ref 70–140)
POTASSIUM: 4 meq/L (ref 3.5–5.1)
Sodium: 141 mEq/L (ref 136–145)
Total Protein: 7.4 g/dL (ref 6.4–8.3)

## 2016-05-28 MED ORDER — SODIUM CHLORIDE 0.9 % IV SOLN
Freq: Once | INTRAVENOUS | Status: AC
  Administered 2016-05-28: 12:00:00 via INTRAVENOUS

## 2016-05-28 MED ORDER — SODIUM CHLORIDE 0.9 % IV SOLN
240.0000 mg | Freq: Once | INTRAVENOUS | Status: AC
  Administered 2016-05-28: 240 mg via INTRAVENOUS
  Filled 2016-05-28: qty 4

## 2016-05-28 MED ORDER — CYANOCOBALAMIN 1000 MCG/ML IJ SOLN
INTRAMUSCULAR | Status: AC
  Filled 2016-05-28: qty 1

## 2016-05-28 MED ORDER — SODIUM CHLORIDE 0.9% FLUSH
10.0000 mL | INTRAVENOUS | Status: DC | PRN
  Administered 2016-05-28: 10 mL
  Filled 2016-05-28: qty 10

## 2016-05-28 MED ORDER — HEPARIN SOD (PORK) LOCK FLUSH 100 UNIT/ML IV SOLN
500.0000 [IU] | Freq: Once | INTRAVENOUS | Status: AC | PRN
  Administered 2016-05-28: 500 [IU]
  Filled 2016-05-28: qty 5

## 2016-05-28 MED ORDER — CYANOCOBALAMIN 1000 MCG/ML IJ SOLN
1000.0000 ug | Freq: Once | INTRAMUSCULAR | Status: AC
  Administered 2016-05-28: 1000 ug via INTRAMUSCULAR

## 2016-05-28 NOTE — Telephone Encounter (Signed)
GAVE PATIENT AVS REPORT AND APPOINTMENTS FOR AUGUST AND September. [facility] WILL CALL RE SCAN - PATIENT AWARE. PATIENT ALREADY ON SCHEDULE FOR LAB/FU/TX IN TWO WEEKS 8/29 AND ALSO 9/12.Marland Kitchen MOVED INJ FROM 8/29 TO 9/12 DUE TO PER LOS INJ TODAY AND MTHLY.

## 2016-05-28 NOTE — Patient Instructions (Signed)
[facility] Discharge Instructions for Patients Receiving Chemotherapy  Today you received the following chemotherapy agents Nivolumab. To help prevent nausea and vomiting after your treatment, we encourage you to take your nausea medication as directed.   If you develop nausea and vomiting that is not controlled by your nausea medication, call the clinic.   BELOW ARE SYMPTOMS THAT SHOULD BE REPORTED IMMEDIATELY:  *FEVER GREATER THAN 100.5 F  *CHILLS WITH OR WITHOUT FEVER  NAUSEA AND VOMITING THAT IS NOT CONTROLLED WITH YOUR NAUSEA MEDICATION  *UNUSUAL SHORTNESS OF BREATH  *UNUSUAL BRUISING OR BLEEDING  TENDERNESS IN MOUTH AND THROAT WITH OR WITHOUT PRESENCE OF ULCERS  *URINARY PROBLEMS  *BOWEL PROBLEMS  UNUSUAL RASH Items with * indicate a potential emergency and should be followed up as soon as possible.  Feel free to call the clinic you have any questions or concerns. The clinic phone number is [phone].  Please show the [facility] at check-in to the Emergency Department and triage nurse.  Cyanocobalamin, Vitamin B12 injection What is this medicine? CYANOCOBALAMIN (sye an oh koe BAL a min) is a man made form of vitamin B12. Vitamin B12 is used in the growth of healthy blood cells, nerve cells, and proteins in the body. It also helps with the metabolism of fats and carbohydrates. This medicine is used to treat people who can not absorb vitamin B12. This medicine may be used for other purposes; ask your health care provider or pharmacist if you have questions. What should I tell my health care provider before I take this medicine? They need to know if you have any of these conditions: -kidney disease -Leber's disease -megaloblastic anemia -an unusual or allergic reaction to cyanocobalamin, cobalt, other medicines, foods, dyes, or preservatives -pregnant or trying to get pregnant -breast-feeding How should I use this medicine? This medicine is  injected into a muscle or deeply under the skin. It is usually given by a health care professional in a clinic or doctor's office. However, your doctor may teach you how to inject yourself. Follow all instructions. Talk to your pediatrician regarding the use of this medicine in children. Special care may be needed. Overdosage: If you think you have taken too much of this medicine contact a poison control center or emergency room at once. NOTE: This medicine is only for you. Do not share this medicine with others. What if I miss a dose? If you are given your dose at a clinic or doctor's office, call to reschedule your appointment. If you give your own injections and you miss a dose, take it as soon as you can. If it is almost time for your next dose, take only that dose. Do not take double or extra doses. What may interact with this medicine? -colchicine -heavy alcohol intake This list may not describe all possible interactions. Give your health care provider a list of all the medicines, herbs, non-prescription drugs, or dietary supplements you use. Also tell them if you smoke, drink alcohol, or use illegal drugs. Some items may interact with your medicine. What should I watch for while using this medicine? Visit your doctor or health care professional regularly. You may need blood work done while you are taking this medicine. You may need to follow a special diet. Talk to your doctor. Limit your alcohol intake and avoid smoking to get the best benefit. What side effects may I notice from receiving this medicine? Side effects that you should report to your doctor or  health care professional as soon as possible: -allergic reactions like skin rash, itching or hives, swelling of the face, lips, or tongue -blue tint to skin -chest tightness, pain -difficulty breathing, wheezing -dizziness -red, swollen painful area on the leg Side effects that usually do not require medical attention (report to your  doctor or health care professional if they continue or are bothersome): -diarrhea -headache This list may not describe all possible side effects. Call your doctor for medical advice about side effects. You may report side effects to FDA at 1-800-FDA-1088. Where should I keep my medicine? Keep out of the reach of children. Store at room temperature between 15 and 30 degrees C (59 and 85 degrees F). Protect from light. Throw away any unused medicine after the expiration date. NOTE: This sheet is a summary. It may not cover all possible information. If you have questions about this medicine, talk to your doctor, pharmacist, or health care provider.    2016, Elsevier/Gold Standard. (2008-01-11 22:10:20)

## 2016-05-28 NOTE — Progress Notes (Signed)
[facility] Telephone:([phone]   Fax:(336) [phone]  OFFICE PROGRESS NOTE  [name], NP [phone] [place] [address]  DIAGNOSIS AND STAGE:  #1 Recurrent non-small cell lung cancer consistent with adenocarcinoma. This was initially diagnosed as stage IB (T2a N0 M0) well-differentiated adenocarcinoma of bronchioalveolar type in December 2010.  #2 Acute pulmonary embolism in the right lower lobe lobar, segmental and subsegmental sized pulmonary arteries, diagnosed on 11/04/2011.   PRIOR THERAPY:  1.Status post right upper lobectomy on September 28, 2009 under the care of Dr. Arlyce Dice. The patient refused adjuvant chemotherapy at that time. She had disease recurrence in June of 2012.  2.Status post 4 cycles of systemic chemotherapy with carboplatin for an AUC of 6 and paclitaxel at 200 mg per meter squared and Avastin at 15 mg/kg according to the ECOG protocol #5508.  3. Chemotherapy with Avastin 15 mg/kg according to the ECOG protocol #5508 status post 32 cycles. Last cycle was given on 06/30/2013 discontinued secondary to disease progression and persistent proteinuria. 4. Tarceva 150 mg by mouth daily, started 08/09/2013, status post 10 months of treatment. 5. Tarceva 100 mg by mouth daily started 07/02/2014, status post 19 months, discontinued secondary to disease progression.   CURRENT THERAPY: Immunotherapy with Nivolumab 240 MG IV every 2 weeks. First dose 02/20/2016. Status post 7 cycles.  CHEMOTHERAPY INTENT: Palliative.  CURRENT # OF CHEMOTHERAPY CYCLES: 8 CURRENT ANTIEMETICS: Compazine  CURRENT SMOKING STATUS: Non-smoker  ORAL CHEMOTHERAPY AND CONSENT: Tarceva and consent was signed 07/21/2013.  CURRENT BISPHOSPHONATES USE: None  PAIN MANAGEMENT: No pain  NARCOTICS INDUCED CONSTIPATION: No constipation.  LIVING WILL AND CODE STATUS: Full code  INTERVAL HISTORY: [name] 75 y.o. female returns to the clinic today for follow-up  visit. The patient is feeling fine today with no specific complaints. She tolerated the last cycle of her treatment with immunotherapy with Nivolumab fairly well. She has no skin rash. She denied having any significant chest pain, shortness of breath, cough or hemoptysis. She denied having any weight loss or night sweats. She has no nausea or vomiting. She has no fever or chills. She is here today to start cycle #8 of her treatment.   MEDICAL HISTORY: Past Medical History:  Diagnosis Date  . Bilateral lung cancer ([facility]) 03/08/2011   recurrent  . Chronic fatigue 02/14/2016  . Encounter for antineoplastic immunotherapy 02/14/2016  . Foot pain 09/02/2011  . Glaucoma   . Hip pain 09/02/2011  . Hypercholesterolemia   . Hypertension   . Hypothyroidism   . Lung cancer ([facility]) 03/08/2011   recurrent    ALLERGIES:  is allergic to cymbalta [duloxetine hcl]; fish allergy; amitriptyline; codeine; and sulfa antibiotics.   SURGICAL HISTORY:  Past Surgical History:  Procedure Laterality Date  . LUNG LOBECTOMY  09/28/2009   right upper  . THORACOTOMY  09/28/2009   mini  . VIDEO ASSISTED THORACOSCOPY  09/28/2009    REVIEW OF SYSTEMS:  A comprehensive review of systems was negative.   PHYSICAL EXAMINATION: General appearance: alert, cooperative, fatigued and no distress Head: Normocephalic, without obvious abnormality, atraumatic Neck: no adenopathy, no JVD, supple, symmetrical, trachea midline and thyroid not enlarged, symmetric, no tenderness/mass/nodules Lymph nodes: Cervical, supraclavicular, and axillary nodes normal. Resp: clear to auscultation bilaterally Back: symmetric, no curvature. ROM normal. No CVA tenderness. Cardio: regular rate and rhythm, S1, S2 normal, no murmur, click, rub or gallop GI: soft, non-tender; bowel sounds normal; no masses,  no organomegaly Extremities: extremities  normal, atraumatic, no cyanosis or edema Neurologic: Alert and oriented X 3, normal strength and tone.  Normal symmetric reflexes. Normal coordination and gait  ECOG PERFORMANCE STATUS: 1 - Symptomatic but completely ambulatory  Blood pressure (!) 145/68, pulse 72, temperature 97.8 F (36.6 C), temperature source Oral, resp. rate 18, height '5\' 5"'$  (1.651 m), weight 134 lb 14.4 oz (61.2 kg), SpO2 100 %.  LABORATORY DATA: Lab Results  Component Value Date   WBC 8.4 05/28/2016   HGB 12.4 05/28/2016   HCT 37.2 05/28/2016   MCV 86.5 05/28/2016   PLT 210 05/28/2016      Chemistry      Component Value Date/Time   NA 141 05/28/2016 0955   K 4.0 05/28/2016 0955   CL 105 01/11/2015 1603   CL 107 03/30/2013 1036   CO2 25 05/28/2016 0955   BUN 16.0 05/28/2016 0955   CREATININE 1.2 (H) 05/28/2016 0955      Component Value Date/Time   CALCIUM 10.3 05/28/2016 0955   ALKPHOS 79 05/28/2016 0955   AST 12 05/28/2016 0955   ALT 12 05/28/2016 0955   BILITOT 0.41 05/28/2016 0955       RADIOGRAPHIC STUDIES: No results found.  ASSESSMENT AND PLAN: This is a very pleasant 75 years old African-American female with recurrent non-small cell lung cancer, adenocarcinoma presented with multiple pulmonary nodules currently on treatment with Tarceva 100 mg by mouth daily status post more than 19 months. She is tolerating her treatment fairly well with no significant adverse effects except for mild fatigue and few episodes of diarrhea. The recent CT scan of the chest showed some evidence for disease progression with enlarging left upper lobe nodule as well as enlarging multiple bilateral groundglass and semisolid lesions. I personally reviewed the images. I discussed the results with the patient today. I recommended for her to discontinue treatment with Tarceva at this point.  She is currently on treatment with immunotherapy with Nivolumab status post 7 cycles and tolerating her treatment well.  I recommended for the patient to proceed with cycle #8 today as scheduled. I will see her back for follow-up  visit in 2 weeks for reevaluation before starting cycle #9 after repeating CT scan of the chest for restaging of her disease. She was advised to call immediately if she has any concerning symptoms in the interval. The patient voices understanding of current disease status and treatment options and is in agreement with the current care plan.  All questions were answered. The patient knows to call the clinic with any problems, questions or concerns. We can certainly see the patient much sooner if necessary.  Disclaimer: This note was dictated with voice recognition software. Similar sounding words can inadvertently be transcribed and may not be corrected upon review.

## 2016-06-06 ENCOUNTER — Ambulatory Visit (HOSPITAL_COMMUNITY)
Admission: RE | Admit: 2016-06-06 | Discharge: 2016-06-06 | Disposition: A | Discharge status: Home / Self Care | Payer: Medicare Other | Source: Ambulatory Visit | Attending: Internal Medicine | Admitting: Internal Medicine

## 2016-06-06 DIAGNOSIS — I2510 Atherosclerotic heart disease of native coronary artery without angina pectoris: Secondary | ICD-10-CM | POA: Diagnosis not present

## 2016-06-06 DIAGNOSIS — C3492 Malignant neoplasm of unspecified part of left bronchus or lung: Secondary | ICD-10-CM | POA: Insufficient documentation

## 2016-06-06 DIAGNOSIS — R918 Other nonspecific abnormal finding of lung field: Secondary | ICD-10-CM | POA: Diagnosis not present

## 2016-06-06 DIAGNOSIS — Z5112 Encounter for antineoplastic immunotherapy: Secondary | ICD-10-CM

## 2016-06-06 DIAGNOSIS — I700 Atherosclerosis of aorta: Secondary | ICD-10-CM | POA: Diagnosis not present

## 2016-06-06 DIAGNOSIS — C3491 Malignant neoplasm of unspecified part of right bronchus or lung: Secondary | ICD-10-CM

## 2016-06-11 ENCOUNTER — Ambulatory Visit (HOSPITAL_BASED_OUTPATIENT_CLINIC_OR_DEPARTMENT_OTHER): Payer: Medicare Other | Admitting: Internal Medicine

## 2016-06-11 ENCOUNTER — Ambulatory Visit: Payer: Medicare Other

## 2016-06-11 ENCOUNTER — Ambulatory Visit (HOSPITAL_BASED_OUTPATIENT_CLINIC_OR_DEPARTMENT_OTHER): Payer: Medicare Other

## 2016-06-11 ENCOUNTER — Encounter: Payer: Self-pay | Admitting: Internal Medicine

## 2016-06-11 ENCOUNTER — Other Ambulatory Visit (HOSPITAL_BASED_OUTPATIENT_CLINIC_OR_DEPARTMENT_OTHER): Payer: Medicare Other

## 2016-06-11 VITALS — BP 131/65 | HR 77 | Temp 98.2°F | Resp 18 | Ht 65.0 in | Wt 135.5 lb

## 2016-06-11 DIAGNOSIS — C3411 Malignant neoplasm of upper lobe, right bronchus or lung: Secondary | ICD-10-CM

## 2016-06-11 DIAGNOSIS — C3491 Malignant neoplasm of unspecified part of right bronchus or lung: Secondary | ICD-10-CM | POA: Diagnosis not present

## 2016-06-11 DIAGNOSIS — R635 Abnormal weight gain: Secondary | ICD-10-CM | POA: Diagnosis not present

## 2016-06-11 DIAGNOSIS — Z95828 Presence of other vascular implants and grafts: Secondary | ICD-10-CM

## 2016-06-11 DIAGNOSIS — R530 Neoplastic (malignant) related fatigue: Secondary | ICD-10-CM

## 2016-06-11 DIAGNOSIS — R197 Diarrhea, unspecified: Secondary | ICD-10-CM

## 2016-06-11 DIAGNOSIS — Z5112 Encounter for antineoplastic immunotherapy: Secondary | ICD-10-CM

## 2016-06-11 DIAGNOSIS — C3492 Malignant neoplasm of unspecified part of left bronchus or lung: Secondary | ICD-10-CM | POA: Diagnosis not present

## 2016-06-11 DIAGNOSIS — R200 Anesthesia of skin: Secondary | ICD-10-CM

## 2016-06-11 DIAGNOSIS — E538 Deficiency of other specified B group vitamins: Secondary | ICD-10-CM

## 2016-06-11 LAB — CBC WITH DIFFERENTIAL/PLATELET
BASO%: 0.4 % (ref 0.0–2.0)
BASOS ABS: 0 10*3/uL (ref 0.0–0.1)
EOS ABS: 0.3 10*3/uL (ref 0.0–0.5)
EOS%: 3.4 % (ref 0.0–7.0)
HCT: 38.3 % (ref 34.8–46.6)
HEMOGLOBIN: 12.6 g/dL (ref 11.6–15.9)
LYMPH#: 1.7 10*3/uL (ref 0.9–3.3)
LYMPH%: 19.7 % (ref 14.0–49.7)
MCH: 28.8 pg (ref 25.1–34.0)
MCHC: 32.9 g/dL (ref 31.5–36.0)
MCV: 87.4 fL (ref 79.5–101.0)
MONO#: 0.7 10*3/uL (ref 0.1–0.9)
MONO%: 7.6 % (ref 0.0–14.0)
NEUT%: 68.9 % (ref 38.4–76.8)
NEUTROS ABS: 5.9 10*3/uL (ref 1.5–6.5)
PLATELETS: 203 10*3/uL (ref 145–400)
RBC: 4.38 10*6/uL (ref 3.70–5.45)
RDW: 14.9 % — AB (ref 11.2–14.5)
WBC: 8.6 10*3/uL (ref 3.9–10.3)

## 2016-06-11 LAB — COMPREHENSIVE METABOLIC PANEL
ALBUMIN: 3.8 g/dL (ref 3.5–5.0)
ALT: 13 U/L (ref 0–55)
AST: 14 U/L (ref 5–34)
Alkaline Phosphatase: 78 U/L (ref 40–150)
Anion Gap: 11 mEq/L (ref 3–11)
BILIRUBIN TOTAL: 0.33 mg/dL (ref 0.20–1.20)
BUN: 11.9 mg/dL (ref 7.0–26.0)
CO2: 24 meq/L (ref 22–29)
CREATININE: 1.2 mg/dL — AB (ref 0.6–1.1)
Calcium: 10.4 mg/dL (ref 8.4–10.4)
Chloride: 108 mEq/L (ref 98–109)
EGFR: 49 mL/min/{1.73_m2} — ABNORMAL LOW (ref >90)
GLUCOSE: 94 mg/dL (ref 70–140)
Potassium: 4.1 mEq/L (ref 3.5–5.1)
SODIUM: 142 meq/L (ref 136–145)
TOTAL PROTEIN: 7.5 g/dL (ref 6.4–8.3)

## 2016-06-11 LAB — TSH: TSH: 0.237 m(IU)/L — ABNORMAL LOW (ref 0.308–3.960)

## 2016-06-11 MED ORDER — SODIUM CHLORIDE 0.9 % IV SOLN
Freq: Once | INTRAVENOUS | Status: AC
  Administered 2016-06-11: 11:00:00 via INTRAVENOUS

## 2016-06-11 MED ORDER — NIVOLUMAB CHEMO INJECTION 100 MG/10ML
240.0000 mg | Freq: Once | INTRAVENOUS | Status: AC
  Administered 2016-06-11: 240 mg via INTRAVENOUS
  Filled 2016-06-11: qty 20

## 2016-06-11 MED ORDER — SODIUM CHLORIDE 0.9% FLUSH
10.0000 mL | INTRAVENOUS | Status: DC | PRN
  Administered 2016-06-11: 10 mL
  Filled 2016-06-11: qty 10

## 2016-06-11 MED ORDER — HEPARIN SOD (PORK) LOCK FLUSH 100 UNIT/ML IV SOLN
500.0000 [IU] | Freq: Once | INTRAVENOUS | Status: AC | PRN
  Administered 2016-06-11: 500 [IU]
  Filled 2016-06-11: qty 5

## 2016-06-11 NOTE — Patient Instructions (Signed)
[facility] Discharge Instructions for Patients Receiving Chemotherapy  Today you received the following chemotherapy agents:  Nivolumab  To help prevent nausea and vomiting after your treatment, we encourage you to take your nausea medication as ordered per MD.   If you develop nausea and vomiting that is not controlled by your nausea medication, call the clinic.   BELOW ARE SYMPTOMS THAT SHOULD BE REPORTED IMMEDIATELY:  *FEVER GREATER THAN 100.5 F  *CHILLS WITH OR WITHOUT FEVER  NAUSEA AND VOMITING THAT IS NOT CONTROLLED WITH YOUR NAUSEA MEDICATION  *UNUSUAL SHORTNESS OF BREATH  *UNUSUAL BRUISING OR BLEEDING  TENDERNESS IN MOUTH AND THROAT WITH OR WITHOUT PRESENCE OF ULCERS  *URINARY PROBLEMS  *BOWEL PROBLEMS  UNUSUAL RASH Items with * indicate a potential emergency and should be followed up as soon as possible.  Feel free to call the clinic you have any questions or concerns. The clinic phone number is [phone].  Please show the CHEMO ALERT CARD at check-in to the Emergency Department and triage nurse.   

## 2016-06-11 NOTE — Progress Notes (Signed)
[facility] [phone]   [phone]  OFFICE PROGRESS NOTE  Terri Levels, NP [phone] [address] [place] [address]  DIAGNOSIS AND STAGE:  #1 Recurrent non-small cell lung cancer consistent with adenocarcinoma. This was initially diagnosed as stage IB (T2a N0 M0) well-differentiated adenocarcinoma of bronchioalveolar type in December 2010.  #2 Acute pulmonary embolism in the right lower lobe lobar, segmental and subsegmental sized pulmonary arteries, diagnosed on 11/04/2011.   PRIOR THERAPY:  1.Status post right upper lobectomy on September 28, 2009 under the care of Dr. Arlyce Dice. The patient refused adjuvant chemotherapy at that time. She had disease recurrence in June of 2012.  2.Status post 4 cycles of systemic chemotherapy with carboplatin for an AUC of 6 and paclitaxel at 200 mg per meter squared and Avastin at 15 mg/kg according to the ECOG protocol #5508.  3. Chemotherapy with Avastin 15 mg/kg according to the ECOG protocol #5508 status post 32 cycles. Last cycle was given on 06/30/2013 discontinued secondary to disease progression and persistent proteinuria. 4. Tarceva 150 mg by mouth daily, started 08/09/2013, status post 10 months of treatment. 5. Tarceva 100 mg by mouth daily started 07/02/2014, status post 19 months, discontinued secondary to disease progression.   CURRENT THERAPY: Immunotherapy with Nivolumab 240 MG IV every 2 weeks. First dose 02/20/2016. Status post 8 cycles.  CHEMOTHERAPY INTENT: Palliative.  CURRENT # OF CHEMOTHERAPY CYCLES: 9 CURRENT ANTIEMETICS: Compazine  CURRENT SMOKING STATUS: Non-smoker  ORAL CHEMOTHERAPY AND CONSENT: Tarceva and consent was signed 07/21/2013.  CURRENT BISPHOSPHONATES USE: None  PAIN MANAGEMENT: No pain  NARCOTICS INDUCED CONSTIPATION: No constipation.  LIVING WILL AND CODE STATUS: Full code  INTERVAL HISTORY: [name] 75 y.o. female returns to the clinic today for follow-up  visit. The patient is feeling fine today with no specific complaints. She tolerated the last cycle of her treatment with immunotherapy with Nivolumab fairly well. She gained several pounds since starting treatment with Nivolumab and she is wondering if this was a secondary effect of the treatment. She has no skin rash. She denied having any significant chest pain, shortness of breath, cough or hemoptysis. She denied having any weight loss or night sweats. She has no nausea or vomiting. She has no fever or chills. She had repeat CT scan of the chest performed recently and she is here for evaluation and discussion of her scan results.  MEDICAL HISTORY: Past Medical History:  Diagnosis Date  . Bilateral lung cancer ([facility]) 03/08/2011   recurrent  . Chronic fatigue 02/14/2016  . Encounter for antineoplastic immunotherapy 02/14/2016  . Foot pain 09/02/2011  . Glaucoma   . Hip pain 09/02/2011  . Hypercholesterolemia   . Hypertension   . Hypothyroidism   . Lung cancer ([facility]) 03/08/2011   recurrent    ALLERGIES:  is allergic to cymbalta [duloxetine hcl]; fish allergy; amitriptyline; codeine; and sulfa antibiotics.   SURGICAL HISTORY:  Past Surgical History:  Procedure Laterality Date  . LUNG LOBECTOMY  09/28/2009   right upper  . THORACOTOMY  09/28/2009   mini  . VIDEO ASSISTED THORACOSCOPY  09/28/2009    REVIEW OF SYSTEMS:  Constitutional: negative Eyes: negative Ears, nose, mouth, throat, and face: negative Respiratory: negative Cardiovascular: negative Gastrointestinal: negative Genitourinary:negative Integument/breast: negative Hematologic/lymphatic: negative Musculoskeletal:negative Neurological: negative Behavioral/Psych: negative Endocrine: negative Allergic/Immunologic: negative   PHYSICAL EXAMINATION: General appearance: alert, cooperative, fatigued and no distress Head: Normocephalic, without obvious abnormality, atraumatic Neck: no adenopathy, no JVD, supple, symmetrical,  trachea  midline and thyroid not enlarged, symmetric, no tenderness/mass/nodules Lymph nodes: Cervical, supraclavicular, and axillary nodes normal. Resp: clear to auscultation bilaterally Back: symmetric, no curvature. ROM normal. No CVA tenderness. Cardio: regular rate and rhythm, S1, S2 normal, no murmur, click, rub or gallop GI: soft, non-tender; bowel sounds normal; no masses,  no organomegaly Extremities: extremities normal, atraumatic, no cyanosis or edema Neurologic: Alert and oriented X 3, normal strength and tone. Normal symmetric reflexes. Normal coordination and gait  ECOG PERFORMANCE STATUS: 1 - Symptomatic but completely ambulatory  Blood pressure 131/65, pulse 77, temperature 98.2 F (36.8 C), temperature source Oral, resp. rate 18, height '5\' 5"'$  (1.651 m), weight 135 lb 8 oz (61.5 kg), SpO2 98 %.  LABORATORY DATA: Lab Results  Component Value Date   WBC 8.6 06/11/2016   HGB 12.6 06/11/2016   HCT 38.3 06/11/2016   MCV 87.4 06/11/2016   PLT 203 06/11/2016      Chemistry      Component Value Date/Time   NA 141 05/28/2016 0955   K 4.0 05/28/2016 0955   CL 105 01/11/2015 1603   CL 107 03/30/2013 1036   CO2 25 05/28/2016 0955   BUN 16.0 05/28/2016 0955   CREATININE 1.2 (H) 05/28/2016 0955      Component Value Date/Time   CALCIUM 10.3 05/28/2016 0955   ALKPHOS 79 05/28/2016 0955   AST 12 05/28/2016 0955   ALT 12 05/28/2016 0955   BILITOT 0.41 05/28/2016 0955       RADIOGRAPHIC STUDIES: Ct Chest Wo Contrast  Result Date: 06/06/2016 CLINICAL DATA:  Re- stage bilateral lung cancer on oral chemotherapy. History of right upper lobectomy in 2010. Additional remote history of thyroid cancer. EXAM: CT CHEST WITHOUT CONTRAST TECHNIQUE: Multidetector CT imaging of the chest was performed following the standard protocol without IV contrast. COMPARISON:  04/15/2016 chest CT. FINDINGS: Mediastinum/Nodes: Normal heart size. No significant pericardial fluid/thickening. Left  main, left anterior descending, left circumflex and right coronary atherosclerosis. Right internal jugular MediPort terminates at the cavoatrial junction. Atherosclerotic nonaneurysmal thoracic aorta. Normal caliber pulmonary arteries. No discrete thyroid nodules. Unremarkable esophagus. No axillary adenopathy. Stable coarsely calcified left hilar lymph nodes from prior granulomatous disease. No pathologically enlarged mediastinal or gross hilar nodes on this noncontrast study. Lungs/Pleura: No pneumothorax. No pleural effusion. Status post right upper lobectomy. Scattered calcified granulomas in the left upper lobe are stable. There are innumerable ground-glass, part solid and solid pulmonary nodules scattered throughout both lungs, which appear stable to decreased in size. Representative pulmonary nodules as follows: - irregular solid right lower lobe 1.3 x 0.7 cm nodule (series 5/image 55), previously 1.3 x 0.9 cm, slightly decreased - irregular part solid 3.5 x 2.4 cm medial right lower lobe nodule with 2.1 cm solid component (series 5/image 76), previously 3.5 x 2.5 cm with 2.1 cm solid component using similar measurement technique, unchanged - irregular part solid 4.0 x 3.4 cm posterior left upper lobe nodule (series 5/image 27), previously 4.0 x 3.4 cm - solid 2.6 x 2.1 cm superior segment left lower lobe nodule (series 5/image 59), previously 3.5 x 2.3 cm using similar measurement technique, decreased No acute consolidative airspace disease or new significant pulmonary nodules. Moderate centrilobular emphysema. Upper abdomen: Scattered granulomatous calcifications throughout the liver and spleen. Simple 1.0 cm central liver cyst. Musculoskeletal: No aggressive appearing focal osseous lesions. Moderate thoracic spondylosis. IMPRESSION: 1. No new or progressive metastatic disease in the chest. 2. Innumerable pulmonary nodules (including solid, part solid and ground-glass pulmonary nodules) appears  stable or  decreased in size, as described. 3. No thoracic adenopathy. 4. Additional findings include aortic atherosclerosis and left main and 3 vessel coronary atherosclerosis. Electronically Signed   By: Ilona Sorrel M.D.   On: 06/06/2016 15:59    ASSESSMENT AND PLAN: This is a very pleasant 75 years old African-American female with recurrent non-small cell lung cancer, adenocarcinoma presented with multiple pulmonary nodules currently on treatment with Tarceva 100 mg by mouth daily status post more than 19 months. She is tolerating her treatment fairly well with no significant adverse effects except for mild fatigue and few episodes of diarrhea. The recent CT scan of the chest showed some evidence for disease progression with enlarging left upper lobe nodule as well as enlarging multiple bilateral groundglass and semisolid lesions. I personally reviewed the images. I discussed the results with the patient today. I recommended for her to discontinue treatment with Tarceva at this point.  She is currently on treatment with immunotherapy with Nivolumab status post 8 cycles and tolerating her treatment well.  The recent CT scan of the chest showed further improvement of her disease. I discussed the scan results with the patient today. I recommended for the patient to proceed with cycle #9 today as scheduled. I will see her back for follow-up visit in 2 weeks for reevaluation before starting cycle #10. For the weight gain, I will continue to monitor her TSH closely. She was advised to call immediately if she has any concerning symptoms in the interval. The patient voices understanding of current disease status and treatment options and is in agreement with the current care plan.  All questions were answered. The patient knows to call the clinic with any problems, questions or concerns. We can certainly see the patient much sooner if necessary.  Disclaimer: This note was dictated with voice recognition software.  Similar sounding words can inadvertently be transcribed and may not be corrected upon review.

## 2016-06-25 ENCOUNTER — Ambulatory Visit (HOSPITAL_BASED_OUTPATIENT_CLINIC_OR_DEPARTMENT_OTHER): Payer: Medicare Other

## 2016-06-25 ENCOUNTER — Ambulatory Visit (HOSPITAL_BASED_OUTPATIENT_CLINIC_OR_DEPARTMENT_OTHER): Payer: Medicare Other | Admitting: Internal Medicine

## 2016-06-25 ENCOUNTER — Encounter: Payer: Self-pay | Admitting: Internal Medicine

## 2016-06-25 ENCOUNTER — Ambulatory Visit: Payer: Medicare Other

## 2016-06-25 ENCOUNTER — Other Ambulatory Visit (HOSPITAL_BASED_OUTPATIENT_CLINIC_OR_DEPARTMENT_OTHER): Payer: Medicare Other

## 2016-06-25 VITALS — BP 148/70 | HR 71 | Temp 97.6°F | Resp 16 | Ht 65.0 in | Wt 136.7 lb

## 2016-06-25 DIAGNOSIS — E538 Deficiency of other specified B group vitamins: Secondary | ICD-10-CM | POA: Diagnosis not present

## 2016-06-25 DIAGNOSIS — C3492 Malignant neoplasm of unspecified part of left bronchus or lung: Secondary | ICD-10-CM

## 2016-06-25 DIAGNOSIS — Z5112 Encounter for antineoplastic immunotherapy: Secondary | ICD-10-CM

## 2016-06-25 DIAGNOSIS — C3491 Malignant neoplasm of unspecified part of right bronchus or lung: Secondary | ICD-10-CM

## 2016-06-25 DIAGNOSIS — R200 Anesthesia of skin: Secondary | ICD-10-CM

## 2016-06-25 LAB — COMPREHENSIVE METABOLIC PANEL
ALT: 15 U/L (ref 0–55)
ANION GAP: 9 meq/L (ref 3–11)
AST: 13 U/L (ref 5–34)
Albumin: 4 g/dL (ref 3.5–5.0)
Alkaline Phosphatase: 78 U/L (ref 40–150)
BUN: 17.4 mg/dL (ref 7.0–26.0)
CHLORIDE: 107 meq/L (ref 98–109)
CO2: 25 meq/L (ref 22–29)
CREATININE: 1.2 mg/dL — AB (ref 0.6–1.1)
Calcium: 10.4 mg/dL (ref 8.4–10.4)
EGFR: 49 mL/min/{1.73_m2} — ABNORMAL LOW (ref >90)
Glucose: 89 mg/dl (ref 70–140)
POTASSIUM: 4.2 meq/L (ref 3.5–5.1)
Sodium: 141 mEq/L (ref 136–145)
Total Bilirubin: 0.43 mg/dL (ref 0.20–1.20)
Total Protein: 7.8 g/dL (ref 6.4–8.3)

## 2016-06-25 LAB — CBC WITH DIFFERENTIAL/PLATELET
BASO%: 0.8 % (ref 0.0–2.0)
Basophils Absolute: 0.1 10*3/uL (ref 0.0–0.1)
EOS%: 4.2 % (ref 0.0–7.0)
Eosinophils Absolute: 0.4 10*3/uL (ref 0.0–0.5)
HCT: 40.7 % (ref 34.8–46.6)
HGB: 13.2 g/dL (ref 11.6–15.9)
LYMPH%: 24.3 % (ref 14.0–49.7)
MCH: 28.3 pg (ref 25.1–34.0)
MCHC: 32.4 g/dL (ref 31.5–36.0)
MCV: 87.3 fL (ref 79.5–101.0)
MONO#: 0.6 10*3/uL (ref 0.1–0.9)
MONO%: 7.2 % (ref 0.0–14.0)
NEUT#: 5.4 10*3/uL (ref 1.5–6.5)
NEUT%: 63.5 % (ref 38.4–76.8)
PLATELETS: 206 10*3/uL (ref 145–400)
RBC: 4.66 10*6/uL (ref 3.70–5.45)
RDW: 15.1 % — ABNORMAL HIGH (ref 11.2–14.5)
WBC: 8.5 10*3/uL (ref 3.9–10.3)
lymph#: 2.1 10*3/uL (ref 0.9–3.3)

## 2016-06-25 MED ORDER — SODIUM CHLORIDE 0.9 % IV SOLN: Freq: Once | INTRAVENOUS | Status: DC

## 2016-06-25 MED ORDER — SODIUM CHLORIDE 0.9 % IV SOLN
240.0000 mg | Freq: Once | INTRAVENOUS | Status: AC
  Administered 2016-06-25: 240 mg via INTRAVENOUS
  Filled 2016-06-25: qty 24

## 2016-06-25 MED ORDER — CYANOCOBALAMIN 1000 MCG/ML IJ SOLN
INTRAMUSCULAR | Status: AC
  Filled 2016-06-25: qty 1

## 2016-06-25 MED ORDER — CYANOCOBALAMIN 1000 MCG/ML IJ SOLN
1000.0000 ug | Freq: Once | INTRAMUSCULAR | Status: AC
  Administered 2016-06-25: 1000 ug via INTRAMUSCULAR

## 2016-06-25 MED ORDER — HEPARIN SOD (PORK) LOCK FLUSH 100 UNIT/ML IV SOLN
500.0000 [IU] | Freq: Once | INTRAVENOUS | Status: AC | PRN
  Administered 2016-06-25: 500 [IU]
  Filled 2016-06-25: qty 5

## 2016-06-25 MED ORDER — SODIUM CHLORIDE 0.9% FLUSH
10.0000 mL | INTRAVENOUS | Status: DC | PRN
  Administered 2016-06-25: 10 mL
  Filled 2016-06-25: qty 10

## 2016-06-25 NOTE — Progress Notes (Signed)
[facility] [phone]   [phone]  OFFICE PROGRESS NOTE  [name], NP [phone] [place] [address]  DIAGNOSIS AND STAGE:  #1 Recurrent non-small cell lung cancer consistent with adenocarcinoma. This was initially diagnosed as stage IB (T2a N0 M0) well-differentiated adenocarcinoma of bronchioalveolar type in December 2010.  #2 Acute pulmonary embolism in the right lower lobe lobar, segmental and subsegmental sized pulmonary arteries, diagnosed on 11/04/2011.   PRIOR THERAPY:  1.Status post right upper lobectomy on September 28, 2009 under the care of Dr. Arlyce Dice. The patient refused adjuvant chemotherapy at that time. She had disease recurrence in June of 2012.  2.Status post 4 cycles of systemic chemotherapy with carboplatin for an AUC of 6 and paclitaxel at 200 mg per meter squared and Avastin at 15 mg/kg according to the ECOG protocol #5508.  3. Chemotherapy with Avastin 15 mg/kg according to the ECOG protocol #5508 status post 32 cycles. Last cycle was given on 06/30/2013 discontinued secondary to disease progression and persistent proteinuria. 4. Tarceva 150 mg by mouth daily, started 08/09/2013, status post 10 months of treatment. 5. Tarceva 100 mg by mouth daily started 07/02/2014, status post 19 months, discontinued secondary to disease progression.   CURRENT THERAPY: Immunotherapy with Nivolumab 240 MG IV every 2 weeks. First dose 02/20/2016. Status post 9 cycles.  CHEMOTHERAPY INTENT: Palliative.  CURRENT # OF CHEMOTHERAPY CYCLES: 10 CURRENT ANTIEMETICS: Compazine  CURRENT SMOKING STATUS: Non-smoker  ORAL CHEMOTHERAPY AND CONSENT: Tarceva and consent was signed 07/21/2013.  CURRENT BISPHOSPHONATES USE: None  PAIN MANAGEMENT: No pain  NARCOTICS INDUCED CONSTIPATION: No constipation.  LIVING WILL AND CODE STATUS: Full code  INTERVAL HISTORY: [name] 75 y.o. female returns to the clinic today for follow-up  visit. The patient is feeling fine today with no specific complaints. She tolerated the last cycle of her treatment with immunotherapy with Nivolumab fairly well. She has no skin rash. She denied having any significant chest pain, shortness of breath, cough or hemoptysis. She denied having any weight loss or night sweats. She has no nausea or vomiting. She has no fever or chills. She is here today for evaluation before starting cycle #10.  MEDICAL HISTORY: Past Medical History:  Diagnosis Date  . Bilateral lung cancer ([facility]) 03/08/2011   recurrent  . Chronic fatigue 02/14/2016  . Encounter for antineoplastic immunotherapy 02/14/2016  . Foot pain 09/02/2011  . Glaucoma   . Hip pain 09/02/2011  . Hypercholesterolemia   . Hypertension   . Hypothyroidism   . Lung cancer ([facility]) 03/08/2011   recurrent    ALLERGIES:  is allergic to cymbalta [duloxetine hcl]; fish allergy; amitriptyline; codeine; and sulfa antibiotics.   SURGICAL HISTORY:  Past Surgical History:  Procedure Laterality Date  . LUNG LOBECTOMY  09/28/2009   right upper  . THORACOTOMY  09/28/2009   mini  . VIDEO ASSISTED THORACOSCOPY  09/28/2009    REVIEW OF SYSTEMS:  A comprehensive review of systems was negative.   PHYSICAL EXAMINATION: General appearance: alert, cooperative, fatigued and no distress Head: Normocephalic, without obvious abnormality, atraumatic Neck: no adenopathy, no JVD, supple, symmetrical, trachea midline and thyroid not enlarged, symmetric, no tenderness/mass/nodules Lymph nodes: Cervical, supraclavicular, and axillary nodes normal. Resp: clear to auscultation bilaterally Back: symmetric, no curvature. ROM normal. No CVA tenderness. Cardio: regular rate and rhythm, S1, S2 normal, no murmur, click, rub or gallop GI: soft, non-tender; bowel sounds normal; no masses,  no organomegaly Extremities: extremities normal, atraumatic,  no cyanosis or edema Neurologic: Alert and oriented X 3, normal strength and  tone. Normal symmetric reflexes. Normal coordination and gait  ECOG PERFORMANCE STATUS: 1 - Symptomatic but completely ambulatory  Blood pressure (!) 148/70, pulse 71, temperature 97.6 F (36.4 C), temperature source Oral, resp. rate 16, height '5\' 5"'$  (1.651 m), weight 136 lb 11.2 oz (62 kg), SpO2 99 %.  LABORATORY DATA: Lab Results  Component Value Date   WBC 8.5 06/25/2016   HGB 13.2 06/25/2016   HCT 40.7 06/25/2016   MCV 87.3 06/25/2016   PLT 206 06/25/2016      Chemistry      Component Value Date/Time   NA 141 06/25/2016 1029   K 4.2 06/25/2016 1029   CL 105 01/11/2015 1603   CL 107 03/30/2013 1036   CO2 25 06/25/2016 1029   BUN 17.4 06/25/2016 1029   CREATININE 1.2 (H) 06/25/2016 1029      Component Value Date/Time   CALCIUM 10.4 06/25/2016 1029   ALKPHOS 78 06/25/2016 1029   AST 13 06/25/2016 1029   ALT 15 06/25/2016 1029   BILITOT 0.43 06/25/2016 1029       RADIOGRAPHIC STUDIES: Ct Chest Wo Contrast  Result Date: 06/06/2016 CLINICAL DATA:  Re- stage bilateral lung cancer on oral chemotherapy. History of right upper lobectomy in 2010. Additional remote history of thyroid cancer. EXAM: CT CHEST WITHOUT CONTRAST TECHNIQUE: Multidetector CT imaging of the chest was performed following the standard protocol without IV contrast. COMPARISON:  04/15/2016 chest CT. FINDINGS: Mediastinum/Nodes: Normal heart size. No significant pericardial fluid/thickening. Left main, left anterior descending, left circumflex and right coronary atherosclerosis. Right internal jugular MediPort terminates at the cavoatrial junction. Atherosclerotic nonaneurysmal thoracic aorta. Normal caliber pulmonary arteries. No discrete thyroid nodules. Unremarkable esophagus. No axillary adenopathy. Stable coarsely calcified left hilar lymph nodes from prior granulomatous disease. No pathologically enlarged mediastinal or gross hilar nodes on this noncontrast study. Lungs/Pleura: No pneumothorax. No pleural  effusion. Status post right upper lobectomy. Scattered calcified granulomas in the left upper lobe are stable. There are innumerable ground-glass, part solid and solid pulmonary nodules scattered throughout both lungs, which appear stable to decreased in size. Representative pulmonary nodules as follows: - irregular solid right lower lobe 1.3 x 0.7 cm nodule (series 5/image 55), previously 1.3 x 0.9 cm, slightly decreased - irregular part solid 3.5 x 2.4 cm medial right lower lobe nodule with 2.1 cm solid component (series 5/image 76), previously 3.5 x 2.5 cm with 2.1 cm solid component using similar measurement technique, unchanged - irregular part solid 4.0 x 3.4 cm posterior left upper lobe nodule (series 5/image 27), previously 4.0 x 3.4 cm - solid 2.6 x 2.1 cm superior segment left lower lobe nodule (series 5/image 59), previously 3.5 x 2.3 cm using similar measurement technique, decreased No acute consolidative airspace disease or new significant pulmonary nodules. Moderate centrilobular emphysema. Upper abdomen: Scattered granulomatous calcifications throughout the liver and spleen. Simple 1.0 cm central liver cyst. Musculoskeletal: No aggressive appearing focal osseous lesions. Moderate thoracic spondylosis. IMPRESSION: 1. No new or progressive metastatic disease in the chest. 2. Innumerable pulmonary nodules (including solid, part solid and ground-glass pulmonary nodules) appears stable or decreased in size, as described. 3. No thoracic adenopathy. 4. Additional findings include aortic atherosclerosis and left main and 3 vessel coronary atherosclerosis. Electronically Signed   By: Ilona Sorrel M.D.   On: 06/06/2016 15:59    ASSESSMENT AND PLAN: This is a very pleasant 75 years old African-American female with recurrent  non-small cell lung cancer, adenocarcinoma presented with multiple pulmonary nodules currently on treatment with Tarceva 100 mg by mouth daily status post more than 19 months. She is  tolerating her treatment fairly well with no significant adverse effects except for mild fatigue and few episodes of diarrhea. The recent CT scan of the chest showed some evidence for disease progression with enlarging left upper lobe nodule as well as enlarging multiple bilateral groundglass and semisolid lesions. I personally reviewed the images. I discussed the results with the patient today. I recommended for her to discontinue treatment with Tarceva at this point.  She is currently on treatment with immunotherapy with Nivolumab status post 9 cycles and tolerating her treatment well.  I recommended for the patient to proceed with cycle #10 today as scheduled. I will see her back for follow-up visit in 2 weeks for reevaluation before starting cycle #11. She was advised to call immediately if she has any concerning symptoms in the interval. The patient voices understanding of current disease status and treatment options and is in agreement with the current care plan.  All questions were answered. The patient knows to call the clinic with any problems, questions or concerns. We can certainly see the patient much sooner if necessary.  Disclaimer: This note was dictated with voice recognition software. Similar sounding words can inadvertently be transcribed and may not be corrected upon review.

## 2016-06-25 NOTE — Patient Instructions (Signed)
[facility] Discharge Instructions for Patients Receiving Chemotherapy  Today you received the following chemotherapy agents Nivolumab.  To help prevent nausea and vomiting after your treatment, we encourage you to take your nausea medication as prescribed.   If you develop nausea and vomiting that is not controlled by your nausea medication, call the clinic.   BELOW ARE SYMPTOMS THAT SHOULD BE REPORTED IMMEDIATELY:  *FEVER GREATER THAN 100.5 F  *CHILLS WITH OR WITHOUT FEVER  NAUSEA AND VOMITING THAT IS NOT CONTROLLED WITH YOUR NAUSEA MEDICATION  *UNUSUAL SHORTNESS OF BREATH  *UNUSUAL BRUISING OR BLEEDING  TENDERNESS IN MOUTH AND THROAT WITH OR WITHOUT PRESENCE OF ULCERS  *URINARY PROBLEMS  *BOWEL PROBLEMS  UNUSUAL RASH Items with * indicate a potential emergency and should be followed up as soon as possible.  Feel free to call the clinic you have any questions or concerns. The clinic phone number is [phone].  Please show the CHEMO ALERT CARD at check-in to the Emergency Department and triage nurse.   

## 2016-06-28 ENCOUNTER — Telehealth: Payer: Self-pay | Admitting: Internal Medicine

## 2016-06-28 NOTE — Telephone Encounter (Signed)
Appointments confirmed with patient, per 06/11/16 los

## 2016-07-01 DIAGNOSIS — I10 Essential (primary) hypertension: Secondary | ICD-10-CM | POA: Diagnosis not present

## 2016-07-01 DIAGNOSIS — C3490 Malignant neoplasm of unspecified part of unspecified bronchus or lung: Secondary | ICD-10-CM | POA: Diagnosis not present

## 2016-07-01 DIAGNOSIS — R399 Unspecified symptoms and signs involving the genitourinary system: Secondary | ICD-10-CM | POA: Diagnosis not present

## 2016-07-01 DIAGNOSIS — E039 Hypothyroidism, unspecified: Secondary | ICD-10-CM | POA: Diagnosis not present

## 2016-07-01 DIAGNOSIS — I6529 Occlusion and stenosis of unspecified carotid artery: Secondary | ICD-10-CM | POA: Diagnosis not present

## 2016-07-09 ENCOUNTER — Other Ambulatory Visit (HOSPITAL_BASED_OUTPATIENT_CLINIC_OR_DEPARTMENT_OTHER): Payer: Medicare Other

## 2016-07-09 ENCOUNTER — Encounter: Payer: Self-pay | Admitting: *Deleted

## 2016-07-09 ENCOUNTER — Encounter: Payer: Self-pay | Admitting: Internal Medicine

## 2016-07-09 ENCOUNTER — Ambulatory Visit (HOSPITAL_BASED_OUTPATIENT_CLINIC_OR_DEPARTMENT_OTHER): Payer: Medicare Other | Admitting: Internal Medicine

## 2016-07-09 ENCOUNTER — Ambulatory Visit (HOSPITAL_BASED_OUTPATIENT_CLINIC_OR_DEPARTMENT_OTHER): Payer: Medicare Other

## 2016-07-09 VITALS — BP 150/61 | HR 65 | Temp 97.7°F | Resp 17 | Ht 65.0 in | Wt 138.3 lb

## 2016-07-09 DIAGNOSIS — Z5112 Encounter for antineoplastic immunotherapy: Secondary | ICD-10-CM

## 2016-07-09 DIAGNOSIS — Z95828 Presence of other vascular implants and grafts: Secondary | ICD-10-CM

## 2016-07-09 DIAGNOSIS — C3411 Malignant neoplasm of upper lobe, right bronchus or lung: Secondary | ICD-10-CM

## 2016-07-09 DIAGNOSIS — C3492 Malignant neoplasm of unspecified part of left bronchus or lung: Secondary | ICD-10-CM | POA: Diagnosis not present

## 2016-07-09 DIAGNOSIS — Z23 Encounter for immunization: Secondary | ICD-10-CM | POA: Diagnosis not present

## 2016-07-09 DIAGNOSIS — C3491 Malignant neoplasm of unspecified part of right bronchus or lung: Secondary | ICD-10-CM

## 2016-07-09 DIAGNOSIS — E538 Deficiency of other specified B group vitamins: Secondary | ICD-10-CM

## 2016-07-09 DIAGNOSIS — R200 Anesthesia of skin: Secondary | ICD-10-CM

## 2016-07-09 DIAGNOSIS — Z79899 Other long term (current) drug therapy: Secondary | ICD-10-CM

## 2016-07-09 DIAGNOSIS — R197 Diarrhea, unspecified: Secondary | ICD-10-CM

## 2016-07-09 LAB — CBC WITH DIFFERENTIAL/PLATELET
BASO%: 1 % (ref 0.0–2.0)
Basophils Absolute: 0.1 10*3/uL (ref 0.0–0.1)
EOS ABS: 0.4 10*3/uL (ref 0.0–0.5)
EOS%: 5.1 % (ref 0.0–7.0)
HEMATOCRIT: 38.8 % (ref 34.8–46.6)
HEMOGLOBIN: 12.6 g/dL (ref 11.6–15.9)
LYMPH#: 1.7 10*3/uL (ref 0.9–3.3)
LYMPH%: 24 % (ref 14.0–49.7)
MCH: 28.6 pg (ref 25.1–34.0)
MCHC: 32.6 g/dL (ref 31.5–36.0)
MCV: 87.6 fL (ref 79.5–101.0)
MONO#: 0.6 10*3/uL (ref 0.1–0.9)
MONO%: 8.4 % (ref 0.0–14.0)
NEUT%: 61.5 % (ref 38.4–76.8)
NEUTROS ABS: 4.3 10*3/uL (ref 1.5–6.5)
Platelets: 213 10*3/uL (ref 145–400)
RBC: 4.43 10*6/uL (ref 3.70–5.45)
RDW: 14.9 % — AB (ref 11.2–14.5)
WBC: 7 10*3/uL (ref 3.9–10.3)

## 2016-07-09 LAB — COMPREHENSIVE METABOLIC PANEL
ALBUMIN: 3.7 g/dL (ref 3.5–5.0)
ALK PHOS: 80 U/L (ref 40–150)
ALT: 15 U/L (ref 0–55)
AST: 12 U/L (ref 5–34)
Anion Gap: 9 mEq/L (ref 3–11)
BILIRUBIN TOTAL: 0.32 mg/dL (ref 0.20–1.20)
BUN: 14.5 mg/dL (ref 7.0–26.0)
CALCIUM: 10.2 mg/dL (ref 8.4–10.4)
CO2: 25 mEq/L (ref 22–29)
Chloride: 107 mEq/L (ref 98–109)
Creatinine: 1.2 mg/dL — ABNORMAL HIGH (ref 0.6–1.1)
EGFR: 49 mL/min/{1.73_m2} — AB (ref >90)
Glucose: 87 mg/dl (ref 70–140)
POTASSIUM: 4.2 meq/L (ref 3.5–5.1)
Sodium: 141 mEq/L (ref 136–145)
TOTAL PROTEIN: 7.3 g/dL (ref 6.4–8.3)

## 2016-07-09 LAB — TSH: TSH: 0.322 m[IU]/L (ref 0.308–3.960)

## 2016-07-09 MED ORDER — HEPARIN SOD (PORK) LOCK FLUSH 100 UNIT/ML IV SOLN
500.0000 [IU] | Freq: Once | INTRAVENOUS | Status: AC | PRN
  Administered 2016-07-09: 500 [IU]
  Filled 2016-07-09: qty 5

## 2016-07-09 MED ORDER — SODIUM CHLORIDE 0.9% FLUSH
10.0000 mL | INTRAVENOUS | Status: DC | PRN
  Administered 2016-07-09: 10 mL
  Filled 2016-07-09: qty 10

## 2016-07-09 MED ORDER — SODIUM CHLORIDE 0.9 % IV SOLN
240.0000 mg | Freq: Once | INTRAVENOUS | Status: AC
  Administered 2016-07-09: 240 mg via INTRAVENOUS
  Filled 2016-07-09: qty 4

## 2016-07-09 MED ORDER — INFLUENZA VAC SPLIT QUAD 0.5 ML IM SUSY
0.5000 mL | PREFILLED_SYRINGE | Freq: Once | INTRAMUSCULAR | Status: AC
  Administered 2016-07-09: 0.5 mL via INTRAMUSCULAR
  Filled 2016-07-09: qty 0.5

## 2016-07-09 MED ORDER — SODIUM CHLORIDE 0.9 % IV SOLN
Freq: Once | INTRAVENOUS | Status: AC
  Administered 2016-07-09: 11:00:00 via INTRAVENOUS

## 2016-07-09 NOTE — Progress Notes (Signed)
Oncology Nurse Navigator Documentation  Oncology Nurse Navigator Flowsheets 07/09/2016  Navigator Location [facility]  Navigator Encounter Type Clinic/MDC/spoke with patient today at [facility].  She is doing well.  No barriers identified, offered support.   Treatment Phase Treatment  Barriers/Navigation Needs No barriers at this time  Interventions None required  Acuity Level 1  Acuity Level 1 Minimal follow up required  Time Spent with Patient 15

## 2016-07-09 NOTE — Progress Notes (Signed)
[facility] [phone]   [phone]  OFFICE PROGRESS NOTE  Terri Levels, [name] [phone] [address] [place] [address]  DIAGNOSIS AND STAGE:  #1 Recurrent non-small cell lung cancer consistent with adenocarcinoma. This was initially diagnosed as stage IB (T2a N0 M0) well-differentiated adenocarcinoma of bronchioalveolar type in December 2010.  #2 Acute pulmonary embolism in the right lower lobe lobar, segmental and subsegmental sized pulmonary arteries, diagnosed on 11/04/2011.   PRIOR THERAPY:  1.Status post right upper lobectomy on September 28, 2009 under the care of Dr. Arlyce Dice. The patient refused adjuvant chemotherapy at that time. She had disease recurrence in June of 2012.  2.Status post 4 cycles of systemic chemotherapy with carboplatin for an AUC of 6 and paclitaxel at 200 mg per meter squared and Avastin at 15 mg/kg according to the ECOG protocol #5508.  3. Chemotherapy with Avastin 15 mg/kg according to the ECOG protocol #5508 status post 32 cycles. Last cycle was given on 06/30/2013 discontinued secondary to disease progression and persistent proteinuria. 4. Tarceva 150 mg by mouth daily, started 08/09/2013, status post 10 months of treatment. 5. Tarceva 100 mg by mouth daily started 07/02/2014, status post 19 months, discontinued secondary to disease progression.   CURRENT THERAPY: Immunotherapy with Nivolumab 240 MG IV every 2 weeks. First dose 02/20/2016. Status post 10 cycles.  CHEMOTHERAPY INTENT: Palliative.  CURRENT # OF CHEMOTHERAPY CYCLES: 11 CURRENT ANTIEMETICS: Compazine  CURRENT SMOKING STATUS: Non-smoker  ORAL CHEMOTHERAPY AND CONSENT: Tarceva and consent was signed 07/21/2013.  CURRENT BISPHOSPHONATES USE: None  PAIN MANAGEMENT: No pain  NARCOTICS INDUCED CONSTIPATION: No constipation.  LIVING WILL AND CODE STATUS: Full code  INTERVAL HISTORY: [name] 75 y.o. female returns to the clinic today for follow-up  visit. The patient is feeling fine today with no specific complaints except for a dizzy spell 2 days ago. She tolerated the last cycle of her treatment with immunotherapy with Nivolumab fairly well. She has no skin rash. She denied having any significant chest pain, shortness of breath, cough or hemoptysis. She denied having any weight loss or night sweats. She has no nausea or vomiting. She has no fever or chills. She is here today for evaluation before starting cycle #11.  MEDICAL HISTORY: Past Medical History:  Diagnosis Date  . Bilateral lung cancer ([facility]) 03/08/2011   recurrent  . Chronic fatigue 02/14/2016  . Encounter for antineoplastic immunotherapy 02/14/2016  . Foot pain 09/02/2011  . Glaucoma   . Hip pain 09/02/2011  . Hypercholesterolemia   . Hypertension   . Hypothyroidism   . Lung cancer ([facility]) 03/08/2011   recurrent    ALLERGIES:  is allergic to cymbalta [duloxetine hcl]; fish allergy; amitriptyline; codeine; and sulfa antibiotics.   SURGICAL HISTORY:  Past Surgical History:  Procedure Laterality Date  . LUNG LOBECTOMY  09/28/2009   right upper  . THORACOTOMY  09/28/2009   mini  . VIDEO ASSISTED THORACOSCOPY  09/28/2009    REVIEW OF SYSTEMS:  A comprehensive review of systems was negative.   PHYSICAL EXAMINATION: General appearance: alert, cooperative, fatigued and no distress Head: Normocephalic, without obvious abnormality, atraumatic Neck: no adenopathy, no JVD, supple, symmetrical, trachea midline and thyroid not enlarged, symmetric, no tenderness/mass/nodules Lymph nodes: Cervical, supraclavicular, and axillary nodes normal. Resp: clear to auscultation bilaterally Back: symmetric, no curvature. ROM normal. No CVA tenderness. Cardio: regular rate and rhythm, S1, S2 normal, no murmur, click, rub or gallop GI: soft, non-tender; bowel sounds normal; no  masses,  no organomegaly Extremities: extremities normal, atraumatic, no cyanosis or edema Neurologic: Alert and  oriented X 3, normal strength and tone. Normal symmetric reflexes. Normal coordination and gait  ECOG PERFORMANCE STATUS: 1 - Symptomatic but completely ambulatory  Blood pressure (!) 150/61, pulse 65, temperature 97.7 F (36.5 C), resp. rate 17, height '5\' 5"'$  (1.651 m), weight 138 lb 4.8 oz (62.7 kg).  LABORATORY DATA: Lab Results  Component Value Date   WBC 7.0 07/09/2016   HGB 12.6 07/09/2016   HCT 38.8 07/09/2016   MCV 87.6 07/09/2016   PLT 213 07/09/2016      Chemistry      Component Value Date/Time   NA 141 07/09/2016 0848   K 4.2 07/09/2016 0848   CL 105 01/11/2015 1603   CL 107 03/30/2013 1036   CO2 25 07/09/2016 0848   BUN 14.5 07/09/2016 0848   CREATININE 1.2 (H) 07/09/2016 0848      Component Value Date/Time   CALCIUM 10.2 07/09/2016 0848   ALKPHOS 80 07/09/2016 0848   AST 12 07/09/2016 0848   ALT 15 07/09/2016 0848   BILITOT 0.32 07/09/2016 0848       RADIOGRAPHIC STUDIES: No results found.  ASSESSMENT AND PLAN: This is a very pleasant 75 years old African-American female with recurrent non-small cell lung cancer, adenocarcinoma presented with multiple pulmonary nodules currently on treatment with Tarceva 100 mg by mouth daily status post more than 19 months. She is tolerating her treatment fairly well with no significant adverse effects except for mild fatigue and few episodes of diarrhea. The recent CT scan of the chest showed some evidence for disease progression with enlarging left upper lobe nodule as well as enlarging multiple bilateral groundglass and semisolid lesions. I personally reviewed the images. I discussed the results with the patient today. I recommended for her to discontinue treatment with Tarceva at this point.  She is currently on treatment with immunotherapy with Nivolumab status post 10 cycles and tolerating her treatment well.  I recommended for the patient to proceed with cycle #11 today as scheduled. I will see her back for follow-up  visit in 2 weeks for reevaluation before starting cycle #12. She was advised to call immediately if she has any concerning symptoms in the interval. The patient voices understanding of current disease status and treatment options and is in agreement with the current care plan.  All questions were answered. The patient knows to call the clinic with any problems, questions or concerns. We can certainly see the patient much sooner if necessary.  Disclaimer: This note was dictated with voice recognition software. Similar sounding words can inadvertently be transcribed and may not be corrected upon review.

## 2016-07-09 NOTE — Patient Instructions (Addendum)
[facility] Discharge Instructions for Patients Receiving Chemotherapy  Today you received the following chemotherapy agents:  Opdivo (nivolumab)  To help prevent nausea and vomiting after your treatment, we encourage you to take your nausea medication as prescribed.   If you develop nausea and vomiting that is not controlled by your nausea medication, call the clinic.   BELOW ARE SYMPTOMS THAT SHOULD BE REPORTED IMMEDIATELY:  *FEVER GREATER THAN 100.5 F  *CHILLS WITH OR WITHOUT FEVER  NAUSEA AND VOMITING THAT IS NOT CONTROLLED WITH YOUR NAUSEA MEDICATION  *UNUSUAL SHORTNESS OF BREATH  *UNUSUAL BRUISING OR BLEEDING  TENDERNESS IN MOUTH AND THROAT WITH OR WITHOUT PRESENCE OF ULCERS  *URINARY PROBLEMS  *BOWEL PROBLEMS  UNUSUAL RASH Items with * indicate a potential emergency and should be followed up as soon as possible.  Feel free to call the clinic you have any questions or concerns. The clinic phone number is [phone].  Please show the [facility] at check-in to the Emergency Department and triage nurse.

## 2016-07-23 ENCOUNTER — Ambulatory Visit (HOSPITAL_BASED_OUTPATIENT_CLINIC_OR_DEPARTMENT_OTHER): Payer: Medicare Other | Admitting: Internal Medicine

## 2016-07-23 ENCOUNTER — Ambulatory Visit (HOSPITAL_BASED_OUTPATIENT_CLINIC_OR_DEPARTMENT_OTHER): Payer: Medicare Other

## 2016-07-23 ENCOUNTER — Telehealth: Payer: Self-pay | Admitting: Internal Medicine

## 2016-07-23 ENCOUNTER — Encounter: Payer: Self-pay | Admitting: Internal Medicine

## 2016-07-23 ENCOUNTER — Other Ambulatory Visit: Payer: Self-pay | Admitting: Medical Oncology

## 2016-07-23 ENCOUNTER — Other Ambulatory Visit (HOSPITAL_BASED_OUTPATIENT_CLINIC_OR_DEPARTMENT_OTHER): Payer: Medicare Other

## 2016-07-23 VITALS — BP 130/65 | HR 81 | Temp 97.8°F | Resp 18 | Ht 65.0 in | Wt 138.4 lb

## 2016-07-23 DIAGNOSIS — C3492 Malignant neoplasm of unspecified part of left bronchus or lung: Secondary | ICD-10-CM

## 2016-07-23 DIAGNOSIS — E538 Deficiency of other specified B group vitamins: Secondary | ICD-10-CM

## 2016-07-23 DIAGNOSIS — C3491 Malignant neoplasm of unspecified part of right bronchus or lung: Secondary | ICD-10-CM

## 2016-07-23 DIAGNOSIS — Z5112 Encounter for antineoplastic immunotherapy: Secondary | ICD-10-CM

## 2016-07-23 DIAGNOSIS — R200 Anesthesia of skin: Secondary | ICD-10-CM

## 2016-07-23 DIAGNOSIS — Z86711 Personal history of pulmonary embolism: Secondary | ICD-10-CM

## 2016-07-23 LAB — CBC WITH DIFFERENTIAL/PLATELET
BASO%: 0.9 % (ref 0.0–2.0)
BASOS ABS: 0.1 10*3/uL (ref 0.0–0.1)
EOS%: 4.1 % (ref 0.0–7.0)
Eosinophils Absolute: 0.4 10*3/uL (ref 0.0–0.5)
HEMATOCRIT: 39.4 % (ref 34.8–46.6)
HGB: 12.7 g/dL (ref 11.6–15.9)
LYMPH%: 24.6 % (ref 14.0–49.7)
MCH: 28.5 pg (ref 25.1–34.0)
MCHC: 32.2 g/dL (ref 31.5–36.0)
MCV: 88.5 fL (ref 79.5–101.0)
MONO#: 0.8 10*3/uL (ref 0.1–0.9)
MONO%: 8.1 % (ref 0.0–14.0)
NEUT#: 6.2 10*3/uL (ref 1.5–6.5)
NEUT%: 62.3 % (ref 38.4–76.8)
Platelets: 226 10*3/uL (ref 145–400)
RBC: 4.45 10*6/uL (ref 3.70–5.45)
RDW: 14.8 % — ABNORMAL HIGH (ref 11.2–14.5)
WBC: 9.9 10*3/uL (ref 3.9–10.3)
lymph#: 2.4 10*3/uL (ref 0.9–3.3)

## 2016-07-23 LAB — COMPREHENSIVE METABOLIC PANEL
ALBUMIN: 3.8 g/dL (ref 3.5–5.0)
ALK PHOS: 80 U/L (ref 40–150)
ALT: 9 U/L (ref 0–55)
ANION GAP: 10 meq/L (ref 3–11)
AST: 11 U/L (ref 5–34)
BILIRUBIN TOTAL: 0.44 mg/dL (ref 0.20–1.20)
BUN: 14.2 mg/dL (ref 7.0–26.0)
CO2: 23 mEq/L (ref 22–29)
Calcium: 10.2 mg/dL (ref 8.4–10.4)
Chloride: 108 mEq/L (ref 98–109)
Creatinine: 1.2 mg/dL — ABNORMAL HIGH (ref 0.6–1.1)
EGFR: 51 mL/min/{1.73_m2} — AB (ref >90)
Glucose: 94 mg/dl (ref 70–140)
Potassium: 3.7 mEq/L (ref 3.5–5.1)
Sodium: 141 mEq/L (ref 136–145)
TOTAL PROTEIN: 7.5 g/dL (ref 6.4–8.3)

## 2016-07-23 MED ORDER — CYANOCOBALAMIN 1000 MCG/ML IJ SOLN
INTRAMUSCULAR | Status: AC
  Filled 2016-07-23: qty 1

## 2016-07-23 MED ORDER — SODIUM CHLORIDE 0.9 % IV SOLN
Freq: Once | INTRAVENOUS | Status: AC
  Administered 2016-07-23: 12:00:00 via INTRAVENOUS

## 2016-07-23 MED ORDER — SODIUM CHLORIDE 0.9% FLUSH
10.0000 mL | INTRAVENOUS | Status: DC | PRN
  Administered 2016-07-23: 10 mL
  Filled 2016-07-23: qty 10

## 2016-07-23 MED ORDER — HEPARIN SOD (PORK) LOCK FLUSH 100 UNIT/ML IV SOLN
500.0000 [IU] | Freq: Once | INTRAVENOUS | Status: AC | PRN
  Administered 2016-07-23: 500 [IU]
  Filled 2016-07-23: qty 5

## 2016-07-23 MED ORDER — NIVOLUMAB CHEMO INJECTION 100 MG/10ML
240.0000 mg | Freq: Once | INTRAVENOUS | Status: AC
  Administered 2016-07-23: 240 mg via INTRAVENOUS
  Filled 2016-07-23: qty 4

## 2016-07-23 MED ORDER — CYANOCOBALAMIN 1000 MCG/ML IJ SOLN
1000.0000 ug | Freq: Once | INTRAMUSCULAR | Status: AC
  Administered 2016-07-23: 1000 ug via INTRAMUSCULAR

## 2016-07-23 NOTE — Patient Instructions (Addendum)
[facility] Discharge Instructions for Patients Receiving Chemotherapy  Today you received the following chemotherapy agents Nivolumab  To help prevent nausea and vomiting after your treatment, we encourage you to take your nausea medication    If you develop nausea and vomiting that is not controlled by your nausea medication, call the clinic.   BELOW ARE SYMPTOMS THAT SHOULD BE REPORTED IMMEDIATELY:  *FEVER GREATER THAN 100.5 F  *CHILLS WITH OR WITHOUT FEVER  NAUSEA AND VOMITING THAT IS NOT CONTROLLED WITH YOUR NAUSEA MEDICATION  *UNUSUAL SHORTNESS OF BREATH  *UNUSUAL BRUISING OR BLEEDING  TENDERNESS IN MOUTH AND THROAT WITH OR WITHOUT PRESENCE OF ULCERS  *URINARY PROBLEMS  *BOWEL PROBLEMS  UNUSUAL RASH Items with * indicate a potential emergency and should be followed up as soon as possible.  Feel free to call the clinic you have any questions or concerns. The clinic phone number is [phone].  Please show the [facility] at check-in to the Emergency Department and triage nurse.  Cyanocobalamin, Vitamin B12 injection What is this medicine? CYANOCOBALAMIN (sye an oh koe BAL a min) is a man made form of vitamin B12. Vitamin B12 is used in the growth of healthy blood cells, nerve cells, and proteins in the body. It also helps with the metabolism of fats and carbohydrates. This medicine is used to treat people who can not absorb vitamin B12. This medicine may be used for other purposes; ask your health care provider or pharmacist if you have questions. What should I tell my health care provider before I take this medicine? They need to know if you have any of these conditions: -kidney disease -Leber's disease -megaloblastic anemia -an unusual or allergic reaction to cyanocobalamin, cobalt, other medicines, foods, dyes, or preservatives -pregnant or trying to get pregnant -breast-feeding How should I use this medicine? This medicine is injected  into a muscle or deeply under the skin. It is usually given by a health care professional in a clinic or doctor's office. However, your doctor may teach you how to inject yourself. Follow all instructions. Talk to your pediatrician regarding the use of this medicine in children. Special care may be needed. Overdosage: If you think you have taken too much of this medicine contact a poison control center or emergency room at once. NOTE: This medicine is only for you. Do not share this medicine with others. What if I miss a dose? If you are given your dose at a clinic or doctor's office, call to reschedule your appointment. If you give your own injections and you miss a dose, take it as soon as you can. If it is almost time for your next dose, take only that dose. Do not take double or extra doses. What may interact with this medicine? -colchicine -heavy alcohol intake This list may not describe all possible interactions. Give your health care provider a list of all the medicines, herbs, non-prescription drugs, or dietary supplements you use. Also tell them if you smoke, drink alcohol, or use illegal drugs. Some items may interact with your medicine. What should I watch for while using this medicine? Visit your doctor or health care professional regularly. You may need blood work done while you are taking this medicine. You may need to follow a special diet. Talk to your doctor. Limit your alcohol intake and avoid smoking to get the best benefit. What side effects may I notice from receiving this medicine? Side effects that you should report to your doctor or  health care professional as soon as possible: -allergic reactions like skin rash, itching or hives, swelling of the face, lips, or tongue -blue tint to skin -chest tightness, pain -difficulty breathing, wheezing -dizziness -red, swollen painful area on the leg Side effects that usually do not require medical attention (report to your doctor or  health care professional if they continue or are bothersome): -diarrhea -headache This list may not describe all possible side effects. Call your doctor for medical advice about side effects. You may report side effects to FDA at 1-800-FDA-1088. Where should I keep my medicine? Keep out of the reach of children. Store at room temperature between 15 and 30 degrees C (59 and 85 degrees F). Protect from light. Throw away any unused medicine after the expiration date. NOTE: This sheet is a summary. It may not cover all possible information. If you have questions about this medicine, talk to your doctor, pharmacist, or health care provider.    2016, Elsevier/Gold Standard. (2008-01-11 22:10:20)

## 2016-07-23 NOTE — Progress Notes (Signed)
[facility] Telephone:(336) [phone]   [phone]  OFFICE PROGRESS NOTE  [name], NP [phone] [place] [address]  DIAGNOSIS AND STAGE:  #1 Recurrent non-small cell lung cancer consistent with adenocarcinoma. This was initially diagnosed as stage IB (T2a N0 M0) well-differentiated adenocarcinoma of bronchioalveolar type in December 2010.  #2 Acute pulmonary embolism in the right lower lobe lobar, segmental and subsegmental sized pulmonary arteries, diagnosed on 11/04/2011.   PRIOR THERAPY:  1.Status post right upper lobectomy on September 28, 2009 under the care of Dr. Arlyce Dice. The patient refused adjuvant chemotherapy at that time. She had disease recurrence in June of 2012.  2.Status post 4 cycles of systemic chemotherapy with carboplatin for an AUC of 6 and paclitaxel at 200 mg per meter squared and Avastin at 15 mg/kg according to the ECOG protocol #5508.  3. Chemotherapy with Avastin 15 mg/kg according to the ECOG protocol #5508 status post 32 cycles. Last cycle was given on 06/30/2013 discontinued secondary to disease progression and persistent proteinuria. 4. Tarceva 150 mg by mouth daily, started 08/09/2013, status post 10 months of treatment. 5. Tarceva 100 mg by mouth daily started 07/02/2014, status post 19 months, discontinued secondary to disease progression.   CURRENT THERAPY: Immunotherapy with Nivolumab 240 MG IV every 2 weeks. First dose 02/20/2016. Status post 11 cycles.  CHEMOTHERAPY INTENT: Palliative.  CURRENT # OF CHEMOTHERAPY CYCLES: 12 CURRENT ANTIEMETICS: Compazine  CURRENT SMOKING STATUS: Non-smoker  ORAL CHEMOTHERAPY AND CONSENT: Tarceva and consent was signed 07/21/2013.  CURRENT BISPHOSPHONATES USE: None  PAIN MANAGEMENT: No pain  NARCOTICS INDUCED CONSTIPATION: No constipation.  LIVING WILL AND CODE STATUS: Full code  INTERVAL HISTORY: [name] 75 y.o. female returns to the clinic today for follow-up  visit. The patient is feeling fine today with no specific complaints. She tolerated the last cycle of her treatment with immunotherapy with Nivolumab fairly well. She has no skin rash. She denied having any significant chest pain, shortness of breath, cough or hemoptysis. She denied having any weight loss or night sweats. She has no nausea or vomiting. She has no fever or chills. She is here today for evaluation before starting cycle #12.  MEDICAL HISTORY: Past Medical History:  Diagnosis Date  . Bilateral lung cancer ([facility]) 03/08/2011   recurrent  . Chronic fatigue 02/14/2016  . Encounter for antineoplastic immunotherapy 02/14/2016  . Foot pain 09/02/2011  . Glaucoma   . Hip pain 09/02/2011  . Hypercholesterolemia   . Hypertension   . Hypothyroidism   . Lung cancer ([facility]) 03/08/2011   recurrent    ALLERGIES:  is allergic to cymbalta [duloxetine hcl]; fish allergy; amitriptyline; codeine; and sulfa antibiotics.   SURGICAL HISTORY:  Past Surgical History:  Procedure Laterality Date  . LUNG LOBECTOMY  09/28/2009   right upper  . THORACOTOMY  09/28/2009   mini  . VIDEO ASSISTED THORACOSCOPY  09/28/2009    REVIEW OF SYSTEMS:  A comprehensive review of systems was negative.   PHYSICAL EXAMINATION: General appearance: alert, cooperative, fatigued and no distress Head: Normocephalic, without obvious abnormality, atraumatic Neck: no adenopathy, no JVD, supple, symmetrical, trachea midline and thyroid not enlarged, symmetric, no tenderness/mass/nodules Lymph nodes: Cervical, supraclavicular, and axillary nodes normal. Resp: clear to auscultation bilaterally Back: symmetric, no curvature. ROM normal. No CVA tenderness. Cardio: regular rate and rhythm, S1, S2 normal, no murmur, click, rub or gallop GI: soft, non-tender; bowel sounds normal; no masses,  no organomegaly Extremities: extremities normal, atraumatic,  no cyanosis or edema Neurologic: Alert and oriented X 3, normal strength and  tone. Normal symmetric reflexes. Normal coordination and gait  ECOG PERFORMANCE STATUS: 1 - Symptomatic but completely ambulatory  Blood pressure 130/65, pulse 81, temperature 97.8 F (36.6 C), temperature source Oral, resp. rate 18, height '5\' 5"'$  (1.651 m), weight 138 lb 6.4 oz (62.8 kg), SpO2 100 %.  LABORATORY DATA: Lab Results  Component Value Date   WBC 9.9 07/23/2016   HGB 12.7 07/23/2016   HCT 39.4 07/23/2016   MCV 88.5 07/23/2016   PLT 226 07/23/2016      Chemistry      Component Value Date/Time   NA 141 07/23/2016 0932   K 3.7 07/23/2016 0932   CL 105 01/11/2015 1603   CL 107 03/30/2013 1036   CO2 23 07/23/2016 0932   BUN 14.2 07/23/2016 0932   CREATININE 1.2 (H) 07/23/2016 0932      Component Value Date/Time   CALCIUM 10.2 07/23/2016 0932   ALKPHOS 80 07/23/2016 0932   AST 11 07/23/2016 0932   ALT 9 07/23/2016 0932   BILITOT 0.44 07/23/2016 0932       RADIOGRAPHIC STUDIES: No results found.  ASSESSMENT AND PLAN: This is a very pleasant 75 years old African-American female with recurrent non-small cell lung cancer, adenocarcinoma presented with multiple pulmonary nodules currently on treatment with Tarceva 100 mg by mouth daily status post more than 19 months. She is tolerating her treatment fairly well with no significant adverse effects except for mild fatigue and few episodes of diarrhea. The recent CT scan of the chest showed some evidence for disease progression with enlarging left upper lobe nodule as well as enlarging multiple bilateral groundglass and semisolid lesions. I personally reviewed the images. I discussed the results with the patient today. I recommended for her to discontinue treatment with Tarceva at this point.  She is currently on treatment with immunotherapy with Nivolumab status post 11 cycles and tolerating her treatment well.  I recommended for the patient to proceed with cycle #12 today as scheduled. I will see her back for follow-up  visit in 2 weeks for reevaluation before starting cycle #13After repeating CT scan of the chest for restaging of her disease. For the vitamin B 12 deficiency, she will continue on monthly vitamin B 12 injection. She was advised to call immediately if she has any concerning symptoms in the interval. The patient voices understanding of current disease status and treatment options and is in agreement with the current care plan.  All questions were answered. The patient knows to call the clinic with any problems, questions or concerns. We can certainly see the patient much sooner if necessary.  Disclaimer: This note was dictated with voice recognition software. Similar sounding words can inadvertently be transcribed and may not be corrected upon review.

## 2016-07-23 NOTE — Telephone Encounter (Signed)
Appointments complete per 10/10 los. Central radiology will call re scan. Patient aware and will get new schedule at 10/24 visit.

## 2016-07-29 ENCOUNTER — Other Ambulatory Visit: Payer: Self-pay | Admitting: Internal Medicine

## 2016-08-02 ENCOUNTER — Ambulatory Visit (HOSPITAL_COMMUNITY)
Admission: RE | Admit: 2016-08-02 | Discharge: 2016-08-02 | Disposition: A | Discharge status: Home / Self Care | Payer: Medicare Other | Source: Ambulatory Visit | Attending: Internal Medicine | Admitting: Internal Medicine

## 2016-08-02 ENCOUNTER — Encounter (HOSPITAL_COMMUNITY): Payer: Self-pay

## 2016-08-02 DIAGNOSIS — I2510 Atherosclerotic heart disease of native coronary artery without angina pectoris: Secondary | ICD-10-CM | POA: Insufficient documentation

## 2016-08-02 DIAGNOSIS — C3492 Malignant neoplasm of unspecified part of left bronchus or lung: Secondary | ICD-10-CM | POA: Insufficient documentation

## 2016-08-02 DIAGNOSIS — J439 Emphysema, unspecified: Secondary | ICD-10-CM | POA: Insufficient documentation

## 2016-08-02 DIAGNOSIS — I700 Atherosclerosis of aorta: Secondary | ICD-10-CM | POA: Insufficient documentation

## 2016-08-02 DIAGNOSIS — C3491 Malignant neoplasm of unspecified part of right bronchus or lung: Secondary | ICD-10-CM | POA: Insufficient documentation

## 2016-08-02 DIAGNOSIS — E538 Deficiency of other specified B group vitamins: Secondary | ICD-10-CM

## 2016-08-02 DIAGNOSIS — Z5112 Encounter for antineoplastic immunotherapy: Secondary | ICD-10-CM

## 2016-08-02 MED ORDER — IOPAMIDOL (ISOVUE-300) INJECTION 61%
75.0000 mL | Freq: Once | INTRAVENOUS | Status: AC | PRN
  Administered 2016-08-02: 75 mL via INTRAVENOUS

## 2016-08-06 ENCOUNTER — Other Ambulatory Visit (HOSPITAL_BASED_OUTPATIENT_CLINIC_OR_DEPARTMENT_OTHER): Payer: Medicare Other

## 2016-08-06 ENCOUNTER — Ambulatory Visit (HOSPITAL_BASED_OUTPATIENT_CLINIC_OR_DEPARTMENT_OTHER): Payer: Medicare Other | Admitting: Internal Medicine

## 2016-08-06 ENCOUNTER — Other Ambulatory Visit: Payer: Self-pay | Admitting: Internal Medicine

## 2016-08-06 ENCOUNTER — Encounter: Payer: Self-pay | Admitting: Internal Medicine

## 2016-08-06 ENCOUNTER — Ambulatory Visit (HOSPITAL_BASED_OUTPATIENT_CLINIC_OR_DEPARTMENT_OTHER): Payer: Medicare Other

## 2016-08-06 VITALS — BP 143/64 | HR 70 | Temp 97.6°F | Resp 17 | Ht 65.0 in | Wt 139.0 lb

## 2016-08-06 DIAGNOSIS — Z79899 Other long term (current) drug therapy: Secondary | ICD-10-CM | POA: Diagnosis not present

## 2016-08-06 DIAGNOSIS — Z5112 Encounter for antineoplastic immunotherapy: Secondary | ICD-10-CM

## 2016-08-06 DIAGNOSIS — C3491 Malignant neoplasm of unspecified part of right bronchus or lung: Secondary | ICD-10-CM

## 2016-08-06 DIAGNOSIS — C3492 Malignant neoplasm of unspecified part of left bronchus or lung: Secondary | ICD-10-CM

## 2016-08-06 DIAGNOSIS — R197 Diarrhea, unspecified: Secondary | ICD-10-CM

## 2016-08-06 DIAGNOSIS — E538 Deficiency of other specified B group vitamins: Secondary | ICD-10-CM | POA: Diagnosis not present

## 2016-08-06 DIAGNOSIS — C3411 Malignant neoplasm of upper lobe, right bronchus or lung: Secondary | ICD-10-CM

## 2016-08-06 DIAGNOSIS — Z95828 Presence of other vascular implants and grafts: Secondary | ICD-10-CM

## 2016-08-06 DIAGNOSIS — R200 Anesthesia of skin: Secondary | ICD-10-CM

## 2016-08-06 LAB — COMPREHENSIVE METABOLIC PANEL
ALBUMIN: 4 g/dL (ref 3.5–5.0)
ALK PHOS: 81 U/L (ref 40–150)
ALT: 12 U/L (ref 0–55)
AST: 12 U/L (ref 5–34)
Anion Gap: 10 mEq/L (ref 3–11)
BUN: 17.6 mg/dL (ref 7.0–26.0)
CO2: 24 mEq/L (ref 22–29)
Calcium: 10.2 mg/dL (ref 8.4–10.4)
Chloride: 106 mEq/L (ref 98–109)
Creatinine: 1.2 mg/dL — ABNORMAL HIGH (ref 0.6–1.1)
EGFR: 51 mL/min/{1.73_m2} — ABNORMAL LOW (ref >90)
GLUCOSE: 95 mg/dL (ref 70–140)
POTASSIUM: 4.1 meq/L (ref 3.5–5.1)
SODIUM: 141 meq/L (ref 136–145)
Total Bilirubin: 0.32 mg/dL (ref 0.20–1.20)
Total Protein: 7.7 g/dL (ref 6.4–8.3)

## 2016-08-06 LAB — CBC WITH DIFFERENTIAL/PLATELET
BASO%: 1.4 % (ref 0.0–2.0)
Basophils Absolute: 0.1 10*3/uL (ref 0.0–0.1)
EOS%: 5.8 % (ref 0.0–7.0)
Eosinophils Absolute: 0.4 10*3/uL (ref 0.0–0.5)
HCT: 40.1 % (ref 34.8–46.6)
HEMOGLOBIN: 13 g/dL (ref 11.6–15.9)
LYMPH%: 22.8 % (ref 14.0–49.7)
MCH: 28.7 pg (ref 25.1–34.0)
MCHC: 32.5 g/dL (ref 31.5–36.0)
MCV: 88.4 fL (ref 79.5–101.0)
MONO#: 0.6 10*3/uL (ref 0.1–0.9)
MONO%: 8.2 % (ref 0.0–14.0)
NEUT%: 61.8 % (ref 38.4–76.8)
NEUTROS ABS: 4.6 10*3/uL (ref 1.5–6.5)
Platelets: 219 10*3/uL (ref 145–400)
RBC: 4.54 10*6/uL (ref 3.70–5.45)
RDW: 14.8 % — AB (ref 11.2–14.5)
WBC: 7.5 10*3/uL (ref 3.9–10.3)
lymph#: 1.7 10*3/uL (ref 0.9–3.3)

## 2016-08-06 LAB — TSH: TSH: 1.175 m(IU)/L (ref 0.308–3.960)

## 2016-08-06 MED ORDER — SODIUM CHLORIDE 0.9% FLUSH
10.0000 mL | INTRAVENOUS | Status: DC | PRN
  Administered 2016-08-06: 10 mL
  Filled 2016-08-06: qty 10

## 2016-08-06 MED ORDER — SODIUM CHLORIDE 0.9 % IV SOLN
Freq: Once | INTRAVENOUS | Status: AC
  Administered 2016-08-06: 10:00:00 via INTRAVENOUS

## 2016-08-06 MED ORDER — HEPARIN SOD (PORK) LOCK FLUSH 100 UNIT/ML IV SOLN
500.0000 [IU] | Freq: Once | INTRAVENOUS | Status: AC | PRN
  Administered 2016-08-06: 500 [IU]
  Filled 2016-08-06: qty 5

## 2016-08-06 MED ORDER — SODIUM CHLORIDE 0.9 % IV SOLN
240.0000 mg | Freq: Once | INTRAVENOUS | Status: AC
  Administered 2016-08-06: 240 mg via INTRAVENOUS
  Filled 2016-08-06: qty 20

## 2016-08-06 NOTE — Patient Instructions (Signed)
[facility] Discharge Instructions for Patients Receiving Chemotherapy  Today you received the following chemotherapy agents:  Nivolumab.  To help prevent nausea and vomiting after your treatment, we encourage you to take your nausea medication as directed.   If you develop nausea and vomiting that is not controlled by your nausea medication, call the clinic.   BELOW ARE SYMPTOMS THAT SHOULD BE REPORTED IMMEDIATELY:  *FEVER GREATER THAN 100.5 F  *CHILLS WITH OR WITHOUT FEVER  NAUSEA AND VOMITING THAT IS NOT CONTROLLED WITH YOUR NAUSEA MEDICATION  *UNUSUAL SHORTNESS OF BREATH  *UNUSUAL BRUISING OR BLEEDING  TENDERNESS IN MOUTH AND THROAT WITH OR WITHOUT PRESENCE OF ULCERS  *URINARY PROBLEMS  *BOWEL PROBLEMS  UNUSUAL RASH Items with * indicate a potential emergency and should be followed up as soon as possible.  Feel free to call the clinic you have any questions or concerns. The clinic phone number is [phone].  Please show the CHEMO ALERT CARD at check-in to the Emergency Department and triage nurse.   

## 2016-08-06 NOTE — Progress Notes (Signed)
[place] [phone]   [phone]  OFFICE PROGRESS NOTE  [name], NP [phone] [place] [address]  DIAGNOSIS AND STAGE:  #1 Recurrent non-small cell lung cancer consistent with adenocarcinoma. This was initially diagnosed as stage IB (T2a N0 M0) well-differentiated adenocarcinoma of bronchioalveolar type in December 2010.  #2 Acute pulmonary embolism in the right lower lobe lobar, segmental and subsegmental sized pulmonary arteries, diagnosed on 11/04/2011.   PRIOR THERAPY:  1.Status post right upper lobectomy on September 28, 2009 under the care of Dr. Arlyce Dice. The patient refused adjuvant chemotherapy at that time. She had disease recurrence in June of 2012.  2.Status post 4 cycles of systemic chemotherapy with carboplatin for an AUC of 6 and paclitaxel at 200 mg per meter squared and Avastin at 15 mg/kg according to the ECOG protocol #5508.  3. Chemotherapy with Avastin 15 mg/kg according to the ECOG protocol #5508 status post 32 cycles. Last cycle was given on 06/30/2013 discontinued secondary to disease progression and persistent proteinuria. 4. Tarceva 150 mg by mouth daily, started 08/09/2013, status post 10 months of treatment. 5. Tarceva 100 mg by mouth daily started 07/02/2014, status post 19 months, discontinued secondary to disease progression.   CURRENT THERAPY: Immunotherapy with Nivolumab 240 MG IV every 2 weeks. First dose 02/20/2016. Status post 12 cycles.  CHEMOTHERAPY INTENT: Palliative.  CURRENT # OF CHEMOTHERAPY CYCLES: 13 CURRENT ANTIEMETICS: Compazine  CURRENT SMOKING STATUS: Non-smoker  ORAL CHEMOTHERAPY AND CONSENT: Tarceva and consent was signed 07/21/2013.  CURRENT BISPHOSPHONATES USE: None  PAIN MANAGEMENT: No pain  NARCOTICS INDUCED CONSTIPATION: No constipation.  LIVING WILL AND CODE STATUS: Full code  INTERVAL HISTORY: [name] 75 y.o. female returns to the clinic today for follow-up  visit. The patient is feeling fine today with no specific complaints. She has a fall recently after she tripped on her dog. She tolerated the last cycle of her treatment with immunotherapy with Nivolumab fairly well. She has no skin rash. She denied having any significant chest pain, shortness of breath, cough or hemoptysis. She denied having any weight loss or night sweats. She has no nausea or vomiting. She has no fever or chills. She is currently undergoing treatment for urinary tract infection by her urologist and she is currently on nitrofurantoin. She had repeat CT scan of the chest performed recently and she is here for evaluation and discussion of her scan results.  MEDICAL HISTORY: Past Medical History:  Diagnosis Date  . Bilateral lung cancer ([facility]) 03/08/2011   recurrent  . Chronic fatigue 02/14/2016  . Encounter for antineoplastic immunotherapy 02/14/2016  . Foot pain 09/02/2011  . Glaucoma   . Hip pain 09/02/2011  . Hypercholesterolemia   . Hypertension   . Hypothyroidism   . Lung cancer ([place]) 03/08/2011   recurrent    ALLERGIES:  is allergic to cymbalta [duloxetine hcl]; fish allergy; amitriptyline; codeine; and sulfa antibiotics.   SURGICAL HISTORY:  Past Surgical History:  Procedure Laterality Date  . LUNG LOBECTOMY  09/28/2009   right upper  . THORACOTOMY  09/28/2009   mini  . VIDEO ASSISTED THORACOSCOPY  09/28/2009    REVIEW OF SYSTEMS:  Constitutional: negative Eyes: negative Ears, nose, mouth, throat, and face: negative Respiratory: negative Cardiovascular: negative Gastrointestinal: negative Genitourinary:negative Integument/breast: negative Hematologic/lymphatic: negative Musculoskeletal:positive for arthralgias Neurological: negative Behavioral/Psych: negative Endocrine: negative Allergic/Immunologic: negative   PHYSICAL EXAMINATION: General appearance: alert, cooperative, fatigued and no distress Head: Normocephalic, without obvious abnormality,  atraumatic Neck: no adenopathy, no JVD, supple, symmetrical, trachea midline and thyroid not enlarged, symmetric, no tenderness/mass/nodules Lymph nodes: Cervical, supraclavicular, and axillary nodes normal. Resp: clear to auscultation bilaterally Back: symmetric, no curvature. ROM normal. No CVA tenderness. Cardio: regular rate and rhythm, S1, S2 normal, no murmur, click, rub or gallop GI: soft, non-tender; bowel sounds normal; no masses,  no organomegaly Extremities: extremities normal, atraumatic, no cyanosis or edema Neurologic: Alert and oriented X 3, normal strength and tone. Normal symmetric reflexes. Normal coordination and gait  ECOG PERFORMANCE STATUS: 1 - Symptomatic but completely ambulatory  Blood pressure (!) 143/64, pulse 70, temperature 97.6 F (36.4 C), temperature source Oral, resp. rate 17, height '5\' 5"'$  (1.651 m), weight 139 lb (63 kg), SpO2 99 %.  LABORATORY DATA: Lab Results  Component Value Date   WBC 7.5 08/06/2016   HGB 13.0 08/06/2016   HCT 40.1 08/06/2016   MCV 88.4 08/06/2016   PLT 219 08/06/2016      Chemistry      Component Value Date/Time   NA 141 07/23/2016 0932   K 3.7 07/23/2016 0932   CL 105 01/11/2015 1603   CL 107 03/30/2013 1036   CO2 23 07/23/2016 0932   BUN 14.2 07/23/2016 0932   CREATININE 1.2 (H) 07/23/2016 0932      Component Value Date/Time   CALCIUM 10.2 07/23/2016 0932   ALKPHOS 80 07/23/2016 0932   AST 11 07/23/2016 0932   ALT 9 07/23/2016 0932   BILITOT 0.44 07/23/2016 0932       RADIOGRAPHIC STUDIES: Ct Chest W Contrast  Result Date: 08/02/2016 CLINICAL DATA:  Followup bilateral lung carcinoma. Undergoing immunotherapy. EXAM: CT CHEST WITH CONTRAST TECHNIQUE: Multidetector CT imaging of the chest was performed during intravenous contrast administration. CONTRAST:  63m ISOVUE-300 IOPAMIDOL (ISOVUE-300) INJECTION 61% COMPARISON:  06/06/2016 FINDINGS: Cardiovascular: No acute findings. Coronary artery calcification.  Aortic atherosclerosis. Right-sided Port-A-Cath remains in appropriate position. Mediastinum/Nodes: No masses or pathologically enlarged lymph nodes identified. Lungs/Pleura: Moderate emphysema again demonstrated. Previous right upper lobectomy and bilateral pleural- parenchymal scarring again noted. Multiple ground-glass, part solid, and solid pulmonary nodules are again seen throughout both lungs. Dominant solid nodule in the superior left lower lobe is stable measuring 2.5 x 2.1 cm on image 60/5 compared to 2.6 x 2.1 cm previously. Ill-defined solid nodule in the medial right lower lobe on image 79/5 is also stable measuring 2.0 x 0.7 cm. Another irregular solid nodule in the superior right lower lobe measures 1.3 x 0.7 cm on image 54/5 and is stable. Other part solid and ground-glass nodular opacities in both lungs remain stable. A new ill-defined pulmonary nodule is seen in the right middle lobe on image 51/5 which measures 6 x 7 mm. No other new or enlarging pulmonary nodules or masses are identified. No evidence of pleural effusion. Upper Abdomen: Normal adrenal glands. Stable small right hepatic lobe cyst. Musculoskeletal: No suspicious bone lesions or other significant abnormality. IMPRESSION: New 7 mm right middle lobe nodule which may be neoplastic, inflammatory, or infectious in etiology. Continued attention recommended on follow-up imaging. Other innumerable bilateral pulmonary nodules (solid, part solid, and ground-glass) show no significant change. No evidence of lymphadenopathy or pleural effusion. Moderate emphysema and bilateral pleural- parenchymal scarring. Aortic and coronary artery atherosclerosis. Electronically Signed   By: JEarle GellM.D.   On: 08/02/2016 14:10    ASSESSMENT AND PLAN: This is a very pleasant 75years old African-American female with recurrent non-small cell lung cancer, adenocarcinoma presented with  multiple pulmonary nodules currently on treatment with Tarceva 100 mg by  mouth daily status post more than 19 months. She is tolerating her treatment fairly well with no significant adverse effects except for mild fatigue and few episodes of diarrhea. The recent CT scan of the chest showed some evidence for disease progression with enlarging left upper lobe nodule as well as enlarging multiple bilateral groundglass and semisolid lesions. I personally reviewed the images. I discussed the results with the patient today. I recommended for her to discontinue treatment with Tarceva at this point.  She is currently on treatment with immunotherapy with Nivolumab status post 12 cycles and tolerating her treatment well.  The recent CT scan of the chest showed no evidence for disease progression but there was questionable 7 mm right middle lobe nodule of unclear etiology suspicious to be neoplastic, inflammatory or infectious in etiology. I discussed the scan results with the patient. I recommended for the patient to proceed with cycle #13 today as scheduled. I will see her back for follow-up visit in 2 weeks for reevaluation before starting cycle #14 For the vitamin B 12 deficiency, she will continue on monthly vitamin B 12 injection. She was advised to call immediately if she has any concerning symptoms in the interval. The patient voices understanding of current disease status and treatment options and is in agreement with the current care plan.  All questions were answered. The patient knows to call the clinic with any problems, questions or concerns. We can certainly see the patient much sooner if necessary.  Disclaimer: This note was dictated with voice recognition software. Similar sounding words can inadvertently be transcribed and may not be corrected upon review.

## 2016-08-20 ENCOUNTER — Ambulatory Visit (HOSPITAL_BASED_OUTPATIENT_CLINIC_OR_DEPARTMENT_OTHER): Payer: Medicare Other

## 2016-08-20 ENCOUNTER — Encounter: Payer: Self-pay | Admitting: Internal Medicine

## 2016-08-20 ENCOUNTER — Encounter: Payer: Self-pay | Admitting: Oncology

## 2016-08-20 ENCOUNTER — Other Ambulatory Visit (HOSPITAL_BASED_OUTPATIENT_CLINIC_OR_DEPARTMENT_OTHER): Payer: Medicare Other

## 2016-08-20 ENCOUNTER — Telehealth: Payer: Self-pay | Admitting: Internal Medicine

## 2016-08-20 ENCOUNTER — Ambulatory Visit (HOSPITAL_BASED_OUTPATIENT_CLINIC_OR_DEPARTMENT_OTHER): Payer: Medicare Other | Admitting: Internal Medicine

## 2016-08-20 VITALS — BP 141/62 | HR 74 | Temp 97.8°F | Resp 18 | Ht 65.0 in | Wt 138.0 lb

## 2016-08-20 DIAGNOSIS — Z5112 Encounter for antineoplastic immunotherapy: Secondary | ICD-10-CM

## 2016-08-20 DIAGNOSIS — C3492 Malignant neoplasm of unspecified part of left bronchus or lung: Secondary | ICD-10-CM

## 2016-08-20 DIAGNOSIS — E538 Deficiency of other specified B group vitamins: Secondary | ICD-10-CM

## 2016-08-20 DIAGNOSIS — C3491 Malignant neoplasm of unspecified part of right bronchus or lung: Secondary | ICD-10-CM

## 2016-08-20 DIAGNOSIS — R200 Anesthesia of skin: Secondary | ICD-10-CM

## 2016-08-20 LAB — CBC WITH DIFFERENTIAL/PLATELET
BASO%: 0.5 % (ref 0.0–2.0)
BASOS ABS: 0 10*3/uL (ref 0.0–0.1)
EOS%: 4.5 % (ref 0.0–7.0)
Eosinophils Absolute: 0.4 10*3/uL (ref 0.0–0.5)
HEMATOCRIT: 38.4 % (ref 34.8–46.6)
HGB: 12.9 g/dL (ref 11.6–15.9)
LYMPH%: 28.7 % (ref 14.0–49.7)
MCH: 29.3 pg (ref 25.1–34.0)
MCHC: 33.6 g/dL (ref 31.5–36.0)
MCV: 87.3 fL (ref 79.5–101.0)
MONO#: 0.6 10*3/uL (ref 0.1–0.9)
MONO%: 7.1 % (ref 0.0–14.0)
NEUT#: 5.2 10*3/uL (ref 1.5–6.5)
NEUT%: 59.2 % (ref 38.4–76.8)
PLATELETS: 202 10*3/uL (ref 145–400)
RBC: 4.4 10*6/uL (ref 3.70–5.45)
RDW: 14.4 % (ref 11.2–14.5)
WBC: 8.7 10*3/uL (ref 3.9–10.3)
lymph#: 2.5 10*3/uL (ref 0.9–3.3)

## 2016-08-20 LAB — COMPREHENSIVE METABOLIC PANEL
ALT: 11 U/L (ref 0–55)
ANION GAP: 9 meq/L (ref 3–11)
AST: 12 U/L (ref 5–34)
Albumin: 3.9 g/dL (ref 3.5–5.0)
Alkaline Phosphatase: 83 U/L (ref 40–150)
BUN: 14.8 mg/dL (ref 7.0–26.0)
CALCIUM: 10.1 mg/dL (ref 8.4–10.4)
CHLORIDE: 107 meq/L (ref 98–109)
CO2: 24 meq/L (ref 22–29)
CREATININE: 1.2 mg/dL — AB (ref 0.6–1.1)
EGFR: 50 mL/min/{1.73_m2} — ABNORMAL LOW (ref >90)
Glucose: 81 mg/dl (ref 70–140)
POTASSIUM: 3.9 meq/L (ref 3.5–5.1)
Sodium: 140 mEq/L (ref 136–145)
Total Bilirubin: 0.44 mg/dL (ref 0.20–1.20)
Total Protein: 7.6 g/dL (ref 6.4–8.3)

## 2016-08-20 MED ORDER — SODIUM CHLORIDE 0.9 % IV SOLN
Freq: Once | INTRAVENOUS | Status: AC
  Administered 2016-08-20: 14:00:00 via INTRAVENOUS

## 2016-08-20 MED ORDER — SODIUM CHLORIDE 0.9 % IV SOLN
240.0000 mg | Freq: Once | INTRAVENOUS | Status: AC
  Administered 2016-08-20: 240 mg via INTRAVENOUS
  Filled 2016-08-20: qty 20

## 2016-08-20 MED ORDER — SODIUM CHLORIDE 0.9% FLUSH
10.0000 mL | INTRAVENOUS | Status: DC | PRN
  Administered 2016-08-20: 10 mL
  Filled 2016-08-20: qty 10

## 2016-08-20 MED ORDER — CYANOCOBALAMIN 1000 MCG/ML IJ SOLN
1000.0000 ug | Freq: Once | INTRAMUSCULAR | Status: AC
  Administered 2016-08-20: 1000 ug via INTRAMUSCULAR

## 2016-08-20 MED ORDER — HEPARIN SOD (PORK) LOCK FLUSH 100 UNIT/ML IV SOLN
500.0000 [IU] | Freq: Once | INTRAVENOUS | Status: AC | PRN
  Administered 2016-08-20: 500 [IU]
  Filled 2016-08-20: qty 5

## 2016-08-20 MED ORDER — CYANOCOBALAMIN 1000 MCG/ML IJ SOLN
INTRAMUSCULAR | Status: AC
  Filled 2016-08-20: qty 1

## 2016-08-20 NOTE — Telephone Encounter (Signed)
Patient already on schedule for lab/fu/tx q2w thru end of December

## 2016-08-20 NOTE — Patient Instructions (Signed)
[facility] Discharge Instructions for Patients Receiving Chemotherapy  Today you received the following chemotherapy agents:  Nivolumab.  To help prevent nausea and vomiting after your treatment, we encourage you to take your nausea medication as directed.   If you develop nausea and vomiting that is not controlled by your nausea medication, call the clinic.   BELOW ARE SYMPTOMS THAT SHOULD BE REPORTED IMMEDIATELY:  *FEVER GREATER THAN 100.5 F  *CHILLS WITH OR WITHOUT FEVER  NAUSEA AND VOMITING THAT IS NOT CONTROLLED WITH YOUR NAUSEA MEDICATION  *UNUSUAL SHORTNESS OF BREATH  *UNUSUAL BRUISING OR BLEEDING  TENDERNESS IN MOUTH AND THROAT WITH OR WITHOUT PRESENCE OF ULCERS  *URINARY PROBLEMS  *BOWEL PROBLEMS  UNUSUAL RASH Items with * indicate a potential emergency and should be followed up as soon as possible.  Feel free to call the clinic you have any questions or concerns. The clinic phone number is [phone].  Please show the CHEMO ALERT CARD at check-in to the Emergency Department and triage nurse.   

## 2016-08-20 NOTE — Progress Notes (Signed)
[place] [phone]   Fax:(336) [phone]  OFFICE PROGRESS NOTE  [name], NP [phone] [place] [address]  DIAGNOSIS AND STAGE:  #1 Recurrent non-small cell lung cancer consistent with adenocarcinoma. This was initially diagnosed as stage IB (T2a N0 M0) well-differentiated adenocarcinoma of bronchioalveolar type in December 2010.  #2 Acute pulmonary embolism in the right lower lobe lobar, segmental and subsegmental sized pulmonary arteries, diagnosed on 11/04/2011.   PRIOR THERAPY:  1.Status post right upper lobectomy on September 28, 2009 under the care of Dr. Arlyce Dice. The patient refused adjuvant chemotherapy at that time. She had disease recurrence in June of 2012.  2.Status post 4 cycles of systemic chemotherapy with carboplatin for an AUC of 6 and paclitaxel at 200 mg per meter squared and Avastin at 15 mg/kg according to the ECOG protocol #5508.  3. Chemotherapy with Avastin 15 mg/kg according to the ECOG protocol #5508 status post 32 cycles. Last cycle was given on 06/30/2013 discontinued secondary to disease progression and persistent proteinuria. 4. Tarceva 150 mg by mouth daily, started 08/09/2013, status post 10 months of treatment. 5. Tarceva 100 mg by mouth daily started 07/02/2014, status post 19 months, discontinued secondary to disease progression.   CURRENT THERAPY: Immunotherapy with Nivolumab 240 MG IV every 2 weeks. First dose 02/20/2016. Status post 13 cycles.  CHEMOTHERAPY INTENT: Palliative.  CURRENT # OF CHEMOTHERAPY CYCLES: 14 CURRENT ANTIEMETICS: Compazine  CURRENT SMOKING STATUS: Non-smoker  ORAL CHEMOTHERAPY AND CONSENT: Tarceva and consent was signed 07/21/2013.  CURRENT BISPHOSPHONATES USE: None  PAIN MANAGEMENT: No pain  NARCOTICS INDUCED CONSTIPATION: No constipation.  LIVING WILL AND CODE STATUS: Full code  INTERVAL HISTORY: SABRYN PRESLAR 75 y.o. female returns to the clinic today for follow-up  visit. The patient is feeling fine today with no specific complaints. She tolerated the last cycle of her treatment with immunotherapy with Nivolumab fairly well. She has no skin rash. She denied having any significant chest pain, shortness of breath, cough or hemoptysis. She denied having any weight loss or night sweats. She has no nausea or vomiting. She has no fever or chills. She has a concern about financial assistance for coverage of her treatment with Nivolumab as she received a letter recently from the [employer] her that she would not be able to receive financial help from their Foundation is starting January 2018. She is here today to start cycle #14 of her treatment.  MEDICAL HISTORY: Past Medical History:  Diagnosis Date  . Bilateral lung cancer ([place]) 03/08/2011   recurrent  . Chronic fatigue 02/14/2016  . Encounter for antineoplastic immunotherapy 02/14/2016  . Foot pain 09/02/2011  . Glaucoma   . Hip pain 09/02/2011  . Hypercholesterolemia   . Hypertension   . Hypothyroidism   . Lung cancer ([place]) 03/08/2011   recurrent    ALLERGIES:  is allergic to cymbalta [duloxetine hcl]; fish allergy; amitriptyline; codeine; and sulfa antibiotics.   SURGICAL HISTORY:  Past Surgical History:  Procedure Laterality Date  . LUNG LOBECTOMY  09/28/2009   right upper  . THORACOTOMY  09/28/2009   mini  . VIDEO ASSISTED THORACOSCOPY  09/28/2009    REVIEW OF SYSTEMS:  A comprehensive review of systems was negative.   PHYSICAL EXAMINATION: General appearance: alert, cooperative, fatigued and no distress Head: Normocephalic, without obvious abnormality, atraumatic Neck: no adenopathy, no JVD, supple, symmetrical, trachea midline and thyroid not enlarged, symmetric, no tenderness/mass/nodules Lymph nodes: Cervical, supraclavicular, and axillary nodes  normal. Resp: clear to auscultation bilaterally Back: symmetric, no curvature. ROM normal. No CVA tenderness. Cardio: regular  rate and rhythm, S1, S2 normal, no murmur, click, rub or gallop GI: soft, non-tender; bowel sounds normal; no masses,  no organomegaly Extremities: extremities normal, atraumatic, no cyanosis or edema Neurologic: Alert and oriented X 3, normal strength and tone. Normal symmetric reflexes. Normal coordination and gait  ECOG PERFORMANCE STATUS: 1 - Symptomatic but completely ambulatory  Blood pressure (!) 141/62, pulse 74, temperature 97.8 F (36.6 C), temperature source Oral, resp. rate 18, height '5\' 5"'$  (1.651 m), weight 138 lb (62.6 kg), SpO2 99 %.  LABORATORY DATA: Lab Results  Component Value Date   WBC 8.7 08/20/2016   HGB 12.9 08/20/2016   HCT 38.4 08/20/2016   MCV 87.3 08/20/2016   PLT 202 08/20/2016      Chemistry      Component Value Date/Time   NA 140 08/20/2016 1121   K 3.9 08/20/2016 1121   CL 105 01/11/2015 1603   CL 107 03/30/2013 1036   CO2 24 08/20/2016 1121   BUN 14.8 08/20/2016 1121   CREATININE 1.2 (H) 08/20/2016 1121      Component Value Date/Time   CALCIUM 10.1 08/20/2016 1121   ALKPHOS 83 08/20/2016 1121   AST 12 08/20/2016 1121   ALT 11 08/20/2016 1121   BILITOT 0.44 08/20/2016 1121       RADIOGRAPHIC STUDIES: Ct Chest W Contrast  Result Date: 08/02/2016 CLINICAL DATA:  Followup bilateral lung carcinoma. Undergoing immunotherapy. EXAM: CT CHEST WITH CONTRAST TECHNIQUE: Multidetector CT imaging of the chest was performed during intravenous contrast administration. CONTRAST:  40m ISOVUE-300 IOPAMIDOL (ISOVUE-300) INJECTION 61% COMPARISON:  06/06/2016 FINDINGS: Cardiovascular: No acute findings. Coronary artery calcification. Aortic atherosclerosis. Right-sided Port-A-Cath remains in appropriate position. Mediastinum/Nodes: No masses or pathologically enlarged lymph nodes identified. Lungs/Pleura: Moderate emphysema again demonstrated. Previous right upper lobectomy and bilateral pleural- parenchymal scarring again noted. Multiple ground-glass, part  solid, and solid pulmonary nodules are again seen throughout both lungs. Dominant solid nodule in the superior left lower lobe is stable measuring 2.5 x 2.1 cm on image 60/5 compared to 2.6 x 2.1 cm previously. Ill-defined solid nodule in the medial right lower lobe on image 79/5 is also stable measuring 2.0 x 0.7 cm. Another irregular solid nodule in the superior right lower lobe measures 1.3 x 0.7 cm on image 54/5 and is stable. Other part solid and ground-glass nodular opacities in both lungs remain stable. A new ill-defined pulmonary nodule is seen in the right middle lobe on image 51/5 which measures 6 x 7 mm. No other new or enlarging pulmonary nodules or masses are identified. No evidence of pleural effusion. Upper Abdomen: Normal adrenal glands. Stable small right hepatic lobe cyst. Musculoskeletal: No suspicious bone lesions or other significant abnormality. IMPRESSION: New 7 mm right middle lobe nodule which may be neoplastic, inflammatory, or infectious in etiology. Continued attention recommended on follow-up imaging. Other innumerable bilateral pulmonary nodules (solid, part solid, and ground-glass) show no significant change. No evidence of lymphadenopathy or pleural effusion. Moderate emphysema and bilateral pleural- parenchymal scarring. Aortic and coronary artery atherosclerosis. Electronically Signed   By: JEarle GellM.D.   On: 08/02/2016 14:10    ASSESSMENT AND PLAN: This is a very pleasant 75years old African-American female with recurrent non-small cell lung cancer, adenocarcinoma presented with multiple pulmonary nodules currently on treatment with Tarceva 100 mg by mouth daily status post more than 19 months. She is tolerating her  treatment fairly well with no significant adverse effects except for mild fatigue and few episodes of diarrhea. The recent CT scan of the chest showed some evidence for disease progression with enlarging left upper lobe nodule as well as enlarging multiple  bilateral groundglass and semisolid lesions. I personally reviewed the images. I discussed the results with the patient today. I recommended for her to discontinue treatment with Tarceva at this point.  She is currently on treatment with immunotherapy with Nivolumab status post 13 cycles and tolerating her treatment well.  I recommended for the patient to proceed with cycle #14 today as scheduled. I will see her back for follow-up visit in 2 weeks for reevaluation before starting cycle #15. For the financial assistance, I will refer the patient to the financial advocate at the [facility] for reevaluation of her condition. For the vitamin B 12 deficiency, she will continue on monthly vitamin B 12 injection. She was advised to call immediately if she has any concerning symptoms in the interval. The patient voices understanding of current disease status and treatment options and is in agreement with the current care plan.  All questions were answered. The patient knows to call the clinic with any problems, questions or concerns. We can certainly see the patient much sooner if necessary.  Disclaimer: This note was dictated with voice recognition software. Similar sounding words can inadvertently be transcribed and may not be corrected upon review.

## 2016-08-20 NOTE — Progress Notes (Signed)
Received staff message that patient had concerns with co-pay assistance program not able to assist her in 2018. Due to my workload, I was unable to see patient while in treatment, but did meet her in the hallway and invited her in to discuss. Patient brought the letter with her and I read it over and it did confirm that due to being out of funds, they would not be able to assist her in 2018. Reviewed notes and advised her that she may still apply for funds through [employer]. Printed the application off and had patient to complete her portion. Advised patient that I will leave for Lenise to review and complete the process and send to [employer] being that she is still covered this year under [employer]. Patient verbalized understanding and was very appreciative.

## 2016-08-28 ENCOUNTER — Encounter: Payer: Self-pay | Admitting: Internal Medicine

## 2016-08-28 NOTE — Progress Notes (Signed)
Pt is not eligible for copay assistance w/ [employer] because she has Medicare drug coverage.  Their program is for pt's w/ commercial insurance.  I informed pt of this so she will talk w/ Dr. Julien Nordmann on her next visit to see if she can take another drug.  I advised she can be set up on a payment plan or apply for a discount w/ the hospital if need be.  She verbalized understanding.

## 2016-09-03 ENCOUNTER — Other Ambulatory Visit: Payer: Medicare Other

## 2016-09-03 ENCOUNTER — Telehealth: Payer: Self-pay | Admitting: Emergency Medicine

## 2016-09-03 ENCOUNTER — Ambulatory Visit: Payer: Medicare Other

## 2016-09-03 ENCOUNTER — Ambulatory Visit: Payer: Medicare Other | Admitting: Internal Medicine

## 2016-09-03 NOTE — Telephone Encounter (Signed)
Patient called and left message stating that she wouldn't be able to come into the office today due to her having severe diarrhea this morning.   Returned patient's call; patient states diarrhea onset last night; states 5-6 episodes of diarrhea since this morning; states consistency is watery and mucous like. Denies any NV. States she can't hold her bowels and when she stands up she "has an accident". States abd cramping last evening. Last BM 1 hour ago;   Dr Julien Nordmann aware of above complaint; Called patient back and advised her to take 2 imodium now and to call this office if diarrhea persists throughout the day. Will cancel today's appointments and reschedule to later in the week. Patient verbalized understanding.

## 2016-09-17 ENCOUNTER — Ambulatory Visit (HOSPITAL_BASED_OUTPATIENT_CLINIC_OR_DEPARTMENT_OTHER): Payer: Medicare Other | Admitting: Internal Medicine

## 2016-09-17 ENCOUNTER — Telehealth: Payer: Self-pay | Admitting: *Deleted

## 2016-09-17 ENCOUNTER — Telehealth: Payer: Self-pay | Admitting: Internal Medicine

## 2016-09-17 ENCOUNTER — Other Ambulatory Visit (HOSPITAL_BASED_OUTPATIENT_CLINIC_OR_DEPARTMENT_OTHER): Payer: Medicare Other

## 2016-09-17 ENCOUNTER — Encounter: Payer: Self-pay | Admitting: Internal Medicine

## 2016-09-17 ENCOUNTER — Ambulatory Visit (HOSPITAL_BASED_OUTPATIENT_CLINIC_OR_DEPARTMENT_OTHER): Payer: Medicare Other

## 2016-09-17 VITALS — BP 140/66 | HR 72 | Temp 97.4°F | Resp 18 | Ht 65.0 in | Wt 139.3 lb

## 2016-09-17 DIAGNOSIS — C3492 Malignant neoplasm of unspecified part of left bronchus or lung: Secondary | ICD-10-CM | POA: Diagnosis not present

## 2016-09-17 DIAGNOSIS — R200 Anesthesia of skin: Secondary | ICD-10-CM

## 2016-09-17 DIAGNOSIS — C3491 Malignant neoplasm of unspecified part of right bronchus or lung: Secondary | ICD-10-CM

## 2016-09-17 DIAGNOSIS — R530 Neoplastic (malignant) related fatigue: Secondary | ICD-10-CM

## 2016-09-17 DIAGNOSIS — Z5112 Encounter for antineoplastic immunotherapy: Secondary | ICD-10-CM

## 2016-09-17 DIAGNOSIS — E538 Deficiency of other specified B group vitamins: Secondary | ICD-10-CM

## 2016-09-17 DIAGNOSIS — R5382 Chronic fatigue, unspecified: Secondary | ICD-10-CM

## 2016-09-17 LAB — CBC WITH DIFFERENTIAL/PLATELET
BASO%: 0.6 % (ref 0.0–2.0)
Basophils Absolute: 0.1 10*3/uL (ref 0.0–0.1)
EOS ABS: 0.3 10*3/uL (ref 0.0–0.5)
EOS%: 3.3 % (ref 0.0–7.0)
HCT: 39.2 % (ref 34.8–46.6)
HEMOGLOBIN: 12.9 g/dL (ref 11.6–15.9)
LYMPH%: 24.6 % (ref 14.0–49.7)
MCH: 29 pg (ref 25.1–34.0)
MCHC: 32.9 g/dL (ref 31.5–36.0)
MCV: 88.1 fL (ref 79.5–101.0)
MONO#: 0.5 10*3/uL (ref 0.1–0.9)
MONO%: 5.7 % (ref 0.0–14.0)
NEUT%: 65.8 % (ref 38.4–76.8)
NEUTROS ABS: 5.5 10*3/uL (ref 1.5–6.5)
PLATELETS: 225 10*3/uL (ref 145–400)
RBC: 4.45 10*6/uL (ref 3.70–5.45)
RDW: 14.3 % (ref 11.2–14.5)
WBC: 8.4 10*3/uL (ref 3.9–10.3)
lymph#: 2.1 10*3/uL (ref 0.9–3.3)

## 2016-09-17 LAB — COMPREHENSIVE METABOLIC PANEL
ALBUMIN: 3.8 g/dL (ref 3.5–5.0)
ALK PHOS: 85 U/L (ref 40–150)
ALT: 12 U/L (ref 0–55)
ANION GAP: 10 meq/L (ref 3–11)
AST: 13 U/L (ref 5–34)
BILIRUBIN TOTAL: 0.41 mg/dL (ref 0.20–1.20)
BUN: 14.1 mg/dL (ref 7.0–26.0)
CO2: 25 meq/L (ref 22–29)
CREATININE: 1.2 mg/dL — AB (ref 0.6–1.1)
Calcium: 10.1 mg/dL (ref 8.4–10.4)
Chloride: 106 mEq/L (ref 98–109)
EGFR: 49 mL/min/{1.73_m2} — AB (ref >90)
GLUCOSE: 88 mg/dL (ref 70–140)
Potassium: 3.9 mEq/L (ref 3.5–5.1)
SODIUM: 140 meq/L (ref 136–145)
TOTAL PROTEIN: 7.4 g/dL (ref 6.4–8.3)

## 2016-09-17 MED ORDER — CYANOCOBALAMIN 1000 MCG/ML IJ SOLN
1000.0000 ug | Freq: Once | INTRAMUSCULAR | Status: AC
  Administered 2016-09-17: 1000 ug via INTRAMUSCULAR

## 2016-09-17 MED ORDER — SODIUM CHLORIDE 0.9 % IV SOLN
Freq: Once | INTRAVENOUS | Status: AC
  Administered 2016-09-17: 13:00:00 via INTRAVENOUS

## 2016-09-17 MED ORDER — CYANOCOBALAMIN 1000 MCG/ML IJ SOLN
INTRAMUSCULAR | Status: AC
  Filled 2016-09-17: qty 1

## 2016-09-17 MED ORDER — HEPARIN SOD (PORK) LOCK FLUSH 100 UNIT/ML IV SOLN
500.0000 [IU] | Freq: Once | INTRAVENOUS | Status: AC | PRN
  Administered 2016-09-17: 500 [IU]
  Filled 2016-09-17: qty 5

## 2016-09-17 MED ORDER — SODIUM CHLORIDE 0.9% FLUSH
10.0000 mL | INTRAVENOUS | Status: DC | PRN
  Administered 2016-09-17: 10 mL
  Filled 2016-09-17: qty 10

## 2016-09-17 MED ORDER — SODIUM CHLORIDE 0.9 % IV SOLN
240.0000 mg | Freq: Once | INTRAVENOUS | Status: AC
  Administered 2016-09-17: 240 mg via INTRAVENOUS
  Filled 2016-09-17: qty 20

## 2016-09-17 NOTE — Telephone Encounter (Signed)
Message sent to chemo scheduler to be added per 09/17/16 los. Appointments scheduled per 09/17/16 los.  Appointment schedule mailed to patient.

## 2016-09-17 NOTE — Progress Notes (Signed)
[facility] [phone]   [phone]  OFFICE PROGRESS NOTE  [name], NP [phone] [place] [address]  DIAGNOSIS AND STAGE:  #1 Recurrent non-small cell lung cancer consistent with adenocarcinoma. This was initially diagnosed as stage IB (T2a N0 M0) well-differentiated adenocarcinoma of bronchioalveolar type in December 2010.  #2 Acute pulmonary embolism in the right lower lobe lobar, segmental and subsegmental sized pulmonary arteries, diagnosed on 11/04/2011.   PRIOR THERAPY:  1.Status post right upper lobectomy on September 28, 2009 under the care of Dr. Arlyce Dice. The patient refused adjuvant chemotherapy at that time. She had disease recurrence in June of 2012.  2.Status post 4 cycles of systemic chemotherapy with carboplatin for an AUC of 6 and paclitaxel at 200 mg per meter squared and Avastin at 15 mg/kg according to the ECOG protocol #5508.  3. Chemotherapy with Avastin 15 mg/kg according to the ECOG protocol #5508 status post 32 cycles. Last cycle was given on 06/30/2013 discontinued secondary to disease progression and persistent proteinuria. 4. Tarceva 150 mg by mouth daily, started 08/09/2013, status post 10 months of treatment. 5. Tarceva 100 mg by mouth daily started 07/02/2014, status post 19 months, discontinued secondary to disease progression.   CURRENT THERAPY: Treatment with immunotherapy with Nivolumab 240 MG IV every 2 weeks. Status post 14 cycles. First cycle was given on 02/20/2016.  CHEMOTHERAPY INTENT: Palliative.  CURRENT # OF CHEMOTHERAPY CYCLES: 15 CURRENT ANTIEMETICS: Compazine  CURRENT SMOKING STATUS: Non-smoker  ORAL CHEMOTHERAPY AND CONSENT: Tarceva and consent was signed 07/21/2013.  CURRENT BISPHOSPHONATES USE: None  PAIN MANAGEMENT: No pain  NARCOTICS INDUCED CONSTIPATION: No constipation.  LIVING WILL AND CODE STATUS: Full code  INTERVAL HISTORY: [name] 75 y.o. female returns to  the clinic today for follow-up visit. The patient is feeling fine today with no specific complaints except for mild fatigue. She missed her treatment 2 weeks ago secondary to several episodes of diarrhea which resolved the day later. She denied having any chest pain but continues to have shortness of breath with exertion with no cough or hemoptysis. She has no nausea or vomiting, no fever or chills. She has no current diarrhea. She is here today for evaluation before starting cycle #15. She is concerned about lack of financial support to pay for her treatment starting 10/14/2016.  MEDICAL HISTORY: Past Medical History:  Diagnosis Date  . Bilateral lung cancer ([place]) 03/08/2011   recurrent  . Chronic fatigue 02/14/2016  . Encounter for antineoplastic immunotherapy 02/14/2016  . Foot pain 09/02/2011  . Glaucoma   . Hip pain 09/02/2011  . Hypercholesterolemia   . Hypertension   . Hypothyroidism   . Lung cancer ([facility]) 03/08/2011   recurrent    ALLERGIES:  is allergic to cymbalta [duloxetine hcl]; fish allergy; amitriptyline; codeine; and sulfa antibiotics.  MEDICATIONS:  Current Outpatient Prescriptions  Medication Sig Dispense Refill  . amLODipine (NORVASC) 5 MG tablet Take 10 mg by mouth daily. Per [name], NP    . aspirin 81 MG tablet Take 81 mg by mouth daily.    . clonazePAM (KLONOPIN) 0.5 MG tablet Take 0.5 mg by mouth 2 (two) times daily as needed. Per [name], NP    . cyanocobalamin (,VITAMIN B-12,) 1000 MCG/ML injection Inject 1,000 mcg into the muscle every 30 (thirty) days.    . dorzolamide-timolol (COSOPT) 22.3-6.8 MG/ML ophthalmic solution Place 2 drops into both eyes 2 (two) times daily. Per [name], NP    .  fluticasone (FLONASE) 50 MCG/ACT nasal spray Place 1 spray into both nostrils daily as needed for allergies or rhinitis.    Marland Kitchen levothyroxine (SYNTHROID, LEVOTHROID) 88 MCG tablet Take 88 mcg by mouth daily before breakfast.    . lidocaine-prilocaine (EMLA) cream  Apply 1 application topically as needed (prior to port access). 30 g 0  . lisinopril (PRINIVIL,ZESTRIL) 40 MG tablet Take 40 mg by mouth daily. Per [name], NP     . MYRBETRIQ 50 MG TB24 tablet Take 50 mg by mouth daily. Reported on 04/17/2016  3  . nitrofurantoin (MACRODANTIN) 50 MG capsule Take 50 mg by mouth daily.  2   No current facility-administered medications for this visit.     SURGICAL HISTORY:  Past Surgical History:  Procedure Laterality Date  . LUNG LOBECTOMY  09/28/2009   right upper  . THORACOTOMY  09/28/2009   mini  . VIDEO ASSISTED THORACOSCOPY  09/28/2009    REVIEW OF SYSTEMS:  A comprehensive review of systems was negative except for: Constitutional: positive for fatigue   PHYSICAL EXAMINATION: General appearance: alert, cooperative, fatigued and no distress Head: Normocephalic, without obvious abnormality, atraumatic Neck: no adenopathy, no JVD, supple, symmetrical, trachea midline and thyroid not enlarged, symmetric, no tenderness/mass/nodules Lymph nodes: Cervical, supraclavicular, and axillary nodes normal. Resp: clear to auscultation bilaterally Back: symmetric, no curvature. ROM normal. No CVA tenderness. Cardio: regular rate and rhythm, S1, S2 normal, no murmur, click, rub or gallop GI: soft, non-tender; bowel sounds normal; no masses,  no organomegaly Extremities: extremities normal, atraumatic, no cyanosis or edema  ECOG PERFORMANCE STATUS: 1 - Symptomatic but completely ambulatory  Blood pressure 140/66, pulse 72, temperature 97.4 F (36.3 C), temperature source Oral, resp. rate 18, height '5\' 5"'$  (1.651 m), weight 139 lb 4.8 oz (63.2 kg), SpO2 100 %.  LABORATORY DATA: Lab Results  Component Value Date   WBC 8.4 09/17/2016   HGB 12.9 09/17/2016   HCT 39.2 09/17/2016   MCV 88.1 09/17/2016   PLT 225 09/17/2016      Chemistry      Component Value Date/Time   NA 140 09/17/2016 1019   K 3.9 09/17/2016 1019   CL 105 01/11/2015 1603   CL 107  03/30/2013 1036   CO2 25 09/17/2016 1019   BUN 14.1 09/17/2016 1019   CREATININE 1.2 (H) 09/17/2016 1019      Component Value Date/Time   CALCIUM 10.1 09/17/2016 1019   ALKPHOS 85 09/17/2016 1019   AST 13 09/17/2016 1019   ALT 12 09/17/2016 1019   BILITOT 0.41 09/17/2016 1019       RADIOGRAPHIC STUDIES: No results found.  ASSESSMENT AND PLAN: This is a very pleasant 75 years old African-American female with recurrent non-small cell lung cancer status post several chemotherapy regimen. She is currently on treatment with Nivolumab 240 MG intravenously every 2 weeks. The patient is tolerating the treatment well with no significant adverse effects. I recommended for her to proceed with cycle #15 today as a scheduled. For the financial assistance with her treatment, she is followed by the managed care office to help with this issue. She was advised to call immediately if she has any concerning symptoms in the interval. The patient voices understanding of current disease status and treatment options and is in agreement with the current care plan.  All questions were answered. The patient knows to call the clinic with any problems, questions or concerns. We can certainly see the patient much sooner if necessary.  I spent  10 minutes counseling the patient face to face. The total time spent in the appointment was 15 minutes.  Disclaimer: This note was dictated with voice recognition software. Similar sounding words can inadvertently be transcribed and may not be corrected upon review.

## 2016-09-17 NOTE — Telephone Encounter (Signed)
Per LOS I have scheduled appts and notified the scheeduler

## 2016-09-17 NOTE — Patient Instructions (Signed)
[facility] Discharge Instructions for Patients Receiving Chemotherapy  Today you received the following chemotherapy agents:  Nivolumab.  To help prevent nausea and vomiting after your treatment, we encourage you to take your nausea medication as directed.   If you develop nausea and vomiting that is not controlled by your nausea medication, call the clinic.   BELOW ARE SYMPTOMS THAT SHOULD BE REPORTED IMMEDIATELY:  *FEVER GREATER THAN 100.5 F  *CHILLS WITH OR WITHOUT FEVER  NAUSEA AND VOMITING THAT IS NOT CONTROLLED WITH YOUR NAUSEA MEDICATION  *UNUSUAL SHORTNESS OF BREATH  *UNUSUAL BRUISING OR BLEEDING  TENDERNESS IN MOUTH AND THROAT WITH OR WITHOUT PRESENCE OF ULCERS  *URINARY PROBLEMS  *BOWEL PROBLEMS  UNUSUAL RASH Items with * indicate a potential emergency and should be followed up as soon as possible.  Feel free to call the clinic you have any questions or concerns. The clinic phone number is [phone].  Please show the CHEMO ALERT CARD at check-in to the Emergency Department and triage nurse.   

## 2016-10-01 ENCOUNTER — Other Ambulatory Visit (HOSPITAL_BASED_OUTPATIENT_CLINIC_OR_DEPARTMENT_OTHER): Payer: Medicare Other

## 2016-10-01 ENCOUNTER — Telehealth: Payer: Self-pay | Admitting: Internal Medicine

## 2016-10-01 ENCOUNTER — Encounter: Payer: Self-pay | Admitting: Internal Medicine

## 2016-10-01 ENCOUNTER — Ambulatory Visit (HOSPITAL_BASED_OUTPATIENT_CLINIC_OR_DEPARTMENT_OTHER): Payer: Medicare Other | Admitting: Internal Medicine

## 2016-10-01 ENCOUNTER — Ambulatory Visit (HOSPITAL_BASED_OUTPATIENT_CLINIC_OR_DEPARTMENT_OTHER): Payer: Medicare Other

## 2016-10-01 VITALS — BP 148/55 | HR 78 | Temp 97.5°F | Resp 17 | Ht 65.0 in | Wt 140.2 lb

## 2016-10-01 DIAGNOSIS — C3492 Malignant neoplasm of unspecified part of left bronchus or lung: Principal | ICD-10-CM

## 2016-10-01 DIAGNOSIS — C3491 Malignant neoplasm of unspecified part of right bronchus or lung: Secondary | ICD-10-CM

## 2016-10-01 DIAGNOSIS — Z5112 Encounter for antineoplastic immunotherapy: Secondary | ICD-10-CM

## 2016-10-01 LAB — COMPREHENSIVE METABOLIC PANEL
ALBUMIN: 4 g/dL (ref 3.5–5.0)
ALK PHOS: 84 U/L (ref 40–150)
ALT: 15 U/L (ref 0–55)
AST: 12 U/L (ref 5–34)
Anion Gap: 9 mEq/L (ref 3–11)
BUN: 17.6 mg/dL (ref 7.0–26.0)
CO2: 25 mEq/L (ref 22–29)
CREATININE: 1.2 mg/dL — AB (ref 0.6–1.1)
Calcium: 10.4 mg/dL (ref 8.4–10.4)
Chloride: 107 mEq/L (ref 98–109)
EGFR: 49 mL/min/{1.73_m2} — ABNORMAL LOW (ref >90)
GLUCOSE: 87 mg/dL (ref 70–140)
POTASSIUM: 4 meq/L (ref 3.5–5.1)
SODIUM: 140 meq/L (ref 136–145)
TOTAL PROTEIN: 7.8 g/dL (ref 6.4–8.3)
Total Bilirubin: 0.44 mg/dL (ref 0.20–1.20)

## 2016-10-01 LAB — CBC WITH DIFFERENTIAL/PLATELET
BASO%: 0.7 % (ref 0.0–2.0)
Basophils Absolute: 0.1 10*3/uL (ref 0.0–0.1)
EOS%: 3.9 % (ref 0.0–7.0)
Eosinophils Absolute: 0.4 10*3/uL (ref 0.0–0.5)
HCT: 38.8 % (ref 34.8–46.6)
HEMOGLOBIN: 13 g/dL (ref 11.6–15.9)
LYMPH#: 2.1 10*3/uL (ref 0.9–3.3)
LYMPH%: 23.4 % (ref 14.0–49.7)
MCH: 29.1 pg (ref 25.1–34.0)
MCHC: 33.5 g/dL (ref 31.5–36.0)
MCV: 87 fL (ref 79.5–101.0)
MONO#: 0.5 10*3/uL (ref 0.1–0.9)
MONO%: 6 % (ref 0.0–14.0)
NEUT%: 66 % (ref 38.4–76.8)
NEUTROS ABS: 5.9 10*3/uL (ref 1.5–6.5)
NRBC: 0 % (ref 0–0)
Platelets: 209 10*3/uL (ref 145–400)
RBC: 4.46 10*6/uL (ref 3.70–5.45)
RDW: 14.1 % (ref 11.2–14.5)
WBC: 9 10*3/uL (ref 3.9–10.3)

## 2016-10-01 MED ORDER — SODIUM CHLORIDE 0.9 % IV SOLN
Freq: Once | INTRAVENOUS | Status: AC
  Administered 2016-10-01: 12:00:00 via INTRAVENOUS

## 2016-10-01 MED ORDER — SODIUM CHLORIDE 0.9% FLUSH
10.0000 mL | INTRAVENOUS | Status: DC | PRN
  Administered 2016-10-01: 10 mL
  Filled 2016-10-01: qty 10

## 2016-10-01 MED ORDER — HEPARIN SOD (PORK) LOCK FLUSH 100 UNIT/ML IV SOLN
500.0000 [IU] | Freq: Once | INTRAVENOUS | Status: AC | PRN
  Administered 2016-10-01: 500 [IU]
  Filled 2016-10-01: qty 5

## 2016-10-01 MED ORDER — SODIUM CHLORIDE 0.9 % IV SOLN
240.0000 mg | Freq: Once | INTRAVENOUS | Status: AC
  Administered 2016-10-01: 240 mg via INTRAVENOUS
  Filled 2016-10-01: qty 20

## 2016-10-01 NOTE — Telephone Encounter (Signed)
Patient already on schedule q2w thru February. Gave patient avs report and appointments for January and December.

## 2016-10-01 NOTE — Patient Instructions (Signed)
[facility] Discharge Instructions for Patients Receiving Chemotherapy  Today you received the following chemotherapy agents:  Nivolumab.  To help prevent nausea and vomiting after your treatment, we encourage you to take your nausea medication as directed.   If you develop nausea and vomiting that is not controlled by your nausea medication, call the clinic.   BELOW ARE SYMPTOMS THAT SHOULD BE REPORTED IMMEDIATELY:  *FEVER GREATER THAN 100.5 F  *CHILLS WITH OR WITHOUT FEVER  NAUSEA AND VOMITING THAT IS NOT CONTROLLED WITH YOUR NAUSEA MEDICATION  *UNUSUAL SHORTNESS OF BREATH  *UNUSUAL BRUISING OR BLEEDING  TENDERNESS IN MOUTH AND THROAT WITH OR WITHOUT PRESENCE OF ULCERS  *URINARY PROBLEMS  *BOWEL PROBLEMS  UNUSUAL RASH Items with * indicate a potential emergency and should be followed up as soon as possible.  Feel free to call the clinic you have any questions or concerns. The clinic phone number is [phone].  Please show the CHEMO ALERT CARD at check-in to the Emergency Department and triage nurse.   

## 2016-10-01 NOTE — Progress Notes (Signed)
[facility] [phone]   Fax:([phone]  OFFICE PROGRESS NOTE  [name], NP [phone] [place] [address]  DIAGNOSIS AND STAGE:  #1 Recurrent non-small cell lung cancer consistent with adenocarcinoma. This was initially diagnosed as stage IB (T2a N0 M0) well-differentiated adenocarcinoma of bronchioalveolar type in December 2010.  #2 Acute pulmonary embolism in the right lower lobe lobar, segmental and subsegmental sized pulmonary arteries, diagnosed on 11/04/2011.   PRIOR THERAPY:  1.Status post right upper lobectomy on September 28, 2009 under the care of Dr. Arlyce Dice. The patient refused adjuvant chemotherapy at that time. She had disease recurrence in June of 2012.  2.Status post 4 cycles of systemic chemotherapy with carboplatin for an AUC of 6 and paclitaxel at 200 mg per meter squared and Avastin at 15 mg/kg according to the ECOG protocol #5508.  3. Chemotherapy with Avastin 15 mg/kg according to the ECOG protocol #5508 status post 32 cycles. Last cycle was given on 06/30/2013 discontinued secondary to disease progression and persistent proteinuria. 4. Tarceva 150 mg by mouth daily, started 08/09/2013, status post 10 months of treatment. 5. Tarceva 100 mg by mouth daily started 07/02/2014, status post 19 months, discontinued secondary to disease progression.   CURRENT THERAPY: Treatment with immunotherapy with Nivolumab 240 MG IV every 2 weeks. Status post 15 cycles. First cycle was given on 02/20/2016.  CHEMOTHERAPY INTENT: Palliative.  CURRENT # OF CHEMOTHERAPY CYCLES: 16 CURRENT ANTIEMETICS: Compazine  CURRENT SMOKING STATUS: Non-smoker  ORAL CHEMOTHERAPY AND CONSENT: Tarceva and consent was signed 07/21/2013.  CURRENT BISPHOSPHONATES USE: None  PAIN MANAGEMENT: No pain  NARCOTICS INDUCED CONSTIPATION: No constipation.  LIVING WILL AND CODE STATUS: Full code  INTERVAL HISTORY: [name] 75 y.o. female came to the  clinic today for follow-up visit. She tolerated the last cycle of her treatment well with no significant adverse effects. Her diarrhea has significantly improved. She denied having any chest pain, shortness of breath, cough or hemoptysis. She has no nausea or vomiting, no fever or chills. She contacted [employer] [facility] regarding her treatment with Nivolumab and they are willing to assist her with the medication coverage. She is welcome on her application. She is here today for evaluation before starting cycle #16.  MEDICAL HISTORY: Past Medical History:  Diagnosis Date  . Bilateral lung cancer ([place]) 03/08/2011   recurrent  . Chronic fatigue 02/14/2016  . Encounter for antineoplastic immunotherapy 02/14/2016  . Foot pain 09/02/2011  . Glaucoma   . Hip pain 09/02/2011  . Hypercholesterolemia   . Hypertension   . Hypothyroidism   . Lung cancer ([place]) 03/08/2011   recurrent    ALLERGIES:  is allergic to cymbalta [duloxetine hcl]; fish allergy; amitriptyline; codeine; and sulfa antibiotics.  MEDICATIONS:  Current Outpatient Prescriptions  Medication Sig Dispense Refill  . amLODipine (NORVASC) 5 MG tablet Take 10 mg by mouth daily. Per [name], NP    . aspirin 81 MG tablet Take 81 mg by mouth daily.    . clonazePAM (KLONOPIN) 0.5 MG tablet Take 0.5 mg by mouth 2 (two) times daily as needed. Per [name], NP    . cyanocobalamin (,VITAMIN B-12,) 1000 MCG/ML injection Inject 1,000 mcg into the muscle every 30 (thirty) days.    . dorzolamide-timolol (COSOPT) 22.3-6.8 MG/ML ophthalmic solution Place 2 drops into both eyes 2 (two) times daily. Per [name], NP    . fluticasone (FLONASE) 50 MCG/ACT nasal spray Place 1 spray into both nostrils daily  as needed for allergies or rhinitis.    Marland Kitchen levothyroxine (SYNTHROID, LEVOTHROID) 88 MCG tablet Take 88 mcg by mouth daily before breakfast.    . lidocaine-prilocaine (EMLA) cream Apply 1 application topically as needed (prior to port access).  30 g 0  . lisinopril (PRINIVIL,ZESTRIL) 40 MG tablet Take 40 mg by mouth daily. Per [name], NP     . nitrofurantoin (MACRODANTIN) 50 MG capsule Take 50 mg by mouth daily.  2  . MYRBETRIQ 50 MG TB24 tablet Take 50 mg by mouth daily. Reported on 04/17/2016  3   No current facility-administered medications for this visit.     SURGICAL HISTORY:  Past Surgical History:  Procedure Laterality Date  . LUNG LOBECTOMY  09/28/2009   right upper  . THORACOTOMY  09/28/2009   mini  . VIDEO ASSISTED THORACOSCOPY  09/28/2009    REVIEW OF SYSTEMS:  A comprehensive review of systems was negative.   PHYSICAL EXAMINATION: General appearance: alert, cooperative and no distress Head: Normocephalic, without obvious abnormality, atraumatic Neck: no adenopathy, no JVD, supple, symmetrical, trachea midline and thyroid not enlarged, symmetric, no tenderness/mass/nodules Lymph nodes: Cervical, supraclavicular, and axillary nodes normal. Resp: clear to auscultation bilaterally Back: symmetric, no curvature. ROM normal. No CVA tenderness. Cardio: regular rate and rhythm, S1, S2 normal, no murmur, click, rub or gallop GI: soft, non-tender; bowel sounds normal; no masses,  no organomegaly Extremities: extremities normal, atraumatic, no cyanosis or edema  ECOG PERFORMANCE STATUS: 1 - Symptomatic but completely ambulatory  Blood pressure (!) 148/55, pulse 78, temperature 97.5 F (36.4 C), temperature source Oral, resp. rate 17, height '5\' 5"'$  (1.651 m), weight 140 lb 3.2 oz (63.6 kg), SpO2 97 %.  LABORATORY DATA: Lab Results  Component Value Date   WBC 9.0 10/01/2016   HGB 13.0 10/01/2016   HCT 38.8 10/01/2016   MCV 87.0 10/01/2016   PLT 209 10/01/2016      Chemistry      Component Value Date/Time   NA 140 10/01/2016 1044   K 4.0 10/01/2016 1044   CL 105 01/11/2015 1603   CL 107 03/30/2013 1036   CO2 25 10/01/2016 1044   BUN 17.6 10/01/2016 1044   CREATININE 1.2 (H) 10/01/2016 1044        Component Value Date/Time   CALCIUM 10.4 10/01/2016 1044   ALKPHOS 84 10/01/2016 1044   AST 12 10/01/2016 1044   ALT 15 10/01/2016 1044   BILITOT 0.44 10/01/2016 1044       RADIOGRAPHIC STUDIES: No results found.  ASSESSMENT AND PLAN: This is a very pleasant 75 years old African-American female with recurrent non-small cell lung cancer, adenocarcinoma. She is status post several chemotherapy regimens. She is currently on treatment with immunotherapy with Nivolumab 240 mg IV every 2 weeks status post 15 cycles.  She is tolerating the treatment well with no significant adverse effects. I recommended for her to proceed with cycle #16 today as scheduled. She will come back for follow-up visit in 2 weeks for evaluation before starting cycle #17. The patient was advised to call immediately if she has any concerning symptoms in the interval. The patient voices understanding of current disease status and treatment options and is in agreement with the current care plan. All questions were answered. The patient knows to call the clinic with any problems, questions or concerns. We can certainly see the patient much sooner if necessary. I spent 10 minutes counseling the patient face to face. The total time spent in the appointment  was 15 minutes. Disclaimer: This note was dictated with voice recognition software. Similar sounding words can inadvertently be transcribed and may not be corrected upon review.

## 2016-10-02 ENCOUNTER — Encounter: Payer: Self-pay | Admitting: Internal Medicine

## 2016-10-02 NOTE — Progress Notes (Signed)
Pt would like to apply to [employer] for financial assistance for Opdivo.  She completed the application and I faxed it along with her proof of income to [employer] today.

## 2016-10-03 ENCOUNTER — Encounter: Payer: Self-pay | Admitting: Internal Medicine

## 2016-10-03 NOTE — Progress Notes (Signed)
Received letter from [facility] regarding assistance for pt's Opdiv.  Based on the info provided, pt does not qualify for assistance.  They will review her application in Jan 8372.

## 2016-10-15 ENCOUNTER — Ambulatory Visit (HOSPITAL_BASED_OUTPATIENT_CLINIC_OR_DEPARTMENT_OTHER): Payer: Medicare Other | Admitting: Internal Medicine

## 2016-10-15 ENCOUNTER — Other Ambulatory Visit (HOSPITAL_BASED_OUTPATIENT_CLINIC_OR_DEPARTMENT_OTHER): Payer: Medicare Other

## 2016-10-15 ENCOUNTER — Telehealth: Payer: Self-pay | Admitting: Internal Medicine

## 2016-10-15 ENCOUNTER — Encounter: Payer: Self-pay | Admitting: Internal Medicine

## 2016-10-15 ENCOUNTER — Ambulatory Visit (HOSPITAL_BASED_OUTPATIENT_CLINIC_OR_DEPARTMENT_OTHER): Payer: Medicare Other

## 2016-10-15 VITALS — BP 154/72 | HR 86 | Temp 97.5°F | Resp 16 | Wt 141.9 lb

## 2016-10-15 DIAGNOSIS — C3492 Malignant neoplasm of unspecified part of left bronchus or lung: Secondary | ICD-10-CM

## 2016-10-15 DIAGNOSIS — Z5112 Encounter for antineoplastic immunotherapy: Secondary | ICD-10-CM

## 2016-10-15 DIAGNOSIS — C3491 Malignant neoplasm of unspecified part of right bronchus or lung: Secondary | ICD-10-CM

## 2016-10-15 DIAGNOSIS — Z79899 Other long term (current) drug therapy: Secondary | ICD-10-CM | POA: Diagnosis not present

## 2016-10-15 DIAGNOSIS — E538 Deficiency of other specified B group vitamins: Secondary | ICD-10-CM

## 2016-10-15 DIAGNOSIS — R200 Anesthesia of skin: Secondary | ICD-10-CM

## 2016-10-15 LAB — CBC WITH DIFFERENTIAL/PLATELET
BASO%: 1.2 % (ref 0.0–2.0)
BASOS ABS: 0.1 10*3/uL (ref 0.0–0.1)
EOS ABS: 0.3 10*3/uL (ref 0.0–0.5)
EOS%: 4 % (ref 0.0–7.0)
HCT: 39.1 % (ref 34.8–46.6)
HGB: 12.9 g/dL (ref 11.6–15.9)
LYMPH%: 19.6 % (ref 14.0–49.7)
MCH: 29 pg (ref 25.1–34.0)
MCHC: 33.1 g/dL (ref 31.5–36.0)
MCV: 87.6 fL (ref 79.5–101.0)
MONO#: 0.6 10*3/uL (ref 0.1–0.9)
MONO%: 7.2 % (ref 0.0–14.0)
NEUT%: 68 % (ref 38.4–76.8)
NEUTROS ABS: 5.8 10*3/uL (ref 1.5–6.5)
PLATELETS: 233 10*3/uL (ref 145–400)
RBC: 4.46 10*6/uL (ref 3.70–5.45)
RDW: 14 % (ref 11.2–14.5)
WBC: 8.6 10*3/uL (ref 3.9–10.3)
lymph#: 1.7 10*3/uL (ref 0.9–3.3)

## 2016-10-15 LAB — COMPREHENSIVE METABOLIC PANEL
ALBUMIN: 4.1 g/dL (ref 3.5–5.0)
ALK PHOS: 80 U/L (ref 40–150)
ALT: 13 U/L (ref 0–55)
ANION GAP: 11 meq/L (ref 3–11)
AST: 12 U/L (ref 5–34)
BILIRUBIN TOTAL: 0.41 mg/dL (ref 0.20–1.20)
BUN: 14.1 mg/dL (ref 7.0–26.0)
CO2: 23 meq/L (ref 22–29)
Calcium: 10.6 mg/dL — ABNORMAL HIGH (ref 8.4–10.4)
Chloride: 106 mEq/L (ref 98–109)
Creatinine: 1.3 mg/dL — ABNORMAL HIGH (ref 0.6–1.1)
EGFR: 47 mL/min/{1.73_m2} — AB (ref >90)
Glucose: 85 mg/dl (ref 70–140)
POTASSIUM: 4.1 meq/L (ref 3.5–5.1)
Sodium: 140 mEq/L (ref 136–145)
TOTAL PROTEIN: 7.8 g/dL (ref 6.4–8.3)

## 2016-10-15 LAB — TSH: TSH: 0.216 m(IU)/L — ABNORMAL LOW (ref 0.308–3.960)

## 2016-10-15 MED ORDER — SODIUM CHLORIDE 0.9 % IV SOLN
Freq: Once | INTRAVENOUS | Status: AC
  Administered 2016-10-15: 10:00:00 via INTRAVENOUS

## 2016-10-15 MED ORDER — CYANOCOBALAMIN 1000 MCG/ML IJ SOLN
INTRAMUSCULAR | Status: AC
  Filled 2016-10-15: qty 1

## 2016-10-15 MED ORDER — SODIUM CHLORIDE 0.9 % IV SOLN
240.0000 mg | Freq: Once | INTRAVENOUS | Status: AC
  Administered 2016-10-15: 240 mg via INTRAVENOUS
  Filled 2016-10-15: qty 20

## 2016-10-15 MED ORDER — HEPARIN SOD (PORK) LOCK FLUSH 100 UNIT/ML IV SOLN
500.0000 [IU] | Freq: Once | INTRAVENOUS | Status: AC | PRN
  Administered 2016-10-15: 500 [IU]
  Filled 2016-10-15: qty 5

## 2016-10-15 MED ORDER — CYANOCOBALAMIN 1000 MCG/ML IJ SOLN
1000.0000 ug | Freq: Once | INTRAMUSCULAR | Status: AC
  Administered 2016-10-15: 1000 ug via INTRAMUSCULAR

## 2016-10-15 MED ORDER — SODIUM CHLORIDE 0.9% FLUSH
10.0000 mL | INTRAVENOUS | Status: DC | PRN
  Administered 2016-10-15: 10 mL
  Filled 2016-10-15: qty 10

## 2016-10-15 NOTE — Patient Instructions (Signed)
[facility] Discharge Instructions for Patients Receiving Chemotherapy  Today you received the following chemotherapy agents:  Nivolumab.  To help prevent nausea and vomiting after your treatment, we encourage you to take your nausea medication as directed.   If you develop nausea and vomiting that is not controlled by your nausea medication, call the clinic.   BELOW ARE SYMPTOMS THAT SHOULD BE REPORTED IMMEDIATELY:  *FEVER GREATER THAN 100.5 F  *CHILLS WITH OR WITHOUT FEVER  NAUSEA AND VOMITING THAT IS NOT CONTROLLED WITH YOUR NAUSEA MEDICATION  *UNUSUAL SHORTNESS OF BREATH  *UNUSUAL BRUISING OR BLEEDING  TENDERNESS IN MOUTH AND THROAT WITH OR WITHOUT PRESENCE OF ULCERS  *URINARY PROBLEMS  *BOWEL PROBLEMS  UNUSUAL RASH Items with * indicate a potential emergency and should be followed up as soon as possible.  Feel free to call the clinic you have any questions or concerns. The clinic phone number is [phone].  Please show the CHEMO ALERT CARD at check-in to the Emergency Department and triage nurse.   

## 2016-10-15 NOTE — Telephone Encounter (Signed)
No additional appointments required, per follow up, lab and chemo scheduled every 2 weeks, throughout 12/10/16. Patient has copy of appointment schedule. Patient refused copy of AVS report, per 10/15/16

## 2016-10-15 NOTE — Progress Notes (Signed)
[place] [phone]   [phone]) [phone]  OFFICE PROGRESS NOTE  [name], NP [phone] [address] [place] [address]  DIAGNOSIS AND STAGE:  #1 Recurrent non-small cell lung cancer consistent with adenocarcinoma. This was initially diagnosed as stage IB (T2a N0 M0) well-differentiated adenocarcinoma of bronchioalveolar type in December 2010.  #2 Acute pulmonary embolism in the right lower lobe lobar, segmental and subsegmental sized pulmonary arteries, diagnosed on 11/04/2011.   PRIOR THERAPY:  1.Status post right upper lobectomy on September 28, 2009 under the care of Dr. Arlyce Dice. The patient refused adjuvant chemotherapy at that time. She had disease recurrence in June of 2012.  2.Status post 4 cycles of systemic chemotherapy with carboplatin for an AUC of 6 and paclitaxel at 200 mg per meter squared and Avastin at 15 mg/kg according to the ECOG protocol #5508.  3. Chemotherapy with Avastin 15 mg/kg according to the ECOG protocol #5508 status post 32 cycles. Last cycle was given on 06/30/2013 discontinued secondary to disease progression and persistent proteinuria. 4. Tarceva 150 mg by mouth daily, started 08/09/2013, status post 10 months of treatment. 5. Tarceva 100 mg by mouth daily started 07/02/2014, status post 19 months, discontinued secondary to disease progression.   CURRENT THERAPY: Treatment with immunotherapy with Nivolumab 240 MG IV every 2 weeks. Status post 16 cycles. First cycle was given on 02/20/2016.  CHEMOTHERAPY INTENT: Palliative.  CURRENT # OF CHEMOTHERAPY CYCLES: 17 CURRENT ANTIEMETICS: Compazine  CURRENT SMOKING STATUS: Non-smoker  ORAL CHEMOTHERAPY AND CONSENT: Tarceva and consent was signed 07/21/2013.  CURRENT BISPHOSPHONATES USE: None  PAIN MANAGEMENT: No pain  NARCOTICS INDUCED CONSTIPATION: No constipation.  LIVING WILL AND CODE STATUS: Full code  INTERVAL HISTORY: [name] 75 y.o. female returns to  the clinic today for follow-up visit. The patient is feeling fine today was no specific complaints. She was seen recently by her urologist and currently on treatment for urinary tract infection with nitrofurantoin. She denied having any chest pain, shortness of breath, cough or hemoptysis. She has no fever or chills. She denied having any weight loss or night sweats. She is here today for evaluation before starting cycle #17.  MEDICAL HISTORY: Past Medical History:  Diagnosis Date  . Bilateral lung cancer ([place]) 03/08/2011   recurrent  . Chronic fatigue 02/14/2016  . Encounter for antineoplastic immunotherapy 02/14/2016  . Foot pain 09/02/2011  . Glaucoma   . Hip pain 09/02/2011  . Hypercholesterolemia   . Hypertension   . Hypothyroidism   . Lung cancer ([place]) 03/08/2011   recurrent    ALLERGIES:  is allergic to cymbalta [duloxetine hcl]; fish allergy; amitriptyline; codeine; and sulfa antibiotics.  MEDICATIONS:  Current Outpatient Prescriptions  Medication Sig Dispense Refill  . amLODipine (NORVASC) 5 MG tablet Take 10 mg by mouth daily. Per [name], NP    . aspirin 81 MG tablet Take 81 mg by mouth daily.    . clonazePAM (KLONOPIN) 0.5 MG tablet Take 0.5 mg by mouth 2 (two) times daily as needed. Per [name], NP    . cyanocobalamin (,VITAMIN B-12,) 1000 MCG/ML injection Inject 1,000 mcg into the muscle every 30 (thirty) days.    . dorzolamide-timolol (COSOPT) 22.3-6.8 MG/ML ophthalmic solution Place 2 drops into both eyes 2 (two) times daily. Per [name], NP    . fluticasone (FLONASE) 50 MCG/ACT nasal spray Place 1 spray into both nostrils daily as needed for allergies or rhinitis.    Marland Kitchen levothyroxine (SYNTHROID, LEVOTHROID)  88 MCG tablet Take 88 mcg by mouth daily before breakfast.    . lidocaine-prilocaine (EMLA) cream Apply 1 application topically as needed (prior to port access). 30 g 0  . lisinopril (PRINIVIL,ZESTRIL) 40 MG tablet Take 40 mg by mouth daily. Per [name], NP     . MYRBETRIQ 50 MG TB24 tablet Take 50 mg by mouth daily. Reported on 04/17/2016  3  . nitrofurantoin (MACRODANTIN) 50 MG capsule Take 50 mg by mouth daily.  2   No current facility-administered medications for this visit.     SURGICAL HISTORY:  Past Surgical History:  Procedure Laterality Date  . LUNG LOBECTOMY  09/28/2009   right upper  . THORACOTOMY  09/28/2009   mini  . VIDEO ASSISTED THORACOSCOPY  09/28/2009    REVIEW OF SYSTEMS:  A comprehensive review of systems was negative.   PHYSICAL EXAMINATION: General appearance: alert, cooperative and no distress Head: Normocephalic, without obvious abnormality, atraumatic Neck: no adenopathy, no JVD, supple, symmetrical, trachea midline and thyroid not enlarged, symmetric, no tenderness/mass/nodules Lymph nodes: Cervical, supraclavicular, and axillary nodes normal. Resp: clear to auscultation bilaterally Back: symmetric, no curvature. ROM normal. No CVA tenderness. Cardio: regular rate and rhythm, S1, S2 normal, no murmur, click, rub or gallop GI: soft, non-tender; bowel sounds normal; no masses,  no organomegaly Extremities: extremities normal, atraumatic, no cyanosis or edema  ECOG PERFORMANCE STATUS: 1 - Symptomatic but completely ambulatory  Blood pressure (!) 154/72, pulse 86, temperature 97.5 F (36.4 C), temperature source Oral, resp. rate 16, weight 141 lb 14.4 oz (64.4 kg), SpO2 98 %.  LABORATORY DATA: Lab Results  Component Value Date   WBC 8.6 10/15/2016   HGB 12.9 10/15/2016   HCT 39.1 10/15/2016   MCV 87.6 10/15/2016   PLT 233 10/15/2016      Chemistry      Component Value Date/Time   NA 140 10/01/2016 1044   K 4.0 10/01/2016 1044   CL 105 01/11/2015 1603   CL 107 03/30/2013 1036   CO2 25 10/01/2016 1044   BUN 17.6 10/01/2016 1044   CREATININE 1.2 (H) 10/01/2016 1044      Component Value Date/Time   CALCIUM 10.4 10/01/2016 1044   ALKPHOS 84 10/01/2016 1044   AST 12 10/01/2016 1044   ALT  15 10/01/2016 1044   BILITOT 0.44 10/01/2016 1044       RADIOGRAPHIC STUDIES: No results found.  ASSESSMENT AND PLAN:  This is a very pleasant 76 years old African-American female with recurrent non-small cell lung cancer, adenocarcinoma. She is status post several chemotherapy regimens as well as palliative radiation. She is currently undergoing treatment with immunotherapy with Nivolumab status post 16 cycles. She is tolerating the treatment well with no concerning complaints. I recommended for the patient to proceed with cycle #17 today as a scheduled. She will come back for follow-up visit in 2 weeks for evaluation with repeat CT scan of the chest for restaging of her disease. The patient was advised to call immediately if she has any concerning symptoms in the interval. The patient voices understanding of current disease status and treatment options and is in agreement with the current care plan. All questions were answered. The patient knows to call the clinic with any problems, questions or concerns. We can certainly see the patient much sooner if necessary. I spent 10 minutes counseling the patient face to face. The total time spent in the appointment was 15 minutes. Disclaimer: This note was dictated with voice recognition  software. Similar sounding words can inadvertently be transcribed and may not be corrected upon review.

## 2016-10-23 ENCOUNTER — Encounter: Payer: Self-pay | Admitting: Medical Oncology

## 2016-10-24 ENCOUNTER — Encounter: Payer: Self-pay | Admitting: Internal Medicine

## 2016-10-24 NOTE — Progress Notes (Signed)
Faxed letter of medical necessity to [employer] for Opdivo in case pt would like to appeal the denial.

## 2016-10-25 ENCOUNTER — Ambulatory Visit (HOSPITAL_COMMUNITY)
Admission: RE | Admit: 2016-10-25 | Discharge: 2016-10-25 | Disposition: A | Discharge status: Home / Self Care | Payer: Medicare Other | Source: Ambulatory Visit | Attending: Internal Medicine | Admitting: Internal Medicine

## 2016-10-25 DIAGNOSIS — C3492 Malignant neoplasm of unspecified part of left bronchus or lung: Secondary | ICD-10-CM | POA: Insufficient documentation

## 2016-10-25 DIAGNOSIS — Z5112 Encounter for antineoplastic immunotherapy: Secondary | ICD-10-CM | POA: Insufficient documentation

## 2016-10-25 DIAGNOSIS — I700 Atherosclerosis of aorta: Secondary | ICD-10-CM | POA: Diagnosis not present

## 2016-10-25 DIAGNOSIS — C3491 Malignant neoplasm of unspecified part of right bronchus or lung: Secondary | ICD-10-CM | POA: Diagnosis not present

## 2016-10-25 DIAGNOSIS — I2510 Atherosclerotic heart disease of native coronary artery without angina pectoris: Secondary | ICD-10-CM | POA: Diagnosis not present

## 2016-10-25 MED ORDER — IOPAMIDOL (ISOVUE-300) INJECTION 61%
75.0000 mL | Freq: Once | INTRAVENOUS | Status: AC | PRN
  Administered 2016-10-25: 75 mL via INTRAVENOUS

## 2016-10-29 ENCOUNTER — Ambulatory Visit (HOSPITAL_BASED_OUTPATIENT_CLINIC_OR_DEPARTMENT_OTHER): Payer: Medicare Other

## 2016-10-29 ENCOUNTER — Encounter: Payer: Self-pay | Admitting: Internal Medicine

## 2016-10-29 ENCOUNTER — Ambulatory Visit (HOSPITAL_BASED_OUTPATIENT_CLINIC_OR_DEPARTMENT_OTHER): Payer: Medicare Other | Admitting: Internal Medicine

## 2016-10-29 ENCOUNTER — Other Ambulatory Visit (HOSPITAL_BASED_OUTPATIENT_CLINIC_OR_DEPARTMENT_OTHER): Payer: Medicare Other

## 2016-10-29 VITALS — BP 152/79 | HR 80 | Temp 97.5°F | Resp 18 | Ht 65.0 in | Wt 139.7 lb

## 2016-10-29 DIAGNOSIS — E039 Hypothyroidism, unspecified: Secondary | ICD-10-CM | POA: Diagnosis not present

## 2016-10-29 DIAGNOSIS — C3492 Malignant neoplasm of unspecified part of left bronchus or lung: Secondary | ICD-10-CM

## 2016-10-29 DIAGNOSIS — R350 Frequency of micturition: Secondary | ICD-10-CM

## 2016-10-29 DIAGNOSIS — Z5112 Encounter for antineoplastic immunotherapy: Secondary | ICD-10-CM

## 2016-10-29 DIAGNOSIS — I10 Essential (primary) hypertension: Secondary | ICD-10-CM

## 2016-10-29 DIAGNOSIS — C3491 Malignant neoplasm of unspecified part of right bronchus or lung: Secondary | ICD-10-CM

## 2016-10-29 HISTORY — DX: Essential (primary) hypertension: I10

## 2016-10-29 LAB — CBC WITH DIFFERENTIAL/PLATELET
BASO%: 0.6 % (ref 0.0–2.0)
BASOS ABS: 0.1 10*3/uL (ref 0.0–0.1)
EOS%: 3.4 % (ref 0.0–7.0)
Eosinophils Absolute: 0.3 10*3/uL (ref 0.0–0.5)
HEMATOCRIT: 39.3 % (ref 34.8–46.6)
HEMOGLOBIN: 12.9 g/dL (ref 11.6–15.9)
LYMPH#: 2.1 10*3/uL (ref 0.9–3.3)
LYMPH%: 25.9 % (ref 14.0–49.7)
MCH: 28.6 pg (ref 25.1–34.0)
MCHC: 32.8 g/dL (ref 31.5–36.0)
MCV: 87.1 fL (ref 79.5–101.0)
MONO#: 0.6 10*3/uL (ref 0.1–0.9)
MONO%: 7.3 % (ref 0.0–14.0)
NEUT%: 62.8 % (ref 38.4–76.8)
NEUTROS ABS: 5.2 10*3/uL (ref 1.5–6.5)
Platelets: 205 10*3/uL (ref 145–400)
RBC: 4.51 10*6/uL (ref 3.70–5.45)
RDW: 14.2 % (ref 11.2–14.5)
WBC: 8.3 10*3/uL (ref 3.9–10.3)

## 2016-10-29 LAB — COMPREHENSIVE METABOLIC PANEL
ALBUMIN: 4 g/dL (ref 3.5–5.0)
ALK PHOS: 78 U/L (ref 40–150)
ALT: 14 U/L (ref 0–55)
AST: 14 U/L (ref 5–34)
Anion Gap: 10 mEq/L (ref 3–11)
BILIRUBIN TOTAL: 0.47 mg/dL (ref 0.20–1.20)
BUN: 12.2 mg/dL (ref 7.0–26.0)
CALCIUM: 10.2 mg/dL (ref 8.4–10.4)
CO2: 24 mEq/L (ref 22–29)
CREATININE: 1.3 mg/dL — AB (ref 0.6–1.1)
Chloride: 107 mEq/L (ref 98–109)
EGFR: 46 mL/min/{1.73_m2} — ABNORMAL LOW (ref >90)
GLUCOSE: 86 mg/dL (ref 70–140)
Potassium: 3.9 mEq/L (ref 3.5–5.1)
Sodium: 141 mEq/L (ref 136–145)
TOTAL PROTEIN: 7.4 g/dL (ref 6.4–8.3)

## 2016-10-29 MED ORDER — SODIUM CHLORIDE 0.9 % IV SOLN
240.0000 mg | Freq: Once | INTRAVENOUS | Status: AC
  Administered 2016-10-29: 240 mg via INTRAVENOUS
  Filled 2016-10-29: qty 20

## 2016-10-29 MED ORDER — SODIUM CHLORIDE 0.9 % IV SOLN
Freq: Once | INTRAVENOUS | Status: AC
  Administered 2016-10-29: 14:00:00 via INTRAVENOUS

## 2016-10-29 MED ORDER — SODIUM CHLORIDE 0.9% FLUSH
10.0000 mL | INTRAVENOUS | Status: DC | PRN
  Administered 2016-10-29: 10 mL
  Filled 2016-10-29: qty 10

## 2016-10-29 MED ORDER — HEPARIN SOD (PORK) LOCK FLUSH 100 UNIT/ML IV SOLN
500.0000 [IU] | Freq: Once | INTRAVENOUS | Status: AC | PRN
  Administered 2016-10-29: 500 [IU]
  Filled 2016-10-29: qty 5

## 2016-10-29 NOTE — Progress Notes (Signed)
[place] [phone]   [phone]  OFFICE PROGRESS NOTE  [name], NP [phone] [address] [place] [address]  DIAGNOSIS:  1) recurrent non-small cell lung cancer, adenocarcinoma initially diagnosed as stage IB (T2a, N0, M0) in December 2010. 2) history of acute pulmonary embolism in the right lower lobe lobe are, segmental and subsegmental pulmonary arteries diagnosed in January 2013.  PRIOR THERAPY: 1.Status post right upper lobectomy on September 28, 2009 under the care of Dr. Arlyce Dice. The patient refused adjuvant chemotherapy at that time. She had disease recurrence in June of 2012.  2.Status post 4 cycles of systemic chemotherapy with carboplatin for an AUC of 6 and paclitaxel at 200 mg per meter squared and Avastin at 15 mg/kg according to the ECOG protocol #5508.  3. Chemotherapy with Avastin 15 mg/kg according to the ECOG protocol #5508 status post 32 cycles. Last cycle was given on 06/30/2013 discontinued secondary to disease progression and persistent proteinuria. 4. Tarceva 150 mg by mouth daily, started 08/09/2013, status post 10 months of treatment. 5. Tarceva 100 mg by mouth daily started 07/02/2014, status post 19 months, discontinued secondary to disease progression.  CURRENT THERAPY: Immunotherapy with Nivolumab 240 mg IV every 2 weeks status post 17 cycles. First dose was given 02/20/2016.  INTERVAL HISTORY: [name] 76 y.o. female returns to the clinic today for follow-up visit. The patient is currently on treatment with Nivolumab status post 17 cycles. She has been tolerating this treatment well with no concerning adverse effects. She denied having any skin rash or diarrhea. She has no nausea, vomiting, diarrhea or constipation. The patient has no significant weight loss or night sweats. She has no chest pain, shortness of breath, cough or hemoptysis. She still on nitrofurantoin for history of urinary tract infection.  She had repeat CT scan of the chest performed recently and she is here for evaluation and discussion of her scan results.  MEDICAL HISTORY: Past Medical History:  Diagnosis Date  . 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 Chronic fatigue 02/14/2016  . Encounter for antineoplastic immunotherapy 02/14/2016  . Foot pain 09/02/2011  . Glaucoma   . Hip pain 09/02/2011  . Hypercholesterolemia   . Hypertension   . Hypothyroidism   . Lung cancer ([place]) 03/08/2011   recurrent    ALLERGIES:  is allergic to cymbalta [duloxetine hcl]; fish allergy; amitriptyline; codeine; and sulfa antibiotics.  MEDICATIONS:  Current Outpatient Prescriptions  Medication Sig Dispense Refill  . amLODipine (NORVASC) 5 MG tablet Take 10 mg by mouth daily. Per [name], NP    . aspirin 81 MG tablet Take 81 mg by mouth daily.    . clonazePAM (KLONOPIN) 0.5 MG tablet Take 0.5 mg by mouth 2 (two) times daily as needed. Per [name], NP    . cyanocobalamin (,VITAMIN B-12,) 1000 MCG/ML injection Inject 1,000 mcg into the muscle every 30 (thirty) days.    . dorzolamide-timolol (COSOPT) 22.3-6.8 MG/ML ophthalmic solution Place 2 drops into both eyes 2 (two) times daily. Per [name], NP    . fluticasone (FLONASE) 50 MCG/ACT nasal spray Place 1 spray into both nostrils daily as needed for allergies or rhinitis.    Marland Kitchen levothyroxine (SYNTHROID, LEVOTHROID) 88 MCG tablet Take 88 mcg by mouth daily before breakfast.    . lidocaine-prilocaine (EMLA) cream Apply 1 application topically as needed (prior to port access). 30 g 0  . lisinopril (PRINIVIL,ZESTRIL) 40 MG tablet Take 40  mg by mouth daily. Per [name], NP     . MYRBETRIQ 50 MG TB24 tablet Take 50 mg by mouth daily. Reported on 04/17/2016  3  . nitrofurantoin (MACRODANTIN) 50 MG capsule Take 50 mg by mouth daily.  2   No current facility-administered medications for this visit.     SURGICAL HISTORY:  Past Surgical History:  Procedure  Laterality Date  . LUNG LOBECTOMY  09/28/2009   right upper  . THORACOTOMY  09/28/2009   mini  . VIDEO ASSISTED THORACOSCOPY  09/28/2009    REVIEW OF SYSTEMS:  Constitutional: negative Eyes: negative Ears, nose, mouth, throat, and face: negative Respiratory: negative Cardiovascular: negative Gastrointestinal: negative Genitourinary:negative Integument/breast: negative Hematologic/lymphatic: negative Musculoskeletal:negative Neurological: negative Behavioral/Psych: negative Endocrine: negative Allergic/Immunologic: negative   PHYSICAL EXAMINATION: General appearance: alert, cooperative and no distress Head: Normocephalic, without obvious abnormality, atraumatic Neck: no adenopathy, no JVD, supple, symmetrical, trachea midline and thyroid not enlarged, symmetric, no tenderness/mass/nodules Lymph nodes: Cervical, supraclavicular, and axillary nodes normal. Resp: clear to auscultation bilaterally Back: symmetric, no curvature. ROM normal. No CVA tenderness. Cardio: regular rate and rhythm, S1, S2 normal, no murmur, click, rub or gallop GI: soft, non-tender; bowel sounds normal; no masses,  no organomegaly Extremities: extremities normal, atraumatic, no cyanosis or edema Neurologic: Alert and oriented X 3, normal strength and tone. Normal symmetric reflexes. Normal coordination and gait  ECOG PERFORMANCE STATUS: 1 - Symptomatic but completely ambulatory  Blood pressure (!) 152/79, pulse 80, temperature 97.5 F (36.4 C), temperature source Oral, resp. rate 18, height '5\' 5"'$  (1.651 m), weight 139 lb 11.2 oz (63.4 kg), SpO2 100 %.  LABORATORY DATA: Lab Results  Component Value Date   WBC 8.3 10/29/2016   HGB 12.9 10/29/2016   HCT 39.3 10/29/2016   MCV 87.1 10/29/2016   PLT 205 10/29/2016      Chemistry      Component Value Date/Time   NA 140 10/15/2016 0818   K 4.1 10/15/2016 0818   CL 105 01/11/2015 1603   CL 107 03/30/2013 1036   CO2 23 10/15/2016 0818   BUN 14.1  10/15/2016 0818   CREATININE 1.3 (H) 10/15/2016 0818      Component Value Date/Time   CALCIUM 10.6 (H) 10/15/2016 0818   ALKPHOS 80 10/15/2016 0818   AST 12 10/15/2016 0818   ALT 13 10/15/2016 0818   BILITOT 0.41 10/15/2016 0818       RADIOGRAPHIC STUDIES: Ct Chest W Contrast  Result Date: 10/25/2016 CLINICAL DATA:  Lung carcinoma. Diagnosis 2009. Chemotherapy complete 2014. EXAM: CT CHEST WITH CONTRAST TECHNIQUE: Multidetector CT imaging of the chest was performed during intravenous contrast administration. CONTRAST:  83m ISOVUE-300 IOPAMIDOL (ISOVUE-300) INJECTION 61% COMPARISON:  CT 08/02/2016 FINDINGS: Cardiovascular: No axillary or supraclavicular adenopathy. No mediastinal hilar adenopathy. Mediastinum/Nodes: No axillary supraclavicular adenopathy. No mediastinal hilar adenopathy. Lungs/Pleura: Nodule described as new on comparison CT measures 7 mm (image 53, series 4) unchanged in the interval. Postsurgical change in the RIGHT upper lobe. Nodule in the superior segment of the RIGHT lower lobe measures 10 mm (image 56, series 4) not changed from 12 mm. Nodule in the medial RIGHT lung base measures 18 mm similar to 20 mm (image 84, series 4) Within LEFT lower lobe larger nodule measuring 24 x 19 mm (image 63, series 4) compares to 25 x 21 mm on prior. No new pulmonary nodules. Centrilobular emphysema in the upper lobes. Upper Abdomen: Limited view of the liver, kidneys, pancreas are unremarkable. Normal adrenal glands. Musculoskeletal:  No aggressive osseous lesion. IMPRESSION: 1. Stable bilateral pulmonary nodules. Largest nodule measures 2.3 cm in LEFT lower lobe. 2. No mediastinal adenopathy. 3. Coronary artery calcification and aortic atherosclerotic calcification. Electronically Signed   By: Suzy Bouchard M.D.   On: 10/25/2016 16:48    ASSESSMENT AND PLAN: This is a very pleasant 76 years old African-American female with recurrent non-small cell lung cancer status post several  chemotherapy regimens and she is currently on treatment with immunotherapy with Nivolumab status post 17 cycles. The patient is tolerating her treatment well. She had repeat CT scan of the chest performed recently. I personally and independently reviewed the scan images and discuss the results with the patient today. Her scan showed no evidence for disease progression. I recommended for the patient to continue her current treatment with Nivolumab and she will start cycle #18 today. She is tolerating her decision regarding coverage of her treatment from some of the [facility]. For hypertension, I strongly recommended for the patient to take her blood pressure medications as prescribed by her primary care physician including lisinopril and Norvasc. She will check her blood pressure at home regularly and consult with her primary care physician for adjustment of her medication. For the history of recurrent urinary tract infection she is currently on nitrofurantoin 50 mg by mouth daily. For hypothyroidism, the patient will continue on levothyroxine 88 g by mouth daily. The patient would come back for follow-up visit in 2 weeks for evaluation before starting cycle #19. She was advised to call immediately if she has any concerning symptoms in the interval. The patient voices understanding of current disease status and treatment options and is in agreement with the current care plan.  All questions were answered. The patient knows to call the clinic with any problems, questions or concerns. We can certainly see the patient much sooner if necessary.  Disclaimer: This note was dictated with voice recognition software. Similar sounding words can inadvertently be transcribed and may not be corrected upon review.

## 2016-10-29 NOTE — Patient Instructions (Signed)
[facility] Discharge Instructions for Patients Receiving Chemotherapy  Today you received the following chemotherapy agents:  Nivolumab.  To help prevent nausea and vomiting after your treatment, we encourage you to take your nausea medication as directed.   If you develop nausea and vomiting that is not controlled by your nausea medication, call the clinic.   BELOW ARE SYMPTOMS THAT SHOULD BE REPORTED IMMEDIATELY:  *FEVER GREATER THAN 100.5 F  *CHILLS WITH OR WITHOUT FEVER  NAUSEA AND VOMITING THAT IS NOT CONTROLLED WITH YOUR NAUSEA MEDICATION  *UNUSUAL SHORTNESS OF BREATH  *UNUSUAL BRUISING OR BLEEDING  TENDERNESS IN MOUTH AND THROAT WITH OR WITHOUT PRESENCE OF ULCERS  *URINARY PROBLEMS  *BOWEL PROBLEMS  UNUSUAL RASH Items with * indicate a potential emergency and should be followed up as soon as possible.  Feel free to call the clinic you have any questions or concerns. The clinic phone number is [phone].  Please show the CHEMO ALERT CARD at check-in to the Emergency Department and triage nurse.   

## 2016-11-07 ENCOUNTER — Encounter: Payer: Self-pay | Admitting: Internal Medicine

## 2016-11-07 NOTE — Progress Notes (Signed)
Rcvd letter from [name].  After appealing the denial, pt is approved to receive Opdivo free of charge from 11/05/16 - 10/13/17.

## 2016-11-12 ENCOUNTER — Other Ambulatory Visit (HOSPITAL_BASED_OUTPATIENT_CLINIC_OR_DEPARTMENT_OTHER): Payer: Medicare Other

## 2016-11-12 ENCOUNTER — Ambulatory Visit (HOSPITAL_BASED_OUTPATIENT_CLINIC_OR_DEPARTMENT_OTHER): Payer: Medicare Other

## 2016-11-12 ENCOUNTER — Encounter: Payer: Self-pay | Admitting: Internal Medicine

## 2016-11-12 ENCOUNTER — Ambulatory Visit (HOSPITAL_BASED_OUTPATIENT_CLINIC_OR_DEPARTMENT_OTHER): Payer: Medicare Other | Admitting: Internal Medicine

## 2016-11-12 VITALS — BP 138/61 | HR 69 | Temp 98.1°F | Resp 18 | Wt 140.3 lb

## 2016-11-12 DIAGNOSIS — C3492 Malignant neoplasm of unspecified part of left bronchus or lung: Secondary | ICD-10-CM | POA: Diagnosis not present

## 2016-11-12 DIAGNOSIS — C3491 Malignant neoplasm of unspecified part of right bronchus or lung: Secondary | ICD-10-CM

## 2016-11-12 DIAGNOSIS — Z5112 Encounter for antineoplastic immunotherapy: Secondary | ICD-10-CM

## 2016-11-12 LAB — COMPREHENSIVE METABOLIC PANEL
ALT: 15 U/L (ref 0–55)
ANION GAP: 10 meq/L (ref 3–11)
AST: 14 U/L (ref 5–34)
Albumin: 4.1 g/dL (ref 3.5–5.0)
Alkaline Phosphatase: 75 U/L (ref 40–150)
BUN: 15.8 mg/dL (ref 7.0–26.0)
CALCIUM: 10.5 mg/dL — AB (ref 8.4–10.4)
CHLORIDE: 107 meq/L (ref 98–109)
CO2: 24 meq/L (ref 22–29)
Creatinine: 1.2 mg/dL — ABNORMAL HIGH (ref 0.6–1.1)
EGFR: 50 mL/min/{1.73_m2} — AB (ref >90)
Glucose: 81 mg/dl (ref 70–140)
POTASSIUM: 4.1 meq/L (ref 3.5–5.1)
Sodium: 140 mEq/L (ref 136–145)
Total Bilirubin: 0.42 mg/dL (ref 0.20–1.20)
Total Protein: 7.6 g/dL (ref 6.4–8.3)

## 2016-11-12 LAB — CBC WITH DIFFERENTIAL/PLATELET
BASO%: 1.2 % (ref 0.0–2.0)
BASOS ABS: 0.1 10*3/uL (ref 0.0–0.1)
EOS%: 3.8 % (ref 0.0–7.0)
Eosinophils Absolute: 0.3 10*3/uL (ref 0.0–0.5)
HEMATOCRIT: 40.3 % (ref 34.8–46.6)
HGB: 13.3 g/dL (ref 11.6–15.9)
LYMPH#: 1.9 10*3/uL (ref 0.9–3.3)
LYMPH%: 22.6 % (ref 14.0–49.7)
MCH: 29.3 pg (ref 25.1–34.0)
MCHC: 33 g/dL (ref 31.5–36.0)
MCV: 88.6 fL (ref 79.5–101.0)
MONO#: 0.7 10*3/uL (ref 0.1–0.9)
MONO%: 8 % (ref 0.0–14.0)
NEUT#: 5.3 10*3/uL (ref 1.5–6.5)
NEUT%: 64.4 % (ref 38.4–76.8)
PLATELETS: 202 10*3/uL (ref 145–400)
RBC: 4.55 10*6/uL (ref 3.70–5.45)
RDW: 14.5 % (ref 11.2–14.5)
WBC: 8.2 10*3/uL (ref 3.9–10.3)

## 2016-11-12 MED ORDER — SODIUM CHLORIDE 0.9% FLUSH
10.0000 mL | INTRAVENOUS | Status: DC | PRN
  Administered 2016-11-12: 10 mL
  Filled 2016-11-12: qty 10

## 2016-11-12 MED ORDER — SODIUM CHLORIDE 0.9 % IV SOLN
Freq: Once | INTRAVENOUS | Status: AC
  Administered 2016-11-12: 13:00:00 via INTRAVENOUS

## 2016-11-12 MED ORDER — SODIUM CHLORIDE 0.9 % IV SOLN
240.0000 mg | Freq: Once | INTRAVENOUS | Status: AC
  Administered 2016-11-12: 240 mg via INTRAVENOUS
  Filled 2016-11-12: qty 20

## 2016-11-12 MED ORDER — HEPARIN SOD (PORK) LOCK FLUSH 100 UNIT/ML IV SOLN
500.0000 [IU] | Freq: Once | INTRAVENOUS | Status: AC | PRN
  Administered 2016-11-12: 500 [IU]
  Filled 2016-11-12: qty 5

## 2016-11-12 NOTE — Patient Instructions (Signed)
[facility] Discharge Instructions for Patients Receiving Chemotherapy  Today you received the following chemotherapy agents:  Nivolumab.  To help prevent nausea and vomiting after your treatment, we encourage you to take your nausea medication as directed.   If you develop nausea and vomiting that is not controlled by your nausea medication, call the clinic.   BELOW ARE SYMPTOMS THAT SHOULD BE REPORTED IMMEDIATELY:  *FEVER GREATER THAN 100.5 F  *CHILLS WITH OR WITHOUT FEVER  NAUSEA AND VOMITING THAT IS NOT CONTROLLED WITH YOUR NAUSEA MEDICATION  *UNUSUAL SHORTNESS OF BREATH  *UNUSUAL BRUISING OR BLEEDING  TENDERNESS IN MOUTH AND THROAT WITH OR WITHOUT PRESENCE OF ULCERS  *URINARY PROBLEMS  *BOWEL PROBLEMS  UNUSUAL RASH Items with * indicate a potential emergency and should be followed up as soon as possible.  Feel free to call the clinic you have any questions or concerns. The clinic phone number is [phone].  Please show the CHEMO ALERT CARD at check-in to the Emergency Department and triage nurse.   

## 2016-11-12 NOTE — Progress Notes (Signed)
[place] [phone]   Fax:(336) [phone]  OFFICE PROGRESS NOTE  Terri Levels, [name] [phone] [address] [place] [address]  DIAGNOSIS:  1) recurrent non-small cell lung cancer, adenocarcinoma initially diagnosed as stage IB (T2a, N0, M0) in December 2010. 2) history of acute pulmonary embolism in the right lower lobe lobe are, segmental and subsegmental pulmonary arteries diagnosed in January 2013.  PRIOR THERAPY: 1.Status post right upper lobectomy on September 28, 2009 under the care of Dr. Arlyce Dice. The patient refused adjuvant chemotherapy at that time. She had disease recurrence in June of 2012.  2.Status post 4 cycles of systemic chemotherapy with carboplatin for an AUC of 6 and paclitaxel at 200 mg per meter squared and Avastin at 15 mg/kg according to the ECOG protocol #5508.  3. Chemotherapy with Avastin 15 mg/kg according to the ECOG protocol #5508 status post 32 cycles. Last cycle was given on 06/30/2013 discontinued secondary to disease progression and persistent proteinuria. 4. Tarceva 150 mg by mouth daily, started 08/09/2013, status post 10 months of treatment. 5. Tarceva 100 mg by mouth daily started 07/02/2014, status post 19 months, discontinued secondary to disease progression.  CURRENT THERAPY: Immunotherapy with Nivolumab 240 mg IV every 2 weeks status post 18 cycles. First dose was given 02/20/2016.  INTERVAL HISTORY: [name] 76 y.o. female came to the clinic today for follow-up visit. The patient is currently on treatment with immunotherapy with Nivolumab status post 18 cycles. She is tolerating her treatment well. Fortunately she got extended approval for her treatment with Nivolumab. She denied having any chest pain, shortness of breath, cough or hemoptysis. She has no fever or chills. She denied having any significant weight loss or night sweats. She is here today for evaluation and repeat blood work before starting cycle  #19.  MEDICAL HISTORY: Past Medical History:  Diagnosis Date  . Bilateral lung cancer ([facility]) 03/08/2011   recurrent  . Chronic fatigue 02/14/2016  . Encounter for antineoplastic immunotherapy 02/14/2016  . Foot pain 09/02/2011  . Glaucoma   . Hip pain 09/02/2011  . Hypercholesterolemia   . Hypertension   . Hypertension 10/29/2016  . Hypothyroidism   . Lung cancer ([facility]) 03/08/2011   recurrent    ALLERGIES:  is allergic to cymbalta [duloxetine hcl]; fish allergy; amitriptyline; codeine; and sulfa antibiotics.  MEDICATIONS:  Current Outpatient Prescriptions  Medication Sig Dispense Refill  . amLODipine (NORVASC) 5 MG tablet Take 10 mg by mouth daily. Per [name], [name]    . aspirin 81 MG tablet Take 81 mg by mouth daily.    . clonazePAM (KLONOPIN) 0.5 MG tablet Take 0.5 mg by mouth 2 (two) times daily as needed. Per [name], [name]    . cyanocobalamin (,VITAMIN B-12,) 1000 MCG/ML injection Inject 1,000 mcg into the muscle every 30 (thirty) days.    . dorzolamide-timolol (COSOPT) 22.3-6.8 MG/ML ophthalmic solution Place 2 drops into both eyes 2 (two) times daily. Per [name], [name]    . fluticasone (FLONASE) 50 MCG/ACT nasal spray Place 1 spray into both nostrils daily as needed for allergies or rhinitis.    Marland Kitchen levothyroxine (SYNTHROID, LEVOTHROID) 88 MCG tablet Take 88 mcg by mouth daily before breakfast.    . lidocaine-prilocaine (EMLA) cream Apply 1 application topically as needed (prior to port access). 30 g 0  . lisinopril (PRINIVIL,ZESTRIL) 40 MG tablet Take 40 mg by mouth daily. Per [name], [name]     . nitrofurantoin (MACRODANTIN) 50 MG  capsule Take 50 mg by mouth daily.  2  . MYRBETRIQ 50 MG TB24 tablet Take 50 mg by mouth daily. Reported on 04/17/2016  3   No current facility-administered medications for this visit.     SURGICAL HISTORY:  Past Surgical History:  Procedure Laterality Date  . LUNG LOBECTOMY  09/28/2009   right upper  . THORACOTOMY  09/28/2009   mini   . VIDEO ASSISTED THORACOSCOPY  09/28/2009    REVIEW OF SYSTEMS:  A comprehensive review of systems was negative.   PHYSICAL EXAMINATION: General appearance: alert, cooperative and no distress Head: Normocephalic, without obvious abnormality, atraumatic Neck: no adenopathy, no JVD, supple, symmetrical, trachea midline and thyroid not enlarged, symmetric, no tenderness/mass/nodules Lymph nodes: Cervical, supraclavicular, and axillary nodes normal. Resp: clear to auscultation bilaterally Back: symmetric, no curvature. ROM normal. No CVA tenderness. Cardio: regular rate and rhythm, S1, S2 normal, no murmur, click, rub or gallop GI: soft, non-tender; bowel sounds normal; no masses,  no organomegaly Extremities: extremities normal, atraumatic, no cyanosis or edema  ECOG PERFORMANCE STATUS: 1 - Symptomatic but completely ambulatory  Blood pressure 138/61, pulse 69, temperature 98.1 F (36.7 C), temperature source Oral, resp. rate 18, weight 140 lb 5 oz (63.6 kg).  LABORATORY DATA: Lab Results  Component Value Date   WBC 8.2 11/12/2016   HGB 13.3 11/12/2016   HCT 40.3 11/12/2016   MCV 88.6 11/12/2016   PLT 202 11/12/2016      Chemistry      Component Value Date/Time   NA 140 11/12/2016 1039   K 4.1 11/12/2016 1039   CL 105 01/11/2015 1603   CL 107 03/30/2013 1036   CO2 24 11/12/2016 1039   BUN 15.8 11/12/2016 1039   CREATININE 1.2 (H) 11/12/2016 1039      Component Value Date/Time   CALCIUM 10.5 (H) 11/12/2016 1039   ALKPHOS 75 11/12/2016 1039   AST 14 11/12/2016 1039   ALT 15 11/12/2016 1039   BILITOT 0.42 11/12/2016 1039       RADIOGRAPHIC STUDIES: Ct Chest W Contrast  Result Date: 10/25/2016 CLINICAL DATA:  Lung carcinoma. Diagnosis 2009. Chemotherapy complete 2014. EXAM: CT CHEST WITH CONTRAST TECHNIQUE: Multidetector CT imaging of the chest was performed during intravenous contrast administration. CONTRAST:  69m ISOVUE-300 IOPAMIDOL (ISOVUE-300) INJECTION 61%  COMPARISON:  CT 08/02/2016 FINDINGS: Cardiovascular: No axillary or supraclavicular adenopathy. No mediastinal hilar adenopathy. Mediastinum/Nodes: No axillary supraclavicular adenopathy. No mediastinal hilar adenopathy. Lungs/Pleura: Nodule described as new on comparison CT measures 7 mm (image 53, series 4) unchanged in the interval. Postsurgical change in the RIGHT upper lobe. Nodule in the superior segment of the RIGHT lower lobe measures 10 mm (image 56, series 4) not changed from 12 mm. Nodule in the medial RIGHT lung base measures 18 mm similar to 20 mm (image 84, series 4) Within LEFT lower lobe larger nodule measuring 24 x 19 mm (image 63, series 4) compares to 25 x 21 mm on prior. No new pulmonary nodules. Centrilobular emphysema in the upper lobes. Upper Abdomen: Limited view of the liver, kidneys, pancreas are unremarkable. Normal adrenal glands. Musculoskeletal: No aggressive osseous lesion. IMPRESSION: 1. Stable bilateral pulmonary nodules. Largest nodule measures 2.3 cm in LEFT lower lobe. 2. No mediastinal adenopathy. 3. Coronary artery calcification and aortic atherosclerotic calcification. Electronically Signed   By: SSuzy BouchardM.D.   On: 10/25/2016 16:48    ASSESSMENT AND PLAN:  This is a very pleasant 76years old African-American female with recurrent non-small cell lung  cancer, adenocarcinoma. She is status post several chemotherapy regimen. She is currently on treatment with immunotherapy with Nivolumab status post 18 cycles. The patient is tolerating her treatment well with no significant adverse effects. I recommended for her to proceed with cycle #19 today as a scheduled. I will see her back for follow-up visit in 2 weeks for evaluation before starting the next dose of her treatment. She was advised to immediately if she has any concerning symptoms in the interval. The patient voices understanding of current disease status and treatment options and is in agreement with the  current care plan.  All questions were answered. The patient knows to call the clinic with any problems, questions or concerns. We can certainly see the patient much sooner if necessary. I spent 10 minutes counseling the patient face to face. The total time spent in the appointment was 15 minutes.  Disclaimer: This note was dictated with voice recognition software. Similar sounding words can inadvertently be transcribed and may not be corrected upon review.

## 2016-11-26 ENCOUNTER — Ambulatory Visit (HOSPITAL_BASED_OUTPATIENT_CLINIC_OR_DEPARTMENT_OTHER): Payer: Medicare Other

## 2016-11-26 ENCOUNTER — Other Ambulatory Visit (HOSPITAL_BASED_OUTPATIENT_CLINIC_OR_DEPARTMENT_OTHER): Payer: Medicare Other

## 2016-11-26 ENCOUNTER — Ambulatory Visit (HOSPITAL_BASED_OUTPATIENT_CLINIC_OR_DEPARTMENT_OTHER): Payer: Medicare Other | Admitting: Internal Medicine

## 2016-11-26 ENCOUNTER — Encounter: Payer: Self-pay | Admitting: Internal Medicine

## 2016-11-26 VITALS — BP 154/100 | HR 82 | Temp 97.6°F | Resp 18 | Ht 65.0 in | Wt 140.6 lb

## 2016-11-26 VITALS — BP 114/62 | HR 66

## 2016-11-26 DIAGNOSIS — C3491 Malignant neoplasm of unspecified part of right bronchus or lung: Secondary | ICD-10-CM | POA: Diagnosis not present

## 2016-11-26 DIAGNOSIS — C3492 Malignant neoplasm of unspecified part of left bronchus or lung: Secondary | ICD-10-CM

## 2016-11-26 DIAGNOSIS — Z5112 Encounter for antineoplastic immunotherapy: Secondary | ICD-10-CM

## 2016-11-26 DIAGNOSIS — R200 Anesthesia of skin: Secondary | ICD-10-CM

## 2016-11-26 DIAGNOSIS — E538 Deficiency of other specified B group vitamins: Secondary | ICD-10-CM

## 2016-11-26 DIAGNOSIS — I10 Essential (primary) hypertension: Secondary | ICD-10-CM

## 2016-11-26 LAB — CBC WITH DIFFERENTIAL/PLATELET
BASO%: 1.1 % (ref 0.0–2.0)
BASOS ABS: 0.1 10*3/uL (ref 0.0–0.1)
EOS%: 3.6 % (ref 0.0–7.0)
Eosinophils Absolute: 0.3 10*3/uL (ref 0.0–0.5)
HCT: 39.2 % (ref 34.8–46.6)
HEMOGLOBIN: 12.9 g/dL (ref 11.6–15.9)
LYMPH%: 24.3 % (ref 14.0–49.7)
MCH: 29.1 pg (ref 25.1–34.0)
MCHC: 32.9 g/dL (ref 31.5–36.0)
MCV: 88.4 fL (ref 79.5–101.0)
MONO#: 0.6 10*3/uL (ref 0.1–0.9)
MONO%: 6.8 % (ref 0.0–14.0)
NEUT#: 5.5 10*3/uL (ref 1.5–6.5)
NEUT%: 64.2 % (ref 38.4–76.8)
Platelets: 237 10*3/uL (ref 145–400)
RBC: 4.43 10*6/uL (ref 3.70–5.45)
RDW: 14.1 % (ref 11.2–14.5)
WBC: 8.5 10*3/uL (ref 3.9–10.3)
lymph#: 2.1 10*3/uL (ref 0.9–3.3)

## 2016-11-26 LAB — COMPREHENSIVE METABOLIC PANEL
ALT: 15 U/L (ref 0–55)
AST: 12 U/L (ref 5–34)
Albumin: 4.1 g/dL (ref 3.5–5.0)
Alkaline Phosphatase: 79 U/L (ref 40–150)
Anion Gap: 11 mEq/L (ref 3–11)
BUN: 13.8 mg/dL (ref 7.0–26.0)
CHLORIDE: 107 meq/L (ref 98–109)
CO2: 22 meq/L (ref 22–29)
Calcium: 10.5 mg/dL — ABNORMAL HIGH (ref 8.4–10.4)
Creatinine: 1.2 mg/dL — ABNORMAL HIGH (ref 0.6–1.1)
EGFR: 52 mL/min/{1.73_m2} — ABNORMAL LOW (ref >90)
GLUCOSE: 82 mg/dL (ref 70–140)
POTASSIUM: 4 meq/L (ref 3.5–5.1)
SODIUM: 140 meq/L (ref 136–145)
Total Bilirubin: 0.44 mg/dL (ref 0.20–1.20)
Total Protein: 7.6 g/dL (ref 6.4–8.3)

## 2016-11-26 MED ORDER — CYANOCOBALAMIN 1000 MCG/ML IJ SOLN
1000.0000 ug | Freq: Once | INTRAMUSCULAR | Status: AC
  Administered 2016-11-26: 1000 ug via INTRAMUSCULAR

## 2016-11-26 MED ORDER — SODIUM CHLORIDE 0.9 % IV SOLN
Freq: Once | INTRAVENOUS | Status: AC
  Administered 2016-11-26: 14:00:00 via INTRAVENOUS

## 2016-11-26 MED ORDER — HEPARIN SOD (PORK) LOCK FLUSH 100 UNIT/ML IV SOLN
500.0000 [IU] | Freq: Once | INTRAVENOUS | Status: AC | PRN
  Administered 2016-11-26: 500 [IU]
  Filled 2016-11-26: qty 5

## 2016-11-26 MED ORDER — SODIUM CHLORIDE 0.9% FLUSH
10.0000 mL | INTRAVENOUS | Status: DC | PRN
  Administered 2016-11-26: 10 mL
  Filled 2016-11-26: qty 10

## 2016-11-26 MED ORDER — SODIUM CHLORIDE 0.9 % IV SOLN
240.0000 mg | Freq: Once | INTRAVENOUS | Status: AC
  Administered 2016-11-26: 240 mg via INTRAVENOUS
  Filled 2016-11-26: qty 20

## 2016-11-26 MED ORDER — CYANOCOBALAMIN 1000 MCG/ML IJ SOLN
INTRAMUSCULAR | Status: AC
  Filled 2016-11-26: qty 1

## 2016-11-26 NOTE — Patient Instructions (Signed)
Cancer Center Discharge Instructions for Patients Receiving Chemotherapy  Today you received the following chemotherapy agents Nivolumab.  To help prevent nausea and vomiting after your treatment, we encourage you to take your nausea medication as prescribed.   If you develop nausea and vomiting that is not controlled by your nausea medication, call the clinic.   BELOW ARE SYMPTOMS THAT SHOULD BE REPORTED IMMEDIATELY:  *FEVER GREATER THAN 100.5 F  *CHILLS WITH OR WITHOUT FEVER  NAUSEA AND VOMITING THAT IS NOT CONTROLLED WITH YOUR NAUSEA MEDICATION  *UNUSUAL SHORTNESS OF BREATH  *UNUSUAL BRUISING OR BLEEDING  TENDERNESS IN MOUTH AND THROAT WITH OR WITHOUT PRESENCE OF ULCERS  *URINARY PROBLEMS  *BOWEL PROBLEMS  UNUSUAL RASH Items with * indicate a potential emergency and should be followed up as soon as possible.  Feel free to call the clinic you have any questions or concerns. The clinic phone number is [phone].  Please show the CHEMO ALERT CARD at check-in to the Emergency Department and triage nurse.   

## 2016-11-26 NOTE — Progress Notes (Signed)
[facility] [phone]   Fax:(336) [phone]  OFFICE PROGRESS NOTE  Terri Levels, NP [phone] [address] [place] [address]  DIAGNOSIS:  1) recurrent non-small cell lung cancer, adenocarcinoma initially diagnosed as stage IB (T2a, N0, M0) in December 2010. 2) history of acute pulmonary embolism in the right lower lobe lobe are, segmental and subsegmental pulmonary arteries diagnosed in January 2013.  PRIOR THERAPY: 1.Status post right upper lobectomy on September 28, 2009 under the care of Dr. Arlyce Dice. The patient refused adjuvant chemotherapy at that time. She had disease recurrence in June of 2012.  2.Status post 4 cycles of systemic chemotherapy with carboplatin for an AUC of 6 and paclitaxel at 200 mg per meter squared and Avastin at 15 mg/kg according to the ECOG protocol #5508.  3. Chemotherapy with Avastin 15 mg/kg according to the ECOG protocol #5508 status post 32 cycles. Last cycle was given on 06/30/2013 discontinued secondary to disease progression and persistent proteinuria. 4. Tarceva 150 mg by mouth daily, started 08/09/2013, status post 10 months of treatment. 5. Tarceva 100 mg by mouth daily started 07/02/2014, status post 19 months, discontinued secondary to disease progression.  CURRENT THERAPY: Immunotherapy with Nivolumab 240 mg IV every 2 weeks status post 19 cycles. First dose was given 02/20/2016.  INTERVAL HISTORY: [name] 76 y.o. female returns to the clinic today for follow-up visit. The patient is tolerating her current treatment with immunotherapy with Nivolumab fairly well. She denied having any chest pain but has shortness of breath with exertion. She denied having any cough or hemoptysis. She has no nausea, vomiting, diarrhea or constipation. She denied having any significant weight loss or night sweats. She has no fever or chills. She is here for evaluation before starting cycle #20.  MEDICAL HISTORY: Past Medical  History:  Diagnosis Date  . Bilateral lung cancer ([facility]) 03/08/2011   recurrent  . Chronic fatigue 02/14/2016  . Encounter for antineoplastic immunotherapy 02/14/2016  . Foot pain 09/02/2011  . Glaucoma   . Hip pain 09/02/2011  . Hypercholesterolemia   . Hypertension   . Hypertension 10/29/2016  . Hypothyroidism   . Lung cancer ([facility]) 03/08/2011   recurrent    ALLERGIES:  is allergic to cymbalta [duloxetine hcl]; fish allergy; amitriptyline; codeine; and sulfa antibiotics.  MEDICATIONS:  Current Outpatient Prescriptions  Medication Sig Dispense Refill  . amLODipine (NORVASC) 5 MG tablet Take 10 mg by mouth daily. Per [name], NP    . aspirin 81 MG tablet Take 81 mg by mouth daily.    . clonazePAM (KLONOPIN) 0.5 MG tablet Take 0.5 mg by mouth 2 (two) times daily as needed. Per [name], NP    . cyanocobalamin (,VITAMIN B-12,) 1000 MCG/ML injection Inject 1,000 mcg into the muscle every 30 (thirty) days.    . dorzolamide-timolol (COSOPT) 22.3-6.8 MG/ML ophthalmic solution Place 2 drops into both eyes 2 (two) times daily. Per [name], NP    . fluticasone (FLONASE) 50 MCG/ACT nasal spray Place 1 spray into both nostrils daily as needed for allergies or rhinitis.    Marland Kitchen levothyroxine (SYNTHROID, LEVOTHROID) 88 MCG tablet Take 88 mcg by mouth daily before breakfast.    . lidocaine-prilocaine (EMLA) cream Apply 1 application topically as needed (prior to port access). 30 g 0  . lisinopril (PRINIVIL,ZESTRIL) 40 MG tablet Take 40 mg by mouth daily. Per [name], NP     . MYRBETRIQ 50 MG TB24 tablet Take 50 mg by mouth  daily. Reported on 04/17/2016  3  . nitrofurantoin (MACRODANTIN) 50 MG capsule Take 50 mg by mouth daily.  2   No current facility-administered medications for this visit.     SURGICAL HISTORY:  Past Surgical History:  Procedure Laterality Date  . LUNG LOBECTOMY  09/28/2009   right upper  . THORACOTOMY  09/28/2009   mini  . VIDEO ASSISTED THORACOSCOPY   09/28/2009    REVIEW OF SYSTEMS:  A comprehensive review of systems was negative except for: Respiratory: positive for dyspnea on exertion   PHYSICAL EXAMINATION: General appearance: alert, cooperative and no distress Head: Normocephalic, without obvious abnormality, atraumatic Neck: no adenopathy, no JVD, supple, symmetrical, trachea midline and thyroid not enlarged, symmetric, no tenderness/mass/nodules Lymph nodes: Cervical, supraclavicular, and axillary nodes normal. Resp: clear to auscultation bilaterally Back: symmetric, no curvature. ROM normal. No CVA tenderness. Cardio: regular rate and rhythm, S1, S2 normal, no murmur, click, rub or gallop GI: soft, non-tender; bowel sounds normal; no masses,  no organomegaly Extremities: extremities normal, atraumatic, no cyanosis or edema  ECOG PERFORMANCE STATUS: 1 - Symptomatic but completely ambulatory  Blood pressure (!) 154/100, pulse 82, temperature 97.6 F (36.4 C), temperature source Oral, resp. rate 18, height '5\' 5"'$  (1.651 m), weight 140 lb 9.6 oz (63.8 kg), SpO2 99 %.  LABORATORY DATA: Lab Results  Component Value Date   WBC 8.5 11/26/2016   HGB 12.9 11/26/2016   HCT 39.2 11/26/2016   MCV 88.4 11/26/2016   PLT 237 11/26/2016      Chemistry      Component Value Date/Time   NA 140 11/26/2016 1048   K 4.0 11/26/2016 1048   CL 105 01/11/2015 1603   CL 107 03/30/2013 1036   CO2 22 11/26/2016 1048   BUN 13.8 11/26/2016 1048   CREATININE 1.2 (H) 11/26/2016 1048      Component Value Date/Time   CALCIUM 10.5 (H) 11/26/2016 1048   ALKPHOS 79 11/26/2016 1048   AST 12 11/26/2016 1048   ALT 15 11/26/2016 1048   BILITOT 0.44 11/26/2016 1048       RADIOGRAPHIC STUDIES: No results found.  ASSESSMENT AND PLAN:  This is a very pleasant 76 years old African-American female with recurrent non-small cell lung cancer, adenocarcinoma. She is currently undergoing treatment with immunotherapy with Nivolumab status post 19  cycles. She tolerated the last cycle of her treatment well with no significant adverse effects. I recommended for her to proceed with cycle #20 today as a scheduled. For hypertension she will continue with her current treatment with Norvasc and lisinopril as prescribed by her primary care physician. She was advised to monitor her blood pressure closely at home and to report to his primary care physician if continues to be elevated. The patient was advised to call immediately if she has any concerning symptoms in the interval. The patient voices understanding of current disease status and treatment options and is in agreement with the current care plan.  All questions were answered. The patient knows to call the clinic with any problems, questions or concerns. We can certainly see the patient much sooner if necessary. I spent 10 minutes counseling the patient face to face. The total time spent in the appointment was 15 minutes.  Disclaimer: This note was dictated with voice recognition software. Similar sounding words can inadvertently be transcribed and may not be corrected upon review.

## 2016-12-10 ENCOUNTER — Other Ambulatory Visit (HOSPITAL_BASED_OUTPATIENT_CLINIC_OR_DEPARTMENT_OTHER): Payer: Medicare Other

## 2016-12-10 ENCOUNTER — Encounter: Payer: Self-pay | Admitting: Internal Medicine

## 2016-12-10 ENCOUNTER — Ambulatory Visit (HOSPITAL_BASED_OUTPATIENT_CLINIC_OR_DEPARTMENT_OTHER): Payer: Medicare Other

## 2016-12-10 ENCOUNTER — Ambulatory Visit (HOSPITAL_BASED_OUTPATIENT_CLINIC_OR_DEPARTMENT_OTHER): Payer: Medicare Other | Admitting: Internal Medicine

## 2016-12-10 ENCOUNTER — Telehealth: Payer: Self-pay | Admitting: Internal Medicine

## 2016-12-10 VITALS — BP 160/74 | HR 70 | Temp 97.6°F | Resp 18 | Ht 65.0 in | Wt 139.8 lb

## 2016-12-10 DIAGNOSIS — C3492 Malignant neoplasm of unspecified part of left bronchus or lung: Secondary | ICD-10-CM | POA: Diagnosis not present

## 2016-12-10 DIAGNOSIS — C3491 Malignant neoplasm of unspecified part of right bronchus or lung: Secondary | ICD-10-CM

## 2016-12-10 DIAGNOSIS — Z5112 Encounter for antineoplastic immunotherapy: Secondary | ICD-10-CM

## 2016-12-10 LAB — CBC WITH DIFFERENTIAL/PLATELET
BASO%: 1.1 % (ref 0.0–2.0)
Basophils Absolute: 0.1 10*3/uL (ref 0.0–0.1)
EOS ABS: 0.3 10*3/uL (ref 0.0–0.5)
EOS%: 4.1 % (ref 0.0–7.0)
HCT: 39.4 % (ref 34.8–46.6)
HEMOGLOBIN: 13.1 g/dL (ref 11.6–15.9)
LYMPH%: 25.2 % (ref 14.0–49.7)
MCH: 29.2 pg (ref 25.1–34.0)
MCHC: 33.4 g/dL (ref 31.5–36.0)
MCV: 87.5 fL (ref 79.5–101.0)
MONO#: 0.6 10*3/uL (ref 0.1–0.9)
MONO%: 7.1 % (ref 0.0–14.0)
NEUT#: 5.1 10*3/uL (ref 1.5–6.5)
NEUT%: 62.5 % (ref 38.4–76.8)
PLATELETS: 218 10*3/uL (ref 145–400)
RBC: 4.5 10*6/uL (ref 3.70–5.45)
RDW: 14.1 % (ref 11.2–14.5)
WBC: 8.1 10*3/uL (ref 3.9–10.3)
lymph#: 2 10*3/uL (ref 0.9–3.3)

## 2016-12-10 LAB — COMPREHENSIVE METABOLIC PANEL
ALBUMIN: 4.1 g/dL (ref 3.5–5.0)
ALK PHOS: 80 U/L (ref 40–150)
ALT: 13 U/L (ref 0–55)
ANION GAP: 7 meq/L (ref 3–11)
AST: 11 U/L (ref 5–34)
BILIRUBIN TOTAL: 0.42 mg/dL (ref 0.20–1.20)
BUN: 16.1 mg/dL (ref 7.0–26.0)
CO2: 26 mEq/L (ref 22–29)
Calcium: 10.6 mg/dL — ABNORMAL HIGH (ref 8.4–10.4)
Chloride: 106 mEq/L (ref 98–109)
Creatinine: 1.2 mg/dL — ABNORMAL HIGH (ref 0.6–1.1)
EGFR: 49 mL/min/{1.73_m2} — AB (ref >90)
GLUCOSE: 87 mg/dL (ref 70–140)
Potassium: 4.4 mEq/L (ref 3.5–5.1)
SODIUM: 140 meq/L (ref 136–145)
TOTAL PROTEIN: 7.6 g/dL (ref 6.4–8.3)

## 2016-12-10 MED ORDER — SODIUM CHLORIDE 0.9 % IV SOLN
Freq: Once | INTRAVENOUS | Status: AC
  Administered 2016-12-10: 13:00:00 via INTRAVENOUS

## 2016-12-10 MED ORDER — HEPARIN SOD (PORK) LOCK FLUSH 100 UNIT/ML IV SOLN
500.0000 [IU] | Freq: Once | INTRAVENOUS | Status: AC | PRN
  Administered 2016-12-10: 500 [IU]
  Filled 2016-12-10: qty 5

## 2016-12-10 MED ORDER — NIVOLUMAB CHEMO INJECTION 100 MG/10ML
240.0000 mg | Freq: Once | INTRAVENOUS | Status: AC
  Administered 2016-12-10: 240 mg via INTRAVENOUS
  Filled 2016-12-10: qty 20

## 2016-12-10 MED ORDER — SODIUM CHLORIDE 0.9% FLUSH
10.0000 mL | INTRAVENOUS | Status: DC | PRN
  Administered 2016-12-10: 10 mL
  Filled 2016-12-10: qty 10

## 2016-12-10 NOTE — Progress Notes (Signed)
[facility] [phone]   Fax:(336) [phone]  OFFICE PROGRESS NOTE  Terri Levels, NP [phone] [place] [address]  DIAGNOSIS:  1) recurrent non-small cell lung cancer, adenocarcinoma initially diagnosed as stage IB (T2a, N0, M0) in December 2010. 2) history of acute pulmonary embolism in the right lower lobe lobe are, segmental and subsegmental pulmonary arteries diagnosed in January 2013.  PRIOR THERAPY: 1.Status post right upper lobectomy on September 28, 2009 under the care of Dr. Arlyce Dice. The patient refused adjuvant chemotherapy at that time. She had disease recurrence in June of 2012.  2.Status post 4 cycles of systemic chemotherapy with carboplatin for an AUC of 6 and paclitaxel at 200 mg per meter squared and Avastin at 15 mg/kg according to the ECOG protocol #5508.  3. Chemotherapy with Avastin 15 mg/kg according to the ECOG protocol #5508 status post 32 cycles. Last cycle was given on 06/30/2013 discontinued secondary to disease progression and persistent proteinuria. 4. Tarceva 150 mg by mouth daily, started 08/09/2013, status post 10 months of treatment. 5. Tarceva 100 mg by mouth daily started 07/02/2014, status post 19 months, discontinued secondary to disease progression.  CURRENT THERAPY: Immunotherapy with Nivolumab 240 mg IV every 2 weeks status post 20 cycles. First dose was given 02/20/2016.  INTERVAL HISTORY: [name] 76 y.o. female came to the clinic today for follow-up visit. The patient is currently on immunotherapy with Nivolumab status post 20 cycles and has been tolerating her treatment well. She denied having any chest pain, shortness breath, cough or hemoptysis. She has no fever or chills. She denied having any weight loss or night sweats. She had gastroenteritis a week ago with 2 days of diarrhea that resolved spontaneously. She is here today for evaluation before starting cycle #21 of her treatment.  MEDICAL  HISTORY: Past Medical History:  Diagnosis Date  . Bilateral lung cancer ([facility]) 03/08/2011   recurrent  . Chronic fatigue 02/14/2016  . Encounter for antineoplastic immunotherapy 02/14/2016  . Foot pain 09/02/2011  . Glaucoma   . Hip pain 09/02/2011  . Hypercholesterolemia   . Hypertension   . Hypertension 10/29/2016  . Hypothyroidism   . Lung cancer ([facility]) 03/08/2011   recurrent    ALLERGIES:  is allergic to cymbalta [duloxetine hcl]; fish allergy; amitriptyline; codeine; and sulfa antibiotics.  MEDICATIONS:  Current Outpatient Prescriptions  Medication Sig Dispense Refill  . amLODipine (NORVASC) 5 MG tablet Take 10 mg by mouth daily. Per Terri Bras, NP    . aspirin 81 MG tablet Take 81 mg by mouth daily.    . clonazePAM (KLONOPIN) 0.5 MG tablet Take 0.5 mg by mouth 2 (two) times daily as needed. Per Terri Bras, NP    . cyanocobalamin (,VITAMIN B-12,) 1000 MCG/ML injection Inject 1,000 mcg into the muscle every 30 (thirty) days.    . dorzolamide-timolol (COSOPT) 22.3-6.8 MG/ML ophthalmic solution Place 2 drops into both eyes 2 (two) times daily. Per Terri Bras, NP    . fluticasone (FLONASE) 50 MCG/ACT nasal spray Place 1 spray into both nostrils daily as needed for allergies or rhinitis.    Marland Kitchen levothyroxine (SYNTHROID, LEVOTHROID) 88 MCG tablet Take 88 mcg by mouth daily before breakfast.    . lidocaine-prilocaine (EMLA) cream Apply 1 application topically as needed (prior to port access). 30 g 0  . lisinopril (PRINIVIL,ZESTRIL) 40 MG tablet Take 40 mg by mouth daily. Per Terri Bras, NP     . MYRBETRIQ 50 MG TB24  tablet Take 50 mg by mouth daily. Reported on 04/17/2016  3  . nitrofurantoin (MACRODANTIN) 50 MG capsule Take 50 mg by mouth daily.  2   No current facility-administered medications for this visit.     SURGICAL HISTORY:  Past Surgical History:  Procedure Laterality Date  . LUNG LOBECTOMY  09/28/2009   right upper  . THORACOTOMY  09/28/2009   mini  . VIDEO  ASSISTED THORACOSCOPY  09/28/2009    REVIEW OF SYSTEMS:  A comprehensive review of systems was negative.   PHYSICAL EXAMINATION: General appearance: alert, cooperative and no distress Head: Normocephalic, without obvious abnormality, atraumatic Neck: no adenopathy, no JVD, supple, symmetrical, trachea midline and thyroid not enlarged, symmetric, no tenderness/mass/nodules Lymph nodes: Cervical, supraclavicular, and axillary nodes normal. Resp: clear to auscultation bilaterally Back: symmetric, no curvature. ROM normal. No CVA tenderness. Cardio: regular rate and rhythm, S1, S2 normal, no murmur, click, rub or gallop GI: soft, non-tender; bowel sounds normal; no masses,  no organomegaly Extremities: extremities normal, atraumatic, no cyanosis or edema  ECOG PERFORMANCE STATUS: 1 - Symptomatic but completely ambulatory  Blood pressure (!) 160/74, pulse 70, temperature 97.6 F (36.4 C), temperature source Oral, resp. rate 18, height '5\' 5"'$  (1.651 m), weight 139 lb 12.8 oz (63.4 kg), SpO2 100 %.  LABORATORY DATA: Lab Results  Component Value Date   WBC 8.1 12/10/2016   HGB 13.1 12/10/2016   HCT 39.4 12/10/2016   MCV 87.5 12/10/2016   PLT 218 12/10/2016      Chemistry      Component Value Date/Time   NA 140 11/26/2016 1048   K 4.0 11/26/2016 1048   CL 105 01/11/2015 1603   CL 107 03/30/2013 1036   CO2 22 11/26/2016 1048   BUN 13.8 11/26/2016 1048   CREATININE 1.2 (H) 11/26/2016 1048      Component Value Date/Time   CALCIUM 10.5 (H) 11/26/2016 1048   ALKPHOS 79 11/26/2016 1048   AST 12 11/26/2016 1048   ALT 15 11/26/2016 1048   BILITOT 0.44 11/26/2016 1048       RADIOGRAPHIC STUDIES: No results found.  ASSESSMENT AND PLAN:  This is a very pleasant 76 years old African-American female with recurrent non-small cell lung cancer, adenocarcinoma. She is status post several chemotherapy regimens and she is currently on treatment with Nivolumab every 2 weeks status post 20  cycles. The patient is tolerating the treatment well. I recommended for her to proceed with cycle #21 today as a scheduled. She would come back for follow-up visit in 2 weeks for evaluation after repeating CT scan of the chest for restaging of her disease. For hypertension, she will continue on her current treatment with Norvasc and lisinopril. She was advised to call immediately if she has any concerning symptoms in the interval. The patient voices understanding of current disease status and treatment options and is in agreement with the current care plan.  All questions were answered. The patient knows to call the clinic with any problems, questions or concerns. We can certainly see the patient much sooner if necessary. I spent 10 minutes counseling the patient face to face. The total time spent in the appointment was 15 minutes.  Disclaimer: This note was dictated with voice recognition software. Similar sounding words can inadvertently be transcribed and may not be corrected upon review.

## 2016-12-10 NOTE — Patient Instructions (Signed)
[facility] Discharge Instructions for Patients Receiving Chemotherapy  Today you received the following chemotherapy agents Nivolumab.  To help prevent nausea and vomiting after your treatment, we encourage you to take your nausea medication as prescribed.   If you develop nausea and vomiting that is not controlled by your nausea medication, call the clinic.   BELOW ARE SYMPTOMS THAT SHOULD BE REPORTED IMMEDIATELY:  *FEVER GREATER THAN 100.5 F  *CHILLS WITH OR WITHOUT FEVER  NAUSEA AND VOMITING THAT IS NOT CONTROLLED WITH YOUR NAUSEA MEDICATION  *UNUSUAL SHORTNESS OF BREATH  *UNUSUAL BRUISING OR BLEEDING  TENDERNESS IN MOUTH AND THROAT WITH OR WITHOUT PRESENCE OF ULCERS  *URINARY PROBLEMS  *BOWEL PROBLEMS  UNUSUAL RASH Items with * indicate a potential emergency and should be followed up as soon as possible.  Feel free to call the clinic you have any questions or concerns. The clinic phone number is [phone].  Please show the CHEMO ALERT CARD at check-in to the Emergency Department and triage nurse.   

## 2016-12-10 NOTE — Telephone Encounter (Signed)
Labs, chemo and follow up appointments with Dr Julien Nordmann, scheduled every 2 weeks per 12/10/16 los. Patient was given a copy of the AVS report and appointment schedule per 12/10/16 los.

## 2016-12-20 ENCOUNTER — Ambulatory Visit (HOSPITAL_COMMUNITY)
Admission: RE | Admit: 2016-12-20 | Discharge: 2016-12-20 | Disposition: A | Discharge status: Home / Self Care | Payer: Medicare Other | Source: Ambulatory Visit | Attending: Internal Medicine | Admitting: Internal Medicine

## 2016-12-20 ENCOUNTER — Encounter (HOSPITAL_COMMUNITY): Payer: Self-pay

## 2016-12-20 DIAGNOSIS — C3491 Malignant neoplasm of unspecified part of right bronchus or lung: Secondary | ICD-10-CM | POA: Diagnosis present

## 2016-12-20 DIAGNOSIS — R918 Other nonspecific abnormal finding of lung field: Secondary | ICD-10-CM | POA: Insufficient documentation

## 2016-12-20 DIAGNOSIS — C3492 Malignant neoplasm of unspecified part of left bronchus or lung: Secondary | ICD-10-CM | POA: Insufficient documentation

## 2016-12-20 DIAGNOSIS — Z5112 Encounter for antineoplastic immunotherapy: Secondary | ICD-10-CM

## 2016-12-20 MED ORDER — IOPAMIDOL (ISOVUE-300) INJECTION 61%
INTRAVENOUS | Status: AC
Start: 2016-12-20 — End: 2016-12-20
  Filled 2016-12-20: qty 75

## 2016-12-20 MED ORDER — IOPAMIDOL (ISOVUE-300) INJECTION 61%
75.0000 mL | Freq: Once | INTRAVENOUS | Status: AC | PRN
  Administered 2016-12-20: 75 mL via INTRAVENOUS

## 2016-12-24 ENCOUNTER — Ambulatory Visit (HOSPITAL_BASED_OUTPATIENT_CLINIC_OR_DEPARTMENT_OTHER): Payer: Medicare Other

## 2016-12-24 ENCOUNTER — Ambulatory Visit (HOSPITAL_BASED_OUTPATIENT_CLINIC_OR_DEPARTMENT_OTHER): Payer: Medicare Other | Admitting: Internal Medicine

## 2016-12-24 ENCOUNTER — Other Ambulatory Visit (HOSPITAL_BASED_OUTPATIENT_CLINIC_OR_DEPARTMENT_OTHER): Payer: Medicare Other

## 2016-12-24 ENCOUNTER — Encounter: Payer: Self-pay | Admitting: Internal Medicine

## 2016-12-24 VITALS — BP 118/66 | HR 87 | Temp 97.6°F | Resp 19 | Ht 65.0 in | Wt 139.7 lb

## 2016-12-24 DIAGNOSIS — E538 Deficiency of other specified B group vitamins: Secondary | ICD-10-CM | POA: Diagnosis not present

## 2016-12-24 DIAGNOSIS — C3491 Malignant neoplasm of unspecified part of right bronchus or lung: Secondary | ICD-10-CM

## 2016-12-24 DIAGNOSIS — R200 Anesthesia of skin: Secondary | ICD-10-CM

## 2016-12-24 DIAGNOSIS — E039 Hypothyroidism, unspecified: Secondary | ICD-10-CM

## 2016-12-24 DIAGNOSIS — Z5112 Encounter for antineoplastic immunotherapy: Secondary | ICD-10-CM

## 2016-12-24 DIAGNOSIS — C3492 Malignant neoplasm of unspecified part of left bronchus or lung: Secondary | ICD-10-CM

## 2016-12-24 DIAGNOSIS — I10 Essential (primary) hypertension: Secondary | ICD-10-CM

## 2016-12-24 LAB — COMPREHENSIVE METABOLIC PANEL
ALT: 13 U/L (ref 0–55)
AST: 14 U/L (ref 5–34)
Albumin: 4.3 g/dL (ref 3.5–5.0)
Alkaline Phosphatase: 77 U/L (ref 40–150)
Anion Gap: 10 mEq/L (ref 3–11)
BUN: 15.7 mg/dL (ref 7.0–26.0)
CO2: 24 meq/L (ref 22–29)
CREATININE: 1.3 mg/dL — AB (ref 0.6–1.1)
Calcium: 10.8 mg/dL — ABNORMAL HIGH (ref 8.4–10.4)
Chloride: 106 mEq/L (ref 98–109)
EGFR: 48 mL/min/{1.73_m2} — ABNORMAL LOW (ref >90)
Glucose: 78 mg/dl (ref 70–140)
Potassium: 4.1 mEq/L (ref 3.5–5.1)
SODIUM: 141 meq/L (ref 136–145)
Total Bilirubin: 0.42 mg/dL (ref 0.20–1.20)
Total Protein: 8 g/dL (ref 6.4–8.3)

## 2016-12-24 LAB — CBC WITH DIFFERENTIAL/PLATELET
BASO%: 1.2 % (ref 0.0–2.0)
Basophils Absolute: 0.1 10*3/uL (ref 0.0–0.1)
EOS%: 2.6 % (ref 0.0–7.0)
Eosinophils Absolute: 0.3 10*3/uL (ref 0.0–0.5)
HCT: 40.7 % (ref 34.8–46.6)
HGB: 13.4 g/dL (ref 11.6–15.9)
LYMPH%: 17.3 % (ref 14.0–49.7)
MCH: 28.9 pg (ref 25.1–34.0)
MCHC: 32.9 g/dL (ref 31.5–36.0)
MCV: 87.8 fL (ref 79.5–101.0)
MONO#: 0.7 10*3/uL (ref 0.1–0.9)
MONO%: 7.1 % (ref 0.0–14.0)
NEUT#: 7 10*3/uL — ABNORMAL HIGH (ref 1.5–6.5)
NEUT%: 71.8 % (ref 38.4–76.8)
Platelets: 232 10*3/uL (ref 145–400)
RBC: 4.64 10*6/uL (ref 3.70–5.45)
RDW: 14.6 % — ABNORMAL HIGH (ref 11.2–14.5)
WBC: 9.7 10*3/uL (ref 3.9–10.3)
lymph#: 1.7 10*3/uL (ref 0.9–3.3)

## 2016-12-24 LAB — TSH: TSH: 0.399 m(IU)/L (ref 0.308–3.960)

## 2016-12-24 MED ORDER — HEPARIN SOD (PORK) LOCK FLUSH 100 UNIT/ML IV SOLN
500.0000 [IU] | Freq: Once | INTRAVENOUS | Status: AC | PRN
  Administered 2016-12-24: 500 [IU]
  Filled 2016-12-24: qty 5

## 2016-12-24 MED ORDER — CYANOCOBALAMIN 1000 MCG/ML IJ SOLN
1000.0000 ug | Freq: Once | INTRAMUSCULAR | Status: AC
  Administered 2016-12-24: 1000 ug via INTRAMUSCULAR

## 2016-12-24 MED ORDER — SODIUM CHLORIDE 0.9 % IV SOLN
Freq: Once | INTRAVENOUS | Status: AC
  Administered 2016-12-24: 12:00:00 via INTRAVENOUS

## 2016-12-24 MED ORDER — CYANOCOBALAMIN 1000 MCG/ML IJ SOLN
INTRAMUSCULAR | Status: AC
  Filled 2016-12-24: qty 1

## 2016-12-24 MED ORDER — SODIUM CHLORIDE 0.9 % IV SOLN
240.0000 mg | Freq: Once | INTRAVENOUS | Status: AC
  Administered 2016-12-24: 240 mg via INTRAVENOUS
  Filled 2016-12-24: qty 20

## 2016-12-24 MED ORDER — SODIUM CHLORIDE 0.9% FLUSH
10.0000 mL | INTRAVENOUS | Status: DC | PRN
  Administered 2016-12-24: 10 mL
  Filled 2016-12-24: qty 10

## 2016-12-24 NOTE — Patient Instructions (Signed)
[facility] Discharge Instructions for Patients Receiving Chemotherapy  Today you received the following chemotherapy agents Nivolumab.  To help prevent nausea and vomiting after your treatment, we encourage you to take your nausea medication as prescribed.   If you develop nausea and vomiting that is not controlled by your nausea medication, call the clinic.   BELOW ARE SYMPTOMS THAT SHOULD BE REPORTED IMMEDIATELY:  *FEVER GREATER THAN 100.5 F  *CHILLS WITH OR WITHOUT FEVER  NAUSEA AND VOMITING THAT IS NOT CONTROLLED WITH YOUR NAUSEA MEDICATION  *UNUSUAL SHORTNESS OF BREATH  *UNUSUAL BRUISING OR BLEEDING  TENDERNESS IN MOUTH AND THROAT WITH OR WITHOUT PRESENCE OF ULCERS  *URINARY PROBLEMS  *BOWEL PROBLEMS  UNUSUAL RASH Items with * indicate a potential emergency and should be followed up as soon as possible.  Feel free to call the clinic you have any questions or concerns. The clinic phone number is [phone].  Please show the CHEMO ALERT CARD at check-in to the Emergency Department and triage nurse.   

## 2016-12-24 NOTE — Progress Notes (Signed)
[place] [phone]   [phone]  OFFICE PROGRESS NOTE  Terri Levels, NP [phone] [address]  DIAGNOSIS:  1) recurrent non-small cell lung cancer, adenocarcinoma initially diagnosed as stage IB (T2a, N0, M0) in December 2010. 2) history of acute pulmonary embolism in the right lower lobe lobe are, segmental and subsegmental pulmonary arteries diagnosed in January 2013.  PRIOR THERAPY: 1.Status post right upper lobectomy on September 28, 2009 under the care of Dr. Arlyce Dice. The patient refused adjuvant chemotherapy at that time. She had disease recurrence in June of 2012.  2.Status post 4 cycles of systemic chemotherapy with carboplatin for an AUC of 6 and paclitaxel at 200 mg per meter squared and Avastin at 15 mg/kg according to the ECOG protocol #5508.  3. Chemotherapy with Avastin 15 mg/kg according to the ECOG protocol #5508 status post 32 cycles. Last cycle was given on 06/30/2013 discontinued secondary to disease progression and persistent proteinuria. 4. Tarceva 150 mg by mouth daily, started 08/09/2013, status post 10 months of treatment. 5. Tarceva 100 mg by mouth daily started 07/02/2014, status post 19 months, discontinued secondary to disease progression.  CURRENT THERAPY: Immunotherapy with Nivolumab 240 mg IV every 2 weeks status post 21 cycles. First dose was given 02/20/2016.  INTERVAL HISTORY: [name] 76 y.o. female came to the clinic today for follow-up visit. The patient is currently undergoing treatment with immunotherapy with Nivolumab status post 21 cycles and tolerating her treatment well with no significant adverse effects. She denied having any skin rash or diarrhea. She has no nausea or vomiting. The patient denied having any chest pain, shortness of breath, cough or hemoptysis. She denied having any significant weight loss or night sweats. She has no fever or chills. She had repeat CT scan of the  chest performed recently and she is here for evaluation and discussion of her scan results.  MEDICAL HISTORY: Past Medical History:  Diagnosis Date  . Bilateral lung cancer ([place]) 03/08/2011   recurrent  . Chronic fatigue 02/14/2016  . Encounter for antineoplastic immunotherapy 02/14/2016  . Foot pain 09/02/2011  . Glaucoma   . Hip pain 09/02/2011  . Hypercholesterolemia   . Hypertension   . Hypertension 10/29/2016  . Hypothyroidism   . Lung cancer ([place]) 03/08/2011   recurrent    ALLERGIES:  is allergic to cymbalta [duloxetine hcl]; fish allergy; amitriptyline; codeine; and sulfa antibiotics.  MEDICATIONS:  Current Outpatient Prescriptions  Medication Sig Dispense Refill  . amLODipine (NORVASC) 5 MG tablet Take 10 mg by mouth daily. Per [name], NP    . aspirin 81 MG tablet Take 81 mg by mouth daily.    . clonazePAM (KLONOPIN) 0.5 MG tablet Take 0.5 mg by mouth 2 (two) times daily as needed. Per [name], NP    . cyanocobalamin (,VITAMIN B-12,) 1000 MCG/ML injection Inject 1,000 mcg into the muscle every 30 (thirty) days.    . dorzolamide-timolol (COSOPT) 22.3-6.8 MG/ML ophthalmic solution Place 2 drops into both eyes 2 (two) times daily. Per [name], NP    . fluticasone (FLONASE) 50 MCG/ACT nasal spray Place 1 spray into both nostrils daily as needed for allergies or rhinitis.    Marland Kitchen levothyroxine (SYNTHROID, LEVOTHROID) 88 MCG tablet Take 88 mcg by mouth daily before breakfast.    . lidocaine-prilocaine (EMLA) cream Apply 1 application topically as needed (prior to port access). 30 g 0  . lisinopril (PRINIVIL,ZESTRIL) 40 MG tablet Take 40  mg by mouth daily. Per [name], NP     . nitrofurantoin (MACRODANTIN) 50 MG capsule Take 50 mg by mouth daily.  2   No current facility-administered medications for this visit.     SURGICAL HISTORY:  Past Surgical History:  Procedure Laterality Date  . LUNG LOBECTOMY  09/28/2009   right upper  . THORACOTOMY  09/28/2009    mini  . VIDEO ASSISTED THORACOSCOPY  09/28/2009    REVIEW OF SYSTEMS:  Constitutional: negative Eyes: negative Ears, nose, mouth, throat, and face: negative Respiratory: negative Cardiovascular: negative Gastrointestinal: negative Genitourinary:negative Integument/breast: negative Hematologic/lymphatic: negative Musculoskeletal:negative Neurological: negative Behavioral/Psych: negative Endocrine: negative Allergic/Immunologic: negative   PHYSICAL EXAMINATION: General appearance: alert, cooperative and no distress Head: Normocephalic, without obvious abnormality, atraumatic Neck: no adenopathy, no JVD, supple, symmetrical, trachea midline and thyroid not enlarged, symmetric, no tenderness/mass/nodules Lymph nodes: Cervical, supraclavicular, and axillary nodes normal. Resp: clear to auscultation bilaterally Back: symmetric, no curvature. ROM normal. No CVA tenderness. Cardio: regular rate and rhythm, S1, S2 normal, no murmur, click, rub or gallop GI: soft, non-tender; bowel sounds normal; no masses,  no organomegaly Extremities: extremities normal, atraumatic, no cyanosis or edema Neurologic: Alert and oriented X 3, normal strength and tone. Normal symmetric reflexes. Normal coordination and gait  ECOG PERFORMANCE STATUS: 1 - Symptomatic but completely ambulatory  Blood pressure 118/66, pulse 87, temperature 97.6 F (36.4 C), resp. rate 19, height '5\' 5"'$  (1.651 m), weight 139 lb 11.2 oz (63.4 kg).  LABORATORY DATA: Lab Results  Component Value Date   WBC 9.7 12/24/2016   HGB 13.4 12/24/2016   HCT 40.7 12/24/2016   MCV 87.8 12/24/2016   PLT 232 12/24/2016      Chemistry      Component Value Date/Time   NA 140 12/10/2016 1103   K 4.4 12/10/2016 1103   CL 105 01/11/2015 1603   CL 107 03/30/2013 1036   CO2 26 12/10/2016 1103   BUN 16.1 12/10/2016 1103   CREATININE 1.2 (H) 12/10/2016 1103      Component Value Date/Time   CALCIUM 10.6 (H) 12/10/2016 1103   ALKPHOS 80  12/10/2016 1103   AST 11 12/10/2016 1103   ALT 13 12/10/2016 1103   BILITOT 0.42 12/10/2016 1103       RADIOGRAPHIC STUDIES: Ct Chest W Contrast  Result Date: 12/20/2016 CLINICAL DATA:  Bilateral lung carcinoma. Currently undergoing chemotherapy. Restaging. EXAM: CT CHEST WITH CONTRAST TECHNIQUE: Multidetector CT imaging of the chest was performed during intravenous contrast administration. CONTRAST:  31m ISOVUE-300 IOPAMIDOL (ISOVUE-300) INJECTION 61% COMPARISON:  10/25/2016 FINDINGS: Cardiovascular: No acute findings. Aortic and coronary artery atherosclerosis. Mediastinum/Nodes: 11 mm left suprahilar lymph node on image 60/2 has increased in size from 8 mm on prior study. No other pathologically enlarged lymph nodes identified within the thorax. Lungs/Pleura: Stable centrilobular emphysema and bilateral pleural-parenchymal scarring. Stable postop changes from previous right upper lobectomy. Right lower lobe pulmonary nodules measuring 10 mm on image 55/5 and 18 mm on image 80/5 show no significant change since previous study. Spiculated nodule in the superior left lower lobe measures 2.2 x 2.1 cm on image 58/5 compared with 2.4 x 1.9 cm previously. A 9 mm pulmonary nodule in the left lower lobe on image 72/5 shows mild increase in size from 6 mm previously. Other scattered tiny sub-cm bilateral pulmonary nodules show no significant change. No evidence of pleural effusion. Upper Abdomen: 10 mm lymph node in region of gastrohepatic ligament on image 130/2 remains stable. Musculoskeletal:  No  suspicious bone lesions. IMPRESSION: Slight increase in size of 9 mm left lower lobe pulmonary nodule. Other bilateral lower lobe pulmonary nodules show no significant interval change. Slight increase in size of 11 mm left suprahilar lymph node. Stable 10 mm upper abdominal gastrohepatic lymph node. Electronically Signed   By: Earle Gell M.D.   On: 12/20/2016 10:50    ASSESSMENT AND PLAN:  This is a very  pleasant 76 years old African-American female with recurrent non-small cell lung cancer, adenocarcinoma. The patient is status post several treatments in the past including systemic chemotherapy with carboplatin, paclitaxel and Avastin followed by maintenance Avastin discontinued secondary to disease progression and the proteinuria. She was also treated with Tarceva for around 29 months discontinued secondary to disease progression. She is currently on treatment with Nivolumab 240 mg IV every 2 weeks is status post 21 cycles. The patient is tolerating her treatment well with no significant adverse effects. She had repeat CT scan of the chest performed recently. I personally and independently reviewed the scan images and discuss the results with the patient today. There are slight increase in the left lower lobe pulmonary nodule as well as left suprahilar lymphadenopathy. I discussed the results with the patient and recommended for her to continue with the same treatment. She will proceed with cycle #22 today. For the hypertension, the patient will continue her current treatment with Norvasc and lisinopril. For hypothyroidism, she will continue on levothyroxine 88 g by mouth daily. I will see the patient back for follow-up visit in 2 weeks for evaluation before starting cycle #23. She was advised to call immediately if she has any concerning symptoms in the interval. The patient voices understanding of current disease status and treatment options and is in agreement with the current care plan.  All questions were answered. The patient knows to call the clinic with any problems, questions or concerns. We can certainly see the patient much sooner if necessary.  Disclaimer: This note was dictated with voice recognition software. Similar sounding words can inadvertently be transcribed and may not be corrected upon review.

## 2017-01-07 ENCOUNTER — Ambulatory Visit (HOSPITAL_BASED_OUTPATIENT_CLINIC_OR_DEPARTMENT_OTHER): Payer: Medicare Other | Admitting: Internal Medicine

## 2017-01-07 ENCOUNTER — Other Ambulatory Visit (HOSPITAL_BASED_OUTPATIENT_CLINIC_OR_DEPARTMENT_OTHER): Payer: Medicare Other

## 2017-01-07 ENCOUNTER — Encounter: Payer: Self-pay | Admitting: Internal Medicine

## 2017-01-07 ENCOUNTER — Ambulatory Visit (HOSPITAL_BASED_OUTPATIENT_CLINIC_OR_DEPARTMENT_OTHER): Payer: Medicare Other

## 2017-01-07 VITALS — BP 134/63 | HR 82 | Temp 97.6°F | Resp 21 | Ht 65.0 in | Wt 141.2 lb

## 2017-01-07 DIAGNOSIS — C3491 Malignant neoplasm of unspecified part of right bronchus or lung: Secondary | ICD-10-CM

## 2017-01-07 DIAGNOSIS — Z5112 Encounter for antineoplastic immunotherapy: Secondary | ICD-10-CM

## 2017-01-07 DIAGNOSIS — C3492 Malignant neoplasm of unspecified part of left bronchus or lung: Secondary | ICD-10-CM

## 2017-01-07 LAB — CBC WITH DIFFERENTIAL/PLATELET
BASO%: 1.3 % (ref 0.0–2.0)
Basophils Absolute: 0.1 10*3/uL (ref 0.0–0.1)
EOS%: 3.2 % (ref 0.0–7.0)
Eosinophils Absolute: 0.3 10*3/uL (ref 0.0–0.5)
HEMATOCRIT: 39.8 % (ref 34.8–46.6)
HEMOGLOBIN: 13.1 g/dL (ref 11.6–15.9)
LYMPH#: 1.9 10*3/uL (ref 0.9–3.3)
LYMPH%: 20.5 % (ref 14.0–49.7)
MCH: 28.9 pg (ref 25.1–34.0)
MCHC: 32.8 g/dL (ref 31.5–36.0)
MCV: 87.9 fL (ref 79.5–101.0)
MONO#: 0.6 10*3/uL (ref 0.1–0.9)
MONO%: 6.8 % (ref 0.0–14.0)
NEUT%: 68.2 % (ref 38.4–76.8)
NEUTROS ABS: 6.3 10*3/uL (ref 1.5–6.5)
Platelets: 256 10*3/uL (ref 145–400)
RBC: 4.53 10*6/uL (ref 3.70–5.45)
RDW: 14.3 % (ref 11.2–14.5)
WBC: 9.2 10*3/uL (ref 3.9–10.3)

## 2017-01-07 LAB — COMPREHENSIVE METABOLIC PANEL
ALBUMIN: 4.2 g/dL (ref 3.5–5.0)
ALK PHOS: 86 U/L (ref 40–150)
ALT: 13 U/L (ref 0–55)
AST: 11 U/L (ref 5–34)
Anion Gap: 11 mEq/L (ref 3–11)
BUN: 18.4 mg/dL (ref 7.0–26.0)
CALCIUM: 10.8 mg/dL — AB (ref 8.4–10.4)
CHLORIDE: 106 meq/L (ref 98–109)
CO2: 23 mEq/L (ref 22–29)
CREATININE: 1.3 mg/dL — AB (ref 0.6–1.1)
EGFR: 45 mL/min/{1.73_m2} — ABNORMAL LOW (ref >90)
GLUCOSE: 90 mg/dL (ref 70–140)
POTASSIUM: 4.4 meq/L (ref 3.5–5.1)
SODIUM: 139 meq/L (ref 136–145)
Total Bilirubin: 0.44 mg/dL (ref 0.20–1.20)
Total Protein: 7.8 g/dL (ref 6.4–8.3)

## 2017-01-07 MED ORDER — SODIUM CHLORIDE 0.9% FLUSH
10.0000 mL | INTRAVENOUS | Status: DC | PRN
  Administered 2017-01-07: 10 mL
  Filled 2017-01-07: qty 10

## 2017-01-07 MED ORDER — SODIUM CHLORIDE 0.9 % IV SOLN
Freq: Once | INTRAVENOUS | Status: AC
  Administered 2017-01-07: 11:00:00 via INTRAVENOUS

## 2017-01-07 MED ORDER — HEPARIN SOD (PORK) LOCK FLUSH 100 UNIT/ML IV SOLN
500.0000 [IU] | Freq: Once | INTRAVENOUS | Status: AC | PRN
  Administered 2017-01-07: 500 [IU]
  Filled 2017-01-07: qty 5

## 2017-01-07 MED ORDER — SODIUM CHLORIDE 0.9 % IV SOLN
240.0000 mg | Freq: Once | INTRAVENOUS | Status: AC
  Administered 2017-01-07: 240 mg via INTRAVENOUS
  Filled 2017-01-07: qty 4

## 2017-01-07 NOTE — Patient Instructions (Signed)
[facility] Discharge Instructions for Patients Receiving Chemotherapy  Today you received the following chemotherapy agents Nivolumab.  To help prevent nausea and vomiting after your treatment, we encourage you to take your nausea medication as prescribed.   If you develop nausea and vomiting that is not controlled by your nausea medication, call the clinic.   BELOW ARE SYMPTOMS THAT SHOULD BE REPORTED IMMEDIATELY:  *FEVER GREATER THAN 100.5 F  *CHILLS WITH OR WITHOUT FEVER  NAUSEA AND VOMITING THAT IS NOT CONTROLLED WITH YOUR NAUSEA MEDICATION  *UNUSUAL SHORTNESS OF BREATH  *UNUSUAL BRUISING OR BLEEDING  TENDERNESS IN MOUTH AND THROAT WITH OR WITHOUT PRESENCE OF ULCERS  *URINARY PROBLEMS  *BOWEL PROBLEMS  UNUSUAL RASH Items with * indicate a potential emergency and should be followed up as soon as possible.  Feel free to call the clinic you have any questions or concerns. The clinic phone number is [phone].  Please show the CHEMO ALERT CARD at check-in to the Emergency Department and triage nurse.   

## 2017-01-07 NOTE — Progress Notes (Signed)
[name] Telephone:(336) [phone]   [phone]  OFFICE PROGRESS NOTE  [name], NP [phone] [address] [place] [address]  DIAGNOSIS:  1) recurrent non-small cell lung cancer, adenocarcinoma initially diagnosed as stage IB (T2a, N0, M0) in December 2010. 2) history of acute pulmonary embolism in the right lower lobe lobe are, segmental and subsegmental pulmonary arteries diagnosed in January 2013.  PRIOR THERAPY: 1.Status post right upper lobectomy on September 28, 2009 under the care of Dr. Arlyce Dice. The patient refused adjuvant chemotherapy at that time. She had disease recurrence in June of 2012.  2.Status post 4 cycles of systemic chemotherapy with carboplatin for an AUC of 6 and paclitaxel at 200 mg per meter squared and Avastin at 15 mg/kg according to the ECOG protocol #5508.  3. Chemotherapy with Avastin 15 mg/kg according to the ECOG protocol #5508 status post 32 cycles. Last cycle was given on 06/30/2013 discontinued secondary to disease progression and persistent proteinuria. 4. Tarceva 150 mg by mouth daily, started 08/09/2013, status post 10 months of treatment. 5. Tarceva 100 mg by mouth daily started 07/02/2014, status post 19 months, discontinued secondary to disease progression.  CURRENT THERAPY: Immunotherapy with Nivolumab 240 mg IV every 2 weeks status post 22 cycles. First dose was given 02/20/2016.  INTERVAL HISTORY: [name] 76 y.o. female returns to the clinic today for follow-up visit. The patient is feeling fine with no specific complaints. She denied having any chest pain, shortness of breath, cough or hemoptysis. She has no fever or chills. She denied having any nausea, vomiting, diarrhea or constipation. She has no significant weight loss or night sweats. She is here today for evaluation before starting cycle #23 of her treatment.  MEDICAL HISTORY: Past Medical History:  Diagnosis Date  . Bilateral lung cancer ([place])  03/08/2011   recurrent  . Chronic fatigue 02/14/2016  . Encounter for antineoplastic immunotherapy 02/14/2016  . Foot pain 09/02/2011  . Glaucoma   . Hip pain 09/02/2011  . Hypercholesterolemia   . Hypertension   . Hypertension 10/29/2016  . Hypothyroidism   . Lung cancer ([place]) 03/08/2011   recurrent    ALLERGIES:  is allergic to cymbalta [duloxetine hcl]; fish allergy; amitriptyline; codeine; and sulfa antibiotics.  MEDICATIONS:  Current Outpatient Prescriptions  Medication Sig Dispense Refill  . amLODipine (NORVASC) 5 MG tablet Take 10 mg by mouth daily. Per [name], NP    . aspirin 81 MG tablet Take 81 mg by mouth daily.    . clonazePAM (KLONOPIN) 0.5 MG tablet Take 0.5 mg by mouth 2 (two) times daily as needed. Per [name], NP    . cyanocobalamin (,VITAMIN B-12,) 1000 MCG/ML injection Inject 1,000 mcg into the muscle every 30 (thirty) days.    . dorzolamide-timolol (COSOPT) 22.3-6.8 MG/ML ophthalmic solution Place 2 drops into both eyes 2 (two) times daily. Per [name], NP    . fluticasone (FLONASE) 50 MCG/ACT nasal spray Place 1 spray into both nostrils daily as needed for allergies or rhinitis.    Marland Kitchen levothyroxine (SYNTHROID, LEVOTHROID) 88 MCG tablet Take 88 mcg by mouth daily before breakfast.    . lidocaine-prilocaine (EMLA) cream Apply 1 application topically as needed (prior to port access). 30 g 0  . lisinopril (PRINIVIL,ZESTRIL) 40 MG tablet Take 40 mg by mouth daily. Per [name], NP     . nitrofurantoin (MACRODANTIN) 50 MG capsule Take 50 mg by mouth daily.  2   No current facility-administered  medications for this visit.     SURGICAL HISTORY:  Past Surgical History:  Procedure Laterality Date  . LUNG LOBECTOMY  09/28/2009   right upper  . THORACOTOMY  09/28/2009   mini  . VIDEO ASSISTED THORACOSCOPY  09/28/2009    REVIEW OF SYSTEMS:  A comprehensive review of systems was negative except for: Constitutional: positive for fatigue   PHYSICAL  EXAMINATION: General appearance: alert, cooperative, fatigued and no distress Head: Normocephalic, without obvious abnormality, atraumatic Neck: no adenopathy, no JVD, supple, symmetrical, trachea midline and thyroid not enlarged, symmetric, no tenderness/mass/nodules Lymph nodes: Cervical, supraclavicular, and axillary nodes normal. Resp: clear to auscultation bilaterally Back: symmetric, no curvature. ROM normal. No CVA tenderness. Cardio: regular rate and rhythm, S1, S2 normal, no murmur, click, rub or gallop GI: soft, non-tender; bowel sounds normal; no masses,  no organomegaly Extremities: extremities normal, atraumatic, no cyanosis or edema  ECOG PERFORMANCE STATUS: 1 - Symptomatic but completely ambulatory  Blood pressure 134/63, pulse 82, temperature 97.6 F (36.4 C), temperature source Oral, resp. rate (!) 21, height '5\' 5"'$  (1.651 m), weight 141 lb 3.2 oz (64 kg), SpO2 99 %.  LABORATORY DATA: Lab Results  Component Value Date   WBC 9.2 01/07/2017   HGB 13.1 01/07/2017   HCT 39.8 01/07/2017   MCV 87.9 01/07/2017   PLT 256 01/07/2017      Chemistry      Component Value Date/Time   NA 139 01/07/2017 0957   K 4.4 01/07/2017 0957   CL 105 01/11/2015 1603   CL 107 03/30/2013 1036   CO2 23 01/07/2017 0957   BUN 18.4 01/07/2017 0957   CREATININE 1.3 (H) 01/07/2017 0957      Component Value Date/Time   CALCIUM 10.8 (H) 01/07/2017 0957   ALKPHOS 86 01/07/2017 0957   AST 11 01/07/2017 0957   ALT 13 01/07/2017 0957   BILITOT 0.44 01/07/2017 0957       RADIOGRAPHIC STUDIES: Ct Chest W Contrast  Result Date: 12/20/2016 CLINICAL DATA:  Bilateral lung carcinoma. Currently undergoing chemotherapy. Restaging. EXAM: CT CHEST WITH CONTRAST TECHNIQUE: Multidetector CT imaging of the chest was performed during intravenous contrast administration. CONTRAST:  51m ISOVUE-300 IOPAMIDOL (ISOVUE-300) INJECTION 61% COMPARISON:  10/25/2016 FINDINGS: Cardiovascular: No acute findings.  Aortic and coronary artery atherosclerosis. Mediastinum/Nodes: 11 mm left suprahilar lymph node on image 60/2 has increased in size from 8 mm on prior study. No other pathologically enlarged lymph nodes identified within the thorax. Lungs/Pleura: Stable centrilobular emphysema and bilateral pleural-parenchymal scarring. Stable postop changes from previous right upper lobectomy. Right lower lobe pulmonary nodules measuring 10 mm on image 55/5 and 18 mm on image 80/5 show no significant change since previous study. Spiculated nodule in the superior left lower lobe measures 2.2 x 2.1 cm on image 58/5 compared with 2.4 x 1.9 cm previously. A 9 mm pulmonary nodule in the left lower lobe on image 72/5 shows mild increase in size from 6 mm previously. Other scattered tiny sub-cm bilateral pulmonary nodules show no significant change. No evidence of pleural effusion. Upper Abdomen: 10 mm lymph node in region of gastrohepatic ligament on image 130/2 remains stable. Musculoskeletal:  No suspicious bone lesions. IMPRESSION: Slight increase in size of 9 mm left lower lobe pulmonary nodule. Other bilateral lower lobe pulmonary nodules show no significant interval change. Slight increase in size of 11 mm left suprahilar lymph node. Stable 10 mm upper abdominal gastrohepatic lymph node. Electronically Signed   By: JEarle GellM.D.   On:  12/20/2016 10:50    ASSESSMENT AND PLAN:  This is a very pleasant 76 years old African-American female with recurrent non-small cell lung cancer, adenocarcinoma status post several treatment regimens and she is currently on immunotherapy with Nivolumab status post 22 cycles. The patient is feeling fine today. I recommended for her to proceed with her treatment today as a scheduled. She would come back for follow-up visit in 2 weeks for evaluation before starting cycle #24 of her treatment. She was advised to call immediately if she has any concerning symptoms in the interval. The patient  voices understanding of current disease status and treatment options and is in agreement with the current care plan.  All questions were answered. The patient knows to call the clinic with any problems, questions or concerns. We can certainly see the patient much sooner if necessary. I spent 10 minutes counseling the patient face to face. The total time spent in the appointment was 15 minutes.  Disclaimer: This note was dictated with voice recognition software. Similar sounding words can inadvertently be transcribed and may not be corrected upon review.

## 2017-01-10 ENCOUNTER — Telehealth: Payer: Self-pay | Admitting: Internal Medicine

## 2017-01-10 NOTE — Telephone Encounter (Signed)
Scheduled appts per 01/07/2017 los. Patient to get new schedule when they come in next visit on 4/10 .

## 2017-01-21 ENCOUNTER — Encounter: Payer: Self-pay | Admitting: Internal Medicine

## 2017-01-21 ENCOUNTER — Telehealth: Payer: Self-pay | Admitting: Internal Medicine

## 2017-01-21 ENCOUNTER — Other Ambulatory Visit (HOSPITAL_BASED_OUTPATIENT_CLINIC_OR_DEPARTMENT_OTHER): Payer: Medicare Other

## 2017-01-21 ENCOUNTER — Ambulatory Visit (HOSPITAL_BASED_OUTPATIENT_CLINIC_OR_DEPARTMENT_OTHER): Payer: Medicare Other | Admitting: Internal Medicine

## 2017-01-21 ENCOUNTER — Ambulatory Visit (HOSPITAL_BASED_OUTPATIENT_CLINIC_OR_DEPARTMENT_OTHER): Payer: Medicare Other

## 2017-01-21 VITALS — Resp 20

## 2017-01-21 VITALS — BP 137/69 | HR 86 | Temp 97.7°F | Resp 86 | Ht 65.0 in | Wt 142.0 lb

## 2017-01-21 DIAGNOSIS — C3491 Malignant neoplasm of unspecified part of right bronchus or lung: Secondary | ICD-10-CM

## 2017-01-21 DIAGNOSIS — C3492 Malignant neoplasm of unspecified part of left bronchus or lung: Secondary | ICD-10-CM

## 2017-01-21 DIAGNOSIS — E538 Deficiency of other specified B group vitamins: Secondary | ICD-10-CM | POA: Diagnosis not present

## 2017-01-21 DIAGNOSIS — Z5112 Encounter for antineoplastic immunotherapy: Secondary | ICD-10-CM | POA: Diagnosis not present

## 2017-01-21 DIAGNOSIS — E039 Hypothyroidism, unspecified: Secondary | ICD-10-CM | POA: Diagnosis not present

## 2017-01-21 DIAGNOSIS — R200 Anesthesia of skin: Secondary | ICD-10-CM

## 2017-01-21 LAB — CBC WITH DIFFERENTIAL/PLATELET
BASO%: 1.1 % (ref 0.0–2.0)
BASOS ABS: 0.1 10*3/uL (ref 0.0–0.1)
EOS ABS: 0.3 10*3/uL (ref 0.0–0.5)
EOS%: 3.7 % (ref 0.0–7.0)
HCT: 40.4 % (ref 34.8–46.6)
HEMOGLOBIN: 13.3 g/dL (ref 11.6–15.9)
LYMPH%: 22.5 % (ref 14.0–49.7)
MCH: 28.7 pg (ref 25.1–34.0)
MCHC: 33 g/dL (ref 31.5–36.0)
MCV: 86.8 fL (ref 79.5–101.0)
MONO#: 0.6 10*3/uL (ref 0.1–0.9)
MONO%: 6.4 % (ref 0.0–14.0)
NEUT%: 66.3 % (ref 38.4–76.8)
NEUTROS ABS: 5.9 10*3/uL (ref 1.5–6.5)
PLATELETS: 226 10*3/uL (ref 145–400)
RBC: 4.65 10*6/uL (ref 3.70–5.45)
RDW: 14.7 % — ABNORMAL HIGH (ref 11.2–14.5)
WBC: 8.9 10*3/uL (ref 3.9–10.3)
lymph#: 2 10*3/uL (ref 0.9–3.3)

## 2017-01-21 LAB — COMPREHENSIVE METABOLIC PANEL
ALBUMIN: 4.3 g/dL (ref 3.5–5.0)
ALK PHOS: 79 U/L (ref 40–150)
ALT: 18 U/L (ref 0–55)
AST: 15 U/L (ref 5–34)
Anion Gap: 11 mEq/L (ref 3–11)
BILIRUBIN TOTAL: 0.47 mg/dL (ref 0.20–1.20)
BUN: 13.7 mg/dL (ref 7.0–26.0)
CO2: 23 mEq/L (ref 22–29)
Calcium: 10.5 mg/dL — ABNORMAL HIGH (ref 8.4–10.4)
Chloride: 107 mEq/L (ref 98–109)
Creatinine: 1.2 mg/dL — ABNORMAL HIGH (ref 0.6–1.1)
EGFR: 49 mL/min/{1.73_m2} — AB (ref >90)
GLUCOSE: 86 mg/dL (ref 70–140)
POTASSIUM: 4.1 meq/L (ref 3.5–5.1)
SODIUM: 141 meq/L (ref 136–145)
TOTAL PROTEIN: 7.8 g/dL (ref 6.4–8.3)

## 2017-01-21 MED ORDER — CYANOCOBALAMIN 1000 MCG/ML IJ SOLN
INTRAMUSCULAR | Status: AC
  Filled 2017-01-21: qty 1

## 2017-01-21 MED ORDER — SODIUM CHLORIDE 0.9% FLUSH
10.0000 mL | INTRAVENOUS | Status: DC | PRN
  Administered 2017-01-21: 10 mL
  Filled 2017-01-21: qty 10

## 2017-01-21 MED ORDER — SODIUM CHLORIDE 0.9 % IV SOLN
240.0000 mg | Freq: Once | INTRAVENOUS | Status: AC
  Administered 2017-01-21: 240 mg via INTRAVENOUS
  Filled 2017-01-21: qty 20

## 2017-01-21 MED ORDER — SODIUM CHLORIDE 0.9 % IV SOLN
Freq: Once | INTRAVENOUS | Status: AC
  Administered 2017-01-21: 12:00:00 via INTRAVENOUS

## 2017-01-21 MED ORDER — CYANOCOBALAMIN 1000 MCG/ML IJ SOLN
1000.0000 ug | Freq: Once | INTRAMUSCULAR | Status: AC
  Administered 2017-01-21: 1000 ug via INTRAMUSCULAR

## 2017-01-21 MED ORDER — HEPARIN SOD (PORK) LOCK FLUSH 100 UNIT/ML IV SOLN
500.0000 [IU] | Freq: Once | INTRAVENOUS | Status: AC | PRN
  Administered 2017-01-21: 500 [IU]
  Filled 2017-01-21: qty 5

## 2017-01-21 NOTE — Telephone Encounter (Signed)
Appointments scheduled per 01/21/17 los. Patient was given a copy of the AVS report and appointment schedule per 01/21/17 los. °

## 2017-01-21 NOTE — Progress Notes (Signed)
[place] [phone]   [phone]  OFFICE PROGRESS NOTE  [name], NP [phone] [address] [place] [address]  DIAGNOSIS:  1) recurrent non-small cell lung cancer, adenocarcinoma initially diagnosed as stage IB (T2a, N0, M0) in December 2010. 2) history of acute pulmonary embolism in the right lower lobe lobe are, segmental and subsegmental pulmonary arteries diagnosed in January 2013.  PRIOR THERAPY: 1.Status post right upper lobectomy on September 28, 2009 under the care of Dr. Arlyce Dice. The patient refused adjuvant chemotherapy at that time. She had disease recurrence in June of 2012.  2.Status post 4 cycles of systemic chemotherapy with carboplatin for an AUC of 6 and paclitaxel at 200 mg per meter squared and Avastin at 15 mg/kg according to the ECOG protocol #5508.  3. Chemotherapy with Avastin 15 mg/kg according to the ECOG protocol #5508 status post 32 cycles. Last cycle was given on 06/30/2013 discontinued secondary to disease progression and persistent proteinuria. 4. Tarceva 150 mg by mouth daily, started 08/09/2013, status post 10 months of treatment. 5. Tarceva 100 mg by mouth daily started 07/02/2014, status post 19 months, discontinued secondary to disease progression.  CURRENT THERAPY: Immunotherapy with Nivolumab 240 mg IV every 2 weeks status post 23 cycles. First dose was given 02/20/2016.  INTERVAL HISTORY: [name] 76 y.o. female returns to the clinic today for follow-up visit. The patient is feeling fine today with no specific complaints except for shortness of breath with exertion. She denied having any significant chest pain, cough or hemoptysis. She gained few pounds since her last visit. She denied having any nausea, vomiting, diarrhea or constipation. She has no fever or chills. She is tolerating her current treatment with immunotherapy fairly well. She is here today for evaluation before starting cycle  #24.   MEDICAL HISTORY: Past Medical History:  Diagnosis Date  . Bilateral lung cancer ([place]) 03/08/2011   recurrent  . Chronic fatigue 02/14/2016  . Encounter for antineoplastic immunotherapy 02/14/2016  . Foot pain 09/02/2011  . Glaucoma   . Hip pain 09/02/2011  . Hypercholesterolemia   . Hypertension   . Hypertension 10/29/2016  . Hypothyroidism   . Lung cancer ([place]) 03/08/2011   recurrent    ALLERGIES:  is allergic to cymbalta [duloxetine hcl]; fish allergy; amitriptyline; codeine; and sulfa antibiotics.  MEDICATIONS:  Current Outpatient Prescriptions  Medication Sig Dispense Refill  . amLODipine (NORVASC) 5 MG tablet Take 10 mg by mouth daily. Per [name], NP    . aspirin 81 MG tablet Take 81 mg by mouth daily.    . clonazePAM (KLONOPIN) 0.5 MG tablet Take 0.5 mg by mouth 2 (two) times daily as needed. Per [name], NP    . cyanocobalamin (,VITAMIN B-12,) 1000 MCG/ML injection Inject 1,000 mcg into the muscle every 30 (thirty) days.    . dorzolamide-timolol (COSOPT) 22.3-6.8 MG/ML ophthalmic solution Place 2 drops into both eyes 2 (two) times daily. Per [name], NP    . fluticasone (FLONASE) 50 MCG/ACT nasal spray Place 1 spray into both nostrils daily as needed for allergies or rhinitis.    Marland Kitchen levothyroxine (SYNTHROID, LEVOTHROID) 88 MCG tablet Take 88 mcg by mouth daily before breakfast.    . lidocaine-prilocaine (EMLA) cream Apply 1 application topically as needed (prior to port access). 30 g 0  . lisinopril (PRINIVIL,ZESTRIL) 40 MG tablet Take 40 mg by mouth daily. Per [name], NP     . nitrofurantoin (MACRODANTIN) 50 MG capsule  Take 50 mg by mouth daily.  2   No current facility-administered medications for this visit.     SURGICAL HISTORY:  Past Surgical History:  Procedure Laterality Date  . LUNG LOBECTOMY  09/28/2009   right upper  . THORACOTOMY  09/28/2009   mini  . VIDEO ASSISTED THORACOSCOPY  09/28/2009    REVIEW OF SYSTEMS:  A  comprehensive review of systems was negative except for: Respiratory: positive for dyspnea on exertion   PHYSICAL EXAMINATION: General appearance: alert, cooperative and no distress Head: Normocephalic, without obvious abnormality, atraumatic Neck: no adenopathy, no JVD, supple, symmetrical, trachea midline and thyroid not enlarged, symmetric, no tenderness/mass/nodules Lymph nodes: Cervical, supraclavicular, and axillary nodes normal. Resp: clear to auscultation bilaterally Back: symmetric, no curvature. ROM normal. No CVA tenderness. Cardio: regular rate and rhythm, S1, S2 normal, no murmur, click, rub or gallop GI: soft, non-tender; bowel sounds normal; no masses,  no organomegaly Extremities: extremities normal, atraumatic, no cyanosis or edema  ECOG PERFORMANCE STATUS: 1 - Symptomatic but completely ambulatory  Blood pressure 137/69, pulse 86, temperature 97.7 F (36.5 C), temperature source Oral, resp. rate (!) 86, height '5\' 5"'$  (1.651 m), weight 142 lb (64.4 kg), SpO2 100 %.  LABORATORY DATA: Lab Results  Component Value Date   WBC 8.9 01/21/2017   HGB 13.3 01/21/2017   HCT 40.4 01/21/2017   MCV 86.8 01/21/2017   PLT 226 01/21/2017      Chemistry      Component Value Date/Time   NA 141 01/21/2017 1017   K 4.1 01/21/2017 1017   CL 105 01/11/2015 1603   CL 107 03/30/2013 1036   CO2 23 01/21/2017 1017   BUN 13.7 01/21/2017 1017   CREATININE 1.2 (H) 01/21/2017 1017      Component Value Date/Time   CALCIUM 10.5 (H) 01/21/2017 1017   ALKPHOS 79 01/21/2017 1017   AST 15 01/21/2017 1017   ALT 18 01/21/2017 1017   BILITOT 0.47 01/21/2017 1017       RADIOGRAPHIC STUDIES: No results found.  ASSESSMENT AND PLAN:  This is a very pleasant 76 years old African-American female with recurrent non-small cell lung cancer, adenocarcinoma. The patient underwent several chemotherapy regimens as well as treatment with Tarceva. She is currently on treatment with Nivolumab status  post 23 cycles. She is tolerating this treatment well with no significant adverse effects. I recommended for the patient to continue her current treatment with Nivolumab and she will start cycle #24 today. I will see her back for follow-up visit in 2 weeks for evaluation before starting cycle #25. She was advised to call immediately if she has any concerning symptoms in the interval. The patient voices understanding of current disease status and treatment options and is in agreement with the current care plan. All questions were answered. The patient knows to call the clinic with any problems, questions or concerns. We can certainly see the patient much sooner if necessary. I spent 10 minutes counseling the patient face to face. The total time spent in the appointment was 15 minutes.   Disclaimer: This note was dictated with voice recognition software. Similar sounding words can inadvertently be transcribed and may not be corrected upon review.

## 2017-01-21 NOTE — Patient Instructions (Signed)
[facility] Discharge Instructions for Patients Receiving Chemotherapy  Today you received the following chemotherapy agents:  Nivolumab.  To help prevent nausea and vomiting after your treatment, we encourage you to take your nausea medication as directed.   If you develop nausea and vomiting that is not controlled by your nausea medication, call the clinic.   BELOW ARE SYMPTOMS THAT SHOULD BE REPORTED IMMEDIATELY:  *FEVER GREATER THAN 100.5 F  *CHILLS WITH OR WITHOUT FEVER  NAUSEA AND VOMITING THAT IS NOT CONTROLLED WITH YOUR NAUSEA MEDICATION  *UNUSUAL SHORTNESS OF BREATH  *UNUSUAL BRUISING OR BLEEDING  TENDERNESS IN MOUTH AND THROAT WITH OR WITHOUT PRESENCE OF ULCERS  *URINARY PROBLEMS  *BOWEL PROBLEMS  UNUSUAL RASH Items with * indicate a potential emergency and should be followed up as soon as possible.  Feel free to call the clinic you have any questions or concerns. The clinic phone number is [phone].  Please show the CHEMO ALERT CARD at check-in to the Emergency Department and triage nurse.   

## 2017-02-04 ENCOUNTER — Encounter: Payer: Self-pay | Admitting: Internal Medicine

## 2017-02-04 ENCOUNTER — Ambulatory Visit (HOSPITAL_BASED_OUTPATIENT_CLINIC_OR_DEPARTMENT_OTHER): Payer: Medicare Other

## 2017-02-04 ENCOUNTER — Ambulatory Visit (HOSPITAL_BASED_OUTPATIENT_CLINIC_OR_DEPARTMENT_OTHER): Payer: Medicare Other | Admitting: Internal Medicine

## 2017-02-04 ENCOUNTER — Other Ambulatory Visit (HOSPITAL_BASED_OUTPATIENT_CLINIC_OR_DEPARTMENT_OTHER): Payer: Medicare Other

## 2017-02-04 VITALS — BP 135/63 | HR 85 | Temp 97.4°F | Resp 19 | Ht 65.0 in | Wt 137.8 lb

## 2017-02-04 DIAGNOSIS — C3492 Malignant neoplasm of unspecified part of left bronchus or lung: Secondary | ICD-10-CM

## 2017-02-04 DIAGNOSIS — Z5112 Encounter for antineoplastic immunotherapy: Secondary | ICD-10-CM

## 2017-02-04 DIAGNOSIS — C3491 Malignant neoplasm of unspecified part of right bronchus or lung: Secondary | ICD-10-CM

## 2017-02-04 LAB — CBC WITH DIFFERENTIAL/PLATELET
BASO%: 1 % (ref 0.0–2.0)
BASOS ABS: 0.1 10*3/uL (ref 0.0–0.1)
EOS%: 3.4 % (ref 0.0–7.0)
Eosinophils Absolute: 0.3 10*3/uL (ref 0.0–0.5)
HCT: 41.2 % (ref 34.8–46.6)
HEMOGLOBIN: 13.6 g/dL (ref 11.6–15.9)
LYMPH%: 23.9 % (ref 14.0–49.7)
MCH: 28.8 pg (ref 25.1–34.0)
MCHC: 32.9 g/dL (ref 31.5–36.0)
MCV: 87.6 fL (ref 79.5–101.0)
MONO#: 0.6 10*3/uL (ref 0.1–0.9)
MONO%: 6.9 % (ref 0.0–14.0)
NEUT%: 64.8 % (ref 38.4–76.8)
NEUTROS ABS: 5.5 10*3/uL (ref 1.5–6.5)
Platelets: 230 10*3/uL (ref 145–400)
RBC: 4.71 10*6/uL (ref 3.70–5.45)
RDW: 14.7 % — AB (ref 11.2–14.5)
WBC: 8.5 10*3/uL (ref 3.9–10.3)
lymph#: 2 10*3/uL (ref 0.9–3.3)

## 2017-02-04 LAB — COMPREHENSIVE METABOLIC PANEL
ALBUMIN: 4.2 g/dL (ref 3.5–5.0)
ALT: 13 U/L (ref 0–55)
AST: 12 U/L (ref 5–34)
Alkaline Phosphatase: 81 U/L (ref 40–150)
Anion Gap: 11 mEq/L (ref 3–11)
BUN: 11.5 mg/dL (ref 7.0–26.0)
CO2: 24 meq/L (ref 22–29)
Calcium: 10.6 mg/dL — ABNORMAL HIGH (ref 8.4–10.4)
Chloride: 106 mEq/L (ref 98–109)
Creatinine: 1.3 mg/dL — ABNORMAL HIGH (ref 0.6–1.1)
EGFR: 48 mL/min/{1.73_m2} — ABNORMAL LOW (ref >90)
GLUCOSE: 85 mg/dL (ref 70–140)
POTASSIUM: 4.3 meq/L (ref 3.5–5.1)
SODIUM: 141 meq/L (ref 136–145)
Total Bilirubin: 0.45 mg/dL (ref 0.20–1.20)
Total Protein: 7.7 g/dL (ref 6.4–8.3)

## 2017-02-04 MED ORDER — SODIUM CHLORIDE 0.9% FLUSH
10.0000 mL | INTRAVENOUS | Status: DC | PRN
  Administered 2017-02-04: 10 mL
  Filled 2017-02-04: qty 10

## 2017-02-04 MED ORDER — SODIUM CHLORIDE 0.9 % IV SOLN
Freq: Once | INTRAVENOUS | Status: AC
Start: 2017-02-04 — End: 2017-02-04
  Administered 2017-02-04: 14:00:00 via INTRAVENOUS

## 2017-02-04 MED ORDER — HEPARIN SOD (PORK) LOCK FLUSH 100 UNIT/ML IV SOLN
500.0000 [IU] | Freq: Once | INTRAVENOUS | Status: AC | PRN
  Administered 2017-02-04: 500 [IU]
  Filled 2017-02-04: qty 5

## 2017-02-04 MED ORDER — SODIUM CHLORIDE 0.9 % IV SOLN
240.0000 mg | Freq: Once | INTRAVENOUS | Status: AC
  Administered 2017-02-04: 240 mg via INTRAVENOUS
  Filled 2017-02-04: qty 20

## 2017-02-04 NOTE — Patient Instructions (Signed)
[facility] Discharge Instructions for Patients Receiving Chemotherapy  Today you received the following chemotherapy agents Opdivo.  To help prevent nausea and vomiting after your treatment, we encourage you to take your nausea medication as directed.   If you develop nausea and vomiting that is not controlled by your nausea medication, call the clinic.   BELOW ARE SYMPTOMS THAT SHOULD BE REPORTED IMMEDIATELY:  *FEVER GREATER THAN 100.5 F  *CHILLS WITH OR WITHOUT FEVER  NAUSEA AND VOMITING THAT IS NOT CONTROLLED WITH YOUR NAUSEA MEDICATION  *UNUSUAL SHORTNESS OF BREATH  *UNUSUAL BRUISING OR BLEEDING  TENDERNESS IN MOUTH AND THROAT WITH OR WITHOUT PRESENCE OF ULCERS  *URINARY PROBLEMS  *BOWEL PROBLEMS  UNUSUAL RASH Items with * indicate a potential emergency and should be followed up as soon as possible.  Feel free to call the clinic you have any questions or concerns. The clinic phone number is [phone].  Please show the CHEMO ALERT CARD at check-in to the Emergency Department and triage nurse.    

## 2017-02-04 NOTE — Progress Notes (Signed)
[place] [phone]   [phone]  OFFICE PROGRESS NOTE  [name], NP [phone] [place] [address]  DIAGNOSIS:  1) recurrent non-small cell lung cancer, adenocarcinoma initially diagnosed as stage IB (T2a, N0, M0) in December 2010. 2) history of acute pulmonary embolism in the right lower lobe lobe are, segmental and subsegmental pulmonary arteries diagnosed in January 2013.  PRIOR THERAPY: 1.Status post right upper lobectomy on September 28, 2009 under the care of Dr. Arlyce Dice. The patient refused adjuvant chemotherapy at that time. She had disease recurrence in June of 2012.  2.Status post 4 cycles of systemic chemotherapy with carboplatin for an AUC of 6 and paclitaxel at 200 mg per meter squared and Avastin at 15 mg/kg according to the ECOG protocol #5508.  3. Chemotherapy with Avastin 15 mg/kg according to the ECOG protocol #5508 status post 32 cycles. Last cycle was given on 06/30/2013 discontinued secondary to disease progression and persistent proteinuria. 4. Tarceva 150 mg by mouth daily, started 08/09/2013, status post 10 months of treatment. 5. Tarceva 100 mg by mouth daily started 07/02/2014, status post 19 months, discontinued secondary to disease progression.  CURRENT THERAPY: Immunotherapy with Nivolumab 240 mg IV every 2 weeks status post 24 cycles. First dose was given 02/20/2016.  INTERVAL HISTORY: [name] 76 y.o. female returns to the clinic today for follow-up visit. The patient is doing fine with no specific complaints except for occasional fatigue. She denied having any chest pain, shortness of breath, cough or hemoptysis. She has no fever or chills. She denied having any nausea, vomiting, diarrhea or constipation. She is tolerating her current treatment with Nivolumab fairly well. She is here today for evaluation before starting cycle #25.   MEDICAL HISTORY: Past Medical History:  Diagnosis Date  .  Bilateral lung cancer ([place]) 03/08/2011   recurrent  . Chronic fatigue 02/14/2016  . Encounter for antineoplastic immunotherapy 02/14/2016  . Foot pain 09/02/2011  . Glaucoma   . Hip pain 09/02/2011  . Hypercholesterolemia   . Hypertension   . Hypertension 10/29/2016  . Hypothyroidism   . Lung cancer ([place]) 03/08/2011   recurrent    ALLERGIES:  is allergic to cymbalta [duloxetine hcl]; fish allergy; amitriptyline; codeine; and sulfa antibiotics.  MEDICATIONS:  Current Outpatient Prescriptions  Medication Sig Dispense Refill  . amLODipine (NORVASC) 5 MG tablet Take 10 mg by mouth daily. Per [name], NP    . aspirin 81 MG tablet Take 81 mg by mouth daily.    . clonazePAM (KLONOPIN) 0.5 MG tablet Take 0.5 mg by mouth 2 (two) times daily as needed. Per [name], NP    . cyanocobalamin (,VITAMIN B-12,) 1000 MCG/ML injection Inject 1,000 mcg into the muscle every 30 (thirty) days.    . dorzolamide-timolol (COSOPT) 22.3-6.8 MG/ML ophthalmic solution Place 2 drops into both eyes 2 (two) times daily. Per [name], NP    . fluticasone (FLONASE) 50 MCG/ACT nasal spray Place 1 spray into both nostrils daily as needed for allergies or rhinitis.    Marland Kitchen levothyroxine (SYNTHROID, LEVOTHROID) 88 MCG tablet Take 88 mcg by mouth daily before breakfast.    . lidocaine-prilocaine (EMLA) cream Apply 1 application topically as needed (prior to port access). 30 g 0  . lisinopril (PRINIVIL,ZESTRIL) 40 MG tablet Take 40 mg by mouth daily. Per [name], NP     . nitrofurantoin (MACRODANTIN) 50 MG capsule Take 50 mg by mouth daily.  2  No current facility-administered medications for this visit.     SURGICAL HISTORY:  Past Surgical History:  Procedure Laterality Date  . LUNG LOBECTOMY  09/28/2009   right upper  . THORACOTOMY  09/28/2009   mini  . VIDEO ASSISTED THORACOSCOPY  09/28/2009    REVIEW OF SYSTEMS:  A comprehensive review of systems was negative.   PHYSICAL EXAMINATION: General  appearance: alert, cooperative and no distress Head: Normocephalic, without obvious abnormality, atraumatic Neck: no adenopathy, no JVD, supple, symmetrical, trachea midline and thyroid not enlarged, symmetric, no tenderness/mass/nodules Lymph nodes: Cervical, supraclavicular, and axillary nodes normal. Resp: clear to auscultation bilaterally Back: symmetric, no curvature. ROM normal. No CVA tenderness. Cardio: regular rate and rhythm, S1, S2 normal, no murmur, click, rub or gallop GI: soft, non-tender; bowel sounds normal; no masses,  no organomegaly Extremities: extremities normal, atraumatic, no cyanosis or edema  ECOG PERFORMANCE STATUS: 1 - Symptomatic but completely ambulatory  Blood pressure 135/63, pulse 85, temperature 97.4 F (36.3 C), temperature source Oral, resp. rate 19, height '5\' 5"'$  (1.651 m), weight 137 lb 12.8 oz (62.5 kg), SpO2 100 %.  LABORATORY DATA: Lab Results  Component Value Date   WBC 8.5 02/04/2017   HGB 13.6 02/04/2017   HCT 41.2 02/04/2017   MCV 87.6 02/04/2017   PLT 230 02/04/2017      Chemistry      Component Value Date/Time   NA 141 02/04/2017 1102   K 4.3 02/04/2017 1102   CL 105 01/11/2015 1603   CL 107 03/30/2013 1036   CO2 24 02/04/2017 1102   BUN 11.5 02/04/2017 1102   CREATININE 1.3 (H) 02/04/2017 1102      Component Value Date/Time   CALCIUM 10.6 (H) 02/04/2017 1102   ALKPHOS 81 02/04/2017 1102   AST 12 02/04/2017 1102   ALT 13 02/04/2017 1102   BILITOT 0.45 02/04/2017 1102       RADIOGRAPHIC STUDIES: No results found.  ASSESSMENT AND PLAN:  This is a very pleasant 76 years old African-American female with recurrent non-small cell lung cancer, adenocarcinoma status post several chemotherapy regimens as well as treatment with oral Tarceva. The patient is currently on treatment with Nivolumab status post 24 cycles and tolerating her treatment well with no significant adverse effects. I recommended for her to proceed with cycle  #25 today as scheduled. I will see her back for follow-up visit in 2 weeks for evaluation before starting cycle #26. The patient was advised to call immediately if she has any concerning symptoms in the interval. The patient voices understanding of current disease status and treatment options and is in agreement with the current care plan. All questions were answered. The patient knows to call the clinic with any problems, questions or concerns. We can certainly see the patient much sooner if necessary. I spent 10 minutes counseling the patient face to face. The total time spent in the appointment was 15 minutes.   Disclaimer: This note was dictated with voice recognition software. Similar sounding words can inadvertently be transcribed and may not be corrected upon review.

## 2017-02-07 ENCOUNTER — Telehealth: Payer: Self-pay | Admitting: Internal Medicine

## 2017-02-07 NOTE — Telephone Encounter (Signed)
Scheduled appt per 4/24 los - patient is aware of additional appts and will pick up a new schedule the next time she comes in

## 2017-02-18 ENCOUNTER — Encounter: Payer: Self-pay | Admitting: Internal Medicine

## 2017-02-18 ENCOUNTER — Telehealth: Payer: Self-pay | Admitting: Internal Medicine

## 2017-02-18 ENCOUNTER — Ambulatory Visit (HOSPITAL_BASED_OUTPATIENT_CLINIC_OR_DEPARTMENT_OTHER): Payer: Medicare Other

## 2017-02-18 ENCOUNTER — Other Ambulatory Visit (HOSPITAL_BASED_OUTPATIENT_CLINIC_OR_DEPARTMENT_OTHER): Payer: Medicare Other

## 2017-02-18 ENCOUNTER — Ambulatory Visit (HOSPITAL_BASED_OUTPATIENT_CLINIC_OR_DEPARTMENT_OTHER): Payer: Medicare Other | Admitting: Internal Medicine

## 2017-02-18 VITALS — BP 130/70 | HR 83 | Temp 97.7°F | Resp 18 | Ht 65.0 in | Wt 139.4 lb

## 2017-02-18 DIAGNOSIS — C3492 Malignant neoplasm of unspecified part of left bronchus or lung: Secondary | ICD-10-CM

## 2017-02-18 DIAGNOSIS — Z5112 Encounter for antineoplastic immunotherapy: Secondary | ICD-10-CM

## 2017-02-18 DIAGNOSIS — Z86711 Personal history of pulmonary embolism: Secondary | ICD-10-CM | POA: Diagnosis not present

## 2017-02-18 DIAGNOSIS — R530 Neoplastic (malignant) related fatigue: Secondary | ICD-10-CM | POA: Diagnosis not present

## 2017-02-18 DIAGNOSIS — R200 Anesthesia of skin: Secondary | ICD-10-CM

## 2017-02-18 DIAGNOSIS — C3491 Malignant neoplasm of unspecified part of right bronchus or lung: Secondary | ICD-10-CM

## 2017-02-18 DIAGNOSIS — E538 Deficiency of other specified B group vitamins: Secondary | ICD-10-CM | POA: Diagnosis not present

## 2017-02-18 DIAGNOSIS — E039 Hypothyroidism, unspecified: Secondary | ICD-10-CM

## 2017-02-18 LAB — CBC WITH DIFFERENTIAL/PLATELET
BASO%: 0.9 % (ref 0.0–2.0)
BASOS ABS: 0.1 10*3/uL (ref 0.0–0.1)
EOS%: 3.7 % (ref 0.0–7.0)
Eosinophils Absolute: 0.3 10*3/uL (ref 0.0–0.5)
HCT: 39.4 % (ref 34.8–46.6)
HGB: 13.3 g/dL (ref 11.6–15.9)
LYMPH%: 22.5 % (ref 14.0–49.7)
MCH: 29.4 pg (ref 25.1–34.0)
MCHC: 33.6 g/dL (ref 31.5–36.0)
MCV: 87.4 fL (ref 79.5–101.0)
MONO#: 0.6 10*3/uL (ref 0.1–0.9)
MONO%: 6.7 % (ref 0.0–14.0)
NEUT#: 6.2 10*3/uL (ref 1.5–6.5)
NEUT%: 66.2 % (ref 38.4–76.8)
Platelets: 215 10*3/uL (ref 145–400)
RBC: 4.51 10*6/uL (ref 3.70–5.45)
RDW: 14.7 % — ABNORMAL HIGH (ref 11.2–14.5)
WBC: 9.4 10*3/uL (ref 3.9–10.3)
lymph#: 2.1 10*3/uL (ref 0.9–3.3)

## 2017-02-18 LAB — COMPREHENSIVE METABOLIC PANEL
ALT: 15 U/L (ref 0–55)
ANION GAP: 8 meq/L (ref 3–11)
AST: 12 U/L (ref 5–34)
Albumin: 4.2 g/dL (ref 3.5–5.0)
Alkaline Phosphatase: 75 U/L (ref 40–150)
BUN: 15 mg/dL (ref 7.0–26.0)
CALCIUM: 10.4 mg/dL (ref 8.4–10.4)
CHLORIDE: 107 meq/L (ref 98–109)
CO2: 24 meq/L (ref 22–29)
CREATININE: 1.3 mg/dL — AB (ref 0.6–1.1)
EGFR: 45 mL/min/{1.73_m2} — ABNORMAL LOW (ref >90)
Glucose: 80 mg/dl (ref 70–140)
POTASSIUM: 4.2 meq/L (ref 3.5–5.1)
Sodium: 139 mEq/L (ref 136–145)
Total Bilirubin: 0.38 mg/dL (ref 0.20–1.20)
Total Protein: 7.6 g/dL (ref 6.4–8.3)

## 2017-02-18 MED ORDER — HEPARIN SOD (PORK) LOCK FLUSH 100 UNIT/ML IV SOLN
500.0000 [IU] | Freq: Once | INTRAVENOUS | Status: AC | PRN
  Administered 2017-02-18: 500 [IU]
  Filled 2017-02-18: qty 5

## 2017-02-18 MED ORDER — SODIUM CHLORIDE 0.9 % IV SOLN
240.0000 mg | Freq: Once | INTRAVENOUS | Status: AC
  Administered 2017-02-18: 240 mg via INTRAVENOUS
  Filled 2017-02-18: qty 20

## 2017-02-18 MED ORDER — CYANOCOBALAMIN 1000 MCG/ML IJ SOLN
INTRAMUSCULAR | Status: AC
Start: 2017-02-18 — End: 2017-02-18
  Filled 2017-02-18: qty 1

## 2017-02-18 MED ORDER — SODIUM CHLORIDE 0.9 % IV SOLN
Freq: Once | INTRAVENOUS | Status: AC
  Administered 2017-02-18: 14:00:00 via INTRAVENOUS

## 2017-02-18 MED ORDER — CYANOCOBALAMIN 1000 MCG/ML IJ SOLN
1000.0000 ug | Freq: Once | INTRAMUSCULAR | Status: AC
  Administered 2017-02-18: 1000 ug via INTRAMUSCULAR

## 2017-02-18 MED ORDER — SODIUM CHLORIDE 0.9% FLUSH
10.0000 mL | INTRAVENOUS | Status: DC | PRN
  Administered 2017-02-18: 10 mL
  Filled 2017-02-18: qty 10

## 2017-02-18 NOTE — Progress Notes (Signed)
[place] [phone]   [phone]) [phone]  OFFICE PROGRESS NOTE  [name], NP [phone] [place] [address]  DIAGNOSIS:  1) recurrent non-small cell lung cancer, adenocarcinoma initially diagnosed as stage IB (T2a, N0, M0) in December 2010. 2) history of acute pulmonary embolism in the right lower lobe lobe are, segmental and subsegmental pulmonary arteries diagnosed in January 2013.  PRIOR THERAPY: 1.Status post right upper lobectomy on September 28, 2009 under the care of Dr. Arlyce Dice. The patient refused adjuvant chemotherapy at that time. She had disease recurrence in June of 2012.  2.Status post 4 cycles of systemic chemotherapy with carboplatin for an AUC of 6 and paclitaxel at 200 mg per meter squared and Avastin at 15 mg/kg according to the ECOG protocol #5508.  3. Chemotherapy with Avastin 15 mg/kg according to the ECOG protocol #5508 status post 32 cycles. Last cycle was given on 06/30/2013 discontinued secondary to disease progression and persistent proteinuria. 4. Tarceva 150 mg by mouth daily, started 08/09/2013, status post 10 months of treatment. 5. Tarceva 100 mg by mouth daily started 07/02/2014, status post 19 months, discontinued secondary to disease progression.  CURRENT THERAPY: Immunotherapy with Nivolumab 240 mg IV every 2 weeks status post 25 cycles. First dose was given 02/20/2016.  INTERVAL HISTORY: [name] 76 y.o. female returns to the clinic today for follow-up visit. The patient is feeling fine today with no specific complaints. She denied having any chest pain, shortness of breath, cough or hemoptysis. She has no fever or chills. She is complaining of fatigue. She also had few episodes of diarrhea last night after she ate some foods from her neighbors. She is feeling better today. She is here today for evaluation before starting cycle #26 of her treatment.  MEDICAL HISTORY: Past Medical History:    Diagnosis Date  . Bilateral lung cancer ([place]) 03/08/2011   recurrent  . Chronic fatigue 02/14/2016  . Encounter for antineoplastic immunotherapy 02/14/2016  . Foot pain 09/02/2011  . Glaucoma   . Hip pain 09/02/2011  . Hypercholesterolemia   . Hypertension   . Hypertension 10/29/2016  . Hypothyroidism   . Lung cancer ([place]) 03/08/2011   recurrent    ALLERGIES:  is allergic to cymbalta [duloxetine hcl]; fish allergy; amitriptyline; codeine; and sulfa antibiotics.  MEDICATIONS:  Current Outpatient Prescriptions  Medication Sig Dispense Refill  . amLODipine (NORVASC) 5 MG tablet Take 10 mg by mouth daily. Per [name], NP    . aspirin 81 MG tablet Take 81 mg by mouth daily.    . clonazePAM (KLONOPIN) 0.5 MG tablet Take 0.5 mg by mouth 2 (two) times daily as needed. Per [name], NP    . cyanocobalamin (,VITAMIN B-12,) 1000 MCG/ML injection Inject 1,000 mcg into the muscle every 30 (thirty) days.    . dorzolamide-timolol (COSOPT) 22.3-6.8 MG/ML ophthalmic solution Place 2 drops into both eyes 2 (two) times daily. Per [name], NP    . fluticasone (FLONASE) 50 MCG/ACT nasal spray Place 1 spray into both nostrils daily as needed for allergies or rhinitis.    Marland Kitchen levothyroxine (SYNTHROID, LEVOTHROID) 88 MCG tablet Take 88 mcg by mouth daily before breakfast.    . lidocaine-prilocaine (EMLA) cream Apply 1 application topically as needed (prior to port access). 30 g 0  . lisinopril (PRINIVIL,ZESTRIL) 40 MG tablet Take 40 mg by mouth daily. Per [name], NP     . nitrofurantoin (MACRODANTIN) 50 MG capsule Take 50  mg by mouth daily.  2   No current facility-administered medications for this visit.     SURGICAL HISTORY:  Past Surgical History:  Procedure Laterality Date  . LUNG LOBECTOMY  09/28/2009   right upper  . THORACOTOMY  09/28/2009   mini  . VIDEO ASSISTED THORACOSCOPY  09/28/2009    REVIEW OF SYSTEMS:  A comprehensive review of systems was negative except for:  Constitutional: positive for fatigue   PHYSICAL EXAMINATION: General appearance: alert, cooperative, fatigued and no distress Head: Normocephalic, without obvious abnormality, atraumatic Neck: no adenopathy, no JVD, supple, symmetrical, trachea midline and thyroid not enlarged, symmetric, no tenderness/mass/nodules Lymph nodes: Cervical, supraclavicular, and axillary nodes normal. Resp: clear to auscultation bilaterally Back: symmetric, no curvature. ROM normal. No CVA tenderness. Cardio: regular rate and rhythm, S1, S2 normal, no murmur, click, rub or gallop GI: soft, non-tender; bowel sounds normal; no masses,  no organomegaly Extremities: extremities normal, atraumatic, no cyanosis or edema  ECOG PERFORMANCE STATUS: 1 - Symptomatic but completely ambulatory  Blood pressure 130/70, pulse 83, temperature 97.7 F (36.5 C), temperature source Oral, resp. rate 18, height '5\' 5"'$  (1.651 m), weight 139 lb 6.4 oz (63.2 kg), SpO2 99 %.  LABORATORY DATA: Lab Results  Component Value Date   WBC 9.4 02/18/2017   HGB 13.3 02/18/2017   HCT 39.4 02/18/2017   MCV 87.4 02/18/2017   PLT 215 02/18/2017      Chemistry      Component Value Date/Time   NA 141 02/04/2017 1102   K 4.3 02/04/2017 1102   CL 105 01/11/2015 1603   CL 107 03/30/2013 1036   CO2 24 02/04/2017 1102   BUN 11.5 02/04/2017 1102   CREATININE 1.3 (H) 02/04/2017 1102      Component Value Date/Time   CALCIUM 10.6 (H) 02/04/2017 1102   ALKPHOS 81 02/04/2017 1102   AST 12 02/04/2017 1102   ALT 13 02/04/2017 1102   BILITOT 0.45 02/04/2017 1102       RADIOGRAPHIC STUDIES: No results found.  ASSESSMENT AND PLAN:  This is a very pleasant 76 years old African-American female with recurrent non-small cell lung cancer, adenocarcinoma status post several chemotherapy regimens. She is currently on treatment with Nivolumab every 2 weeks is status post 25 cycles. The patient is tolerating the treatment well. I recommended for  her to proceed with cycle #26 today as scheduled. I will see her back for follow-up visit in 2 weeks for evaluation after repeating CT scan of the chest for restaging of her disease. She was advised to call immediately if she has any concerning symptoms in the interval. The patient voices understanding of current disease status and treatment options and is in agreement with the current care plan. All questions were answered. The patient knows to call the clinic with any problems, questions or concerns. We can certainly see the patient much sooner if necessary. I spent 10 minutes counseling the patient face to face. The total time spent in the appointment was 15 minutes.   Disclaimer: This note was dictated with voice recognition software. Similar sounding words can inadvertently be transcribed and may not be corrected upon review.

## 2017-02-18 NOTE — Telephone Encounter (Signed)
Appts already scheduled per 5/8 los. - 3  Cycles already scheduled - no additional appts scheduled . [facility] to contact patient to schedule ct.

## 2017-02-28 ENCOUNTER — Encounter (HOSPITAL_COMMUNITY): Payer: Self-pay

## 2017-02-28 ENCOUNTER — Ambulatory Visit (HOSPITAL_COMMUNITY)
Admission: RE | Admit: 2017-02-28 | Discharge: 2017-02-28 | Disposition: A | Discharge status: Home / Self Care | Payer: Medicare Other | Source: Ambulatory Visit | Attending: Internal Medicine | Admitting: Internal Medicine

## 2017-02-28 DIAGNOSIS — E039 Hypothyroidism, unspecified: Secondary | ICD-10-CM | POA: Insufficient documentation

## 2017-02-28 DIAGNOSIS — I2510 Atherosclerotic heart disease of native coronary artery without angina pectoris: Secondary | ICD-10-CM | POA: Diagnosis not present

## 2017-02-28 DIAGNOSIS — C3491 Malignant neoplasm of unspecified part of right bronchus or lung: Secondary | ICD-10-CM | POA: Insufficient documentation

## 2017-02-28 DIAGNOSIS — J439 Emphysema, unspecified: Secondary | ICD-10-CM | POA: Insufficient documentation

## 2017-02-28 DIAGNOSIS — I700 Atherosclerosis of aorta: Secondary | ICD-10-CM | POA: Insufficient documentation

## 2017-02-28 DIAGNOSIS — C3492 Malignant neoplasm of unspecified part of left bronchus or lung: Secondary | ICD-10-CM | POA: Diagnosis present

## 2017-02-28 DIAGNOSIS — Z5112 Encounter for antineoplastic immunotherapy: Secondary | ICD-10-CM

## 2017-02-28 MED ORDER — IOPAMIDOL (ISOVUE-300) INJECTION 61%
75.0000 mL | Freq: Once | INTRAVENOUS | Status: AC | PRN
  Administered 2017-02-28: 75 mL via INTRAVENOUS

## 2017-02-28 MED ORDER — IOPAMIDOL (ISOVUE-300) INJECTION 61%
INTRAVENOUS | Status: AC
  Administered 2017-02-28: 75 mL via INTRAVENOUS
  Filled 2017-02-28: qty 75

## 2017-03-04 ENCOUNTER — Encounter: Payer: Self-pay | Admitting: Internal Medicine

## 2017-03-04 ENCOUNTER — Other Ambulatory Visit (HOSPITAL_BASED_OUTPATIENT_CLINIC_OR_DEPARTMENT_OTHER): Payer: Medicare Other

## 2017-03-04 ENCOUNTER — Ambulatory Visit (HOSPITAL_BASED_OUTPATIENT_CLINIC_OR_DEPARTMENT_OTHER): Payer: Medicare Other | Admitting: Internal Medicine

## 2017-03-04 ENCOUNTER — Ambulatory Visit (HOSPITAL_BASED_OUTPATIENT_CLINIC_OR_DEPARTMENT_OTHER): Payer: Medicare Other

## 2017-03-04 VITALS — BP 132/61 | HR 77 | Temp 97.0°F | Resp 18 | Ht 65.0 in | Wt 137.0 lb

## 2017-03-04 DIAGNOSIS — I10 Essential (primary) hypertension: Secondary | ICD-10-CM | POA: Diagnosis not present

## 2017-03-04 DIAGNOSIS — C3492 Malignant neoplasm of unspecified part of left bronchus or lung: Secondary | ICD-10-CM

## 2017-03-04 DIAGNOSIS — C3491 Malignant neoplasm of unspecified part of right bronchus or lung: Secondary | ICD-10-CM

## 2017-03-04 DIAGNOSIS — E039 Hypothyroidism, unspecified: Secondary | ICD-10-CM | POA: Diagnosis not present

## 2017-03-04 DIAGNOSIS — Z5112 Encounter for antineoplastic immunotherapy: Secondary | ICD-10-CM

## 2017-03-04 DIAGNOSIS — R300 Dysuria: Secondary | ICD-10-CM | POA: Diagnosis not present

## 2017-03-04 HISTORY — DX: Dysuria: R30.0

## 2017-03-04 LAB — COMPREHENSIVE METABOLIC PANEL
ALBUMIN: 4.2 g/dL (ref 3.5–5.0)
ALK PHOS: 79 U/L (ref 40–150)
ALT: 12 U/L (ref 0–55)
ANION GAP: 10 meq/L (ref 3–11)
AST: 12 U/L (ref 5–34)
BILIRUBIN TOTAL: 0.44 mg/dL (ref 0.20–1.20)
BUN: 12.4 mg/dL (ref 7.0–26.0)
CO2: 24 meq/L (ref 22–29)
CREATININE: 1.2 mg/dL — AB (ref 0.6–1.1)
Calcium: 10.4 mg/dL (ref 8.4–10.4)
Chloride: 105 mEq/L (ref 98–109)
EGFR: 48 mL/min/{1.73_m2} — ABNORMAL LOW (ref >90)
GLUCOSE: 84 mg/dL (ref 70–140)
Potassium: 4.4 mEq/L (ref 3.5–5.1)
Sodium: 139 mEq/L (ref 136–145)
TOTAL PROTEIN: 7.6 g/dL (ref 6.4–8.3)

## 2017-03-04 LAB — CBC WITH DIFFERENTIAL/PLATELET
BASO%: 0.6 % (ref 0.0–2.0)
Basophils Absolute: 0.1 10*3/uL (ref 0.0–0.1)
EOS ABS: 0.4 10*3/uL (ref 0.0–0.5)
EOS%: 3.5 % (ref 0.0–7.0)
HCT: 39.7 % (ref 34.8–46.6)
HEMOGLOBIN: 13.2 g/dL (ref 11.6–15.9)
LYMPH#: 2.2 10*3/uL (ref 0.9–3.3)
LYMPH%: 20.8 % (ref 14.0–49.7)
MCH: 29.5 pg (ref 25.1–34.0)
MCHC: 33.2 g/dL (ref 31.5–36.0)
MCV: 88.6 fL (ref 79.5–101.0)
MONO#: 0.8 10*3/uL (ref 0.1–0.9)
MONO%: 7.6 % (ref 0.0–14.0)
NEUT%: 67.5 % (ref 38.4–76.8)
NEUTROS ABS: 7 10*3/uL — AB (ref 1.5–6.5)
PLATELETS: 232 10*3/uL (ref 145–400)
RBC: 4.48 10*6/uL (ref 3.70–5.45)
RDW: 14.5 % (ref 11.2–14.5)
WBC: 10.4 10*3/uL — ABNORMAL HIGH (ref 3.9–10.3)

## 2017-03-04 MED ORDER — SODIUM CHLORIDE 0.9% FLUSH
10.0000 mL | INTRAVENOUS | Status: DC | PRN
  Administered 2017-03-04: 10 mL
  Filled 2017-03-04: qty 10

## 2017-03-04 MED ORDER — SODIUM CHLORIDE 0.9 % IV SOLN
Freq: Once | INTRAVENOUS | Status: AC
  Administered 2017-03-04: 12:00:00 via INTRAVENOUS

## 2017-03-04 MED ORDER — SODIUM CHLORIDE 0.9 % IV SOLN
240.0000 mg | Freq: Once | INTRAVENOUS | Status: AC
  Administered 2017-03-04: 240 mg via INTRAVENOUS
  Filled 2017-03-04: qty 24

## 2017-03-04 MED ORDER — HEPARIN SOD (PORK) LOCK FLUSH 100 UNIT/ML IV SOLN
500.0000 [IU] | Freq: Once | INTRAVENOUS | Status: AC | PRN
  Administered 2017-03-04: 500 [IU]
  Filled 2017-03-04: qty 5

## 2017-03-04 NOTE — Patient Instructions (Signed)
[facility] Discharge Instructions for Patients Receiving Chemotherapy  Today you received the following chemotherapy agents Opdivo.  To help prevent nausea and vomiting after your treatment, we encourage you to take your nausea medication as directed.   If you develop nausea and vomiting that is not controlled by your nausea medication, call the clinic.   BELOW ARE SYMPTOMS THAT SHOULD BE REPORTED IMMEDIATELY:  *FEVER GREATER THAN 100.5 F  *CHILLS WITH OR WITHOUT FEVER  NAUSEA AND VOMITING THAT IS NOT CONTROLLED WITH YOUR NAUSEA MEDICATION  *UNUSUAL SHORTNESS OF BREATH  *UNUSUAL BRUISING OR BLEEDING  TENDERNESS IN MOUTH AND THROAT WITH OR WITHOUT PRESENCE OF ULCERS  *URINARY PROBLEMS  *BOWEL PROBLEMS  UNUSUAL RASH Items with * indicate a potential emergency and should be followed up as soon as possible.  Feel free to call the clinic you have any questions or concerns. The clinic phone number is [phone].  Please show the CHEMO ALERT CARD at check-in to the Emergency Department and triage nurse.    

## 2017-03-04 NOTE — Progress Notes (Signed)
[facility] Telephone:(336) [phone]   Fax:(336) [phone]  OFFICE PROGRESS NOTE  [name], NP [phone] [place] [address]  DIAGNOSIS:  1) recurrent non-small cell lung cancer, adenocarcinoma initially diagnosed as stage IB (T2a, N0, M0) in December 2010. 2) history of acute pulmonary embolism in the right lower lobe lobe are, segmental and subsegmental pulmonary arteries diagnosed in January 2013.  PRIOR THERAPY: 1.Status post right upper lobectomy on September 28, 2009 under the care of Dr. Arlyce Dice. The patient refused adjuvant chemotherapy at that time. She had disease recurrence in June of 2012.  2.Status post 4 cycles of systemic chemotherapy with carboplatin for an AUC of 6 and paclitaxel at 200 mg per meter squared and Avastin at 15 mg/kg according to the ECOG protocol #5508.  3. Chemotherapy with Avastin 15 mg/kg according to the ECOG protocol #5508 status post 32 cycles. Last cycle was given on 06/30/2013 discontinued secondary to disease progression and persistent proteinuria. 4. Tarceva 150 mg by mouth daily, started 08/09/2013, status post 10 months of treatment. 5. Tarceva 100 mg by mouth daily started 07/02/2014, status post 19 months, discontinued secondary to disease progression.  CURRENT THERAPY: Immunotherapy with Nivolumab 240 mg IV every 2 weeks status post 26 cycles. First dose was given 02/20/2016.  INTERVAL HISTORY: [name] 76 y.o. female returns to the clinic today for follow-up visit. The patient is feeling fine today with no specific complaints except for dysuria. She was on Macrodantin prophylactic doses for 3 months that was discontinued recently and she started having some symptoms. She is followed by urology for her urinary tract infection. She is tolerating her treatment with Nivolumab fairly well. She denied having any chest pain, shortness of breath, cough or hemoptysis. She has no nausea, vomiting, diarrhea or  constipation. She denied having any fever or chills. She had CT scan of the chest performed recently and she is here for evaluation and discussion of her scan results and treatment options.   MEDICAL HISTORY: Past Medical History:  Diagnosis Date  . Bilateral lung cancer ([place]) 03/08/2011   recurrent  . Chronic fatigue 02/14/2016  . Encounter for antineoplastic immunotherapy 02/14/2016  . Foot pain 09/02/2011  . Glaucoma   . Hip pain 09/02/2011  . Hypercholesterolemia   . Hypertension   . Hypertension 10/29/2016  . Hypothyroidism   . Lung cancer ([place]) 03/08/2011   recurrent    ALLERGIES:  is allergic to cymbalta [duloxetine hcl]; fish allergy; amitriptyline; codeine; and sulfa antibiotics.  MEDICATIONS:  Current Outpatient Prescriptions  Medication Sig Dispense Refill  . amLODipine (NORVASC) 5 MG tablet Take 10 mg by mouth daily. Per [name], NP    . aspirin 81 MG tablet Take 81 mg by mouth daily.    . clonazePAM (KLONOPIN) 0.5 MG tablet Take 0.5 mg by mouth 2 (two) times daily as needed. Per [name], NP    . cyanocobalamin (,VITAMIN B-12,) 1000 MCG/ML injection Inject 1,000 mcg into the muscle every 30 (thirty) days.    . dorzolamide-timolol (COSOPT) 22.3-6.8 MG/ML ophthalmic solution Place 2 drops into both eyes 2 (two) times daily. Per [name], NP    . fluticasone (FLONASE) 50 MCG/ACT nasal spray Place 1 spray into both nostrils daily as needed for allergies or rhinitis.    Marland Kitchen levothyroxine (SYNTHROID, LEVOTHROID) 88 MCG tablet Take 88 mcg by mouth daily before breakfast.    . lidocaine-prilocaine (EMLA) cream Apply 1 application topically as needed (prior to  port access). 30 g 0  . lisinopril (PRINIVIL,ZESTRIL) 40 MG tablet Take 40 mg by mouth daily. Per [name], NP     . nitrofurantoin (MACRODANTIN) 50 MG capsule Take 50 mg by mouth daily.  2   No current facility-administered medications for this visit.     SURGICAL HISTORY:  Past Surgical History:    Procedure Laterality Date  . LUNG LOBECTOMY  09/28/2009   right upper  . THORACOTOMY  09/28/2009   mini  . VIDEO ASSISTED THORACOSCOPY  09/28/2009    REVIEW OF SYSTEMS:  Constitutional: positive for fatigue Eyes: negative Ears, nose, mouth, throat, and face: negative Respiratory: negative Cardiovascular: negative Gastrointestinal: negative Genitourinary:positive for dysuria Integument/breast: negative Hematologic/lymphatic: negative Musculoskeletal:negative Neurological: negative Behavioral/Psych: negative Endocrine: negative Allergic/Immunologic: negative   PHYSICAL EXAMINATION: General appearance: alert, cooperative, fatigued and no distress Head: Normocephalic, without obvious abnormality, atraumatic Neck: no adenopathy, no JVD, supple, symmetrical, trachea midline and thyroid not enlarged, symmetric, no tenderness/mass/nodules Lymph nodes: Cervical, supraclavicular, and axillary nodes normal. Resp: clear to auscultation bilaterally Back: symmetric, no curvature. ROM normal. No CVA tenderness. Cardio: regular rate and rhythm, S1, S2 normal, no murmur, click, rub or gallop GI: soft, non-tender; bowel sounds normal; no masses,  no organomegaly Extremities: extremities normal, atraumatic, no cyanosis or edema Neurologic: Alert and oriented X 3, normal strength and tone. Normal symmetric reflexes. Normal coordination and gait  ECOG PERFORMANCE STATUS: 1 - Symptomatic but completely ambulatory  Blood pressure 132/61, pulse 77, temperature 97 F (36.1 C), temperature source Oral, resp. rate 18, height '5\' 5"'$  (1.651 m), weight 137 lb (62.1 kg).  LABORATORY DATA: Lab Results  Component Value Date   WBC 10.4 (H) 03/04/2017   HGB 13.2 03/04/2017   HCT 39.7 03/04/2017   MCV 88.6 03/04/2017   PLT 232 03/04/2017      Chemistry      Component Value Date/Time   NA 139 02/18/2017 1118   K 4.2 02/18/2017 1118   CL 105 01/11/2015 1603   CL 107 03/30/2013 1036   CO2 24  02/18/2017 1118   BUN 15.0 02/18/2017 1118   CREATININE 1.3 (H) 02/18/2017 1118      Component Value Date/Time   CALCIUM 10.4 02/18/2017 1118   ALKPHOS 75 02/18/2017 1118   AST 12 02/18/2017 1118   ALT 15 02/18/2017 1118   BILITOT 0.38 02/18/2017 1118       RADIOGRAPHIC STUDIES: Ct Chest W Contrast  Result Date: 02/28/2017 CLINICAL DATA:  Followup lung cancer. EXAM: CT CHEST WITH CONTRAST TECHNIQUE: Multidetector CT imaging of the chest was performed during intravenous contrast administration. CONTRAST:  75 cc of Isovue-300 COMPARISON:  12/20/2016 FINDINGS: Cardiovascular: The heart size appears normal. No pericardial effusion identified. Aortic atherosclerosis identified. Calcification within the LAD, left circumflex and RCA coronary artery noted. Mediastinum/Nodes: The trachea appears patent and is midline. Normal appearance of the esophagus. Index left hilar node measures 1.2 cm, image 60 of series 2. Previously 1.1 cm. Lungs/Pleura: No pleural effusion. Moderate to advanced changes of centrilobular emphysema. Bilateral pleuroparenchymal scarring noted. Status post right upper lobectomy. Lesion within the superior segment of the left lower lobe measures 1.9 x 2.0 cm, image 60 of series 7. Previously 2.2 x 2.1 cm. Left lower lobe perihilar nodule Measures 1.1 cm, image 74 of series 7. Previously 0.9 cm. Subpleural nodule within the medial right lower lobe Measures 2 cm, image 79 of series 7. Previously 1.8 cm. Peripheral nodule within the right lower lobe Measures 1 cm, image 51  of series 7. Previously this measured the same. Subpleural part solid lesion within the right lower lobe measures 2.2 cm, image 81 of series 7. Previously this measured the same. Upper Abdomen: Calcified granulomas identified within the liver and spleen. Small cyst in right lobe of liver is stable. The adrenal glands are unremarkable. Musculoskeletal: No aggressive lytic or sclerotic bone lesions. IMPRESSION: 1. The  enlarging index lesion within the left lower lobe continues to increase in size and now measures 11 mm (previously 9 mm. ). 2. Subpleural nodule within the medial right lower lobe also demonstrates mild increase in size in the interval. 3. Dominant lesion within the superior segment of left lower lobe demonstrates mild decrease in the interval. 4. Stable left hilar adenopathy. 5. Aortic Atherosclerosis (ICD10-I70.0) and Emphysema (ICD10-J43.9). 6. Multi vessel coronary artery calcification. Electronically Signed   By: Kerby Moors M.D.   On: 02/28/2017 14:50    ASSESSMENT AND PLAN:  This is a very pleasant 76 years old African-American female with recurrent non-small cell lung cancer, adenocarcinoma status post several systemic chemotherapy regimens and she is currently on treatment with Nivolumab 240 mg IV every 2 weeks is status post 26 cycles and has been tolerating her treatment fairly well. She had repeat CT scan of the chest performed recently. I personally and independently reviewed the scan images and discuss the results with the patient today. His scan showed stable disease in general except for slight increase in some of the pulmonary nodules. I recommended for the patient to continue her current treatment with Nivolumab and she will proceed with cycle #27 today as a scheduled. For the dysuria, I strongly recommend for the patient to contact her urologist for reevaluation and consideration of resuming her prophylactic dose of Macrodantin. For the hypothyroidism, the patient will continue her current treatment with levothyroxine. For hypertension she was advised to continue taking her blood pressure medication as prescribed. The patient was advised to call immediately if she has any concerning symptoms in the interval. The patient voices understanding of current disease status and treatment options and is in agreement with the current care plan. All questions were answered. The patient knows  to call the clinic with any problems, questions or concerns. We can certainly see the patient much sooner if necessary.  Disclaimer: This note was dictated with voice recognition software. Similar sounding words can inadvertently be transcribed and may not be corrected upon review.

## 2017-03-05 ENCOUNTER — Telehealth: Payer: Self-pay | Admitting: Internal Medicine

## 2017-03-05 NOTE — Telephone Encounter (Signed)
Scheduled appt per 5/22 los. Patient aware of appts added.

## 2017-03-18 ENCOUNTER — Ambulatory Visit (HOSPITAL_BASED_OUTPATIENT_CLINIC_OR_DEPARTMENT_OTHER): Payer: Medicare Other | Admitting: Internal Medicine

## 2017-03-18 ENCOUNTER — Other Ambulatory Visit (HOSPITAL_BASED_OUTPATIENT_CLINIC_OR_DEPARTMENT_OTHER): Payer: Medicare Other

## 2017-03-18 ENCOUNTER — Telehealth: Payer: Self-pay | Admitting: Internal Medicine

## 2017-03-18 ENCOUNTER — Ambulatory Visit (HOSPITAL_BASED_OUTPATIENT_CLINIC_OR_DEPARTMENT_OTHER): Payer: Medicare Other

## 2017-03-18 ENCOUNTER — Encounter: Payer: Self-pay | Admitting: Internal Medicine

## 2017-03-18 ENCOUNTER — Encounter: Payer: Self-pay | Admitting: Pharmacy Technician

## 2017-03-18 VITALS — BP 117/55 | HR 76 | Temp 97.7°F | Resp 18 | Ht 65.0 in | Wt 135.9 lb

## 2017-03-18 DIAGNOSIS — C3492 Malignant neoplasm of unspecified part of left bronchus or lung: Secondary | ICD-10-CM | POA: Diagnosis not present

## 2017-03-18 DIAGNOSIS — C3491 Malignant neoplasm of unspecified part of right bronchus or lung: Secondary | ICD-10-CM

## 2017-03-18 DIAGNOSIS — E538 Deficiency of other specified B group vitamins: Secondary | ICD-10-CM

## 2017-03-18 DIAGNOSIS — R200 Anesthesia of skin: Secondary | ICD-10-CM

## 2017-03-18 DIAGNOSIS — Z5112 Encounter for antineoplastic immunotherapy: Secondary | ICD-10-CM

## 2017-03-18 LAB — CBC WITH DIFFERENTIAL/PLATELET
BASO%: 1.1 % (ref 0.0–2.0)
BASOS ABS: 0.1 10*3/uL (ref 0.0–0.1)
EOS ABS: 0.4 10*3/uL (ref 0.0–0.5)
EOS%: 4 % (ref 0.0–7.0)
HEMATOCRIT: 39.6 % (ref 34.8–46.6)
HEMOGLOBIN: 13 g/dL (ref 11.6–15.9)
LYMPH%: 24 % (ref 14.0–49.7)
MCH: 29 pg (ref 25.1–34.0)
MCHC: 32.9 g/dL (ref 31.5–36.0)
MCV: 88.2 fL (ref 79.5–101.0)
MONO#: 0.8 10*3/uL (ref 0.1–0.9)
MONO%: 8.1 % (ref 0.0–14.0)
NEUT#: 5.8 10*3/uL (ref 1.5–6.5)
NEUT%: 62.8 % (ref 38.4–76.8)
Platelets: 236 10*3/uL (ref 145–400)
RBC: 4.49 10*6/uL (ref 3.70–5.45)
RDW: 14.8 % — AB (ref 11.2–14.5)
WBC: 9.3 10*3/uL (ref 3.9–10.3)
lymph#: 2.2 10*3/uL (ref 0.9–3.3)

## 2017-03-18 LAB — COMPREHENSIVE METABOLIC PANEL
ALBUMIN: 4.2 g/dL (ref 3.5–5.0)
ALK PHOS: 81 U/L (ref 40–150)
ALT: 8 U/L (ref 0–55)
AST: 11 U/L (ref 5–34)
Anion Gap: 10 mEq/L (ref 3–11)
BUN: 13.9 mg/dL (ref 7.0–26.0)
CALCIUM: 10.4 mg/dL (ref 8.4–10.4)
CHLORIDE: 107 meq/L (ref 98–109)
CO2: 24 mEq/L (ref 22–29)
Creatinine: 1.2 mg/dL — ABNORMAL HIGH (ref 0.6–1.1)
EGFR: 51 mL/min/{1.73_m2} — AB (ref >90)
Glucose: 84 mg/dl (ref 70–140)
POTASSIUM: 4.6 meq/L (ref 3.5–5.1)
Sodium: 141 mEq/L (ref 136–145)
Total Bilirubin: 0.44 mg/dL (ref 0.20–1.20)
Total Protein: 7.8 g/dL (ref 6.4–8.3)

## 2017-03-18 MED ORDER — CYANOCOBALAMIN 1000 MCG/ML IJ SOLN
1000.0000 ug | Freq: Once | INTRAMUSCULAR | Status: AC
  Administered 2017-03-18: 1000 ug via INTRAMUSCULAR

## 2017-03-18 MED ORDER — HEPARIN SOD (PORK) LOCK FLUSH 100 UNIT/ML IV SOLN
500.0000 [IU] | Freq: Once | INTRAVENOUS | Status: AC | PRN
  Administered 2017-03-18: 500 [IU]
  Filled 2017-03-18: qty 5

## 2017-03-18 MED ORDER — CYANOCOBALAMIN 1000 MCG/ML IJ SOLN
INTRAMUSCULAR | Status: AC
  Filled 2017-03-18: qty 1

## 2017-03-18 MED ORDER — SODIUM CHLORIDE 0.9 % IV SOLN
240.0000 mg | Freq: Once | INTRAVENOUS | Status: AC
  Administered 2017-03-18: 240 mg via INTRAVENOUS
  Filled 2017-03-18: qty 24

## 2017-03-18 MED ORDER — SODIUM CHLORIDE 0.9% FLUSH
10.0000 mL | INTRAVENOUS | Status: DC | PRN
  Administered 2017-03-18: 10 mL
  Filled 2017-03-18: qty 10

## 2017-03-18 MED ORDER — SODIUM CHLORIDE 0.9 % IV SOLN
Freq: Once | INTRAVENOUS | Status: AC
  Administered 2017-03-18: 13:00:00 via INTRAVENOUS

## 2017-03-18 NOTE — Telephone Encounter (Signed)
Scheduled appt per 6/5 LOS - Patient went to treatment before scheduling finished - patient to get new schedule in treatment area.

## 2017-03-18 NOTE — Progress Notes (Signed)
[facility] [phone]   Fax:(336) [phone]  OFFICE PROGRESS NOTE  [name], NP (Inactive) [phone] [address]  DIAGNOSIS:  1) recurrent non-small cell lung cancer, adenocarcinoma initially diagnosed as stage IB (T2a, N0, M0) in December 2010. 2) history of acute pulmonary embolism in the right lower lobe lobe are, segmental and subsegmental pulmonary arteries diagnosed in January 2013.  PRIOR THERAPY: 1.Status post right upper lobectomy on September 28, 2009 under the care of Dr. Arlyce Dice. The patient refused adjuvant chemotherapy at that time. She had disease recurrence in June of 2012.  2.Status post 4 cycles of systemic chemotherapy with carboplatin for an AUC of 6 and paclitaxel at 200 mg per meter squared and Avastin at 15 mg/kg according to the ECOG protocol #5508.  3. Chemotherapy with Avastin 15 mg/kg according to the ECOG protocol #5508 status post 32 cycles. Last cycle was given on 06/30/2013 discontinued secondary to disease progression and persistent proteinuria. 4. Tarceva 150 mg by mouth daily, started 08/09/2013, status post 10 months of treatment. 5. Tarceva 100 mg by mouth daily started 07/02/2014, status post 19 months, discontinued secondary to disease progression.  CURRENT THERAPY: Immunotherapy with Nivolumab 240 mg IV every 2 weeks status post 27 cycles. First dose was given 02/20/2016.  INTERVAL HISTORY: [name] 76 y.o. female returns to the clinic today for follow-up visit. The patient is feeling fine today with no specific complaints. She denied having any chest pain, shortness of breath, cough or hemoptysis. She denied having any fever or chills. She has no nausea, vomiting, diarrhea or constipation. She intentionally lost 1 pound since her last visit. She denied having any headache or visual changes. She is tolerating her current treatment with Nivolumab fairly well.   MEDICAL HISTORY: Past  Medical History:  Diagnosis Date  . Bilateral lung cancer ([place]) 03/08/2011   recurrent  . Chronic fatigue 02/14/2016  . Dysuria 03/04/2017  . Encounter for antineoplastic immunotherapy 02/14/2016  . Foot pain 09/02/2011  . Glaucoma   . Hip pain 09/02/2011  . Hypercholesterolemia   . Hypertension   . Hypertension 10/29/2016  . Hypothyroidism   . Lung cancer ([place]) 03/08/2011   recurrent    ALLERGIES:  is allergic to cymbalta [duloxetine hcl]; fish allergy; amitriptyline; codeine; and sulfa antibiotics.  MEDICATIONS:  Current Outpatient Prescriptions  Medication Sig Dispense Refill  . amLODipine (NORVASC) 5 MG tablet Take 10 mg by mouth daily. Per [name], NP    . aspirin 81 MG tablet Take 81 mg by mouth daily.    . clonazePAM (KLONOPIN) 0.5 MG tablet Take 0.5 mg by mouth 2 (two) times daily as needed. Per [name], NP    . cyanocobalamin (,VITAMIN B-12,) 1000 MCG/ML injection Inject 1,000 mcg into the muscle every 30 (thirty) days.    . dorzolamide-timolol (COSOPT) 22.3-6.8 MG/ML ophthalmic solution Place 2 drops into both eyes 2 (two) times daily. Per [name], NP    . fluticasone (FLONASE) 50 MCG/ACT nasal spray Place 1 spray into both nostrils daily as needed for allergies or rhinitis.    Marland Kitchen levothyroxine (SYNTHROID, LEVOTHROID) 88 MCG tablet Take 88 mcg by mouth daily before breakfast.    . lidocaine-prilocaine (EMLA) cream Apply 1 application topically as needed (prior to port access). 30 g 0  . lisinopril (PRINIVIL,ZESTRIL) 40 MG tablet Take 40 mg by mouth daily. Per [name], NP      No current facility-administered medications for this  visit.     SURGICAL HISTORY:  Past Surgical History:  Procedure Laterality Date  . LUNG LOBECTOMY  09/28/2009   right upper  . THORACOTOMY  09/28/2009   mini  . VIDEO ASSISTED THORACOSCOPY  09/28/2009    REVIEW OF SYSTEMS:  A comprehensive review of systems was negative except for: Constitutional: positive for fatigue    PHYSICAL EXAMINATION: General appearance: alert, cooperative, fatigued and no distress Head: Normocephalic, without obvious abnormality, atraumatic Neck: no adenopathy, no JVD, supple, symmetrical, trachea midline and thyroid not enlarged, symmetric, no tenderness/mass/nodules Lymph nodes: Cervical, supraclavicular, and axillary nodes normal. Resp: clear to auscultation bilaterally Back: symmetric, no curvature. ROM normal. No CVA tenderness. Cardio: regular rate and rhythm, S1, S2 normal, no murmur, click, rub or gallop GI: soft, non-tender; bowel sounds normal; no masses,  no organomegaly Extremities: extremities normal, atraumatic, no cyanosis or edema  ECOG PERFORMANCE STATUS: 1 - Symptomatic but completely ambulatory  Blood pressure (!) 117/55, pulse 76, temperature 97.7 F (36.5 C), temperature source Oral, resp. rate 18, height 5\' 5"  (1.651 m), weight 135 lb 14.4 oz (61.6 kg), SpO2 98 %.  LABORATORY DATA: Lab Results  Component Value Date   WBC 9.3 03/18/2017   HGB 13.0 03/18/2017   HCT 39.6 03/18/2017   MCV 88.2 03/18/2017   PLT 236 03/18/2017      Chemistry      Component Value Date/Time   NA 139 03/04/2017 1032   K 4.4 03/04/2017 1032   CL 105 01/11/2015 1603   CL 107 03/30/2013 1036   CO2 24 03/04/2017 1032   BUN 12.4 03/04/2017 1032   CREATININE 1.2 (H) 03/04/2017 1032      Component Value Date/Time   CALCIUM 10.4 03/04/2017 1032   ALKPHOS 79 03/04/2017 1032   AST 12 03/04/2017 1032   ALT 12 03/04/2017 1032   BILITOT 0.44 03/04/2017 1032       RADIOGRAPHIC STUDIES: Ct Chest W Contrast  Result Date: 02/28/2017 CLINICAL DATA:  Followup lung cancer. EXAM: CT CHEST WITH CONTRAST TECHNIQUE: Multidetector CT imaging of the chest was performed during intravenous contrast administration. CONTRAST:  75 cc of Isovue-300 COMPARISON:  12/20/2016 FINDINGS: Cardiovascular: The heart size appears normal. No pericardial effusion identified. Aortic atherosclerosis  identified. Calcification within the LAD, left circumflex and RCA coronary artery noted. Mediastinum/Nodes: The trachea appears patent and is midline. Normal appearance of the esophagus. Index left hilar node measures 1.2 cm, image 60 of series 2. Previously 1.1 cm. Lungs/Pleura: No pleural effusion. Moderate to advanced changes of centrilobular emphysema. Bilateral pleuroparenchymal scarring noted. Status post right upper lobectomy. Lesion within the superior segment of the left lower lobe measures 1.9 x 2.0 cm, image 60 of series 7. Previously 2.2 x 2.1 cm. Left lower lobe perihilar nodule Measures 1.1 cm, image 74 of series 7. Previously 0.9 cm. Subpleural nodule within the medial right lower lobe Measures 2 cm, image 79 of series 7. Previously 1.8 cm. Peripheral nodule within the right lower lobe Measures 1 cm, image 51 of series 7. Previously this measured the same. Subpleural part solid lesion within the right lower lobe measures 2.2 cm, image 81 of series 7. Previously this measured the same. Upper Abdomen: Calcified granulomas identified within the liver and spleen. Small cyst in right lobe of liver is stable. The adrenal glands are unremarkable. Musculoskeletal: No aggressive lytic or sclerotic bone lesions. IMPRESSION: 1. The enlarging index lesion within the left lower lobe continues to increase in size and now measures 11 mm (  previously 9 mm. ). 2. Subpleural nodule within the medial right lower lobe also demonstrates mild increase in size in the interval. 3. Dominant lesion within the superior segment of left lower lobe demonstrates mild decrease in the interval. 4. Stable left hilar adenopathy. 5. Aortic Atherosclerosis (ICD10-I70.0) and Emphysema (ICD10-J43.9). 6. Multi vessel coronary artery calcification. Electronically Signed   By: Kerby Moors M.D.   On: 02/28/2017 14:50    ASSESSMENT AND PLAN:  This is a very pleasant 76 years old African-American female with recurrent non-small cell lung  cancer, adenocarcinoma previously treated with several regimens of chemotherapy and she is currently on treatment with immunotherapy with Nivolumab status post 27 cycles. She has been tolerating this treatment fairly well with no significant adverse effects. I recommended for the patient to continue her treatment as scheduled and she will proceed with cycle #28 today. I will see her back for follow-up visit in 2 weeks for evaluation before the next cycle of her treatment. She was advised to call immediately if she has any concerning symptoms in the interval. The patient voices understanding of current disease status and treatment options and is in agreement with the current care plan. All questions were answered. The patient knows to call the clinic with any problems, questions or concerns. We can certainly see the patient much sooner if necessary. I spent 10 minutes counseling the patient face to face. The total time spent in the appointment was 15 minutes.  Disclaimer: This note was dictated with voice recognition software. Similar sounding words can inadvertently be transcribed and may not be corrected upon review.

## 2017-03-18 NOTE — Patient Instructions (Signed)
[facility] Discharge Instructions for Patients Receiving Chemotherapy  Today you received the following chemotherapy agents:  Nivolumab.  To help prevent nausea and vomiting after your treatment, we encourage you to take your nausea medication as directed.   If you develop nausea and vomiting that is not controlled by your nausea medication, call the clinic.   BELOW ARE SYMPTOMS THAT SHOULD BE REPORTED IMMEDIATELY:  *FEVER GREATER THAN 100.5 F  *CHILLS WITH OR WITHOUT FEVER  NAUSEA AND VOMITING THAT IS NOT CONTROLLED WITH YOUR NAUSEA MEDICATION  *UNUSUAL SHORTNESS OF BREATH  *UNUSUAL BRUISING OR BLEEDING  TENDERNESS IN MOUTH AND THROAT WITH OR WITHOUT PRESENCE OF ULCERS  *URINARY PROBLEMS  *BOWEL PROBLEMS  UNUSUAL RASH Items with * indicate a potential emergency and should be followed up as soon as possible.  Feel free to call the clinic you have any questions or concerns. The clinic phone number is [phone].  Please show the CHEMO ALERT CARD at check-in to the Emergency Department and triage nurse.   

## 2017-03-18 NOTE — Progress Notes (Addendum)
[employer] has high OOP and is patient is approved for drug assistance by [employer] for Opdivo.

## 2017-03-27 ENCOUNTER — Telehealth: Payer: Self-pay | Admitting: *Deleted

## 2017-03-27 NOTE — Telephone Encounter (Signed)
This is Gwen with [facility] calling to obtain the next scheduled Opdivo infusion dates, conform dose and your address to ensure medicine is delivered on time."  Informed Gwen patient continues to receive 240 mg Opdivo scheduled every 14 days due 04-01-2017 and 04-15-2017.  No further questions.

## 2017-04-01 ENCOUNTER — Other Ambulatory Visit: Payer: Medicare Other

## 2017-04-01 ENCOUNTER — Telehealth: Payer: Self-pay | Admitting: Medical Oncology

## 2017-04-01 ENCOUNTER — Ambulatory Visit: Payer: Medicare Other

## 2017-04-01 ENCOUNTER — Ambulatory Visit: Payer: Medicare Other | Admitting: Internal Medicine

## 2017-04-01 NOTE — Telephone Encounter (Signed)
Pt has back pain and UTI ,on macrodantin- .She cancelled her appts for today. She will call back when she feels better. I told her to keep her July appts.

## 2017-04-15 ENCOUNTER — Other Ambulatory Visit (HOSPITAL_BASED_OUTPATIENT_CLINIC_OR_DEPARTMENT_OTHER): Payer: Medicare Other

## 2017-04-15 ENCOUNTER — Encounter: Payer: Self-pay | Admitting: Internal Medicine

## 2017-04-15 ENCOUNTER — Telehealth: Payer: Self-pay | Admitting: Internal Medicine

## 2017-04-15 ENCOUNTER — Ambulatory Visit (HOSPITAL_BASED_OUTPATIENT_CLINIC_OR_DEPARTMENT_OTHER): Payer: Medicare Other | Admitting: Internal Medicine

## 2017-04-15 ENCOUNTER — Ambulatory Visit (HOSPITAL_BASED_OUTPATIENT_CLINIC_OR_DEPARTMENT_OTHER): Payer: Medicare Other

## 2017-04-15 VITALS — BP 103/52 | HR 81 | Temp 97.0°F | Resp 18 | Ht 65.0 in | Wt 129.9 lb

## 2017-04-15 DIAGNOSIS — R5382 Chronic fatigue, unspecified: Secondary | ICD-10-CM | POA: Diagnosis not present

## 2017-04-15 DIAGNOSIS — C3491 Malignant neoplasm of unspecified part of right bronchus or lung: Secondary | ICD-10-CM

## 2017-04-15 DIAGNOSIS — Z5112 Encounter for antineoplastic immunotherapy: Secondary | ICD-10-CM

## 2017-04-15 DIAGNOSIS — Z79899 Other long term (current) drug therapy: Secondary | ICD-10-CM | POA: Diagnosis not present

## 2017-04-15 DIAGNOSIS — E538 Deficiency of other specified B group vitamins: Secondary | ICD-10-CM | POA: Diagnosis not present

## 2017-04-15 DIAGNOSIS — C3492 Malignant neoplasm of unspecified part of left bronchus or lung: Secondary | ICD-10-CM

## 2017-04-15 DIAGNOSIS — M545 Low back pain: Secondary | ICD-10-CM | POA: Diagnosis not present

## 2017-04-15 DIAGNOSIS — Z86711 Personal history of pulmonary embolism: Secondary | ICD-10-CM | POA: Diagnosis not present

## 2017-04-15 DIAGNOSIS — R200 Anesthesia of skin: Secondary | ICD-10-CM

## 2017-04-15 LAB — COMPREHENSIVE METABOLIC PANEL
ALT: 9 U/L (ref 0–55)
ANION GAP: 10 meq/L (ref 3–11)
AST: 12 U/L (ref 5–34)
Albumin: 4.1 g/dL (ref 3.5–5.0)
Alkaline Phosphatase: 80 U/L (ref 40–150)
BILIRUBIN TOTAL: 0.43 mg/dL (ref 0.20–1.20)
BUN: 27.6 mg/dL — ABNORMAL HIGH (ref 7.0–26.0)
CHLORIDE: 107 meq/L (ref 98–109)
CO2: 24 meq/L (ref 22–29)
Calcium: 10.7 mg/dL — ABNORMAL HIGH (ref 8.4–10.4)
Creatinine: 1.4 mg/dL — ABNORMAL HIGH (ref 0.6–1.1)
EGFR: 41 mL/min/{1.73_m2} — AB (ref >90)
Glucose: 87 mg/dl (ref 70–140)
POTASSIUM: 4.4 meq/L (ref 3.5–5.1)
Sodium: 141 mEq/L (ref 136–145)
Total Protein: 7.8 g/dL (ref 6.4–8.3)

## 2017-04-15 LAB — CBC WITH DIFFERENTIAL/PLATELET
BASO%: 0.5 % (ref 0.0–2.0)
Basophils Absolute: 0.1 10*3/uL (ref 0.0–0.1)
EOS ABS: 0.3 10*3/uL (ref 0.0–0.5)
EOS%: 3.2 % (ref 0.0–7.0)
HCT: 37.5 % (ref 34.8–46.6)
HGB: 12.4 g/dL (ref 11.6–15.9)
LYMPH%: 18.7 % (ref 14.0–49.7)
MCH: 29.4 pg (ref 25.1–34.0)
MCHC: 33.1 g/dL (ref 31.5–36.0)
MCV: 88.9 fL (ref 79.5–101.0)
MONO#: 0.8 10*3/uL (ref 0.1–0.9)
MONO%: 7.5 % (ref 0.0–14.0)
NEUT#: 7.3 10*3/uL — ABNORMAL HIGH (ref 1.5–6.5)
NEUT%: 70.1 % (ref 38.4–76.8)
PLATELETS: 235 10*3/uL (ref 145–400)
RBC: 4.22 10*6/uL (ref 3.70–5.45)
RDW: 14.4 % (ref 11.2–14.5)
WBC: 10.4 10*3/uL — ABNORMAL HIGH (ref 3.9–10.3)
lymph#: 2 10*3/uL (ref 0.9–3.3)

## 2017-04-15 LAB — TSH: TSH: 0.163 m(IU)/L — ABNORMAL LOW (ref 0.308–3.960)

## 2017-04-15 MED ORDER — SODIUM CHLORIDE 0.9% FLUSH
10.0000 mL | INTRAVENOUS | Status: DC | PRN
  Administered 2017-04-15: 10 mL
  Filled 2017-04-15: qty 10

## 2017-04-15 MED ORDER — SODIUM CHLORIDE 0.9 % IV SOLN
Freq: Once | INTRAVENOUS | Status: AC
  Administered 2017-04-15: 14:00:00 via INTRAVENOUS

## 2017-04-15 MED ORDER — CYANOCOBALAMIN 1000 MCG/ML IJ SOLN
1000.0000 ug | Freq: Once | INTRAMUSCULAR | Status: AC
  Administered 2017-04-15: 1000 ug via INTRAMUSCULAR

## 2017-04-15 MED ORDER — HEPARIN SOD (PORK) LOCK FLUSH 100 UNIT/ML IV SOLN
500.0000 [IU] | Freq: Once | INTRAVENOUS | Status: AC | PRN
  Administered 2017-04-15: 500 [IU]
  Filled 2017-04-15: qty 5

## 2017-04-15 MED ORDER — SODIUM CHLORIDE 0.9 % IV SOLN
240.0000 mg | Freq: Once | INTRAVENOUS | Status: AC
  Administered 2017-04-15: 240 mg via INTRAVENOUS
  Filled 2017-04-15: qty 20

## 2017-04-15 MED ORDER — CYANOCOBALAMIN 1000 MCG/ML IJ SOLN
INTRAMUSCULAR | Status: AC
  Filled 2017-04-15: qty 1

## 2017-04-15 NOTE — Progress Notes (Signed)
[name] [phone]   [phone]  OFFICE PROGRESS NOTE  [name], [name] [phone] [place] [address]  DIAGNOSIS:  1) recurrent non-small cell lung cancer, adenocarcinoma initially diagnosed as stage IB (T2a, N0, M0) in December 2010. 2) history of acute pulmonary embolism in the right lower lobe lobe are, segmental and subsegmental pulmonary arteries diagnosed in January 2013.  PRIOR THERAPY: 1.Status post right upper lobectomy on September 28, 2009 under the care of Dr. Arlyce Dice. The patient refused adjuvant chemotherapy at that time. She had disease recurrence in June of 2012.  2.Status post 4 cycles of systemic chemotherapy with carboplatin for an AUC of 6 and paclitaxel at 200 mg per meter squared and Avastin at 15 mg/kg according to the ECOG protocol #5508.  3. Chemotherapy with Avastin 15 mg/kg according to the ECOG protocol #5508 status post 32 cycles. Last cycle was given on 06/30/2013 discontinued secondary to disease progression and persistent proteinuria. 4. Tarceva 150 mg by mouth daily, started 08/09/2013, status post 10 months of treatment. 5. Tarceva 100 mg by mouth daily started 07/02/2014, status post 19 months, discontinued secondary to disease progression.  CURRENT THERAPY: Immunotherapy with Nivolumab 240 mg IV every 2 weeks status post 29 cycles. First dose was given 02/20/2016.  INTERVAL HISTORY: [name] 76 y.o. female returns to the clinic today for follow-up visit. The patient is feeling fine today was no specific complaints except for low back pain with radiation to the legs. This is likely secondary to arthritis. She denied having any chest pain, shortness of breath, cough or hemoptysis. She has no weight loss or night sweats. She has no fever or chills. She has been tolerating her treatment with Nivolumab fairly well. She is here for evaluation before starting cycle #30.   MEDICAL HISTORY: Past  Medical History:  Diagnosis Date  . Bilateral lung cancer ([place]) 03/08/2011   recurrent  . Chronic fatigue 02/14/2016  . Dysuria 03/04/2017  . Encounter for antineoplastic immunotherapy 02/14/2016  . Foot pain 09/02/2011  . Glaucoma   . Hip pain 09/02/2011  . Hypercholesterolemia   . Hypertension   . Hypertension 10/29/2016  . Hypothyroidism   . Lung cancer ([place]) 03/08/2011   recurrent    ALLERGIES:  is allergic to cymbalta [duloxetine hcl]; fish allergy; amitriptyline; codeine; and sulfa antibiotics.  MEDICATIONS:  Current Outpatient Prescriptions  Medication Sig Dispense Refill  . amLODipine (NORVASC) 5 MG tablet Take 10 mg by mouth daily. Per [name], [name]    . aspirin 81 MG tablet Take 81 mg by mouth daily.    . clonazePAM (KLONOPIN) 0.5 MG tablet Take 0.5 mg by mouth 2 (two) times daily as needed. Per [name], [name]    . cyanocobalamin (,VITAMIN B-12,) 1000 MCG/ML injection Inject 1,000 mcg into the muscle every 30 (thirty) days.    . dorzolamide-timolol (COSOPT) 22.3-6.8 MG/ML ophthalmic solution Place 2 drops into both eyes 2 (two) times daily. Per [name], [name]    . fluticasone (FLONASE) 50 MCG/ACT nasal spray Place 1 spray into both nostrils daily as needed for allergies or rhinitis.    Marland Kitchen levothyroxine (SYNTHROID, LEVOTHROID) 88 MCG tablet Take 88 mcg by mouth daily before breakfast.    . lidocaine-prilocaine (EMLA) cream Apply 1 application topically as needed (prior to port access). 30 g 0  . lisinopril (PRINIVIL,ZESTRIL) 40 MG tablet Take 40 mg by mouth daily. Per [name], [name]     .  nitrofurantoin (MACRODANTIN) 100 MG capsule Take 100 mg by mouth daily.     No current facility-administered medications for this visit.     SURGICAL HISTORY:  Past Surgical History:  Procedure Laterality Date  . LUNG LOBECTOMY  09/28/2009   right upper  . THORACOTOMY  09/28/2009   mini  . VIDEO ASSISTED THORACOSCOPY  09/28/2009    REVIEW OF SYSTEMS:  A comprehensive  review of systems was negative except for: Constitutional: positive for fatigue Musculoskeletal: positive for back pain   PHYSICAL EXAMINATION: General appearance: alert, cooperative, fatigued and no distress Head: Normocephalic, without obvious abnormality, atraumatic Neck: no adenopathy, no JVD, supple, symmetrical, trachea midline and thyroid not enlarged, symmetric, no tenderness/mass/nodules Lymph nodes: Cervical, supraclavicular, and axillary nodes normal. Resp: clear to auscultation bilaterally Back: symmetric, no curvature. ROM normal. No CVA tenderness. Cardio: regular rate and rhythm, S1, S2 normal, no murmur, click, rub or gallop GI: soft, non-tender; bowel sounds normal; no masses,  no organomegaly Extremities: extremities normal, atraumatic, no cyanosis or edema  ECOG PERFORMANCE STATUS: 1 - Symptomatic but completely ambulatory  Blood pressure (!) 103/52, pulse 81, temperature (!) 97 F (36.1 C), temperature source Oral, resp. rate 18, height 5\' 5"  (1.651 m), weight 129 lb 14.4 oz (58.9 kg), SpO2 100 %.  LABORATORY DATA: Lab Results  Component Value Date   WBC 10.4 (H) 04/15/2017   HGB 12.4 04/15/2017   HCT 37.5 04/15/2017   MCV 88.9 04/15/2017   PLT 235 04/15/2017      Chemistry      Component Value Date/Time   NA 141 04/15/2017 1148   K 4.4 04/15/2017 1148   CL 105 01/11/2015 1603   CL 107 03/30/2013 1036   CO2 24 04/15/2017 1148   BUN 27.6 (H) 04/15/2017 1148   CREATININE 1.4 (H) 04/15/2017 1148      Component Value Date/Time   CALCIUM 10.7 (H) 04/15/2017 1148   ALKPHOS 80 04/15/2017 1148   AST 12 04/15/2017 1148   ALT 9 04/15/2017 1148   BILITOT 0.43 04/15/2017 1148       RADIOGRAPHIC STUDIES: No results found.  ASSESSMENT AND PLAN:  This is a very pleasant 76 years old African-American female with recurrent non-small cell lung cancer, adenocarcinoma status post several chemotherapy regimens and currently on treatment with immunotherapy with  Nivolumab status post 29 cycles. The patient has been tolerating her treatment well. I recommended for her to proceed with cycle #30 today as scheduled. For the back pain, the patient was advised to take her pain medication and as needed small doses of ibuprofen for the inflammatory process in the back. I will see her back for follow-up visit in 2 weeks for evaluation before the next dose of her treatment. She was advised to call immediately if she has any concerning symptoms in the interval. The patient voices understanding of current disease status and treatment options and is in agreement with the current care plan. All questions were answered. The patient knows to call the clinic with any problems, questions or concerns. We can certainly see the patient much sooner if necessary. I spent 10 minutes counseling the patient face to face. The total time spent in the appointment was 15 minutes.  Disclaimer: This note was dictated with voice recognition software. Similar sounding words can inadvertently be transcribed and may not be corrected upon review.

## 2017-04-15 NOTE — Patient Instructions (Signed)
[facility] Discharge Instructions for Patients Receiving Chemotherapy  Today you received the following chemotherapy agents Nivolumab  To help prevent nausea and vomiting after your treatment, we encourage you to take your nausea medication     If you develop nausea and vomiting that is not controlled by your nausea medication, call the clinic.   BELOW ARE SYMPTOMS THAT SHOULD BE REPORTED IMMEDIATELY:  *FEVER GREATER THAN 100.5 F  *CHILLS WITH OR WITHOUT FEVER  NAUSEA AND VOMITING THAT IS NOT CONTROLLED WITH YOUR NAUSEA MEDICATION  *UNUSUAL SHORTNESS OF BREATH  *UNUSUAL BRUISING OR BLEEDING  TENDERNESS IN MOUTH AND THROAT WITH OR WITHOUT PRESENCE OF ULCERS  *URINARY PROBLEMS  *BOWEL PROBLEMS  UNUSUAL RASH Items with * indicate a potential emergency and should be followed up as soon as possible.  Feel free to call the clinic you have any questions or concerns. The clinic phone number is [phone].  Please show the CHEMO ALERT CARD at check-in to the Emergency Department and triage nurse.   

## 2017-04-15 NOTE — Telephone Encounter (Signed)
Scheduled appt per 7/3 los - patient to get updates schedule next visit.

## 2017-04-23 ENCOUNTER — Telehealth: Payer: Self-pay | Admitting: Internal Medicine

## 2017-04-23 DIAGNOSIS — N390 Urinary tract infection, site not specified: Secondary | ICD-10-CM | POA: Insufficient documentation

## 2017-04-23 DIAGNOSIS — N398 Other specified disorders of urinary system: Secondary | ICD-10-CM | POA: Insufficient documentation

## 2017-04-23 DIAGNOSIS — N952 Postmenopausal atrophic vaginitis: Secondary | ICD-10-CM | POA: Insufficient documentation

## 2017-04-23 DIAGNOSIS — R159 Full incontinence of feces: Secondary | ICD-10-CM | POA: Insufficient documentation

## 2017-04-23 NOTE — Telephone Encounter (Signed)
lvm about appointment changes and a call back number

## 2017-04-27 NOTE — Progress Notes (Signed)
[facility] Follow up:    [name], NP [phone] [place] [address]   DIAGNOSIS: Cancer Staging Bilateral lung cancer ([place]) Staging form: Lung, AJCC 7th Edition - Clinical: Stage IV (T1b, NX, M1b, Free text: metastatic non-small cell lung cancer, adenocarcinoma) - Signed by Curt Bears, MD on 02/15/2014   SUMMARY OF ONCOLOGIC HISTORY: Per Dr. Worthy Flank last note:   1.Status post right upper lobectomy on September 28, 2009 under the care of Dr. Arlyce Dice. The patient refused adjuvant chemotherapy at that time. She had disease recurrence in June of 2012.   2.Status post 4 cycles of systemic chemotherapy with carboplatin for an AUC of 6 and paclitaxel at 200 mg per meter squared and Avastin at 15 mg/kg according to the ECOG protocol #5508.   3. Chemotherapy with Avastin 15 mg/kg according to the ECOG protocol #5508 status post 32 cycles. Last cycle was given on 06/30/2013 discontinued secondary to disease progression and persistent proteinuria.  4. Tarceva 150 mg by mouth daily, started 08/09/2013, status post 10 months of treatment.  5. Tarceva 100 mg by mouth daily started 07/02/2014, status post 19 months, discontinued secondary to disease progression.  CURRENT THERAPY: Immunotherapy with Nivolumab 240mg  IV every 2 weeks s/p 29 cycles.  First dose was given on 02/20/2016.  INTERVAL HISTORY: LAKENDRA HELLING 76 y.o. female returns for evaluation prior to receiving Nivolumab.  She has longstanding h/o UTI and she is followed  By urology.  She does have upcoming tests with urology soon including an ultrasound.  She is c/o some lower back pain x 1 month and she is taking Tylenol for this.  This was attributed to her bladder issues, however no imaging has been done to eval spine.     Patient Active Problem List   Diagnosis Date Noted  . Dysuria 03/04/2017  . Hypothyroidism (acquired) 10/29/2016  . Hypertension 10/29/2016  . Encounter for  antineoplastic immunotherapy 02/14/2016  . Chronic fatigue 02/14/2016  . Port catheter in place 02/09/2016  . Urine frequency 07/06/2014  . Diarrhea 05/24/2014  . Nausea and vomiting 05/24/2014  . Sepsis ([place]) 05/24/2014  . Hypothermia 05/24/2014  . Hypokalemia 05/24/2014  . Nausea vomiting and diarrhea 05/24/2014  . Proteinuria 04/28/2012  . Vitamin B12 deficiency 03/17/2012  . Unspecified deficiency anemia 12/02/2011  . 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 Multinodular goiter 11/29/2011  . Numbness 11/29/2011  . Acute pulmonary embolism ([place] [facility]) 11/04/2011  . Acute pulmonary embolism ([place]) 11/04/2011  . Hip pain 09/02/2011  . Foot pain 09/02/2011  . Bilateral lung cancer ([place]) 03/08/2011    is allergic to cymbalta [duloxetine hcl]; fish allergy; amitriptyline; codeine; and sulfa antibiotics.  MEDICAL HISTORY: Past Medical History:  Diagnosis Date  . Bilateral lung cancer ([facility]) 03/08/2011   recurrent  . Chronic fatigue 02/14/2016  . Dysuria 03/04/2017  . Encounter for antineoplastic immunotherapy 02/14/2016  . Foot pain 09/02/2011  . Glaucoma   . Hip pain 09/02/2011  . Hypercholesterolemia   . Hypertension   . Hypertension 10/29/2016  . Hypothyroidism   . Lung cancer ([place] [facility]) 03/08/2011   recurrent    SURGICAL HISTORY: Past Surgical History:  Procedure Laterality Date  . LUNG LOBECTOMY  09/28/2009   right upper  . THORACOTOMY  09/28/2009   mini  . VIDEO ASSISTED THORACOSCOPY  09/28/2009    SOCIAL HISTORY: Social History   Social History  . Marital status: Widowed    Spouse name: N/A  . Number of children: N/A  .  Years of education: N/A   Occupational History  . Not on file.   Social History Main Topics  . Smoking status: Former Smoker    Packs/day: 1.00    Years: 50.00    Types: Cigarettes    Quit date: 09/28/2009  . Smokeless tobacco: Never Used  . Alcohol use No  . Drug use: No  . Sexual activity: No   Other Topics Concern  . Not on file    Social History Narrative  . No narrative on file    FAMILY HISTORY: Non contributory  Review of Systems  Constitutional: Negative for appetite change, chills, fatigue, fever and unexpected weight change.  HENT:   Negative for hearing loss and lump/mass.   Eyes: Negative for eye problems and icterus.  Respiratory: Negative for chest tightness, cough and shortness of breath.   Cardiovascular: Negative for chest pain, leg swelling and palpitations.  Gastrointestinal: Negative for abdominal distention, abdominal pain, constipation, diarrhea, nausea and vomiting.  Genitourinary: Negative for difficulty urinating and pelvic pain.   Musculoskeletal: Positive for back pain.  Skin: Negative for itching and rash.  Neurological: Positive for numbness (chronic and unchanged). Negative for dizziness, extremity weakness and speech difficulty.  Hematological: Negative for adenopathy. Does not bruise/bleed easily.  Psychiatric/Behavioral: Negative for depression. The patient is not nervous/anxious.       PHYSICAL EXAMINATION  ECOG PERFORMANCE STATUS: 1 - Symptomatic but completely ambulatory  Vitals:   04/28/17 1020  BP: (!) 142/65  Pulse: 86  Resp: 18  Temp: 97.7 F (36.5 C)    Physical Exam  Constitutional: She is oriented to person, place, and time and well-developed, well-nourished, and in no distress.  HENT:  Head: Normocephalic and atraumatic.  Mouth/Throat: Oropharynx is clear and moist. No oropharyngeal exudate.  Eyes: Pupils are equal, round, and reactive to light. No scleral icterus.  Neck: Neck supple.  Cardiovascular: Normal rate, regular rhythm, normal heart sounds and intact distal pulses.   Pulmonary/Chest: Effort normal and breath sounds normal. No respiratory distress. She has no wheezes.  Abdominal: Soft. Bowel sounds are normal.  Musculoskeletal: She exhibits no edema.  NO focal spinal TTP  Lymphadenopathy:    She has no cervical adenopathy.  Neurological: She  is alert and oriented to person, place, and time.  Skin: Skin is warm and dry.  Psychiatric: Mood and affect normal.    LABORATORY DATA:  CBC    Component Value Date/Time   WBC 11.5 (H) 04/28/2017 1000   WBC 12.2 (H) 01/11/2015 1544   RBC 4.19 04/28/2017 1000   RBC 4.35 01/11/2015 1544   HGB 12.2 04/28/2017 1000   HCT 36.9 04/28/2017 1000   PLT 276 04/28/2017 1000   MCV 88.0 04/28/2017 1000   MCH 29.2 04/28/2017 1000   MCH 28.5 01/11/2015 1544   MCHC 33.2 04/28/2017 1000   MCHC 31.7 01/11/2015 1544   RDW 14.9 (H) 04/28/2017 1000   LYMPHSABS 1.8 04/28/2017 1000   MONOABS 0.8 04/28/2017 1000   EOSABS 0.3 04/28/2017 1000   BASOSABS 0.1 04/28/2017 1000    CMP     Component Value Date/Time   NA 142 04/28/2017 1000   K 4.0 04/28/2017 1000   CL 105 01/11/2015 1603   CL 107 03/30/2013 1036   CO2 23 04/28/2017 1000   GLUCOSE 99 04/28/2017 1000   GLUCOSE 83 03/30/2013 1036   BUN 15.2 04/28/2017 1000   CREATININE 1.2 (H) 04/28/2017 1000   CALCIUM 10.9 (H) 04/28/2017 1000   PROT 7.9 04/28/2017  1000   ALBUMIN 4.1 04/28/2017 1000   AST 16 04/28/2017 1000   ALT 15 04/28/2017 1000   ALKPHOS 79 04/28/2017 1000   BILITOT 0.39 04/28/2017 1000   GFRNONAA 46 (L) 05/25/2014 0332   GFRAA 53 (L) 05/25/2014 0332       ASSESSMENT and PLAN:   Bilateral lung cancer ([facility]) Jersie is doing well today.  She will proceed with treatment with Nivolumab.  I reviewed her status and labs with Dr. Julien Nordmann in detail.  I ordered plain films of her spine today, and I also ordered CT chest with contrast to be done prior to her next appointment which is 05/13/2017.  I reviewed this with her in detail and she will take tylenol or advil for her back pain.  It may be related to her urologic issues however, and she may need to follow up with them to better manage her pain.     Orders Placed This Encounter  Procedures  . DG Thoracic Spine 2 View    Standing Status:   Future    Number of Occurrences:    1    Standing Expiration Date:   04/28/2018    Order Specific Question:   Reason for Exam (SYMPTOM  OR DIAGNOSIS REQUIRED)    Answer:   back pain, metastatic lung cancer    Order Specific Question:   Preferred imaging location?    Answer:   [facility]    Order Specific Question:   Radiology Contrast Protocol - do NOT remove file path    Answer:   \\[email or web address]s.pdf  . DG Lumbar Spine 2-3 Views    Standing Status:   Future    Number of Occurrences:   1    Standing Expiration Date:   04/28/2018    Order Specific Question:   Reason for Exam (SYMPTOM  OR DIAGNOSIS REQUIRED)    Answer:   back pain, metastatic lung cancer    Order Specific Question:   Preferred imaging location?    Answer:   [facility]    Order Specific Question:   Radiology Contrast Protocol - do NOT remove file path    Answer:   \\[email or web address]s.pdf  . CT Chest W Contrast    Standing Status:   Future    Standing Expiration Date:   04/28/2018    Order Specific Question:   If indicated for the ordered procedure, I authorize the administration of contrast media per Radiology protocol    Answer:   Yes    Order Specific Question:   Reason for Exam (SYMPTOM  OR DIAGNOSIS REQUIRED)    Answer:   metastatic lung cancer restaging    Order Specific Question:   Preferred imaging location?    Answer:   [facility]    Order Specific Question:   Radiology Contrast Protocol - do NOT remove file path    Answer:   \\[email or web address].pdf    All questions were answered. The patient knows to call the clinic with any problems, questions or concerns. We can certainly see the patient much sooner if necessary.  A total of (30) minutes of face-to-face time was spent with this patient with greater than 50% of that time in counseling and care-coordination.  This note was electronically signed. Scot Dock,  NP 04/28/2017

## 2017-04-28 ENCOUNTER — Ambulatory Visit (HOSPITAL_COMMUNITY)
Admission: RE | Admit: 2017-04-28 | Discharge: 2017-04-28 | Disposition: A | Discharge status: Home / Self Care | Payer: Medicare Other | Source: Ambulatory Visit | Attending: Adult Health | Admitting: Adult Health

## 2017-04-28 ENCOUNTER — Ambulatory Visit (HOSPITAL_BASED_OUTPATIENT_CLINIC_OR_DEPARTMENT_OTHER): Payer: Medicare Other

## 2017-04-28 ENCOUNTER — Ambulatory Visit (HOSPITAL_BASED_OUTPATIENT_CLINIC_OR_DEPARTMENT_OTHER): Payer: Medicare Other | Admitting: Adult Health

## 2017-04-28 ENCOUNTER — Other Ambulatory Visit (HOSPITAL_BASED_OUTPATIENT_CLINIC_OR_DEPARTMENT_OTHER): Payer: Medicare Other

## 2017-04-28 ENCOUNTER — Encounter: Payer: Self-pay | Admitting: Adult Health

## 2017-04-28 VITALS — BP 142/65 | HR 86 | Temp 97.7°F | Resp 18 | Ht 65.0 in | Wt 129.8 lb

## 2017-04-28 DIAGNOSIS — M419 Scoliosis, unspecified: Secondary | ICD-10-CM | POA: Diagnosis not present

## 2017-04-28 DIAGNOSIS — Z5112 Encounter for antineoplastic immunotherapy: Secondary | ICD-10-CM | POA: Diagnosis not present

## 2017-04-28 DIAGNOSIS — C3492 Malignant neoplasm of unspecified part of left bronchus or lung: Secondary | ICD-10-CM

## 2017-04-28 DIAGNOSIS — M545 Low back pain, unspecified: Secondary | ICD-10-CM

## 2017-04-28 DIAGNOSIS — I7090 Unspecified atherosclerosis: Secondary | ICD-10-CM | POA: Insufficient documentation

## 2017-04-28 DIAGNOSIS — C3491 Malignant neoplasm of unspecified part of right bronchus or lung: Secondary | ICD-10-CM

## 2017-04-28 DIAGNOSIS — M47816 Spondylosis without myelopathy or radiculopathy, lumbar region: Secondary | ICD-10-CM | POA: Insufficient documentation

## 2017-04-28 LAB — COMPREHENSIVE METABOLIC PANEL
ALT: 15 U/L (ref 0–55)
ANION GAP: 10 meq/L (ref 3–11)
AST: 16 U/L (ref 5–34)
Albumin: 4.1 g/dL (ref 3.5–5.0)
Alkaline Phosphatase: 79 U/L (ref 40–150)
BILIRUBIN TOTAL: 0.39 mg/dL (ref 0.20–1.20)
BUN: 15.2 mg/dL (ref 7.0–26.0)
CHLORIDE: 109 meq/L (ref 98–109)
CO2: 23 meq/L (ref 22–29)
CREATININE: 1.2 mg/dL — AB (ref 0.6–1.1)
Calcium: 10.9 mg/dL — ABNORMAL HIGH (ref 8.4–10.4)
EGFR: 52 mL/min/{1.73_m2} — ABNORMAL LOW (ref >90)
GLUCOSE: 99 mg/dL (ref 70–140)
Potassium: 4 mEq/L (ref 3.5–5.1)
SODIUM: 142 meq/L (ref 136–145)
TOTAL PROTEIN: 7.9 g/dL (ref 6.4–8.3)

## 2017-04-28 LAB — CBC WITH DIFFERENTIAL/PLATELET
BASO%: 0.9 % (ref 0.0–2.0)
Basophils Absolute: 0.1 10*3/uL (ref 0.0–0.1)
EOS%: 2.5 % (ref 0.0–7.0)
Eosinophils Absolute: 0.3 10*3/uL (ref 0.0–0.5)
HCT: 36.9 % (ref 34.8–46.6)
HGB: 12.2 g/dL (ref 11.6–15.9)
LYMPH%: 16 % (ref 14.0–49.7)
MCH: 29.2 pg (ref 25.1–34.0)
MCHC: 33.2 g/dL (ref 31.5–36.0)
MCV: 88 fL (ref 79.5–101.0)
MONO#: 0.8 10*3/uL (ref 0.1–0.9)
MONO%: 6.8 % (ref 0.0–14.0)
NEUT%: 73.8 % (ref 38.4–76.8)
NEUTROS ABS: 8.5 10*3/uL — AB (ref 1.5–6.5)
Platelets: 276 10*3/uL (ref 145–400)
RBC: 4.19 10*6/uL (ref 3.70–5.45)
RDW: 14.9 % — ABNORMAL HIGH (ref 11.2–14.5)
WBC: 11.5 10*3/uL — AB (ref 3.9–10.3)
lymph#: 1.8 10*3/uL (ref 0.9–3.3)

## 2017-04-28 MED ORDER — SODIUM CHLORIDE 0.9 % IV SOLN
Freq: Once | INTRAVENOUS | Status: AC
  Administered 2017-04-28: 11:00:00 via INTRAVENOUS

## 2017-04-28 MED ORDER — HEPARIN SOD (PORK) LOCK FLUSH 100 UNIT/ML IV SOLN
500.0000 [IU] | Freq: Once | INTRAVENOUS | Status: AC | PRN
  Administered 2017-04-28: 500 [IU]
  Filled 2017-04-28: qty 5

## 2017-04-28 MED ORDER — SODIUM CHLORIDE 0.9 % IV SOLN
240.0000 mg | Freq: Once | INTRAVENOUS | Status: AC
  Administered 2017-04-28: 240 mg via INTRAVENOUS
  Filled 2017-04-28: qty 24

## 2017-04-28 MED ORDER — SODIUM CHLORIDE 0.9% FLUSH
10.0000 mL | INTRAVENOUS | Status: DC | PRN
  Administered 2017-04-28: 10 mL
  Filled 2017-04-28: qty 10

## 2017-04-28 NOTE — Assessment & Plan Note (Signed)
[name] is doing well today.  She will proceed with treatment with Nivolumab.  I reviewed her status and labs with Dr. Julien Nordmann in detail.  I ordered plain films of her spine today, and I also ordered CT chest with contrast to be done prior to her next appointment which is 05/13/2017.  I reviewed this with her in detail and she will take tylenol or advil for her back pain.  It may be related to her urologic issues however, and she may need to follow up with them to better manage her pain.

## 2017-04-28 NOTE — Patient Instructions (Signed)
[facility] Discharge Instructions for Patients Receiving Chemotherapy  Today you received the following chemotherapy agents:  Nivolumab.  To help prevent nausea and vomiting after your treatment, we encourage you to take your nausea medication as directed.   If you develop nausea and vomiting that is not controlled by your nausea medication, call the clinic.   BELOW ARE SYMPTOMS THAT SHOULD BE REPORTED IMMEDIATELY:  *FEVER GREATER THAN 100.5 F  *CHILLS WITH OR WITHOUT FEVER  NAUSEA AND VOMITING THAT IS NOT CONTROLLED WITH YOUR NAUSEA MEDICATION  *UNUSUAL SHORTNESS OF BREATH  *UNUSUAL BRUISING OR BLEEDING  TENDERNESS IN MOUTH AND THROAT WITH OR WITHOUT PRESENCE OF ULCERS  *URINARY PROBLEMS  *BOWEL PROBLEMS  UNUSUAL RASH Items with * indicate a potential emergency and should be followed up as soon as possible.  Feel free to call the clinic you have any questions or concerns. The clinic phone number is [phone].  Please show the CHEMO ALERT CARD at check-in to the Emergency Department and triage nurse.   

## 2017-04-29 ENCOUNTER — Ambulatory Visit: Payer: Medicare Other | Admitting: Internal Medicine

## 2017-04-29 ENCOUNTER — Other Ambulatory Visit: Payer: Medicare Other

## 2017-04-29 ENCOUNTER — Ambulatory Visit: Payer: Medicare Other

## 2017-04-30 ENCOUNTER — Telehealth: Payer: Self-pay | Admitting: Medical Oncology

## 2017-04-30 NOTE — Telephone Encounter (Signed)
Pt.notified

## 2017-04-30 NOTE — Telephone Encounter (Signed)
-----   Message from Gardenia Phlegm, NP sent at 04/30/2017  3:24 PM EDT ----- Regarding: RE: xray results Normal.  She should follow up with urology about her issues, likely coming from her bladder.   ----- Message ----- From: Ardeen Garland, RN Sent: 04/30/2017   3:13 PM To: Gardenia Phlegm, NP Subject: xray results                                   She called asking about xray results

## 2017-05-03 ENCOUNTER — Telehealth: Payer: Self-pay

## 2017-05-03 NOTE — Telephone Encounter (Signed)
Spoke with patient and she is aware of [name]

## 2017-05-08 ENCOUNTER — Ambulatory Visit (HOSPITAL_COMMUNITY): Payer: Medicare Other

## 2017-05-08 ENCOUNTER — Other Ambulatory Visit: Payer: Self-pay | Admitting: Medical Oncology

## 2017-05-10 ENCOUNTER — Ambulatory Visit (INDEPENDENT_AMBULATORY_CARE_PROVIDER_SITE_OTHER): Payer: Medicare Other | Admitting: Physician Assistant

## 2017-05-10 ENCOUNTER — Encounter: Payer: Self-pay | Admitting: Physician Assistant

## 2017-05-10 VITALS — BP 116/66 | HR 98 | Temp 97.9°F | Resp 17 | Ht 64.0 in | Wt 123.0 lb

## 2017-05-10 DIAGNOSIS — M5441 Lumbago with sciatica, right side: Secondary | ICD-10-CM

## 2017-05-10 DIAGNOSIS — M5137 Other intervertebral disc degeneration, lumbosacral region: Secondary | ICD-10-CM

## 2017-05-10 DIAGNOSIS — C3492 Malignant neoplasm of unspecified part of left bronchus or lung: Secondary | ICD-10-CM | POA: Diagnosis not present

## 2017-05-10 DIAGNOSIS — M5442 Lumbago with sciatica, left side: Secondary | ICD-10-CM

## 2017-05-10 DIAGNOSIS — C3491 Malignant neoplasm of unspecified part of right bronchus or lung: Secondary | ICD-10-CM

## 2017-05-10 MED ORDER — MELOXICAM 7.5 MG PO TABS: 7.5000 mg | ORAL_TABLET | Freq: Every day | ORAL | 0 refills | Status: DC

## 2017-05-10 NOTE — Progress Notes (Signed)
Patient ID: [name], female     DOB: May 15, 1941, 76 y.o.    MRN: [id number]  PCP: Harrison Mons, PA-C  Chief Complaint  Patient presents with  . New Patient (Initial Visit)    C/O bilateral leg pain (right leg worse), & back pain x 5mth. [place] today going to the mailbox due to leg weakness.    Subjective:   This patient is new to me and presents for evaluation of Bilateral leg pain. She also desires to establish for primary care.  Her medical history is notable for stage IV adenocarcinoma of the lung, active, with 2 months of bilateral leg pain. This pain began in the low back and has radiated to the hips and legs. She rates the pain 7-8/10 in the legs, 4-5/10 in the low back. The pain prevents her from sleeping and is relieved with 1300 mg of acetaminophen, which also makes her sleepy. Today the pain was so severe that she fell going to her mailbox (she relates a very long driveway. A neighbor saw her and came to help her up. She sustained some minor scrapes to the knees, bt no significant injuries. No recent falls other than this one. She has a cane, but doesn't use it at home, preferring to "furniture surf."   She reported this pain at her visit with oncology on 04/28/17. Radiographs of the back at that time revealed normal thoracic spine, multilevel degenerative changes of the lower lumbar spine, scoliosis and atherosclerosis. Creatinine was mildly elevated at 1.2.  .  Review of Systems Constitutional: Positive for chills, malaise/fatigue and weight loss (In the last months lost 20lbs). Negative for fever.  Eyes: Negative for blurred vision and double vision.  Respiratory: Negative for cough and wheezing.   Cardiovascular: Negative for chest pain.  Genitourinary: Negative for dysuria and frequency.  Musculoskeletal: Positive for back pain, falls, joint pain and myalgias.  Neurological: Positive for tingling (in feet and finger tips. Attributes to chemo), sensory change  (plantar surfaces of bilateral feet), focal weakness (weakness in legs) and weakness.  Psychiatric/Behavioral: Positive for depression (Feels hopeless because she lives by herself. Her children said they would not help take care of her). Negative for suicidal ideas.    Prior to Admission medications   Medication Sig Start Date End Date Taking? Authorizing Provider  amLODipine (NORVASC) 5 MG tablet Take 10 mg by mouth daily. Per [name], NP   Yes [provider]  aspirin 81 MG tablet Take 81 mg by mouth daily.   Yes [provider]  clonazePAM (KLONOPIN) 0.5 MG tablet Take 0.5 mg by mouth 2 (two) times daily as needed. Per [name], NP   Yes [provider]  cyanocobalamin (,VITAMIN B-12,) 1000 MCG/ML injection Inject 1,000 mcg into the muscle every 30 (thirty) days.   Yes [provider]  dorzolamide-timolol (COSOPT) 22.3-6.8 MG/ML ophthalmic solution Place 2 drops into both eyes 2 (two) times daily. Per [name], NP   Yes [provider]  fluticasone (FLONASE) 50 MCG/ACT nasal spray Place 1 spray into both nostrils daily as needed for allergies or rhinitis.   Yes [provider]  levothyroxine (SYNTHROID, LEVOTHROID) 88 MCG tablet Take 88 mcg by mouth daily before breakfast.   Yes [provider]  lidocaine-prilocaine (EMLA) cream Apply 1 application topically as needed (prior to port access). 03/05/16  Yes Curt Bears, MD  lisinopril (PRINIVIL,ZESTRIL) 40 MG tablet Take 40 mg by mouth daily. Per [name], NP  Yes [provider]  nitrofurantoin (MACRODANTIN) 100 MG capsule Take 100 mg by mouth daily. 03/31/17  Yes [provider]  cephALEXin (KEFLEX) 250 MG capsule TAKE 2 CAPSULES TWICE A DAY FOR 1 WEEK, THEN TAKE ONE CAPSULE EVERY DAY 04/23/17   [provider]  PREMARIN vaginal cream INSERT BLUEBERRY SIZE AMOUNT OF ESTROGEN CREAM INTO VAGINA TWICE WEEKLY. 04/25/17   [provider]     Allergies  Allergen Reactions  . Cymbalta [Duloxetine Hcl] Other (See Comments)    Involuntary movements " turned me completely around"  . Fish Allergy Diarrhea  . Amitriptyline     Initial reaction about 25 years ago.  . Codeine Nausea And Vomiting    vomiting  . Sulfa Antibiotics Nausea And Vomiting     Patient Active Problem List   Diagnosis Date Noted  . Dysuria 03/04/2017  . Hypothyroidism (acquired) 10/29/2016  . Hypertension 10/29/2016  . Encounter for antineoplastic immunotherapy 02/14/2016  . Chronic fatigue 02/14/2016  . Port catheter in place 02/09/2016  . Urine frequency 07/06/2014  . Diarrhea 05/24/2014  . Nausea and vomiting 05/24/2014  . Sepsis ([place]) 05/24/2014  . Hypothermia 05/24/2014  . Hypokalemia 05/24/2014  . Nausea vomiting and diarrhea 05/24/2014  . Proteinuria 04/28/2012  . Vitamin B12 deficiency 03/17/2012  . Unspecified deficiency anemia 12/02/2011  . 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 Multinodular goiter 11/29/2011  . Numbness 11/29/2011  . Acute pulmonary embolism ([place]) 11/04/2011  . Acute pulmonary embolism ([place]) 11/04/2011  . Hip pain 09/02/2011  . Foot pain 09/02/2011  . Bilateral lung cancer ([place]) 03/08/2011     Family History  Problem Relation Age of Onset  . Stroke Father   . Heart disease Father 39       MI     Social History   Social History  . Marital status: Widowed    Spouse name: N/A  . Number of children: N/A  . Years of education: N/A   Occupational History  . Retired    Social History Main Topics  . Smoking status: Former Smoker    Packs/day: 1.00    Years: 50.00    Types: Cigarettes    Quit date: 09/28/2009  . Smokeless tobacco: Never Used  . Alcohol use No  . Drug use: No  . Sexual activity: No   Other Topics Concern  . Not on file   Social History Narrative   Lives alone with her dog. Daughters live in [place] and [place], [place].         Objective:    Physical Exam  Constitutional: She is oriented to person, place, and time. She appears well-developed and well-nourished. She is active and cooperative. No distress.  BP 116/66   Pulse 98   Temp 97.9 F (36.6 C) (Oral)   Resp 17   Ht 5\' 4"  (1.626 m)   Wt 123 lb (55.8 kg)   SpO2 98%   BMI 21.11 kg/m   HENT:  Head: Normocephalic and atraumatic.  Right Ear: Hearing normal.  Left Ear: Hearing normal.  Eyes: Conjunctivae are normal. No scleral icterus.  Neck: Normal range of motion. Neck supple. No thyromegaly present.  Cardiovascular: Normal rate, regular rhythm and normal heart sounds.   Pulses:      Radial pulses are 2+ on the right side, and 2+ on the left side.  Pulmonary/Chest: Effort normal and breath sounds normal.  Musculoskeletal:       Right hip: Normal.  Left hip: Normal.       Cervical back: Normal.       Thoracic back: Normal.       Lumbar back: Normal.       Right upper leg: Normal.       Left upper leg: Normal.  Lymphadenopathy:       Head (right side): No tonsillar, no preauricular, no posterior auricular and no occipital adenopathy present.       Head (left side): No tonsillar, no preauricular, no posterior auricular and no occipital adenopathy present.    She has no cervical adenopathy.       Right: No supraclavicular adenopathy present.       Left: No supraclavicular adenopathy present.  Neurological: She is alert and oriented to person, place, and time. She has normal strength. No sensory deficit.  Reflex Scores:      Patellar reflexes are 2+ on the right side and 2+ on the left side.      Achilles reflexes are 2+ on the right side and 2+ on the left side. Skin: Skin is warm, dry and intact. No rash noted. No cyanosis or erythema. Nails show no clubbing.  Psychiatric: She has a normal mood and affect. Her speech is normal and behavior is normal.       Assessment & Plan:   Problem List Items Addressed This Visit    Bilateral lung cancer ([place])     Continue follow-up/treatment per oncology.      Relevant Medications   cephALEXin (KEFLEX) 250 MG capsule   DDD (degenerative disc disease), lumbosacral    Likely the cause of her pain. Trial of NSAIDS. If no improvement, would consider MRI of the L-spine due to radicular symptoms.      Relevant Medications   meloxicam (MOBIC) 7.5 MG tablet   Other Relevant Orders   Ambulatory referral to Physical Therapy    Other Visit Diagnoses    Acute bilateral low back pain with bilateral sciatica    -  Primary   No evidence of metastatic disease of the lumbar spine on recent radiographs. Trial of meloxicam and PT.   Relevant Medications   meloxicam (MOBIC) 7.5 MG tablet       Return in about 4 weeks (around 06/07/2017) for Re-evaluation of back pain and blood pressure.   Fara Chute, PA-C [facility]

## 2017-05-10 NOTE — Patient Instructions (Addendum)
1. Try the meloxicam to help with leg and back pain. 2. Take your cane or walker with you everywhere. 3. PT office will contact you directly to schedule an appointment to start working on strength and balance exercises to keep you independent.      IF you received an x-ray today, you will receive an invoice from [facility]. Please contact [facility] at [phone] with questions or concerns regarding your invoice.   IF you received labwork today, you will receive an invoice from [facility]. Please contact [facility] at [phone] with questions or concerns regarding your invoice.   Our billing staff will not be able to assist you with questions regarding bills from these companies.  You will be contacted with the lab results as soon as they are available. The fastest way to get your results is to activate your My Chart account. Instructions are located on the last page of this paperwork. If you have not heard from [place] regarding the results in 2 weeks, please contact this office.

## 2017-05-10 NOTE — Progress Notes (Signed)
Chief Complaint  Patient presents with  . New Patient (Initial Visit)    C/O bilateral leg pain (right leg worse), & back pain x 1mth. [place] today going to the mailbox due to leg weakness.   HPI [name] is a 76 yo old female with a significant past medical history of active stage IV adenocarcinoma lung cancer who presents to the clinic today with a chief complaint of bilateral leg and back pain.  The pain started 2 months ago and first presented in the lower back, but has now progressed into the hips and thighs. She describes the pain a constant in her back, hips, and legs that is non-radiating. Today the pain in her right thigh is a 7-8/10, while her low back pain is a 4-5/10. The pain is keeping her up at night. She has taken 1300mg  tylenol arthritis every 8 hours, which she admits relieves the pain, but also makes her drowsy. The patient is attributing the pain to arthritis, but admits she has never been formally diagnosed.  The patient also admits to falling the morning of her visit. She was without her cane walking to the mailbox, felt weak and fell to the ground. The patient could not get up by herself, reporting she was too weak, and was helped to her feet by a neighbor. She reports that she is fine with minor scraps on her knees. The patient denies falling in the past three months, but does feel unstable in her home and uses furniture to move about the house. She admits to not using her cane inside the house.   [name] does not currently have a PCP, and would like to establish one today.   [name] is currently receiving chemotherapy at the [facility]. On 04/28/2017, the care team of the cancer center ordered lumbar spine Xrays. The findings are as follows: Five non rib-bearing lumbar type vertebral bodies are present. Degenerative retrolisthesis is present at L3-4 and L4-5. Moderate facet hypertrophy is present at both of these levels. Marked endplate degenerative  changes are present at L3-4. Right lateral listhesis is present at L2-3. Levoconvex curvature centered at L3. Rightward curvature is centered at T12. Uterine fibroid is present. Vascular calcifications are noted without aneurysm. The impression was the patient has scoliosis and multilevel degenerative changes in the lower lumbar spine. The patient was unaware of these findings when the results were discussed during this visit.   The patient had a CMP on 04/28/2017. Her BUN was 15.2mg /dl and her Cr was 1.2mg /dl.   Review of Systems  Constitutional: Positive for chills, malaise/fatigue and weight loss (In the last months lost 20lbs). Negative for fever.  Eyes: Negative for blurred vision and double vision.  Respiratory: Negative for cough and wheezing.   Cardiovascular: Negative for chest pain.  Genitourinary: Negative for dysuria and frequency.  Musculoskeletal: Positive for back pain, falls, joint pain and myalgias.  Neurological: Positive for tingling (in feet and finger tips. Attributes to chemo), sensory change (plantar surfaces of bilateral feet), focal weakness (weakness in legs) and weakness.  Psychiatric/Behavioral: Positive for depression (Feels hopeless because she lives by herself. Her children said they would not help take care of her). Negative for suicidal ideas.   Patient Active Problem List   Diagnosis Date Noted  . Dysuria 03/04/2017  . Hypothyroidism (acquired) 10/29/2016  . Hypertension 10/29/2016  . Encounter for antineoplastic immunotherapy 02/14/2016  . Chronic fatigue 02/14/2016  . Port catheter in place 02/09/2016  . Urine frequency  07/06/2014  . Diarrhea 05/24/2014  . Nausea and vomiting 05/24/2014  . Sepsis ([place]) 05/24/2014  . Hypothermia 05/24/2014  . Hypokalemia 05/24/2014  . Nausea vomiting and diarrhea 05/24/2014  . Proteinuria 04/28/2012  . Vitamin B12 deficiency 03/17/2012  . Unspecified deficiency anemia 12/02/2011  . Other postablative  hypothyroidism 11/29/2011  . Multinodular goiter 11/29/2011  . Numbness 11/29/2011  . Acute pulmonary embolism ([place]) 11/04/2011  . Acute pulmonary embolism ([place]) 11/04/2011  . Hip pain 09/02/2011  . Foot pain 09/02/2011  . Bilateral lung cancer ([place]) 03/08/2011   Allergies  Allergen Reactions  . Cymbalta [Duloxetine Hcl] Other (See Comments)    Involuntary movements " turned me completely around"  . Fish Allergy Diarrhea  . Amitriptyline     Initial reaction about 25 years ago.  . Codeine Nausea And Vomiting    vomiting  . Sulfa Antibiotics Nausea And Vomiting   Prior to Admission medications   Medication Sig Start Date End Date Taking? Authorizing Provider  amLODipine (NORVASC) 5 MG tablet Take 10 mg by mouth daily. Per [name], NP   Yes [provider]  aspirin 81 MG tablet Take 81 mg by mouth daily.   Yes [provider]  clonazePAM (KLONOPIN) 0.5 MG tablet Take 0.5 mg by mouth 2 (two) times daily as needed. Per [name], NP   Yes [provider]  cyanocobalamin (,VITAMIN B-12,) 1000 MCG/ML injection Inject 1,000 mcg into the muscle every 30 (thirty) days.   Yes [provider]  dorzolamide-timolol (COSOPT) 22.3-6.8 MG/ML ophthalmic solution Place 2 drops into both eyes 2 (two) times daily. Per [name], NP   Yes [provider]  fluticasone (FLONASE) 50 MCG/ACT nasal spray Place 1 spray into both nostrils daily as needed for allergies or rhinitis.   Yes [provider]  levothyroxine (SYNTHROID, LEVOTHROID) 88 MCG tablet Take 88 mcg by mouth daily before breakfast.   Yes [provider]  lidocaine-prilocaine (EMLA) cream Apply 1 application topically as needed (prior to port access). 03/05/16  Yes Curt Bears, MD  lisinopril (PRINIVIL,ZESTRIL) 40 MG tablet Take 40 mg by mouth daily. Per [name], NP    Yes [provider]  nitrofurantoin (MACRODANTIN) 100 MG capsule Take 100 mg by mouth  daily. 03/31/17  Yes [provider]  cephALEXin (KEFLEX) 250 MG capsule TAKE 2 CAPSULES TWICE A DAY FOR 1 WEEK, THEN TAKE ONE CAPSULE EVERY DAY 04/23/17   [provider]  meloxicam (MOBIC) 7.5 MG tablet Take 1 tablet (7.5 mg total) by mouth daily. 05/10/17   Harrison Mons, PA-C  PREMARIN vaginal cream INSERT BLUEBERRY SIZE AMOUNT OF ESTROGEN CREAM INTO VAGINA TWICE WEEKLY. 04/25/17   [provider]     Vital Signs BP 116/66   Pulse 98   Temp 97.9 F (36.6 C) (Oral)   Resp 17   Ht 5\' 4"  (1.626 m)   Wt 123 lb (55.8 kg)   SpO2 98%   BMI 21.11 kg/m    Physical Exam  Constitutional: She appears distressed.  Patient was cooperative, but emotional throughout the visit   Cardiovascular: Normal rate and regular rhythm.   Musculoskeletal:       Lumbar back: She exhibits no tenderness and no pain.  Hip abduction/adduction 5/5 bilaterally. Right hip flexion 5/5. Left hip flexion 4/5.  Negative straight leg raise bilaterally.  Patient was unstable when walking without her cane from the chair to the exam table.  Lymphadenopathy:    She has no cervical  adenopathy.   Assessment and Plan 1. Acute bilateral low back pain with bilateral sciatica  2. DDD (degenerative disc disease), lumbosacral Prescribed meoxicom to patient and explained this medication will give her greater relief than the tylenol she was previously taking. Would like patient to start physical therapy to gain strength and stability. Encouraged patient to take cane everywhere. Return to office in 4 weeks to acess back pain and blood pressure - meloxicam (MOBIC) 7.5 MG tablet; Take 1 tablet (7.5 mg total) by mouth daily.  Dispense: 30 tablet; Refill: 0 - Ambulatory referral to Physical Therapy  3. Bilateral lung cancer ([place])

## 2017-05-12 ENCOUNTER — Encounter (HOSPITAL_COMMUNITY): Payer: Self-pay

## 2017-05-12 ENCOUNTER — Ambulatory Visit (HOSPITAL_COMMUNITY)
Admission: RE | Admit: 2017-05-12 | Discharge: 2017-05-12 | Disposition: A | Discharge status: Home / Self Care | Payer: Medicare Other | Source: Ambulatory Visit | Attending: Adult Health | Admitting: Adult Health

## 2017-05-12 DIAGNOSIS — R918 Other nonspecific abnormal finding of lung field: Secondary | ICD-10-CM | POA: Insufficient documentation

## 2017-05-12 DIAGNOSIS — J439 Emphysema, unspecified: Secondary | ICD-10-CM | POA: Insufficient documentation

## 2017-05-12 DIAGNOSIS — I700 Atherosclerosis of aorta: Secondary | ICD-10-CM | POA: Diagnosis not present

## 2017-05-12 DIAGNOSIS — R591 Generalized enlarged lymph nodes: Secondary | ICD-10-CM | POA: Insufficient documentation

## 2017-05-12 DIAGNOSIS — C787 Secondary malignant neoplasm of liver and intrahepatic bile duct: Secondary | ICD-10-CM | POA: Diagnosis not present

## 2017-05-12 DIAGNOSIS — C3492 Malignant neoplasm of unspecified part of left bronchus or lung: Secondary | ICD-10-CM | POA: Insufficient documentation

## 2017-05-12 DIAGNOSIS — I2510 Atherosclerotic heart disease of native coronary artery without angina pectoris: Secondary | ICD-10-CM | POA: Insufficient documentation

## 2017-05-12 DIAGNOSIS — C3491 Malignant neoplasm of unspecified part of right bronchus or lung: Secondary | ICD-10-CM | POA: Insufficient documentation

## 2017-05-12 MED ORDER — IOPAMIDOL (ISOVUE-300) INJECTION 61%
75.0000 mL | Freq: Once | INTRAVENOUS | Status: AC | PRN
  Administered 2017-05-12: 75 mL via INTRAVENOUS

## 2017-05-12 MED ORDER — IOPAMIDOL (ISOVUE-300) INJECTION 61%
INTRAVENOUS | Status: AC
  Filled 2017-05-12: qty 75

## 2017-05-13 ENCOUNTER — Encounter: Payer: Self-pay | Admitting: Oncology

## 2017-05-13 ENCOUNTER — Other Ambulatory Visit (HOSPITAL_BASED_OUTPATIENT_CLINIC_OR_DEPARTMENT_OTHER): Payer: Medicare Other

## 2017-05-13 ENCOUNTER — Ambulatory Visit (HOSPITAL_BASED_OUTPATIENT_CLINIC_OR_DEPARTMENT_OTHER): Payer: Medicare Other | Admitting: Oncology

## 2017-05-13 ENCOUNTER — Ambulatory Visit (HOSPITAL_BASED_OUTPATIENT_CLINIC_OR_DEPARTMENT_OTHER): Payer: Medicare Other

## 2017-05-13 VITALS — BP 121/65 | HR 79 | Temp 97.8°F | Resp 18 | Ht 64.0 in | Wt 128.3 lb

## 2017-05-13 DIAGNOSIS — C3491 Malignant neoplasm of unspecified part of right bronchus or lung: Secondary | ICD-10-CM

## 2017-05-13 DIAGNOSIS — C3492 Malignant neoplasm of unspecified part of left bronchus or lung: Secondary | ICD-10-CM

## 2017-05-13 DIAGNOSIS — M549 Dorsalgia, unspecified: Secondary | ICD-10-CM

## 2017-05-13 DIAGNOSIS — Z5112 Encounter for antineoplastic immunotherapy: Secondary | ICD-10-CM | POA: Diagnosis not present

## 2017-05-13 LAB — CBC WITH DIFFERENTIAL/PLATELET
BASO%: 0.2 % (ref 0.0–2.0)
Basophils Absolute: 0 10*3/uL (ref 0.0–0.1)
EOS%: 2.9 % (ref 0.0–7.0)
Eosinophils Absolute: 0.2 10*3/uL (ref 0.0–0.5)
HEMATOCRIT: 35.4 % (ref 34.8–46.6)
HGB: 11.6 g/dL (ref 11.6–15.9)
LYMPH#: 1.9 10*3/uL (ref 0.9–3.3)
LYMPH%: 23.4 % (ref 14.0–49.7)
MCH: 29.5 pg (ref 25.1–34.0)
MCHC: 32.8 g/dL (ref 31.5–36.0)
MCV: 90.1 fL (ref 79.5–101.0)
MONO#: 0.5 10*3/uL (ref 0.1–0.9)
MONO%: 6.1 % (ref 0.0–14.0)
NEUT#: 5.5 10*3/uL (ref 1.5–6.5)
NEUT%: 67.4 % (ref 38.4–76.8)
PLATELETS: 262 10*3/uL (ref 145–400)
RBC: 3.93 10*6/uL (ref 3.70–5.45)
RDW: 15.6 % — ABNORMAL HIGH (ref 11.2–14.5)
WBC: 8.2 10*3/uL (ref 3.9–10.3)

## 2017-05-13 LAB — COMPREHENSIVE METABOLIC PANEL
ALT: 16 U/L (ref 0–55)
ANION GAP: 10 meq/L (ref 3–11)
AST: 14 U/L (ref 5–34)
Albumin: 4.1 g/dL (ref 3.5–5.0)
Alkaline Phosphatase: 76 U/L (ref 40–150)
BUN: 11.3 mg/dL (ref 7.0–26.0)
CALCIUM: 10.3 mg/dL (ref 8.4–10.4)
CHLORIDE: 104 meq/L (ref 98–109)
CO2: 23 meq/L (ref 22–29)
CREATININE: 1.1 mg/dL (ref 0.6–1.1)
EGFR: 56 mL/min/{1.73_m2} — AB (ref >90)
Glucose: 104 mg/dl (ref 70–140)
Potassium: 3.9 mEq/L (ref 3.5–5.1)
SODIUM: 138 meq/L (ref 136–145)
TOTAL PROTEIN: 7.6 g/dL (ref 6.4–8.3)
Total Bilirubin: 0.43 mg/dL (ref 0.20–1.20)

## 2017-05-13 MED ORDER — SODIUM CHLORIDE 0.9% FLUSH
10.0000 mL | INTRAVENOUS | Status: DC | PRN
  Administered 2017-05-13: 10 mL
  Filled 2017-05-13: qty 10

## 2017-05-13 MED ORDER — HEPARIN SOD (PORK) LOCK FLUSH 100 UNIT/ML IV SOLN
500.0000 [IU] | Freq: Once | INTRAVENOUS | Status: AC | PRN
  Administered 2017-05-13: 500 [IU]
  Filled 2017-05-13: qty 5

## 2017-05-13 MED ORDER — SODIUM CHLORIDE 0.9 % IV SOLN
240.0000 mg | Freq: Once | INTRAVENOUS | Status: AC
  Administered 2017-05-13: 240 mg via INTRAVENOUS
  Filled 2017-05-13: qty 24

## 2017-05-13 MED ORDER — SODIUM CHLORIDE 0.9 % IV SOLN
Freq: Once | INTRAVENOUS | Status: AC
  Administered 2017-05-13: 13:00:00 via INTRAVENOUS

## 2017-05-13 NOTE — Progress Notes (Signed)
[facility] Follow up:    Terri Mons, PA-C [name] [address]   DIAGNOSIS: Cancer Staging Bilateral lung cancer ([place]) Staging form: Lung, AJCC 7th Edition - Clinical: Stage IV (T1b, NX, M1b, Free text: metastatic non-small cell lung cancer, adenocarcinoma) - Signed by Curt Bears, MD on 02/15/2014   SUMMARY OF ONCOLOGIC HISTORY: Per Dr. Worthy Flank last note:   1.Status post right upper lobectomy on September 28, 2009 under the care of Dr. Arlyce Dice. The patient refused adjuvant chemotherapy at that time. She had disease recurrence in June of 2012.   2.Status post 4 cycles of systemic chemotherapy with carboplatin for an AUC of 6 and paclitaxel at 200 mg per meter squared and Avastin at 15 mg/kg according to the ECOG protocol #5508.   3. Chemotherapy with Avastin 15 mg/kg according to the ECOG protocol #5508 status post 32 cycles. Last cycle was given on 06/30/2013 discontinued secondary to disease progression and persistent proteinuria.  4. Tarceva 150 mg by mouth daily, started 08/09/2013, status post 10 months of treatment.  5. Tarceva 100 mg by mouth daily started 07/02/2014, status post 19 months, discontinued secondary to disease progression.  CURRENT THERAPY: Immunotherapy with Nivolumab 240mg  IV every 2 weeks s/p 30 cycles.  First dose was given on 02/20/2016.  INTERVAL HISTORY: [name] 76 y.o. female returns for evaluation prior to receiving Nivolumab.  She is tolerating her Nivolumab well. The patient had x-rays of her back following her last visit due to back pain. Lumbar spine x-ray was significant for degenerative changes. Thoracic spine x-ray was normal. She is continue to have back pain and some difficulty walking. She was seen this past weekend by her new primary care provider who started her on meloxicam and referred her to physical therapy. She has not yet started physical therapy. Patient is otherwise feeling well. She  denies fevers and chills. Denies chest pain, shortness of breath, cough, hemoptysis. Denies diarrhea. Denies abdominal pain, nausea, and vomiting. The patient is here for evaluation prior cycles 31 of her Nivolumab and to review restaging CT scan results.   Patient Active Problem List   Diagnosis Date Noted  . Dysuria 03/04/2017  . Hypothyroidism (acquired) 10/29/2016  . Hypertension 10/29/2016  . Encounter for antineoplastic immunotherapy 02/14/2016  . Chronic fatigue 02/14/2016  . Port catheter in place 02/09/2016  . Urine frequency 07/06/2014  . Diarrhea 05/24/2014  . Nausea and vomiting 05/24/2014  . Sepsis ([place]) 05/24/2014  . Hypothermia 05/24/2014  . Hypokalemia 05/24/2014  . Nausea vomiting and diarrhea 05/24/2014  . Proteinuria 04/28/2012  . Vitamin B12 deficiency 03/17/2012  . Unspecified deficiency anemia 12/02/2011  . 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 Multinodular goiter 11/29/2011  . Numbness 11/29/2011  . Acute pulmonary embolism ([place]) 11/04/2011  . Acute pulmonary embolism ([place]) 11/04/2011  . Hip pain 09/02/2011  . Foot pain 09/02/2011  . Bilateral lung cancer ([place]) 03/08/2011    is allergic to cymbalta [duloxetine hcl]; fish allergy; amitriptyline; codeine; and sulfa antibiotics.  MEDICAL HISTORY: Past Medical History:  Diagnosis Date  . Bilateral lung cancer ([place]) 03/08/2011   recurrent  . Chronic fatigue 02/14/2016  . Dysuria 03/04/2017  . Encounter for antineoplastic immunotherapy 02/14/2016  . Foot pain 09/02/2011  . Glaucoma   . Hip pain 09/02/2011  . Hypercholesterolemia   . Hypertension   . Hypertension 10/29/2016  . Hypothyroidism   . Lung cancer ([place]) 03/08/2011   recurrent    SURGICAL HISTORY: Past Surgical History:  Procedure Laterality Date  . LUNG LOBECTOMY  09/28/2009   right upper  . THORACOTOMY  09/28/2009   mini  . VIDEO ASSISTED THORACOSCOPY  09/28/2009    SOCIAL HISTORY: Social History   Social History  .  Marital status: Widowed    Spouse name: N/A  . Number of children: N/A  . Years of education: N/A   Occupational History  . Retired    Social History Main Topics  . Smoking status: Former Smoker    Packs/day: 1.00    Years: 50.00    Types: Cigarettes    Quit date: 09/28/2009  . Smokeless tobacco: Never Used  . Alcohol use No  . Drug use: No  . Sexual activity: No   Other Topics Concern  . Not on file   Social History Narrative   Lives alone with her dog. Daughters live in [place] and [place], [place].    FAMILY HISTORY: Non contributory  Review of Systems  Constitutional: Negative for appetite change, chills, fatigue, fever and unexpected weight change.  HENT:   Negative for hearing loss and lump/mass.   Eyes: Negative for eye problems and icterus.  Respiratory: Negative for chest tightness, cough and shortness of breath.   Cardiovascular: Negative for chest pain, leg swelling and palpitations.  Gastrointestinal: Negative for abdominal distention, abdominal pain, constipation, diarrhea, nausea and vomiting.  Genitourinary: Negative for difficulty urinating and pelvic pain.   Musculoskeletal: Positive for back pain.  Skin: Negative for itching and rash.  Neurological: Positive for numbness (chronic and unchanged). Negative for dizziness, extremity weakness and speech difficulty.  Hematological: Negative for adenopathy. Does not bruise/bleed easily.  Psychiatric/Behavioral: Negative for depression. The patient is not nervous/anxious.       PHYSICAL EXAMINATION  ECOG PERFORMANCE STATUS: 1 - Symptomatic but completely ambulatory  Vitals:   05/13/17 1117  BP: 121/65  Pulse: 79  Resp: 18  Temp: 97.8 F (36.6 C)    Physical Exam  Constitutional: She is oriented to person, place, and time and well-developed, well-nourished, and in no distress.  HENT:  Head: Normocephalic and atraumatic.  Mouth/Throat: Oropharynx is clear and moist. No oropharyngeal exudate.   Eyes: Pupils are equal, round, and reactive to light. No scleral icterus.  Neck: Neck supple.  Cardiovascular: Normal rate, regular rhythm, normal heart sounds and intact distal pulses.   Pulmonary/Chest: Effort normal and breath sounds normal. No respiratory distress. She has no wheezes.  Abdominal: Soft. Bowel sounds are normal.  Musculoskeletal: She exhibits no edema.  NO focal spinal TTP  Lymphadenopathy:    She has no cervical adenopathy.  Neurological: She is alert and oriented to person, place, and time.  Skin: Skin is warm and dry.  Psychiatric: Mood and affect normal.    LABORATORY DATA:  CBC    Component Value Date/Time   WBC 8.2 05/13/2017 1058   WBC 12.2 (H) 01/11/2015 1544   RBC 3.93 05/13/2017 1058   RBC 4.35 01/11/2015 1544   HGB 11.6 05/13/2017 1058   HCT 35.4 05/13/2017 1058   PLT 262 05/13/2017 1058   MCV 90.1 05/13/2017 1058   MCH 29.5 05/13/2017 1058   MCH 28.5 01/11/2015 1544   MCHC 32.8 05/13/2017 1058   MCHC 31.7 01/11/2015 1544   RDW 15.6 (H) 05/13/2017 1058   LYMPHSABS 1.9 05/13/2017 1058   MONOABS 0.5 05/13/2017 1058   EOSABS 0.2 05/13/2017 1058   BASOSABS 0.0 05/13/2017 1058    CMP     Component Value Date/Time   NA 138  05/13/2017 1058   K 3.9 05/13/2017 1058   CL 105 01/11/2015 1603   CL 107 03/30/2013 1036   CO2 23 05/13/2017 1058   GLUCOSE 104 05/13/2017 1058   GLUCOSE 83 03/30/2013 1036   BUN 11.3 05/13/2017 1058   CREATININE 1.1 05/13/2017 1058   CALCIUM 10.3 05/13/2017 1058   PROT 7.6 05/13/2017 1058   ALBUMIN 4.1 05/13/2017 1058   AST 14 05/13/2017 1058   ALT 16 05/13/2017 1058   ALKPHOS 76 05/13/2017 1058   BILITOT 0.43 05/13/2017 1058   GFRNONAA 46 (L) 05/25/2014 0332   GFRAA 53 (L) 05/25/2014 0332   .EXAM: CT CHEST WITH CONTRAST  TECHNIQUE: Multidetector CT imaging of the chest was performed during intravenous contrast administration.  CONTRAST:  38mL ISOVUE-300 IOPAMIDOL (ISOVUE-300) INJECTION  61%  COMPARISON:  02/28/2017  FINDINGS: Cardiovascular: No acute findings. Aortic and coronary artery atherosclerosis.  Mediastinum/Nodes: Stable mild left hilar lymphadenopathy measuring 12 mm on image 60/2 . No new or increased areas of lymphadenopathy identified.  Lungs/Pleura: Stable moderate emphysema. Stable postop changes from right upper lobectomy and bilateral parenchymal scarring, right side greater than left.  Spiculated nodule superior left lower lobe measures 1.9 x 1.9 cm on image 59/7 compared to 1.9 x 2.0 cm previously. Central left lower lobe pulmonary nodule in the infrahilar region measures 12 mm on image 74/7, compared to 11 mm previously. Subpleural nodule in the medial left lower lobe measures 2.0 cm on image 81/7, compared to 2.0 cm previously. Part solid nodule in the posteromedial right lower lobe measures 2.2 cm on image 83/7 compared to 2.2 cm previously. 1.0 cm solid nodule in right lower lobe on image 49/7 5 compared to 1.0 cm previously. These nodules show no significant change in size. No new or enlarging pulmonary nodules or masses identified. No evidence of pleural effusion .  Upper Abdomen: Normal adrenal glands. Multiple small hypervascular metastases are seen visualized portion of the liver. These appear to have progressed since previous studies, however this may at least in part be due to differences in contrast bolus timing. A 7 mm lymph node in the gastrohepatic ligament on image 131/2 shows no significant change.  Musculoskeletal:  No suspicious bone lesions.  IMPRESSION: No significant change in multiple bilateral pulmonary nodules since previous study.  Stable mild left hilar lymphadenopathy.  No new or progressive metastatic disease identified within the thorax.  Multiple small hypervascular liver metastases appear to have progressed since prior studies, although this may be due at least in part to differences in  contrast bolus timing.  Stable mild gastrohepatic lymphadenopathy.  Aortic Atherosclerosis (ICD10-I70.0) and Emphysema (ICD10-J43.9).   Electronically Signed   By: Earle Gell M.D.   On: 05/12/2017 08:56    ASSESSMENT and PLAN:   No problem-specific Assessment & Plan notes found for this encounter.   No orders of the defined types were placed in this encounter.  This is a very pleasant 76 year old African-American female with recurrent non-small cell lung cancer, adenocarcinoma status post several chemotherapy regimens and currently on treatment with immunotherapy with Nivolumab status post 30 cycles. The patient has been tolerating her treatment well. CT scan results were reviewed with the patient. CT scan showed overall stable disease. Discussed with the patient that there was some question of disease progression in the liver, but this could be due at least in part due to differences in contrast bolus timing. CT scan was reviewed with Dr. Benay Spice who recommended further evaluation the CT by  Dr. Julien Nordmann, but to continue with the treatment plan as outlined.  I recommended for her to proceed with cycle #31 today as scheduled. For the back pain, she will continue meloxicam and follow-up with physical therapy and her primary care provider. There was no evidence of bone metastasis on her x-rays or CT scan.  I will see her back for follow-up visit in 2 weeks for evaluation before the next dose of her treatment. She was advised to call immediately if she has any concerning symptoms in the interval. The patient voices understanding of current disease status and treatment options and is in agreement with the current care plan. All questions were answered. The patient knows to call the clinic with any problems, questions or concerns. We can certainly see the patient much sooner if necessary.  Mikey Bussing, NP 05/13/2017

## 2017-05-13 NOTE — Patient Instructions (Signed)
[facility] Discharge Instructions for Patients Receiving Chemotherapy  Today you received the following chemotherapy agents:  Opdivo  To help prevent nausea and vomiting after your treatment, we encourage you to take your nausea medication as prescribed.   If you develop nausea and vomiting that is not controlled by your nausea medication, call the clinic.   BELOW ARE SYMPTOMS THAT SHOULD BE REPORTED IMMEDIATELY:  *FEVER GREATER THAN 100.5 F  *CHILLS WITH OR WITHOUT FEVER  NAUSEA AND VOMITING THAT IS NOT CONTROLLED WITH YOUR NAUSEA MEDICATION  *UNUSUAL SHORTNESS OF BREATH  *UNUSUAL BRUISING OR BLEEDING  TENDERNESS IN MOUTH AND THROAT WITH OR WITHOUT PRESENCE OF ULCERS  *URINARY PROBLEMS  *BOWEL PROBLEMS  UNUSUAL RASH Items with * indicate a potential emergency and should be followed up as soon as possible.  Feel free to call the clinic you have any questions or concerns. The clinic phone number is [phone].  Please show the CHEMO ALERT CARD at check-in to the Emergency Department and triage nurse.   

## 2017-05-15 DIAGNOSIS — M5137 Other intervertebral disc degeneration, lumbosacral region: Secondary | ICD-10-CM | POA: Insufficient documentation

## 2017-05-15 NOTE — Assessment & Plan Note (Signed)
Continue follow-up/treatment per oncology.

## 2017-05-15 NOTE — Assessment & Plan Note (Signed)
Likely the cause of her pain. Trial of NSAIDS. If no improvement, would consider MRI of the L-spine due to radicular symptoms.

## 2017-05-16 ENCOUNTER — Ambulatory Visit: Payer: Medicare Other | Admitting: Physical Therapy

## 2017-05-19 ENCOUNTER — Telehealth: Payer: Self-pay | Admitting: *Deleted

## 2017-05-19 ENCOUNTER — Other Ambulatory Visit: Payer: Self-pay | Admitting: *Deleted

## 2017-05-19 DIAGNOSIS — C3491 Malignant neoplasm of unspecified part of right bronchus or lung: Secondary | ICD-10-CM

## 2017-05-19 DIAGNOSIS — C3492 Malignant neoplasm of unspecified part of left bronchus or lung: Principal | ICD-10-CM

## 2017-05-19 NOTE — Telephone Encounter (Signed)
Returned call to pt who advised the pain medication Urgent Care gave her is not helping, " I am so weak". Pt phone call then cut out.  Phone disconnected. Returned call to pt, pt is very concerned as she has fallen 4 times in the last week due to weakness. Pt advised she tries to eat but she is in so much pain it's difficult to eat. Pt advised she cannot live with daughter as she cant get up the steps at daughters house. Pt states " the last time I was at her house I got up off the commode and just hit the floor." "Then the other day my neighbor put my dinner in the steps and when I went to bend down and pick it up I just fell right over on the front porch." Informed pt I will s/w MD regarding her multiple falls in 1 weeks, weakness and pain and call back with additional details.

## 2017-05-19 NOTE — Telephone Encounter (Signed)
Patient asked for return call to "speak with nurse.  I'm having quite a bit of pain.  Taking medicine that is not helping."  "Pain started a month ago to my back and legs.  You all told me it could be arthritis but getting progressively worse.  Four falls this week.  Severe weakness, pain to back, legs.  [facility] prescribed Meloxicam 7.5 mg on 05-10-2017 but I can't tell a difference.    Severe weakness is listed as a side effect for Opdivo.  Ask Dr. Julien Nordmann if this is the cause and call me at [phone].  Was bending down to get my dinner a neighbor left on porch twice, at my daughter's once and going to [place].  Lost strength when I fell at the mailbox.  I had to crawl maneuvering myself to hold something to pull myself up.  No dizziness, just severe weakness.  B/P is fine when checked there.  Physical Therapy referral made by staff at [facility].  I can't get legs comfortable enough to sleep.  My one daughter is my only family here.  She works and has long steps in her house."    Will notify provider for further communication or orders from Production designer, theatre/television/film.  This nurse instructions to report to ED for falls for further evaliuation.  Patient mentioned "I do not think I broke anything.  I may need to get a Life Alert."

## 2017-05-19 NOTE — Telephone Encounter (Signed)
Reviewed with MD, Stat CT head ordered, scheduled for 8/7 3pm. Notified pt of appt and gave her # to central scheduling.Pt advised she will call her neighbor for a ride to the appt. No further concerns

## 2017-05-20 ENCOUNTER — Ambulatory Visit (HOSPITAL_COMMUNITY)
Admission: RE | Admit: 2017-05-20 | Discharge: 2017-05-20 | Disposition: A | Discharge status: Home / Self Care | Payer: Medicare Other | Source: Ambulatory Visit | Attending: Internal Medicine | Admitting: Internal Medicine

## 2017-05-20 DIAGNOSIS — C3492 Malignant neoplasm of unspecified part of left bronchus or lung: Secondary | ICD-10-CM | POA: Diagnosis not present

## 2017-05-20 DIAGNOSIS — C3491 Malignant neoplasm of unspecified part of right bronchus or lung: Secondary | ICD-10-CM | POA: Insufficient documentation

## 2017-05-20 DIAGNOSIS — I639 Cerebral infarction, unspecified: Secondary | ICD-10-CM | POA: Insufficient documentation

## 2017-05-20 MED ORDER — IOPAMIDOL (ISOVUE-300) INJECTION 61%
INTRAVENOUS | Status: AC
  Filled 2017-05-20: qty 75

## 2017-05-20 MED ORDER — IOPAMIDOL (ISOVUE-300) INJECTION 61%
75.0000 mL | Freq: Once | INTRAVENOUS | Status: AC | PRN
  Administered 2017-05-20: 75 mL via INTRAVENOUS

## 2017-05-22 ENCOUNTER — Telehealth: Payer: Self-pay | Admitting: Medical Oncology

## 2017-05-22 NOTE — Telephone Encounter (Signed)
Results of mri reviewed w pt. I instructed pt to f/u with PCP as scheduled  in one month and to start PT.

## 2017-05-23 ENCOUNTER — Telehealth: Payer: Self-pay | Admitting: Physician Assistant

## 2017-05-23 NOTE — Telephone Encounter (Signed)
Pt called stating that she has fallen 4 times in the past 2 weeks and is in a lot of pain and cannot walk.  Pt had a follow up with her oncologist and they did a head CT but revealed nothing adnormal. I advised pt that she needed to be seen today or very soon if not today and she refused.  Please advise

## 2017-05-27 ENCOUNTER — Other Ambulatory Visit (HOSPITAL_BASED_OUTPATIENT_CLINIC_OR_DEPARTMENT_OTHER): Payer: Medicare Other

## 2017-05-27 ENCOUNTER — Other Ambulatory Visit: Payer: Self-pay | Admitting: Internal Medicine

## 2017-05-27 ENCOUNTER — Ambulatory Visit (HOSPITAL_BASED_OUTPATIENT_CLINIC_OR_DEPARTMENT_OTHER): Payer: Medicare Other | Admitting: Internal Medicine

## 2017-05-27 ENCOUNTER — Encounter: Payer: Self-pay | Admitting: Internal Medicine

## 2017-05-27 ENCOUNTER — Ambulatory Visit (HOSPITAL_BASED_OUTPATIENT_CLINIC_OR_DEPARTMENT_OTHER): Payer: Medicare Other

## 2017-05-27 ENCOUNTER — Telehealth: Payer: Self-pay | Admitting: Internal Medicine

## 2017-05-27 VITALS — BP 126/71 | HR 87 | Temp 97.8°F | Resp 18 | Ht 64.0 in | Wt 123.2 lb

## 2017-05-27 DIAGNOSIS — C3491 Malignant neoplasm of unspecified part of right bronchus or lung: Secondary | ICD-10-CM

## 2017-05-27 DIAGNOSIS — Z5112 Encounter for antineoplastic immunotherapy: Secondary | ICD-10-CM

## 2017-05-27 DIAGNOSIS — Z86711 Personal history of pulmonary embolism: Secondary | ICD-10-CM | POA: Diagnosis not present

## 2017-05-27 DIAGNOSIS — E039 Hypothyroidism, unspecified: Secondary | ICD-10-CM

## 2017-05-27 DIAGNOSIS — R200 Anesthesia of skin: Secondary | ICD-10-CM

## 2017-05-27 DIAGNOSIS — C3492 Malignant neoplasm of unspecified part of left bronchus or lung: Secondary | ICD-10-CM

## 2017-05-27 DIAGNOSIS — E538 Deficiency of other specified B group vitamins: Secondary | ICD-10-CM | POA: Diagnosis not present

## 2017-05-27 LAB — CBC WITH DIFFERENTIAL/PLATELET
BASO%: 0.8 % (ref 0.0–2.0)
BASOS ABS: 0.1 10*3/uL (ref 0.0–0.1)
EOS ABS: 0.2 10*3/uL (ref 0.0–0.5)
EOS%: 2.2 % (ref 0.0–7.0)
HEMATOCRIT: 36.5 % (ref 34.8–46.6)
HEMOGLOBIN: 12 g/dL (ref 11.6–15.9)
LYMPH#: 1.5 10*3/uL (ref 0.9–3.3)
LYMPH%: 16.9 % (ref 14.0–49.7)
MCH: 29.7 pg (ref 25.1–34.0)
MCHC: 32.9 g/dL (ref 31.5–36.0)
MCV: 90.4 fL (ref 79.5–101.0)
MONO#: 0.6 10*3/uL (ref 0.1–0.9)
MONO%: 6.3 % (ref 0.0–14.0)
NEUT%: 73.8 % (ref 38.4–76.8)
NEUTROS ABS: 6.5 10*3/uL (ref 1.5–6.5)
Platelets: 240 10*3/uL (ref 145–400)
RBC: 4.04 10*6/uL (ref 3.70–5.45)
RDW: 15.7 % — AB (ref 11.2–14.5)
WBC: 8.8 10*3/uL (ref 3.9–10.3)

## 2017-05-27 LAB — COMPREHENSIVE METABOLIC PANEL
ALT: 10 U/L (ref 0–55)
AST: 12 U/L (ref 5–34)
Albumin: 4 g/dL (ref 3.5–5.0)
Alkaline Phosphatase: 103 U/L (ref 40–150)
Anion Gap: 10 mEq/L (ref 3–11)
BILIRUBIN TOTAL: 0.5 mg/dL (ref 0.20–1.20)
BUN: 23.9 mg/dL (ref 7.0–26.0)
CHLORIDE: 106 meq/L (ref 98–109)
CO2: 23 meq/L (ref 22–29)
Calcium: 10.8 mg/dL — ABNORMAL HIGH (ref 8.4–10.4)
Creatinine: 1.2 mg/dL — ABNORMAL HIGH (ref 0.6–1.1)
EGFR: 52 mL/min/{1.73_m2} — ABNORMAL LOW (ref >90)
GLUCOSE: 91 mg/dL (ref 70–140)
Potassium: 4.5 mEq/L (ref 3.5–5.1)
SODIUM: 140 meq/L (ref 136–145)
TOTAL PROTEIN: 7.8 g/dL (ref 6.4–8.3)

## 2017-05-27 MED ORDER — HEPARIN SOD (PORK) LOCK FLUSH 100 UNIT/ML IV SOLN
500.0000 [IU] | Freq: Once | INTRAVENOUS | Status: AC
  Administered 2017-05-27: 500 [IU] via INTRAVENOUS
  Filled 2017-05-27: qty 5

## 2017-05-27 MED ORDER — CYANOCOBALAMIN 1000 MCG/ML IJ SOLN
1000.0000 ug | Freq: Once | INTRAMUSCULAR | Status: AC
  Administered 2017-05-27: 1000 ug via INTRAMUSCULAR

## 2017-05-27 MED ORDER — SODIUM CHLORIDE 0.9% FLUSH
10.0000 mL | Freq: Once | INTRAVENOUS | Status: AC
  Administered 2017-05-27: 10 mL via INTRAVENOUS
  Filled 2017-05-27: qty 10

## 2017-05-27 MED ORDER — NIVOLUMAB CHEMO INJECTION 100 MG/10ML
240.0000 mg | Freq: Once | INTRAVENOUS | Status: AC
  Administered 2017-05-27: 240 mg via INTRAVENOUS
  Filled 2017-05-27: qty 4

## 2017-05-27 MED ORDER — SODIUM CHLORIDE 0.9 % IV SOLN
Freq: Once | INTRAVENOUS | Status: AC
  Administered 2017-05-27: 13:00:00 via INTRAVENOUS

## 2017-05-27 MED ORDER — CYANOCOBALAMIN 1000 MCG/ML IJ SOLN
INTRAMUSCULAR | Status: AC
  Filled 2017-05-27: qty 1

## 2017-05-27 NOTE — Patient Instructions (Signed)
[facility] Discharge Instructions for Patients Receiving Chemotherapy  Today you received the following chemotherapy agents:  Nivolumab.  To help prevent nausea and vomiting after your treatment, we encourage you to take your nausea medication as directed.   If you develop nausea and vomiting that is not controlled by your nausea medication, call the clinic.   BELOW ARE SYMPTOMS THAT SHOULD BE REPORTED IMMEDIATELY:  *FEVER GREATER THAN 100.5 F  *CHILLS WITH OR WITHOUT FEVER  NAUSEA AND VOMITING THAT IS NOT CONTROLLED WITH YOUR NAUSEA MEDICATION  *UNUSUAL SHORTNESS OF BREATH  *UNUSUAL BRUISING OR BLEEDING  TENDERNESS IN MOUTH AND THROAT WITH OR WITHOUT PRESENCE OF ULCERS  *URINARY PROBLEMS  *BOWEL PROBLEMS  UNUSUAL RASH Items with * indicate a potential emergency and should be followed up as soon as possible.  Feel free to call the clinic you have any questions or concerns. The clinic phone number is [phone].  Please show the CHEMO ALERT CARD at check-in to the Emergency Department and triage nurse.   

## 2017-05-27 NOTE — Telephone Encounter (Signed)
Left message for patient to call back she has appointment scheduled for  8/24.  Following up to see how she is feeling

## 2017-05-27 NOTE — Telephone Encounter (Signed)
Gave patient avs and calendar for upcoming appts.  °

## 2017-05-27 NOTE — Progress Notes (Signed)
[place] [phone]   [phone]  OFFICE PROGRESS NOTE  Harrison Mons, PA-C [address] [place] [address]  DIAGNOSIS:  1) recurrent non-small cell lung cancer, adenocarcinoma initially diagnosed as stage IB (T2a, N0, M0) in December 2010. 2) history of acute pulmonary embolism in the right lower lobe lobe are, segmental and subsegmental pulmonary arteries diagnosed in January 2013.  PRIOR THERAPY: 1.Status post right upper lobectomy on September 28, 2009 under the care of Dr. Arlyce Dice. The patient refused adjuvant chemotherapy at that time. She had disease recurrence in June of 2012.  2.Status post 4 cycles of systemic chemotherapy with carboplatin for an AUC of 6 and paclitaxel at 200 mg per meter squared and Avastin at 15 mg/kg according to the ECOG protocol #5508.  3. Chemotherapy with Avastin 15 mg/kg according to the ECOG protocol #5508 status post 32 cycles. Last cycle was given on 06/30/2013 discontinued secondary to disease progression and persistent proteinuria. 4. Tarceva 150 mg by mouth daily, started 08/09/2013, status post 10 months of treatment. 5. Tarceva 100 mg by mouth daily started 07/02/2014, status post 19 months, discontinued secondary to disease progression.  CURRENT THERAPY: Immunotherapy with Nivolumab 240 mg IV every 2 weeks status post 31 cycles. First dose was given 02/20/2016.  INTERVAL HISTORY: [name] 76 y.o. female returns to the clinic today for follow-up visit. The patient is feeling fine today except for the weakness of the lower extremities. Her weakness started 2 weeks ago. She is currently undergoing physical therapy and feeling a little bit better. She intentionally lost few pounds over the last few weeks. She denied having any nausea, vomiting, diarrhea or constipation. She has no fever or chills. She denied having any chest pain, shortness breath, cough or hemoptysis. She had CT scan of the head  performed recently that showed no concerning findings in her brain. She is here today for evaluation before starting cycle #32.   MEDICAL HISTORY: Past Medical History:  Diagnosis Date  . Bilateral lung cancer ([name]) 03/08/2011   recurrent  . Chronic fatigue 02/14/2016  . Dysuria 03/04/2017  . Encounter for antineoplastic immunotherapy 02/14/2016  . Foot pain 09/02/2011  . Glaucoma   . Hip pain 09/02/2011  . Hypercholesterolemia   . Hypertension   . Hypertension 10/29/2016  . Hypothyroidism   . Lung cancer ([name]) 03/08/2011   recurrent    ALLERGIES:  is allergic to cymbalta [duloxetine hcl]; fish allergy; amitriptyline; codeine; and sulfa antibiotics.  MEDICATIONS:  Current Outpatient Prescriptions  Medication Sig Dispense Refill  . amLODipine (NORVASC) 5 MG tablet Take 10 mg by mouth daily. Per [name], NP    . aspirin 81 MG tablet Take 81 mg by mouth daily.    . cephALEXin (KEFLEX) 250 MG capsule TAKE 2 CAPSULES TWICE A DAY FOR 1 WEEK, THEN TAKE ONE CAPSULE EVERY DAY  1  . clonazePAM (KLONOPIN) 0.5 MG tablet Take 0.5 mg by mouth 2 (two) times daily as needed. Per [name], NP    . cyanocobalamin (,VITAMIN B-12,) 1000 MCG/ML injection Inject 1,000 mcg into the muscle every 30 (thirty) days.    . dorzolamide-timolol (COSOPT) 22.3-6.8 MG/ML ophthalmic solution Place 2 drops into both eyes 2 (two) times daily. Per [name], NP    . fluticasone (FLONASE) 50 MCG/ACT nasal spray Place 1 spray into both nostrils daily as needed for allergies or rhinitis.    Marland Kitchen levothyroxine (SYNTHROID, LEVOTHROID) 88 MCG tablet Take 88 mcg  by mouth daily before breakfast.    . lidocaine-prilocaine (EMLA) cream Apply 1 application topically as needed (prior to port access). 30 g 0  . lisinopril (PRINIVIL,ZESTRIL) 40 MG tablet Take 40 mg by mouth daily. Per [name], NP     . meloxicam (MOBIC) 7.5 MG tablet Take 1 tablet (7.5 mg total) by mouth daily. 30 tablet 0  . nitrofurantoin  (MACRODANTIN) 100 MG capsule Take 100 mg by mouth daily.    Marland Kitchen PREMARIN vaginal cream INSERT BLUEBERRY SIZE AMOUNT OF ESTROGEN CREAM INTO VAGINA TWICE WEEKLY.  1   No current facility-administered medications for this visit.     SURGICAL HISTORY:  Past Surgical History:  Procedure Laterality Date  . LUNG LOBECTOMY  09/28/2009   right upper  . THORACOTOMY  09/28/2009   mini  . VIDEO ASSISTED THORACOSCOPY  09/28/2009    REVIEW OF SYSTEMS:  A comprehensive review of systems was negative except for: Constitutional: positive for fatigue Musculoskeletal: positive for back pain and muscle weakness   PHYSICAL EXAMINATION: General appearance: alert, cooperative, fatigued and no distress Head: Normocephalic, without obvious abnormality, atraumatic Neck: no adenopathy, no JVD, supple, symmetrical, trachea midline and thyroid not enlarged, symmetric, no tenderness/mass/nodules Lymph nodes: Cervical, supraclavicular, and axillary nodes normal. Resp: clear to auscultation bilaterally Back: symmetric, no curvature. ROM normal. No CVA tenderness. Cardio: regular rate and rhythm, S1, S2 normal, no murmur, click, rub or gallop GI: soft, non-tender; bowel sounds normal; no masses,  no organomegaly Extremities: extremities normal, atraumatic, no cyanosis or edema  ECOG PERFORMANCE STATUS: 1 - Symptomatic but completely ambulatory  Blood pressure 126/71, pulse 87, temperature 97.8 F (36.6 C), temperature source Oral, resp. rate 18, height 5\' 4"  (1.626 m), weight 123 lb 3.2 oz (55.9 kg), SpO2 98 %.  LABORATORY DATA: Lab Results  Component Value Date   WBC 8.8 05/27/2017   HGB 12.0 05/27/2017   HCT 36.5 05/27/2017   MCV 90.4 05/27/2017   PLT 240 05/27/2017      Chemistry      Component Value Date/Time   NA 140 05/27/2017 1105   K 4.5 05/27/2017 1105   CL 105 01/11/2015 1603   CL 107 03/30/2013 1036   CO2 23 05/27/2017 1105   BUN 23.9 05/27/2017 1105   CREATININE 1.2 (H) 05/27/2017 1105        Component Value Date/Time   CALCIUM 10.8 (H) 05/27/2017 1105   ALKPHOS 103 05/27/2017 1105   AST 12 05/27/2017 1105   ALT 10 05/27/2017 1105   BILITOT 0.50 05/27/2017 1105       RADIOGRAPHIC STUDIES: Dg Thoracic Spine 2 View  Result Date: 04/28/2017 CLINICAL DATA:  Low back pain extending into both legs for 1 month. Personal history of lung cancer. EXAM: THORACIC SPINE 2 VIEWS COMPARISON:  CT of the chest 02/28/2017 FINDINGS: The heart size is normal. A right IJ Port-A-Cath is stable. Lateral right lower lobe pulmonary nodule is not imaged. 12 rib-bearing thoracic type vertebral bodies are present. Vertebral body heights and alignment are maintained. IMPRESSION: No acute or focal abnormality to explain the patient's low back pain. Electronically Signed   By: San Morelle M.D.   On: 04/28/2017 13:51   Dg Lumbar Spine 2-3 Views  Result Date: 04/28/2017 CLINICAL DATA:  Low back pain extending into both lower extremities for 1 month. EXAM: LUMBAR SPINE - 2-3 VIEW COMPARISON:  Acute abdominal series 05/25/2014 FINDINGS: Five non rib-bearing lumbar type vertebral bodies are present. Degenerative retrolisthesis is present at L3-4  and L4-5. Moderate facet hypertrophy is present at both of these levels. Marked endplate degenerative changes are present at L3-4. Right lateral listhesis is present at L2-3. Levoconvex curvature centered at L3. Rightward curvature is centered at T12. Uterine fibroid is present. Vascular calcifications are noted without aneurysm. IMPRESSION: 1. Scoliosis. 2. Multilevel degenerative change in the lower lumbar spine. 3. No acute abnormality. 4. Atherosclerosis. Electronically Signed   By: San Morelle M.D.   On: 04/28/2017 13:49   Ct Head W Wo Contrast  Result Date: 05/20/2017 CLINICAL DATA:  76 y/o F; multiple falls in unexplained altered level of consciousness. History of lung cancer. EXAM: CT HEAD WITHOUT AND WITH CONTRAST TECHNIQUE: Contiguous axial  images were obtained from the base of the skull through the vertex without and with intravenous contrast CONTRAST:  43mL ISOVUE-300 IOPAMIDOL (ISOVUE-300) INJECTION 61% COMPARISON:  03/20/2015 MRI of the head. FINDINGS: Brain: No evidence of acute infarction, hemorrhage, hydrocephalus, extra-axial collection or mass lesion/mass effect. No abnormal enhancement. Stable moderate chronic microvascular ischemic changes and parenchymal volume loss of the brain. 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 Vascular: No hyperdense vessel. Visible vessels are patent. Calcific atherosclerosis of carotid siphons. Skull: Normal. Negative for fracture or focal lesion. Sinuses/Orbits: No acute finding. Other: None. IMPRESSION: 1. No acute intracranial abnormality or abnormal enhancement of the brain. 2. Stable chronic microvascular ischemic changes and parenchymal volume loss of the brain as well as chronic lacunar infarct in right anterior basal ganglia. Electronically Signed   By: Kristine Garbe M.D.   On: 05/20/2017 16:12   Ct Chest W Contrast  Result Date: 05/12/2017 CLINICAL DATA:  Followup metastatic lung carcinoma. Undergoing immunotherapy. 17 pound weight loss over past 2 months . EXAM: CT CHEST WITH CONTRAST TECHNIQUE: Multidetector CT imaging of the chest was performed during intravenous contrast administration. CONTRAST:  96mL ISOVUE-300 IOPAMIDOL (ISOVUE-300) INJECTION 61% COMPARISON:  02/28/2017 FINDINGS: Cardiovascular: No acute findings. Aortic and coronary artery atherosclerosis. Mediastinum/Nodes: Stable mild left hilar lymphadenopathy measuring 12 mm on image 60/2 . No new or increased areas of lymphadenopathy identified. Lungs/Pleura: Stable moderate emphysema. Stable postop changes from right upper lobectomy and bilateral parenchymal scarring, right side greater than left. Spiculated nodule superior left lower lobe measures 1.9 x 1.9 cm on image 59/7 compared to 1.9 x 2.0  cm previously. Central left lower lobe pulmonary nodule in the infrahilar region measures 12 mm on image 74/7, compared to 11 mm previously. Subpleural nodule in the medial left lower lobe measures 2.0 cm on image 81/7, compared to 2.0 cm previously. Part solid nodule in the posteromedial right lower lobe measures 2.2 cm on image 83/7 compared to 2.2 cm previously. 1.0 cm solid nodule in right lower lobe on image 49/7 5 compared to 1.0 cm previously. These nodules show no significant change in size. No new or enlarging pulmonary nodules or masses identified. No evidence of pleural effusion . Upper Abdomen: Normal adrenal glands. Multiple small hypervascular metastases are seen visualized portion of the liver. These appear to have progressed since previous studies, however this may at least in part be due to differences in contrast bolus timing. A 7 mm lymph node in the gastrohepatic ligament on image 131/2 shows no significant change. Musculoskeletal:  No suspicious bone lesions. IMPRESSION: No significant change in multiple bilateral pulmonary nodules since previous study. Stable mild left hilar lymphadenopathy. No new or progressive metastatic disease identified within the thorax. Multiple small hypervascular liver metastases appear to have progressed since prior studies,  although this may be due at least in part to differences in contrast bolus timing. Stable mild gastrohepatic lymphadenopathy. Aortic Atherosclerosis (ICD10-I70.0) and Emphysema (ICD10-J43.9). Electronically Signed   By: Earle Gell M.D.   On: 05/12/2017 08:56    ASSESSMENT AND PLAN:  This is a very pleasant 76 years old African-American female with recurrent non-small cell lung cancer, adenocarcinoma status post several chemotherapy regimens and currently on treatment with immunotherapy with Nivolumab status post 31 cycles. The patient has been tolerating her treatment well. I recommended for the patient to continue her current treatment  with immunotherapy and she will proceed with cycle #32 today as scheduled. I will see her back for follow-up visit in 2 weeks for evaluation with the next cycle of her treatment. She was advised to call immediately if she has any concerning symptoms in the interval. I recommended for her to proceed with cycle #31 today as scheduled. The patient voices understanding of current disease status and treatment options and is in agreement with the current care plan. All questions were answered. The patient knows to call the clinic with any problems, questions or concerns. We can certainly see the patient much sooner if necessary. I spent 10 minutes counseling the patient face to face. The total time spent in the appointment was 15 minutes.  Disclaimer: This note was dictated with voice recognition software. Similar sounding words can inadvertently be transcribed and may not be corrected upon review.

## 2017-05-30 ENCOUNTER — Ambulatory Visit: Payer: Medicare Other | Attending: Physician Assistant | Admitting: Physical Therapy

## 2017-05-30 ENCOUNTER — Encounter: Payer: Self-pay | Admitting: Physical Therapy

## 2017-05-30 DIAGNOSIS — M545 Low back pain: Secondary | ICD-10-CM | POA: Diagnosis present

## 2017-05-30 DIAGNOSIS — G8929 Other chronic pain: Secondary | ICD-10-CM | POA: Insufficient documentation

## 2017-05-30 DIAGNOSIS — M79605 Pain in left leg: Secondary | ICD-10-CM | POA: Insufficient documentation

## 2017-05-30 DIAGNOSIS — M6281 Muscle weakness (generalized): Secondary | ICD-10-CM | POA: Diagnosis present

## 2017-05-30 DIAGNOSIS — M79604 Pain in right leg: Secondary | ICD-10-CM | POA: Insufficient documentation

## 2017-05-30 DIAGNOSIS — R2689 Other abnormalities of gait and mobility: Secondary | ICD-10-CM | POA: Insufficient documentation

## 2017-05-30 NOTE — Therapy (Signed)
[place], [place], [address] Phone: [phone]   Fax:  [phone]  Physical Therapy Evaluation  Patient Details  Name: [name] MRN: [id number] Date of Birth: 11-13-1940 Referring Provider: Harrison Mons  Encounter Date: 05/30/2017      PT End of Session - 05/30/17 1214    Visit Number 1   Number of Visits 17   Date for PT Re-Evaluation 07/25/17   PT Start Time 1104   PT Stop Time 1200   PT Time Calculation (min) 56 min   Activity Tolerance Patient tolerated treatment well   Behavior During Therapy [facility] for tasks assessed/performed      Past Medical History:  Diagnosis Date  . Bilateral lung cancer ([place]) 03/08/2011   recurrent  . Chronic fatigue 02/14/2016  . Dysuria 03/04/2017  . Encounter for antineoplastic immunotherapy 02/14/2016  . Foot pain 09/02/2011  . Glaucoma   . Hip pain 09/02/2011  . Hypercholesterolemia   . Hypertension   . Hypertension 10/29/2016  . Hypothyroidism   . Lung cancer ([place]) 03/08/2011   recurrent    Past Surgical History:  Procedure Laterality Date  . LUNG LOBECTOMY  09/28/2009   right upper  . THORACOTOMY  09/28/2009   mini  . VIDEO ASSISTED THORACOSCOPY  09/28/2009    There were no vitals filed for this visit.       Subjective Assessment - 05/30/17 1107    Subjective I have fallen 5 times recently- in 2 weeks. I am having pain in my back and legs. On Saturday I went to the doctor and she referred me here. Last week I had to have a CT scan and a head scan. I have lung cancer and I had surgery 8 years ago. I am getting infusions still. In the last 2 months I have lost 17 pounds. I have peripheral neuropathy in my fingers and feet.    Pertinent History recurrent non-small cell lung cancer, adenocarcinoma initially diagnosed as stage IB (T2a, N0, M0) in December 2010 now stage IV, history of acute pulmonary embolism in the right lower lobe lobe are,  segmental and subsegmental pulmonary arteries diagnosed in January 2013, Status post right upper lobectomy on September 28, 2009 under the care of Dr. Arlyce Dice. The patient refused adjuvant chemotherapy at that time. She had disease recurrence in June of 2012,Status post 4 cycles of systemic chemotherapy with carboplatin for an AUC of 6 and paclitaxel at 200 mg per meter squared and Avastin at 15 mg/kg, Chemotherapy with Avastin 15 mg/kg, status post 32 cycles. Last cycle was given on 06/30/2013 discontinued secondary to disease progression and persistent proteinuria,Tarceva 150 mg by mouth daily, started 08/09/2013, status post 10 months of treatment,Tarceva 100 mg by mouth daily started 07/02/2014, status post 19 months, discontinued secondary to disease progression, Current therapy: Immunotherapy with Nivolumab 240 mg IV every 2 weeks status post 31 cycles. First dose was given 02/20/2016   Patient Stated Goals to be able to walk again and to be independent and stay at home   Currently in Pain? Yes   Pain Score 8    Pain Location Leg   Pain Orientation Right;Upper;Mid   Pain Descriptors / Indicators Aching   Pain Type Acute pain   Pain Onset 1 to 4 weeks ago   Pain Frequency Constant   Aggravating Factors  when the medicine wears off   Pain Relieving Factors meloxicam   Effect of Pain on Daily Activities numerous falls, difficulty  walking            [facility] PT Assessment - 05/30/17 0001      Assessment   Medical Diagnosis stage IV lung cancer   Referring Provider Harrison Mons   Onset Date/Surgical Date 09/28/09   Hand Dominance Right   Prior Therapy none     Precautions   Precautions Fall     Restrictions   Weight Bearing Restrictions No     Balance Screen   Has the patient fallen in the past 6 months Yes   How many times? 5   Has the patient had a decrease in activity level because of a fear of falling?  Yes   Is the patient reluctant to leave their home because of a fear of  falling?  Yes     [place] Private residence   Living Arrangements Alone  with dog   Available Help at Discharge Neighbor;Family   Type of [place] Access Level entry   Home Layout Multi-level   Alternate Level Stairs-Number of Steps 6   Alternate Level Stairs-Rails Right   Home Equipment Shower seat - built in     Prior Function   Level of [place] Retired   Leisure pt reports she does not exercise     Cognition   Overall Cognitive Status Within Functional Limits for tasks assessed     ROM / Strength   AROM / PROM / Strength Strength     Strength   Strength Assessment Site Hip;Knee;Ankle   Right Hip Flexion 3+/5   Right Hip ABduction 5/5   Right Hip ADduction 5/5   Left Hip Flexion 3/5   Left Hip ABduction 5/5   Left Hip ADduction 5/5   Right/Left Knee Right;Left   Right Knee Flexion 3+/5   Right Knee Extension 3+/5   Left Knee Flexion 3/5   Left Knee Extension 3/5   Right/Left Ankle Right;Left   Right Ankle Dorsiflexion 3/5   Left Ankle Dorsiflexion 3+/5     Ambulation/Gait   Ambulation/Gait Yes   Ambulation Distance (Feet) 100 Feet   Assistive device Straight cane  using it for 2 years- needs front wheeled walker   Gait Pattern Step-through pattern;Decreased arm swing - right;Decreased arm swing - left;Decreased step length - right;Decreased step length - left;Decreased hip/knee flexion - right;Decreased hip/knee flexion - left;Decreased stride length   Ambulation Surface Level   Gait velocity pt ambulates slowly   pt had a loss of balance to the right at the end of 100 ft     Standardized Balance Assessment   Standardized Balance Assessment Five Times Sit to Stand  32. 5 sec, use of arm rests   Five times sit to stand comments  32.5 sec, 103 HR, 98% O2            Objective measurements completed on examination: See above findings.                  PT Education -  05/30/17 1213    Education provided Yes   Education Details to obtain a front wheeled rolling walker, allow neighbor to drive her to appointments secondary to fall risk   Person(s) Educated Patient   Methods Explanation;Handout   Comprehension Verbalized understanding           Short Term Clinic Goals - 05/30/17 1233      CC Short Term Goal  #1   Title  Pt to demonstrate 3+/5 R ankle dorsiflexion strength to decrease fall risk   Baseline 3/5   Time 4   Period Weeks   Status New   Target Date 06/27/17     CC Short Term Goal  #2   Title P to demonstrate left hip flexor strength of 3+/5 to decrease risk of falls.   Baseline 3/5   Time 4   Period Weeks   Status New   Target Date 06/27/17     CC Short Term Goal  #3   Title Pt to obtain a front wheeled rolling walker to use in community to decrease fall risk.   Baseline pt currently using straight cane   Time 4   Period Weeks   Status New   Target Date 06/27/17     CC Short Term Goal  #4   Title Pt to report no falls over the next 4 weeks to decrease risk of hospitilization   Baseline 5 falls in 2 weeks   Time 4   Period Weeks   Status New   Target Date 06/27/17             Long Term Clinic Goals - 05/30/17 1234      CC Long Term Goal  #1   Title Pt to demonstrate bilateral hip flexor strength of 4+/5 to decrease risk of falls   Baseline 3+/5 R, 3/5 L    Time 8   Period Weeks   Status New   Target Date 07/25/17     CC Long Term Goal  #2   Title Pt to demonstrate 4+/5 bilateral ankle dorsiflexion strength to decrease risk of falls.   Baseline 3/5 R, 3+/5 L   Time 8   Period Weeks   Status New   Target Date 07/25/17     CC Long Term Goal  #3   Title Pt to demonstrate 4+/5 bilateral hamstring strength to decrease risk of falls.   Baseline 3+/5 R, 3/5 left   Time 8   Period Weeks   Status New   Target Date 07/25/17     CC Long Term Goal  #4   Title Pt to be independent in a home exercise program  for continued strengthening and stretching   Time 8   Period Weeks   Status New   Target Date 07/25/17             Plan - 05/30/17 1215    Clinical Impression Statement Pt presents to PT with LE weakness resulting in five falls over the last 2 weeks. 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 Pt has a history of stage IV bilateral lung cancer. She is currently undergoing immunotherapy. Pt is very fatigued and gets winded easily throughout evaluation. Discussed the option of home health services with the patient but she states she would rather do outpatient. Pt drove herself here today but is very unsteady on her feet and ambulates with a cane but needs hand held assist as well. Pt states she has a neighbor that can bring her to her appointments. Encouraged pt to have her neighbor drive her as she is at a very high fall risk. Pt demonstrates grossly 3 to 3+/5 throughout bilateral LEs from ankles to hips. She is very unsteady when walking and would benefit from a front wheeled walker. Gave this information to pt to obtain a walker. She has pain in both legs with her right  leg being the most painful. She would benefit from skilled PT services to increase bilateral LE strength from hips to ankles, increase core strength to help decrease back pain, improve gait and independence with gait, and decrease LE pain.    History and Personal Factors relevant to plan of care: stage IV lung cancer, high fall risk, pt lives alone, pt concerned about possibly going into assisted living/SNF   Clinical Presentation Unstable   Clinical Presentation due to: high fall risk, evolving medical condition, sudden onset of falls with 5 in 2 weeks, hx pulmonary embolism, stage IV cancer   Clinical Decision Making High   Rehab Potential Fair   Clinical Impairments Affecting Rehab Potential stage IV cancer, high fall risk, pt lives alone   PT Frequency 2x / week   PT Duration 8 weeks   PT  Treatment/Interventions ADLs/Self Care Home Management;Therapeutic activities;Therapeutic exercise;Neuromuscular re-education;Patient/family education;Manual techniques;Gait training;Stair training;Fluidtherapy   PT Next Visit Plan do BERG and gait speed and add goals for those, begin supine LE strengthening exercises- monitor O2 and shortness of breath   Consulted and Agree with Plan of Care Patient      Patient will benefit from skilled therapeutic intervention in order to improve the following deficits and impairments:  Abnormal gait, Decreased endurance, Decreased activity tolerance, Pain, Decreased balance, Decreased knowledge of use of DME, Decreased mobility, Decreased strength, Impaired sensation  Visit Diagnosis: Muscle weakness (generalized) - Plan: PT plan of care cert/re-cert  Other abnormalities of gait and mobility - Plan: PT plan of care cert/re-cert  Chronic low back pain, unspecified back pain laterality, with sciatica presence unspecified - Plan: PT plan of care cert/re-cert  Pain in left leg - Plan: PT plan of care cert/re-cert  Pain in right leg - Plan: PT plan of care cert/re-cert      G-Codes - 52/84/13 1238    Functional Assessment Tool Used (Outpatient Only) per clinical judgement   Functional Limitation Mobility: Walking and moving around   Mobility: Walking and Moving Around Current Status ([id number]) At least 60 percent but less than 80 percent impaired, limited or restricted   Mobility: Walking and Moving Around Goal Status [phone]) At least 20 percent but less than 40 percent impaired, limited or restricted       Problem List Patient Active Problem List   Diagnosis Date Noted  . DDD (degenerative disc disease), lumbosacral 05/15/2017  . Dysuria 03/04/2017  . Hypothyroidism (acquired) 10/29/2016  . Hypertension 10/29/2016  . Encounter for antineoplastic immunotherapy 02/14/2016  . Chronic fatigue 02/14/2016  . Port catheter in place 02/09/2016  . Urine  frequency 07/06/2014  . Diarrhea 05/24/2014  . Nausea and vomiting 05/24/2014  . Sepsis ([place]) 05/24/2014  . Hypothermia 05/24/2014  . Hypokalemia 05/24/2014  . Nausea vomiting and diarrhea 05/24/2014  . Proteinuria 04/28/2012  . Vitamin B12 deficiency 03/17/2012  . Unspecified deficiency anemia 12/02/2011  . 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 Multinodular goiter 11/29/2011  . Numbness 11/29/2011  . Acute pulmonary embolism ([place]) 11/04/2011  . Acute pulmonary embolism ([place]) 11/04/2011  . Hip pain 09/02/2011  . Foot pain 09/02/2011  . Bilateral lung cancer ([place]) 03/08/2011    Allyson Sabal [facility] 05/30/2017, 12:39 PM  [place], [place], [address] Phone: [phone]   Fax:  [phone]  Name: [name] MRN: [id number] Date of Birth: [birth year]  Manus Gunning, PT 05/30/17 12:40 PM

## 2017-05-31 NOTE — Telephone Encounter (Signed)
Pt states she has pain in legs, has been taking meloxacam but is still experiencing pain, PT has gone to physical therapy.  Please call to advise [phone] or [phone]

## 2017-06-02 ENCOUNTER — Telehealth: Payer: Self-pay

## 2017-06-02 NOTE — Telephone Encounter (Signed)
Adv pt would have her megs. Left on the 18th and the time frame is 48-72 hours for a return call.

## 2017-06-02 NOTE — Telephone Encounter (Signed)
Called patient back from her message from 2 days ago regarding her leg pain. Pt didn't answer and I left a message advising her to call back.

## 2017-06-02 NOTE — Telephone Encounter (Signed)
Called and left message with patient to call back.

## 2017-06-04 ENCOUNTER — Ambulatory Visit: Payer: Medicare Other | Admitting: Physical Therapy

## 2017-06-04 DIAGNOSIS — M79605 Pain in left leg: Secondary | ICD-10-CM

## 2017-06-04 DIAGNOSIS — R2689 Other abnormalities of gait and mobility: Secondary | ICD-10-CM

## 2017-06-04 DIAGNOSIS — M79604 Pain in right leg: Secondary | ICD-10-CM

## 2017-06-04 DIAGNOSIS — M6281 Muscle weakness (generalized): Secondary | ICD-10-CM | POA: Diagnosis not present

## 2017-06-04 DIAGNOSIS — M545 Low back pain: Secondary | ICD-10-CM

## 2017-06-04 DIAGNOSIS — G8929 Other chronic pain: Secondary | ICD-10-CM

## 2017-06-04 NOTE — Therapy (Signed)
[place], [place], [address] Phone: [phone]   Fax:  [phone]  Physical Therapy Treatment  Patient Details  Name: [name] MRN: [id number] Date of Birth: 03/26/1941 Referring Provider: Harrison Mons  Encounter Date: 06/04/2017      PT End of Session - 06/04/17 1246    Visit Number 2   Number of Visits 17   Date for PT Re-Evaluation 07/25/17   PT Start Time 0930   PT Stop Time 1015   PT Time Calculation (min) 45 min   Activity Tolerance Patient tolerated treatment well   Behavior During Therapy [facility] for tasks assessed/performed      Past Medical History:  Diagnosis Date  . Bilateral lung cancer ([place]) 03/08/2011   recurrent  . Chronic fatigue 02/14/2016  . Dysuria 03/04/2017  . Encounter for antineoplastic immunotherapy 02/14/2016  . Foot pain 09/02/2011  . Glaucoma   . Hip pain 09/02/2011  . Hypercholesterolemia   . Hypertension   . Hypertension 10/29/2016  . Hypothyroidism   . Lung cancer ([place]) 03/08/2011   recurrent    Past Surgical History:  Procedure Laterality Date  . LUNG LOBECTOMY  09/28/2009   right upper  . THORACOTOMY  09/28/2009   mini  . VIDEO ASSISTED THORACOSCOPY  09/28/2009    There were no vitals filed for this visit.      Subjective Assessment - 06/04/17 0945    Subjective Pt states she had another fall since she was here.  She has not gotten a walker and is not sure where to get one.  She is going to see her PCP on Friday and plans to get a presciption for one and talk about it then   Pertinent History recurrent non-small cell lung cancer, adenocarcinoma initially diagnosed as stage IB (T2a, N0, M0) in December 2010 now stage IV, history of acute pulmonary embolism in the right lower lobe lobe are, segmental and subsegmental pulmonary arteries diagnosed in January 2013, Status post right upper lobectomy on September 28, 2009 under the care of Dr. Arlyce Dice. The  patient refused adjuvant chemotherapy at that time. She had disease recurrence in June of 2012,Status post 4 cycles of systemic chemotherapy with carboplatin for an AUC of 6 and paclitaxel at 200 mg per meter squared and Avastin at 15 mg/kg, Chemotherapy with Avastin 15 mg/kg, status post 32 cycles. Last cycle was given on 06/30/2013 discontinued secondary to disease progression and persistent proteinuria,Tarceva 150 mg by mouth daily, started 08/09/2013, status post 10 months of treatment,Tarceva 100 mg by mouth daily started 07/02/2014, status post 19 months, discontinued secondary to disease progression, Current therapy: Immunotherapy with Nivolumab 240 mg IV every 2 weeks status post 31 cycles. First dose was given 02/20/2016   Patient Stated Goals to be able to walk again and to be independent and stay at home   Currently in Pain? Yes   Pain Score 4    Pain Location Leg   Pain Orientation Right;Left;Anterior;Upper   Pain Descriptors / Indicators Aching   Pain Type Chronic pain   Pain Onset More than a month ago   Aggravating Factors  walking and moving    Pain Relieving Factors sitting down                          [facility] Adult PT Treatment/Exercise - 06/04/17 0001      Lumbar Exercises: Seated   Other Seated Lumbar Exercises sitting arms  out in front and then from hip to front and then to other hip x 10 reps      Knee/Hip Exercises: Standing   Heel Raises Both;5 reps   Other Standing Knee Exercises mini squats standing at counter top x 10      Knee/Hip Exercises: Supine   Short Arc Quad Sets AROM;Right;Left;2 sets;10 reps   Other Supine Knee/Hip Exercises knee to chest x 10 reps with  each leg    Other Supine Knee/Hip Exercises hip abduction with yellow theraband aournd distal thighs x 10 reps      Shoulder Exercises: Supine   Other Supine Exercises with cane, 10 reps push up to ceiling                 PT Education - 06/04/17 1246    Education provided  Yes   Education Details standing heel raises and mini squats for home program    Person(s) Educated Patient   Methods Explanation;Demonstration;Handout   Comprehension Verbalized understanding;Returned demonstration           Short Term Clinic Goals - 05/30/17 1233      CC Short Term Goal  #1   Title Pt to demonstrate 3+/5 R ankle dorsiflexion strength to decrease fall risk   Baseline 3/5   Time 4   Period Weeks   Status New   Target Date 06/27/17     CC Short Term Goal  #2   Title P to demonstrate left hip flexor strength of 3+/5 to decrease risk of falls.   Baseline 3/5   Time 4   Period Weeks   Status New   Target Date 06/27/17     CC Short Term Goal  #3   Title Pt to obtain a front wheeled rolling walker to use in community to decrease fall risk.   Baseline pt currently using straight cane   Time 4   Period Weeks   Status New   Target Date 06/27/17     CC Short Term Goal  #4   Title Pt to report no falls over the next 4 weeks to decrease risk of hospitilization   Baseline 5 falls in 2 weeks   Time 4   Period Weeks   Status New   Target Date 06/27/17             Long Term Clinic Goals - 05/30/17 1234      CC Long Term Goal  #1   Title Pt to demonstrate bilateral hip flexor strength of 4+/5 to decrease risk of falls   Baseline 3+/5 R, 3/5 L    Time 8   Period Weeks   Status New   Target Date 07/25/17     CC Long Term Goal  #2   Title Pt to demonstrate 4+/5 bilateral ankle dorsiflexion strength to decrease risk of falls.   Baseline 3/5 R, 3+/5 L   Time 8   Period Weeks   Status New   Target Date 07/25/17     CC Long Term Goal  #3   Title Pt to demonstrate 4+/5 bilateral hamstring strength to decrease risk of falls.   Baseline 3+/5 R, 3/5 left   Time 8   Period Weeks   Status New   Target Date 07/25/17     CC Long Term Goal  #4   Title Pt to be independent in a home exercise program for continued strengthening and stretching   Time 8    Period Weeks  Status New   Target Date 07/25/17            Plan - 06/04/17 1247    Clinical Impression Statement Pt reports she is continuing with weakness in her legs, and has not been able to get a rolling walker yet.  She acknowledges that she needs one. Did not do additional testing, will defer til next session  She did well with intial exercise and said she would be able to do her home exercise at kitchen counter for support.    Rehab Potential Fair   Clinical Impairments Affecting Rehab Potential stage IV cancer, high fall risk, pt lives alone   PT Frequency 2x / week   PT Duration 8 weeks   PT Treatment/Interventions ADLs/Self Care Home Management;Therapeutic activities;Therapeutic exercise;Neuromuscular re-education;Patient/family education;Manual techniques;Gait training;Stair training;Fluidtherapy   PT Next Visit Plan do BERG and gait speed and add goals for those, begin supine LE strengthening exercises- monitor O2 and shortness of breath   Consulted and Agree with Plan of Care Patient      Patient will benefit from skilled therapeutic intervention in order to improve the following deficits and impairments:  Abnormal gait, Decreased endurance, Decreased activity tolerance, Pain, Decreased balance, Decreased knowledge of use of DME, Decreased mobility, Decreased strength, Impaired sensation  Visit Diagnosis: Muscle weakness (generalized)  Other abnormalities of gait and mobility  Chronic low back pain, unspecified back pain laterality, with sciatica presence unspecified  Pain in left leg  Pain in right leg     Problem List Patient Active Problem List   Diagnosis Date Noted  . DDD (degenerative disc disease), lumbosacral 05/15/2017  . Dysuria 03/04/2017  . Hypothyroidism (acquired) 10/29/2016  . Hypertension 10/29/2016  . Encounter for antineoplastic immunotherapy 02/14/2016  . Chronic fatigue 02/14/2016  . Port catheter in place 02/09/2016  . Urine frequency  07/06/2014  . Diarrhea 05/24/2014  . Nausea and vomiting 05/24/2014  . Sepsis ([place]) 05/24/2014  . Hypothermia 05/24/2014  . Hypokalemia 05/24/2014  . Nausea vomiting and diarrhea 05/24/2014  . Proteinuria 04/28/2012  . Vitamin B12 deficiency 03/17/2012  . Unspecified deficiency anemia 12/02/2011  . 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 Multinodular goiter 11/29/2011  . Numbness 11/29/2011  . Acute pulmonary embolism ([place]) 11/04/2011  . Acute pulmonary embolism ([place]) 11/04/2011  . Hip pain 09/02/2011  . Foot pain 09/02/2011  . Bilateral lung cancer ([place]) 03/08/2011   [name] PT  [name] 06/04/2017, 12:51 PM  [place], [place], [address] Phone: [phone]   Fax:  [phone]  Name: [name] MRN: [id number] Date of Birth: 04-05-1941

## 2017-06-04 NOTE — Patient Instructions (Addendum)
Heel Raise: Bilateral (Standing)   HOLD ONTO KITCHEN COUNTER     Rise on balls of feet. Repeat __5__ times per set. Do __2__ sets per session.  Mini-Squats (Standing)    Stand with support. Bend knees slightly. Tighten pelvic floor. Hold for _2__ seconds.  Repeat 10___ times.   Copyright  VHI. All rights reserved.

## 2017-06-05 ENCOUNTER — Telehealth: Payer: Self-pay | Admitting: Physical Therapy

## 2017-06-05 ENCOUNTER — Ambulatory Visit: Payer: Medicare Other | Admitting: Physical Therapy

## 2017-06-05 DIAGNOSIS — M6281 Muscle weakness (generalized): Secondary | ICD-10-CM

## 2017-06-05 NOTE — Therapy (Signed)
[place], [place], [address] Phone: [phone]   Fax:  [phone]  Patient Details  Name: [name] MRN: [id number] Date of Birth: 1940-11-05 Referring Provider:  Harrison Mons, PA-C  Encounter Date: 06/05/2017  Patient arrived today for therapy 20 minutes late. Patient states she is very sore because she fell while getting out of the car at home following her rehab appointment yesterday. She states she went to stand up to get out of the car and just fell down. She does not think it was from exercising because she says she felt better after the exercises yesterday. She states she does not feel up to therapy today because of her increased soreness. A caregiver drove her to her appointment today and assisted her after she fell yesterday. Pt is going to follow up with her primary care physician tomorrow and will hopefully obtain a walker to help reduce fall risk.   Allyson Sabal [facility] 06/05/2017, 11:45 AM  [place], [place], [address] Phone: [phone]   Fax:  [phone]

## 2017-06-06 ENCOUNTER — Ambulatory Visit (INDEPENDENT_AMBULATORY_CARE_PROVIDER_SITE_OTHER): Payer: Medicare Other | Admitting: Physician Assistant

## 2017-06-06 ENCOUNTER — Encounter: Payer: Self-pay | Admitting: Physician Assistant

## 2017-06-06 VITALS — BP 125/64 | HR 97 | Temp 97.4°F | Resp 18 | Ht 64.0 in | Wt 123.0 lb

## 2017-06-06 DIAGNOSIS — M5137 Other intervertebral disc degeneration, lumbosacral region: Secondary | ICD-10-CM | POA: Diagnosis not present

## 2017-06-06 DIAGNOSIS — M5442 Lumbago with sciatica, left side: Secondary | ICD-10-CM

## 2017-06-06 DIAGNOSIS — M6281 Muscle weakness (generalized): Secondary | ICD-10-CM

## 2017-06-06 DIAGNOSIS — I10 Essential (primary) hypertension: Secondary | ICD-10-CM | POA: Diagnosis not present

## 2017-06-06 DIAGNOSIS — Z23 Encounter for immunization: Secondary | ICD-10-CM

## 2017-06-06 DIAGNOSIS — R296 Repeated falls: Secondary | ICD-10-CM

## 2017-06-06 DIAGNOSIS — M5441 Lumbago with sciatica, right side: Secondary | ICD-10-CM | POA: Diagnosis not present

## 2017-06-06 MED ORDER — TRAMADOL HCL 50 MG PO TABS: 50.0000 mg | ORAL_TABLET | Freq: Four times a day (QID) | ORAL | 0 refills | Status: DC | PRN

## 2017-06-06 NOTE — Assessment & Plan Note (Signed)
Controlled. Continue current treatment. 

## 2017-06-06 NOTE — Progress Notes (Signed)
Patient ID: [name], female    DOB: Jun 04, 1941, 76 y.o.   MRN: [id number]  PCP: Harrison Mons, PA-C  Chief Complaint  Patient presents with  . Back Pain    Pt states pain about the same since last visit but she always has pain even when takes meds.  . Follow-up    Depression scale score 11    Subjective:   Presents for evaluation of persistent leg pain and weakness.  She reports 6 falls since my visit with her 05/10/2017. Not really experiencing any pain in the back at this time. Radiographs of the lumbar spine ordered by oncology last month reveals multilevel changes of the lower lumbar spine, scoliosis and atherosclerosis, but no bony metastases from her lung cancer. Meloxicam helps a little bit, but not more than the acetaminophen. Dr. Earlie Server recommended naproxen, but that didn't work any better. She has been going to PT to work on increasing her lower extremity and core weakness and balance. PT recommends a rolling walker to reduce her fall risk.  Refuses seasonal flu vaccine and pneumococcal vaccines, reporting that the [place] usually gives these to her.  She is lonely. Her adult children aren't around much. She isn't interested in additional treatment. Has previously tried duloxetine, for peripheral neuropathy, but didn't tolerate it. Has also previously tried amitriptyline remotely and had some type of reaction or intolerance.   Depression screen [facility] 2/9 06/06/2017 05/10/2017  Decreased Interest 1 0  Down, Depressed, Hopeless 1 0  PHQ - 2 Score 2 0  Altered sleeping 1 -  Tired, decreased energy 2 -  Change in appetite 2 -  Feeling bad or failure about yourself  1 -  Trouble concentrating 0 -  Moving slowly or fidgety/restless 3 -  Suicidal thoughts 0 -  PHQ-9 Score 11 -  Difficult doing work/chores Not difficult at all -  Some recent data might be hidden     Review of Systems As above.    Patient Active Problem List   Diagnosis Date  Noted  . DDD (degenerative disc disease), lumbosacral 05/15/2017  . Incontinence of feces 04/23/2017  . Vaginal atrophy 04/23/2017  . Voiding dysfunction 04/23/2017  . Recurrent UTI 04/23/2017  . Dysuria 03/04/2017  . Hypertension 10/29/2016  . Encounter for antineoplastic immunotherapy 02/14/2016  . Chronic fatigue 02/14/2016  . Port catheter in place 02/09/2016  . Urine frequency 07/06/2014  . Diarrhea 05/24/2014  . Nausea and vomiting 05/24/2014  . Sepsis ([place]) 05/24/2014  . Hypothermia 05/24/2014  . Hypokalemia 05/24/2014  . Nausea vomiting and diarrhea 05/24/2014  . Proteinuria 04/28/2012  . Vitamin B12 deficiency 03/17/2012  . Unspecified deficiency anemia 12/02/2011  . 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 Multinodular goiter 11/29/2011  . Numbness 11/29/2011  . Acute pulmonary embolism ([place]) 11/04/2011  . Acute pulmonary embolism ([place]) 11/04/2011  . Hip pain 09/02/2011  . Foot pain 09/02/2011  . Bilateral lung cancer ([place]) 03/08/2011     Prior to Admission medications   Medication Sig Start Date End Date Taking? Authorizing Provider  amLODipine (NORVASC) 5 MG tablet Take 10 mg by mouth daily. Per [name], NP   Yes [provider]  aspirin 81 MG tablet Take 81 mg by mouth daily.   Yes [provider]  cephALEXin (KEFLEX) 250 MG capsule TAKE 2 CAPSULES TWICE A DAY FOR 1 WEEK, THEN TAKE ONE CAPSULE EVERY DAY 04/23/17  Yes [provider]  clonazePAM (KLONOPIN) 0.5 MG tablet Take 0.5  mg by mouth 2 (two) times daily as needed. Per [name], NP   Yes [provider]  cyanocobalamin (,VITAMIN B-12,) 1000 MCG/ML injection Inject 1,000 mcg into the muscle every 30 (thirty) days.   Yes [provider]  dorzolamide-timolol (COSOPT) 22.3-6.8 MG/ML ophthalmic solution Place 2 drops into both eyes 2 (two) times daily. Per [name], NP   Yes [provider]  fluticasone (FLONASE) 50 MCG/ACT nasal spray  Place 1 spray into both nostrils daily as needed for allergies or rhinitis.   Yes [provider]  levothyroxine (SYNTHROID, LEVOTHROID) 88 MCG tablet Take 88 mcg by mouth daily before breakfast.   Yes [provider]  lidocaine-prilocaine (EMLA) cream Apply 1 application topically as needed (prior to port access). 03/05/16  Yes Curt Bears, MD  lisinopril (PRINIVIL,ZESTRIL) 40 MG tablet Take 40 mg by mouth daily. Per [name], NP    Yes [provider]  meloxicam (MOBIC) 7.5 MG tablet Take 1 tablet (7.5 mg total) by mouth daily. 05/10/17  Yes Jadene Stemmer, PA-C  PREMARIN vaginal cream INSERT BLUEBERRY SIZE AMOUNT OF ESTROGEN CREAM INTO VAGINA TWICE WEEKLY. 04/25/17  Yes [provider]  nitrofurantoin (MACRODANTIN) 100 MG capsule Take 100 mg by mouth daily. 03/31/17   [provider]     Allergies  Allergen Reactions  . Cymbalta [Duloxetine Hcl] Other (See Comments)    Involuntary movements " turned me completely around"  . Fish Allergy Diarrhea  . Amitriptyline     Initial reaction about 25 years ago.  . Codeine Nausea And Vomiting    vomiting  . Sulfa Antibiotics Nausea And Vomiting       Objective:  Physical Exam  Constitutional: She is oriented to person, place, and time. She appears well-developed and well-nourished. She is active and cooperative. No distress.  BP 125/64 (BP Location: Right Arm, Patient Position: Sitting, Cuff Size: Normal)   Pulse 97   Temp (!) 97.4 F (36.3 C) (Oral)   Resp 18   Ht 5\' 4"  (1.626 m)   Wt 123 lb (55.8 kg)   SpO2 96%   BMI 21.11 kg/m   HENT:  Head: Normocephalic and atraumatic.  Right Ear: Hearing normal.  Left Ear: Hearing normal.  Eyes: Conjunctivae are normal. No scleral icterus.  Neck: Normal range of motion. Neck supple. No thyromegaly present.  Cardiovascular: Normal rate, regular rhythm and normal heart sounds.   Pulses:      Radial pulses are 2+ on the right side, and 2+ on  the left side.  Pulmonary/Chest: Effort normal. She has decreased breath sounds. She has no wheezes. She has no rhonchi. She has no rales.  Lymphadenopathy:       Head (right side): No tonsillar, no preauricular, no posterior auricular and no occipital adenopathy present.       Head (left side): No tonsillar, no preauricular, no posterior auricular and no occipital adenopathy present.    She has no cervical adenopathy.       Right: No supraclavicular adenopathy present.       Left: No supraclavicular adenopathy present.  Neurological: She is alert and oriented to person, place, and time. No sensory deficit.  Skin: Skin is warm, dry and intact. No rash noted. No cyanosis or erythema. Nails show no clubbing.  Psychiatric: Her speech is normal and behavior is normal. Her mood appears not anxious. Her affect is not angry, not blunt, not labile and not inappropriate. She exhibits a depressed mood.  Assessment & Plan:   Problem List Items Addressed This Visit    Hypertension - Primary    Controlled. Continue current treatment.      DDD (degenerative disc disease), lumbosacral    Trial of tramadol. If not beneficial, would try gabapentin or pregabalin.      Relevant Medications   traMADol (ULTRAM) 50 MG tablet   Muscle weakness (generalized)    Continue PT. Change from straight cane to front rolling walker. Refer to neurology for additional evaluation. Recommend MRI L-spine, but she is concerned about the cost, and prefers to see neurology first.      Relevant Orders   For home use only DME Walker rolling   Ambulatory referral to Neurology   Multiple falls    Advance from straight cane to rolling walker and continue PT for strengthening exercises. Ask neurology to evaluate.       Other Visit Diagnoses    Acute bilateral low back pain with bilateral sciatica       Relevant Medications   traMADol (ULTRAM) 50 MG tablet   Need for influenza vaccination       Refused, stating  she'd get this at the [facility].   Need for pneumococcal vaccination       Refused, stating she'd get this at the [facility].       Return in about 4 weeks (around 07/04/2017).   Fara Chute, PA-C [facility] at [place]

## 2017-06-06 NOTE — Patient Instructions (Addendum)
[facility]  [address], [place], [place] [address] [phone]     IF you received an x-ray today, you will receive an invoice from [facility]. Please contact [facility] at [phone] with questions or concerns regarding your invoice.   IF you received labwork today, you will receive an invoice from [place]. Please contact [facility] at [phone] with questions or concerns regarding your invoice.   Our billing staff will not be able to assist you with questions regarding bills from these companies.  You will be contacted with the lab results as soon as they are available. The fastest way to get your results is to activate your My Chart account. Instructions are located on the last page of this paperwork. If you have not heard from [place] regarding the results in 2 weeks, please contact this office.

## 2017-06-06 NOTE — Assessment & Plan Note (Addendum)
Continue PT. Change from straight cane to front rolling walker. Refer to neurology for additional evaluation. Recommend MRI L-spine, but she is concerned about the cost, and prefers to see neurology first.

## 2017-06-06 NOTE — Assessment & Plan Note (Signed)
Trial of tramadol. If not beneficial, would try gabapentin or pregabalin.

## 2017-06-06 NOTE — Assessment & Plan Note (Signed)
Advance from straight cane to rolling walker and continue PT for strengthening exercises. Ask neurology to evaluate.

## 2017-06-07 ENCOUNTER — Other Ambulatory Visit: Payer: Self-pay | Admitting: Physician Assistant

## 2017-06-07 DIAGNOSIS — M5137 Other intervertebral disc degeneration, lumbosacral region: Secondary | ICD-10-CM

## 2017-06-09 ENCOUNTER — Encounter: Payer: Self-pay | Admitting: Physical Therapy

## 2017-06-09 ENCOUNTER — Ambulatory Visit: Payer: Medicare Other | Admitting: Physical Therapy

## 2017-06-09 DIAGNOSIS — M6281 Muscle weakness (generalized): Secondary | ICD-10-CM

## 2017-06-09 DIAGNOSIS — R2689 Other abnormalities of gait and mobility: Secondary | ICD-10-CM

## 2017-06-09 NOTE — Therapy (Signed)
[place], [place], [address] Phone: [phone]   Fax:  [phone]  Physical Therapy Treatment  Patient Details  Name: [name] MRN: [id number] Date of Birth: February 11, 1941 Referring Provider: Harrison Mons  Encounter Date: 06/09/2017      PT End of Session - 06/09/17 1609    Visit Number 3   Number of Visits 17   Date for PT Re-Evaluation 07/25/17   PT Start Time 1520   PT Stop Time 1601   PT Time Calculation (min) 41 min   Activity Tolerance Patient tolerated treatment well   Behavior During Therapy [facility] for tasks assessed/performed      Past Medical History:  Diagnosis Date  . Bilateral lung cancer ([place]) 03/08/2011   recurrent  . Chronic fatigue 02/14/2016  . Dysuria 03/04/2017  . Encounter for antineoplastic immunotherapy 02/14/2016  . Foot pain 09/02/2011  . Glaucoma   . Hip pain 09/02/2011  . Hypercholesterolemia   . Hypertension   . Hypertension 10/29/2016  . Hypothyroidism   . Lung cancer ([place]) 03/08/2011   recurrent    Past Surgical History:  Procedure Laterality Date  . LUNG LOBECTOMY  09/28/2009   right upper  . THORACOTOMY  09/28/2009   mini  . VIDEO ASSISTED THORACOSCOPY  09/28/2009    There were no vitals filed for this visit.      Subjective Assessment - 06/09/17 1529    Subjective They told me I need to see a neurologist and get an MRI and I just don't understand. I got confused about my walker because it says for home use only so I have not gotten it yet because I don't  have a lot of room in my house. I am having this fluid on my knees and I don't know why.    Pertinent History recurrent non-small cell lung cancer, adenocarcinoma initially diagnosed as stage IB (T2a, N0, M0) in December 2010 now stage IV, history of acute pulmonary embolism in the right lower lobe lobe are, segmental and subsegmental pulmonary arteries diagnosed in January 2013, Status post right  upper lobectomy on September 28, 2009 under the care of Dr. Arlyce Dice. The patient refused adjuvant chemotherapy at that time. She had disease recurrence in June of 2012,Status post 4 cycles of systemic chemotherapy with carboplatin for an AUC of 6 and paclitaxel at 200 mg per meter squared and Avastin at 15 mg/kg, Chemotherapy with Avastin 15 mg/kg, status post 32 cycles. Last cycle was given on 06/30/2013 discontinued secondary to disease progression and persistent proteinuria,Tarceva 150 mg by mouth daily, started 08/09/2013, status post 10 months of treatment,Tarceva 100 mg by mouth daily started 07/02/2014, status post 19 months, discontinued secondary to disease progression, Current therapy: Immunotherapy with Nivolumab 240 mg IV every 2 weeks status post 31 cycles. First dose was given 02/20/2016   Patient Stated Goals to be able to walk again and to be independent and stay at home   Currently in Pain? Yes   Pain Score 5    Pain Location Knee   Pain Orientation Right   Pain Descriptors / Indicators Aching                         [facility] Adult PT Treatment/Exercise - 06/09/17 0001      Knee/Hip Exercises: Seated   Long Arc Quad Strengthening;Both;10 reps;Weights  more difficult on L   Long Arc Quad Weight 1 lbs.   Knee/Hip Flexion  seated marching x 10 reps each with 1 lb ankle weights   Marching Limitations much more difficulty on L     Knee/Hip Exercises: Supine   Quad Sets Strengthening;Both;10 reps  with 5 sec holds   Short Arc Quad Sets Strengthening;Both;10 reps  with 1 lb ankle weight and 3 sec hold   Heel Slides Strengthening;Both;10 reps  with 1 lb ankle weights   Hip Adduction Isometric Strengthening;Both;10 reps  with 3 sec squeezes on ball   Straight Leg Raises Strengthening;Both;10 reps  with 1 lb ankle weights   Other Supine Knee/Hip Exercises bridges x 10 reps                   Short Term Clinic Goals - 05/30/17 1233      CC Short Term Goal   #1   Title Pt to demonstrate 3+/5 R ankle dorsiflexion strength to decrease fall risk   Baseline 3/5   Time 4   Period Weeks   Status New   Target Date 06/27/17     CC Short Term Goal  #2   Title P to demonstrate left hip flexor strength of 3+/5 to decrease risk of falls.   Baseline 3/5   Time 4   Period Weeks   Status New   Target Date 06/27/17     CC Short Term Goal  #3   Title Pt to obtain a front wheeled rolling walker to use in community to decrease fall risk.   Baseline pt currently using straight cane   Time 4   Period Weeks   Status New   Target Date 06/27/17     CC Short Term Goal  #4   Title Pt to report no falls over the next 4 weeks to decrease risk of hospitilization   Baseline 5 falls in 2 weeks   Time 4   Period Weeks   Status New   Target Date 06/27/17             Long Term Clinic Goals - 05/30/17 1234      CC Long Term Goal  #1   Title Pt to demonstrate bilateral hip flexor strength of 4+/5 to decrease risk of falls   Baseline 3+/5 R, 3/5 L    Time 8   Period Weeks   Status New   Target Date 07/25/17     CC Long Term Goal  #2   Title Pt to demonstrate 4+/5 bilateral ankle dorsiflexion strength to decrease risk of falls.   Baseline 3/5 R, 3+/5 L   Time 8   Period Weeks   Status New   Target Date 07/25/17     CC Long Term Goal  #3   Title Pt to demonstrate 4+/5 bilateral hamstring strength to decrease risk of falls.   Baseline 3+/5 R, 3/5 left   Time 8   Period Weeks   Status New   Target Date 07/25/17     CC Long Term Goal  #4   Title Pt to be independent in a home exercise program for continued strengthening and stretching   Time 8   Period Weeks   Status New   Target Date 07/25/17            Plan - 06/09/17 1610    Clinical Impression Statement Pt is getting a walker this afternoon. She states the walker Rx states home use only but educated pt that as long as she has a caregiver to put it in  and out of the car she  should bring it to her appointments. Also educated pt to not use on grass where she can not see holes. Pt was educated on the importance of seeing neurologist and the benefits of MRI to help diagnose her left LE weakness. Pt plans on making appointment with neurologist as her PCP suggested. Focused on supine and seated strengthening exericses today.    Rehab Potential Fair   Clinical Impairments Affecting Rehab Potential stage IV cancer, high fall risk, pt lives alone   PT Frequency 2x / week   PT Duration 8 weeks   PT Treatment/Interventions ADLs/Self Care Home Management;Therapeutic activities;Therapeutic exercise;Neuromuscular re-education;Patient/family education;Manual techniques;Gait training;Stair training;Fluidtherapy   PT Next Visit Plan do BERG and gait speed and add goals for those, begin supine LE strengthening exercises- monitor O2 and shortness of breath   Consulted and Agree with Plan of Care Patient      Patient will benefit from skilled therapeutic intervention in order to improve the following deficits and impairments:  Abnormal gait, Decreased endurance, Decreased activity tolerance, Pain, Decreased balance, Decreased knowledge of use of DME, Decreased mobility, Decreased strength, Impaired sensation  Visit Diagnosis: Muscle weakness (generalized)  Other abnormalities of gait and mobility     Problem List Patient Active Problem List   Diagnosis Date Noted  . Muscle weakness (generalized) 06/06/2017  . Multiple falls 06/06/2017  . DDD (degenerative disc disease), lumbosacral 05/15/2017  . Incontinence of feces 04/23/2017  . Vaginal atrophy 04/23/2017  . Voiding dysfunction 04/23/2017  . Recurrent UTI 04/23/2017  . Dysuria 03/04/2017  . Hypertension 10/29/2016  . Encounter for antineoplastic immunotherapy 02/14/2016  . Chronic fatigue 02/14/2016  . Port catheter in place 02/09/2016  . Urine frequency 07/06/2014  . Diarrhea 05/24/2014  . Nausea and vomiting  05/24/2014  . Sepsis ([place]) 05/24/2014  . Hypothermia 05/24/2014  . Hypokalemia 05/24/2014  . Nausea vomiting and diarrhea 05/24/2014  . Proteinuria 04/28/2012  . Vitamin B12 deficiency 03/17/2012  . Unspecified deficiency anemia 12/02/2011  . 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 Multinodular goiter 11/29/2011  . Numbness 11/29/2011  . Acute pulmonary embolism ([place]) 11/04/2011  . Acute pulmonary embolism ([place]) 11/04/2011  . Hip pain 09/02/2011  . Foot pain 09/02/2011  . Bilateral lung cancer ([place]) 03/08/2011    Allyson Sabal [facility] 06/09/2017, 4:13 PM  [place], [place], [address] Phone: [phone]   Fax:  [phone]  Name: [name] MRN: [id number] Date of Birth: 07-11-1941  Manus Gunning, PT 06/09/17 4:13 PM

## 2017-06-10 ENCOUNTER — Ambulatory Visit (HOSPITAL_BASED_OUTPATIENT_CLINIC_OR_DEPARTMENT_OTHER): Payer: Medicare Other | Admitting: Oncology

## 2017-06-10 ENCOUNTER — Encounter: Payer: Self-pay | Admitting: Oncology

## 2017-06-10 ENCOUNTER — Other Ambulatory Visit (HOSPITAL_BASED_OUTPATIENT_CLINIC_OR_DEPARTMENT_OTHER): Payer: Medicare Other

## 2017-06-10 ENCOUNTER — Ambulatory Visit (HOSPITAL_BASED_OUTPATIENT_CLINIC_OR_DEPARTMENT_OTHER): Payer: Medicare Other

## 2017-06-10 ENCOUNTER — Telehealth: Payer: Self-pay

## 2017-06-10 VITALS — BP 105/72 | HR 73 | Temp 97.8°F | Resp 18 | Wt 123.3 lb

## 2017-06-10 DIAGNOSIS — C3492 Malignant neoplasm of unspecified part of left bronchus or lung: Secondary | ICD-10-CM | POA: Diagnosis not present

## 2017-06-10 DIAGNOSIS — C3491 Malignant neoplasm of unspecified part of right bronchus or lung: Secondary | ICD-10-CM

## 2017-06-10 DIAGNOSIS — R531 Weakness: Secondary | ICD-10-CM

## 2017-06-10 DIAGNOSIS — Z86711 Personal history of pulmonary embolism: Secondary | ICD-10-CM | POA: Diagnosis not present

## 2017-06-10 DIAGNOSIS — Z5112 Encounter for antineoplastic immunotherapy: Secondary | ICD-10-CM | POA: Diagnosis not present

## 2017-06-10 LAB — COMPREHENSIVE METABOLIC PANEL
ALT: 10 U/L (ref 0–55)
ANION GAP: 8 meq/L (ref 3–11)
AST: 13 U/L (ref 5–34)
Albumin: 4 g/dL (ref 3.5–5.0)
Alkaline Phosphatase: 96 U/L (ref 40–150)
BUN: 15.5 mg/dL (ref 7.0–26.0)
CHLORIDE: 105 meq/L (ref 98–109)
CO2: 25 meq/L (ref 22–29)
Calcium: 10.7 mg/dL — ABNORMAL HIGH (ref 8.4–10.4)
Creatinine: 1.1 mg/dL (ref 0.6–1.1)
EGFR: 56 mL/min/{1.73_m2} — AB (ref >90)
GLUCOSE: 89 mg/dL (ref 70–140)
POTASSIUM: 4.4 meq/L (ref 3.5–5.1)
SODIUM: 138 meq/L (ref 136–145)
Total Bilirubin: 0.42 mg/dL (ref 0.20–1.20)
Total Protein: 7.6 g/dL (ref 6.4–8.3)

## 2017-06-10 LAB — CBC WITH DIFFERENTIAL/PLATELET
BASO%: 1.3 % (ref 0.0–2.0)
Basophils Absolute: 0.1 10*3/uL (ref 0.0–0.1)
EOS ABS: 0.2 10*3/uL (ref 0.0–0.5)
EOS%: 1.7 % (ref 0.0–7.0)
HCT: 35.7 % (ref 34.8–46.6)
HGB: 12 g/dL (ref 11.6–15.9)
LYMPH%: 17.2 % (ref 14.0–49.7)
MCH: 30.1 pg (ref 25.1–34.0)
MCHC: 33.6 g/dL (ref 31.5–36.0)
MCV: 89.4 fL (ref 79.5–101.0)
MONO#: 0.5 10*3/uL (ref 0.1–0.9)
MONO%: 5.4 % (ref 0.0–14.0)
NEUT%: 74.4 % (ref 38.4–76.8)
NEUTROS ABS: 6.9 10*3/uL — AB (ref 1.5–6.5)
PLATELETS: 277 10*3/uL (ref 145–400)
RBC: 4 10*6/uL (ref 3.70–5.45)
RDW: 15.6 % — ABNORMAL HIGH (ref 11.2–14.5)
WBC: 9.3 10*3/uL (ref 3.9–10.3)
lymph#: 1.6 10*3/uL (ref 0.9–3.3)

## 2017-06-10 MED ORDER — SODIUM CHLORIDE 0.9% FLUSH
10.0000 mL | INTRAVENOUS | Status: DC | PRN
  Administered 2017-06-10: 10 mL
  Filled 2017-06-10: qty 10

## 2017-06-10 MED ORDER — SODIUM CHLORIDE 0.9 % IV SOLN
Freq: Once | INTRAVENOUS | Status: AC
  Administered 2017-06-10: 13:00:00 via INTRAVENOUS

## 2017-06-10 MED ORDER — HEPARIN SOD (PORK) LOCK FLUSH 100 UNIT/ML IV SOLN
500.0000 [IU] | Freq: Once | INTRAVENOUS | Status: AC | PRN
  Administered 2017-06-10: 500 [IU]
  Filled 2017-06-10: qty 5

## 2017-06-10 MED ORDER — SODIUM CHLORIDE 0.9 % IV SOLN
240.0000 mg | Freq: Once | INTRAVENOUS | Status: AC
  Administered 2017-06-10: 240 mg via INTRAVENOUS
  Filled 2017-06-10: qty 24

## 2017-06-10 NOTE — Patient Instructions (Signed)
[facility] Discharge Instructions for Patients Receiving Chemotherapy  Today you received the following chemotherapy agents:  Opdivo  To help prevent nausea and vomiting after your treatment, we encourage you to take your nausea medication as prescribed.   If you develop nausea and vomiting that is not controlled by your nausea medication, call the clinic.   BELOW ARE SYMPTOMS THAT SHOULD BE REPORTED IMMEDIATELY:  *FEVER GREATER THAN 100.5 F  *CHILLS WITH OR WITHOUT FEVER  NAUSEA AND VOMITING THAT IS NOT CONTROLLED WITH YOUR NAUSEA MEDICATION  *UNUSUAL SHORTNESS OF BREATH  *UNUSUAL BRUISING OR BLEEDING  TENDERNESS IN MOUTH AND THROAT WITH OR WITHOUT PRESENCE OF ULCERS  *URINARY PROBLEMS  *BOWEL PROBLEMS  UNUSUAL RASH Items with * indicate a potential emergency and should be followed up as soon as possible.  Feel free to call the clinic you have any questions or concerns. The clinic phone number is [phone].  Please show the CHEMO ALERT CARD at check-in to the Emergency Department and triage nurse.   

## 2017-06-10 NOTE — Telephone Encounter (Signed)
Telephone encounter opened in error [facility], PT 06/10/17 11:07 AM

## 2017-06-10 NOTE — Assessment & Plan Note (Addendum)
This is a very pleasant 76 year old African-American female with recurrent non-small cell lung cancer, adenocarcinoma status post several chemotherapy regimens and currently on treatment with immunotherapy with Nivolumab status post 32 cycles. The patient has been tolerating her treatment well. I recommended for the patient to continue her current treatment with immunotherapy and she will proceed with cycle #33 today as scheduled.  Follow-up visit in 2 weeks for evaluation with the next cycle of her treatment.  For her ongoing weakness, I have encouraged her to make an appointment with neurology as recommended by her primary care provider.  Referral place to social work per the patient request due to needs for transportation and lack of social support.  She was advised to call immediately if she has any concerning symptoms in the interval.

## 2017-06-10 NOTE — Progress Notes (Signed)
[facility] Follow up:    Harrison Mons, PA-C [address] [place] [address]   DIAGNOSIS: Cancer Staging Bilateral lung cancer ([place]) Staging form: Lung, AJCC 7th Edition - Clinical: Stage IV (T1b, NX, M1b, Free text: metastatic non-small cell lung cancer, adenocarcinoma) - Signed by Curt Bears, MD on 02/15/2014 1) recurrent non-small cell lung cancer, adenocarcinoma initially diagnosed as stage IB (T2a, N0, M0) in December 2010. 2) history of acute pulmonary embolism in the right lower lobe lobe are, segmental and subsegmental pulmonary arteries diagnosed in January 2013.  SUMMARY OF ONCOLOGIC HISTORY:  No history exists.   PRIOR THERAPY: 1.Status post right upper lobectomy on September 28, 2009 under the care of Dr. Arlyce Dice. The patient refused adjuvant chemotherapy at that time. She had disease recurrence in June of 2012.  2.Status post 4 cycles of systemic chemotherapy with carboplatin for an AUC of 6 and paclitaxel at 200 mg per meter squared and Avastin at 15 mg/kg according to the ECOG protocol #5508.  3. Chemotherapy with Avastin 15 mg/kg according to the ECOG protocol #5508 status post 32 cycles. Last cycle was given on 06/30/2013 discontinued secondary to disease progression and persistent proteinuria. 4. Tarceva 150 mg by mouth daily, started 08/09/2013, status post 10 months of treatment. 5. Tarceva 100 mg by mouth daily started 07/02/2014, status post 19 months, discontinued secondary to disease progression.  CURRENT THERAPY: Immunotherapy with Nivolumab 240 mg IV every 2 weeks status post 32 cycles. First dose was given 02/20/2016.  INTERVAL HISTORY: [name] 76 y.o. female returns for routine follow-up by herself. The patient is feeling fine today except for ongoing weakness in her lower extremities. She is working with physical therapy. She reports having a fall last week without injury. Patient states that her primary care provider  has referred her to neurology, but she has not made an appointment with them. There is also been discussion about a possible MRI of the lumbar spine, but the patient wants to talk to neurology before scheduling this. Her appetite has been fair and her weight has remained stable. She denies fevers and chills. Denies chest pain, shortness of breath, cough, hemoptysis. She denies nausea, vomiting, diarrhea, constipation. No rashes. The patient is here for evaluation prior to cycle 33 of her treatment.   Patient Active Problem List   Diagnosis Date Noted  . Muscle weakness (generalized) 06/06/2017  . Multiple falls 06/06/2017  . DDD (degenerative disc disease), lumbosacral 05/15/2017  . Incontinence of feces 04/23/2017  . Vaginal atrophy 04/23/2017  . Voiding dysfunction 04/23/2017  . Recurrent UTI 04/23/2017  . Dysuria 03/04/2017  . Hypertension 10/29/2016  . Encounter for antineoplastic immunotherapy 02/14/2016  . Chronic fatigue 02/14/2016  . Port catheter in place 02/09/2016  . Urine frequency 07/06/2014  . Diarrhea 05/24/2014  . Nausea and vomiting 05/24/2014  . Sepsis ([place]) 05/24/2014  . Hypothermia 05/24/2014  . Hypokalemia 05/24/2014  . Nausea vomiting and diarrhea 05/24/2014  . Proteinuria 04/28/2012  . Vitamin B12 deficiency 03/17/2012  . Unspecified deficiency anemia 12/02/2011  . 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 Multinodular goiter 11/29/2011  . Numbness 11/29/2011  . Acute pulmonary embolism ([place]) 11/04/2011  . Acute pulmonary embolism ([place]) 11/04/2011  . Hip pain 09/02/2011  . Foot pain 09/02/2011  . Bilateral lung cancer ([place]) 03/08/2011    is allergic to cymbalta [duloxetine hcl]; fish allergy; amitriptyline; codeine; mirabegron; solifenacin; and sulfa antibiotics.  MEDICAL HISTORY: Past Medical History:  Diagnosis Date  . Bilateral  lung cancer ([place]) 03/08/2011   recurrent  . Chronic fatigue 02/14/2016  . Dysuria 03/04/2017  . Encounter for  antineoplastic immunotherapy 02/14/2016  . Foot pain 09/02/2011  . Glaucoma   . Hip pain 09/02/2011  . Hypercholesterolemia   . Hypertension   . Hypertension 10/29/2016  . Hypothyroidism   . Lung cancer ([place]) 03/08/2011   recurrent    SURGICAL HISTORY: Past Surgical History:  Procedure Laterality Date  . LUNG LOBECTOMY  09/28/2009   right upper  . THORACOTOMY  09/28/2009   mini  . VIDEO ASSISTED THORACOSCOPY  09/28/2009    SOCIAL HISTORY: Social History   Social History  . Marital status: Widowed    Spouse name: N/A  . Number of children: N/A  . Years of education: N/A   Occupational History  . Retired    Social History Main Topics  . Smoking status: Former Smoker    Packs/day: 1.00    Years: 50.00    Types: Cigarettes    Quit date: 09/28/2009  . Smokeless tobacco: Never Used  . Alcohol use No  . Drug use: No  . Sexual activity: No   Other Topics Concern  . Not on file   Social History Narrative   Lives alone with her dog. Daughters live in [place] and [place], [place].    FAMILY HISTORY: Family History  Problem Relation Age of Onset  . Stroke Father   . Heart disease Father 90       MI    Review of Systems  Constitutional: Positive for fatigue. Negative for appetite change, chills, fever and unexpected weight change.  HENT:  Negative.   Eyes: Negative.   Respiratory: Negative.   Cardiovascular: Negative.   Gastrointestinal: Negative.   Genitourinary: Negative.    Musculoskeletal:       Weakness to her bilateral lower extremities.  Skin: Negative.   Neurological: Negative.   Hematological: Negative.   Psychiatric/Behavioral: Negative.      PHYSICAL EXAMINATION  ECOG PERFORMANCE STATUS: 2 - Symptomatic, <50% confined to bed  Vitals:   06/10/17 1124  BP: 105/72  Pulse: 73  Resp: 18  Temp: 97.8 F (36.6 C)  SpO2: 100%    Physical Exam  Constitutional: She is oriented to person, place, and time and well-developed, well-nourished, and  in no distress. No distress.  HENT:  Head: Normocephalic.  Mouth/Throat: Oropharynx is clear and moist. No oropharyngeal exudate.  Eyes: Conjunctivae are normal. Right eye exhibits no discharge. Left eye exhibits no discharge. No scleral icterus.  Neck: Normal range of motion. Neck supple.  Cardiovascular: Normal rate, regular rhythm, normal heart sounds and intact distal pulses.   Pulmonary/Chest: Effort normal and breath sounds normal. No respiratory distress. She has no wheezes. She has no rales.  Abdominal: Soft. Bowel sounds are normal. She exhibits no distension and no mass. There is no tenderness.  Musculoskeletal: Normal range of motion. She exhibits no edema.  Lymphadenopathy:    She has no cervical adenopathy.  Neurological: She is alert and oriented to person, place, and time.  Skin: Skin is warm and dry. No rash noted. She is not diaphoretic. No erythema. No pallor.  Psychiatric: Mood, memory, affect and judgment normal.  Vitals reviewed.   LABORATORY DATA:  CBC    Component Value Date/Time   WBC 9.3 06/10/2017 1059   WBC 12.2 (H) 01/11/2015 1544   RBC 4.00 06/10/2017 1059   RBC 4.35 01/11/2015 1544   HGB 12.0 06/10/2017 1059   HCT 35.7 06/10/2017  1059   PLT 277 06/10/2017 1059   MCV 89.4 06/10/2017 1059   MCH 30.1 06/10/2017 1059   MCH 28.5 01/11/2015 1544   MCHC 33.6 06/10/2017 1059   MCHC 31.7 01/11/2015 1544   RDW 15.6 (H) 06/10/2017 1059   LYMPHSABS 1.6 06/10/2017 1059   MONOABS 0.5 06/10/2017 1059   EOSABS 0.2 06/10/2017 1059   BASOSABS 0.1 06/10/2017 1059    CMP     Component Value Date/Time   NA 138 06/10/2017 1059   K 4.4 06/10/2017 1059   CL 105 01/11/2015 1603   CL 107 03/30/2013 1036   CO2 25 06/10/2017 1059   GLUCOSE 89 06/10/2017 1059   GLUCOSE 83 03/30/2013 1036   BUN 15.5 06/10/2017 1059   CREATININE 1.1 06/10/2017 1059   CALCIUM 10.7 (H) 06/10/2017 1059   PROT 7.6 06/10/2017 1059   ALBUMIN 4.0 06/10/2017 1059   AST 13 06/10/2017  1059   ALT 10 06/10/2017 1059   ALKPHOS 96 06/10/2017 1059   BILITOT 0.42 06/10/2017 1059   GFRNONAA 46 (L) 05/25/2014 0332   GFRAA 53 (L) 05/25/2014 0332    RADIOGRAPHIC STUDIES:  No results found.  ASSESSMENT and THERAPY PLAN:   Bilateral lung cancer ([place]) This is a very pleasant 76 year old African-American female with recurrent non-small cell lung cancer, adenocarcinoma status post several chemotherapy regimens and currently on treatment with immunotherapy with Nivolumab status post 32 cycles. The patient has been tolerating her treatment well. I recommended for the patient to continue her current treatment with immunotherapy and she will proceed with cycle #33 today as scheduled.  Follow-up visit in 2 weeks for evaluation with the next cycle of her treatment.  For her ongoing weakness, I have encouraged her to make an appointment with neurology as recommended by her primary care provider.  Referral place to social work per the patient request due to needs for transportation and lack of social support.  She was advised to call immediately if she has any concerning symptoms in the interval.    Orders Placed This Encounter  Procedures  . Ambulatory referral to Social Work    Referral Priority:   Routine    Referral Type:   Consultation    Referral Reason:   Specialty Services Required    Number of Visits Requested:   1    All questions were answered. The patient knows to call the clinic with any problems, questions or concerns. We can certainly see the patient much sooner if necessary.  Mikey Bussing, NP 06/10/2017

## 2017-06-10 NOTE — Telephone Encounter (Signed)
Left voice message for SW with patient (name, MR#, and physician information)

## 2017-06-13 ENCOUNTER — Ambulatory Visit: Payer: Medicare Other | Admitting: Physical Therapy

## 2017-06-13 ENCOUNTER — Encounter: Payer: Self-pay | Admitting: Physical Therapy

## 2017-06-13 DIAGNOSIS — M6281 Muscle weakness (generalized): Secondary | ICD-10-CM | POA: Diagnosis not present

## 2017-06-13 DIAGNOSIS — R2689 Other abnormalities of gait and mobility: Secondary | ICD-10-CM

## 2017-06-13 NOTE — Therapy (Signed)
[facility] [place], [place], [address] Phone: [phone]   Fax:  [phone]  Physical Therapy Treatment  Patient Details  Name: [name] MRN: [id number] Date of Birth: 1941/01/25 Referring Provider: Harrison Mons  Encounter Date: 06/13/2017      PT End of Session - 06/13/17 1017    Visit Number 4   Number of Visits 17   Date for PT Re-Evaluation 07/25/17   PT Start Time 0928   PT Stop Time 1016   PT Time Calculation (min) 48 min   Activity Tolerance Patient limited by pain   Behavior During Therapy [facility] for tasks assessed/performed      Past Medical History:  Diagnosis Date  . Bilateral lung cancer ([place]) 03/08/2011   recurrent  . Chronic fatigue 02/14/2016  . Dysuria 03/04/2017  . Encounter for antineoplastic immunotherapy 02/14/2016  . Foot pain 09/02/2011  . Glaucoma   . Hip pain 09/02/2011  . Hypercholesterolemia   . Hypertension   . Hypertension 10/29/2016  . Hypothyroidism   . Lung cancer ([place]) 03/08/2011   recurrent    Past Surgical History:  Procedure Laterality Date  . LUNG LOBECTOMY  09/28/2009   right upper  . THORACOTOMY  09/28/2009   mini  . VIDEO ASSISTED THORACOSCOPY  09/28/2009    There were no vitals filed for this visit.      Subjective Assessment - 06/13/17 0938    Subjective I got my walker. My hands have been red and where they have been numb at the tips of my fingers it is now half of the hand. My leg has been hurting worse especially when I am walking. The last two days it has gotten worse. For a while when I first took the pill I could tell it felt better but later on in the day it felt worse.    Pertinent History recurrent non-small cell lung cancer, adenocarcinoma initially diagnosed as stage IB (T2a, N0, M0) in December 2010 now stage IV, history of acute pulmonary embolism in the right lower lobe lobe are, segmental and subsegmental pulmonary arteries diagnosed  in January 2013, Status post right upper lobectomy on September 28, 2009 under the care of Dr. Arlyce Dice. The patient refused adjuvant chemotherapy at that time. She had disease recurrence in June of 2012,Status post 4 cycles of systemic chemotherapy with carboplatin for an AUC of 6 and paclitaxel at 200 mg per meter squared and Avastin at 15 mg/kg, Chemotherapy with Avastin 15 mg/kg, status post 32 cycles. Last cycle was given on 06/30/2013 discontinued secondary to disease progression and persistent proteinuria,Tarceva 150 mg by mouth daily, started 08/09/2013, status post 10 months of treatment,Tarceva 100 mg by mouth daily started 07/02/2014, status post 19 months, discontinued secondary to disease progression, Current therapy: Immunotherapy with Nivolumab 240 mg IV every 2 weeks status post 31 cycles. First dose was given 02/20/2016   Patient Stated Goals to be able to walk again and to be independent and stay at home   Currently in Pain? Yes   Pain Score 8    Pain Location Leg   Pain Orientation Right   Pain Descriptors / Indicators Aching   Pain Type Chronic pain   Pain Onset More than a month ago   Pain Frequency Constant            [facility] PT Assessment - 06/13/17 0001      Standardized Balance Assessment   Standardized Balance Assessment Berg Balance Test  Berg Balance Test   Sit to Stand Needs moderate or maximal assist to stand   Standing Unsupported Able to stand safely 2 minutes   Sitting with Back Unsupported but Feet Supported on Floor or Stool Able to sit safely and securely 2 minutes   Stand to Sit Sits independently, has uncontrolled descent   Transfers Able to transfer safely, definite need of hands   Standing Unsupported with Eyes Closed Able to stand 10 seconds with supervision   Standing Ubsupported with Feet Together Able to place feet together independently and stand for 1 minute with supervision   From Standing, Reach Forward with Outstretched Arm Can reach forward  >5 cm safely (2")   From Standing Position, Pick up Object from Floor Unable to try/needs assist to keep balance   From Standing Position, Turn to Look Behind Over each Shoulder Needs supervision when turning   Turn 360 Degrees Able to turn 360 degrees safely but slowly   Standing Unsupported, Alternately Place Feet on Step/Stool Able to complete >2 steps/needs minimal assist   Standing Unsupported, One Foot in [employer] balance while stepping or standing   Standing on One Leg Unable to try or needs assist to prevent fall   Total Score 24                             PT Education - 06/13/17 1018    Education provided Yes   Education Details importance of following up with doctor regarding RLE pain that is not decreasing with pain meds and is interfering with pt's ability to participate in therapy, how to use rolling walker, how to transfer to rolling walker   Person(s) Educated Patient   Methods Explanation;Handout   Comprehension Verbalized understanding           Short Term Clinic Goals - 06/13/17 0941      CC Short Term Goal  #1   Title Pt to demonstrate 3+/5 R ankle dorsiflexion strength to decrease fall risk   Baseline 3/5   Time 4   Period Weeks   Status On-going     CC Short Term Goal  #2   Title P to demonstrate left hip flexor strength of 3+/5 to decrease risk of falls.   Baseline 3/5   Time 4   Period Weeks   Status On-going     CC Short Term Goal  #3   Title Pt to obtain a front wheeled rolling walker to use in community to decrease fall risk.   Baseline pt currently using straight cane, 06/13/17- pt obtained a two wheeled walker   Time 4   Period Weeks   Status Achieved     CC Short Term Goal  #4   Title Pt to report no falls over the next 4 weeks to decrease risk of hospitilization   Baseline 5 falls in 2 weeks, 06/13/17- pt states she now has had 6 falls since July 28th- 1 fall since she started therapy   Time 4   Period Weeks    Status On-going             Long Term Clinic Goals - 06/13/17 1200      CC Long Term Goal  #5   Title Pt will improve score of BERG to a 44/56 to allow pt to return to using a cane for indoor ambulation and decrease fall risk.   Baseline 24   Time 4  Period Weeks   Status New            Plan - 06/13/17 1156    Clinical Impression Statement Assessed pt's balance today using BERG balance scale. Pt only score a 24 putting her at a 100% risk of falls. Pt continues to have RLE pain. It is affecting pt's ability to participate in therapy. She was able to complete BERG today with numerous seated recovery periods. O2 sats stayed in the 90s. She was encouraged to make an appointment with her neurologist to follow up on her back. She was also educated on importance of possibly getting imaging of her right hip and thigh to rule out any injury sustained in her numerous falls. Wrote out this information for pt so she can follow up with the proper doctors. She plans on making appointment with neurologist this afternoon. Will add goal for balance.    Rehab Potential Fair   Clinical Impairments Affecting Rehab Potential stage IV cancer, high fall risk, pt lives alone   PT Frequency 2x / week   PT Duration 8 weeks   PT Treatment/Interventions ADLs/Self Care Home Management;Therapeutic activities;Therapeutic exercise;Neuromuscular re-education;Patient/family education;Manual techniques;Gait training;Stair training;Fluidtherapy   PT Next Visit Plan ask about appt with neurologist and imaging for R hip/thigh, if pain still not improving with pt's new pain meds may need to message dr, continue supine LE strengthening exercises- monitor O2 and shortness of breath   Consulted and Agree with Plan of Care Patient      Patient will benefit from skilled therapeutic intervention in order to improve the following deficits and impairments:  Abnormal gait, Decreased endurance, Decreased activity tolerance,  Pain, Decreased balance, Decreased knowledge of use of DME, Decreased mobility, Decreased strength, Impaired sensation  Visit Diagnosis: Muscle weakness (generalized)  Other abnormalities of gait and mobility     Problem List Patient Active Problem List   Diagnosis Date Noted  . Muscle weakness (generalized) 06/06/2017  . Multiple falls 06/06/2017  . DDD (degenerative disc disease), lumbosacral 05/15/2017  . Incontinence of feces 04/23/2017  . Vaginal atrophy 04/23/2017  . Voiding dysfunction 04/23/2017  . Recurrent UTI 04/23/2017  . Dysuria 03/04/2017  . Hypertension 10/29/2016  . Encounter for antineoplastic immunotherapy 02/14/2016  . Chronic fatigue 02/14/2016  . Port catheter in place 02/09/2016  . Urine frequency 07/06/2014  . Diarrhea 05/24/2014  . Nausea and vomiting 05/24/2014  . Sepsis ([place]) 05/24/2014  . Hypothermia 05/24/2014  . Hypokalemia 05/24/2014  . Nausea vomiting and diarrhea 05/24/2014  . Proteinuria 04/28/2012  . Vitamin B12 deficiency 03/17/2012  . Unspecified deficiency anemia 12/02/2011  . 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 Multinodular goiter 11/29/2011  . Numbness 11/29/2011  . Acute pulmonary embolism ([place]) 11/04/2011  . Acute pulmonary embolism ([place]) 11/04/2011  . Hip pain 09/02/2011  . Foot pain 09/02/2011  . Bilateral lung cancer ([place]) 03/08/2011    Allyson Sabal [facility] 06/13/2017, 12:01 PM  [facility] [place], [place], [address] Phone: [phone]   Fax:  [phone]  Name: [name] MRN: [id number] Date of Birth: 11-Aug-1941  Manus Gunning, PT 06/13/17 12:01 PM

## 2017-06-17 ENCOUNTER — Ambulatory Visit: Payer: Medicare Other

## 2017-06-17 ENCOUNTER — Telehealth: Payer: Self-pay | Admitting: Physician Assistant

## 2017-06-17 ENCOUNTER — Encounter: Payer: Self-pay | Admitting: Neurology

## 2017-06-17 NOTE — Telephone Encounter (Signed)
Please place referral if appropriate.

## 2017-06-17 NOTE — Telephone Encounter (Signed)
Pt is calling stating that she has changed her mind about going to rehab for her DDD.  She states that the rehab facility also needs MRI's of her spine before they get started.  Please advise if this can be done.  She has fallen a few times already this week.  [phone]

## 2017-06-17 NOTE — Telephone Encounter (Signed)
Please clarify.  She has been going to PT for her DDD.  At her last visit with me I recommended an MRI and neurology referral. She wanted to see neurology first, due to the cost of the MRI, but then declined an appointment when she was contacted. There is an appointment with neurology, Dr. Posey Pronto, on 09/19/2017.  Who needs MRIs? I do not see any notes in the record that anyone has requested scans. What rehab facility is she referencing?

## 2017-06-18 ENCOUNTER — Telehealth: Payer: Self-pay | Admitting: Physician Assistant

## 2017-06-18 DIAGNOSIS — M6281 Muscle weakness (generalized): Secondary | ICD-10-CM

## 2017-06-18 DIAGNOSIS — M5442 Lumbago with sciatica, left side: Secondary | ICD-10-CM

## 2017-06-18 DIAGNOSIS — M5441 Lumbago with sciatica, right side: Secondary | ICD-10-CM

## 2017-06-18 DIAGNOSIS — M5137 Other intervertebral disc degeneration, lumbosacral region: Secondary | ICD-10-CM

## 2017-06-18 DIAGNOSIS — R296 Repeated falls: Secondary | ICD-10-CM

## 2017-06-18 MED ORDER — LISINOPRIL 40 MG PO TABS: 40.0000 mg | ORAL_TABLET | Freq: Every day | ORAL | 3 refills | Status: DC

## 2017-06-18 MED ORDER — LEVOTHYROXINE SODIUM 88 MCG PO TABS: 88.0000 ug | ORAL_TABLET | Freq: Every day | ORAL | 2 refills | Status: DC

## 2017-06-18 NOTE — Telephone Encounter (Signed)
MRI ordered

## 2017-06-18 NOTE — Telephone Encounter (Signed)
Pt called stating that her [name] talked about her having an mri and she stated that she didn't think she would need a referral for an mri, but now she does need a referral for an mri.. If you could please place referral for mri thanks.

## 2017-06-18 NOTE — Telephone Encounter (Signed)
Call to patient to inquire about her current situation and needs from our practice.  Patient states that [name] was her primary, however, they have closed the office she went to.  She was seen in May for med check and she needs [name] to fill her Lisinopril and levothyroxine.  I looked through the notes and saw that [facility] did address patient's HTN at one visit, however, could not find a visit regarding thyroid.  I advised patient of this, but reassured her that [facility] would do her best to accommodate the patient.   We then began discussing her legs.  She states the pain is worsening in her Right leg and her PT told her that she needs a scan.  She would like [place] to order that as soon as possible at a "place she could make payments."  She can not afford "one big bill" Again I told her that I would send [name] a message and we would do our best to meet her needs if at all possible.   She states she has had 6 falls recently and once fell on her leg.  She feels like this is why she is having so much pain in her right leg.  She states that her right leg is usually her "strong" leg, but now it is getting weaker. I told the patient that I would contact [name] about this and then we would call her back.  She was happy with this.

## 2017-06-18 NOTE — Telephone Encounter (Signed)
Spoke to patient and advised her of Chelle's message.  She inquired about how she would find out about the MRI and I told her that we would call her.  She was very Patent attorney.

## 2017-06-18 NOTE — Addendum Note (Signed)
Addended by: Fara Chute on: 06/18/2017 02:33 PM   Modules accepted: Orders

## 2017-06-18 NOTE — Telephone Encounter (Signed)
There are now 2 message threads.  I have ordered the MRI, attached to the other message thread.  I have refilled the lisinopril and the levothyroxine. It looks like Dr. Julien Nordmann has been managing her thyroid. It was last checked in July, and the TSH was low. I recommend that she have it checked again when she has labs drawn next, as the dose may need to be further reduced.

## 2017-06-19 ENCOUNTER — Ambulatory Visit: Payer: Medicare Other | Attending: Physician Assistant

## 2017-06-19 DIAGNOSIS — R2689 Other abnormalities of gait and mobility: Secondary | ICD-10-CM

## 2017-06-19 DIAGNOSIS — M79604 Pain in right leg: Secondary | ICD-10-CM

## 2017-06-19 DIAGNOSIS — M79605 Pain in left leg: Secondary | ICD-10-CM | POA: Diagnosis present

## 2017-06-19 DIAGNOSIS — M545 Low back pain: Secondary | ICD-10-CM | POA: Insufficient documentation

## 2017-06-19 DIAGNOSIS — G8929 Other chronic pain: Secondary | ICD-10-CM | POA: Insufficient documentation

## 2017-06-19 DIAGNOSIS — M6281 Muscle weakness (generalized): Secondary | ICD-10-CM | POA: Diagnosis present

## 2017-06-19 NOTE — Patient Instructions (Addendum)
KNEE: Extension, Long Arc Quads - Sitting    Raise leg until knee is straight. __10_ reps per set, _4-5__ sets per day.  Seated Alternating Leg Raise (Marching)    Sit on chair. Raise bent knee and return. Repeat with other leg. Do _10__times and 4-5 times during day.   Hip Extension (Standing)    Stand with support at walker or counter. Tighten tummy and then swing right leg backward with straight knee.  Repeat _10__ times. Do _4-5__ times a day. Repeat with other leg.  FUNCTIONAL MOBILITY: Marching - Standing    Hold walker or counter. March in place by lifting left leg up, then right trying to get knee as high as hip. Alternate. _10__ reps per set, _4-5__ sets per day.  Heel Raises    Stand with support. With knees straight, raise heels off ground.  Repeat _10__ times. Do _4-5__ times a day.  WALKING  Walking is a great form of exercise to increase your strength, endurance and overall fitness.  A walking program can help you start slowly and gradually build endurance as you go.  Everyone's ability is different, so each person's starting point will be different.  You do not have to follow them exactly.  The are just samples. You should simply find out what's right for you and stick to that program.   In the beginning, you'll start off walking 2-3 times a day for short distances.  As you get stronger, you'll be walking further at just 1-2 times per day.  A. You Can Walk For A Certain Length Of Time Each Day    Walk 5 minutes 3 times per day.  Increase 2 minutes every 2 days (3 times per day).  Work up to 25-30 minutes (1-2 times per day).   Example:   Day 1-2 5 minutes 3 times per day   Day 7-8 12 minutes 2-3 times per day   Day 13-14 25 minutes 1-2 times per day  B. You Can Walk For a Certain Distance Each Day     Distance can be substituted for time.    Example:   3 trips to mailbox (at road)   3 trips to corner of block   3 trips around the  block  C. Can also go to local [employer] and use the track.    Walk for distance ____ around track  Or time ____ minutes  Please only do the exercises that your therapist has initialed and dated  Cancer Rehab [phone]

## 2017-06-19 NOTE — Therapy (Signed)
[place], [place], [address] Phone: [phone]   Fax:  [phone]  Physical Therapy Treatment  Patient Details  Name: [name] MRN: [id number] Date of Birth: 1941-06-03 Referring Provider: Harrison Mons  Encounter Date: 06/19/2017      PT End of Session - 06/19/17 1108    Visit Number 5   Number of Visits 17   Date for PT Re-Evaluation 07/25/17   PT Start Time 7989   PT Stop Time 1108   PT Time Calculation (min) 45 min   Activity Tolerance Patient tolerated treatment well   Behavior During Therapy [facility] for tasks assessed/performed      Past Medical History:  Diagnosis Date  . Bilateral lung cancer ([place]) 03/08/2011   recurrent  . Chronic fatigue 02/14/2016  . Dysuria 03/04/2017  . Encounter for antineoplastic immunotherapy 02/14/2016  . Foot pain 09/02/2011  . Glaucoma   . Hip pain 09/02/2011  . Hypercholesterolemia   . Hypertension   . Hypertension 10/29/2016  . Hypothyroidism   . Lung cancer ([place]) 03/08/2011   recurrent    Past Surgical History:  Procedure Laterality Date  . LUNG LOBECTOMY  09/28/2009   right upper  . THORACOTOMY  09/28/2009   mini  . VIDEO ASSISTED THORACOSCOPY  09/28/2009    There were no vitals filed for this visit.      Subjective Assessment - 06/19/17 1035    Subjective My walker is going okay but I don't use it out in my yard bc of the holes i nth eground. And I'm just so tired all the time. I tried to call and make an appt with Dr. Dellis Filbert but she is out of town so they are trying to get me an appt with soeone else in the office and I am waiting to hear back from them. I called a neurologist but they can't see me until December, can't remember who but I think with [place]. I'm not really hurting today, my joints, especially my hips, are just achy.    Pertinent History recurrent non-small cell lung cancer, adenocarcinoma initially diagnosed as stage IB  (T2a, N0, M0) in December 2010 now stage IV, history of acute pulmonary embolism in the right lower lobe lobe are, segmental and subsegmental pulmonary arteries diagnosed in January 2013, Status post right upper lobectomy on September 28, 2009 under the care of Dr. Arlyce Dice. The patient refused adjuvant chemotherapy at that time. She had disease recurrence in June of 2012,Status post 4 cycles of systemic chemotherapy with carboplatin for an AUC of 6 and paclitaxel at 200 mg per meter squared and Avastin at 15 mg/kg, Chemotherapy with Avastin 15 mg/kg, status post 32 cycles. Last cycle was given on 06/30/2013 discontinued secondary to disease progression and persistent proteinuria,Tarceva 150 mg by mouth daily, started 08/09/2013, status post 10 months of treatment,Tarceva 100 mg by mouth daily started 07/02/2014, status post 19 months, discontinued secondary to disease progression, Current therapy: Immunotherapy with Nivolumab 240 mg IV every 2 weeks status post 31 cycles. First dose was given 02/20/2016   Patient Stated Goals to be able to walk again and to be independent and stay at home   Currently in Pain? No/denies                         [facility] Adult PT Treatment/Exercise - 06/19/17 0001      Self-Care   Self-Care Other Self-Care Comments   Other Self-Care  Comments  Spent alot of time discussing with pt importance of "sprinkling" exercises into day and beginning a walking routine 3 times a day and documenting this. Issued handouts for all this and answered pts questions throughout.      Knee/Hip Exercises: Seated   Long Arc Quad Strengthening;Both;10 reps;Weights   Knee/Hip Flexion seated marching x 10 reps each                PT Education - 06/19/17 1101    Education provided Yes   Education Details Bil LE strength and daily walking routine   Person(s) Educated Patient   Methods Explanation;Demonstration;Handout   Comprehension Verbalized understanding;Returned  demonstration;Need further instruction           Short Term Clinic Goals - 06/13/17 0941      CC Short Term Goal  #1   Title Pt to demonstrate 3+/5 R ankle dorsiflexion strength to decrease fall risk   Baseline 3/5   Time 4   Period Weeks   Status On-going     CC Short Term Goal  #2   Title P to demonstrate left hip flexor strength of 3+/5 to decrease risk of falls.   Baseline 3/5   Time 4   Period Weeks   Status On-going     CC Short Term Goal  #3   Title Pt to obtain a front wheeled rolling walker to use in community to decrease fall risk.   Baseline pt currently using straight cane, 06/13/17- pt obtained a two wheeled walker   Time 4   Period Weeks   Status Achieved     CC Short Term Goal  #4   Title Pt to report no falls over the next 4 weeks to decrease risk of hospitilization   Baseline 5 falls in 2 weeks, 06/13/17- pt states she now has had 6 falls since July 28th- 1 fall since she started therapy   Time 4   Period Weeks   Status On-going             Long Term Clinic Goals - 06/13/17 1200      CC Long Term Goal  #5   Title Pt will improve score of BERG to a 44/56 to allow pt to return to using a cane for indoor ambulation and decrease fall risk.   Baseline 24   Time 4   Period Weeks   Status New            Plan - 06/19/17 1220    Clinical Impression Statement Pts pain was better today she was just extremely fatigued so required alot of rest breaks while exercising. Used those times to further educate pt and answer her questions regarding importance of incorporating exercises throughout her day as well as begin a walking routine so as she can begin to increase her endurance and strength. She verbalized understanding all this. She is waiting to hear back from PCP office for an appt and reports has an appt with a neurologist (with [place] per pt??). Her O2 sats were above 98 throughout session today.   Rehab Potential Fair   Clinical Impairments  Affecting Rehab Potential stage IV cancer, high fall risk, pt lives alone   PT Frequency 2x / week   PT Duration 8 weeks   PT Treatment/Interventions ADLs/Self Care Home Management;Therapeutic activities;Therapeutic exercise;Neuromuscular re-education;Patient/family education;Manual techniques;Gait training;Stair training;Fluidtherapy   PT Next Visit Plan ask about appt with neurologist and imaging for R hip/thigh, if pain still not improving  with pt's new pain meds may need to message dr, continue supine LE strengthening exercises- monitor O2 and shortness of breath   PT Home Exercise Plan HEP and walking routine issued today, see education section   Consulted and Agree with Plan of Care Patient      Patient will benefit from skilled therapeutic intervention in order to improve the following deficits and impairments:  Abnormal gait, Decreased endurance, Decreased activity tolerance, Pain, Decreased balance, Decreased knowledge of use of DME, Decreased mobility, Decreased strength, Impaired sensation  Visit Diagnosis: Muscle weakness (generalized)  Other abnormalities of gait and mobility  Chronic low back pain, unspecified back pain laterality, with sciatica presence unspecified  Pain in left leg  Pain in right leg     Problem List Patient Active Problem List   Diagnosis Date Noted  . Muscle weakness (generalized) 06/06/2017  . Multiple falls 06/06/2017  . DDD (degenerative disc disease), lumbosacral 05/15/2017  . Incontinence of feces 04/23/2017  . Vaginal atrophy 04/23/2017  . Voiding dysfunction 04/23/2017  . Recurrent UTI 04/23/2017  . Dysuria 03/04/2017  . Hypertension 10/29/2016  . Encounter for antineoplastic immunotherapy 02/14/2016  . Chronic fatigue 02/14/2016  . Port catheter in place 02/09/2016  . Urine frequency 07/06/2014  . Diarrhea 05/24/2014  . Nausea and vomiting 05/24/2014  . Sepsis ([place]) 05/24/2014  . Hypothermia 05/24/2014  . Hypokalemia 05/24/2014   . Nausea vomiting and diarrhea 05/24/2014  . Proteinuria 04/28/2012  . Vitamin B12 deficiency 03/17/2012  . Unspecified deficiency anemia 12/02/2011  . 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 Multinodular goiter 11/29/2011  . Numbness 11/29/2011  . Acute pulmonary embolism ([place]) 11/04/2011  . Acute pulmonary embolism ([place]) 11/04/2011  . Hip pain 09/02/2011  . Foot pain 09/02/2011  . Bilateral lung cancer ([place]) 03/08/2011    Otelia Limes, PTA 06/19/2017, 12:24 PM  [place], [place], [address] Phone: [phone]   Fax:  [phone]  Name: SADIE HAZELETT MRN: [id number] Date of Birth: 1941/05/06

## 2017-06-23 ENCOUNTER — Ambulatory Visit: Payer: Medicare Other

## 2017-06-23 DIAGNOSIS — M79604 Pain in right leg: Secondary | ICD-10-CM

## 2017-06-23 DIAGNOSIS — R2689 Other abnormalities of gait and mobility: Secondary | ICD-10-CM

## 2017-06-23 DIAGNOSIS — M6281 Muscle weakness (generalized): Secondary | ICD-10-CM | POA: Diagnosis not present

## 2017-06-23 DIAGNOSIS — M79605 Pain in left leg: Secondary | ICD-10-CM

## 2017-06-23 DIAGNOSIS — M545 Low back pain: Secondary | ICD-10-CM

## 2017-06-23 DIAGNOSIS — G8929 Other chronic pain: Secondary | ICD-10-CM

## 2017-06-23 NOTE — Therapy (Addendum)
[place], [place], [address] Phone: [phone]   Fax:  [phone]  Physical Therapy Treatment  Patient Details  Name: SHYANNE MCCLARY MRN: [id number] Date of Birth: August 27, 1941 Referring Provider: Harrison Mons  Encounter Date: 06/23/2017      PT End of Session - 06/23/17 1207    Visit Number 6   Number of Visits 17   Date for PT Re-Evaluation 07/25/17   PT Start Time 8110   PT Stop Time 1106   PT Time Calculation (min) 43 min   Activity Tolerance Patient tolerated treatment well   Behavior During Therapy [facility] for tasks assessed/performed      Past Medical History:  Diagnosis Date  . Bilateral lung cancer ([place]) 03/08/2011   recurrent  . Chronic fatigue 02/14/2016  . Dysuria 03/04/2017  . Encounter for antineoplastic immunotherapy 02/14/2016  . Foot pain 09/02/2011  . Glaucoma   . Hip pain 09/02/2011  . Hypercholesterolemia   . Hypertension   . Hypertension 10/29/2016  . Hypothyroidism   . Lung cancer ([place]) 03/08/2011   recurrent    Past Surgical History:  Procedure Laterality Date  . LUNG LOBECTOMY  09/28/2009   right upper  . THORACOTOMY  09/28/2009   mini  . VIDEO ASSISTED THORACOSCOPY  09/28/2009    There were no vitals filed for this visit.      Subjective Assessment - 06/23/17 1027    Subjective I felt good after our last visit, had no pain on Friday, and then the pain hit me again hard in my legs on Saturday. I just feel so weak today, my legs feel weak the longer I walk.    Pertinent History recurrent non-small cell lung cancer, adenocarcinoma initially diagnosed as stage IB (T2a, N0, M0) in December 2010 now stage IV, history of acute pulmonary embolism in the right lower lobe lobe are, segmental and subsegmental pulmonary arteries diagnosed in January 2013, Status post right upper lobectomy on September 28, 2009 under the care of Dr. Arlyce Dice. The patient refused adjuvant  chemotherapy at that time. She had disease recurrence in June of 2012,Status post 4 cycles of systemic chemotherapy with carboplatin for an AUC of 6 and paclitaxel at 200 mg per meter squared and Avastin at 15 mg/kg, Chemotherapy with Avastin 15 mg/kg, status post 32 cycles. Last cycle was given on 06/30/2013 discontinued secondary to disease progression and persistent proteinuria,Tarceva 150 mg by mouth daily, started 08/09/2013, status post 10 months of treatment,Tarceva 100 mg by mouth daily started 07/02/2014, status post 19 months, discontinued secondary to disease progression, Current therapy: Immunotherapy with Nivolumab 240 mg IV every 2 weeks status post 31 cycles. First dose was given 02/20/2016   Patient Stated Goals to be able to walk again and to be independent and stay at home   Currently in Pain? Yes   Pain Score 7    Pain Location Leg   Pain Orientation Right   Pain Descriptors / Indicators Aching   Pain Type Chronic pain   Pain Onset More than a month ago   Pain Frequency Intermittent   Aggravating Factors  walking   Pain Relieving Factors the exercises helped for a day or so                         [facility] Adult PT Treatment/Exercise - 06/23/17 0001      Knee/Hip Exercises: Standing   Heel Raises Both;2 sets;10 reps  Heel Raises Limitations with walker   Hip Flexion Stengthening;Both;10 reps;Knee bent  1 lb each ankle   Hip Flexion Limitations with walker    Other Standing Knee Exercises mini squats standing at counter top x 10      Knee/Hip Exercises: Seated   Long Arc Quad Strengthening;Both;2 sets;5 reps;Weights  SpO2 99%   Long Arc Quad Weight 1 lbs.   Ball Squeeze With small football 2 x 10   Knee/Hip Flexion seated marching x 10 reps each   Marching Limitations some difficulty with Lt   Marching Weights 1 lbs.     Knee/Hip Exercises: Supine   Short Arc Quad Sets Strengthening;Both;20 reps  1 lb each ankle with 3 sec holds   Straight Leg  Raises Strengthening;Both;10 reps  1 lb each ankle   Other Supine Knee/Hip Exercises bridges x 10 reps                   Short Term Clinic Goals - 06/13/17 0941      CC Short Term Goal  #1   Title Pt to demonstrate 3+/5 R ankle dorsiflexion strength to decrease fall risk   Baseline 3/5   Time 4   Period Weeks   Status On-going     CC Short Term Goal  #2   Title P to demonstrate left hip flexor strength of 3+/5 to decrease risk of falls.   Baseline 3/5   Time 4   Period Weeks   Status On-going     CC Short Term Goal  #3   Title Pt to obtain a front wheeled rolling walker to use in community to decrease fall risk.   Baseline pt currently using straight cane, 06/13/17- pt obtained a two wheeled walker   Time 4   Period Weeks   Status Achieved     CC Short Term Goal  #4   Title Pt to report no falls over the next 4 weeks to decrease risk of hospitilization   Baseline 5 falls in 2 weeks, 06/13/17- pt states she now has had 6 falls since July 28th- 1 fall since she started therapy   Time 4   Period Weeks   Status On-going             Long Term Clinic Goals - 06/13/17 1200      CC Long Term Goal  #5   Title Pt will improve score of BERG to a 44/56 to allow pt to return to using a cane for indoor ambulation and decrease fall risk.   Baseline 24   Time 4   Period Weeks   Status New            Plan - 06/23/17 1207    Clinical Impression Statement Pt came in reporting feeling very fatigued today and her legs weak. She was able to tolerate light exercises today though did require multiple seated rest breaks with SpO2 staying at or above 98%. She reported feeling good after last visit Thursday and had no pain Friday but it was mod-max again through the weekend and she could tolerate very little walking and/or exercise as this made her pain worse. Lt leg feels weaker to pt but her Rt leg has the most pain. Encouraged pt to talk with Dr. Julien Nordmann tomorrow as she  sees him for her infusion regarding her new, prolonged symtpoms of leg pain that is causing her increased weakness and occassional falls. Manus Gunning, PT sent Dr. Julien Nordmann an inbox  message reagrding these concerns.   Rehab Potential Fair   Clinical Impairments Affecting Rehab Potential stage IV cancer, high fall risk, pt lives alone   PT Frequency 2x / week   PT Duration 8 weeks   PT Treatment/Interventions ADLs/Self Care Home Management;Therapeutic activities;Therapeutic exercise;Neuromuscular re-education;Patient/family education;Manual techniques;Gait training;Stair training;Fluidtherapy   PT Next Visit Plan See if Dr. Julien Nordmann was able to get pt an appt with her PCP or neurologist (sooner than Dec). Cont LE strengthening exs as pt tolerates and monitoring O2/SOB prn.    Consulted and Agree with Plan of Care Patient      Patient will benefit from skilled therapeutic intervention in order to improve the following deficits and impairments:  Abnormal gait, Decreased endurance, Decreased activity tolerance, Pain, Decreased balance, Decreased knowledge of use of DME, Decreased mobility, Decreased strength, Impaired sensation  Visit Diagnosis: Muscle weakness (generalized)  Other abnormalities of gait and mobility  Chronic low back pain, unspecified back pain laterality, with sciatica presence unspecified  Pain in left leg  Pain in right leg     Problem List Patient Active Problem List   Diagnosis Date Noted  . Muscle weakness (generalized) 06/06/2017  . Multiple falls 06/06/2017  . DDD (degenerative disc disease), lumbosacral 05/15/2017  . Incontinence of feces 04/23/2017  . Vaginal atrophy 04/23/2017  . Voiding dysfunction 04/23/2017  . Recurrent UTI 04/23/2017  . Dysuria 03/04/2017  . Hypertension 10/29/2016  . Encounter for antineoplastic immunotherapy 02/14/2016  . Chronic fatigue 02/14/2016  . Port catheter in place 02/09/2016  . Urine frequency 07/06/2014  .  Diarrhea 05/24/2014  . Nausea and vomiting 05/24/2014  . Sepsis ([place]) 05/24/2014  . Hypothermia 05/24/2014  . Hypokalemia 05/24/2014  . Nausea vomiting and diarrhea 05/24/2014  . Proteinuria 04/28/2012  . Vitamin B12 deficiency 03/17/2012  . Unspecified deficiency anemia 12/02/2011  . 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 Multinodular goiter 11/29/2011  . Numbness 11/29/2011  . Acute pulmonary embolism ([place]) 11/04/2011  . Acute pulmonary embolism ([place]) 11/04/2011  . Hip pain 09/02/2011  . Foot pain 09/02/2011  . Bilateral lung cancer ([place]) 03/08/2011    Otelia Limes, PTA 06/23/2017, 12:25 PM  [place], [place], [address] Phone: [phone]   Fax:  [phone]  Name: BRILEY BUMGARNER MRN: [id number] Date of Birth: 27-Sep-1941  PHYSICAL THERAPY DISCHARGE SUMMARY  Visits from Start of Care: 6  Current functional level related to goals / functional outcomes: See above   Remaining deficits: See above   Education / Equipment: See above  Plan: Patient agrees to discharge.  Patient goals were not met. Patient is being discharged due to not returning since the last visit.  ?????     Allyson Sabal Phillips, [place] 12/03/17 3:37 PM

## 2017-06-24 ENCOUNTER — Ambulatory Visit (HOSPITAL_BASED_OUTPATIENT_CLINIC_OR_DEPARTMENT_OTHER): Payer: Medicare Other | Admitting: Internal Medicine

## 2017-06-24 ENCOUNTER — Encounter: Payer: Self-pay | Admitting: Internal Medicine

## 2017-06-24 ENCOUNTER — Ambulatory Visit (HOSPITAL_BASED_OUTPATIENT_CLINIC_OR_DEPARTMENT_OTHER): Payer: Medicare Other

## 2017-06-24 ENCOUNTER — Other Ambulatory Visit (HOSPITAL_BASED_OUTPATIENT_CLINIC_OR_DEPARTMENT_OTHER): Payer: Medicare Other

## 2017-06-24 VITALS — BP 108/61 | HR 86 | Temp 98.0°F | Resp 18 | Ht 64.0 in | Wt 120.3 lb

## 2017-06-24 DIAGNOSIS — M545 Low back pain: Secondary | ICD-10-CM | POA: Diagnosis not present

## 2017-06-24 DIAGNOSIS — E538 Deficiency of other specified B group vitamins: Secondary | ICD-10-CM

## 2017-06-24 DIAGNOSIS — R531 Weakness: Secondary | ICD-10-CM

## 2017-06-24 DIAGNOSIS — C3491 Malignant neoplasm of unspecified part of right bronchus or lung: Secondary | ICD-10-CM | POA: Diagnosis not present

## 2017-06-24 DIAGNOSIS — C3492 Malignant neoplasm of unspecified part of left bronchus or lung: Secondary | ICD-10-CM

## 2017-06-24 DIAGNOSIS — R200 Anesthesia of skin: Secondary | ICD-10-CM

## 2017-06-24 DIAGNOSIS — R5382 Chronic fatigue, unspecified: Secondary | ICD-10-CM | POA: Diagnosis not present

## 2017-06-24 DIAGNOSIS — Z5112 Encounter for antineoplastic immunotherapy: Secondary | ICD-10-CM

## 2017-06-24 DIAGNOSIS — M25552 Pain in left hip: Secondary | ICD-10-CM

## 2017-06-24 DIAGNOSIS — R634 Abnormal weight loss: Secondary | ICD-10-CM | POA: Diagnosis not present

## 2017-06-24 DIAGNOSIS — M25551 Pain in right hip: Secondary | ICD-10-CM

## 2017-06-24 LAB — COMPREHENSIVE METABOLIC PANEL
ALT: 14 U/L (ref 0–55)
AST: 15 U/L (ref 5–34)
Albumin: 4 g/dL (ref 3.5–5.0)
Alkaline Phosphatase: 96 U/L (ref 40–150)
Anion Gap: 9 mEq/L (ref 3–11)
BUN: 19.6 mg/dL (ref 7.0–26.0)
CHLORIDE: 105 meq/L (ref 98–109)
CO2: 22 meq/L (ref 22–29)
CREATININE: 1.2 mg/dL — AB (ref 0.6–1.1)
Calcium: 10.6 mg/dL — ABNORMAL HIGH (ref 8.4–10.4)
EGFR: 53 mL/min/{1.73_m2} — ABNORMAL LOW (ref >90)
GLUCOSE: 95 mg/dL (ref 70–140)
POTASSIUM: 4.9 meq/L (ref 3.5–5.1)
SODIUM: 137 meq/L (ref 136–145)
Total Bilirubin: 0.44 mg/dL (ref 0.20–1.20)
Total Protein: 7.5 g/dL (ref 6.4–8.3)

## 2017-06-24 LAB — CBC WITH DIFFERENTIAL/PLATELET
BASO%: 0.3 % (ref 0.0–2.0)
Basophils Absolute: 0 10*3/uL (ref 0.0–0.1)
EOS ABS: 0.2 10*3/uL (ref 0.0–0.5)
EOS%: 2 % (ref 0.0–7.0)
HEMATOCRIT: 36.4 % (ref 34.8–46.6)
HGB: 12 g/dL (ref 11.6–15.9)
LYMPH%: 17.4 % (ref 14.0–49.7)
MCH: 29.8 pg (ref 25.1–34.0)
MCHC: 33 g/dL (ref 31.5–36.0)
MCV: 90.3 fL (ref 79.5–101.0)
MONO#: 0.5 10*3/uL (ref 0.1–0.9)
MONO%: 5.2 % (ref 0.0–14.0)
NEUT#: 7.2 10*3/uL — ABNORMAL HIGH (ref 1.5–6.5)
NEUT%: 75.1 % (ref 38.4–76.8)
Platelets: 209 10*3/uL (ref 145–400)
RBC: 4.03 10*6/uL (ref 3.70–5.45)
RDW: 14.9 % — AB (ref 11.2–14.5)
WBC: 9.6 10*3/uL (ref 3.9–10.3)
lymph#: 1.7 10*3/uL (ref 0.9–3.3)

## 2017-06-24 MED ORDER — CYANOCOBALAMIN 1000 MCG/ML IJ SOLN
1000.0000 ug | Freq: Once | INTRAMUSCULAR | Status: AC
  Administered 2017-06-24: 1000 ug via INTRAMUSCULAR

## 2017-06-24 MED ORDER — SODIUM CHLORIDE 0.9% FLUSH
10.0000 mL | INTRAVENOUS | Status: DC | PRN
  Administered 2017-06-24: 10 mL
  Filled 2017-06-24: qty 10

## 2017-06-24 MED ORDER — SODIUM CHLORIDE 0.9 % IV SOLN
Freq: Once | INTRAVENOUS | Status: AC
  Administered 2017-06-24: 12:00:00 via INTRAVENOUS

## 2017-06-24 MED ORDER — CYANOCOBALAMIN 1000 MCG/ML IJ SOLN
INTRAMUSCULAR | Status: AC
  Filled 2017-06-24: qty 1

## 2017-06-24 MED ORDER — NIVOLUMAB CHEMO INJECTION 100 MG/10ML
240.0000 mg | Freq: Once | INTRAVENOUS | Status: AC
  Administered 2017-06-24: 240 mg via INTRAVENOUS
  Filled 2017-06-24: qty 20

## 2017-06-24 MED ORDER — HEPARIN SOD (PORK) LOCK FLUSH 100 UNIT/ML IV SOLN
500.0000 [IU] | Freq: Once | INTRAVENOUS | Status: AC | PRN
  Administered 2017-06-24: 500 [IU]
  Filled 2017-06-24: qty 5

## 2017-06-24 NOTE — Patient Instructions (Signed)
[facility] Discharge Instructions for Patients Receiving Chemotherapy  Today you received the following chemotherapy agents nivolumab   To help prevent nausea and vomiting after your treatment, we encourage you to take your nausea medication as directed   If you develop nausea and vomiting that is not controlled by your nausea medication, call the clinic.   BELOW ARE SYMPTOMS THAT SHOULD BE REPORTED IMMEDIATELY:  *FEVER GREATER THAN 100.5 F  *CHILLS WITH OR WITHOUT FEVER  NAUSEA AND VOMITING THAT IS NOT CONTROLLED WITH YOUR NAUSEA MEDICATION  *UNUSUAL SHORTNESS OF BREATH  *UNUSUAL BRUISING OR BLEEDING  TENDERNESS IN MOUTH AND THROAT WITH OR WITHOUT PRESENCE OF ULCERS  *URINARY PROBLEMS  *BOWEL PROBLEMS  UNUSUAL RASH Items with * indicate a potential emergency and should be followed up as soon as possible.  Feel free to call the clinic you have any questions or concerns. The clinic phone number is [phone].  

## 2017-06-24 NOTE — Progress Notes (Signed)
[place] [phone]   [phone]  OFFICE PROGRESS NOTE  Harrison Mons, PA-C [address] [place] [address]  DIAGNOSIS:  1) recurrent non-small cell lung cancer, adenocarcinoma initially diagnosed as stage IB (T2a, N0, M0) in December 2010. 2) history of acute pulmonary embolism in the right lower lobe lobe are, segmental and subsegmental pulmonary arteries diagnosed in January 2013.  PRIOR THERAPY: 1.Status post right upper lobectomy on September 28, 2009 under the care of Dr. Arlyce Dice. The patient refused adjuvant chemotherapy at that time. She had disease recurrence in June of 2012.  2.Status post 4 cycles of systemic chemotherapy with carboplatin for an AUC of 6 and paclitaxel at 200 mg per meter squared and Avastin at 15 mg/kg according to the ECOG protocol #5508.  3. Chemotherapy with Avastin 15 mg/kg according to the ECOG protocol #5508 status post 32 cycles. Last cycle was given on 06/30/2013 discontinued secondary to disease progression and persistent proteinuria. 4. Tarceva 150 mg by mouth daily, started 08/09/2013, status post 10 months of treatment. 5. Tarceva 100 mg by mouth daily started 07/02/2014, status post 19 months, discontinued secondary to disease progression.  CURRENT THERAPY: Immunotherapy with Nivolumab 240 mg IV every 2 weeks status post 33 cycles. First dose was given 02/20/2016.  INTERVAL HISTORY: [name] 76 y.o. female returns to the clinic today for follow-up visit. The patient is feeling fine except for the generalized fatigue and weakness in the lower extremities. She also complaining of aching pain in the lower back and knees. She has several falls recently. She also lost around 10 pounds in the last few weeks. She denied having any chest pain, shortness breath, cough or hemoptysis. She denied having any fever or chills. She has no nausea, vomiting, diarrhea or constipation. She is here today for  evaluation before starting cycle #34 her treatment with Nivolumab.   MEDICAL HISTORY: Past Medical History:  Diagnosis Date  . Bilateral lung cancer ([place]) 03/08/2011   recurrent  . Chronic fatigue 02/14/2016  . Dysuria 03/04/2017  . Encounter for antineoplastic immunotherapy 02/14/2016  . Foot pain 09/02/2011  . Glaucoma   . Hip pain 09/02/2011  . Hypercholesterolemia   . Hypertension   . Hypertension 10/29/2016  . Hypothyroidism   . Lung cancer ([place]) 03/08/2011   recurrent    ALLERGIES:  is allergic to cymbalta [duloxetine hcl]; fish allergy; amitriptyline; codeine; mirabegron; solifenacin; and sulfa antibiotics.  MEDICATIONS:  Current Outpatient Prescriptions  Medication Sig Dispense Refill  . acetaminophen (TYLENOL) 325 MG tablet Take 650 mg by mouth every 6 (six) hours as needed.    Marland Kitchen amLODipine (NORVASC) 5 MG tablet Take 10 mg by mouth daily. Per [name], NP    . aspirin 81 MG tablet Take 81 mg by mouth daily.    . cephALEXin (KEFLEX) 250 MG capsule TAKE 2 CAPSULES TWICE A DAY FOR 1 WEEK, THEN TAKE ONE CAPSULE EVERY DAY  1  . clonazePAM (KLONOPIN) 0.5 MG tablet Take 0.5 mg by mouth 2 (two) times daily as needed. Per [name], NP    . cyanocobalamin (,VITAMIN B-12,) 1000 MCG/ML injection Inject 1,000 mcg into the muscle every 30 (thirty) days.    . dorzolamide-timolol (COSOPT) 22.3-6.8 MG/ML ophthalmic solution Place 2 drops into both eyes 2 (two) times daily. Per [name], NP    . fluticasone (FLONASE) 50 MCG/ACT nasal spray Place 1 spray into both nostrils daily as needed for allergies or rhinitis.    Marland Kitchen  levothyroxine (SYNTHROID, LEVOTHROID) 88 MCG tablet Take 1 tablet (88 mcg total) by mouth daily before breakfast. 30 tablet 2  . lidocaine-prilocaine (EMLA) cream Apply 1 application topically as needed (prior to port access). 30 g 0  . lisinopril (PRINIVIL,ZESTRIL) 40 MG tablet Take 1 tablet (40 mg total) by mouth daily. 90 tablet 3  . meloxicam (MOBIC) 7.5 MG  tablet TAKE 1 TABLET BY MOUTH EVERY DAY (Patient not taking: Reported on 06/10/2017) 30 tablet 0  . PREMARIN vaginal cream INSERT BLUEBERRY SIZE AMOUNT OF ESTROGEN CREAM INTO VAGINA TWICE WEEKLY.  1  . traMADol (ULTRAM) 50 MG tablet Take 1-2 tablets (50-100 mg total) by mouth every 6 (six) hours as needed for severe pain. (Patient not taking: Reported on 06/24/2017) 30 tablet 0   No current facility-administered medications for this visit.     SURGICAL HISTORY:  Past Surgical History:  Procedure Laterality Date  . LUNG LOBECTOMY  09/28/2009   right upper  . THORACOTOMY  09/28/2009   mini  . VIDEO ASSISTED THORACOSCOPY  09/28/2009    REVIEW OF SYSTEMS:  A comprehensive review of systems was negative except for: Constitutional: positive for fatigue and weight loss Musculoskeletal: positive for back pain and muscle weakness   PHYSICAL EXAMINATION: General appearance: alert, cooperative, fatigued and no distress Head: Normocephalic, without obvious abnormality, atraumatic Neck: no adenopathy, no JVD, supple, symmetrical, trachea midline and thyroid not enlarged, symmetric, no tenderness/mass/nodules Lymph nodes: Cervical, supraclavicular, and axillary nodes normal. Resp: clear to auscultation bilaterally Back: symmetric, no curvature. ROM normal. No CVA tenderness. Cardio: regular rate and rhythm, S1, S2 normal, no murmur, click, rub or gallop GI: soft, non-tender; bowel sounds normal; no masses,  no organomegaly Extremities: extremities normal, atraumatic, no cyanosis or edema  ECOG PERFORMANCE STATUS: 1 - Symptomatic but completely ambulatory  Blood pressure 108/61, pulse 86, temperature 98 F (36.7 C), temperature source Oral, resp. rate 18, height 5\' 4"  (1.626 m), weight 120 lb 4.8 oz (54.6 kg), SpO2 100 %.  LABORATORY DATA: Lab Results  Component Value Date   WBC 9.6 06/24/2017   HGB 12.0 06/24/2017   HCT 36.4 06/24/2017   MCV 90.3 06/24/2017   PLT 209 06/24/2017       Chemistry      Component Value Date/Time   NA 137 06/24/2017 1004   K 4.9 06/24/2017 1004   CL 105 01/11/2015 1603   CL 107 03/30/2013 1036   CO2 22 06/24/2017 1004   BUN 19.6 06/24/2017 1004   CREATININE 1.2 (H) 06/24/2017 1004      Component Value Date/Time   CALCIUM 10.6 (H) 06/24/2017 1004   ALKPHOS 96 06/24/2017 1004   AST 15 06/24/2017 1004   ALT 14 06/24/2017 1004   BILITOT 0.44 06/24/2017 1004       RADIOGRAPHIC STUDIES: No results found.  ASSESSMENT AND PLAN:  This is a very pleasant 76 years old African-American female with recurrent non-small cell lung cancer, adenocarcinoma status post several chemotherapy regimens and currently on treatment with immunotherapy with Nivolumab status post 33 cycles. The patient has been tolerating her treatment well. She has been complaining of increasing fatigue and weakness as well as weakness in the lower extremities and low back pain over the last few weeks. I recommended for the patient to continue her treatment with Nivolumab but I will repeat CT scan of the chest, abdomen and pelvis for restaging of her disease and to rule out any metastatic bone disease in the lower back. For weight loss,  the patient was encouraged to increase her oral intake and I gave her the option of seeing the dietitian at the [facility].  She was advised to call immediately if she has any concerning symptoms in the interval.  The patient voices understanding of current disease status and treatment options and is in agreement with the current care plan. All questions were answered. The patient knows to call the clinic with any problems, questions or concerns. We can certainly see the patient much sooner if necessary. I spent 10 minutes counseling the patient face to face. The total time spent in the appointment was 15 minutes.  Disclaimer: This note was dictated with voice recognition software. Similar sounding words can inadvertently be transcribed and  may not be corrected upon review.

## 2017-06-25 ENCOUNTER — Encounter: Payer: Medicare Other | Admitting: Physical Therapy

## 2017-06-26 ENCOUNTER — Telehealth: Payer: Self-pay | Admitting: Internal Medicine

## 2017-06-26 ENCOUNTER — Encounter: Payer: Self-pay | Admitting: *Deleted

## 2017-06-26 NOTE — Telephone Encounter (Signed)
Scheduled appt per 9/11 los - patient to get new updated schedule next visit 9/25

## 2017-06-26 NOTE — Progress Notes (Signed)
[facility]  Clinical Social Work was referred by Futures trader for assessment of psychosocial needs.  Clinical Social Worker contacted patient by phone to offer support and assess for needs.  Ms. Ruffalo shared she lives alone and has difficulty ambulating, preparing meals, cleaning, and completing other ADLs.  The patient is interested in Assisted Living.  CSW also suggested home care services, however, the patient does not feel comfortable having someone in her home at this time.   CSW called several ALF on patient's behalf to inquire regarding pricing and availability.  CSW shared this information with the patient.  She is unsure if she is able to manage the cost of the facility, but appreciated the information.  CSW will send information via mail for patient  To follow up directly with facilities.   Maryjean Morn, MSW, LCSW, OSW-C Clinical Social Worker [facility] [phone]

## 2017-06-30 ENCOUNTER — Encounter: Payer: Medicare Other | Admitting: Physical Therapy

## 2017-06-30 ENCOUNTER — Telehealth: Payer: Self-pay

## 2017-06-30 NOTE — Telephone Encounter (Signed)
CT has PA approved.

## 2017-06-30 NOTE — Telephone Encounter (Signed)
Pt is asking if CT will also show her legs. Explained that the CT was looking at her low back. The low back can make her legs hurt or be weak.  Pt stated she appreciated the new understanding.  The legs ache and hurt and her aleve or tylenol does not last long. It last 3-4 hours. She does not want to use opioids and get addicted. She includes tramadol in this category. Educated about using heat 20 minutes at a time every 2 hours as needed.   She explained her brother passed away from lung cancer with mets to his back. She is seeing the same picture in herself and is anxious about this.   No PA seen on CT orders - inbasket to [place]. Will have central scheduling call pt.

## 2017-07-04 ENCOUNTER — Encounter: Payer: Self-pay | Admitting: Physician Assistant

## 2017-07-04 ENCOUNTER — Ambulatory Visit (INDEPENDENT_AMBULATORY_CARE_PROVIDER_SITE_OTHER): Payer: Medicare Other | Admitting: Physician Assistant

## 2017-07-04 VITALS — BP 121/69 | HR 98 | Temp 97.6°F | Resp 16 | Ht 64.96 in | Wt 117.0 lb

## 2017-07-04 DIAGNOSIS — R630 Anorexia: Secondary | ICD-10-CM

## 2017-07-04 DIAGNOSIS — R634 Abnormal weight loss: Secondary | ICD-10-CM | POA: Diagnosis not present

## 2017-07-04 DIAGNOSIS — M6281 Muscle weakness (generalized): Secondary | ICD-10-CM

## 2017-07-04 DIAGNOSIS — M5137 Other intervertebral disc degeneration, lumbosacral region: Secondary | ICD-10-CM | POA: Diagnosis not present

## 2017-07-04 DIAGNOSIS — R4589 Other symptoms and signs involving emotional state: Secondary | ICD-10-CM

## 2017-07-04 DIAGNOSIS — M5442 Lumbago with sciatica, left side: Secondary | ICD-10-CM | POA: Diagnosis not present

## 2017-07-04 DIAGNOSIS — M5441 Lumbago with sciatica, right side: Secondary | ICD-10-CM | POA: Diagnosis not present

## 2017-07-04 DIAGNOSIS — Z23 Encounter for immunization: Secondary | ICD-10-CM | POA: Diagnosis not present

## 2017-07-04 DIAGNOSIS — I10 Essential (primary) hypertension: Secondary | ICD-10-CM

## 2017-07-04 DIAGNOSIS — R946 Abnormal results of thyroid function studies: Secondary | ICD-10-CM | POA: Diagnosis not present

## 2017-07-04 DIAGNOSIS — R7989 Other specified abnormal findings of blood chemistry: Secondary | ICD-10-CM

## 2017-07-04 MED ORDER — MIRTAZAPINE 7.5 MG PO TABS: 7.5000 mg | ORAL_TABLET | Freq: Every day | ORAL | 2 refills | Status: DC

## 2017-07-04 NOTE — Progress Notes (Signed)
Patient ID: [name], female    DOB: [birth year], 76 y.o.   MRN: [id number]  PCP: Harrison Mons, PA-C  Chief Complaint  Patient presents with  . Fatigue    pt states she still feel weak and has no Appetite  . Leg Pain    still having leg pain and pt states it has not been better     Subjective:   Presents for evaluation of continued weakness and fatigue.  Recall that over the past several months, she has been falling, feeling that her legs simply cannot support her. Plain films of the thoracic and lumbar spine in July were notable for degenerative changes, but no mass or evidence of metastatic lung cancer.   At her visit with me last month we discussed the next step in evaluation. Initially she wanted to see neurology first, concerned over the potential cost of an MRI, but then changed her mind. She has not had the MRI yet, apparently confused as to whether she needed the MRI I ordered or the CT scans that Dr. Julien Nordmann ordered. She has scheduled them for the next 2 Mondays.  She has received PT to help with strengthening and balance, and has not fallen since her visit with me 8/24. She is using a walker and a cane. She describes a persistent dull ache-type pain in the legs, and has tingling in both hands and both feet, present prior to the weakness and ache in the legs. Also an ache in the low back.  Some benefit from acetaminophen, naproxen and tramadol, in reducing the leg pain, but it's temporary and does not impact the back pain.  No loss of control of her bowels or bladder.  Continues to feel depressed and lonely. Is reluctant to take medication for her mood, relating having had negative effects to "everything" in the past. She reports poor appetite, weight loss, insomnia.   Last TSH was suppressed. No levothyroxine dose change was made.  Review of Systems As above.    Patient Active Problem List   Diagnosis Date Noted  . Muscle weakness (generalized)  06/06/2017  . Multiple falls 06/06/2017  . DDD (degenerative disc disease), lumbosacral 05/15/2017  . Incontinence of feces 04/23/2017  . Vaginal atrophy 04/23/2017  . Voiding dysfunction 04/23/2017  . Recurrent UTI 04/23/2017  . Dysuria 03/04/2017  . Hypertension 10/29/2016  . Encounter for antineoplastic immunotherapy 02/14/2016  . Chronic fatigue 02/14/2016  . Port catheter in place 02/09/2016  . Urine frequency 07/06/2014  . Diarrhea 05/24/2014  . Nausea and vomiting 05/24/2014  . Sepsis ([place]) 05/24/2014  . Hypothermia 05/24/2014  . Hypokalemia 05/24/2014  . Nausea vomiting and diarrhea 05/24/2014  . Proteinuria 04/28/2012  . Vitamin B12 deficiency 03/17/2012  . Unspecified deficiency anemia 12/02/2011  . 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 Multinodular goiter 11/29/2011  . Numbness 11/29/2011  . Acute pulmonary embolism ([place]) 11/04/2011  . Acute pulmonary embolism ([place]) 11/04/2011  . Hip pain 09/02/2011  . Foot pain 09/02/2011  . Bilateral lung cancer ([place]) 03/08/2011     Prior to Admission medications   Medication Sig Start Date End Date Taking? Authorizing Provider  acetaminophen (TYLENOL) 325 MG tablet Take 650 mg by mouth every 6 (six) hours as needed.   Yes [provider]  amLODipine (NORVASC) 5 MG tablet Take 10 mg by mouth daily. Per [name], NP   Yes [provider]  aspirin 81 MG tablet Take 81 mg by mouth daily.   Yes  [provider]  cephALEXin (KEFLEX) 250 MG capsule TAKE 2 CAPSULES TWICE A DAY FOR 1 WEEK, THEN TAKE ONE CAPSULE EVERY DAY 04/23/17  Yes [provider]  clonazePAM (KLONOPIN) 0.5 MG tablet Take 0.5 mg by mouth 2 (two) times daily as needed. Per [name], NP   Yes [provider]  cyanocobalamin (,VITAMIN B-12,) 1000 MCG/ML injection Inject 1,000 mcg into the muscle every 30 (thirty) days.   Yes [provider]  dorzolamide-timolol (COSOPT) 22.3-6.8 MG/ML ophthalmic  solution Place 2 drops into both eyes 2 (two) times daily. Per [name], NP   Yes [provider]  fluticasone (FLONASE) 50 MCG/ACT nasal spray Place 1 spray into both nostrils daily as needed for allergies or rhinitis.   Yes [provider]  levothyroxine (SYNTHROID, LEVOTHROID) 88 MCG tablet Take 1 tablet (88 mcg total) by mouth daily before breakfast. 06/18/17  Yes Ibrahem Volkman, PA-C  lidocaine-prilocaine (EMLA) cream Apply 1 application topically as needed (prior to port access). 03/05/16  Yes Curt Bears, MD  lisinopril (PRINIVIL,ZESTRIL) 40 MG tablet Take 1 tablet (40 mg total) by mouth daily. 06/18/17  Yes Dreden Rivere, PA-C  meloxicam (MOBIC) 7.5 MG tablet TAKE 1 TABLET BY MOUTH EVERY DAY 06/09/17  Yes Othman Masur, PA-C  PREMARIN vaginal cream INSERT BLUEBERRY SIZE AMOUNT OF ESTROGEN CREAM INTO VAGINA TWICE WEEKLY. 04/25/17  Yes [provider]  traMADol (ULTRAM) 50 MG tablet Take 1-2 tablets (50-100 mg total) by mouth every 6 (six) hours as needed for severe pain. 06/06/17  Yes Harrison Mons, PA-C     Allergies  Allergen Reactions  . Cymbalta [Duloxetine Hcl] Other (See Comments)    Involuntary movements " turned me completely around"  . Fish Allergy Diarrhea  . Amitriptyline     Initial reaction about 25 years ago.  . Codeine Nausea And Vomiting    vomiting  . Mirabegron Hypertension  . Solifenacin Other (See Comments)    Due to glaucoma  . Sulfa Antibiotics Nausea And Vomiting       Objective:  Physical Exam  Constitutional: She is oriented to person, place, and time. She appears well-developed and well-nourished. She is active and cooperative. No distress.  BP 121/69   Pulse 98   Temp 97.6 F (36.4 C) (Oral)   Resp 16   Ht 5' 4.96" (1.65 m)   Wt 117 lb (53.1 kg)   SpO2 96%   BMI 19.49 kg/m   HENT:  Head: Normocephalic and atraumatic.  Right Ear: Hearing normal.  Left Ear: Hearing normal.  Eyes: Conjunctivae are normal.  No scleral icterus.  Neck: Normal range of motion. Neck supple. No thyromegaly present.  Cardiovascular: Normal rate, regular rhythm and normal heart sounds.   Pulses:      Radial pulses are 2+ on the right side, and 2+ on the left side.  Pulmonary/Chest: Effort normal and breath sounds normal.  Musculoskeletal:  General muscle wasting.  Lymphadenopathy:       Head (right side): No tonsillar, no preauricular, no posterior auricular and no occipital adenopathy present.       Head (left side): No tonsillar, no preauricular, no posterior auricular and no occipital adenopathy present.    She has no cervical adenopathy.       Right: No supraclavicular adenopathy present.       Left: No supraclavicular adenopathy present.  Neurological: She is alert and oriented to person, place, and time. She displays atrophy. No sensory deficit. Gait abnormal.  Reflex Scores:  Patellar reflexes are 0 on the right side and 0 on the left side.      Achilles reflexes are 0 on the right side and 0 on the left side. LE weakness bilaterally.  Skin: Skin is warm, dry and intact. No rash noted. No cyanosis or erythema. Nails show no clubbing.  Psychiatric: She has a normal mood and affect. Her speech is normal and behavior is normal.      Assessment & Plan:   Problem List Items Addressed This Visit    Hypertension    Controlled. No changes.      DDD (degenerative disc disease), lumbosacral    Proceed with MRI scheduled for next week to evaluate for stenosis as possible cause of weakness.      Muscle weakness (generalized) - Primary    MRI as planned.       Other Visit Diagnoses    Bilateral low back pain with bilateral sciatica, unspecified chronicity       MRI as planned.   Relevant Medications   mirtazapine (REMERON) 7.5 MG tablet   Unintentional weight loss       Relevant Medications   mirtazapine (REMERON) 7.5 MG tablet   Decreased appetite       Relevant Medications   mirtazapine  (REMERON) 7.5 MG tablet   Dysphoric mood       Trial of mirtazepine. Encouraged her to contact me if she experiences adverse effects.   Relevant Medications   mirtazapine (REMERON) 7.5 MG tablet   Need for influenza vaccination       she relates she will obtain this at the [facility]   Low TSH level       Update this level. Will adjust levothyroxine dose if indicated.   Relevant Orders   TSH (Completed)   T4, free (Completed)       No Follow-up on file.   Fara Chute, PA-C [facility]

## 2017-07-04 NOTE — Progress Notes (Signed)
Subjective:    Patient ID: [name], female    DOB: [birth year], 76 y.o.   MRN: [id number]  HPI Patient returns today for follow up of persistent bilateral leg pain and weakness, LEFT greater than RIGHT. She states she has not fallen since her last visit on 06/06/2017. Her last fall was due to bilateral leg weakness, where her "legs just gave out" on her. She continues to ambulate with a straight cane when she is out and uses a walker at home. She relates diffuse " dull ache" in both legs that can get up to a 7/10 on the pain scale. She describes swelling in both knees. She also reports numbness and tingling in both hands and feet, which she attributes to peripheral neuropathy. She takes APAP, Aleve, and Tramadol separately for pain. She does not note any difference in pain relief between the medications. She states the medications help for the initial 5 hours, but then wears off.  She also complains of a 3/10 low back "ache" that is new. It does not radiate anywhere. The pain medications does help with this pain. She notes that PT would be more helpful if her legs were not so weak. She denies any urinary incontinence or hesitancy. She does note she had some bowel incontinence when she was taking Tarceva, but this has resolved since switching to Opdivo.  Review of Systems  Constitutional: Positive for appetite change and unexpected weight change (26lb loss over 3 months). Negative for fever.  HENT: Negative for trouble swallowing.   Respiratory: Negative for cough, chest tightness and shortness of breath.   Cardiovascular: Negative for chest pain, palpitations and leg swelling.  Gastrointestinal: Positive for constipation. Negative for abdominal pain, diarrhea, nausea and vomiting.  Musculoskeletal: Positive for back pain, gait problem and joint swelling.  Neurological: Positive for weakness and numbness. Negative for dizziness, light-headedness and headaches.  Psychiatric/Behavioral:   In last two weeks she has felt depressed and hopeless   Prior to Admission medications   Medication Sig Start Date End Date Taking? Authorizing Provider  acetaminophen (TYLENOL) 325 MG tablet Take 650 mg by mouth every 6 (six) hours as needed.   Yes [provider]  amLODipine (NORVASC) 5 MG tablet Take 10 mg by mouth daily. Per [name], NP   Yes [provider]  aspirin 81 MG tablet Take 81 mg by mouth daily.   Yes [provider]  cephALEXin (KEFLEX) 250 MG capsule TAKE 2 CAPSULES TWICE A DAY FOR 1 WEEK, THEN TAKE ONE CAPSULE EVERY DAY 04/23/17  Yes [provider]  clonazePAM (KLONOPIN) 0.5 MG tablet Take 0.5 mg by mouth 2 (two) times daily as needed. Per [name], NP   Yes [provider]  cyanocobalamin (,VITAMIN B-12,) 1000 MCG/ML injection Inject 1,000 mcg into the muscle every 30 (thirty) days.   Yes [provider]  dorzolamide-timolol (COSOPT) 22.3-6.8 MG/ML ophthalmic solution Place 2 drops into both eyes 2 (two) times daily. Per [name], NP   Yes [provider]  fluticasone (FLONASE) 50 MCG/ACT nasal spray Place 1 spray into both nostrils daily as needed for allergies or rhinitis.   Yes [provider]  levothyroxine (SYNTHROID, LEVOTHROID) 88 MCG tablet Take 1 tablet (88 mcg total) by mouth daily before breakfast. 06/18/17  Yes Jeffery, Chelle, PA-C  lidocaine-prilocaine (EMLA) cream Apply 1 application topically as needed (prior to port access). 03/05/16  Yes Curt Bears, MD  lisinopril (PRINIVIL,ZESTRIL) 40 MG tablet Take 1 tablet (40  mg total) by mouth daily. 06/18/17  Yes Jeffery, Chelle, PA-C  meloxicam (MOBIC) 7.5 MG tablet TAKE 1 TABLET BY MOUTH EVERY DAY 06/09/17  Yes Jeffery, Chelle, PA-C  PREMARIN vaginal cream INSERT BLUEBERRY SIZE AMOUNT OF ESTROGEN CREAM INTO VAGINA TWICE WEEKLY. 04/25/17  Yes [provider]  traMADol (ULTRAM) 50 MG tablet Take 1-2 tablets (50-100 mg total) by  mouth every 6 (six) hours as needed for severe pain. 06/06/17  Yes Harrison Mons, PA-C   Allergies  Allergen Reactions  . Cymbalta [Duloxetine Hcl] Other (See Comments)    Involuntary movements " turned me completely around"  . Fish Allergy Diarrhea  . Amitriptyline     Initial reaction about 25 years ago.  . Codeine Nausea And Vomiting    vomiting  . Mirabegron Hypertension  . Solifenacin Other (See Comments)    Due to glaucoma  . Sulfa Antibiotics Nausea And Vomiting   Patient Active Problem List   Diagnosis Date Noted  . Muscle weakness (generalized) 06/06/2017  . Multiple falls 06/06/2017  . DDD (degenerative disc disease), lumbosacral 05/15/2017  . Incontinence of feces 04/23/2017  . Vaginal atrophy 04/23/2017  . Voiding dysfunction 04/23/2017  . Recurrent UTI 04/23/2017  . Dysuria 03/04/2017  . Hypertension 10/29/2016  . Encounter for antineoplastic immunotherapy 02/14/2016  . Chronic fatigue 02/14/2016  . Port catheter in place 02/09/2016  . Urine frequency 07/06/2014  . Diarrhea 05/24/2014  . Nausea and vomiting 05/24/2014  . Sepsis ([place]) 05/24/2014  . Hypothermia 05/24/2014  . Hypokalemia 05/24/2014  . Nausea vomiting and diarrhea 05/24/2014  . Proteinuria 04/28/2012  . Vitamin B12 deficiency 03/17/2012  . Unspecified deficiency anemia 12/02/2011  . 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 Multinodular goiter 11/29/2011  . Numbness 11/29/2011  . Acute pulmonary embolism ([place]) 11/04/2011  . Acute pulmonary embolism ([place]) 11/04/2011  . Hip pain 09/02/2011  . Foot pain 09/02/2011  . Bilateral lung cancer ([place]) 03/08/2011       Objective:   Physical Exam  Constitutional: She is oriented to person, place, and time. She appears well-developed.  Cardiovascular: Normal rate, regular rhythm, normal heart sounds and intact distal pulses.   Pulmonary/Chest: Effort normal and breath sounds normal.  Musculoskeletal:       Right shoulder: She exhibits  decreased strength (4/5 strength dorsi/plantar flexion of right foot, 3/5 strength dorsi/plantar flexion of LEFT foot, 4/5 strength bilateral hip, knee flexion/extension, 5/5 hip abduction/adduction. ).  Neurological: She is alert and oriented to person, place, and time. She displays atrophy (of bilateral gastrocnemius). She exhibits abnormal muscle tone (bilateral lower extremity). Gait (unsteady) abnormal.  Reflex Scores:      Patellar reflexes are 0 on the right side and 0 on the left side.      Achilles reflexes are 0 on the right side and 0 on the left side. Skin: Skin is warm and dry.      Assessment & Plan:  1. Muscle weakness (generalized) 2. DDD (degenerative disc disease), lumbosacral 3. Bilateral low back pain with bilateral sciatica, unspecified chronicity - Patient is scheduled for MRI of lumbar spine without contrast on 07/10/2017 to evaluate for spinal stenosis and neural compression.  - To see neurology afterwards  - Continue to ambulate with cane and rolling walker to avoid future falls  - Continue taking Tramadol, Aleve, or Tylenol PRN for pain relief   4. Essential hypertension - Controlled - Continue on current treatment.   5. Need for influenza vaccination - Patient verbalized she will obtain at  [facility].   6. Unintentional weight loss of 26lb over 3 months 7. Decreased appetite - Obtain TSH levels to evaluate for etiology of weight loss.  - Prescribed Mirtazapine to be taken QHS  Respectfully, Denny Levy PA-S 2019

## 2017-07-04 NOTE — Patient Instructions (Addendum)
Take the new medicine at BEDTIME. If you have any negative side effects from the new medicine, you can stop it and let me know.    IF you received an x-ray today, you will receive an invoice from [facility]. Please contact [facility] at [phone] with questions or concerns regarding your invoice.   IF you received labwork today, you will receive an invoice from [facility]. Please contact [facility] at [phone] with questions or concerns regarding your invoice.   Our billing staff will not be able to assist you with questions regarding bills from these companies.  You will be contacted with the lab results as soon as they are available. The fastest way to get your results is to activate your My Chart account. Instructions are located on the last page of this paperwork. If you have not heard from [place] regarding the results in 2 weeks, please contact this office.

## 2017-07-05 LAB — TSH: TSH: 4.89 u[IU]/mL — ABNORMAL HIGH (ref 0.450–4.500)

## 2017-07-05 LAB — T4, FREE: FREE T4: 1.59 ng/dL (ref 0.82–1.77)

## 2017-07-07 ENCOUNTER — Ambulatory Visit: Payer: Medicare Other

## 2017-07-07 ENCOUNTER — Ambulatory Visit (HOSPITAL_COMMUNITY): Payer: Medicare Other

## 2017-07-08 ENCOUNTER — Ambulatory Visit: Payer: Medicare Other

## 2017-07-08 ENCOUNTER — Other Ambulatory Visit: Payer: Medicare Other

## 2017-07-08 ENCOUNTER — Ambulatory Visit: Payer: Medicare Other | Admitting: Internal Medicine

## 2017-07-08 ENCOUNTER — Telehealth: Payer: Self-pay

## 2017-07-08 NOTE — Telephone Encounter (Signed)
Pt called to cancel and r/s todays appt with Dr Julien Nordmann. Pt had to cancel CT yesterday b/c she had a lot of diarrhea from the contrast she drank. She went through 6-7 depends and was incontinent. She is still having diarrhea x1 today, and was incontinent over night.  She is weak. Encouraged her to drink 8 regular cups of water today, to use her imodium. She has no driver today and feels too weak to drive herself.   Lost call and called back LVM to drink water today, and gatorade. To go to ER via ambulance if she starts to feel worse.

## 2017-07-08 NOTE — Telephone Encounter (Signed)
Pt will call radiology to r/s CT before her appt on 10/9.  Pt's new PCP is Dr Harrison Mons off [address]. Her notes can be seen in epic.

## 2017-07-09 ENCOUNTER — Telehealth: Payer: Self-pay | Admitting: Internal Medicine

## 2017-07-09 ENCOUNTER — Other Ambulatory Visit: Payer: Self-pay | Admitting: Medical Oncology

## 2017-07-09 DIAGNOSIS — C3492 Malignant neoplasm of unspecified part of left bronchus or lung: Principal | ICD-10-CM

## 2017-07-09 DIAGNOSIS — C3491 Malignant neoplasm of unspecified part of right bronchus or lung: Secondary | ICD-10-CM

## 2017-07-09 NOTE — Telephone Encounter (Signed)
Per Heide Guile - disregard 9/25 sch message - patient will pick back up on 10/9

## 2017-07-10 ENCOUNTER — Telehealth: Payer: Self-pay | Admitting: Physician Assistant

## 2017-07-10 ENCOUNTER — Ambulatory Visit (HOSPITAL_COMMUNITY)
Admission: RE | Admit: 2017-07-10 | Discharge: 2017-07-10 | Disposition: A | Discharge status: Home / Self Care | Payer: Medicare Other | Source: Ambulatory Visit | Attending: Physician Assistant | Admitting: Physician Assistant

## 2017-07-10 ENCOUNTER — Telehealth: Payer: Self-pay | Admitting: Medical Oncology

## 2017-07-10 ENCOUNTER — Encounter: Payer: Self-pay | Admitting: Radiation Oncology

## 2017-07-10 DIAGNOSIS — M47816 Spondylosis without myelopathy or radiculopathy, lumbar region: Secondary | ICD-10-CM | POA: Diagnosis not present

## 2017-07-10 DIAGNOSIS — M6281 Muscle weakness (generalized): Secondary | ICD-10-CM | POA: Insufficient documentation

## 2017-07-10 DIAGNOSIS — M5442 Lumbago with sciatica, left side: Secondary | ICD-10-CM | POA: Insufficient documentation

## 2017-07-10 DIAGNOSIS — R296 Repeated falls: Secondary | ICD-10-CM | POA: Insufficient documentation

## 2017-07-10 DIAGNOSIS — M5441 Lumbago with sciatica, right side: Secondary | ICD-10-CM | POA: Diagnosis not present

## 2017-07-10 DIAGNOSIS — M48061 Spinal stenosis, lumbar region without neurogenic claudication: Secondary | ICD-10-CM | POA: Insufficient documentation

## 2017-07-10 DIAGNOSIS — M5137 Other intervertebral disc degeneration, lumbosacral region: Secondary | ICD-10-CM | POA: Diagnosis present

## 2017-07-10 DIAGNOSIS — M5126 Other intervertebral disc displacement, lumbar region: Secondary | ICD-10-CM | POA: Diagnosis not present

## 2017-07-10 NOTE — Telephone Encounter (Addendum)
Spoke with Dr. Lindi Adie, on call oncologist, as Dr. Julien Nordmann is out of the office until 07/14/2017.  Advised that patient needs admission through the [facility] for emergent steroids and radiation therapy. Neurosurgery will need to be consulted, but unlikely that surgical intervention will be recommended.  Dr. Lindi Adie will alert radiology. I will contact the patient.

## 2017-07-10 NOTE — Assessment & Plan Note (Signed)
Proceed with MRI scheduled for next week to evaluate for stenosis as possible cause of weakness.

## 2017-07-10 NOTE — Telephone Encounter (Addendum)
Spoke with Dr. Burna Forts in radiology. "Huge" mass in the lumbar region L3 or L4, expanding through the cortex and causing central canal and foraminal stenosis. Most certainly a metastasis from lung cancer. No pathologic fracture.  It is of note that plain films of the lumbar spine in July showed NO MASS.  Contacted the [facility]. Spoke with Caryl Pina. Hinton Dyer, Dr. Worthy Flank Navigator is not there today, and neither Dr. Julien Nordmann nor his nurse is available to come to the phone.  Left message with the above information, and Caryl Pina advised that Dr. Julien Nordmann or his nurse would call me back to arrange next steps.

## 2017-07-10 NOTE — Telephone Encounter (Signed)
Spoke to [employer], [place] . Pt has large mass from lung cancer in lumbar area. I referred [facility] to call Dr Lindi Adie who is on call .

## 2017-07-10 NOTE — Assessment & Plan Note (Signed)
Controlled. No changes.

## 2017-07-10 NOTE — [facility] Notes (Signed)
  Radiation Oncology         [phone] ________________________________  Name: [name] MRN: [id number]  Date: 07/10/2017  DOB: [birth year]  Chart Note:  I was notified that this patient was advised to present to the ED for evaluation.  She has a history of metastatic non-small cell lung cancer.  She has a several month history of leg weakness/falling.  MRI lumbar shows L3 metastasis with spinal nerve root compression.    Will follow and potentially offer urgent radiotherapy after admission on Friday  ________________________________  Sheral Apley. Tammi Klippel, M.D.

## 2017-07-10 NOTE — Assessment & Plan Note (Signed)
MRI as planned.

## 2017-07-10 NOTE — Telephone Encounter (Signed)
Spoke with patient and relayed the results, and advised of the plan. She needs to arrange for someone to take care of her dog and drive her to the ED.

## 2017-07-11 ENCOUNTER — Encounter: Payer: Self-pay | Admitting: Radiation Oncology

## 2017-07-11 ENCOUNTER — Telehealth: Payer: Self-pay | Admitting: Urology

## 2017-07-11 NOTE — Telephone Encounter (Signed)
Spoke with patient. She has made arrangements for dog boarding beginning 07/14/2017. She wants to spend as much time with her dog, Sadie, as she can, afraid that she may never see her again. She has received a call from "some lady" from the [facility] who indicated that treatment is urgent, and they'd like to make arrangements for outpatient treatment beginning today. Unfortunately, the patient doesn't know who called her or how to get back in touch. The person told the patient she would call her back.  Called the [facility]. Left message for Hinton Dyer, Dr. Worthy Flank Navigator.

## 2017-07-11 NOTE — Telephone Encounter (Signed)
I called and spoke with the patient in follow up to her recent MRI findings and recommendation for hospital admission with urgent radiotherapy to the lumbar spine lesion which is causing progressive weakness in the LEs and multiple falls at home. She reports that she did not feel like coming to the hospital last night and does not feel like coming to the hospital today because she wants to spend some additional time with her dog.  She denies significant low back pain but admits to having a fall at her home already this morning due to the eakness in the LEs. She denies any loss of bowel or bladder control.  I reiterated the significance of her MRI findings and the risk of permanent paralysis and long-term damage to the spinal cord if she chooses to postpone care and treatment.  She reports that she understands the risks and is willing to take her chances.  I offered to help with arranging transportation and/or dog-sitting but she declined stating that she could make appropriate arrangements if she indeed decides to pursue care prior to next week.  At this time, she wishes to wait until Monday to pursue further evaluation and treatment.  At her request, we will make arrangements for an outpatient consult visit on Monday 07/14/17 with plans for CT Simulation to follow her consult visit same day in preparation to begin treatment next early week.  She is advised to present to the ER immediately should she have any progressive weakness, numbness or tingling in the LEs or loss of control of bowel or bladder function.  She states her understanding and compliance.   Nicholos Johns, PA-C

## 2017-07-11 NOTE — Telephone Encounter (Signed)
PATIENT CALLED TO SAY THAT CHELLE WANTED HER TO GO TO THE HOSPITAL LAST NIGHT (07/10/17) AND THAT IT WAS URGENT. SHE SAID SHE COULD NOT GO BECAUSE SHE DIDN'T HAVE ANYONE TO TAKE CARE OF HER DOG. SHE WAS ALSO VERY TIRED FROM HER LONG DAY YESTERDAY WITH HAVING THE MRI AND SHE JUST NEEDS TO LET EVERYTHING "SINK IN." A BOARDING FACILITY WILL BE ABLE TO TAKE HER DOG ON Monday. SHE WOULD LIKE CHELLE TO CALL HER TODAY AS SOON AS POSSIBLE. BEST PHONE [phone]  [place]

## 2017-07-11 NOTE — [place] Notes (Signed)
Histology and Location of Primary Cancer: recurrent non-small cell lung cancer, adenocarcinoma initially diagnosed as stage IB (T2a, N0, M0) in December 2010.  Sites of Visceral and Bony Metastatic Disease: spine  Location(s) of Symptomatic Metastases: L3, L5 and S1  Past/Anticipated chemotherapy by medical oncology, if any:  PRIOR THERAPY: 1.Status post right upper lobectomy on September 28, 2009 under the care of Dr. Arlyce Dice. The patient refused adjuvant chemotherapy at that time. She had disease recurrence in June of 2012.  2.Status post 4 cycles of systemic chemotherapy with carboplatin for an AUC of 6 and paclitaxel at 200 mg per meter squared and Avastin at 15 mg/kg according to the ECOG protocol #5508.  3. Chemotherapy with Avastin 15 mg/kg according to the ECOG protocol #5508 status post 32 cycles. Last cycle was given on 06/30/2013 discontinued secondary to disease progression and persistent proteinuria. 4. Tarceva 150 mg by mouth daily, started 08/09/2013, status post 10 months of treatment. 5. Tarceva 100 mg by mouth daily started 07/02/2014, status post 19 months, discontinued secondary to disease progression.  CURRENT THERAPY: Immunotherapy with Nivolumab 240 mg IV every 2 weeks status post 33 cycles. First dose was given 02/20/2016.  Pain on a scale of 0-10 is: Reports constant bilateral aching lower leg pain 8-10 on a scale of 0-10. Also, patient reports lumbar spine pain more so on the right side since fall last night   If Spine Met(s), symptoms, if any, include:  Bowel/Bladder retention or incontinence (please describe): Reports occasional bladder leakage related to pessary   Numbness or weakness in extremities (please describe): yes bilateral lower extremities. 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 Walker? Wheelchair?: Ambulatory with Assistance. Reports using a walker to  ambulate at home.   SAFETY ISSUES:  Prior radiation? no  Pacemaker/ICD? no  Possible current pregnancy? no  Is the patient on methotrexate? no  Current Complaints / other details:  76 year old female. Dog, Sadie. Reports she has experienced eight falls. Reports the first fall was one month ago while at her mailbox. Reports since her initial fall a month ago she has been very weak with little appetite. Patient accompanied today by her daughter.

## 2017-07-14 ENCOUNTER — Ambulatory Visit
Admission: RE | Admit: 2017-07-14 | Discharge: 2017-07-14 | Disposition: A | Discharge status: Home / Self Care | Payer: Medicare Other | Source: Ambulatory Visit | Attending: Radiation Oncology | Admitting: Radiation Oncology

## 2017-07-14 ENCOUNTER — Encounter: Payer: Self-pay | Admitting: Radiation Oncology

## 2017-07-14 VITALS — BP 89/61 | HR 98 | Temp 98.1°F | Resp 18 | Ht 65.0 in | Wt 110.0 lb

## 2017-07-14 DIAGNOSIS — C7951 Secondary malignant neoplasm of bone: Secondary | ICD-10-CM | POA: Diagnosis not present

## 2017-07-14 DIAGNOSIS — Z91013 Allergy to seafood: Secondary | ICD-10-CM | POA: Insufficient documentation

## 2017-07-14 DIAGNOSIS — Z7982 Long term (current) use of aspirin: Secondary | ICD-10-CM | POA: Diagnosis not present

## 2017-07-14 DIAGNOSIS — C7952 Secondary malignant neoplasm of bone marrow: Secondary | ICD-10-CM | POA: Insufficient documentation

## 2017-07-14 DIAGNOSIS — Z823 Family history of stroke: Secondary | ICD-10-CM | POA: Diagnosis not present

## 2017-07-14 DIAGNOSIS — G893 Neoplasm related pain (acute) (chronic): Secondary | ICD-10-CM

## 2017-07-14 DIAGNOSIS — Z882 Allergy status to sulfonamides status: Secondary | ICD-10-CM | POA: Diagnosis not present

## 2017-07-14 DIAGNOSIS — H409 Unspecified glaucoma: Secondary | ICD-10-CM | POA: Insufficient documentation

## 2017-07-14 DIAGNOSIS — Z510 Encounter for antineoplastic radiation therapy: Secondary | ICD-10-CM | POA: Insufficient documentation

## 2017-07-14 DIAGNOSIS — Z9221 Personal history of antineoplastic chemotherapy: Secondary | ICD-10-CM | POA: Diagnosis not present

## 2017-07-14 DIAGNOSIS — C7800 Secondary malignant neoplasm of unspecified lung: Secondary | ICD-10-CM | POA: Diagnosis not present

## 2017-07-14 DIAGNOSIS — Z8249 Family history of ischemic heart disease and other diseases of the circulatory system: Secondary | ICD-10-CM | POA: Insufficient documentation

## 2017-07-14 DIAGNOSIS — E7800 Pure hypercholesterolemia, unspecified: Secondary | ICD-10-CM | POA: Diagnosis not present

## 2017-07-14 DIAGNOSIS — Z902 Acquired absence of lung [part of]: Secondary | ICD-10-CM | POA: Insufficient documentation

## 2017-07-14 DIAGNOSIS — Z85118 Personal history of other malignant neoplasm of bronchus and lung: Secondary | ICD-10-CM | POA: Diagnosis not present

## 2017-07-14 DIAGNOSIS — Z888 Allergy status to other drugs, medicaments and biological substances status: Secondary | ICD-10-CM | POA: Diagnosis not present

## 2017-07-14 DIAGNOSIS — I10 Essential (primary) hypertension: Secondary | ICD-10-CM | POA: Diagnosis not present

## 2017-07-14 DIAGNOSIS — Z801 Family history of malignant neoplasm of trachea, bronchus and lung: Secondary | ICD-10-CM | POA: Insufficient documentation

## 2017-07-14 DIAGNOSIS — C787 Secondary malignant neoplasm of liver and intrahepatic bile duct: Secondary | ICD-10-CM | POA: Diagnosis not present

## 2017-07-14 DIAGNOSIS — Z885 Allergy status to narcotic agent status: Secondary | ICD-10-CM | POA: Insufficient documentation

## 2017-07-14 DIAGNOSIS — M549 Dorsalgia, unspecified: Secondary | ICD-10-CM | POA: Insufficient documentation

## 2017-07-14 DIAGNOSIS — Z79899 Other long term (current) drug therapy: Secondary | ICD-10-CM | POA: Diagnosis not present

## 2017-07-14 DIAGNOSIS — Z87891 Personal history of nicotine dependence: Secondary | ICD-10-CM | POA: Insufficient documentation

## 2017-07-14 DIAGNOSIS — E039 Hypothyroidism, unspecified: Secondary | ICD-10-CM | POA: Insufficient documentation

## 2017-07-14 HISTORY — DX: Malignant neoplasm of bone and articular cartilage, unspecified: C41.9

## 2017-07-14 NOTE — Progress Notes (Signed)
Radiation Oncology         [phone]) [phone] ________________________________  Initial Outpatient Consultation  Name: [name] MRN: [id number]  Date of Service: 07/14/2017 DOB: [birth year]  IO:EVOJJKK, Chelle, PA-C  Nicholas Lose, MD   REFERRING PHYSICIAN: Nicholas Lose, MD  DIAGNOSIS: 76 y.o. 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 Secondary malignant neoplasm of bone and bone marrow (HCC) C79.51    C79.52     HISTORY OF PRESENT ILLNESS: [name] is a 76 y.o. female seen at the request of Dr. Julien Nordmann for a  history of recurrent non-small cell lung cancer, adenocarcinoma initially diagnosed as stage IB (T2a, N0, M0) in December 2010, now with metastasis to L3, L5, and S1. She is status post right upper lobectomy on 09/28/2009 under the care of Dr. Arlyce Dice. She refused adjuvant chemotherapy at that time. She had disease recurrence in June 2012. She received systemic chemotherapy in 2014 that was discontinued secondary to disease progression and persistent proteinuria. She was then started on Tarceva, and that was discontinued in September 2015 secondary to disease progression. She is currently receiving immunotherapy with Nivolumab every 2 weeks under the care of Dr. Julien Nordmann, started in May 2017. The patient presented to her primary care provider, Harrison Mons, PA-C for evaluation of persistent leg pain and weakness. She had been going to PT for this. She was referred to neurology for additional evaluation. MRI of the lumbar spine on 07/10/2017 showed L3, L5, S1 metastasis with tumoral expansion of L3 vertebral body. There is nerve impingement in L3 and L4.   She is due for her next infusion on Tuesday next week. She is also scheduled for restaging CT scan of the chest, abdomen, and pelvis that are scheduled for tomorrow 07/15/2017. She comes today to discuss options of radiotherapy to her spine.     PREVIOUS RADIATION THERAPY: No  PAST MEDICAL HISTORY:  Past Medical History:  Diagnosis Date  . Bilateral lung cancer ([place]) 03/08/2011   recurrent  . Bone cancer ([place])    lung ca with spine mets  . Chronic fatigue 02/14/2016  . Dysuria 03/04/2017  . Encounter for antineoplastic immunotherapy 02/14/2016  . Foot pain 09/02/2011  . Glaucoma   . Hip pain 09/02/2011  . Hypercholesterolemia   . Hypertension   . Hypertension 10/29/2016  . Hypothyroidism   . Lung cancer ([place]) 03/08/2011   recurrent      PAST SURGICAL HISTORY: Past Surgical History:  Procedure Laterality Date  . LUNG LOBECTOMY  09/28/2009   right upper  . THORACOTOMY  09/28/2009   mini  . VIDEO ASSISTED THORACOSCOPY  09/28/2009    FAMILY HISTORY:  Family History  Problem Relation Age of Onset  . Stroke Father   . Heart disease Father 91       MI  . Cancer Brother        lung  . Cancer Cousin        unknown    SOCIAL HISTORY:  Social History   Social History  . Marital status: Widowed    Spouse name: N/A  . Number of children: N/A  . Years of education: N/A   Occupational History  . Retired    Social History Main Topics  . Smoking status: Former Smoker    Packs/day: 1.00    Years: 50.00    Types: Cigarettes    Quit date:  09/28/2009  . Smokeless tobacco: Never Used  . Alcohol use No  . Drug use: No  . Sexual activity: No   Other Topics Concern  . Not on file   Social History Narrative   Lives alone with her dog. Daughters live in [place] and [place], [place].  She lives in [place] and is accompanied by her daughter who works for [employer] in [name].  ALLERGIES: Cymbalta [duloxetine hcl]; Fish allergy; Amitriptyline; Codeine; Mirabegron; Solifenacin; and Sulfa antibiotics  MEDICATIONS:  Current Outpatient Prescriptions  Medication Sig Dispense Refill  . acetaminophen (TYLENOL) 325 MG tablet Take 650 mg by mouth every 6 (six) hours as needed.    Marland Kitchen amLODipine (NORVASC) 5 MG tablet Take 10  mg by mouth daily. Per [name], NP    . aspirin 81 MG tablet Take 81 mg by mouth daily.    . cephALEXin (KEFLEX) 250 MG capsule TAKE 2 CAPSULES TWICE A DAY FOR 1 WEEK, THEN TAKE ONE CAPSULE EVERY DAY  1  . clonazePAM (KLONOPIN) 0.5 MG tablet Take 0.5 mg by mouth 2 (two) times daily as needed. Per [name], NP    . cyanocobalamin (,VITAMIN B-12,) 1000 MCG/ML injection Inject 1,000 mcg into the muscle every 30 (thirty) days.    . dorzolamide-timolol (COSOPT) 22.3-6.8 MG/ML ophthalmic solution Place 2 drops into both eyes 2 (two) times daily. Per [name], NP    . fluticasone (FLONASE) 50 MCG/ACT nasal spray Place 1 spray into both nostrils daily as needed for allergies or rhinitis.    Marland Kitchen levothyroxine (SYNTHROID, LEVOTHROID) 88 MCG tablet Take 1 tablet (88 mcg total) by mouth daily before breakfast. 30 tablet 2  . lidocaine-prilocaine (EMLA) cream Apply 1 application topically as needed (prior to port access). 30 g 0  . lisinopril (PRINIVIL,ZESTRIL) 40 MG tablet Take 1 tablet (40 mg total) by mouth daily. 90 tablet 3  . meloxicam (MOBIC) 7.5 MG tablet TAKE 1 TABLET BY MOUTH EVERY DAY 30 tablet 0  . PREMARIN vaginal cream INSERT BLUEBERRY SIZE AMOUNT OF ESTROGEN CREAM INTO VAGINA TWICE WEEKLY.  1  . traMADol (ULTRAM) 50 MG tablet Take 1-2 tablets (50-100 mg total) by mouth every 6 (six) hours as needed for severe pain. 30 tablet 0  . mirtazapine (REMERON) 7.5 MG tablet Take 1 tablet (7.5 mg total) by mouth at bedtime. (Patient not taking: Reported on 07/14/2017) 30 tablet 2   No current facility-administered medications for this encounter.     REVIEW OF SYSTEMS:  On review of systems, the patient reports that she is doing well overall but would like to have something to make her appetite improve. She reports she has lost about 20 pounds in the last two months. She reports no appetite. She denies any chest pain, shortness of breath, cough, fevers, chills, night sweats. She denies any  bowel or bladder disturbances, and denies abdominal pain, nausea or vomiting. She describes weakness in her legs bilaterally from the thighs down. She has fallen several times and reports that she continues to have dull aching pain in her legs daily and that this also wakes her up at night. She reports low back pain that is similar in distribution. She denies any incontinent episodes or loss of control of her feet despite her weakness. She also has neuropathy which complicates her sensation of her feet. She denies any new musculoskeletal or joint aches or pains. A complete review of systems is obtained and is otherwise negative.    PHYSICAL EXAM:  Wt Readings from  Last 3 Encounters:  07/14/17 110 lb (49.9 kg)  07/04/17 117 lb (53.1 kg)  06/24/17 120 lb 4.8 oz (54.6 kg)   Temp Readings from Last 3 Encounters:  07/14/17 98.1 F (36.7 C) (Oral)  07/04/17 97.6 F (36.4 C) (Oral)  06/24/17 98 F (36.7 C) (Oral)   BP Readings from Last 3 Encounters:  07/14/17 (!) 89/61  07/04/17 121/69  06/24/17 108/61   Pulse Readings from Last 3 Encounters:  07/14/17 98  07/04/17 98  06/24/17 86   Pain Assessment Pain Score: 8  (lumbar spine)/10  In general this is a elderly, chronically ill appearing African-American woman in no acute distress. She is alert and oriented x4 and appropriate throughout the examination. HEENT reveals that the patient is normocephalic, atraumatic. EOMs are intact. PERRLA. Skin is intact without any evidence of gross lesions. Cardiovascular exam reveals a regular rate and rhythm, no clicks rubs or murmurs are auscultated. Chest is clear to auscultation bilaterally. Lymphatic assessment is performed and does not reveal any adenopathy in the cervical, supraclavicular, axillary, or inguinal chains. Abdomen has active bowel sounds in all quadrants and is intact. The abdomen is soft, non tender, non distended. Lower extremities are negative for pretibial pitting edema, deep calf  tenderness, cyanosis or clubbing. Sensation in the lower extremities is intact to light touch bilaterally. Her strength of her bilateral lower extremities is 3-4/5 on the left and 4-5/5 on the right.     KPS = 70  100 - Normal; no complaints; no evidence of disease. 90   - Able to carry on normal activity; minor signs or symptoms of disease. 80   - Normal activity with effort; some signs or symptoms of disease. 33   - Cares for self; unable to carry on normal activity or to do active work. 60   - Requires occasional assistance, but is able to care for most of his personal needs. 50   - Requires considerable assistance and frequent medical care. 73   - Disabled; requires special care and assistance. 54   - Severely disabled; hospital admission is indicated although death not imminent. 15   - Very sick; hospital admission necessary; active supportive treatment necessary. 10   - Moribund; fatal processes progressing rapidly. 0     - Dead  Karnofsky DA, Abelmann Leighton, Craver LS and Burchenal [facility] [phone]) The use of the nitrogen mustards in the palliative treatment of carcinoma: with particular reference to bronchogenic carcinoma Cancer 1 634-56  LABORATORY DATA:  Lab Results  Component Value Date   WBC 9.6 06/24/2017   HGB 12.0 06/24/2017   HCT 36.4 06/24/2017   MCV 90.3 06/24/2017   PLT 209 06/24/2017   Lab Results  Component Value Date   NA 137 06/24/2017   K 4.9 06/24/2017   CL 105 01/11/2015   CO2 22 06/24/2017   Lab Results  Component Value Date   ALT 14 06/24/2017   AST 15 06/24/2017   ALKPHOS 96 06/24/2017   BILITOT 0.44 06/24/2017     RADIOGRAPHY: Mr Lumbar Spine Wo Contrast  Result Date: 07/10/2017 CLINICAL DATA:  Back pain. Lower extremity weakness. History of lung cancer. EXAM: MRI LUMBAR SPINE WITHOUT CONTRAST TECHNIQUE: Multiplanar, multisequence MR imaging of the lumbar spine was performed. No intravenous contrast was administered. Patient did not wish to proceed  with contrast and enhanced imaging at this time. COMPARISON:  Lumbar spine radiographs April 28, 2017 and PET CT April 18, 2011 FINDINGS: Mild motion degraded examination. SEGMENTATION: For  the purposes of this report, the last well-formed intervertebral disc will be reported as L5-S1. ALIGNMENT: Maintained lumbar lordosis. No malalignment. Mild mid lumbar levoscoliosis as seen on prior radiographs. VERTEBRAE:Low T1, intermediate T2 expansile signal replaces the L3 vertebral body extending through the pedicles into the transverse process and superior articular facets, sparing of the spinous process. Mild RIGHT L3 wedging, relatively stable from 2012. 8 mm similar signal lesion RIGHT S1 and 4 mm lesion of LEFT L5 transverse process vertebral body. Severe L2-3 through L4-5 disc height loss with moderate L3-4 and L4-5, mild L2-3 chronic discogenic endplate changes, trace acute component L2-3. Remaining disc morphology is preserved. Small T11-12 disc protrusion. CONUS MEDULLARIS: Conus medullaris terminates at L1-2 and demonstrates normal morphology and signal characteristics. Tumor centrally displaces the cauda equina at L3. Cauda equina is otherwise unremarkable. PARASPINAL AND SOFT TISSUES: Bright interstitial paraspinal muscle signal L3-4, L4-5. Mild symmetric paraspinal muscle atrophy. DISC LEVELS: L1-2: No disc bulge, canal stenosis nor neural foraminal narrowing. L2-3: 5 mm broad-based disc bulge. Superimposed ventral epidural and lateral recess tumor measuring to 10 mm in AP dimension resulting in severe canal stenosis and effacement of the thecal sac. Lateral recess effacement with L3 nerve impingement. Mild bilateral neural foraminal narrowing. L3-4: Small LEFT central disc protrusion and annular fissure. Mild facet arthropathy and ligamentum flavum redundancy. Mild canal stenosis and effaced LEFT lateral recess likely affecting the traversing LEFT L4 nerve. Epidural tumor extending into the superior aspect of  the neural foramen resulting in bilateral severe neural foraminal narrowing. L4-5: 7 mm broad-based disc bulge. Large LEFT subarticular to extraforaminal disc extrusion with annular fissure. Mild RIGHT, mild to moderate LEFT facet arthropathy and ligamentum flavum redundancy with trace RIGHT facet effusion which is likely reactive. Partially effaced lateral recesses may affect the traversing L5 nerves. No canal stenosis. Severe LEFT neural foraminal narrowing and displaced LEFT exited L4 nerve. L5-S1: No disc bulge. Moderate to severe facet arthropathy without canal stenosis. LEFT transitional anatomy with pseudoarthrosis. Mild RIGHT neural foraminal narrowing. IMPRESSION: 1. L3, L5 and S1 metastasis with tumoral expansion of L3 vertebral body. Mild chronic RIGHT L3 wedging. 2. L3 tumor expansion to epidural space resulting in severe canal stenosis at L3 and, severe neural foraminal narrowing L3-4. L3 nerve impingement. 3. Large LEFT L4-5 disc extrusion resulting in severe LEFT L4-5 neural foraminal narrowing and exited LEFT L4 nerve impingement. 4. Degenerative change of the lumbar spine.  No malalignment. Acute findings discussed with and reconfirmed by PA.CHELLE JEFFERY on 07/10/2017 at 2:00 pm. Electronically Signed   By: Elon Alas M.D.   On: 07/10/2017 14:30      IMPRESSION/PLAN: 1. 76 y.o. female with recurrent metastatic Stage IB, pT2a, N0, M0, NSCLC, adenocarcinoma of the right upper lobe with disease in the liver, lung, and spine. The patient is counseled on the findings from her recent MRI imaging. We Discussed the concerns that her tumor is close to her spinal cord, and this continues forward, could cause paralysis. The patient is currently neurologically intact, and we will hold off on steroids at this time, however, would consider this if her symptoms progress. We discussed the options for palliative radiotherapy, and we recommend course of 30 Gy in 10 fractions. We will simulate her today,  she has signed a written consent, copies provided to her as well. We will plan to start treatment later this week. Again the patient and her daughter will contact us if her symptoms change regarding her neurologic status. 2. Leg and spine  pain. We anticipate improving her back pain which could be leading to referred pain as well, however we will also image today to see if her femurs appear to be involved. She is also going for a diagnostic scan tomorrow for restaging to determine if there is anything else given her disease causing her symptoms. She's also prescription for a stronger pain medication than tramadol, but refuses.  We spent 50 minutes face to face with the patient and more than 50% of that time was spent in counseling and/or coordination of care.     Carola Rhine, PAC and   Tyler Pita, MD [facility] Medical Director and Director of Stereotactic Radiosurgery Direct Dial: [phone]  Fax: [phone] [email or web address]  Skype  [email or web address]  This document serves as a record of services personally performed by Tyler Pita, MD and Shona Simpson, PA-C. It was created on their behalf by Rae Lips, a trained medical scribe. The creation of this record is based on the scribe's personal observations and the providers' statements to them. This document has been checked and approved by the attending providers.

## 2017-07-14 NOTE — Progress Notes (Signed)
  Radiation Oncology         [phone] ________________________________  Name: [name] MRN: [id number]  Date: 07/14/2017  DOB: 22-May-1941  SIMULATION AND TREATMENT PLANNING NOTE    ICD-10-CM   1. Metastasis to spinal column ([place]) C79.51     DIAGNOSIS:  76 y.o. patient with L3, L5 and S1 spinal metastasis  NARRATIVE:  The patient was brought to the [facility].  Identity was confirmed.  All relevant records and images related to the planned course of therapy were reviewed.  The patient freely provided informed written consent to proceed with treatment after reviewing the details related to the planned course of therapy. The consent form was witnessed and verified by the simulation staff.  Then, the patient was set-up in a stable reproducible  supine position for radiation therapy.  CT images were obtained.  Surface markings were placed.  The CT images were loaded into the planning software.  Then the target and avoidance structures were contoured including kidneys.  Treatment planning then occurred.  The radiation prescription was entered and confirmed.  Then, I designed and supervised the construction of a total of 3 medically necessary complex treatment devices with VacLoc positioner and 2 MLCs to shield kidneys.  I have requested : 3D Simulation  I have requested a DVH of the following structures: Left Kidney, Right Kidney and target.  PLAN:  The patient will receive 30 Gy in 10 fractions.  ________________________________  Sheral Apley Tammi Klippel, M.D.

## 2017-07-14 NOTE — Progress Notes (Signed)
See progress note under physician encounter. 

## 2017-07-15 ENCOUNTER — Observation Stay (HOSPITAL_COMMUNITY)
Admission: EM | Admit: 2017-07-15 | Discharge: 2017-07-17 | Disposition: A | Discharge status: Skilled Nursing Facility | Payer: Medicare Other | Attending: Internal Medicine | Admitting: Internal Medicine

## 2017-07-15 ENCOUNTER — Ambulatory Visit (HOSPITAL_COMMUNITY): Payer: Medicare Other

## 2017-07-15 ENCOUNTER — Encounter (HOSPITAL_COMMUNITY): Payer: Self-pay

## 2017-07-15 ENCOUNTER — Other Ambulatory Visit: Payer: Self-pay

## 2017-07-15 ENCOUNTER — Ambulatory Visit: Payer: Medicare Other

## 2017-07-15 DIAGNOSIS — C3492 Malignant neoplasm of unspecified part of left bronchus or lung: Secondary | ICD-10-CM

## 2017-07-15 DIAGNOSIS — C3490 Malignant neoplasm of unspecified part of unspecified bronchus or lung: Secondary | ICD-10-CM | POA: Insufficient documentation

## 2017-07-15 DIAGNOSIS — M5442 Lumbago with sciatica, left side: Secondary | ICD-10-CM

## 2017-07-15 DIAGNOSIS — E538 Deficiency of other specified B group vitamins: Secondary | ICD-10-CM | POA: Diagnosis not present

## 2017-07-15 DIAGNOSIS — E860 Dehydration: Secondary | ICD-10-CM | POA: Insufficient documentation

## 2017-07-15 DIAGNOSIS — W19XXXA Unspecified fall, initial encounter: Secondary | ICD-10-CM | POA: Diagnosis not present

## 2017-07-15 DIAGNOSIS — M79604 Pain in right leg: Secondary | ICD-10-CM

## 2017-07-15 DIAGNOSIS — Z87891 Personal history of nicotine dependence: Secondary | ICD-10-CM | POA: Diagnosis not present

## 2017-07-15 DIAGNOSIS — E7800 Pure hypercholesterolemia, unspecified: Secondary | ICD-10-CM | POA: Diagnosis not present

## 2017-07-15 DIAGNOSIS — I10 Essential (primary) hypertension: Secondary | ICD-10-CM | POA: Diagnosis not present

## 2017-07-15 DIAGNOSIS — M48061 Spinal stenosis, lumbar region without neurogenic claudication: Secondary | ICD-10-CM | POA: Insufficient documentation

## 2017-07-15 DIAGNOSIS — M79605 Pain in left leg: Secondary | ICD-10-CM | POA: Diagnosis not present

## 2017-07-15 DIAGNOSIS — M5126 Other intervertebral disc displacement, lumbar region: Secondary | ICD-10-CM | POA: Insufficient documentation

## 2017-07-15 DIAGNOSIS — C7951 Secondary malignant neoplasm of bone: Secondary | ICD-10-CM | POA: Diagnosis not present

## 2017-07-15 DIAGNOSIS — R000 Tachycardia, unspecified: Secondary | ICD-10-CM | POA: Diagnosis present

## 2017-07-15 DIAGNOSIS — M4050 Lordosis, unspecified, site unspecified: Secondary | ICD-10-CM | POA: Insufficient documentation

## 2017-07-15 DIAGNOSIS — N3000 Acute cystitis without hematuria: Secondary | ICD-10-CM

## 2017-07-15 DIAGNOSIS — Z7982 Long term (current) use of aspirin: Secondary | ICD-10-CM | POA: Insufficient documentation

## 2017-07-15 DIAGNOSIS — E042 Nontoxic multinodular goiter: Secondary | ICD-10-CM | POA: Insufficient documentation

## 2017-07-15 DIAGNOSIS — R5383 Other fatigue: Secondary | ICD-10-CM

## 2017-07-15 DIAGNOSIS — M5124 Other intervertebral disc displacement, thoracic region: Secondary | ICD-10-CM | POA: Insufficient documentation

## 2017-07-15 DIAGNOSIS — M5441 Lumbago with sciatica, right side: Secondary | ICD-10-CM | POA: Diagnosis not present

## 2017-07-15 DIAGNOSIS — N179 Acute kidney failure, unspecified: Secondary | ICD-10-CM

## 2017-07-15 DIAGNOSIS — R296 Repeated falls: Secondary | ICD-10-CM

## 2017-07-15 DIAGNOSIS — E039 Hypothyroidism, unspecified: Secondary | ICD-10-CM | POA: Insufficient documentation

## 2017-07-15 DIAGNOSIS — C3491 Malignant neoplasm of unspecified part of right bronchus or lung: Secondary | ICD-10-CM

## 2017-07-15 DIAGNOSIS — N390 Urinary tract infection, site not specified: Secondary | ICD-10-CM | POA: Diagnosis present

## 2017-07-15 DIAGNOSIS — M47816 Spondylosis without myelopathy or radiculopathy, lumbar region: Secondary | ICD-10-CM | POA: Diagnosis not present

## 2017-07-15 DIAGNOSIS — M6281 Muscle weakness (generalized): Secondary | ICD-10-CM

## 2017-07-15 LAB — HEPATIC FUNCTION PANEL
ALT: 13 U/L — AB (ref 14–54)
AST: 31 U/L (ref 15–41)
Albumin: 4.1 g/dL (ref 3.5–5.0)
Alkaline Phosphatase: 80 U/L (ref 38–126)
BILIRUBIN DIRECT: 0.6 mg/dL — AB (ref 0.1–0.5)
BILIRUBIN TOTAL: 1.5 mg/dL — AB (ref 0.3–1.2)
Indirect Bilirubin: 0.9 mg/dL (ref 0.3–0.9)
Total Protein: 7.5 g/dL (ref 6.5–8.1)

## 2017-07-15 LAB — BASIC METABOLIC PANEL
Anion gap: 10 (ref 5–15)
BUN: 24 mg/dL — ABNORMAL HIGH (ref 6–20)
CALCIUM: 10.2 mg/dL (ref 8.9–10.3)
CO2: 24 mmol/L (ref 22–32)
CREATININE: 1.3 mg/dL — AB (ref 0.44–1.00)
Chloride: 100 mmol/L — ABNORMAL LOW (ref 101–111)
GFR, EST AFRICAN AMERICAN: 45 mL/min — AB (ref >60)
GFR, EST NON AFRICAN AMERICAN: 39 mL/min — AB (ref >60)
Glucose, Bld: 80 mg/dL (ref 65–99)
Potassium: 5.2 mmol/L — ABNORMAL HIGH (ref 3.5–5.1)
SODIUM: 134 mmol/L — AB (ref 135–145)

## 2017-07-15 LAB — CBC
HCT: 33.9 % — ABNORMAL LOW (ref 36.0–46.0)
Hemoglobin: 11.4 g/dL — ABNORMAL LOW (ref 12.0–15.0)
MCH: 29.9 pg (ref 26.0–34.0)
MCHC: 33.6 g/dL (ref 30.0–36.0)
MCV: 89 fL (ref 78.0–100.0)
PLATELETS: 216 10*3/uL (ref 150–400)
RBC: 3.81 MIL/uL — AB (ref 3.87–5.11)
RDW: 14.4 % (ref 11.5–15.5)
WBC: 11.9 10*3/uL — AB (ref 4.0–10.5)

## 2017-07-15 LAB — URINALYSIS, ROUTINE W REFLEX MICROSCOPIC
Bilirubin Urine: NEGATIVE
GLUCOSE, UA: NEGATIVE mg/dL
KETONES UR: 20 mg/dL — AB
Nitrite: POSITIVE — AB
PH: 5 (ref 5.0–8.0)
Protein, ur: 30 mg/dL — AB
Specific Gravity, Urine: 1.026 (ref 1.005–1.030)

## 2017-07-15 LAB — PROTIME-INR
INR: 1.05
Prothrombin Time: 13.6 seconds (ref 11.4–15.2)

## 2017-07-15 LAB — LIPASE, BLOOD: Lipase: 30 U/L (ref 11–51)

## 2017-07-15 MED ORDER — LISINOPRIL 20 MG PO TABS
40.0000 mg | ORAL_TABLET | Freq: Every day | ORAL | Status: DC
Start: 2017-07-16 — End: 2017-07-17
  Administered 2017-07-16 – 2017-07-17 (×2): 40 mg via ORAL
  Filled 2017-07-15 (×3): qty 2

## 2017-07-15 MED ORDER — MIRTAZAPINE 15 MG PO TABS
7.5000 mg | ORAL_TABLET | Freq: Every day | ORAL | Status: DC
  Administered 2017-07-15 – 2017-07-16 (×2): 7.5 mg via ORAL
  Filled 2017-07-15 (×2): qty 1

## 2017-07-15 MED ORDER — ACETAMINOPHEN 650 MG RE SUPP: 650.0000 mg | Freq: Four times a day (QID) | RECTAL | Status: DC | PRN

## 2017-07-15 MED ORDER — TRAMADOL HCL 50 MG PO TABS
50.0000 mg | ORAL_TABLET | Freq: Four times a day (QID) | ORAL | Status: DC | PRN
  Administered 2017-07-15 – 2017-07-16 (×3): 50 mg via ORAL
  Filled 2017-07-15 (×3): qty 1

## 2017-07-15 MED ORDER — SODIUM CHLORIDE 0.9 % IV BOLUS (SEPSIS)
500.0000 mL | Freq: Once | INTRAVENOUS | Status: AC
  Administered 2017-07-15: 500 mL via INTRAVENOUS

## 2017-07-15 MED ORDER — AMLODIPINE BESYLATE 10 MG PO TABS
10.0000 mg | ORAL_TABLET | Freq: Every day | ORAL | Status: DC
  Administered 2017-07-16 – 2017-07-17 (×2): 10 mg via ORAL
  Filled 2017-07-15 (×3): qty 1

## 2017-07-15 MED ORDER — ACETAMINOPHEN 325 MG PO TABS: 650.0000 mg | ORAL_TABLET | Freq: Four times a day (QID) | ORAL | Status: DC | PRN

## 2017-07-15 MED ORDER — ASPIRIN EC 81 MG PO TBEC
81.0000 mg | DELAYED_RELEASE_TABLET | Freq: Every day | ORAL | Status: DC
  Administered 2017-07-15 – 2017-07-17 (×3): 81 mg via ORAL
  Filled 2017-07-15 (×4): qty 1

## 2017-07-15 MED ORDER — DORZOLAMIDE HCL-TIMOLOL MAL 2-0.5 % OP SOLN
1.0000 [drp] | Freq: Two times a day (BID) | OPHTHALMIC | Status: DC
  Administered 2017-07-15 – 2017-07-16 (×2): 1 [drp] via OPHTHALMIC
  Filled 2017-07-15: qty 10

## 2017-07-15 MED ORDER — ONDANSETRON HCL 4 MG PO TABS: 4.0000 mg | ORAL_TABLET | Freq: Four times a day (QID) | ORAL | Status: DC | PRN

## 2017-07-15 MED ORDER — SODIUM CHLORIDE 0.9 % IV SOLN
INTRAVENOUS | Status: DC
  Administered 2017-07-15 – 2017-07-16 (×3): via INTRAVENOUS

## 2017-07-15 MED ORDER — DEXTROSE 5 % IV SOLN
1.0000 g | INTRAVENOUS | Status: DC
  Administered 2017-07-16: 1 g via INTRAVENOUS
  Filled 2017-07-15 (×2): qty 10

## 2017-07-15 MED ORDER — ONDANSETRON HCL 4 MG/2ML IJ SOLN: 4.0000 mg | Freq: Four times a day (QID) | INTRAMUSCULAR | Status: DC | PRN

## 2017-07-15 MED ORDER — ENOXAPARIN SODIUM 30 MG/0.3ML ~~LOC~~ SOLN
30.0000 mg | SUBCUTANEOUS | Status: DC
  Administered 2017-07-15: 30 mg via SUBCUTANEOUS
  Filled 2017-07-15: qty 0.3

## 2017-07-15 MED ORDER — DEXTROSE 5 % IV SOLN
1.0000 g | Freq: Once | INTRAVENOUS | Status: AC
  Administered 2017-07-15: 1 g via INTRAVENOUS
  Filled 2017-07-15: qty 10

## 2017-07-15 MED ORDER — CLONAZEPAM 0.5 MG PO TABS
0.5000 mg | ORAL_TABLET | Freq: Every day | ORAL | Status: DC
  Administered 2017-07-15 – 2017-07-16 (×2): 0.5 mg via ORAL
  Filled 2017-07-15 (×2): qty 1

## 2017-07-15 MED ORDER — LEVOTHYROXINE SODIUM 88 MCG PO TABS
88.0000 ug | ORAL_TABLET | Freq: Every day | ORAL | Status: DC
  Administered 2017-07-16 – 2017-07-17 (×2): 88 ug via ORAL
  Filled 2017-07-15 (×2): qty 1

## 2017-07-15 NOTE — ED Notes (Signed)
ED TO INPATIENT HANDOFF REPORT  Name/Age/Gender [name] 76 y.o. female  Code Status Code Status History    Date Active Date Inactive Code Status Order ID Comments User Context   05/24/2014  5:53 AM 05/26/2014  5:14 PM Full Code [id number]  Karie Kirks, DO Inpatient    Advance Directive Documentation     Most Recent Value  Type of Advance Directive  Healthcare Power of Attorney, Living will  Pre-existing out of facility DNR order (yellow form or pink MOST form)  -  "MOST" Form in Place?  -      Home/SNF/Other Home  Chief Complaint Weakness  Level of Care/Admitting Diagnosis ED Disposition    ED Disposition Condition [place]: [place] [100102]  Level of Care: Med-Surg [16]  Diagnosis: UTI (urinary tract infection) [628366]  Admitting Physician: [place], [place]  Attending Physician: Oswald Hillock [[id number]  PT Class (Do Not Modify): Observation [104]  PT Acc Code (Do Not Modify): Observation [10022]       Medical History Past Medical History:  Diagnosis Date  . Bilateral lung cancer ([place]) 03/08/2011   recurrent  . Bone cancer ([place])    lung ca with spine mets  . Chronic fatigue 02/14/2016  . Dysuria 03/04/2017  . Encounter for antineoplastic immunotherapy 02/14/2016  . Foot pain 09/02/2011  . Glaucoma   . Hip pain 09/02/2011  . Hypercholesterolemia   . Hypertension   . Hypertension 10/29/2016  . Hypothyroidism   . Lung cancer ([place]) 03/08/2011   recurrent    Allergies Allergies  Allergen Reactions  . Cymbalta [Duloxetine Hcl] Other (See Comments)    Involuntary movements " turned me completely around"  . Fish Allergy Diarrhea  . Amitriptyline     Initial reaction about 25 years ago.  . Codeine Nausea And Vomiting    vomiting  . Mirabegron Hypertension  . Solifenacin Other (See Comments)    Due to glaucoma  . Sulfa Antibiotics Nausea And Vomiting    IV Location/Drains/Wounds Patient  Lines/Drains/Airways Status   Active Line/Drains/Airways    Name:   Placement date:   Placement time:   Site:   Days:   Implanted Port 04/30/11 Right Chest  04/30/11        Chest    2268   Peripheral IV 07/15/17 Right Antecubital  07/15/17    1310    Antecubital    less than 1          Labs/Imaging Results for orders placed or performed during the hospital encounter of 07/15/17 (from the past 48 hour(s))  Urinalysis, Routine w reflex microscopic     Status: Abnormal   Collection Time: 07/15/17 11:46 AM  Result Value Ref Range   Color, Urine AMBER (A) YELLOW    Comment: BIOCHEMICALS MAY BE AFFECTED BY COLOR   APPearance HAZY (A) CLEAR   Specific Gravity, Urine 1.026 1.005 - 1.030   pH 5.0 5.0 - 8.0   Glucose, UA NEGATIVE NEGATIVE mg/dL   Hgb urine dipstick SMALL (A) NEGATIVE   Bilirubin Urine NEGATIVE NEGATIVE   Ketones, ur 20 (A) NEGATIVE mg/dL   Protein, ur 30 (A) NEGATIVE mg/dL   Nitrite POSITIVE (A) NEGATIVE   Leukocytes, UA MODERATE (A) NEGATIVE   RBC / HPF 0-5 0 - 5 RBC/hpf   WBC, UA 6-30 0 - 5 WBC/hpf   Bacteria, UA FEW (A) NONE SEEN   Squamous Epithelial / LPF 0-5 (A) NONE SEEN  Budding Yeast PRESENT   Basic metabolic panel     Status: Abnormal   Collection Time: 07/15/17  1:12 PM  Result Value Ref Range   Sodium 134 (L) 135 - 145 mmol/L   Potassium 5.2 (H) 3.5 - 5.1 mmol/L    Comment: SLIGHT HEMOLYSIS   Chloride 100 (L) 101 - 111 mmol/L   CO2 24 22 - 32 mmol/L   Glucose, Bld 80 65 - 99 mg/dL   BUN 24 (H) 6 - 20 mg/dL   Creatinine, Ser 1.30 (H) 0.44 - 1.00 mg/dL   Calcium 10.2 8.9 - 10.3 mg/dL   GFR calc non Af Amer 39 (L) >60 mL/min   GFR calc Af Amer 45 (L) >60 mL/min    Comment: (NOTE) The eGFR has been calculated using the CKD EPI equation. This calculation has not been validated in all clinical situations. eGFR's persistently <60 mL/min signify possible Chronic Kidney Disease.    Anion gap 10 5 - 15  CBC     Status: Abnormal   Collection Time:  07/15/17  1:12 PM  Result Value Ref Range   WBC 11.9 (H) 4.0 - 10.5 K/uL   RBC 3.81 (L) 3.87 - 5.11 MIL/uL   Hemoglobin 11.4 (L) 12.0 - 15.0 g/dL   HCT 33.9 (L) 36.0 - 46.0 %   MCV 89.0 78.0 - 100.0 fL   MCH 29.9 26.0 - 34.0 pg   MCHC 33.6 30.0 - 36.0 g/dL   RDW 14.4 11.5 - 15.5 %   Platelets 216 150 - 400 K/uL  Hepatic function panel     Status: Abnormal   Collection Time: 07/15/17  1:12 PM  Result Value Ref Range   Total Protein 7.5 6.5 - 8.1 g/dL   Albumin 4.1 3.5 - 5.0 g/dL   AST 31 15 - 41 U/L   ALT 13 (L) 14 - 54 U/L   Alkaline Phosphatase 80 38 - 126 U/L   Total Bilirubin 1.5 (H) 0.3 - 1.2 mg/dL   Bilirubin, Direct 0.6 (H) 0.1 - 0.5 mg/dL   Indirect Bilirubin 0.9 0.3 - 0.9 mg/dL  Lipase, blood     Status: None   Collection Time: 07/15/17  1:12 PM  Result Value Ref Range   Lipase 30 11 - 51 U/L  Protime-INR     Status: None   Collection Time: 07/15/17  2:06 PM  Result Value Ref Range   Prothrombin Time 13.6 11.4 - 15.2 seconds   INR 1.05    *Note: Due to a large number of results and/or encounters for the requested time period, some results have not been displayed. A complete set of results can be found in Results Review.   No results found.  Pending Labs [facility]     Ordered   07/15/17 1514  Blood culture (routine x 2)  BLOOD CULTURE X 2,   STAT     07/15/17 1513   07/15/17 1324  Urine culture  STAT,   STAT     07/15/17 1323   Signed and Held  CBC  (enoxaparin (LOVENOX)    CrCl >/= 30 ml/min)  Once,   R    Comments:  Baseline for enoxaparin therapy IF NOT ALREADY DRAWN.  Notify MD if PLT < 100 K.    Signed and Held   Signed and Held  Creatinine, serum  (enoxaparin (LOVENOX)    CrCl >/= 30 ml/min)  Once,   R    Comments:  Baseline for  enoxaparin therapy IF NOT ALREADY DRAWN.    Signed and Held   Signed and Held  Creatinine, serum  (enoxaparin (LOVENOX)    CrCl >/= 30 ml/min)  Weekly,   R    Comments:  while on enoxaparin therapy    Signed  and Held   Signed and Held  CBC  Tomorrow morning,   R     Signed and Held   Signed and Held  Comprehensive metabolic panel  Tomorrow morning,   R     Signed and Held      Vitals/Pain Today's Vitals   07/15/17 1630 07/15/17 1715 07/15/17 1730 07/15/17 1732  BP:   140/81 140/81  Pulse: 95 91 96 99  Resp: 18 (!) 21 (!) 22 (!) 26  Temp:      TempSrc:      SpO2: 93% 98% 93% 98%  Weight:      Height:      PainSc:        Isolation Precautions No active isolations  Medications Medications  sodium chloride 0.9 % bolus 500 mL (0 mLs Intravenous Stopped 07/15/17 1522)  cefTRIAXone (ROCEPHIN) 1 g in dextrose 5 % 50 mL IVPB (0 g Intravenous Stopped 07/15/17 1615)    Mobility Ambulatory with walker

## 2017-07-15 NOTE — ED Notes (Signed)
Pt given soup and a sandwich.  Tolerating well at this time.

## 2017-07-15 NOTE — ED Provider Notes (Signed)
[facility] DEPT Provider Note   CSN: [id number] Arrival date & time: 07/15/17  1124     History   Chief Complaint Chief Complaint  Patient presents with  . CA Pt  . Weakness  . Emesis    HPI [name] is a 76 y.o. female.  The history is provided by the patient and medical records.  Emesis   This is a new problem. The current episode started yesterday. The problem occurs 2 to 4 times per day. The problem has been rapidly improving. The emesis has an appearance of stomach contents. There has been no fever. Pertinent negatives include no abdominal pain, no chills, no cough, no diarrhea, no fever, no headaches and no URI.    Past Medical History:  Diagnosis Date  . Bilateral lung cancer ([place]) 03/08/2011   recurrent  . Bone cancer ([place])    lung ca with spine mets  . Chronic fatigue 02/14/2016  . Dysuria 03/04/2017  . Encounter for antineoplastic immunotherapy 02/14/2016  . Foot pain 09/02/2011  . Glaucoma   . Hip pain 09/02/2011  . Hypercholesterolemia   . Hypertension   . Hypertension 10/29/2016  . Hypothyroidism   . Lung cancer ([place]) 03/08/2011   recurrent    Patient Active Problem List   Diagnosis Date Noted  . 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 Muscle weakness (generalized) 06/06/2017  . Multiple falls 06/06/2017  . DDD (degenerative disc disease), lumbosacral 05/15/2017  . Incontinence of feces 04/23/2017  . Vaginal atrophy 04/23/2017  . Voiding dysfunction 04/23/2017  . Recurrent UTI 04/23/2017  . Dysuria 03/04/2017  . Hypertension 10/29/2016  . Encounter for antineoplastic immunotherapy 02/14/2016  . Chronic fatigue 02/14/2016  . Port catheter in place 02/09/2016  . Urine frequency 07/06/2014  . Diarrhea 05/24/2014  . Nausea and vomiting 05/24/2014  . Sepsis ([place]) 05/24/2014  . Hypothermia 05/24/2014  . Hypokalemia 05/24/2014  . Nausea vomiting and diarrhea 05/24/2014  . Proteinuria 04/28/2012  . Vitamin B12 deficiency 03/17/2012    . Unspecified deficiency anemia 12/02/2011  . 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 Multinodular goiter 11/29/2011  . Numbness 11/29/2011  . Acute pulmonary embolism ([place]) 11/04/2011  . Acute pulmonary embolism ([place]) 11/04/2011  . Hip pain 09/02/2011  . Foot pain 09/02/2011  . Bilateral lung cancer ([place]) 03/08/2011    Past Surgical History:  Procedure Laterality Date  . LUNG LOBECTOMY  09/28/2009   right upper  . THORACOTOMY  09/28/2009   mini  . VIDEO ASSISTED THORACOSCOPY  09/28/2009    OB History    No data available       Home Medications    Prior to Admission medications   Medication Sig Start Date End Date Taking? Authorizing Provider  acetaminophen (TYLENOL) 325 MG tablet Take 650 mg by mouth every 6 (six) hours as needed for moderate pain.    Yes [provider]  amLODipine (NORVASC) 5 MG tablet Take 10 mg by mouth daily. Per [name], NP   Yes [provider]  aspirin 81 MG tablet Take 81 mg by mouth daily.   Yes [provider]  cephALEXin (KEFLEX) 250 MG capsule TAKE ONE CAPSULE BY MOUTH AT BEDTIME 04/23/17  Yes [provider]  clonazePAM (KLONOPIN) 0.5 MG tablet Take 0.5 mg by mouth at bedtime. Per [name], NP   Yes [provider]  cyanocobalamin (,VITAMIN B-12,) 1000 MCG/ML injection Inject 1,000 mcg into the muscle every 30 (thirty) days.   Yes [provider]  dorzolamide-timolol (COSOPT) 22.3-6.8 MG/ML ophthalmic solution Place 2 drops into both eyes 2 (two) times daily. Per [name], NP   Yes [provider]  fluticasone (FLONASE) 50 MCG/ACT nasal spray Place 1 spray into both nostrils daily as needed for allergies or rhinitis.   Yes [provider]  levothyroxine (SYNTHROID, LEVOTHROID) 88 MCG tablet Take 1 tablet (88 mcg total) by mouth daily before breakfast. 06/18/17  Yes Jeffery, Chelle, PA-C  lidocaine-prilocaine (EMLA) cream Apply 1 application topically as  needed (prior to port access). 03/05/16  Yes Curt Bears, MD  lisinopril (PRINIVIL,ZESTRIL) 40 MG tablet Take 1 tablet (40 mg total) by mouth daily. 06/18/17  Yes Jeffery, Chelle, PA-C  PREMARIN vaginal cream INSERT BLUEBERRY SIZE AMOUNT OF ESTROGEN CREAM INTO VAGINA TWICE WEEKLY. 04/25/17  Yes [provider]  traMADol (ULTRAM) 50 MG tablet Take 1-2 tablets (50-100 mg total) by mouth every 6 (six) hours as needed for severe pain. 06/06/17  Yes Jeffery, Chelle, PA-C  meloxicam (MOBIC) 7.5 MG tablet TAKE 1 TABLET BY MOUTH EVERY DAY Patient taking differently: TAKE 1 TABLET BY MOUTH EVERY DAY AS NEEDED FOR PAIN 06/09/17   Harrison Mons, PA-C  mirtazapine (REMERON) 7.5 MG tablet Take 1 tablet (7.5 mg total) by mouth at bedtime. Patient not taking: Reported on 07/14/2017 07/04/17   Harrison Mons, PA-C    Family History Family History  Problem Relation Age of Onset  . Stroke Father   . Heart disease Father 18       MI  . Cancer Brother        lung  . Cancer Cousin        unknown    Social History Social History  Substance Use Topics  . Smoking status: Former Smoker    Packs/day: 1.00    Years: 50.00    Types: Cigarettes    Quit date: 09/28/2009  . Smokeless tobacco: Never Used  . Alcohol use No     Allergies   Cymbalta [duloxetine hcl]; Fish allergy; Amitriptyline; Codeine; Mirabegron; Solifenacin; and Sulfa antibiotics   Review of Systems Review of Systems  Constitutional: Positive for fatigue. Negative for chills, diaphoresis and fever.  HENT: Negative for congestion.   Respiratory: Negative for cough, chest tightness and shortness of breath.   Cardiovascular: Negative for chest pain, palpitations and leg swelling.  Gastrointestinal: Positive for nausea and vomiting. Negative for abdominal distention, abdominal pain, constipation and diarrhea.  Genitourinary: Negative for dysuria and flank pain.  Musculoskeletal: Positive for back pain. Negative for neck pain and  neck stiffness.  Skin: Negative for rash and wound.  Neurological: Negative for light-headedness and headaches.  Psychiatric/Behavioral: Negative for agitation.  All other systems reviewed and are negative.    Physical Exam Updated Vital Signs BP 131/69 (BP Location: Left Arm)   Pulse (!) 102   Temp 97.8 F (36.6 C) (Oral)   Resp 16   Ht 5\' 5"  (1.651 m)   Wt 49.9 kg (110 lb)   SpO2 99%   BMI 18.30 kg/m   Physical Exam  Constitutional: She is oriented to person, place, and time. She appears well-developed and well-nourished. No distress.  HENT:  Head: Normocephalic.  Mouth/Throat: Oropharynx is clear and moist. No oropharyngeal exudate.  Eyes: Pupils are equal, round, and reactive to light. Conjunctivae and EOM are normal.  Neck: Normal range of motion.  Cardiovascular: Normal rate and intact distal pulses.   No murmur heard. Pulmonary/Chest: Effort normal. No stridor. No respiratory distress. She has no wheezes.  She exhibits no tenderness.  Abdominal: Bowel sounds are normal. There is no tenderness.  Neurological: She is alert and oriented to person, place, and time. No cranial nerve deficit or sensory deficit. She exhibits normal muscle tone.  Skin: Capillary refill takes less than 2 seconds. No rash noted. She is not diaphoretic. No erythema.  Psychiatric: She has a normal mood and affect.  Nursing note and vitals reviewed.    ED Treatments / Results  Labs (all labs ordered are listed, but only abnormal results are displayed) Labs Reviewed  BASIC METABOLIC PANEL - Abnormal; Notable for the following:       Result Value   Sodium 134 (*)    Potassium 5.2 (*)    Chloride 100 (*)    BUN 24 (*)    Creatinine, Ser 1.30 (*)    GFR calc non Af Amer 39 (*)    GFR calc Af Amer 45 (*)    All other components within normal limits  CBC - Abnormal; Notable for the following:    WBC 11.9 (*)    RBC 3.81 (*)    Hemoglobin 11.4 (*)    HCT 33.9 (*)    All other components  within normal limits  URINALYSIS, ROUTINE W REFLEX MICROSCOPIC - Abnormal; Notable for the following:    Color, Urine AMBER (*)    APPearance HAZY (*)    Hgb urine dipstick SMALL (*)    Ketones, ur 20 (*)    Protein, ur 30 (*)    Nitrite POSITIVE (*)    Leukocytes, UA MODERATE (*)    Bacteria, UA FEW (*)    Squamous Epithelial / LPF 0-5 (*)    All other components within normal limits  HEPATIC FUNCTION PANEL - Abnormal; Notable for the following:    ALT 13 (*)    Total Bilirubin 1.5 (*)    Bilirubin, Direct 0.6 (*)    All other components within normal limits  URINE CULTURE  CULTURE, BLOOD (ROUTINE X 2)  CULTURE, BLOOD (ROUTINE X 2)  LIPASE, BLOOD  PROTIME-INR  CBC  COMPREHENSIVE METABOLIC PANEL    EKG  EKG Interpretation None       Radiology No results found.  Procedures Procedures (including critical care time)  Medications Ordered in ED Medications  mirtazapine (REMERON) tablet 7.5 mg (not administered)  levothyroxine (SYNTHROID, LEVOTHROID) tablet 88 mcg (not administered)  lisinopril (PRINIVIL,ZESTRIL) tablet 40 mg (not administered)  traMADol (ULTRAM) tablet 50 mg (not administered)  aspirin EC tablet 81 mg (not administered)  amLODipine (NORVASC) tablet 10 mg (not administered)  clonazePAM (KLONOPIN) tablet 0.5 mg (not administered)  dorzolamide-timolol (COSOPT) 22.3-6.8 MG/ML ophthalmic solution 1 drop (not administered)  enoxaparin (LOVENOX) injection 30 mg (not administered)  0.9 %  sodium chloride infusion ( Intravenous New Bag/Given 07/15/17 1832)  acetaminophen (TYLENOL) tablet 650 mg (not administered)    Or  acetaminophen (TYLENOL) suppository 650 mg (not administered)  ondansetron (ZOFRAN) tablet 4 mg (not administered)    Or  ondansetron (ZOFRAN) injection 4 mg (not administered)  cefTRIAXone (ROCEPHIN) 1 g in dextrose 5 % 50 mL IVPB (not administered)  sodium chloride 0.9 % bolus 500 mL (0 mLs Intravenous Stopped 07/15/17 1522)  cefTRIAXone  (ROCEPHIN) 1 g in dextrose 5 % 50 mL IVPB (0 g Intravenous Stopped 07/15/17 1615)     Initial Impression / Assessment and Plan / ED Course  I have reviewed the triage vital signs and the nursing notes.  Pertinent labs & imaging results that  were available during my care of the patient were reviewed by me and considered in my medical decision making (see chart for details).     [name] is a 76 y.o. female with a past medical history significant for hypertension, hypothyroidism, lung cancer with spinal metastasis currently on chemotherapy and radiation therapy who presents with multiple falls, chills, nausea, vomiting, dehydration, decreased oral intake, and fatigue. Patient is accompanied by sister. Patient reports that she was diagnosed with spinal metastasis from her lung cancer recently and had her first radiation treatment yesterday. She says that her PCP recommended she get admitted several days ago due to bilateral leg pain, low back pain,falls, fatigue, concern for dehydration. Patient says that she did not have and will care for her dog over the weekend so she did not want to be admitted however, she is now secured a Art therapist. She says that she had 3 falls yesterday and is still concerned about her safety at home where she lives alone. Patient says she occasionally feels her legs get weak although she does not feel weak currently. She reports she has intermittent pains in her back in her legs. She denies current severe pain. She says that she's had no fevers but does report chills. She denies cough, consultation, diarrhea. She denies any urinary symptoms today. Patient reports that she had imaging of her head that did not show metastasis to the brain.  Patient says that she is not eating and drink very much and on arrival, her heart rate was in the 120s.   On my exam, patient had no significant tenderness in her legs or back. Lungs are clear. Chest is nontender. Abdomen is nontender.  No focal neurologic deficits were seen. Patient was able to raise both her legs. Due to the multiple falls, patient was not walked at this time.   Patient will have workup to look for occult infection given her chills, fatigue, and decreased oral intake. She will also have workup to look for dehydration given the decreased intake. Anticipate following up on results to determine if patient is safe for discharge home.   Pt found to have UTI with nitrites. Antibiotics ordered.   PT remained tachycardia, fluids given.   PT will be admitted for UTI, dehydration, N/V, continued pain, and falls. Hospitalist service called for admission.     Final Clinical Impressions(s) / ED Diagnoses   Final diagnoses:  Fall, initial encounter  Acute cystitis without hematuria  Leg pain, bilateral  Dehydration  Tachycardia  Fatigue, unspecified type      Clinical Impression: 1. Fall, initial encounter   2. Acute cystitis without hematuria   3. Leg pain, bilateral   4. Dehydration   5. Tachycardia   6. Fatigue, unspecified type     Disposition: Admit to Hospitalist service    Tegeler, Gwenyth Allegra, MD 07/15/17 1910

## 2017-07-15 NOTE — Progress Notes (Signed)
Pharmacy Antibiotic Note  [name] is a 76 y.o. 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  To start ceftriaxone for UTI.  - UA: few bacteria, positive nitrite, moderate leuk  Plan: - Ceftriaxone 1 gm IV q24h - No renal adjustment is needed with Ceftriaxone -- pharmacy will sign off - Re-consult [place] if need further assistance  Thank you for asking pharmacy to be part of this patient's care. ___________________________________  Height: 5\' 5"  (165.1 cm) Weight: 113 lb 5.1 oz (51.4 kg) IBW/kg (Calculated) : 57  Temp (24hrs), Avg:98.1 F (36.7 C), Min:97.8 F (36.6 C), Max:98.3 F (36.8 C)   Recent Labs Lab 07/15/17 1312  WBC 11.9*  CREATININE 1.30*    Estimated Creatinine Clearance: 29.4 mL/min (A) (by C-G formula based on SCr of 1.3 mg/dL (H)).    Allergies  Allergen Reactions  . Cymbalta [Duloxetine Hcl] Other (See Comments)    Involuntary movements " turned me completely around"  . Fish Allergy Diarrhea  . Amitriptyline     Initial reaction about 25 years ago.  . Codeine Nausea And Vomiting    vomiting  . Mirabegron Hypertension  . Solifenacin Other (See Comments)    Due to glaucoma  . Sulfa Antibiotics Nausea And Vomiting     Lynelle Doctor 07/15/2017 6:47 PM

## 2017-07-15 NOTE — ED Triage Notes (Signed)
Pt c/o increasing weakness, BLE pain, and falls since July and n/v x 2 day.  Pain score 5/10.  Hx of lung CA w/ mets to spine.  Pt reports last "infusion" x 1 week ago and radiation yesterday.  Pt reports taking tramadol for pain w/ some relief.  Pt sts she found out about spinal mets yesterday.

## 2017-07-15 NOTE — ED Notes (Signed)
ED Provider at bedside. 

## 2017-07-15 NOTE — H&P (Signed)
[facility] H&P    Patient Demographics:    [name], is a 76 y.o. female  MRN: [id number]  DOB - 06-01-1941  Admit Date - 07/15/2017  Referring MD/NP/PA: Dr Tegeler  Outpatient Primary MD for the patient is Harrison Mons, PA-C  Patient coming from: Home  Chief Complaint  Patient presents with  . CA Pt  . Weakness  . Emesis      HPI:    [name]  is a 76 y.o. female, With history of recurrent non-small cell lung cancer, status post right upper lobectomy in 2010, recurrence of disease and MRI of lumbar spine showed metastasis at L3 L5 S1 with tumor expansion of L3 vertebral body. Patient underwent evaluation for admission therapy yesterday, and plan was to start radiation treatment today. Patient came to hospital with generalized weakness and falls at home. She called her PCP who told her to go to ED for further evaluation. In the ED patient found to have abnormal UA, though she has history of recurrent UTIs in the past. She denies dysuria. No chest pain or shortness of breath. No nausea vomiting or diarrhea. Pain is controlled with tramadol      Review of systems:    In addition to the HPI above,  No Fever-chills, No Headache, No changes with Vision or hearing, No problems swallowing food or Liquids, No Chest pain, Cough or Shortness of Breath, No Abdominal pain, No Nausea or Vomiting, bowel movements are regular, No Blood in stool or Urine, No dysuria, No new skin rashes or bruises, + Weight loss  No polyuria, polydypsia or polyphagia, No significant Mental Stressors.  All other systems reviewed and are negative.   With Past History of the following :    Past Medical History:  Diagnosis Date  . Bilateral lung cancer ([place]) 03/08/2011   recurrent  . Bone cancer ([place])    lung ca with spine mets  . Chronic fatigue 02/14/2016  . Dysuria 03/04/2017  . Encounter for antineoplastic  immunotherapy 02/14/2016  . Foot pain 09/02/2011  . Glaucoma   . Hip pain 09/02/2011  . Hypercholesterolemia   . Hypertension   . Hypertension 10/29/2016  . Hypothyroidism   . Lung cancer ([place]) 03/08/2011   recurrent      Past Surgical History:  Procedure Laterality Date  . LUNG LOBECTOMY  09/28/2009   right upper  . THORACOTOMY  09/28/2009   mini  . VIDEO ASSISTED THORACOSCOPY  09/28/2009      Social History:      Social History  Substance Use Topics  . Smoking status: Former Smoker    Packs/day: 1.00    Years: 50.00    Types: Cigarettes    Quit date: 09/28/2009  . Smokeless tobacco: Never Used  . Alcohol use No       Family History :     Family History  Problem Relation Age of Onset  . Stroke Father   . Heart disease Father 86       MI  . Cancer Brother  lung  . Cancer Cousin        unknown      Home Medications:   Prior to Admission medications   Medication Sig Start Date End Date Taking? Authorizing Provider  acetaminophen (TYLENOL) 325 MG tablet Take 650 mg by mouth every 6 (six) hours as needed for moderate pain.    Yes [provider]  amLODipine (NORVASC) 5 MG tablet Take 10 mg by mouth daily. Per [name], NP   Yes [provider]  aspirin 81 MG tablet Take 81 mg by mouth daily.   Yes [provider]  cephALEXin (KEFLEX) 250 MG capsule TAKE ONE CAPSULE BY MOUTH AT BEDTIME 04/23/17  Yes [provider]  clonazePAM (KLONOPIN) 0.5 MG tablet Take 0.5 mg by mouth at bedtime. Per [name], NP   Yes [provider]  cyanocobalamin (,VITAMIN B-12,) 1000 MCG/ML injection Inject 1,000 mcg into the muscle every 30 (thirty) days.   Yes [provider]  dorzolamide-timolol (COSOPT) 22.3-6.8 MG/ML ophthalmic solution Place 2 drops into both eyes 2 (two) times daily. Per [name], NP   Yes [provider]  fluticasone (FLONASE) 50 MCG/ACT nasal spray Place 1 spray into both  nostrils daily as needed for allergies or rhinitis.   Yes [provider]  levothyroxine (SYNTHROID, LEVOTHROID) 88 MCG tablet Take 1 tablet (88 mcg total) by mouth daily before breakfast. 06/18/17  Yes Jeffery, Chelle, PA-C  lidocaine-prilocaine (EMLA) cream Apply 1 application topically as needed (prior to port access). 03/05/16  Yes Curt Bears, MD  lisinopril (PRINIVIL,ZESTRIL) 40 MG tablet Take 1 tablet (40 mg total) by mouth daily. 06/18/17  Yes Jeffery, Chelle, PA-C  PREMARIN vaginal cream INSERT BLUEBERRY SIZE AMOUNT OF ESTROGEN CREAM INTO VAGINA TWICE WEEKLY. 04/25/17  Yes [provider]  traMADol (ULTRAM) 50 MG tablet Take 1-2 tablets (50-100 mg total) by mouth every 6 (six) hours as needed for severe pain. 06/06/17  Yes Jeffery, Chelle, PA-C  meloxicam (MOBIC) 7.5 MG tablet TAKE 1 TABLET BY MOUTH EVERY DAY Patient taking differently: TAKE 1 TABLET BY MOUTH EVERY DAY AS NEEDED FOR PAIN 06/09/17   Harrison Mons, PA-C  mirtazapine (REMERON) 7.5 MG tablet Take 1 tablet (7.5 mg total) by mouth at bedtime. Patient not taking: Reported on 07/14/2017 07/04/17   Harrison Mons, PA-C     Allergies:     Allergies  Allergen Reactions  . Cymbalta [Duloxetine Hcl] Other (See Comments)    Involuntary movements " turned me completely around"  . Fish Allergy Diarrhea  . Amitriptyline     Initial reaction about 25 years ago.  . Codeine Nausea And Vomiting    vomiting  . Mirabegron Hypertension  . Solifenacin Other (See Comments)    Due to glaucoma  . Sulfa Antibiotics Nausea And Vomiting     Physical Exam:   Vitals  Blood pressure 139/68, pulse 95, temperature 97.8 F (36.6 C), temperature source Oral, resp. rate 18, height 5\' 5"  (1.651 m), weight 49.9 kg (110 lb), SpO2 93 %.  1.  General: Appears in no acute distress  2. Psychiatric:  Intact judgement and  insight, awake alert, oriented x 3.  3. Neurologic: No focal neurological deficits, all cranial nerves  intact.Strength 5/5 all 4 extremities, sensation intact all 4 extremities, plantars down going.  4. Eyes :  anicteric sclerae, moist conjunctivae with no lid lag. PERRLA.  5. ENMT:  Oropharynx clear with moist mucous membranes and good dentition  6. Neck:  supple, no cervical  lymphadenopathy appriciated, No thyromegaly  7. Respiratory : Normal respiratory effort, good air movement bilaterally,clear to  auscultation bilaterally  8. Cardiovascular : RRR, no gallops, rubs or murmurs, no leg edema  9. Gastrointestinal:  Positive bowel sounds, abdomen soft, non-tender to palpation,no hepatosplenomegaly, no rigidity or guarding       10. Skin:  No cyanosis, normal texture and turgor, no rash, lesions or ulcers  11.Musculoskeletal:  Good muscle tone,  joints appear normal , no effusions,  normal range of motion    Data Review:    CBC  Recent Labs Lab 07/15/17 1312  WBC 11.9*  HGB 11.4*  HCT 33.9*  PLT 216  MCV 89.0  MCH 29.9  MCHC 33.6  RDW 14.4   ------------------------------------------------------------------------------------------------------------------  Chemistries   Recent Labs Lab 07/15/17 1312  NA 134*  K 5.2*  CL 100*  CO2 24  GLUCOSE 80  BUN 24*  CREATININE 1.30*  CALCIUM 10.2  AST 31  ALT 13*  ALKPHOS 80  BILITOT 1.5*   ------------------------------------------------------------------------------------------------------------------  ------------------------------------------------------------------------------------------------------------------ GFR: Estimated Creatinine Clearance: 28.5 mL/min (A) (by C-G formula based on SCr of 1.3 mg/dL (H)). Liver Function Tests:  Recent Labs Lab 07/15/17 1312  AST 31  ALT 13*  ALKPHOS 80  BILITOT 1.5*  PROT 7.5  ALBUMIN 4.1    Recent Labs Lab 07/15/17 1312  LIPASE 30   No results for input(s): AMMONIA in the last 168 hours. Coagulation Profile:  Recent Labs Lab 07/15/17 1406  INR  1.05    --------------------------------------------------------------------------------------------------------------- Urine analysis:    Component Value Date/Time   COLORURINE AMBER (A) 07/15/2017 1146   APPEARANCEUR HAZY (A) 07/15/2017 1146   LABSPEC 1.026 07/15/2017 1146   LABSPEC 1.005 07/06/2014 1434   PHURINE 5.0 07/15/2017 1146   GLUCOSEU NEGATIVE 07/15/2017 1146   GLUCOSEU Negative 07/06/2014 1434   HGBUR SMALL (A) 07/15/2017 1146   BILIRUBINUR NEGATIVE 07/15/2017 1146   BILIRUBINUR Negative 07/06/2014 1434   KETONESUR 20 (A) 07/15/2017 1146   PROTEINUR 30 (A) 07/15/2017 1146   UROBILINOGEN 0.2 01/11/2015 1536   UROBILINOGEN 0.2 07/06/2014 1434   NITRITE POSITIVE (A) 07/15/2017 1146   LEUKOCYTESUR MODERATE (A) 07/15/2017 1146   LEUKOCYTESUR Trace 07/06/2014 1434      Imaging Results:    No results found.  My personal review of EKG: Rhythm NSR   Assessment & Plan:    Active Problems:   Multinodular goiter   Metastasis to spinal column ([facility])   UTI (urinary tract infection)   1. UTI- patient has a normal UA, will start ceftriaxone per pharmacy consultation. Urine culture has been obtained. Follow urine culture results. 2. Lung cancer with metastases to spine- patient is supposed to get radiation treatment while in the hospital. Consider radiation oncology consult in am. Continue tramadol when necessary for pain 3. Recurrent falls- likely from pain, denies weakness. Will obtain PT OT evaluation in a.m. 4. Acute kidney injury/dehydration- patient' BUN/creatinine today is 24/1.30, previous BUN creatinine was 19.6/1.2 on 06/24/2017. We'll start gentle IV hydration. Follow BMP in the morning. 5. Hypertension-blood pressure stable, continue amlodipine, lisinopril 6. Hypothyroidism-continue Synthroid    DVT Prophylaxis-   Lovenox   AM Labs Ordered, also please review Full Orders  Family Communication: Admission, patients condition and plan of care including tests  being ordered have been discussed with the patient and her family member at bedside who indicate understanding and agree with the plan and Code Status.  Code Status:  Full code  Admission status: Observation  Time spent in minutes : 60 minutes   Serria Sloma S M.D on 07/15/2017 at 5:02 PM  Between 7am to 7pm - Pager - [phone]. After 7pm go to [email or web address] - password [facility]  [facility] - Office  [phone]

## 2017-07-15 NOTE — Progress Notes (Signed)
Consult request has been received. CSW attempting to follow up at present time.  Ricki Clack F. Brithany Whitworth, LCSW, LCAS, CSI Clinical Social Worker Ph: [phone]  

## 2017-07-16 ENCOUNTER — Other Ambulatory Visit: Payer: Self-pay | Admitting: Radiation Oncology

## 2017-07-16 ENCOUNTER — Ambulatory Visit
Admission: RE | Admit: 2017-07-16 | Discharge: 2017-07-16 | Disposition: A | Discharge status: Home / Self Care | Payer: Medicare Other | Source: Ambulatory Visit | Attending: Radiation Oncology | Admitting: Radiation Oncology

## 2017-07-16 DIAGNOSIS — N179 Acute kidney failure, unspecified: Secondary | ICD-10-CM

## 2017-07-16 DIAGNOSIS — C7951 Secondary malignant neoplasm of bone: Secondary | ICD-10-CM

## 2017-07-16 DIAGNOSIS — C3492 Malignant neoplasm of unspecified part of left bronchus or lung: Secondary | ICD-10-CM

## 2017-07-16 DIAGNOSIS — E039 Hypothyroidism, unspecified: Secondary | ICD-10-CM | POA: Diagnosis not present

## 2017-07-16 DIAGNOSIS — M5442 Lumbago with sciatica, left side: Secondary | ICD-10-CM | POA: Diagnosis not present

## 2017-07-16 DIAGNOSIS — I10 Essential (primary) hypertension: Secondary | ICD-10-CM | POA: Diagnosis not present

## 2017-07-16 DIAGNOSIS — E860 Dehydration: Secondary | ICD-10-CM | POA: Diagnosis not present

## 2017-07-16 DIAGNOSIS — C3491 Malignant neoplasm of unspecified part of right bronchus or lung: Secondary | ICD-10-CM

## 2017-07-16 DIAGNOSIS — E042 Nontoxic multinodular goiter: Secondary | ICD-10-CM | POA: Diagnosis not present

## 2017-07-16 DIAGNOSIS — N3000 Acute cystitis without hematuria: Secondary | ICD-10-CM

## 2017-07-16 DIAGNOSIS — M5441 Lumbago with sciatica, right side: Secondary | ICD-10-CM | POA: Diagnosis not present

## 2017-07-16 DIAGNOSIS — R296 Repeated falls: Secondary | ICD-10-CM

## 2017-07-16 LAB — COMPREHENSIVE METABOLIC PANEL
ALK PHOS: 70 U/L (ref 38–126)
ALT: 10 U/L — AB (ref 14–54)
AST: 13 U/L — ABNORMAL LOW (ref 15–41)
Albumin: 3.3 g/dL — ABNORMAL LOW (ref 3.5–5.0)
Anion gap: 8 (ref 5–15)
BUN: 17 mg/dL (ref 6–20)
CALCIUM: 9.3 mg/dL (ref 8.9–10.3)
CO2: 23 mmol/L (ref 22–32)
CREATININE: 0.92 mg/dL (ref 0.44–1.00)
Chloride: 107 mmol/L (ref 101–111)
GFR calc non Af Amer: 59 mL/min — ABNORMAL LOW (ref >60)
Glucose, Bld: 92 mg/dL (ref 65–99)
Potassium: 3.5 mmol/L (ref 3.5–5.1)
SODIUM: 138 mmol/L (ref 135–145)
TOTAL PROTEIN: 6.2 g/dL — AB (ref 6.5–8.1)
Total Bilirubin: 0.3 mg/dL (ref 0.3–1.2)

## 2017-07-16 LAB — URINE CULTURE

## 2017-07-16 LAB — CBC
HCT: 30.4 % — ABNORMAL LOW (ref 36.0–46.0)
Hemoglobin: 10.1 g/dL — ABNORMAL LOW (ref 12.0–15.0)
MCH: 29.5 pg (ref 26.0–34.0)
MCHC: 33.2 g/dL (ref 30.0–36.0)
MCV: 88.9 fL (ref 78.0–100.0)
Platelets: 183 10*3/uL (ref 150–400)
RBC: 3.42 MIL/uL — AB (ref 3.87–5.11)
RDW: 14.3 % (ref 11.5–15.5)
WBC: 8.8 10*3/uL (ref 4.0–10.5)

## 2017-07-16 MED ORDER — DEXAMETHASONE 2 MG PO TABS: 4.0000 mg | ORAL_TABLET | Freq: Two times a day (BID) | ORAL | 0 refills | Status: DC

## 2017-07-16 MED ORDER — DEXAMETHASONE 4 MG PO TABS
2.0000 mg | ORAL_TABLET | Freq: Two times a day (BID) | ORAL | Status: DC
  Administered 2017-07-16 – 2017-07-17 (×3): 2 mg via ORAL
  Filled 2017-07-16 (×3): qty 1

## 2017-07-16 MED ORDER — ENOXAPARIN SODIUM 40 MG/0.4ML ~~LOC~~ SOLN
40.0000 mg | SUBCUTANEOUS | Status: DC
  Administered 2017-07-16: 40 mg via SUBCUTANEOUS
  Filled 2017-07-16: qty 0.4

## 2017-07-16 NOTE — Care Management Obs Status (Signed)
[place] NOTIFICATION   Patient Details  Name: [name] MRN: [id number] Date of Birth: 26-Mar-1941   Medicare Observation Status Notification Given:  Yes    Lynnell Catalan, RN 07/16/2017, 3:04 PM

## 2017-07-16 NOTE — NC FL2 (Signed)
[place] LEVEL OF CARE SCREENING TOOL     IDENTIFICATION  Patient Name: [name] Birthdate: 1941/04/07 Sex: female Admission Date (Current Location): 07/15/2017  [facility] and [place] Number:  Herbalist and Address:  [facility],  [address] [place], [place]      Provider Number: [id number]  Attending Physician Name and Address:  Eugenie Filler, MD  Relative Name and Phone Number:       Current Level of Care: Hospital Recommended Level of Care: [place] Prior Approval Number:    Date Approved/Denied:   PASRR Number: [id number]   Discharge Plan: SNF    Current Diagnoses: Patient Active Problem List   Diagnosis Date Noted  . UTI (urinary tract infection) 07/15/2017  . 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 Muscle weakness (generalized) 06/06/2017  . Multiple falls 06/06/2017  . DDD (degenerative disc disease), lumbosacral 05/15/2017  . Incontinence of feces 04/23/2017  . Vaginal atrophy 04/23/2017  . Voiding dysfunction 04/23/2017  . Recurrent UTI 04/23/2017  . Dysuria 03/04/2017  . Hypertension 10/29/2016  . Encounter for antineoplastic immunotherapy 02/14/2016  . Chronic fatigue 02/14/2016  . Port catheter in place 02/09/2016  . Urine frequency 07/06/2014  . Diarrhea 05/24/2014  . Nausea and vomiting 05/24/2014  . Sepsis ([place]) 05/24/2014  . Hypothermia 05/24/2014  . Hypokalemia 05/24/2014  . Nausea vomiting and diarrhea 05/24/2014  . Proteinuria 04/28/2012  . Vitamin B12 deficiency 03/17/2012  . Unspecified deficiency anemia 12/02/2011  . 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 Multinodular goiter 11/29/2011  . Numbness 11/29/2011  . Acute pulmonary embolism ([place]) 11/04/2011  . Acute pulmonary embolism ([place]) 11/04/2011  . Hip pain 09/02/2011  . Foot pain 09/02/2011  . Bilateral lung cancer ([place]) 03/08/2011    Orientation RESPIRATION BLADDER Height & Weight      Self, Time, Situation, Place  Normal Incontinent Weight: 113 lb 5.1 oz (51.4 kg) Height:  5\' 5"  (165.1 cm)  BEHAVIORAL SYMPTOMS/MOOD NEUROLOGICAL BOWEL NUTRITION STATUS      Continent Diet (regular diet)  AMBULATORY STATUS COMMUNICATION OF NEEDS Skin   Extensive Assist Verbally Normal                       Personal Care Assistance Level of Assistance  Bathing, Feeding, Dressing Bathing Assistance: Limited assistance Feeding assistance: Independent Dressing Assistance: Independent     Functional Limitations Info  Sight, Hearing, Speech Sight Info: Adequate Hearing Info: Adequate Speech Info: Adequate    SPECIAL CARE FACTORS FREQUENCY  PT (By licensed PT), OT (By licensed OT)     PT Frequency: 5x OT Frequency: 5x            Contractures Contractures Info: Not present    Additional Factors Info  Code Status, Allergies Code Status Info: full code Allergies Info: Cymbalta Duloxetine Hcl, Fish Allergy, Amitriptyline, Codeine, Mirabegron, Solifenacin, Sulfa Antibiotics           Current Medications (07/16/2017):  This is the current hospital active medication list Current Facility-Administered Medications  Medication Dose Route Frequency Provider Last Rate Last Dose  . 0.9 %  sodium chloride infusion   Intravenous Continuous Oswald Hillock, MD 75 mL/hr at 07/16/17 0603    . acetaminophen (TYLENOL) tablet 650 mg  650 mg Oral Q6H PRN Oswald Hillock, MD       Or  . acetaminophen (TYLENOL) suppository 650 mg  650 mg Rectal Q6H PRN Oswald Hillock, MD      .  amLODipine (NORVASC) tablet 10 mg  10 mg Oral Daily Oswald Hillock, MD   10 mg at 07/16/17 1004  . aspirin EC tablet 81 mg  81 mg Oral Daily Oswald Hillock, MD   81 mg at 07/16/17 1004  . cefTRIAXone (ROCEPHIN) 1 g in dextrose 5 % 50 mL IVPB  1 g Intravenous Q24H Pham, Anh P, RPH      . clonazePAM (KLONOPIN) tablet 0.5 mg  0.5 mg Oral QHS Oswald Hillock, MD   0.5 mg at 07/15/17 2223  . dexamethasone (DECADRON) tablet 2  mg  2 mg Oral Q12H Hayden Pedro, PA-C   2 mg at 07/16/17 1004  . dorzolamide-timolol (COSOPT) 22.3-6.8 MG/ML ophthalmic solution 1 drop  1 drop Both Eyes BID Oswald Hillock, MD   1 drop at 07/15/17 2224  . enoxaparin (LOVENOX) injection 40 mg  40 mg Subcutaneous Q24H Eugenie Filler, MD      . levothyroxine (SYNTHROID, LEVOTHROID) tablet 88 mcg  88 mcg Oral QAC breakfast Oswald Hillock, MD   88 mcg at 07/16/17 0741  . lisinopril (PRINIVIL,ZESTRIL) tablet 40 mg  40 mg Oral Daily Oswald Hillock, MD   40 mg at 07/16/17 1004  . mirtazapine (REMERON) tablet 7.5 mg  7.5 mg Oral QHS Oswald Hillock, MD   7.5 mg at 07/15/17 2224  . ondansetron (ZOFRAN) tablet 4 mg  4 mg Oral Q6H PRN Oswald Hillock, MD       Or  . ondansetron (ZOFRAN) injection 4 mg  4 mg Intravenous Q6H PRN Oswald Hillock, MD      . traMADol Veatrice Bourbon) tablet 50 mg  50 mg Oral Q6H PRN Oswald Hillock, MD   50 mg at 07/16/17 0603     Discharge Medications: Please see discharge summary for a list of discharge medications.  Relevant Imaging Results:  Relevant Lab Results:   Additional Information [phone]. Daily radiation appts at [facility] until 07/28/17.   Nila Nephew, LCSW

## 2017-07-16 NOTE — Progress Notes (Signed)
PROGRESS NOTE    [name]  MQK:[id number] DOB: 01/23/1941 DOA: 07/15/2017 PCP: Harrison Mons, PA-C    Brief Narrative:  [name]  is a 76 y.o. female, With history of recurrent non-small cell lung cancer, status post right upper lobectomy in 2010, recurrence of disease and MRI of lumbar spine showed metastasis at L3 L5 S1 with tumor expansion of L3 vertebral body. Patient underwent evaluation for admission therapy yesterday, and plan was to start radiation treatment on the day of admission. Patient came to hospital with generalized weakness and falls at home. She called her PCP who told her to go to ED for further evaluation. In the ED patient found to have abnormal UA, though she has history of recurrent UTIs in the past. She denies dysuria. No chest pain or shortness of breath. No nausea vomiting or diarrhea. 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 Patient still with lower extremity weakness left greater than right. Continue Decadron. Patient is being seen by PT. Patient has been seen by radiation oncologist, Dr. Tammi Klippel who is planning on continuing palliative radiotherapy during the hospital stay. Patient will likely need skilled nursing facility on discharge.  #2 UTI Patient noted to have an abnormal urinalysis of moderate leukocytes positive nitrite 6-30 WBCs. Urine cultures with multiple species present. Patient with lung cancer with metastases receiving radiation. Will treat empirically. Continue IV Rocephin. Likely transition to oral Ceftin FLEX  tomorrow.  #3 recurrent Falls Likely secondary to #1. PT/OT.  #4 acute kidney injury/dehydration Likely secondary to a prerenal azotemia in the setting of ACE inhibitor and NSAIDs. Renal function improving with hydration. Follow.  #5 hypertension Continue Norvasc and lisinopril. To monitor renal function closely.  #6 multinodular goiter/hypothyroidism Continue home regimen of Synthroid. Outpatient follow-up.     DVT prophylaxis: Lovenox Code Status: Full Family Communication: Updated patient no family at bedside. Disposition Plan: SNF when medically stable.   Consultants:   Radiation/oncology Dr. Tammi Klippel 07/16/2017  Procedures:  None  Antimicrobials:   IV Rocephin 07/15/2017   Subjective: Patient states feeling a little bit better. Patient with complaints of left lower extremity weakness. Patient denies any chest pain. No shortness of breath.  Objective: Vitals:   07/15/17 1732 07/15/17 1819 07/15/17 2008 07/16/17 0412  BP: 140/81 127/66 115/68 131/80  Pulse: 99 99 92 92  Resp: (!) 26 (!) 22 (!) 21 20  Temp:  98.3 F (36.8 C) 98.5 F (36.9 C) 99.3 F (37.4 C)  TempSrc:  Oral Oral Oral  SpO2: 98% 100% 100% 95%  Weight:  51.4 kg (113 lb 5.1 oz)    Height:  5\' 5"  (1.651 m)      Intake/Output Summary (Last 24 hours) at 07/16/17 1228 Last data filed at 07/15/17 1845  Gross per 24 hour  Intake           566.25 ml  Output                0 ml  Net           566.25 ml   [employer]  07/15/17 1143 07/15/17 1819  Weight: 49.9 kg (110 lb) 51.4 kg (113 lb 5.1 oz)    Examination:  General exam: Appears calm and comfortable  Respiratory system: Clear to auscultation. Respiratory effort normal. Cardiovascular system: S1 & S2 heard, RRR. No JVD, murmurs, rubs, gallops or clicks. No pedal edema. Gastrointestinal system: Abdomen is nondistended, soft and nontender. No organomegaly or masses felt. Normal bowel sounds heard. Central nervous system: Alert and  oriented. No focal neurological deficits. Extremities: 5 out of 5 bilateral upper extremity strength. 5/5 right lower extremity strength. 3-4/5 left lower extremity strength. Skin: No rashes, lesions or ulcers Psychiatry: Judgement and insight appear normal. Mood & affect appropriate.     Data Reviewed: I have personally reviewed following labs and imaging studies  CBC:  Recent Labs Lab 07/15/17 1312 07/16/17 0357  WBC 11.9* 8.8  HGB 11.4* 10.1*  HCT 33.9* 30.4*  MCV 89.0 88.9  PLT 216 956   Basic Metabolic Panel:  Recent Labs Lab 07/15/17 1312 07/16/17 0357  NA 134* 138  K 5.2* 3.5  CL 100* 107  CO2 24 23  GLUCOSE 80 92  BUN 24* 17  CREATININE 1.30* 0.92  CALCIUM 10.2 9.3   GFR: Estimated Creatinine Clearance: 41.6 mL/min (by C-G formula based on SCr of 0.92 mg/dL). Liver Function Tests:  Recent Labs Lab 07/15/17 1312 07/16/17 0357  AST 31 13*  ALT 13* 10*  ALKPHOS 80 70  BILITOT 1.5* 0.3  PROT 7.5 6.2*  ALBUMIN 4.1 3.3*    Recent Labs Lab 07/15/17 1312  LIPASE 30   No results for input(s): AMMONIA in the last 168 hours. Coagulation Profile:  Recent Labs Lab 07/15/17 1406  INR 1.05   Cardiac Enzymes: No results for input(s): CKTOTAL, CKMB, CKMBINDEX, TROPONINI in the last 168 hours. BNP (last 3 results) No results for input(s): PROBNP in the last 8760 hours. HbA1C: No results for input(s): HGBA1C in the last 72 hours. CBG: No results for input(s): GLUCAP in the last 168 hours. Lipid Profile: No results for input(s): CHOL, HDL, LDLCALC, TRIG, CHOLHDL, LDLDIRECT in the last 72 hours. Thyroid Function Tests: No results for input(s): TSH, T4TOTAL, FREET4, T3FREE, THYROIDAB in the last 72 hours. Anemia Panel: No results for input(s): VITAMINB12, FOLATE, FERRITIN, TIBC, IRON, RETICCTPCT in the last 72 hours. Sepsis Labs: No results for input(s): PROCALCITON, LATICACIDVEN in the last 168 hours.  Recent Results (from the past 240 hour(s))    Urine culture     Status: Abnormal   Collection Time: 07/15/17 11:46 AM  Result Value Ref Range Status   Specimen Description URINE, RANDOM  Final   Special Requests NONE  Final   Culture MULTIPLE SPECIES PRESENT, SUGGEST RECOLLECTION (A)  Final   Report Status 07/16/2017 FINAL  Final         Radiology Studies: No results found.      Scheduled Meds: . amLODipine  10 mg Oral Daily  . aspirin EC  81 mg Oral Daily  . clonazePAM  0.5 mg Oral QHS  . dexamethasone  2 mg Oral Q12H  . dorzolamide-timolol  1 drop Both Eyes BID  . enoxaparin (LOVENOX) injection  40 mg Subcutaneous Q24H  . levothyroxine  88 mcg Oral QAC breakfast  . lisinopril  40 mg Oral Daily  . mirtazapine  7.5 mg Oral QHS   Continuous Infusions: . sodium chloride 75 mL/hr at 07/16/17 0603  . cefTRIAXone (ROCEPHIN)  IV       LOS: 0 days  Time spent: 71 minutes    THOMPSON,DANIEL, MD [facility] Pager [phone]  If 7PM-7AM, please contact night-coverage [email or web address] Password [email or web address] 07/16/2017, 12:28 PM

## 2017-07-16 NOTE — Progress Notes (Signed)
  Radiation Oncology         [phone] ________________________________  Name: [name] MRN: [id number]  Date: 07/15/2017  DOB: 1941/01/24  Chart Note:  I reviewed this patient's most recent findings and wanted to take a minute to document my impression.  Patient was seen and emergently started on palliative radiotherapy Monday to preserve spinal cord/nerve root function.  Appreciate hospital admission for support and likely skilled placement for lower extremity weakness.  Will continue palliative radiotherapy during hospital stay.  Given breast primary, survival may be longer than 6, so preservation of spinal cord function for bowel control and transfer strength is medically indicated.  ________________________________  Sheral Apley. Tammi Klippel, M.D.

## 2017-07-16 NOTE — Clinical Social Work Note (Signed)
Clinical Social Work Assessment  Patient Details  Name: [name] MRN: [id number] Date of Birth: [birth year]  Date of referral:  07/16/17               Reason for consult:  Facility Placement                Permission sought to share information with:  Family Supports Permission granted to share information::  Yes, Verbal Permission Granted  Name::     daughter Pharmacologist::     Relationship::     Contact Information:     Housing/Transportation Living arrangements for the past 2 months:  Single Family Home Source of Information:  Patient, Adult Children, Medical Team Patient Interpreter Needed:  None Criminal Activity/Legal Involvement Pertinent to Current Situation/Hospitalization:  No - Comment as needed Significant Relationships:  Adult Children, Neighbor Lives with:  Self Do you feel safe going back to the place where you live?  Yes Need for family participation in patient care:  No (Coment)  Care giving concerns:  Pt from home where she resides alone. Is fairly independent at baseline regarding ambulation- has a walker but daughter reports pt does not use it and rather "holds onto things in the house." Daughter describes pt as "hoarder, her house is packed with stuff and she can barely move around in it, she won't let anyone in." Pt does not discuss this. Daughter states she and the rest of the family have tried to intervene but have been unsuccessful. "Mom makes her own decisions, she gets a little bit confused from time to time but for the most part is very capable of making decisions and understands her choices." Daughter states family has been wanting pt to move out of home and into facility such as ALF or [place] but patient not agreeable. Pt has dog at home she wants to stay with (in boarding while pt in hospital).  Currently pt needing much assistance for ambulation and SNF recommended.  Social Worker assessment / plan:  CSW consulted for assistance with facility  placement. Pt has recommendation for SNF per therapy evaluation today and both she and pt's daughter are in agreement for referrals to [facility] rehab. Of note, pt receives [facility] at [facility], currently scheduled for daily radiation appts until 07/28/18 and infusion every 2 weeks, next 07/22/17. Would need a facility which could assist her with transportation to appointments. Aware these factors will greatly reduce the options she has or possibly be barriers to being able to admit to SNF. Pt has no facility preference at this time. Completed FL2 and obtained PASSR- made referrals to area SNFs.  Will follow up with bed offers.   Employment status:  Retired Nurse, adult PT Recommendations:  [place] / Referral to community resources:  [place]  Patient/Family's Response to care:  Both appreciative of care  Patient/Family's Understanding of and Emotional Response to Diagnosis, Current Treatment, and Prognosis:  Pt demonstrates adequate understanding of potential plan and barriers. Seems reasonable in expectations. Daughter demonstrates thorough understanding as well. Both positive in hopes for outcome. Pt demonstrates minimal emotional response but was pleasant and agreeable. Daughter seems emotionally well adjusted re situation.  Emotional Assessment Appearance:  Appears stated age Attitude/Demeanor/Rapport:   (pleasant) Affect (typically observed):  Accepting, Adaptable, Calm Orientation:  Oriented to Self, Oriented to Place, Oriented to  Time, Oriented to Situation Alcohol / Substance use:  Not Applicable Psych involvement (Current and /or in the  community):  No (Comment)  Discharge Needs  Concerns to be addressed:  Discharge Planning Concerns Readmission within the last 30 days:  No Current discharge risk:  Dependent with Mobility Barriers to Discharge:  No Barriers Identified   Nila Nephew, LCSW 07/16/2017, 2:27 PM   [phone]

## 2017-07-16 NOTE — Evaluation (Signed)
Physical Therapy Evaluation Patient Details Name: [name] MRN: [id number] DOB: 10/13/1941 Today's Date: 07/16/2017   History of Present Illness  76 y.o. female, With history of recurrent non-small cell lung cancer, status post right upper lobectomy in 2010, recurrence of disease and MRI of lumbar spine showed metastasis at L3 L5 S1 with tumor expansion of L3 vertebral body. Admission to start radiation treatment. Dx of UTI, AKI,.  Clinical Impression  Pt admitted with above diagnosis. Pt currently with functional limitations due to the deficits listed below (see PT Problem List). Min assist to pivot to bedside commode then back to bed. Ambulation deferred 2* fatigue with transfer. ST-SNF reommended.  Pt will benefit from skilled PT to increase their independence and safety with mobility to allow discharge to the venue listed below.       Follow Up Recommendations SNF    Equipment Recommendations  None recommended by PT    Recommendations for Other Services       Precautions / Restrictions Precautions Precautions: Fall Precaution Comments: several falls prior to admission Restrictions Weight Bearing Restrictions: No      Mobility  Bed Mobility Overal bed mobility: Needs Assistance Bed Mobility: Supine to Sit     Supine to sit: Min assist;HOB elevated     General bed mobility comments: increased time, min A to raise trunk, posterior lean initially in sitting  Transfers Overall transfer level: Needs assistance Equipment used: Rolling walker (2 wheeled) Transfers: Sit to/from [facility] Sit to Stand: Mod assist Stand pivot transfers: Min assist       General transfer comment: increased time, VCs for posture 2* flexed neck/trunk in standing with RW  Ambulation/Gait             General Gait Details: deferred 2* fatigue with transfers  Stairs            Wheelchair Mobility    Modified Rankin (Stroke Patients Only)        Balance Overall balance assessment: Needs assistance Sitting-balance support: Feet supported Sitting balance-Leahy Scale: Poor Sitting balance - Comments: poor initially with posterior lean then fair Postural control: Posterior lean   Standing balance-Leahy Scale: Poor Standing balance comment: requires BUE support                             Pertinent Vitals/Pain Pain Assessment: No/denies pain    Home Living Family/patient expects to be discharged to:: Private residence Living Arrangements: Alone   Type of Home: House Home Access: Stairs to enter Entrance Stairs-Rails: Right;Left;Can reach both Entrance Stairs-Number of Steps: 4 Home Layout: Multi-level Home Equipment: Walker - 2 wheels;Cane - single point;Shower seat      Prior Function Level of Independence: Independent with assistive device(s)         Comments: used RW in home     Hand Dominance   Dominant Hand: Right    Extremity/Trunk Assessment   Upper Extremity Assessment Upper Extremity Assessment: Defer to OT evaluation (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 which seems worse recently, OT eval ordered)    Lower Extremity Assessment Lower Extremity Assessment: Generalized weakness (-4/5 B knee extension)    Cervical / Trunk Assessment Cervical / Trunk Assessment: Kyphotic  Communication   Communication: No difficulties  Cognition Arousal/Alertness: Awake/alert Behavior During Therapy: WFL for tasks assessed/performed Overall Cognitive Status: Within Functional Limits for tasks assessed  General Comments      Exercises     Assessment/Plan    PT Assessment Patient needs continued PT services  PT Problem List Decreased strength;Decreased activity tolerance;Decreased balance;Decreased mobility       PT Treatment Interventions Gait training;Functional mobility training;Therapeutic exercise;DME  instruction;Therapeutic activities;Patient/family education;Balance training    PT Goals (Current goals can be found in the Care Plan section)  Acute Rehab PT Goals Patient Stated Goal: to get stronger, be able to play bingo PT Goal Formulation: With patient/family Time For Goal Achievement: 07/30/17 Potential to Achieve Goals: Good    Frequency Min 3X/week   Barriers to discharge Decreased caregiver support doesn't have 24* assist available    Co-evaluation               AM-PAC PT "6 Clicks" Daily Activity  Outcome Measure Difficulty turning over in bed (including adjusting bedclothes, sheets and blankets)?: A Little Difficulty moving from lying on back to sitting on the side of the bed? : Unable Difficulty sitting down on and standing up from a chair with arms (e.g., wheelchair, bedside commode, etc,.)?: Unable Help needed moving to and from a bed to chair (including a wheelchair)?: A Little Help needed walking in hospital room?: Total Help needed climbing 3-5 steps with a railing? : Total 6 Click Score: 10    End of Session Equipment Utilized During Treatment: Gait belt Activity Tolerance: Patient limited by fatigue Patient left: in bed;with call bell/phone within reach;with family/visitor present;with nursing/sitter in room (transporter arrived to take pt to radiation) Nurse Communication: Mobility status PT Visit Diagnosis: Unsteadiness on feet (R26.81);Repeated falls (R29.6);History of falling (Z91.81);Difficulty in walking, not elsewhere classified (R26.2);Muscle weakness (generalized) (M62.81)    Time: 1025-8527 PT Time Calculation (min) (ACUTE ONLY): 23 min   Charges:   PT Evaluation $PT Eval Low Complexity: 1 Low PT Treatments $Therapeutic Activity: 8-22 mins   PT G Codes:   PT G-Codes **NOT FOR INPATIENT CLASS** Functional Assessment Tool Used: AM-PAC 6 Clicks Basic Mobility Functional Limitation: Mobility: Walking and moving around Mobility: Walking  and Moving Around Current Status (P8242): At least 60 percent but less than 80 percent impaired, limited or restricted Mobility: Walking and Moving Around Goal Status [phone]): At least 20 percent but less than 40 percent impaired, limited or restricted     [name] 07/16/2017, 11:01 AM [phone]

## 2017-07-16 NOTE — Progress Notes (Signed)
Informed radiation therapist on L3 that the patient has been hospitalized. Explained she is in room 1327. Also, explained she is stable and should receive radiation therapy today as scheduled. Understanding verbalized.

## 2017-07-16 NOTE — Progress Notes (Signed)
After discussing with her primary medical oncologist, her immunotherapy will be on hold so I have ordered oral dexamethasone given her weakness and concerns for close proximity of her disease to her cord, though to date she does not have radiographic evidence of cord compression.    Carola Rhine, PAC

## 2017-07-17 ENCOUNTER — Ambulatory Visit
Admission: RE | Admit: 2017-07-17 | Discharge: 2017-07-17 | Disposition: A | Discharge status: Home / Self Care | Payer: Medicare Other | Source: Ambulatory Visit | Attending: Radiation Oncology | Admitting: Radiation Oncology

## 2017-07-17 ENCOUNTER — Telehealth: Payer: Self-pay | Admitting: Radiation Oncology

## 2017-07-17 DIAGNOSIS — C3491 Malignant neoplasm of unspecified part of right bronchus or lung: Secondary | ICD-10-CM | POA: Diagnosis not present

## 2017-07-17 DIAGNOSIS — M5442 Lumbago with sciatica, left side: Secondary | ICD-10-CM

## 2017-07-17 DIAGNOSIS — N3000 Acute cystitis without hematuria: Secondary | ICD-10-CM | POA: Diagnosis not present

## 2017-07-17 DIAGNOSIS — M5441 Lumbago with sciatica, right side: Secondary | ICD-10-CM

## 2017-07-17 DIAGNOSIS — C7951 Secondary malignant neoplasm of bone: Secondary | ICD-10-CM | POA: Diagnosis not present

## 2017-07-17 DIAGNOSIS — E039 Hypothyroidism, unspecified: Secondary | ICD-10-CM

## 2017-07-17 LAB — CBC WITH DIFFERENTIAL/PLATELET
BASOS ABS: 0 10*3/uL (ref 0.0–0.1)
BASOS PCT: 0 %
EOS ABS: 0 10*3/uL (ref 0.0–0.7)
EOS PCT: 0 %
HCT: 35 % — ABNORMAL LOW (ref 36.0–46.0)
Hemoglobin: 11.7 g/dL — ABNORMAL LOW (ref 12.0–15.0)
Lymphocytes Relative: 6 %
Lymphs Abs: 0.5 10*3/uL — ABNORMAL LOW (ref 0.7–4.0)
MCH: 29.7 pg (ref 26.0–34.0)
MCHC: 33.4 g/dL (ref 30.0–36.0)
MCV: 88.8 fL (ref 78.0–100.0)
MONO ABS: 0.1 10*3/uL (ref 0.1–1.0)
Monocytes Relative: 2 %
Neutro Abs: 7.8 10*3/uL — ABNORMAL HIGH (ref 1.7–7.7)
Neutrophils Relative %: 92 %
Platelets: 215 10*3/uL (ref 150–400)
RBC: 3.94 MIL/uL (ref 3.87–5.11)
RDW: 14.3 % (ref 11.5–15.5)
WBC: 8.5 10*3/uL (ref 4.0–10.5)

## 2017-07-17 LAB — BASIC METABOLIC PANEL
ANION GAP: 9 (ref 5–15)
BUN: 15 mg/dL (ref 6–20)
CALCIUM: 9.9 mg/dL (ref 8.9–10.3)
CO2: 23 mmol/L (ref 22–32)
Chloride: 107 mmol/L (ref 101–111)
Creatinine, Ser: 1.03 mg/dL — ABNORMAL HIGH (ref 0.44–1.00)
GFR calc Af Amer: 59 mL/min — ABNORMAL LOW (ref >60)
GFR, EST NON AFRICAN AMERICAN: 51 mL/min — AB (ref >60)
GLUCOSE: 202 mg/dL — AB (ref 65–99)
POTASSIUM: 4.1 mmol/L (ref 3.5–5.1)
SODIUM: 139 mmol/L (ref 135–145)

## 2017-07-17 MED ORDER — TRAMADOL HCL 50 MG PO TABS: 50.0000 mg | ORAL_TABLET | Freq: Four times a day (QID) | ORAL | 0 refills | Status: DC | PRN

## 2017-07-17 MED ORDER — CEPHALEXIN 500 MG PO CAPS
500.0000 mg | ORAL_CAPSULE | Freq: Two times a day (BID) | ORAL | Status: DC
  Administered 2017-07-17: 500 mg via ORAL
  Filled 2017-07-17: qty 1

## 2017-07-17 MED ORDER — POTASSIUM CHLORIDE CRYS ER 20 MEQ PO TBCR
20.0000 meq | EXTENDED_RELEASE_TABLET | Freq: Once | ORAL | Status: AC
  Administered 2017-07-17: 20 meq via ORAL
  Filled 2017-07-17: qty 1

## 2017-07-17 MED ORDER — ENSURE ENLIVE PO LIQD
237.0000 mL | Freq: Two times a day (BID) | ORAL | Status: DC
  Administered 2017-07-17: 237 mL via ORAL

## 2017-07-17 MED ORDER — ENSURE ENLIVE PO LIQD: 237.0000 mL | Freq: Two times a day (BID) | ORAL | 12 refills | Status: AC

## 2017-07-17 MED ORDER — POTASSIUM CHLORIDE 20 MEQ PO PACK: 20.0000 meq | PACK | Freq: Once | ORAL | Status: DC

## 2017-07-17 MED ORDER — CEPHALEXIN 500 MG PO CAPS: 500.0000 mg | ORAL_CAPSULE | Freq: Two times a day (BID) | ORAL | 0 refills | Status: AC

## 2017-07-17 MED ORDER — CLONAZEPAM 0.5 MG PO TABS: 0.5000 mg | ORAL_TABLET | Freq: Every day | ORAL | 0 refills | Status: DC

## 2017-07-17 MED ORDER — DEXAMETHASONE 2 MG PO TABS: 2.0000 mg | ORAL_TABLET | Freq: Two times a day (BID) | ORAL | 0 refills | Status: DC

## 2017-07-17 NOTE — Progress Notes (Signed)
CSW following for disposition. Plan for SNF for ST rehab at DC. Provided bed offers to pt and daughter. [facility]. CSW confirmed facility able to assist pt with transportation to radiation appointments.  Selected facility in the [place]. Will follow and assist with transition to [facility] at DC.   Sharren Bridge, MSW, LCSW Clinical Social Work 07/17/2017 [phone]

## 2017-07-17 NOTE — Progress Notes (Signed)
Initial Nutrition Assessment  DOCUMENTATION CODES:   Severe malnutrition in context of chronic illness  INTERVENTION:   -Provide Ensure Enlive po BID, each supplement provides 350 kcal and 20 grams of protein -Encourage PO intake -RD will continue to monitor  NUTRITION DIAGNOSIS:   Malnutrition(severe) related to cancer and cancer related treatments, chronic illness as evidenced by percent weight loss, energy intake < or equal to 75% for > or equal to 1 month, moderate depletion of body fat, moderate depletions of muscle mass.  GOAL:   Patient will meet greater than or equal to 90% of their needs  MONITOR:   PO intake, Supplement acceptance, Labs, Weight trends, I & O's  REASON FOR ASSESSMENT:   Malnutrition Screening Tool    ASSESSMENT:    76 y.o. female, With history of recurrent non-small cell lung cancer, status post right upper lobectomy in 2010, recurrence of disease and MRI of lumbar spine showed metastasis at L3 L5 S1 with tumor expansion of L3 vertebral body. Patient underwent evaluation for admission therapy yesterday, and plan was to start radiation treatment on the day of admission.  Patient reports poor appetite since her first fall on 8/26. Pt states she has not eaten much at all since then. Since admission on 10/2, she has been eating better ~30-40% of meals. Pt states she likes the food here and ate most of her breakfast this morning of pancakes with bacon and coffee. Pt likes vanilla Ensure supplements at home. Will place order. Denies swallowing or chewing issues but reports some instances of food feeling like it's stuck.  Pt states she is to discharge to SNF today.  Pt reports UBW is 130-140 lb. Pt has lost 10 lb since 8/28 (8% wt loss x 1 month, significant for time frame). Nutrition-Focused physical exam completed. Findings are moderate fat depletion, moderate muscle depletion, and no edema.   Medications: Decadron tablet every 12 hours, Remeron tablet  daily Labs reviewed: GFR: 51  Diet Order:  Diet regular Room service appropriate? Yes; Fluid consistency: Thin  Skin:  Reviewed, no issues  Last BM:  10/3  Height:   Ht Readings from Last 1 Encounters:  07/15/17 5\' 5"  (1.651 m)    Weight:   Wt Readings from Last 1 Encounters:  07/15/17 113 lb 5.1 oz (51.4 kg)    Ideal Body Weight:  56.8 kg  BMI:  Body mass index is 18.86 kg/m.  Estimated Nutritional Needs:   Kcal:  1500-1700  Protein:  70-80g  Fluid:  1.7L/day  EDUCATION NEEDS:   No education needs identified at this time  Clayton Bibles, MS, RD, LDN Pager: [phone] After Hours Pager: [phone]

## 2017-07-17 NOTE — Progress Notes (Signed)
Contacted [facility] facility to give report on patient for transfer. Gave report to [place] at [phone]

## 2017-07-17 NOTE — Evaluation (Addendum)
Occupational Therapy Evaluation Patient Details Name: [name] MRN: [id number] DOB: 08-13-1941 Today's Date: 07/17/2017    History of Present Illness 76 y.o. female, With history of recurrent non-small cell lung cancer, status post right upper lobectomy in 2010, recurrence of disease and MRI of lumbar spine showed metastasis at L3 L5 S1 with tumor expansion of L3 vertebral body. Admission to start radiation treatment. Dx of UTI, AKI,.   Clinical Impression   Pt was admitted for the above. She lives alone at baseline and was mod I but has had recent falls. She will benefit from continued OT to increase safety and independence with adls.  Goals in acute are for min guard A. She needs mostly min A at this time    Follow Up Recommendations  SNF    Equipment Recommendations  3 in 1 bedside commode    Recommendations for Other Services       Precautions / Restrictions Precautions Precautions: Fall Precaution Comments: several falls prior to admission Restrictions Weight Bearing Restrictions: No      Mobility Bed Mobility     Rolling: Min guard Sidelying to sit: Min assist Supine to sit: Min assist     General bed mobility comments: educated on sequence  Transfers   Equipment used: 1 person hand held assist   Sit to Stand: Min assist Stand pivot transfers: Min assist       General transfer comment: pt had urgency; provided hand held assistance for SPT to 3:1    Balance                                           ADL either performed or assessed with clinical judgement   ADL Overall ADL's : Needs assistance/impaired Eating/Feeding: Independent   Grooming: Wash/dry hands;Set up;Sitting   Upper Body Bathing: Set up;Sitting   Lower Body Bathing: Minimal assistance;Sit to/from stand   Upper Body Dressing : Minimal assistance;Sitting   Lower Body Dressing: Moderate assistance;Sit to/from stand   Toilet Transfer: Minimal  assistance;Stand-pivot;BSC   Toileting- Clothing Manipulation and Hygiene: Minimal assistance;Sit to/from stand         General ADL Comments: Began evaluation just prior to transporter arriving to take her to XRT.  Continued evaluation when pt returned, but she was tired.  Performed SPT to commode and educated on log rolling for bed mobility to decrease stress on back.  Pt reports bil UE peripheral neuropathy, but she is able to manage fasteners with extra time. She is familiar with button hook, but doesn't feel it will benefit her.       Vision         Perception     Praxis      Pertinent Vitals/Pain Pain Assessment: No/denies pain     Hand Dominance     Extremity/Trunk Assessment Upper Extremity Assessment Upper Extremity Assessment: Generalized weakness (RUE 4/5; LUE 4-/5)           Communication Communication Communication: No difficulties   Cognition Arousal/Alertness: Awake/alert Behavior During Therapy: WFL for tasks assessed/performed Overall Cognitive Status: Within Functional Limits for tasks assessed                                     General Comments       Exercises     Shoulder  Instructions      Home Living Family/patient expects to be discharged to:: Skilled nursing facility Living Arrangements: Alone                                      Prior Functioning/Environment Level of Independence: Independent with assistive device(s)        Comments: used RW in home        OT Problem List: Decreased strength;Decreased activity tolerance;Impaired balance (sitting and/or standing);Decreased knowledge of use of DME or AE;Decreased knowledge of precautions      OT Treatment/Interventions: Self-care/ADL training;DME and/or AE instruction;Patient/family education;Balance training;Therapeutic activities    OT Goals(Current goals can be found in the care plan section) Acute Rehab OT Goals Patient Stated Goal: to get  stronger, be able to play bingo OT Goal Formulation: With patient/family Time For Goal Achievement: 07/24/17 Potential to Achieve Goals: Good ADL Goals Pt Will Perform Grooming: with min guard assist;standing Pt Will Perform Lower Body Bathing: with min guard assist;sit to/from stand Pt Will Perform Lower Body Dressing: with min guard assist;sit to/from stand;with adaptive equipment Pt Will Transfer to Toilet: with min guard assist;bedside commode;ambulating;stand pivot transfer Pt Will Perform Toileting - Clothing Manipulation and hygiene: with min guard assist;sit to/from stand Additional ADL Goal #1: pt will perform bed mobility at min guard level in preparation for adls  OT Frequency: Min 2X/week   Barriers to D/C:            Co-evaluation              AM-PAC PT "6 Clicks" Daily Activity     Outcome Measure Help from another person eating meals?: None Help from another person taking care of personal grooming?: A Little Help from another person toileting, which includes using toliet, bedpan, or urinal?: A Little Help from another person bathing (including washing, rinsing, drying)?: A Little Help from another person to put on and taking off regular upper body clothing?: A Little Help from another person to put on and taking off regular lower body clothing?: A Lot 6 Click Score: 18   End of Session    Activity Tolerance: Patient limited by fatigue Patient left: in bed;with call bell/phone within reach;with bed alarm set  OT Visit Diagnosis: Unsteadiness on feet (R26.81);Muscle weakness (generalized) (M62.81)                Time: 1031-5945 OT Time Calculation (min): 22 min Charges:  OT General Charges $OT Visit: 1 Visit OT Evaluation $OT Eval Low Complexity: 1 Low G-Codes: OT G-codes **NOT FOR INPATIENT CLASS** Functional Assessment Tool Used: Clinical judgement Functional Limitation: Self care Self Care Current Status (O5929): At least 40 percent but less than 60  percent impaired, limited or restricted Self Care Goal Status (W4462): At least 1 percent but less than 20 percent impaired, limited or restricted   [name], OTR/L [phone] 07/17/2017  Jaqualin Serpa 07/17/2017, 3:34 PM

## 2017-07-17 NOTE — Telephone Encounter (Signed)
Fax copy of treatment schedule to [facility]. Confirmation fax of delivery obtained.

## 2017-07-17 NOTE — Discharge Summary (Signed)
Physician Discharge Summary  [name] NID:[id number] DOB: Dec 03, 1940 DOA: 07/15/2017  PCP: Harrison Mons, PA-C  Admit date: 07/15/2017 Discharge date: 07/17/2017  Time spent: 60 minutes  Recommendations for Outpatient Follow-up:  1. Follow-up with M.D. at [facility] skilled nursing facility. Patient will need a basic metabolic profile done in 1 week to follow-up on electrolytes and renal function. Patient's blood pressure need to be reassessed and medications titrated as needed. 2. Patient will be following up with radiation/ oncologist Dr. Tammi Klippel for outpatient radiation treatments.   Discharge Diagnoses:  Principal Problem:   Metastasis to spinal column [facility]) Active Problems:   Multinodular goiter   Vitamin B12 deficiency   Hypertension   Multiple falls   UTI (urinary tract infection)   Dehydration   AKI (acute kidney injury) ([place])   Acute bilateral low back pain with bilateral sciatica   Hypothyroidism   Discharge Condition: Stable and improved  Diet recommendation: Regular  Filed Weights   07/15/17 1143 07/15/17 1819  Weight: 49.9 kg (110 lb) 51.4 kg (113 lb 5.1 oz)    History of present illness:  Per Dr. Peter Minium  is a 76 y.o. female, With history of recurrent non-small cell lung cancer, status post right upper lobectomy in 2010, recurrence of disease and MRI of lumbar spine showed metastasis at L3 L5 S1 with tumor expansion of L3 vertebral body. Patient underwent evaluation for admission therapy yesterday, and plan was to start radiation treatment on day of admission. Patient came to hospital with generalized weakness and falls at home. She called her PCP who told her to go to ED for further evaluation. In the ED patient found to have abnormal UA, though she has history of recurrent UTIs in the past. She denied dysuria. No chest pain or shortness of breath. No nausea vomiting or diarrhea. 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 Patient still with lower extremity weakness left greater than right. Patient maintained on Decadron. Patient was seen by PT. Patient has been seen by radiation oncologist, Dr. Tammi Klippel and patient underwent some palliative radiotherapy during this hospitalization. Patient be discharged to skilled nursing facility and will be going back and forth between skilled nursing facility and [facility] for her radiation treatments.   #2 UTI Patient noted to have an abnormal urinalysis of moderate leukocytes positive nitrite 6-30 WBCs. Urine cultures with multiple species present. Patient with lung cancer with metastases receiving radiation. Patient was placed empirically on IV Rocephin and transition to oral Keflex. Patient be discharged on 6 more days of oral Keflex to complete a course of antibiotic treatment.   #3 recurrent Falls Likely secondary to #1. PT/OT. Patient will be discharged to skilled nursing facility.  #4 acute kidney injury/dehydration Likely secondary to a prerenal azotemia in the setting of ACE inhibitor and NSAIDs. Renal function improved with hydration. Follow.  #5 hypertension Patient was maintained on home regimen of Norvasc and lisinopril.   #6 multinodular goiter/hypothyroidism Continued on home regimen of Synthroid.     Procedures:  None  Consultations:  Radiation/oncology Dr. Tammi Klippel 07/16/2017   Discharge Exam: Vitals:   07/16/17 2100 07/17/17 0439  BP: (!) 118/58 (!) 150/63  Pulse: 93 89  Resp: 18 18  Temp: 99.4 F (37.4 C) 98 F (36.7 C)  SpO2: 99%  98%    General: NAD Cardiovascular: RRR Respiratory: CTAB  Discharge Instructions   Discharge Instructions    Diet general    Complete by:  As directed    Increase  activity slowly    Complete by:  As directed      Current Discharge Medication List    START taking these medications   Details  dexamethasone (DECADRON) 2 MG tablet Take 1 tablet (2 mg total) by mouth 2 (two) times daily. Taper instructions per rad onc Qty: 20 tablet, Refills: 0    feeding supplement, ENSURE ENLIVE, (ENSURE ENLIVE) LIQD Take 237 mLs by mouth 2 (two) times daily between meals. Qty: 237 mL, Refills: 12      CONTINUE these medications which have CHANGED   Details  cephALEXin (KEFLEX) 500 MG capsule Take 1 capsule (500 mg total) by mouth every 12 (twelve) hours. Take for 6 days then stop. Qty: 12 capsule, Refills: 0    clonazePAM (KLONOPIN) 0.5 MG tablet Take 1 tablet (0.5 mg total) by mouth at bedtime. Per [name], NP Qty: 15 tablet, Refills: 0    traMADol (ULTRAM) 50 MG tablet Take 1 tablet (50 mg total) by mouth every 6 (six) hours as needed for severe pain. Qty: 20 tablet, Refills: 0   Associated Diagnoses: Acute bilateral low back pain with bilateral sciatica      CONTINUE these medications which have NOT CHANGED   Details  acetaminophen (TYLENOL) 325 MG tablet Take 650 mg by mouth every 6 (six) hours as needed for moderate pain.    Associated Diagnoses: Bilateral lung cancer ([place]); Pain of both hip joints; Encounter for antineoplastic immunotherapy; Chronic fatigue    amLODipine (NORVASC) 5 MG tablet Take 10 mg by mouth daily. Per [name], NP    aspirin 81 MG tablet Take 81 mg by mouth daily.   Associated Diagnoses: Malignant neoplasm of lung, unspecified laterality, unspecified part of lung (HCC)    cyanocobalamin (,VITAMIN B-12,) 1000 MCG/ML injection Inject 1,000 mcg into the muscle every 30 (thirty) days.    dorzolamide-timolol (COSOPT) 22.3-6.8 MG/ML ophthalmic solution Place 2 drops into both eyes 2 (two) times daily. Per [name], NP    fluticasone (FLONASE) 50 MCG/ACT nasal spray Place 1 spray into both nostrils daily as needed  for allergies or rhinitis.    levothyroxine (SYNTHROID, LEVOTHROID) 88 MCG tablet Take 1 tablet (88 mcg total) by mouth daily before breakfast. Qty: 30 tablet, Refills: 2    lidocaine-prilocaine (EMLA) cream Apply 1 application topically as needed (prior to port access). Qty: 30 g, Refills: 0   Associated Diagnoses: Port catheter in place    lisinopril (PRINIVIL,ZESTRIL) 40 MG tablet Take 1 tablet (40 mg total) by mouth daily. Qty: 90 tablet, Refills: 3    PREMARIN vaginal cream INSERT BLUEBERRY SIZE AMOUNT OF ESTROGEN CREAM INTO VAGINA TWICE WEEKLY. Refills: 1    meloxicam (MOBIC) 7.5 MG tablet TAKE 1 TABLET BY MOUTH EVERY DAY Qty: 30 tablet, Refills: 0   Associated Diagnoses: DDD (degenerative disc disease), lumbosacral    mirtazapine (REMERON) 7.5 MG tablet Take 1 tablet (7.5 mg total) by mouth at bedtime. Qty: 30 tablet, Refills: 2   Associated Diagnoses: Unintentional weight loss; Decreased appetite; Dysphoric mood       Allergies  Allergen Reactions  . Cymbalta [Duloxetine Hcl] Other (See Comments)    Involuntary movements " turned me completely around"  . Fish Allergy Diarrhea  . Amitriptyline     Initial reaction about 25 years ago.  Marland Kitchen  Codeine Nausea And Vomiting    vomiting  . Mirabegron Hypertension  . Solifenacin Other (See Comments)    Due to glaucoma  . Sulfa Antibiotics Nausea And Vomiting    Contact information for follow-up providers    [facility] Radiation Oncology Follow up.   Specialty:  Radiation Oncology Why:  f/u as scheduled for radiation treatments. Contact information: [place] [phone] [place] [phone]           Contact information for after-discharge care    Destination    [facility] Follow up.   Specialty:  [place] information: [address] [place] [phone]                    The results of significant diagnostics from this hospitalization (including imaging, microbiology, ancillary and laboratory) are listed below for reference.    Significant Diagnostic Studies: Mr Lumbar Spine Wo Contrast  Result Date: 07/10/2017 CLINICAL DATA:  Back pain. Lower extremity weakness. History of lung cancer. EXAM: MRI LUMBAR SPINE WITHOUT CONTRAST TECHNIQUE: Multiplanar, multisequence MR imaging of the lumbar spine was performed. No intravenous contrast was administered. Patient did not wish to proceed with contrast and enhanced imaging at this time. COMPARISON:  Lumbar spine radiographs April 28, 2017 and PET CT April 18, 2011 FINDINGS: Mild motion degraded examination. SEGMENTATION: For the purposes of this report, the last well-formed intervertebral disc will be reported as L5-S1. ALIGNMENT: Maintained lumbar lordosis. No malalignment. Mild mid lumbar levoscoliosis as seen on prior radiographs. VERTEBRAE:Low T1, intermediate T2 expansile signal replaces the L3 vertebral body extending through the pedicles into the transverse process and superior articular facets, sparing of the spinous process. Mild RIGHT L3 wedging, relatively stable from 2012. 8 mm similar signal lesion RIGHT S1 and 4 mm lesion of LEFT L5 transverse process vertebral body. Severe L2-3 through L4-5 disc height loss with moderate L3-4 and L4-5, mild L2-3 chronic discogenic endplate changes, trace acute component L2-3. Remaining disc morphology is preserved. Small T11-12 disc protrusion. CONUS MEDULLARIS: Conus medullaris terminates at L1-2 and demonstrates normal morphology and signal characteristics. Tumor centrally displaces the cauda equina at L3. Cauda equina is otherwise unremarkable. PARASPINAL AND SOFT TISSUES: Bright interstitial paraspinal muscle signal L3-4, L4-5. Mild symmetric paraspinal muscle atrophy. DISC LEVELS: L1-2: No disc bulge, canal stenosis nor neural foraminal narrowing. L2-3: 5 mm broad-based disc  bulge. Superimposed ventral epidural and lateral recess tumor measuring to 10 mm in AP dimension resulting in severe canal stenosis and effacement of the thecal sac. Lateral recess effacement with L3 nerve impingement. Mild bilateral neural foraminal narrowing. L3-4: Small LEFT central disc protrusion and annular fissure. Mild facet arthropathy and ligamentum flavum redundancy. Mild canal stenosis and effaced LEFT lateral recess likely affecting the traversing LEFT L4 nerve. Epidural tumor extending into the superior aspect of the neural foramen resulting in bilateral severe neural foraminal narrowing. L4-5: 7 mm broad-based disc bulge. Large LEFT subarticular to extraforaminal disc extrusion with annular fissure. Mild RIGHT, mild to moderate LEFT facet arthropathy and ligamentum flavum redundancy with trace RIGHT facet effusion which is likely reactive. Partially effaced lateral recesses may affect the traversing L5 nerves. No canal stenosis. Severe LEFT neural foraminal narrowing and displaced LEFT exited L4 nerve. L5-S1: No disc bulge. Moderate to severe facet arthropathy without canal stenosis. LEFT transitional anatomy with pseudoarthrosis. Mild RIGHT neural foraminal narrowing. IMPRESSION: 1. L3, L5 and S1 metastasis with tumoral expansion of  L3 vertebral body. Mild chronic RIGHT L3 wedging. 2. L3 tumor expansion to epidural space resulting in severe canal stenosis at L3 and, severe neural foraminal narrowing L3-4. L3 nerve impingement. 3. Large LEFT L4-5 disc extrusion resulting in severe LEFT L4-5 neural foraminal narrowing and exited LEFT L4 nerve impingement. 4. Degenerative change of the lumbar spine.  No malalignment. Acute findings discussed with and reconfirmed by PA.CHELLE JEFFERY on 07/10/2017 at 2:00 pm. Electronically Signed   By: Elon Alas M.D.   On: 07/10/2017 14:30    Microbiology: Recent Results (from the past 240 hour(s))  Urine culture     Status: Abnormal   Collection Time:  07/15/17 11:46 AM  Result Value Ref Range Status   Specimen Description URINE, RANDOM  Final   Special Requests NONE  Final   Culture MULTIPLE SPECIES PRESENT, SUGGEST RECOLLECTION (A)  Final   Report Status 07/16/2017 FINAL  Final  Blood culture (routine x 2)     Status: None (Preliminary result)   Collection Time: 07/15/17  1:12 PM  Result Value Ref Range Status   Specimen Description BLOOD RIGHT ANTECUBITAL  Final   Special Requests   Final    BOTTLES DRAWN AEROBIC AND ANAEROBIC Blood Culture adequate volume   Culture   Final    NO GROWTH < 24 HOURS Performed at [facility], [place] [address], [place], [place] [address]    Report Status PENDING  Incomplete  Blood culture (routine x 2)     Status: None (Preliminary result)   Collection Time: 07/15/17  4:29 PM  Result Value Ref Range Status   Specimen Description BLOOD LEFT ANTECUBITAL  Final   Special Requests   Final    BOTTLES DRAWN AEROBIC AND ANAEROBIC Blood Culture adequate volume   Culture   Final    NO GROWTH < 24 HOURS Performed at [facility], [place] [address], [place],  [address]    Report Status PENDING  Incomplete     Labs: Basic Metabolic Panel:  Recent Labs Lab 07/15/17 1312 07/16/17 0357 07/17/17 0901  NA 134* 138 139  K 5.2* 3.5 4.1  CL 100* 107 107  CO2 24 23 23   GLUCOSE 80 92 202*  BUN 24* 17 15  CREATININE 1.30* 0.92 1.03*  CALCIUM 10.2 9.3 9.9   Liver Function Tests:  Recent Labs Lab 07/15/17 1312 07/16/17 0357  AST 31 13*  ALT 13* 10*  ALKPHOS 80 70  BILITOT 1.5* 0.3  PROT 7.5 6.2*  ALBUMIN 4.1 3.3*    Recent Labs Lab 07/15/17 1312  LIPASE 30   No results for input(s): AMMONIA in the last 168 hours. CBC:  Recent Labs Lab 07/15/17 1312 07/16/17 0357 07/17/17 0901  WBC 11.9* 8.8 8.5  NEUTROABS  --   --  7.8*  HGB 11.4* 10.1* 11.7*  HCT 33.9* 30.4* 35.0*  MCV 89.0 88.9 88.8  PLT 216 183 215   Cardiac Enzymes: No results for input(s): CKTOTAL, CKMB,  CKMBINDEX, TROPONINI in the last 168 hours. BNP: BNP (last 3 results) No results for input(s): BNP in the last 8760 hours.  ProBNP (last 3 results) No results for input(s): PROBNP in the last 8760 hours.  CBG: No results for input(s): GLUCAP in the last 168 hours.     SignedIrine Seal MD.  [facility] 07/17/2017, 3:14 PM

## 2017-07-17 NOTE — Clinical Social Work Placement (Addendum)
Pt discharging today- will admit to [facility] for ST rehab. Report [id number] Pt's daughter aware and agreeable to plan this morning. CSW left voicemail this afternoon to update her re: transport time.  Will transport via [facility]-completed medical necessity form and will arrange transportation. All information provided to facility via the [place]  NOTE  Date:  07/17/2017  Patient Details  Name: [name] MRN: [id number] Date of Birth: 03-18-1941  Clinical Social Work is seeking post-discharge placement for this patient at the [place] level of care (*CSW will initial, date and re-position this form in  chart as items are completed):  Yes   Patient/family provided with [place] Work Department's list of facilities offering this level of care within the geographic area requested by the patient (or if unable, by the patient's family).  Yes   Patient/family informed of their freedom to choose among providers that offer the needed level of care, that participate in Medicare, Medicaid or managed care program needed by the patient, have an available bed and are willing to accept the patient.  Yes   Patient/family informed of [facility]'s ownership interest in [facility] and [facility], as well as of the fact that they are under no obligation to receive care at these facilities.  PASRR submitted to EDS on 07/16/17     PASRR number received on 07/16/17     Existing PASRR number confirmed on       FL2 transmitted to all facilities in geographic area requested by pt/family on 07/16/17     FL2 transmitted to all facilities within larger geographic area on       Patient informed that his/her managed care company has contracts with or will negotiate with certain facilities, including the following:        Yes   Patient/family informed of bed offers received.  Patient chooses bed at [facility]     Physician recommends and patient chooses bed at [facility]    Patient to be transferred to [facility] on 07/17/17.  Patient to be transferred to facility by [facility]     Patient family notified on 07/17/17 of transfer.  Name of family member notified:  daughter [facility]     PHYSICIAN       Additional Comment:    _______________________________________________ Nila Nephew, LCSW 07/17/2017, 3:19 PM

## 2017-07-18 ENCOUNTER — Ambulatory Visit
Admission: RE | Admit: 2017-07-18 | Discharge: 2017-07-18 | Disposition: A | Discharge status: Home / Self Care | Payer: Medicare Other | Source: Ambulatory Visit | Attending: Radiation Oncology | Admitting: Radiation Oncology

## 2017-07-18 DIAGNOSIS — Z510 Encounter for antineoplastic radiation therapy: Secondary | ICD-10-CM | POA: Diagnosis not present

## 2017-07-19 ENCOUNTER — Telehealth: Payer: Self-pay | Admitting: Physician Assistant

## 2017-07-19 MED ORDER — LEVOTHYROXINE SODIUM 88 MCG PO TABS
88.0000 ug | ORAL_TABLET | Freq: Every day | ORAL | 3 refills | Status: DC
Start: 2017-07-19 — End: 2017-09-18

## 2017-07-19 NOTE — Telephone Encounter (Signed)
Fax from pharmacy requesting refill of levothyroxine  Meds ordered this encounter  Medications  . levothyroxine (SYNTHROID, LEVOTHROID) 88 MCG tablet    Sig: Take 1 tablet (88 mcg total) by mouth daily before breakfast.    Dispense:  90 tablet    Refill:  3    This medication was ordered 06/18/2017, #30 with 2 refills. I received a fax this week, requesting a refill. Place this on file if it is a duplicate.    Order Specific Question:   Supervising Provider    Answer:   Shawnee Knapp [phone]

## 2017-07-20 LAB — CULTURE, BLOOD (ROUTINE X 2)
CULTURE: NO GROWTH
Culture: NO GROWTH
Special Requests: ADEQUATE
Special Requests: ADEQUATE

## 2017-07-21 ENCOUNTER — Ambulatory Visit
Admission: RE | Admit: 2017-07-21 | Discharge: 2017-07-21 | Disposition: A | Discharge status: Home / Self Care | Payer: Medicare Other | Source: Ambulatory Visit | Attending: Radiation Oncology | Admitting: Radiation Oncology

## 2017-07-21 DIAGNOSIS — Z510 Encounter for antineoplastic radiation therapy: Secondary | ICD-10-CM | POA: Diagnosis not present

## 2017-07-22 ENCOUNTER — Ambulatory Visit
Admission: RE | Admit: 2017-07-22 | Discharge: 2017-07-22 | Disposition: A | Discharge status: Home / Self Care | Payer: Medicare Other | Source: Ambulatory Visit | Attending: Radiation Oncology | Admitting: Radiation Oncology

## 2017-07-22 ENCOUNTER — Ambulatory Visit: Payer: Medicare Other | Admitting: Oncology

## 2017-07-22 ENCOUNTER — Ambulatory Visit: Payer: Medicare Other

## 2017-07-22 ENCOUNTER — Other Ambulatory Visit: Payer: Medicare Other

## 2017-07-22 DIAGNOSIS — Z510 Encounter for antineoplastic radiation therapy: Secondary | ICD-10-CM | POA: Diagnosis not present

## 2017-07-23 ENCOUNTER — Other Ambulatory Visit: Payer: Self-pay | Admitting: Medical Oncology

## 2017-07-23 ENCOUNTER — Ambulatory Visit
Admission: RE | Admit: 2017-07-23 | Discharge: 2017-07-23 | Disposition: A | Discharge status: Home / Self Care | Payer: Medicare Other | Source: Ambulatory Visit | Attending: Radiation Oncology | Admitting: Radiation Oncology

## 2017-07-23 DIAGNOSIS — Z510 Encounter for antineoplastic radiation therapy: Secondary | ICD-10-CM | POA: Diagnosis not present

## 2017-07-24 ENCOUNTER — Telehealth: Payer: Self-pay | Admitting: Radiation Oncology

## 2017-07-24 ENCOUNTER — Ambulatory Visit: Payer: Medicare Other

## 2017-07-24 NOTE — Telephone Encounter (Signed)
Patient did not show for radiation treatment today. Phoned [facility] to inquire. Secretary unable to get anyone to answer phone on [address]. Requested the secretary have someone return my call with an update on the patient. Awaiting return call.

## 2017-07-25 ENCOUNTER — Ambulatory Visit: Payer: Medicare Other

## 2017-07-25 ENCOUNTER — Ambulatory Visit
Admission: RE | Admit: 2017-07-25 | Discharge: 2017-07-25 | Disposition: A | Discharge status: Home / Self Care | Payer: Medicare Other | Source: Ambulatory Visit | Attending: Radiation Oncology | Admitting: Radiation Oncology

## 2017-07-25 DIAGNOSIS — Z510 Encounter for antineoplastic radiation therapy: Secondary | ICD-10-CM | POA: Diagnosis not present

## 2017-07-28 ENCOUNTER — Ambulatory Visit: Payer: Medicare Other

## 2017-07-28 ENCOUNTER — Ambulatory Visit
Admission: RE | Admit: 2017-07-28 | Discharge: 2017-07-28 | Disposition: A | Discharge status: Home / Self Care | Payer: Medicare Other | Source: Ambulatory Visit | Attending: Radiation Oncology | Admitting: Radiation Oncology

## 2017-07-28 DIAGNOSIS — Z510 Encounter for antineoplastic radiation therapy: Secondary | ICD-10-CM | POA: Diagnosis not present

## 2017-07-29 ENCOUNTER — Ambulatory Visit
Admission: RE | Admit: 2017-07-29 | Discharge: 2017-07-29 | Disposition: A | Discharge status: Home / Self Care | Payer: Medicare Other | Source: Ambulatory Visit | Attending: Radiation Oncology | Admitting: Radiation Oncology

## 2017-07-29 ENCOUNTER — Encounter: Payer: Self-pay | Admitting: Radiation Oncology

## 2017-07-29 ENCOUNTER — Other Ambulatory Visit: Payer: Self-pay | Admitting: *Deleted

## 2017-07-29 DIAGNOSIS — Z510 Encounter for antineoplastic radiation therapy: Secondary | ICD-10-CM | POA: Diagnosis not present

## 2017-07-30 ENCOUNTER — Ambulatory Visit: Payer: Medicare Other

## 2017-07-30 NOTE — Progress Notes (Addendum)
  Radiation Oncology         [phone] ________________________________  Name: [name] MRN: [id number]  Date: 07/29/2017  DOB: April 24, 1941  End of Treatment Note  Diagnosis:   76 y.o. female with L3, L5 and S1 spinal metastasis   Indication for treatment:  Palliative      Radiation treatment dates:   07/14/2017 - 07/29/2017  Site/dose:   The lumbar spine (L2-SI joints) was treated to 30 Gy in 10 fractions of 3 Gy.   Beams/energy:   3D // 15X, 10X Photon  Narrative: The patient tolerated radiation treatment relatively well.   She reported persistent but decreased lumbar spine pain and bilateral leg pain/weakness (L>R) over the course of treatment. Her pain was aided by Tramadol 50 mg every six hours. At the beginning of her treatment she was confined to wheelchair but was able to ambulate with the aid of a walker by completion of treatment. 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 During her treatment she developed a UTI and was treated with antibiotics. She denied any dysuria or hematuria. She wore pads due to bowel incontinence and diarrhea. She also experienced significant appetite loss, improved with taking decadron. No evidence of thrush noted.   Plan: The patient has completed radiation treatment. The patient will return to radiation oncology clinic for routine followup in one month. I advised her to call or return sooner if she has any questions or concerns related to her recovery or treatment. ________________________________  Sheral Apley. Tammi Klippel, M.D.  This document serves as a record of services personally performed by Tyler Pita, MD. It was created on his behalf by Rae Lips, a trained medical scribe. The creation of this record is based on the scribe's personal observations and the provider's statements to them. This document has been checked and approved by the attending provider.

## 2017-08-05 ENCOUNTER — Other Ambulatory Visit: Payer: Medicare Other

## 2017-08-05 ENCOUNTER — Ambulatory Visit: Payer: Medicare Other

## 2017-08-05 ENCOUNTER — Ambulatory Visit: Payer: Medicare Other | Admitting: Internal Medicine

## 2017-08-11 ENCOUNTER — Telehealth: Payer: Self-pay | Admitting: Medical Oncology

## 2017-08-11 NOTE — Telephone Encounter (Signed)
ok 

## 2017-08-11 NOTE — Telephone Encounter (Addendum)
OT requests order for  OT three times a week  x 3 weeks.  Pt now at assisted living from [facility] . The goal is to get her back home.

## 2017-08-11 NOTE — Telephone Encounter (Signed)
I returned OT call . Left message. I need to know who referred pt to rehab and if this is an extension of OT.

## 2017-08-12 ENCOUNTER — Telehealth: Payer: Self-pay

## 2017-08-12 NOTE — Telephone Encounter (Signed)
Called order for OT three times a week x 3 weeks.

## 2017-08-12 NOTE — Telephone Encounter (Signed)
Nicolette with [facility] called requesting PT orders. 2x/week for 4 weeks. VO given per Diane RN/ Dr Julien Nordmann.   Nicolette's [phone]

## 2017-08-13 ENCOUNTER — Telehealth: Payer: Self-pay | Admitting: Physician Assistant

## 2017-08-13 NOTE — Telephone Encounter (Signed)
I have not received an FL2 form to complete.  I received [facility] Orders for Medication and Treatment forms and the New order/Notification/Clarification form regarding the mirtazapine today and completed them.  I have placed them in Ms. Stevenson's box for faxing.

## 2017-08-13 NOTE — Telephone Encounter (Signed)
[place] Is calling to see if the patients FL2 form has been completed and signed   Best number to call is [phone]

## 2017-08-15 ENCOUNTER — Telehealth: Payer: Self-pay

## 2017-08-15 NOTE — Telephone Encounter (Signed)
Forms in box have been faxed.

## 2017-08-19 ENCOUNTER — Telehealth: Payer: Self-pay

## 2017-08-19 ENCOUNTER — Ambulatory Visit: Payer: Medicare Other

## 2017-08-19 ENCOUNTER — Encounter: Payer: Self-pay | Admitting: Internal Medicine

## 2017-08-19 ENCOUNTER — Other Ambulatory Visit (HOSPITAL_BASED_OUTPATIENT_CLINIC_OR_DEPARTMENT_OTHER): Payer: Medicare Other

## 2017-08-19 ENCOUNTER — Ambulatory Visit (HOSPITAL_BASED_OUTPATIENT_CLINIC_OR_DEPARTMENT_OTHER): Payer: Medicare Other | Admitting: Internal Medicine

## 2017-08-19 VITALS — BP 115/62 | HR 81 | Temp 98.1°F | Resp 18 | Ht 65.0 in | Wt 120.6 lb

## 2017-08-19 DIAGNOSIS — C3491 Malignant neoplasm of unspecified part of right bronchus or lung: Secondary | ICD-10-CM

## 2017-08-19 DIAGNOSIS — R634 Abnormal weight loss: Secondary | ICD-10-CM | POA: Diagnosis not present

## 2017-08-19 DIAGNOSIS — C3492 Malignant neoplasm of unspecified part of left bronchus or lung: Secondary | ICD-10-CM | POA: Diagnosis not present

## 2017-08-19 DIAGNOSIS — R530 Neoplastic (malignant) related fatigue: Secondary | ICD-10-CM

## 2017-08-19 DIAGNOSIS — E46 Unspecified protein-calorie malnutrition: Secondary | ICD-10-CM | POA: Diagnosis not present

## 2017-08-19 LAB — COMPREHENSIVE METABOLIC PANEL
ALBUMIN: 2.8 g/dL — AB (ref 3.5–5.0)
ALK PHOS: 120 U/L (ref 40–150)
ALT: 49 U/L (ref 0–55)
ANION GAP: 7 meq/L (ref 3–11)
AST: 17 U/L (ref 5–34)
BILIRUBIN TOTAL: 0.61 mg/dL (ref 0.20–1.20)
BUN: 18.4 mg/dL (ref 7.0–26.0)
CALCIUM: 9.4 mg/dL (ref 8.4–10.4)
CO2: 21 mEq/L — ABNORMAL LOW (ref 22–29)
Chloride: 104 mEq/L (ref 98–109)
Creatinine: 0.7 mg/dL (ref 0.6–1.1)
Glucose: 87 mg/dl (ref 70–140)
Potassium: 4.5 mEq/L (ref 3.5–5.1)
Sodium: 132 mEq/L — ABNORMAL LOW (ref 136–145)
TOTAL PROTEIN: 6.3 g/dL — AB (ref 6.4–8.3)

## 2017-08-19 LAB — CBC WITH DIFFERENTIAL/PLATELET
BASO%: 0.4 % (ref 0.0–2.0)
BASOS ABS: 0 10*3/uL (ref 0.0–0.1)
EOS ABS: 0 10*3/uL (ref 0.0–0.5)
EOS%: 0.1 % (ref 0.0–7.0)
HEMATOCRIT: 35.1 % (ref 34.8–46.6)
HGB: 11.7 g/dL (ref 11.6–15.9)
LYMPH#: 0.5 10*3/uL — AB (ref 0.9–3.3)
LYMPH%: 4.3 % — ABNORMAL LOW (ref 14.0–49.7)
MCH: 29.9 pg (ref 25.1–34.0)
MCHC: 33.3 g/dL (ref 31.5–36.0)
MCV: 89.8 fL (ref 79.5–101.0)
MONO#: 0.9 10*3/uL (ref 0.1–0.9)
MONO%: 7.4 % (ref 0.0–14.0)
NEUT#: 10.7 10*3/uL — ABNORMAL HIGH (ref 1.5–6.5)
NEUT%: 87.8 % — AB (ref 38.4–76.8)
Platelets: 165 10*3/uL (ref 145–400)
RBC: 3.9 10*6/uL (ref 3.70–5.45)
RDW: 17.2 % — ABNORMAL HIGH (ref 11.2–14.5)
WBC: 12.2 10*3/uL — ABNORMAL HIGH (ref 3.9–10.3)

## 2017-08-19 NOTE — Telephone Encounter (Signed)
Printed avs and caslender for upcoming appointment per 11/6 los

## 2017-08-19 NOTE — Progress Notes (Signed)
[name] [phone]   [phone]  OFFICE PROGRESS NOTE  Harrison Mons, PA-C [address] [place] [address]  DIAGNOSIS:  1) recurrent non-small cell lung cancer, adenocarcinoma initially diagnosed as stage IB (T2a, N0, M0) in December 2010. 2) history of acute pulmonary embolism in the right lower lobe lobe are, segmental and subsegmental pulmonary arteries diagnosed in January 2013.  PRIOR THERAPY: 1.Status post right upper lobectomy on September 28, 2009 under the care of Dr. Arlyce Dice. The patient refused adjuvant chemotherapy at that time. She had disease recurrence in June of 2012.  2.Status post 4 cycles of systemic chemotherapy with carboplatin for an AUC of 6 and paclitaxel at 200 mg per meter squared and Avastin at 15 mg/kg according to the ECOG protocol #5508.  3. Chemotherapy with Avastin 15 mg/kg according to the ECOG protocol #5508 status post 32 cycles. Last cycle was given on 06/30/2013 discontinued secondary to disease progression and persistent proteinuria. 4. Tarceva 150 mg by mouth daily, started 08/09/2013, status post 10 months of treatment. 5. Tarceva 100 mg by mouth daily started 07/02/2014, status post 19 months, discontinued secondary to disease progression. 6.  Palliative radiotherapy to the lumbosacral spine under the care of Dr. Tammi Klippel completed July 29, 2017.  CURRENT THERAPY: Immunotherapy with Nivolumab 240 mg IV every 2 weeks status post 34 cycles. First dose was given 02/20/2016.  INTERVAL HISTORY: [name] 76 y.o. female returns to the clinic today for follow-up visit accompanied by her daughter.  The patient is currently a resident of the [facility] [place] assisted living facility.  She continues to have increasing fatigue and weakness.  She is underlying physical therapy at the facility but not at a consistent basis.  She has been off treatment for several weeks now.  She was found to have metastatic  disease in the lumbar spine and the patient underwent palliative radiotherapy under the care of Dr. Tammi Klippel.  The patient denied having any current chest pain, shortness of breath, cough or hemoptysis.  She denied having any fever or chills.  She has no nausea, vomiting, diarrhea or constipation.  She lost a few pounds since her last visit.  She is here today for reevaluation and discussion of her treatment options.  MEDICAL HISTORY: Past Medical History:  Diagnosis Date  . Bilateral lung cancer ([place]) 03/08/2011   recurrent  . Bone cancer ([place])    lung ca with spine mets  . Chronic fatigue 02/14/2016  . Dysuria 03/04/2017  . Encounter for antineoplastic immunotherapy 02/14/2016  . Foot pain 09/02/2011  . Glaucoma   . Hip pain 09/02/2011  . Hypercholesterolemia   . Hypertension   . Hypertension 10/29/2016  . Hypothyroidism   . Lung cancer ([place]) 03/08/2011   recurrent    ALLERGIES:  is allergic to cymbalta [duloxetine hcl]; fish allergy; amitriptyline; codeine; mirabegron; solifenacin; and sulfa antibiotics.  MEDICATIONS:  Current Outpatient Medications  Medication Sig Dispense Refill  . acetaminophen (TYLENOL) 325 MG tablet Take 650 mg by mouth every 6 (six) hours as needed for moderate pain.     Marland Kitchen amLODipine (NORVASC) 5 MG tablet Take 10 mg by mouth daily. Per [name], NP    . aspirin 81 MG tablet Take 81 mg by mouth daily.    . clonazePAM (KLONOPIN) 0.5 MG tablet Take 1 tablet (0.5 mg total) by mouth at bedtime. Per [name], NP 15 tablet 0  . cyanocobalamin (,VITAMIN B-12,) 1000 MCG/ML injection Inject 1,000  mcg into the muscle every 30 (thirty) days.    Marland Kitchen dexamethasone (DECADRON) 2 MG tablet Take 1 tablet (2 mg total) by mouth 2 (two) times daily. Taper instructions per rad onc 20 tablet 0  . dorzolamide-timolol (COSOPT) 22.3-6.8 MG/ML ophthalmic solution Place 2 drops into both eyes 2 (two) times daily. Per [name], NP    . feeding supplement, ENSURE ENLIVE, (ENSURE  ENLIVE) LIQD Take 237 mLs by mouth 2 (two) times daily between meals. 237 mL 12  . fluticasone (FLONASE) 50 MCG/ACT nasal spray Place 1 spray into both nostrils daily as needed for allergies or rhinitis.    Marland Kitchen levothyroxine (SYNTHROID, LEVOTHROID) 88 MCG tablet Take 1 tablet (88 mcg total) by mouth daily before breakfast. 90 tablet 3  . lidocaine-prilocaine (EMLA) cream Apply 1 application topically as needed (prior to port access). 30 g 0  . lisinopril (PRINIVIL,ZESTRIL) 40 MG tablet Take 1 tablet (40 mg total) by mouth daily. 90 tablet 3  . meloxicam (MOBIC) 7.5 MG tablet TAKE 1 TABLET BY MOUTH EVERY DAY (Patient taking differently: TAKE 1 TABLET BY MOUTH EVERY DAY AS NEEDED FOR PAIN) 30 tablet 0  . mirtazapine (REMERON) 7.5 MG tablet Take 1 tablet (7.5 mg total) by mouth at bedtime. (Patient not taking: Reported on 07/14/2017) 30 tablet 2  . PREMARIN vaginal cream INSERT BLUEBERRY SIZE AMOUNT OF ESTROGEN CREAM INTO VAGINA TWICE WEEKLY.  1  . traMADol (ULTRAM) 50 MG tablet Take 1 tablet (50 mg total) by mouth every 6 (six) hours as needed for severe pain. 20 tablet 0   No current facility-administered medications for this visit.     SURGICAL HISTORY:  Past Surgical History:  Procedure Laterality Date  . LUNG LOBECTOMY  09/28/2009   right upper  . THORACOTOMY  09/28/2009   mini  . VIDEO ASSISTED THORACOSCOPY  09/28/2009    REVIEW OF SYSTEMS:  Constitutional: positive for fatigue and weight loss Eyes: negative Ears, nose, mouth, throat, and face: negative Respiratory: positive for dyspnea on exertion Cardiovascular: negative Gastrointestinal: negative Genitourinary:negative Integument/breast: negative Hematologic/lymphatic: negative Musculoskeletal:positive for arthralgias and muscle weakness Neurological: negative Behavioral/Psych: negative Endocrine: negative Allergic/Immunologic: negative   PHYSICAL EXAMINATION: General appearance: alert, cooperative, fatigued and no  distress Head: Normocephalic, without obvious abnormality, atraumatic Neck: no adenopathy, no JVD, supple, symmetrical, trachea midline and thyroid not enlarged, symmetric, no tenderness/mass/nodules Lymph nodes: Cervical, supraclavicular, and axillary nodes normal. Resp: clear to auscultation bilaterally Back: symmetric, no curvature. ROM normal. No CVA tenderness. Cardio: regular rate and rhythm, S1, S2 normal, no murmur, click, rub or gallop GI: soft, non-tender; bowel sounds normal; no masses,  no organomegaly Extremities: extremities normal, atraumatic, no cyanosis or edema Neurologic: Alert and oriented X 3, normal strength and tone. Normal symmetric reflexes. Normal coordination and gait  ECOG PERFORMANCE STATUS: 2 - Symptomatic, <50% confined to bed  Blood pressure 115/62, pulse 81, temperature 98.1 F (36.7 C), temperature source Oral, resp. rate 18, height 5\' 5"  (1.651 m), weight 120 lb 9.6 oz (54.7 kg), SpO2 97 %.  LABORATORY DATA: Lab Results  Component Value Date   WBC 12.2 (H) 08/19/2017   HGB 11.7 08/19/2017   HCT 35.1 08/19/2017   MCV 89.8 08/19/2017   PLT 165 08/19/2017      Chemistry      Component Value Date/Time   NA 132 (L) 08/19/2017 1024   K 4.5 08/19/2017 1024   CL 107 07/17/2017 0901   CL 107 03/30/2013 1036   CO2 21 (L) 08/19/2017 1024  BUN 18.4 08/19/2017 1024   CREATININE 0.7 08/19/2017 1024      Component Value Date/Time   CALCIUM 9.4 08/19/2017 1024   ALKPHOS 120 08/19/2017 1024   AST 17 08/19/2017 1024   ALT 49 08/19/2017 1024   BILITOT 0.61 08/19/2017 1024       RADIOGRAPHIC STUDIES: No results found.  ASSESSMENT AND PLAN:  This is a very pleasant 76 years old African-American female with recurrent non-small cell lung cancer, adenocarcinoma status post several chemotherapy regimens and currently on treatment with immunotherapy with Nivolumab status post 34 cycles. The patient has been tolerating this treatment well but unfortunately  she had evidence of metastatic disease in the lower lumbar and sacral spines with epidural extension. She underwent palliative radiotherapy to the lumbosacral spines under the care of Dr. Tammi Klippel. She is still complaining of increasing fatigue and weakness and she is currently on an assisted living facility undergoing physical therapy. Her performance status declined compared to before. I had a lengthy discussion with the patient and her daughter today about her current condition and treatment options. Definitely I would like to see the patient and much better shape and good performance status before considering her for other treatment option. I discussed with her the option of palliative care and hospice referral versus consideration of palliative systemic chemotherapy with single agent Alimta 500 mg/M2 every 3 weeks. The patient is a still interested in treatment but she is too weak to start any treatment at this point. I will arrange for her to come back for follow-up visit in 1 month for evaluation and more detailed discussion of her treatment. For the weight loss and malnutrition, encourage the patient to increase her oral intake and also to drink 2 cans of Ensure on a daily basis. The patient was advised to call immediately if she has any concerning symptoms in the interval. The patient voices understanding of current disease status and treatment options and is in agreement with the current care plan. All questions were answered. The patient knows to call the clinic with any problems, questions or concerns. We can certainly see the patient much sooner if necessary.  Disclaimer: This note was dictated with voice recognition software. Similar sounding words can inadvertently be transcribed and may not be corrected upon review.

## 2017-08-25 ENCOUNTER — Telehealth: Payer: Self-pay | Admitting: Physician Assistant

## 2017-08-25 NOTE — Telephone Encounter (Signed)
Copied from [place] #[id number]. Topic: Quick Communication - See Telephone Encounter >> Aug 25, 2017  8:56 AM [place], Laban Emperor wrote: CRM for notification. See Telephone encounter for:  Jonelle Sidle with home health was calling to get verbal orders. [phone] 08/25/17.

## 2017-08-25 NOTE — Telephone Encounter (Signed)
Chelle,   They needed a verbal order for social work evaluation and she is having urinary symptoms so they wanted an order.        OK'd told them I would call if it wasn't ok.

## 2017-08-26 NOTE — Telephone Encounter (Signed)
YES! OK to test urine.

## 2017-08-28 ENCOUNTER — Telehealth: Payer: Self-pay | Admitting: Physician Assistant

## 2017-08-28 NOTE — Telephone Encounter (Unsigned)
Copied from [place] [phone]. Topic: Quick Communication - See Telephone Encounter >> Aug 28, 2017  5:55 PM Boyd Kerbs wrote: CRM for notification. See Telephone encounter for:  Clonazepam .5mg  patient saying it is not helping her and she is not sleeping.  08/28/17.

## 2017-08-29 ENCOUNTER — Ambulatory Visit: Payer: Self-pay | Admitting: Urology

## 2017-08-29 MED ORDER — CLONAZEPAM 1 MG PO TABS: 1.0000 mg | ORAL_TABLET | Freq: Every day | ORAL | 0 refills | Status: DC

## 2017-08-29 NOTE — Telephone Encounter (Signed)
Called Pt and she advised me to call [facility] [phone]. I called and there was no answer and left a VM to call back. There is a Rx ready to be faxed. We just need to know where.

## 2017-08-29 NOTE — Telephone Encounter (Signed)
Let's try INCREASING  The dose from 0.5 mg to 1 mg. She can use up the 0.5 mg that she has by taking 2 of them together, then fill the 1 mg tablets.  Meds ordered this encounter  Medications  . clonazePAM (KLONOPIN) 1 MG tablet    Sig: Take 1 tablet (1 mg total) at bedtime by mouth.    Dispense:  30 tablet    Refill:  0    Order Specific Question:   Supervising Provider    Answer:   Brigitte Pulse, EVA N [[id number]]

## 2017-08-29 NOTE — Telephone Encounter (Signed)
Medication management - Clonazepam sent to [facility].

## 2017-09-01 ENCOUNTER — Telehealth: Payer: Self-pay | Admitting: Physician Assistant

## 2017-09-01 ENCOUNTER — Telehealth: Payer: Self-pay

## 2017-09-01 NOTE — Telephone Encounter (Signed)
Nicolette from [facility] home health called. She had faxed UA results to Dr Jacqulynn Cadet and Dr Julien Nordmann. She will expect any needed orders from Dr Dellis Filbert. She was letting Dr Julien Nordmann know the results.

## 2017-09-01 NOTE — Telephone Encounter (Signed)
Facility is requesting written order for UA and Cx.  Need it faxed to [phone].  Office is also requesting antibiotic stating urine has come back positive.

## 2017-09-01 NOTE — Telephone Encounter (Signed)
Copied from [place] [phone]. >> Sep 01, 2017  1:11 PM Tonny Bollman wrote: Elmyra Ricks nurse with [facility] [phone]    faxing u/a results would like a  call back to let know if need to redraw u/a

## 2017-09-02 ENCOUNTER — Telehealth: Payer: Self-pay

## 2017-09-02 ENCOUNTER — Telehealth: Payer: Self-pay | Admitting: Physician Assistant

## 2017-09-02 NOTE — Progress Notes (Addendum)
Radiation Oncology         [phone] ________________________________  Name: [name] MRN: [id number]  Date: 09/03/2017  DOB: 03/06/1941  Post Treatment Note  CC: Harrison Mons, PA-C  Nicholas Lose, MD  Diagnosis:   76 y.o. 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  Interval Since Last Radiation:  4 weeks; Palliative radiotherapy  07/14/2017 - 07/29/2017:  The lumbar spine (L2-SI joints) was treated to 30 Gy in 10 fractions of 3 Gy.   Narrative:  The patient returns today for routine follow-up.  She tolerated radiation treatment relatively well.   She reported persistent but decreased lumbar spine pain and bilateral leg pain/weakness (L>R) over the course of treatment. Her pain was aided by Tramadol 50 mg every six hours. At the beginning of her treatment she was confined to wheelchair but was able to ambulate with the aid of a walker by completion of treatment. 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 During her treatment she developed a UTI and was treated with antibiotics. She denied any dysuria or hematuria. She wore pads due to bowel incontinence and diarrhea. She also experienced significant appetite loss, improved with taking decadron. No evidence of thrush noted.                       On review of systems, the patient states that she is doing well overall.  She has completed steroids at this point area she reports almost complete resolution of her low back pain since completing radiotherapy. She now only has occasional twinges of pain in her left lower back for which she takes tramadol as needed. She continues with generalized weakness and fatigue but feels this is gradually improving. She is currently working with physical therapy at [facility] approximately 3 times per week but admits that this has not been entirely consistent over the past  several weeks due to intermittent stomach issues. She denies any numbness or tingling in the upper or lower extremities.   She has noticed some increased swelling in the right lower extremity over the past 24 hours. There is some mild tenderness in the right calf associated with the swelling. She first noticed this yesterday and was hopeful that it would go down overnight but it has not improved. She has not had any recent injury to the right lower extremity. She denies recent fever, chills or night sweats. She reports a decent appetite and is maintaining her weight. She denies any increased shortness of breath, cough, chest pain or dyspnea.  She had previously been on maintenance immunotherapy with Nivolumab status post 34 cycles. This was discontinued with the recent finding of brain metastases. She is currently being considered for palliative systemic chemotherapy with single agent Alimta but at her most recent visit with Dr. Julien Nordmann, she was felt to be too week to proceed with starting treatment. She has a planned follow-up with him on 09/16/17.  ALLERGIES:  is allergic to cymbalta [duloxetine hcl]; fish allergy; amitriptyline; codeine; mirabegron; solifenacin; and sulfa antibiotics.  Meds: Current Outpatient Medications  Medication Sig Dispense Refill  . amLODipine (NORVASC) 5 MG tablet Take 10 mg by mouth daily. Per [name], NP    . aspirin 81 MG tablet Take 81 mg by mouth daily.    . clonazePAM (KLONOPIN) 1 MG tablet Take 1 tablet (1 mg total) at bedtime by  mouth. 30 tablet 0  . cyanocobalamin (,VITAMIN B-12,) 1000 MCG/ML injection Inject 1,000 mcg into the muscle every 30 (thirty) days.    . dorzolamide-timolol (COSOPT) 22.3-6.8 MG/ML ophthalmic solution Place 2 drops into both eyes 2 (two) times daily. Per [name], NP    . feeding supplement, ENSURE ENLIVE, (ENSURE ENLIVE) LIQD Take 237 mLs by mouth 2 (two) times daily between meals. 237 mL 12  . levothyroxine (SYNTHROID,  LEVOTHROID) 88 MCG tablet Take 1 tablet (88 mcg total) by mouth daily before breakfast. 90 tablet 3  . lidocaine-prilocaine (EMLA) cream Apply 1 application topically as needed (prior to port access). 30 g 0  . lisinopril (PRINIVIL,ZESTRIL) 40 MG tablet Take 1 tablet (40 mg total) by mouth daily. 90 tablet 3  . traMADol (ULTRAM) 50 MG tablet Take 1 tablet (50 mg total) by mouth every 6 (six) hours as needed for severe pain. 20 tablet 0  . acetaminophen (TYLENOL) 325 MG tablet Take 650 mg by mouth every 6 (six) hours as needed for moderate pain.     . fluticasone (FLONASE) 50 MCG/ACT nasal spray Place 1 spray into both nostrils daily as needed for allergies or rhinitis.    . meloxicam (MOBIC) 7.5 MG tablet TAKE 1 TABLET BY MOUTH EVERY DAY (Patient not taking: Reported on 09/03/2017) 30 tablet 0  . mirtazapine (REMERON) 7.5 MG tablet Take 1 tablet (7.5 mg total) by mouth at bedtime. (Patient not taking: Reported on 09/03/2017) 30 tablet 2  . PREMARIN vaginal cream INSERT BLUEBERRY SIZE AMOUNT OF ESTROGEN CREAM INTO VAGINA TWICE WEEKLY.  1   No current facility-administered medications for this encounter.     Physical Findings:  height is 5\' 5"  (1.651 m) and weight is 120 lb (54.4 kg). Her oral temperature is 97.8 F (36.6 C). Her blood pressure is 111/54 (abnormal) and her pulse is 99. Her respiration is 16 and oxygen saturation is 98%.  Pain Assessment Pain Score: 0-No pain/10 In general this is a well appearing African-American female in no acute distress. She's alert and oriented x4 and appropriate throughout the examination. Cardiopulmonary assessment is negative for acute distress and she exhibits normal effort. There is 2+ pitting edema in the right lower extremity. I am unable to palpate a pulse on the dorsum of the right foot but the lower extremity is warm to touch and without evidence of cyanosis. There is no associated erythema but there is mild tenderness to palpation in the right calf.  There is normal sensation in the right lower extremity to light touch.    Lab Findings: Lab Results  Component Value Date   WBC 12.2 (H) 08/19/2017   HGB 11.7 08/19/2017   HCT 35.1 08/19/2017   MCV 89.8 08/19/2017   PLT 165 08/19/2017     Radiographic Findings: No results found.  RLE Venous doppler ultrasound performed and call report received indicating extensive DVT from the groin down to the foot on the right.   Impression/Plan: 1. 77 y.o. 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 She appears to have recovered well from the effects of radiotherapy.  She will continue working with physical therapy to regain her strength and endurance. We discussed that while we are happy to continue to participate in her care if clinically indicated, at this point, we will plan to see her back on an as-needed basis. She will continue in routine follow-up with Dr. Julien Nordmann for disease surveillance and  management. She has a planned upcoming appointment on 09/16/2017 and plans to further discuss the potential for initiating palliative chemotherapy at that time.  She is advised to call with any questions or concerns related to her previous radiotherapy. She is comfortable with this plan. 2. Acute DVT in the right lower extremity.  This finding was confirmed on venous Doppler ultrasound of the RLE today. I have discussed this finding with Dr. Julien Nordmann who recommended starting the patient on subcutaneous Lovenox 1.5 mg/kg daily. Her first dose of Lovenox 80 mg was administered subcutaneously here in the department today, prior to discharging her back to [facility] where she is currently residing.  I have provided a written prescription for Lovenox 1.5 mg/kg (80mg ) to be given daily at [facility] beginning 09/04/17.  She will be maintained on this dose daily until her follow-up with Dr. Julien Nordmann on 09/16/2017 at which time he will make further  recommendations regarding any dose adjustments and length of treatment. I would anticipate that she will be on this for a minimum of 6 months and potential consideration for long-term anticoagulation given her prior history of DVT/PE in the past.    Nicholos Johns, PA-C

## 2017-09-02 NOTE — Telephone Encounter (Unsigned)
Copied from [place] [id number]. Topic: Inquiry >> Sep 02, 2017 12:02 PM Neva Seat wrote:   [phone] - [place]   Calling for pt's urine results.

## 2017-09-03 ENCOUNTER — Other Ambulatory Visit: Payer: Self-pay

## 2017-09-03 ENCOUNTER — Telehealth: Payer: Self-pay | Admitting: Medical Oncology

## 2017-09-03 ENCOUNTER — Ambulatory Visit (HOSPITAL_COMMUNITY)
Admission: RE | Admit: 2017-09-03 | Discharge: 2017-09-03 | Disposition: A | Discharge status: Home / Self Care | Payer: Medicare Other | Source: Ambulatory Visit | Attending: Physician Assistant | Admitting: Physician Assistant

## 2017-09-03 ENCOUNTER — Ambulatory Visit
Admission: RE | Admit: 2017-09-03 | Discharge: 2017-09-03 | Disposition: A | Discharge status: Home / Self Care | Payer: Medicare Other | Source: Ambulatory Visit | Attending: Urology | Admitting: Urology

## 2017-09-03 ENCOUNTER — Encounter: Payer: Self-pay | Admitting: Urology

## 2017-09-03 ENCOUNTER — Telehealth: Payer: Self-pay

## 2017-09-03 VITALS — BP 111/54 | HR 99 | Temp 97.8°F | Resp 16 | Ht 65.0 in | Wt 120.0 lb

## 2017-09-03 DIAGNOSIS — I82491 Acute embolism and thrombosis of other specified deep vein of right lower extremity: Secondary | ICD-10-CM | POA: Diagnosis not present

## 2017-09-03 DIAGNOSIS — I82421 Acute embolism and thrombosis of right iliac vein: Secondary | ICD-10-CM | POA: Diagnosis not present

## 2017-09-03 DIAGNOSIS — I82409 Acute embolism and thrombosis of unspecified deep veins of unspecified lower extremity: Secondary | ICD-10-CM | POA: Insufficient documentation

## 2017-09-03 DIAGNOSIS — Z791 Long term (current) use of non-steroidal anti-inflammatories (NSAID): Secondary | ICD-10-CM | POA: Insufficient documentation

## 2017-09-03 DIAGNOSIS — M7989 Other specified soft tissue disorders: Secondary | ICD-10-CM | POA: Insufficient documentation

## 2017-09-03 DIAGNOSIS — C3411 Malignant neoplasm of upper lobe, right bronchus or lung: Secondary | ICD-10-CM | POA: Insufficient documentation

## 2017-09-03 DIAGNOSIS — C3491 Malignant neoplasm of unspecified part of right bronchus or lung: Secondary | ICD-10-CM

## 2017-09-03 DIAGNOSIS — Z79899 Other long term (current) drug therapy: Secondary | ICD-10-CM | POA: Diagnosis not present

## 2017-09-03 DIAGNOSIS — Z7982 Long term (current) use of aspirin: Secondary | ICD-10-CM | POA: Insufficient documentation

## 2017-09-03 DIAGNOSIS — C3492 Malignant neoplasm of unspecified part of left bronchus or lung: Secondary | ICD-10-CM

## 2017-09-03 DIAGNOSIS — Z882 Allergy status to sulfonamides status: Secondary | ICD-10-CM | POA: Insufficient documentation

## 2017-09-03 DIAGNOSIS — Z885 Allergy status to narcotic agent status: Secondary | ICD-10-CM | POA: Diagnosis not present

## 2017-09-03 DIAGNOSIS — Z888 Allergy status to other drugs, medicaments and biological substances status: Secondary | ICD-10-CM | POA: Diagnosis not present

## 2017-09-03 DIAGNOSIS — Z79891 Long term (current) use of opiate analgesic: Secondary | ICD-10-CM | POA: Insufficient documentation

## 2017-09-03 DIAGNOSIS — I82431 Acute embolism and thrombosis of right popliteal vein: Secondary | ICD-10-CM | POA: Insufficient documentation

## 2017-09-03 DIAGNOSIS — C7951 Secondary malignant neoplasm of bone: Secondary | ICD-10-CM | POA: Diagnosis present

## 2017-09-03 DIAGNOSIS — M545 Low back pain: Secondary | ICD-10-CM | POA: Diagnosis not present

## 2017-09-03 DIAGNOSIS — I82411 Acute embolism and thrombosis of right femoral vein: Secondary | ICD-10-CM | POA: Diagnosis not present

## 2017-09-03 DIAGNOSIS — R5383 Other fatigue: Secondary | ICD-10-CM | POA: Diagnosis not present

## 2017-09-03 DIAGNOSIS — Z923 Personal history of irradiation: Secondary | ICD-10-CM | POA: Diagnosis not present

## 2017-09-03 DIAGNOSIS — I82401 Acute embolism and thrombosis of unspecified deep veins of right lower extremity: Secondary | ICD-10-CM

## 2017-09-03 DIAGNOSIS — C7931 Secondary malignant neoplasm of brain: Secondary | ICD-10-CM | POA: Diagnosis not present

## 2017-09-03 MED ORDER — ENOXAPARIN SODIUM 80 MG/0.8ML ~~LOC~~ SOLN
1.5000 mg/kg | SUBCUTANEOUS | Status: AC
  Administered 2017-09-03: 80 mg via SUBCUTANEOUS
  Filled 2017-09-03 (×2): qty 0.8

## 2017-09-03 NOTE — Telephone Encounter (Signed)
Addressed yesterday. The UCx was NEGATIVE (there was some bacteria, but not indicative of UTI). Requested details of patient's symptoms and recollection of clean catch specimen.

## 2017-09-03 NOTE — Progress Notes (Signed)
Right lower extremity venous duplex has been completed. There is evidence of age indeterminate deep vein thrombosis involving the external iliac, common femoral, profunda femoral, femoral, popliteal, and peroneal veins of the right lower extremity.  There is also evidence of superficial vein thrombosis involving the lesser saphenous vein of the right lower extremity.  Results were given to Kiana PA.  09/03/17 10:12 AM [name] RVT

## 2017-09-03 NOTE — Telephone Encounter (Signed)
See phone note dated 09/03/17.

## 2017-09-03 NOTE — Telephone Encounter (Signed)
My notes were to be faxed back to the facility yesterday.  Please verify that they received our faxed reply.

## 2017-09-03 NOTE — Telephone Encounter (Signed)
Please call ?Santiago Glad at [facility] re lovenox delay

## 2017-09-03 NOTE — Telephone Encounter (Signed)
Copied from [place] [id number]. Topic: General - Other >> Sep 02, 2017  2:20 PM Bea Graff, NT wrote: Reason for CRM: The resident care manager from [facility] is calling because they spoke to someone yesterday about receiving orders for this pt to receive a UA and the results, both still have not been faxed. They were also told they would receive an order to up her clonazepam to them and to her pharmacy from 0.5mg  to 1mg . They have not received this at either place. The pharmacy [facility] is the pharmacy and the cut off time is 4pm. The phone number to the pharmacy is [phone] opt 2. Also please contact Marry Guan at [facility] for anything with the pts needs. The pt does get confused and would be easier for the resident care manager to take the calls. CB#: [phone]

## 2017-09-08 ENCOUNTER — Encounter: Payer: Self-pay | Admitting: *Deleted

## 2017-09-08 NOTE — Progress Notes (Signed)
[place] with Santiago Glad from [facility].  Who asked to speak with Ashlyn, PA-C regarding patient's Lovenox injection. I let her know that we received her fax and I would get it signed and fax it back to her this morning. I explained that my name was Khiley Lieser one of the nurses that works with [employer]. 1015 Faxed Lovenox injection signed information back to [facility] at [phone] to Santiago Glad.

## 2017-09-10 ENCOUNTER — Telehealth: Payer: Self-pay | Admitting: Medical Oncology

## 2017-09-10 NOTE — Telephone Encounter (Signed)
Yes. She needs to be on anticoagulation for at least 3 weeks before resuming any PT involving her legs.

## 2017-09-10 NOTE — Telephone Encounter (Signed)
Ae there any limitations for PT in this pt with DVT in her leg.

## 2017-09-11 ENCOUNTER — Telehealth: Payer: Self-pay | Admitting: Medical Oncology

## 2017-09-11 NOTE — Telephone Encounter (Signed)
Per Mohamed "Yes. She needs to be on anticoagulation for at least 3 weeks before resuming any PT involving her legs. " Left this message on VM for  [facility].

## 2017-09-15 ENCOUNTER — Other Ambulatory Visit: Payer: Self-pay | Admitting: Physician Assistant

## 2017-09-15 DIAGNOSIS — C3490 Malignant neoplasm of unspecified part of unspecified bronchus or lung: Secondary | ICD-10-CM

## 2017-09-15 NOTE — Telephone Encounter (Signed)
Copied from [place] [id number]. Topic: Quick Communication - Rx Refill/Question >> Sep 15, 2017 10:51 AM Robina Ade, Helene Kelp D wrote: Has the patient contacted their pharmacy? Yes (Agent: If no, request that the patient contact the pharmacy for the refill.) Preferred Pharmacy (with phone number or street name): [place]: Please be advised that RX refills may take up to 48 hours. We ask that you follow-up with your pharmacy. [facility] called and said that they need a refill on patients synthroid 1 tab a day and 1 aspirin 81mg  a day for patient sent to them for a 30 or 960 day supply. Their fax number is [phone]

## 2017-09-15 NOTE — Telephone Encounter (Signed)
[facility] requesting fax RX for synthroid and aspirin

## 2017-09-16 ENCOUNTER — Encounter: Payer: Self-pay | Admitting: Pharmacy Technician

## 2017-09-16 ENCOUNTER — Other Ambulatory Visit (HOSPITAL_BASED_OUTPATIENT_CLINIC_OR_DEPARTMENT_OTHER): Payer: Medicare Other

## 2017-09-16 ENCOUNTER — Ambulatory Visit (HOSPITAL_BASED_OUTPATIENT_CLINIC_OR_DEPARTMENT_OTHER): Payer: Medicare Other | Admitting: Internal Medicine

## 2017-09-16 ENCOUNTER — Telehealth: Payer: Self-pay | Admitting: Internal Medicine

## 2017-09-16 ENCOUNTER — Encounter: Payer: Self-pay | Admitting: Internal Medicine

## 2017-09-16 VITALS — BP 113/52 | HR 98 | Temp 97.7°F | Resp 24 | Ht 65.0 in | Wt 125.5 lb

## 2017-09-16 DIAGNOSIS — R530 Neoplastic (malignant) related fatigue: Secondary | ICD-10-CM

## 2017-09-16 DIAGNOSIS — Z7901 Long term (current) use of anticoagulants: Secondary | ICD-10-CM | POA: Diagnosis not present

## 2017-09-16 DIAGNOSIS — Z86718 Personal history of other venous thrombosis and embolism: Secondary | ICD-10-CM | POA: Diagnosis not present

## 2017-09-16 DIAGNOSIS — C3491 Malignant neoplasm of unspecified part of right bronchus or lung: Secondary | ICD-10-CM | POA: Diagnosis not present

## 2017-09-16 DIAGNOSIS — C3492 Malignant neoplasm of unspecified part of left bronchus or lung: Secondary | ICD-10-CM

## 2017-09-16 LAB — CBC WITH DIFFERENTIAL/PLATELET
BASO%: 0.5 % (ref 0.0–2.0)
Basophils Absolute: 0.1 10*3/uL (ref 0.0–0.1)
EOS ABS: 0.1 10*3/uL (ref 0.0–0.5)
EOS%: 0.9 % (ref 0.0–7.0)
HCT: 33.5 % — ABNORMAL LOW (ref 34.8–46.6)
HGB: 11 g/dL — ABNORMAL LOW (ref 11.6–15.9)
LYMPH%: 6.8 % — AB (ref 14.0–49.7)
MCH: 29.4 pg (ref 25.1–34.0)
MCHC: 32.9 g/dL (ref 31.5–36.0)
MCV: 89.6 fL (ref 79.5–101.0)
MONO#: 0.9 10*3/uL (ref 0.1–0.9)
MONO%: 7.5 % (ref 0.0–14.0)
NEUT#: 10.2 10*3/uL — ABNORMAL HIGH (ref 1.5–6.5)
NEUT%: 84.3 % — AB (ref 38.4–76.8)
PLATELETS: 308 10*3/uL (ref 145–400)
RBC: 3.75 10*6/uL (ref 3.70–5.45)
RDW: 17.5 % — ABNORMAL HIGH (ref 11.2–14.5)
WBC: 12.1 10*3/uL — ABNORMAL HIGH (ref 3.9–10.3)
lymph#: 0.8 10*3/uL — ABNORMAL LOW (ref 0.9–3.3)

## 2017-09-16 LAB — COMPREHENSIVE METABOLIC PANEL
ALBUMIN: 3 g/dL — AB (ref 3.5–5.0)
ALK PHOS: 134 U/L (ref 40–150)
ALT: 29 U/L (ref 0–55)
AST: 16 U/L (ref 5–34)
Anion Gap: 9 mEq/L (ref 3–11)
BUN: 12.5 mg/dL (ref 7.0–26.0)
CALCIUM: 9.5 mg/dL (ref 8.4–10.4)
CHLORIDE: 105 meq/L (ref 98–109)
CO2: 24 mEq/L (ref 22–29)
Creatinine: 0.8 mg/dL (ref 0.6–1.1)
EGFR: >60 mL/min/{1.73_m2} (ref >60)
GLUCOSE: 92 mg/dL (ref 70–140)
POTASSIUM: 3.8 meq/L (ref 3.5–5.1)
Sodium: 137 mEq/L (ref 136–145)
Total Bilirubin: 0.31 mg/dL (ref 0.20–1.20)
Total Protein: 6.7 g/dL (ref 6.4–8.3)

## 2017-09-16 NOTE — Progress Notes (Signed)
The patient's treatment plan has changed and is no longer receiving Opdivo. Drug assistance is therefore closed.

## 2017-09-16 NOTE — Progress Notes (Signed)
[facility] [phone]   [phone]  OFFICE PROGRESS NOTE  Harrison Mons, PA-C [address] [place] [address]  DIAGNOSIS:  1) recurrent non-small cell lung cancer, adenocarcinoma initially diagnosed as stage IB (T2a, N0, M0) in December 2010. 2) history of acute pulmonary embolism in the right lower lobe lobe are, segmental and subsegmental pulmonary arteries diagnosed in January 2013.  PRIOR THERAPY: 1.Status post right upper lobectomy on September 28, 2009 under the care of Dr. Arlyce Dice. The patient refused adjuvant chemotherapy at that time. She had disease recurrence in June of 2012.  2.Status post 4 cycles of systemic chemotherapy with carboplatin for an AUC of 6 and paclitaxel at 200 mg per meter squared and Avastin at 15 mg/kg according to the ECOG protocol #5508.  3. Chemotherapy with Avastin 15 mg/kg according to the ECOG protocol #5508 status post 32 cycles. Last cycle was given on 06/30/2013 discontinued secondary to disease progression and persistent proteinuria. 4. Tarceva 150 mg by mouth daily, started 08/09/2013, status post 10 months of treatment. 5. Tarceva 100 mg by mouth daily started 07/02/2014, status post 19 months, discontinued secondary to disease progression. 6.  Palliative radiotherapy to the lumbosacral spine under the care of Dr. Tammi Klippel completed July 29, 2017.  CURRENT THERAPY: Immunotherapy with Nivolumab 240 mg IV every 2 weeks status post 34 cycles. First dose was given 02/20/2016.  INTERVAL HISTORY: [name] 76 y.o. female returns to the clinic today for follow-up visit accompanied by her daughter.  The patient continues to complain of increasing fatigue and weakness.  She was recently diagnosed with deep venous thrombosis of the right lower extremities and she is currently on Lovenox injection.  She denied having any recent chest pain but has shortness of breath with exertion with no cough or  hemoptysis.  She has no fever or chills.  She denied having any nausea, vomiting, diarrhea or constipation.  She is not undergoing physical therapy at this point because of the recent deep venous thrombosis.  The patient is here today for evaluation and discussion of her treatment options.  MEDICAL HISTORY: Past Medical History:  Diagnosis Date  . Bilateral lung cancer ([place]) 03/08/2011   recurrent  . Bone cancer ([place])    lung ca with spine mets  . Chronic fatigue 02/14/2016  . Dysuria 03/04/2017  . Encounter for antineoplastic immunotherapy 02/14/2016  . Foot pain 09/02/2011  . Glaucoma   . Hip pain 09/02/2011  . Hypercholesterolemia   . Hypertension   . Hypertension 10/29/2016  . Hypothyroidism   . Lung cancer ([place]) 03/08/2011   recurrent    ALLERGIES:  is allergic to cymbalta [duloxetine hcl]; fish allergy; amitriptyline; codeine; mirabegron; solifenacin; and sulfa antibiotics.  MEDICATIONS:  Current Outpatient Medications  Medication Sig Dispense Refill  . acetaminophen (TYLENOL) 325 MG tablet Take 650 mg by mouth every 6 (six) hours as needed for moderate pain.     Marland Kitchen amLODipine (NORVASC) 5 MG tablet Take 10 mg by mouth daily. Per [name], NP    . aspirin 81 MG tablet Take 81 mg by mouth daily.    . clonazePAM (KLONOPIN) 1 MG tablet Take 1 tablet (1 mg total) at bedtime by mouth. 30 tablet 0  . cyanocobalamin (,VITAMIN B-12,) 1000 MCG/ML injection Inject 1,000 mcg into the muscle every 30 (thirty) days.    . dorzolamide-timolol (COSOPT) 22.3-6.8 MG/ML ophthalmic solution Place 2 drops into both eyes 2 (two) times daily. Per [name],  NP    . feeding supplement, ENSURE ENLIVE, (ENSURE ENLIVE) LIQD Take 237 mLs by mouth 2 (two) times daily between meals. 237 mL 12  . fluticasone (FLONASE) 50 MCG/ACT nasal spray Place 1 spray into both nostrils daily as needed for allergies or rhinitis.    Marland Kitchen levothyroxine (SYNTHROID, LEVOTHROID) 88 MCG tablet Take 1 tablet (88 mcg total) by  mouth daily before breakfast. 90 tablet 3  . lidocaine-prilocaine (EMLA) cream Apply 1 application topically as needed (prior to port access). 30 g 0  . lisinopril (PRINIVIL,ZESTRIL) 40 MG tablet Take 1 tablet (40 mg total) by mouth daily. 90 tablet 3  . meloxicam (MOBIC) 7.5 MG tablet TAKE 1 TABLET BY MOUTH EVERY DAY (Patient not taking: Reported on 09/03/2017) 30 tablet 0  . mirtazapine (REMERON) 7.5 MG tablet Take 1 tablet (7.5 mg total) by mouth at bedtime. (Patient not taking: Reported on 09/03/2017) 30 tablet 2  . PREMARIN vaginal cream INSERT BLUEBERRY SIZE AMOUNT OF ESTROGEN CREAM INTO VAGINA TWICE WEEKLY.  1  . traMADol (ULTRAM) 50 MG tablet Take 1 tablet (50 mg total) by mouth every 6 (six) hours as needed for severe pain. 20 tablet 0   No current facility-administered medications for this visit.     SURGICAL HISTORY:  Past Surgical History:  Procedure Laterality Date  . LUNG LOBECTOMY  09/28/2009   right upper  . THORACOTOMY  09/28/2009   mini  . VIDEO ASSISTED THORACOSCOPY  09/28/2009    REVIEW OF SYSTEMS:  A comprehensive review of systems was negative except for: Constitutional: positive for fatigue and weight loss Respiratory: positive for dyspnea on exertion Musculoskeletal: positive for arthralgias and muscle weakness   PHYSICAL EXAMINATION: General appearance: alert, cooperative, fatigued and no distress Head: Normocephalic, without obvious abnormality, atraumatic Neck: no adenopathy, no JVD, supple, symmetrical, trachea midline and thyroid not enlarged, symmetric, no tenderness/mass/nodules Lymph nodes: Cervical, supraclavicular, and axillary nodes normal. Resp: clear to auscultation bilaterally Back: symmetric, no curvature. ROM normal. No CVA tenderness. Cardio: regular rate and rhythm, S1, S2 normal, no murmur, click, rub or gallop GI: soft, non-tender; bowel sounds normal; no masses,  no organomegaly Extremities: extremities normal, atraumatic, no cyanosis or  edema  ECOG PERFORMANCE STATUS: 2 - Symptomatic, <50% confined to bed  Blood pressure (!) 113/52, pulse 98, temperature 97.7 F (36.5 C), temperature source Oral, resp. rate (!) 24, height 5\' 5"  (1.651 m), weight 125 lb 8 oz (56.9 kg), SpO2 100 %.  LABORATORY DATA: Lab Results  Component Value Date   WBC 12.1 (H) 09/16/2017   HGB 11.0 (L) 09/16/2017   HCT 33.5 (L) 09/16/2017   MCV 89.6 09/16/2017   PLT 308 09/16/2017      Chemistry      Component Value Date/Time   NA 132 (L) 08/19/2017 1024   K 4.5 08/19/2017 1024   CL 107 07/17/2017 0901   CL 107 03/30/2013 1036   CO2 21 (L) 08/19/2017 1024   BUN 18.4 08/19/2017 1024   CREATININE 0.7 08/19/2017 1024      Component Value Date/Time   CALCIUM 9.4 08/19/2017 1024   ALKPHOS 120 08/19/2017 1024   AST 17 08/19/2017 1024   ALT 49 08/19/2017 1024   BILITOT 0.61 08/19/2017 1024       RADIOGRAPHIC STUDIES: No results found.  ASSESSMENT AND PLAN:  This is a very pleasant 76 years old African-American female with recurrent non-small cell lung cancer, adenocarcinoma status post several chemotherapy regimens and currently on treatment with immunotherapy with  Nivolumab status post 34 cycles. The patient has been tolerating this treatment well but unfortunately she had evidence of metastatic disease in the lower lumbar and sacral spines with epidural extension. She underwent palliative radiotherapy to the lumbosacral spines under the care of Dr. Tammi Klippel. The patient continues to have increasing fatigue and weakness.  She was also recently diagnosed with deep venous thrombosis of the right lower extremity. She is not a good candidate to resume any systemic therapy at this point. I recommended for the patient to continue on observation for now.  I would see her back for follow-up visit in 1 month for reevaluation and discussion of her treatment options based on her performance status at that time. For the right lower extremity deep  venous thrombosis, she will continue on treatment with Lovenox for now.  The patient was advised to call immediately if she has any concerning symptoms in the interval. The patient voices understanding of current disease status and treatment options and is in agreement with the current care plan. All questions were answered. The patient knows to call the clinic with any problems, questions or concerns. We can certainly see the patient much sooner if necessary.  Disclaimer: This note was dictated with voice recognition software. Similar sounding words can inadvertently be transcribed and may not be corrected upon review.

## 2017-09-16 NOTE — Telephone Encounter (Signed)
Copied from [place] [phone]. Topic: Quick Communication - Rx Refill/Question >> Sep 16, 2017 10:50 AM Darl Householder, RMA wrote: Angie from the [facility] has called again and is requesting a call back at [phone]

## 2017-09-16 NOTE — Telephone Encounter (Signed)
On 10/06, I authorized a 90-day supply of the levothyroxine with 3 refills. As such, they should not need a new prescription.  OK to authorize the aspirin (see pended order).

## 2017-09-16 NOTE — Telephone Encounter (Signed)
Gave ave and calendar for January 2019

## 2017-09-17 MED ORDER — ASPIRIN 81 MG PO TABS: 81.0000 mg | ORAL_TABLET | Freq: Every day | ORAL | 12 refills | Status: DC

## 2017-09-18 ENCOUNTER — Other Ambulatory Visit: Payer: Self-pay

## 2017-09-18 DIAGNOSIS — M5442 Lumbago with sciatica, left side: Principal | ICD-10-CM

## 2017-09-18 DIAGNOSIS — C3490 Malignant neoplasm of unspecified part of unspecified bronchus or lung: Secondary | ICD-10-CM

## 2017-09-18 DIAGNOSIS — M5441 Lumbago with sciatica, right side: Secondary | ICD-10-CM

## 2017-09-18 MED ORDER — ASPIRIN 81 MG PO TABS: 81.0000 mg | ORAL_TABLET | Freq: Every day | ORAL | 12 refills | Status: DC

## 2017-09-18 MED ORDER — LEVOTHYROXINE SODIUM 88 MCG PO TABS: 88.0000 ug | ORAL_TABLET | Freq: Every day | ORAL | 3 refills | Status: AC

## 2017-09-18 MED ORDER — LISINOPRIL 40 MG PO TABS: 40.0000 mg | ORAL_TABLET | Freq: Every day | ORAL | 3 refills | Status: AC

## 2017-09-18 MED ORDER — CLONAZEPAM 1 MG PO TABS: 1.0000 mg | ORAL_TABLET | Freq: Every day | ORAL | 0 refills | Status: DC

## 2017-09-18 MED ORDER — TRAMADOL HCL 50 MG PO TABS
50.0000 mg | ORAL_TABLET | Freq: Four times a day (QID) | ORAL | 0 refills | Status: DC | PRN
Start: 2017-09-18 — End: 2017-11-12

## 2017-09-18 MED ORDER — AMLODIPINE BESYLATE 5 MG PO TABS: 10.0000 mg | ORAL_TABLET | Freq: Every day | ORAL | 1 refills | Status: AC

## 2017-09-18 MED ORDER — DORZOLAMIDE HCL-TIMOLOL MAL 2-0.5 % OP SOLN: 2.0000 [drp] | Freq: Two times a day (BID) | OPHTHALMIC | 5 refills | Status: AC

## 2017-09-18 NOTE — Telephone Encounter (Signed)
Rx sent electronically.  Meds ordered this encounter  Medications  . amLODipine (NORVASC) 5 MG tablet    Sig: Take 2 tablets (10 mg total) by mouth daily. Per [name], NP    Dispense:  90 tablet    Refill:  1  . clonazePAM (KLONOPIN) 1 MG tablet    Sig: Take 1 tablet (1 mg total) by mouth at bedtime.    Dispense:  30 tablet    Refill:  0  . dorzolamide-timolol (COSOPT) 22.3-6.8 MG/ML ophthalmic solution    Sig: Place 2 drops into both eyes 2 (two) times daily. Per [name], NP    Dispense:  10 mL    Refill:  5  . traMADol (ULTRAM) 50 MG tablet    Sig: Take 1 tablet (50 mg total) by mouth every 6 (six) hours as needed for severe pain.    Dispense:  90 tablet    Refill:  0

## 2017-09-18 NOTE — Telephone Encounter (Signed)
Cindy with [facility] called:  Needs meds refilled with [facility] Refilled Lisinopril, Levothyroxine, ASA 81.  Remaining sent to [facility] to approve: Amlodipine, Cosopt, klonapin, Tramadol

## 2017-09-19 ENCOUNTER — Encounter: Payer: Self-pay | Admitting: Neurology

## 2017-09-19 ENCOUNTER — Ambulatory Visit (INDEPENDENT_AMBULATORY_CARE_PROVIDER_SITE_OTHER): Payer: Medicare Other | Admitting: Neurology

## 2017-09-19 VITALS — BP 100/58 | HR 104 | Ht 65.0 in | Wt 125.0 lb

## 2017-09-19 DIAGNOSIS — R29898 Other symptoms and signs involving the musculoskeletal system: Secondary | ICD-10-CM

## 2017-09-19 DIAGNOSIS — C7949 Secondary malignant neoplasm of other parts of nervous system: Secondary | ICD-10-CM

## 2017-09-19 DIAGNOSIS — C7951 Secondary malignant neoplasm of bone: Secondary | ICD-10-CM

## 2017-09-19 NOTE — Progress Notes (Signed)
[facility] Clinic Note - Initial Visit   Date: 09/19/17  PAMLA PANGLE MRN: [id number] DOB: [birth year]   Dear Dr. Harrison Mons, PA-C:  Thank you for your kind referral of [name] for consultation of generalized weakness. Although her history is well known to you, please allow [place] to reiterate it for the purpose of our medical record. The patient was accompanied to the clinic by caregiver who also provides collateral information.     History of Present Illness: [name] is a 76 y.o. 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    She was diagnosed with lung cancer in 2010 and initially underwent chemotherapy with carboplatin and paclitaxel, followed by by avastin, Tarceva, and then Nivolumab starting in May 2017.  She was closely followed by Dr. Julien Nordmann, her oncologist for this.  She developed chemotherapy-induced neuropathy and has numbness in the lower legs, feet, and hands.   Starting in July 2018, she began having weakness and pain the legs which started abruptly on 7/27, when she was going to see her PCP. She recalls having achy pain in the legs a few weeks prior to this and was treating this with tylenol.  On the day, she went to see her PCP, her legs buckled when she was at her mailbox to check pain and was assisted to stand by a bystander.  She was being treated for her pain and underwent imaging of the lumbar region in September which showed new metastatic disease at L3, L5, and L1 with tumor expansion of the L3 vertebral body and epidural space, cusing severe L3 neural impingement.  She also has disc protrusion at L4-5 affecting the left L4 nerve.  She was hospitalized in October after suffering a fall at home and started emergently on palliative radiotherapy.  She underwent 10  sessions of radiation therapy, which has significant helped with her leg and back pain, but she continues to have leg weakness.  She was discharged to [facility]) where she currently resides, but expresses much interest in being able to return home to be with her dog. She also was recently diagnosed with right leg DVT and was told no weight bearing, so now his mostly confined to a wheelchair.  She is able to stand with a walker, but needs help with transfers.  She denies any bowel or bladder incontinence.  She is very knowledgeable about her cancer treatment and options.     Out-side paper records, electronic medical record, and images have been reviewed where available and summarized as:  MRI lumbar spine wwo contrast 07/10/2017: 1. L3, L5 and S1 metastasis with tumoral expansion of L3 vertebral body. Mild chronic RIGHT L3 wedging. 2. L3 tumor expansion to epidural space resulting in severe canal stenosis at L3 and, severe neural foraminal narrowing L3-4. L3 nerve impingement. 3. Large LEFT L4-5 disc extrusion resulting in severe LEFT L4-5 neural foraminal narrowing and exited LEFT L4 nerve impingement. 4. Degenerative change of the lumbar spine.  No malalignment.   Past Medical History:  Diagnosis Date  . Bilateral lung cancer ([place]) 03/08/2011   recurrent  . Bone cancer ([place])    lung ca with spine mets  . Chronic fatigue 02/14/2016  . Dysuria 03/04/2017  . Encounter for antineoplastic immunotherapy 02/14/2016  . Foot pain 09/02/2011  . Glaucoma   . Hip pain 09/02/2011  .  Hypercholesterolemia   . Hypertension   . Hypertension 10/29/2016  . Hypothyroidism   . Lung cancer ([place]) 03/08/2011   recurrent    Past Surgical History:  Procedure Laterality Date  . LUNG LOBECTOMY  09/28/2009   right upper  . THORACOTOMY  09/28/2009   mini  . VIDEO ASSISTED THORACOSCOPY  09/28/2009     Medications:  Outpatient Encounter Medications as of 09/19/2017  Medication Sig Note  .  acetaminophen (TYLENOL) 325 MG tablet Take 650 mg by mouth every 6 (six) hours as needed for moderate pain.    Marland Kitchen amLODipine (NORVASC) 5 MG tablet Take 2 tablets (10 mg total) by mouth daily. Per [name], NP   . aspirin 81 MG tablet Take 1 tablet (81 mg total) by mouth daily.   . clonazePAM (KLONOPIN) 1 MG tablet Take 1 tablet (1 mg total) by mouth at bedtime.   . cyanocobalamin (,VITAMIN B-12,) 1000 MCG/ML injection Inject into the muscle.   Marland Kitchen dexamethasone (DECADRON) 2 MG tablet    . dorzolamide-timolol (COSOPT) 22.3-6.8 MG/ML ophthalmic solution Place 2 drops into both eyes 2 (two) times daily. Per [name], NP   . enoxaparin (LOVENOX) 100 MG/ML injection Inject 100 mg into the skin.   . feeding supplement, ENSURE ENLIVE, (ENSURE ENLIVE) LIQD Take 237 mLs by mouth 2 (two) times daily between meals.   . fluticasone (FLONASE) 50 MCG/ACT nasal spray Place 1 spray into both nostrils daily as needed for allergies or rhinitis. 06/10/2017: As needed  . guaiFENesin-codeine (ROBAFEN AC) 100-10 MG/5ML syrup Take 5 mLs by mouth 3 (three) times daily as needed for cough.   . levothyroxine (SYNTHROID, LEVOTHROID) 88 MCG tablet Take 1 tablet (88 mcg total) by mouth daily before breakfast.   . lidocaine-prilocaine (EMLA) cream Apply 1 application topically as needed (prior to port access).   Marland Kitchen lisinopril (PRINIVIL,ZESTRIL) 40 MG tablet Take 1 tablet (40 mg total) by mouth daily.   . Loperamide HCl (LOPERAMIDE A-D PO) Take by mouth.   . meloxicam (MOBIC) 7.5 MG tablet TAKE 1 TABLET BY MOUTH EVERY DAY   . mirtazapine (REMERON) 7.5 MG tablet Take 1 tablet (7.5 mg total) by mouth at bedtime.   Marland Kitchen PREMARIN vaginal cream INSERT BLUEBERRY SIZE AMOUNT OF ESTROGEN CREAM INTO VAGINA TWICE WEEKLY.   . traMADol (ULTRAM) 50 MG tablet Take 1 tablet (50 mg total) by mouth every 6 (six) hours as needed for severe pain.   . [DISCONTINUED] cyanocobalamin (,VITAMIN B-12,) 1000 MCG/ML injection Inject 1,000 mcg into the  muscle every 30 (thirty) days.    No facility-administered encounter medications on file as of 09/19/2017.      Allergies:  Allergies  Allergen Reactions  . Cymbalta [Duloxetine Hcl] Other (See Comments)    Involuntary movements " turned me completely around"  . Fish Allergy Diarrhea  . Amitriptyline     Initial reaction about 25 years ago.  . Codeine Nausea And Vomiting    vomiting  . Mirabegron Hypertension  . Solifenacin Other (See Comments)    Due to glaucoma  . Sulfa Antibiotics Nausea And Vomiting    Family History: Family History  Problem Relation Age of Onset  . Stroke Father   . Heart disease Father 34       MI  . Cancer Brother        lung  . Cancer Cousin        unknown    Social History: Social History   Tobacco Use  . Smoking status:  Former Smoker    Packs/day: 1.00    Years: 50.00    Pack years: 50.00    Types: Cigarettes    Last attempt to quit: 09/28/2009    Years since quitting: 7.9  . Smokeless tobacco: Never Used  Substance Use Topics  . Alcohol use: No  . Drug use: No   Social History   Social History Narrative   Lives at [facility]. Daughters live in [place] and [place], [place].  Retired.  Education: 2 years of college.     Review of Systems:  CONSTITUTIONAL: No fevers, chills, night sweats, +weight loss.   EYES: No visual changes or eye pain ENT: No hearing changes.  No history of nose bleeds.   RESPIRATORY: No cough, wheezing and shortness of breath.   CARDIOVASCULAR: Negative for chest pain, and palpitations.   GI: Negative for abdominal discomfort, blood in stools or black stools.  No recent change in bowel habits.   GU:  No history of incontinence.   MUSCLOSKELETAL: No history of joint pain or swelling.  No myalgias.   SKIN: Negative for lesions, rash, and itching.   HEMATOLOGY/ONCOLOGY: Negative for prolonged bleeding, bruising easily, and swollen nodes.  +history of cancer.   ENDOCRINE: Negative for cold or heat  intolerance, polydipsia or goiter.   PSYCH:  No depression or anxiety symptoms.   NEURO: As Above.   Vital Signs:  BP (!) 100/58   Pulse (!) 104   Ht 5\' 5"  (1.651 m)   Wt 125 lb (56.7 kg)   SpO2 98%   BMI 20.80 kg/m    General Medical Exam:   General:  Frail and elderly appearing, comfortable sitting in wheelchair.   Eyes/ENT: see cranial nerve examination.   Neck: No masses appreciated.  Full range of motion without tenderness.  No carotid bruits. Respiratory:  Clear to auscultation, reduced breath sounds Cardiac:  Regular rate and rhythm, no murmur.   Extremities:  1+ pitting edema in RLE.  Dry skin, severe onchymycosis  Neurological Exam: MENTAL STATUS including orientation to time, place, person, recent and remote memory, attention span and concentration, language, and fund of knowledge is normal.  Speech is not dysarthric.  CRANIAL NERVES: II:  No visual field defects.     III-IV-VI: Pupils equal round and reactive to light.  Normal conjugate, extra-ocular eye movements in all directions of gaze.  No nystagmus.  No ptosis.   V:  Normal facial sensation.    VII:  Normal facial symmetry and movements.  No pathologic facial reflexes.  VIII:  Normal hearing and vestibular function.   IX-X:  Normal palatal movement.   XI:  Normal shoulder shrug and head rotation.   XII:  Normal tongue strength and range of motion, no deviation or fasciculation.  MOTOR:  Generalized loss of muscle bulk, bilateral quadriceps muscle atrophy.   No pronator drift.  Tone is normal.    Right Upper Extremity:    Left Upper Extremity:    Deltoid  5/5   Deltoid  5/5   Biceps  5/5   Biceps  5/5   Triceps  5/5   Triceps  5/5   Wrist extensors  5/5   Wrist extensors  5/5   Wrist flexors  5/5   Wrist flexors  5/5   Finger extensors  5/5   Finger extensors  5/5   Finger flexors  5/5   Finger flexors  5/5   Dorsal interossei  5/5   Dorsal interossei  5/5  Abductor pollicis  5/5   Abductor pollicis  5/5    Tone (Ashworth scale)  0  Tone (Ashworth scale)  0   Right Lower Extremity:    Left Lower Extremity:    Hip flexors  4/5   Hip flexors  4-/5   Hip extensors  5/5   Hip extensors  5/5   Knee flexors  5/5   Knee flexors  5/5   Knee extensors  5/5   Knee extensors  5/5   Dorsiflexors  4/5   Dorsiflexors  4/5   Plantarflexors  5/5   Plantarflexors  5/5   Toe extensors  4/5   Toe extensors  4/5   Toe flexors  5/5   Toe flexors  5/5   Tone (Ashworth scale)  0  Tone (Ashworth scale)  0   MSRs:  Right                                                                 Left brachioradialis 2+  brachioradialis 2+  biceps 2+  biceps 2+  triceps 2+  triceps 2+  patellar 0  Patellar 0  ankle jerk 0  ankle jerk 0  Hoffman no  Hoffman no  plantar response down  plantar response down   SENSORY:  Vibration is intact in the hands, reduced to 20% at the knees, and absent at the ankles bilaterally.  Temperature is reduced in a gradient manner distal to knees.  Pin prick is intact.  COORDINATION/GAIT: Normal finger-to- nose-finger.  Intact rapid alternating movements bilaterally. Gait was not tested as patient is unable to weight bear on the RLE (due to DVT).   IMPRESSION: Ms. Newgent a 76 year-old female with recurrent lung cancer complicated by chemotherapy-induced neuropathy and recently found to have metastasis to the lumbar spine and epidural space (L3).  I have personally viewed the images with patient which shows severe spinal cord impingement at L3 as well as biforaminal impingement at L3 and L4 nerve root due to disc protrusion.  I explained that her leg weakness is due to a combination of metastatic disease to the spine as well as degenerative changes and disc protrusion in the lumbar region.  She completed palliative radiation and has resolution of pain, but no improvement in weakness.  With the severity of her neural impingement, she most likely will have residual weakness which she understands.  I  have encouraged her to do chair exercises such as leg lifts to stay conditioned and when able to bear weight on the right leg, start physical therapy.  Fall precautions discussed, especially as she is high risk for falls and on anticoagulation therapy.  She is very well informed about her cancer treatment, management options, and prognosis.  Due to her overall deconditioning and weakness, chemotherapy is on hold for now.  She is undecided as to how aggressive she wants to be in management of her cancer and will address goals of care with Dr. Julien Nordmann at her follow-up in January.  All questions were answered.  The duration of this appointment visit was 40 minutes of face-to-face time with the patient.  Greater than 50% of this time was spent in counseling, explanation of diagnosis, planning of further management, and coordination of care.   Thank you  for allowing me to participate in patient's care.  If I can answer any additional questions, I would be pleased to do so.    Sincerely,    Kalyan Barabas K. Shaleta Ruacho, DO  

## 2017-09-25 ENCOUNTER — Telehealth: Payer: Self-pay | Admitting: Physician Assistant

## 2017-09-25 NOTE — Telephone Encounter (Signed)
Copied from [place]. Topic: Quick Communication - See Telephone Encounter >> Sep 25, 2017  3:54 PM Oneta Rack wrote: CRM for notification. See Telephone encounter for:   09/25/17.  Caller name: Estill Bamberg  Relation to pt: RN from [facility]  Call back number: [phone]    Reason for call:  Verbal orders for skilled nursing 1x 1

## 2017-09-26 ENCOUNTER — Telehealth: Payer: Self-pay

## 2017-09-26 NOTE — Telephone Encounter (Signed)
[name] OT with [facility] home health called for PT evaluation order. VO given. Dr Julien Nordmann had cancelled PT for 3 weeks for anticoagualation d/t DVT. This time frame is completed.

## 2017-09-30 NOTE — Telephone Encounter (Signed)
Left message to get verbal ordfer

## 2017-09-30 NOTE — Telephone Encounter (Signed)
Pramob w/ [facility] - [phone] called in to request an extent ion for PT w/pt.    Frequency: 2 times for 4 weeks.   He said that pt is experiencing some stiffness in her ankle because she isnt mobile.    Please advise.

## 2017-10-01 NOTE — Telephone Encounter (Signed)
Verbal order given  

## 2017-10-01 NOTE — Telephone Encounter (Signed)
Copied from [place] [phone]. Topic: Inquiry >> Sep 30, 2017  1:27 PM Scherrie Gerlach wrote: Reason for CRM: Jenny Reichmann with the [facility] calling to follow up on Assisted living papers they faxed 12/12.   Advised cindy to please refax and she said she is going to do right now.  However she states this is urgent to get these back.

## 2017-10-03 ENCOUNTER — Other Ambulatory Visit: Payer: Self-pay | Admitting: Medical Oncology

## 2017-10-03 ENCOUNTER — Telehealth: Payer: Self-pay | Admitting: Radiation Oncology

## 2017-10-03 ENCOUNTER — Telehealth: Payer: Self-pay | Admitting: Medical Oncology

## 2017-10-03 NOTE — Telephone Encounter (Signed)
Needs lovenox order. Transferred call to [facility]

## 2017-10-03 NOTE — Telephone Encounter (Signed)
Received two urgent voicemails from [facility]. One from [place] and the other from [place]. Phoned back promptly and spoke with [facility]. She reports the patient is completely out of lovenox and if a new script isn't received by 4 pm at [place] the patient will have to go without the blood thinner all weekend. Per Freeman Caldron, PA-C notes Dr. Julien Nordmann was to make further recommendations or dose adjustments when he evaluated her on December 4. Documentation from today notes where Diane, RN for Dr. Julien Nordmann transferred call to this RN. Obtained verbal order from Shona Simpson, PA-C for same dose and administration with one refill. Phoned Tammy at [facility]. Verbalized the following script to her: Lovenox 1.5 mg/kg (80mg ) to be given daily via injection into the skin, qty 30 and 1 refill. Tammy repeated order correctly.

## 2017-10-03 NOTE — Telephone Encounter (Signed)
Phone call to [facility], spoke with [place]. Per Harrison Mons, discussed if it's more appropriate to have diet, medication, and standing orders faxed to Oncology vs primary - patient has not been seen since cancer diagnosis. Cindy agreeable. Closing note.   Provider, Juluis Rainier.

## 2017-10-09 ENCOUNTER — Telehealth: Payer: Self-pay | Admitting: Physician Assistant

## 2017-10-09 NOTE — Telephone Encounter (Signed)
Okay to give verbal or should be sent to oncology?? Please advice. Thanks!

## 2017-10-09 NOTE — Telephone Encounter (Unsigned)
Copied from [place]. Topic: Quick Communication - See Telephone Encounter >> Oct 09, 2017  9:53 AM Boyd Kerbs wrote: CRM for notification. See Telephone encounter for:  Nicolette from [place] [phone] Verbal Orders for PT 2 x 4 weeks: Asking if any restrictions due on physical Therapy due to DVT.  10/09/17.

## 2017-10-10 NOTE — Telephone Encounter (Signed)
Error-Sign Encounter

## 2017-10-11 NOTE — Telephone Encounter (Signed)
OK to give my verbal order, but would be better from oncology, as I have not seen the patient since September.

## 2017-10-13 NOTE — Telephone Encounter (Signed)
Left message with Nicolette at [place] to give verbal order.

## 2017-10-15 ENCOUNTER — Ambulatory Visit: Payer: Medicare Other | Admitting: Physician Assistant

## 2017-10-15 ENCOUNTER — Encounter: Payer: Self-pay | Admitting: Physician Assistant

## 2017-10-15 ENCOUNTER — Other Ambulatory Visit: Payer: Self-pay

## 2017-10-15 VITALS — BP 100/50 | HR 103 | Temp 98.0°F | Resp 20 | Ht 65.0 in

## 2017-10-15 DIAGNOSIS — C3491 Malignant neoplasm of unspecified part of right bronchus or lung: Secondary | ICD-10-CM | POA: Diagnosis not present

## 2017-10-15 DIAGNOSIS — R05 Cough: Secondary | ICD-10-CM

## 2017-10-15 DIAGNOSIS — M6281 Muscle weakness (generalized): Secondary | ICD-10-CM

## 2017-10-15 DIAGNOSIS — C7951 Secondary malignant neoplasm of bone: Secondary | ICD-10-CM

## 2017-10-15 DIAGNOSIS — M5137 Other intervertebral disc degeneration, lumbosacral region: Secondary | ICD-10-CM | POA: Diagnosis not present

## 2017-10-15 DIAGNOSIS — C3492 Malignant neoplasm of unspecified part of left bronchus or lung: Secondary | ICD-10-CM | POA: Diagnosis not present

## 2017-10-15 DIAGNOSIS — I82401 Acute embolism and thrombosis of unspecified deep veins of right lower extremity: Secondary | ICD-10-CM | POA: Diagnosis not present

## 2017-10-15 DIAGNOSIS — R059 Cough, unspecified: Secondary | ICD-10-CM

## 2017-10-15 MED ORDER — BENZONATATE 100 MG PO CAPS: 100.0000 mg | ORAL_CAPSULE | Freq: Three times a day (TID) | ORAL | 0 refills | Status: AC | PRN

## 2017-10-15 MED ORDER — GUAIFENESIN ER 1200 MG PO TB12
1.0000 | ORAL_TABLET | Freq: Two times a day (BID) | ORAL | 1 refills | Status: AC | PRN
Start: 2017-10-15 — End: ?

## 2017-10-15 MED ORDER — AMOXICILLIN-POT CLAVULANATE 875-125 MG PO TABS: 1.0000 | ORAL_TABLET | Freq: Two times a day (BID) | ORAL | 0 refills | Status: AC

## 2017-10-15 NOTE — Progress Notes (Signed)
Patient ID: [name], female    DOB: 02-06-1941, 77 y.o.   MRN: [id number]  PCP: Harrison Mons, PA-C  Chief Complaint  Patient presents with  . Cough    x2 weeks, pt states she coughing up mucous and it is yellow and thick. Pt reports not much appetite. Pt states she isn't having any chest pressure or fullness. Pt has been taking Robitussion and Tylenol. Pt brings in paperwork to take back to [facility].    Subjective:   Presents for evaluation of cough. She is accompanied by a tech from the SNF where she is living.   I last saw this patient in September, with continued leg pain and weakness, with increasing frequency of falls. 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 She underwent palliative radiation therapy and is considering treatment options for her lung cancer going forward. The course has been complicated by a RIGHT LE DVT. Is currently living at [facility].  She has seen neurology regarding the leg weakness, and has resumed PT. Unfortunately, the person coming to work with her was a smoker and that aggravated the patient's breathing. She is concerned that her request for an alternate therapist resulted in cancellation of the sessions.  Cough began about 2 weeks ago and is productive of thick yellow sputum. No chest pain. SOB is not worse than her baseline. She has intermittent runny nose, mild sinus pressure. No ear pain or sore throat, no fever or chills. No nausea or vomiting. Has loose stool and some fecal incontinence that is not new.  Bored. No friends. "Theyare all older than me." Didn't get to vote this year. "It's like they've all left this world behind." Doesn't like puzzles. Reads a little. Wants to return home, with her dog, who is currently living with a friend in [place].     Review of Systems As above.    Patient Active Problem List     Diagnosis Date Noted  . DVT (deep venous thrombosis) ([place]) 09/03/2017  . Acute bilateral low back pain with bilateral sciatica   . Hypothyroidism   . Dehydration   . AKI (acute kidney injury) ([place])   . UTI (urinary tract infection) 07/15/2017  . 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 Muscle weakness (generalized) 06/06/2017  . Multiple falls 06/06/2017  . DDD (degenerative disc disease), lumbosacral 05/15/2017  . Incontinence of feces 04/23/2017  . Vaginal atrophy 04/23/2017  . Voiding dysfunction 04/23/2017  . Recurrent UTI 04/23/2017  . Dysuria 03/04/2017  . Hypertension 10/29/2016  . Encounter for antineoplastic immunotherapy 02/14/2016  . Chronic fatigue 02/14/2016  . Port catheter in place 02/09/2016  . Urine frequency 07/06/2014  . Diarrhea 05/24/2014  . Nausea and vomiting 05/24/2014  . Sepsis ([place]) 05/24/2014  . Hypothermia 05/24/2014  . Hypokalemia 05/24/2014  . Nausea vomiting and diarrhea 05/24/2014  . Proteinuria 04/28/2012  . Vitamin B12 deficiency 03/17/2012  . Unspecified deficiency anemia 12/02/2011  . 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 Multinodular goiter 11/29/2011  . Numbness 11/29/2011  . Acute pulmonary embolism ([place]) 11/04/2011  . Acute pulmonary embolism ([place]) 11/04/2011  . Hip pain 09/02/2011  . Foot pain 09/02/2011  . Bilateral lung cancer ([place]) 03/08/2011     Prior to Admission medications   Medication Sig Start Date End Date Taking? Authorizing Provider  acetaminophen (TYLENOL) 325 MG tablet Take 650 mg by mouth every 6 (six)  hours as needed for moderate pain.    Yes [provider]  amLODipine (NORVASC) 5 MG tablet Take 2 tablets (10 mg total) by mouth daily. Per [name], NP 09/18/17  Yes Harrison Mons, PA-C  aspirin 81 MG tablet Take 1 tablet (81 mg total) by mouth daily. 09/18/17  Yes Jireh Vinas, PA-C  clonazePAM (KLONOPIN) 1 MG tablet Take 1 tablet (1 mg total) by mouth at bedtime. 09/18/17  Yes  Aleaya Latona, PA-C  cyanocobalamin (,VITAMIN B-12,) 1000 MCG/ML injection Inject into the muscle.   Yes [provider]  dexamethasone (DECADRON) 2 MG tablet  08/07/17  Yes [provider]  dorzolamide-timolol (COSOPT) 22.3-6.8 MG/ML ophthalmic solution Place 2 drops into both eyes 2 (two) times daily. Per [name], NP 09/18/17  Yes Jacqulynn Cadet, Margorie Renner, PA-C  enoxaparin (LOVENOX) 80 MG/0.8ML injection Inject 80 mg into the skin.   Yes [provider]  feeding supplement, ENSURE ENLIVE, (ENSURE ENLIVE) LIQD Take 237 mLs by mouth 2 (two) times daily between meals. 07/18/17  Yes Eugenie Filler, MD  fluticasone [facility]) 50 MCG/ACT nasal spray Place 1 spray into both nostrils daily as needed for allergies or rhinitis.   Yes [provider]  guaiFENesin-codeine (ROBAFEN AC) 100-10 MG/5ML syrup Take 5 mLs by mouth 3 (three) times daily as needed for cough.   Yes [provider]  levothyroxine (SYNTHROID, LEVOTHROID) 88 MCG tablet Take 1 tablet (88 mcg total) by mouth daily before breakfast. 09/18/17  Yes Awanda Wilcock, PA-C  lidocaine-prilocaine (EMLA) cream Apply 1 application topically as needed (prior to port access). 03/05/16  Yes Curt Bears, MD  lisinopril (PRINIVIL,ZESTRIL) 40 MG tablet Take 1 tablet (40 mg total) by mouth daily. 09/18/17  Yes Jonesha Tsuchiya, PA-C  Loperamide HCl (LOPERAMIDE A-D PO) Take by mouth.   Yes [provider]  meloxicam (MOBIC) 7.5 MG tablet TAKE 1 TABLET BY MOUTH EVERY DAY 06/09/17  Yes Sultana Tierney, PA-C  mirtazapine (REMERON) 7.5 MG tablet Take 1 tablet (7.5 mg total) by mouth at bedtime. 07/04/17  Yes Alma Mohiuddin, PA-C  traMADol (ULTRAM) 50 MG tablet Take 1 tablet (50 mg total) by mouth every 6 (six) hours as needed for severe pain. 09/18/17  Yes Zacharie Portner, PA-C  PREMARIN vaginal cream INSERT BLUEBERRY SIZE AMOUNT OF ESTROGEN CREAM INTO VAGINA TWICE WEEKLY. 04/25/17   [provider]      Allergies  Allergen Reactions  . Cymbalta [Duloxetine Hcl] Other (See Comments)    Involuntary movements " turned me completely around"  . Fish Allergy Diarrhea  . Amitriptyline     Initial reaction about 25 years ago.  . Codeine Nausea And Vomiting    vomiting  . Mirabegron Hypertension  . Solifenacin Other (See Comments)    Due to glaucoma  . Sulfa Antibiotics Nausea And Vomiting       Objective:  Physical Exam  Constitutional: She is oriented to person, place, and time. She appears well-developed and well-nourished. She is active and cooperative. No distress.  BP (!) 100/50 (BP Location: Left Arm, Patient Position: Sitting, Cuff Size: Normal)   Pulse (!) 103   Temp 98 F (36.7 C) (Oral)   Resp 20   Ht 5\' 5"  (1.651 m)   SpO2 96%   BMI 20.80 kg/m   HENT:  Head: Normocephalic and atraumatic.  Right Ear: Hearing normal.  Left Ear: Hearing normal.  Eyes: Conjunctivae are normal. No scleral icterus.  Neck: Normal range of motion. Neck supple. No thyromegaly present.  Cardiovascular: Normal rate, regular rhythm and normal heart sounds.  Pulses:      Radial pulses are 2+ on the right side, and 2+ on the left side.       Dorsalis pedis pulses are 2+ on the right side, and 2+ on the left side.       Posterior tibial pulses are 2+ on the right side, and 2+ on the left side.  RIGHT lower extremity edema, 1+  Pulmonary/Chest: Effort normal and breath sounds normal.  Lymphadenopathy:       Head (right side): No tonsillar, no preauricular, no posterior auricular and no occipital adenopathy present.       Head (left side): No tonsillar, no preauricular, no posterior auricular and no occipital adenopathy present.    She has no cervical adenopathy.       Right: No supraclavicular adenopathy present.       Left: No supraclavicular adenopathy present.  Neurological: She is alert and oriented to person, place, and time. No sensory deficit.  Skin: Skin is warm, dry and intact. No  rash noted. No cyanosis or erythema. Nails show no clubbing.  Psychiatric: She has a normal mood and affect. Her speech is normal and behavior is normal.       Assessment & Plan:   1. Cough Given her other conditions, elect to cover for possible subacute sinusitis. Lungs are remarkably clear on auscultation today. - amoxicillin-clavulanate (AUGMENTIN) 875-125 MG tablet; Take 1 tablet by mouth 2 (two) times daily for 10 days.  Dispense: 20 tablet; Refill: 0 - benzonatate (TESSALON) 100 MG capsule; Take 1-2 capsules (100-200 mg total) by mouth 3 (three) times daily as needed for cough.  Dispense: 40 capsule; Refill: 0 - Guaifenesin (MUCINEX MAXIMUM STRENGTH) 1200 MG TB12; Take 1 tablet (1,200 mg total) by mouth every 12 (twelve) hours as needed.  Dispense: 14 tablet; Refill: 1  2. Bilateral lung cancer ([place]) 3. Acute deep vein thrombosis (DVT) of right lower extremity, unspecified vein (HCC) 4. 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 DDD (degenerative disc disease), lumbosacral 6. Muscle weakness (generalized) Continue treatment and follow-up per specialty care. Ask that she resume PT with a therapist/tech who does not smoke.   Return if symptoms worsen or fail to improve.   Fara Chute, PA-C [facility]

## 2017-10-15 NOTE — Patient Instructions (Signed)
     IF you received an x-ray today, you will receive an invoice from [facility]. Please contact [facility] at [phone] with questions or concerns regarding your invoice.   IF you received labwork today, you will receive an invoice from [facility]. Please contact [facility] at [phone] with questions or concerns regarding your invoice.   Our billing staff will not be able to assist you with questions regarding bills from these companies.  You will be contacted with the lab results as soon as they are available. The fastest way to get your results is to activate your My Chart account. Instructions are located on the last page of this paperwork. If you have not heard from us regarding the results in 2 weeks, please contact this office.     

## 2017-10-21 ENCOUNTER — Inpatient Hospital Stay: Payer: Medicare Other | Attending: Internal Medicine | Admitting: Internal Medicine

## 2017-10-21 ENCOUNTER — Encounter: Payer: Self-pay | Admitting: Internal Medicine

## 2017-10-21 ENCOUNTER — Telehealth: Payer: Self-pay | Admitting: Internal Medicine

## 2017-10-21 ENCOUNTER — Inpatient Hospital Stay: Payer: Medicare Other

## 2017-10-21 DIAGNOSIS — I10 Essential (primary) hypertension: Secondary | ICD-10-CM | POA: Diagnosis not present

## 2017-10-21 DIAGNOSIS — Z86718 Personal history of other venous thrombosis and embolism: Secondary | ICD-10-CM | POA: Insufficient documentation

## 2017-10-21 DIAGNOSIS — C7951 Secondary malignant neoplasm of bone: Secondary | ICD-10-CM | POA: Insufficient documentation

## 2017-10-21 DIAGNOSIS — R634 Abnormal weight loss: Secondary | ICD-10-CM | POA: Insufficient documentation

## 2017-10-21 DIAGNOSIS — C3411 Malignant neoplasm of upper lobe, right bronchus or lung: Secondary | ICD-10-CM | POA: Insufficient documentation

## 2017-10-21 DIAGNOSIS — Z7901 Long term (current) use of anticoagulants: Secondary | ICD-10-CM | POA: Diagnosis not present

## 2017-10-21 DIAGNOSIS — R530 Neoplastic (malignant) related fatigue: Secondary | ICD-10-CM | POA: Diagnosis not present

## 2017-10-21 DIAGNOSIS — Z79899 Other long term (current) drug therapy: Secondary | ICD-10-CM

## 2017-10-21 DIAGNOSIS — C3490 Malignant neoplasm of unspecified part of unspecified bronchus or lung: Secondary | ICD-10-CM

## 2017-10-21 DIAGNOSIS — R531 Weakness: Secondary | ICD-10-CM | POA: Insufficient documentation

## 2017-10-21 DIAGNOSIS — Z923 Personal history of irradiation: Secondary | ICD-10-CM | POA: Insufficient documentation

## 2017-10-21 DIAGNOSIS — C3491 Malignant neoplasm of unspecified part of right bronchus or lung: Secondary | ICD-10-CM

## 2017-10-21 DIAGNOSIS — C3492 Malignant neoplasm of unspecified part of left bronchus or lung: Principal | ICD-10-CM

## 2017-10-21 LAB — CBC WITH DIFFERENTIAL/PLATELET
Abs Granulocyte: 10.4 10*3/uL — ABNORMAL HIGH (ref 1.5–6.5)
Basophils Absolute: 0.1 10*3/uL (ref 0.0–0.1)
Basophils Relative: 1 %
EOS ABS: 0.1 10*3/uL (ref 0.0–0.5)
EOS PCT: 1 %
HCT: 34.7 % — ABNORMAL LOW (ref 34.8–46.6)
HEMOGLOBIN: 11.2 g/dL — AB (ref 11.6–15.9)
LYMPHS ABS: 0.7 10*3/uL — AB (ref 0.9–3.3)
Lymphocytes Relative: 6 %
MCH: 27.8 pg (ref 25.1–34.0)
MCHC: 32.3 g/dL (ref 31.5–36.0)
MCV: 85.8 fL (ref 79.5–101.0)
MONO ABS: 1 10*3/uL — AB (ref 0.1–0.9)
MONOS PCT: 8 %
NEUTROS ABS: 10.4 10*3/uL — AB (ref 1.5–6.5)
Neutrophils Relative %: 84 %
Platelets: 278 10*3/uL (ref 145–400)
RBC: 4.04 MIL/uL (ref 3.70–5.45)
RDW: 17.7 % — ABNORMAL HIGH (ref 11.2–16.1)
WBC: 12.3 10*3/uL — ABNORMAL HIGH (ref 3.9–10.3)

## 2017-10-21 LAB — COMPREHENSIVE METABOLIC PANEL
ALT: 10 U/L (ref 0–55)
ANION GAP: 8 (ref 3–11)
AST: 9 U/L (ref 5–34)
Albumin: 2.9 g/dL — ABNORMAL LOW (ref 3.5–5.0)
Alkaline Phosphatase: 113 U/L (ref 40–150)
BILIRUBIN TOTAL: 0.3 mg/dL (ref 0.2–1.2)
BUN: 5 mg/dL — ABNORMAL LOW (ref 7–26)
CO2: 27 mmol/L (ref 22–29)
Calcium: 9.3 mg/dL (ref 8.4–10.4)
Chloride: 104 mmol/L (ref 98–109)
Creatinine, Ser: 0.83 mg/dL (ref 0.60–1.10)
GFR calc non Af Amer: >60 mL/min (ref >60)
Glucose, Bld: 113 mg/dL (ref 70–140)
Potassium: 3.2 mmol/L — ABNORMAL LOW (ref 3.3–4.7)
SODIUM: 139 mmol/L (ref 136–145)
TOTAL PROTEIN: 6.5 g/dL (ref 6.4–8.3)

## 2017-10-21 NOTE — Progress Notes (Signed)
[place] [phone]   [phone]  OFFICE PROGRESS NOTE  Harrison Mons, PA-C [address] [place] [address]  DIAGNOSIS:  1) recurrent non-small cell lung cancer, adenocarcinoma initially diagnosed as stage IB (T2a, N0, M0) in December 2010. 2) history of acute pulmonary embolism in the right lower lobe lobe are, segmental and subsegmental pulmonary arteries diagnosed in January 2013.  PRIOR THERAPY: 1.Status post right upper lobectomy on September 28, 2009 under the care of Dr. Arlyce Dice. The patient refused adjuvant chemotherapy at that time. She had disease recurrence in June of 2012.  2.Status post 4 cycles of systemic chemotherapy with carboplatin for an AUC of 6 and paclitaxel at 200 mg per meter squared and Avastin at 15 mg/kg according to the ECOG protocol #5508.  3. Chemotherapy with Avastin 15 mg/kg according to the ECOG protocol #5508 status post 32 cycles. Last cycle was given on 06/30/2013 discontinued secondary to disease progression and persistent proteinuria. 4. Tarceva 150 mg by mouth daily, started 08/09/2013, status post 10 months of treatment. 5. Tarceva 100 mg by mouth daily started 07/02/2014, status post 19 months, discontinued secondary to disease progression. 6.  Palliative radiotherapy to the lumbosacral spine under the care of Dr. Tammi Klippel completed July 29, 2017.  CURRENT THERAPY: Immunotherapy with Nivolumab 240 mg IV every 2 weeks status post 34 cycles. First dose was given 02/20/2016.  INTERVAL HISTORY: [name] 77 y.o. female returns to the clinic today for follow-up visit.  The patient continues to have increasing fatigue and weakness.  She also has a swelling of the right lower extremity and she is currently on Lovenox subcutaneously.  She has been unable to do any physical therapy for the last several weeks because of the diagnosis of the deep venous thrombosis.  The patient is not happy with her  current living environment at the skilled nursing facility.  She would love to go back home and she mentioned that her neighbors provide good care for her.  She understands that her daughter is busy and she may not be able to see her frequently.  She denied having any recent chest pain, shortness breath but has mild cough with no hemoptysis.  She was treated for bronchitis recently.  The patient also lost a few pounds since her last visit.  She is here today for reevaluation and recommendation regarding her condition.  MEDICAL HISTORY: Past Medical History:  Diagnosis Date  . Bilateral lung cancer ([place]) 03/08/2011   recurrent  . Bone cancer ([place])    lung ca with spine mets  . Chronic fatigue 02/14/2016  . Dysuria 03/04/2017  . Encounter for antineoplastic immunotherapy 02/14/2016  . Foot pain 09/02/2011  . Glaucoma   . Hip pain 09/02/2011  . Hypercholesterolemia   . Hypertension   . Hypertension 10/29/2016  . Hypothyroidism   . Lung cancer ([place]) 03/08/2011   recurrent    ALLERGIES:  is allergic to cymbalta [duloxetine hcl]; fish allergy; amitriptyline; codeine; mirabegron; solifenacin; and sulfa antibiotics.  MEDICATIONS:  Current Outpatient Medications  Medication Sig Dispense Refill  . acetaminophen (TYLENOL) 325 MG tablet Take 650 mg by mouth every 6 (six) hours as needed for moderate pain.     Marland Kitchen amLODipine (NORVASC) 5 MG tablet Take 2 tablets (10 mg total) by mouth daily. Per [name], NP 90 tablet 1  . amoxicillin-clavulanate (AUGMENTIN) 875-125 MG tablet Take 1 tablet by mouth 2 (two) times daily for 10 days. 20 tablet 0  .  aspirin 81 MG tablet Take 1 tablet (81 mg total) by mouth daily. 30 tablet 12  . benzonatate (TESSALON) 100 MG capsule Take 1-2 capsules (100-200 mg total) by mouth 3 (three) times daily as needed for cough. 40 capsule 0  . clonazePAM (KLONOPIN) 1 MG tablet Take 1 tablet (1 mg total) by mouth at bedtime. 30 tablet 0  . cyanocobalamin (,VITAMIN B-12,) 1000  MCG/ML injection Inject into the muscle.    Marland Kitchen dexamethasone (DECADRON) 2 MG tablet     . dorzolamide-timolol (COSOPT) 22.3-6.8 MG/ML ophthalmic solution Place 2 drops into both eyes 2 (two) times daily. Per [name], NP 10 mL 5  . enoxaparin (LOVENOX) 80 MG/0.8ML injection Inject 80 mg into the skin.    . feeding supplement, ENSURE ENLIVE, (ENSURE ENLIVE) LIQD Take 237 mLs by mouth 2 (two) times daily between meals. 237 mL 12  . fluticasone (FLONASE) 50 MCG/ACT nasal spray Place 1 spray into both nostrils daily as needed for allergies or rhinitis.    . Guaifenesin (MUCINEX MAXIMUM STRENGTH) 1200 MG TB12 Take 1 tablet (1,200 mg total) by mouth every 12 (twelve) hours as needed. 14 tablet 1  . guaiFENesin-codeine (ROBAFEN AC) 100-10 MG/5ML syrup Take 5 mLs by mouth 3 (three) times daily as needed for cough.    . levothyroxine (SYNTHROID, LEVOTHROID) 88 MCG tablet Take 1 tablet (88 mcg total) by mouth daily before breakfast. 90 tablet 3  . lidocaine-prilocaine (EMLA) cream Apply 1 application topically as needed (prior to port access). 30 g 0  . lisinopril (PRINIVIL,ZESTRIL) 40 MG tablet Take 1 tablet (40 mg total) by mouth daily. 90 tablet 3  . Loperamide HCl (LOPERAMIDE A-D PO) Take by mouth.    . meloxicam (MOBIC) 7.5 MG tablet TAKE 1 TABLET BY MOUTH EVERY DAY 30 tablet 0  . mirtazapine (REMERON) 7.5 MG tablet Take 1 tablet (7.5 mg total) by mouth at bedtime. 30 tablet 2  . PREMARIN vaginal cream INSERT BLUEBERRY SIZE AMOUNT OF ESTROGEN CREAM INTO VAGINA TWICE WEEKLY.  1  . traMADol (ULTRAM) 50 MG tablet Take 1 tablet (50 mg total) by mouth every 6 (six) hours as needed for severe pain. 90 tablet 0   No current facility-administered medications for this visit.     SURGICAL HISTORY:  Past Surgical History:  Procedure Laterality Date  . LUNG LOBECTOMY  09/28/2009   right upper  . THORACOTOMY  09/28/2009   mini  . VIDEO ASSISTED THORACOSCOPY  09/28/2009    REVIEW OF SYSTEMS:  A  comprehensive review of systems was negative except for: Constitutional: positive for fatigue and weight loss Respiratory: positive for cough Musculoskeletal: positive for arthralgias and muscle weakness   PHYSICAL EXAMINATION: General appearance: alert, cooperative, fatigued and no distress Head: Normocephalic, without obvious abnormality, atraumatic Neck: no adenopathy, no JVD, supple, symmetrical, trachea midline and thyroid not enlarged, symmetric, no tenderness/mass/nodules Lymph nodes: Cervical, supraclavicular, and axillary nodes normal. Resp: clear to auscultation bilaterally Back: symmetric, no curvature. ROM normal. No CVA tenderness. Cardio: regular rate and rhythm, S1, S2 normal, no murmur, click, rub or gallop GI: soft, non-tender; bowel sounds normal; no masses,  no organomegaly Extremities: extremities normal, atraumatic, no cyanosis or edema  ECOG PERFORMANCE STATUS: 2 - Symptomatic, <50% confined to bed  Blood pressure (!) 123/54, pulse 96, temperature 98.2 F (36.8 C), temperature source Oral, resp. rate 18, height 5\' 5"  (1.651 m), weight 129 lb 11.2 oz (58.8 kg), SpO2 94 %.  LABORATORY DATA: Lab Results  Component Value Date  WBC 12.3 (H) 10/21/2017   HGB 11.2 (L) 10/21/2017   HCT 34.7 (L) 10/21/2017   MCV 85.8 10/21/2017   PLT 278 10/21/2017      Chemistry      Component Value Date/Time   NA 137 09/16/2017 1003   K 3.8 09/16/2017 1003   CL 107 07/17/2017 0901   CL 107 03/30/2013 1036   CO2 24 09/16/2017 1003   BUN 12.5 09/16/2017 1003   CREATININE 0.8 09/16/2017 1003      Component Value Date/Time   CALCIUM 9.5 09/16/2017 1003   ALKPHOS 134 09/16/2017 1003   AST 16 09/16/2017 1003   ALT 29 09/16/2017 1003   BILITOT 0.31 09/16/2017 1003       RADIOGRAPHIC STUDIES: No results found.  ASSESSMENT AND PLAN:  This is a very pleasant 77 years old African-American female with recurrent non-small cell lung cancer, adenocarcinoma status post several  chemotherapy regimens and currently on treatment with immunotherapy with Nivolumab status post 34 cycles. The patient has been tolerating this treatment well but unfortunately she had evidence of metastatic disease in the lower lumbar and sacral spines with epidural extension. She underwent palliative radiotherapy to the lumbosacral spines under the care of Dr. Tammi Klippel. The patient is still a resident at the skilled nursing facility and she is not undergoing any physical therapy at this point because of the previous history of deep venous thrombosis.  This was diagnosed more than 6 weeks ago. She is currently on Lovenox. I recommended for the patient to continue on observation for now.  I will arrange for her to have repeat CT scan of the chest, abdomen and pelvis in 1 month for evaluation of her condition. For the deep venous thrombosis she will continue her current treatment with Lovenox. I also recommended for the patient to resume her physical therapy at the facility. She was also encouraged to increase her oral intake and to drink Ensure supplements. Her goal is to go back home at some point in the near future but I think it is currently unsafe for her to go back home. I will see her back for follow-up visit in 1 month for reevaluation. She was advised to call immediately if she has any other concerning symptoms in the interval. The patient voices understanding of current disease status and treatment options and is in agreement with the current care plan. All questions were answered. The patient knows to call the clinic with any problems, questions or concerns. We can certainly see the patient much sooner if necessary.  Disclaimer: This note was dictated with voice recognition software. Similar sounding words can inadvertently be transcribed and may not be corrected upon review.

## 2017-10-21 NOTE — Telephone Encounter (Signed)
Scheduled appt per 1/8 los - Gave patient AVS and calender per los. - central radiology to contact patient with ct schedule.

## 2017-11-06 ENCOUNTER — Encounter (HOSPITAL_COMMUNITY): Payer: Self-pay | Admitting: Emergency Medicine

## 2017-11-06 ENCOUNTER — Other Ambulatory Visit: Payer: Self-pay

## 2017-11-06 ENCOUNTER — Emergency Department (HOSPITAL_COMMUNITY): Payer: Medicare Other

## 2017-11-06 ENCOUNTER — Inpatient Hospital Stay (HOSPITAL_COMMUNITY)
Admission: EM | Admit: 2017-11-06 | Discharge: 2017-11-12 | DRG: 640 | Disposition: A | Discharge status: Skilled Nursing Facility | Payer: Medicare Other | Attending: Internal Medicine | Admitting: Internal Medicine

## 2017-11-06 DIAGNOSIS — A0472 Enterocolitis due to Clostridium difficile, not specified as recurrent: Secondary | ICD-10-CM | POA: Diagnosis not present

## 2017-11-06 DIAGNOSIS — L8990 Pressure ulcer of unspecified site, unspecified stage: Secondary | ICD-10-CM

## 2017-11-06 DIAGNOSIS — E860 Dehydration: Secondary | ICD-10-CM | POA: Diagnosis not present

## 2017-11-06 DIAGNOSIS — L89151 Pressure ulcer of sacral region, stage 1: Secondary | ICD-10-CM | POA: Diagnosis present

## 2017-11-06 DIAGNOSIS — L89109 Pressure ulcer of unspecified part of back, unspecified stage: Secondary | ICD-10-CM | POA: Diagnosis not present

## 2017-11-06 DIAGNOSIS — D649 Anemia, unspecified: Secondary | ICD-10-CM

## 2017-11-06 DIAGNOSIS — B370 Candidal stomatitis: Secondary | ICD-10-CM | POA: Diagnosis present

## 2017-11-06 DIAGNOSIS — E876 Hypokalemia: Secondary | ICD-10-CM | POA: Diagnosis present

## 2017-11-06 DIAGNOSIS — I10 Essential (primary) hypertension: Secondary | ICD-10-CM

## 2017-11-06 DIAGNOSIS — Z6822 Body mass index (BMI) 22.0-22.9, adult: Secondary | ICD-10-CM | POA: Diagnosis not present

## 2017-11-06 DIAGNOSIS — Z87891 Personal history of nicotine dependence: Secondary | ICD-10-CM | POA: Diagnosis not present

## 2017-11-06 DIAGNOSIS — E7800 Pure hypercholesterolemia, unspecified: Secondary | ICD-10-CM | POA: Diagnosis present

## 2017-11-06 DIAGNOSIS — C7951 Secondary malignant neoplasm of bone: Secondary | ICD-10-CM | POA: Diagnosis not present

## 2017-11-06 DIAGNOSIS — R531 Weakness: Secondary | ICD-10-CM | POA: Diagnosis not present

## 2017-11-06 DIAGNOSIS — Z7189 Other specified counseling: Secondary | ICD-10-CM | POA: Diagnosis not present

## 2017-11-06 DIAGNOSIS — C3491 Malignant neoplasm of unspecified part of right bronchus or lung: Secondary | ICD-10-CM

## 2017-11-06 DIAGNOSIS — Z86718 Personal history of other venous thrombosis and embolism: Secondary | ICD-10-CM

## 2017-11-06 DIAGNOSIS — D539 Nutritional anemia, unspecified: Secondary | ICD-10-CM | POA: Diagnosis not present

## 2017-11-06 DIAGNOSIS — H409 Unspecified glaucoma: Secondary | ICD-10-CM | POA: Diagnosis present

## 2017-11-06 DIAGNOSIS — I82401 Acute embolism and thrombosis of unspecified deep veins of right lower extremity: Secondary | ICD-10-CM | POA: Diagnosis not present

## 2017-11-06 DIAGNOSIS — I82409 Acute embolism and thrombosis of unspecified deep veins of unspecified lower extremity: Secondary | ICD-10-CM | POA: Diagnosis present

## 2017-11-06 DIAGNOSIS — R05 Cough: Secondary | ICD-10-CM

## 2017-11-06 DIAGNOSIS — M5441 Lumbago with sciatica, right side: Secondary | ICD-10-CM

## 2017-11-06 DIAGNOSIS — D638 Anemia in other chronic diseases classified elsewhere: Secondary | ICD-10-CM | POA: Diagnosis present

## 2017-11-06 DIAGNOSIS — R059 Cough, unspecified: Secondary | ICD-10-CM

## 2017-11-06 DIAGNOSIS — Z7982 Long term (current) use of aspirin: Secondary | ICD-10-CM

## 2017-11-06 DIAGNOSIS — J209 Acute bronchitis, unspecified: Secondary | ICD-10-CM | POA: Diagnosis present

## 2017-11-06 DIAGNOSIS — Z86711 Personal history of pulmonary embolism: Secondary | ICD-10-CM

## 2017-11-06 DIAGNOSIS — E039 Hypothyroidism, unspecified: Secondary | ICD-10-CM | POA: Diagnosis not present

## 2017-11-06 DIAGNOSIS — C3492 Malignant neoplasm of unspecified part of left bronchus or lung: Secondary | ICD-10-CM | POA: Diagnosis not present

## 2017-11-06 DIAGNOSIS — Z8249 Family history of ischemic heart disease and other diseases of the circulatory system: Secondary | ICD-10-CM

## 2017-11-06 DIAGNOSIS — Z515 Encounter for palliative care: Secondary | ICD-10-CM | POA: Diagnosis not present

## 2017-11-06 DIAGNOSIS — E440 Moderate protein-calorie malnutrition: Secondary | ICD-10-CM | POA: Diagnosis present

## 2017-11-06 DIAGNOSIS — A09 Infectious gastroenteritis and colitis, unspecified: Secondary | ICD-10-CM

## 2017-11-06 DIAGNOSIS — Z79890 Hormone replacement therapy (postmenopausal): Secondary | ICD-10-CM

## 2017-11-06 DIAGNOSIS — J189 Pneumonia, unspecified organism: Secondary | ICD-10-CM | POA: Diagnosis present

## 2017-11-06 DIAGNOSIS — Z85118 Personal history of other malignant neoplasm of bronchus and lung: Secondary | ICD-10-CM

## 2017-11-06 DIAGNOSIS — M5442 Lumbago with sciatica, left side: Secondary | ICD-10-CM

## 2017-11-06 LAB — RETICULOCYTES
RBC.: 4.03 MIL/uL (ref 3.87–5.11)
RETIC CT PCT: 2.6 % (ref 0.4–3.1)
Retic Count, Absolute: 104.8 10*3/uL (ref 19.0–186.0)

## 2017-11-06 LAB — BASIC METABOLIC PANEL
Anion gap: 8 (ref 5–15)
BUN: 9 mg/dL (ref 6–20)
CHLORIDE: 109 mmol/L (ref 101–111)
CO2: 24 mmol/L (ref 22–32)
CREATININE: 0.7 mg/dL (ref 0.44–1.00)
Calcium: 8.2 mg/dL — ABNORMAL LOW (ref 8.9–10.3)
GFR calc Af Amer: >60 mL/min (ref >60)
GFR calc non Af Amer: >60 mL/min (ref >60)
Glucose, Bld: 88 mg/dL (ref 65–99)
Potassium: 2.9 mmol/L — ABNORMAL LOW (ref 3.5–5.1)
Sodium: 141 mmol/L (ref 135–145)

## 2017-11-06 LAB — HEPATIC FUNCTION PANEL
ALBUMIN: 2.7 g/dL — AB (ref 3.5–5.0)
ALT: 6 U/L — ABNORMAL LOW (ref 14–54)
AST: 11 U/L — AB (ref 15–41)
Alkaline Phosphatase: 102 U/L (ref 38–126)
BILIRUBIN TOTAL: 0.3 mg/dL (ref 0.3–1.2)
Bilirubin, Direct: <0.1 mg/dL — ABNORMAL LOW (ref 0.1–0.5)
Total Protein: 5.4 g/dL — ABNORMAL LOW (ref 6.5–8.1)

## 2017-11-06 LAB — URINALYSIS, ROUTINE W REFLEX MICROSCOPIC
Bilirubin Urine: NEGATIVE
GLUCOSE, UA: NEGATIVE mg/dL
Hgb urine dipstick: NEGATIVE
Ketones, ur: NEGATIVE mg/dL
Nitrite: NEGATIVE
PROTEIN: NEGATIVE mg/dL
SPECIFIC GRAVITY, URINE: 1.026 (ref 1.005–1.030)
pH: 6 (ref 5.0–8.0)

## 2017-11-06 LAB — VITAMIN B12: VITAMIN B 12: 654 pg/mL (ref 180–914)

## 2017-11-06 LAB — CBC
HCT: 29.3 % — ABNORMAL LOW (ref 36.0–46.0)
Hemoglobin: 9.1 g/dL — ABNORMAL LOW (ref 12.0–15.0)
MCH: 26.8 pg (ref 26.0–34.0)
MCHC: 31.1 g/dL (ref 30.0–36.0)
MCV: 86.2 fL (ref 78.0–100.0)
PLATELETS: 261 10*3/uL (ref 150–400)
RBC: 3.4 MIL/uL — AB (ref 3.87–5.11)
RDW: 18.7 % — AB (ref 11.5–15.5)
WBC: 14.1 10*3/uL — ABNORMAL HIGH (ref 4.0–10.5)

## 2017-11-06 LAB — FERRITIN: Ferritin: 259 ng/mL (ref 11–307)

## 2017-11-06 LAB — MAGNESIUM
Magnesium: 2 mg/dL (ref 1.7–2.4)
Magnesium: 1.9 mg/dL (ref 1.7–2.4)

## 2017-11-06 LAB — CBG MONITORING, ED: Glucose-Capillary: 88 mg/dL (ref 65–99)

## 2017-11-06 LAB — INFLUENZA PANEL BY PCR (TYPE A & B)
Influenza A By PCR: NEGATIVE
Influenza B By PCR: NEGATIVE

## 2017-11-06 LAB — IRON AND TIBC
Iron: 27 ug/dL — ABNORMAL LOW (ref 28–170)
SATURATION RATIOS: 16 % (ref 10.4–31.8)
TIBC: 174 ug/dL — ABNORMAL LOW (ref 250–450)
UIBC: 147 ug/dL

## 2017-11-06 LAB — FOLATE: FOLATE: 14.9 ng/mL (ref >5.9)

## 2017-11-06 LAB — TSH: TSH: 4.495 u[IU]/mL (ref 0.350–4.500)

## 2017-11-06 LAB — PHOSPHORUS: PHOSPHORUS: 2.3 mg/dL — AB (ref 2.5–4.6)

## 2017-11-06 MED ORDER — SODIUM CHLORIDE 0.9 % IV BOLUS (SEPSIS)
500.0000 mL | Freq: Once | INTRAVENOUS | Status: AC
  Administered 2017-11-06: 500 mL via INTRAVENOUS

## 2017-11-06 MED ORDER — GUAIFENESIN-CODEINE 100-10 MG/5ML PO SOLN
10.0000 mL | Freq: Four times a day (QID) | ORAL | Status: DC | PRN
  Administered 2017-11-09 – 2017-11-11 (×3): 10 mL via ORAL
  Filled 2017-11-06 (×3): qty 10

## 2017-11-06 MED ORDER — ACETAMINOPHEN 650 MG RE SUPP: 650.0000 mg | Freq: Four times a day (QID) | RECTAL | Status: DC | PRN

## 2017-11-06 MED ORDER — IPRATROPIUM BROMIDE 0.02 % IN SOLN: 0.5000 mg | RESPIRATORY_TRACT | Status: DC | PRN

## 2017-11-06 MED ORDER — TRAMADOL HCL 50 MG PO TABS
50.0000 mg | ORAL_TABLET | Freq: Four times a day (QID) | ORAL | Status: DC | PRN
  Administered 2017-11-10: 50 mg via ORAL
  Filled 2017-11-06: qty 1

## 2017-11-06 MED ORDER — IPRATROPIUM BROMIDE 0.02 % IN SOLN
0.5000 mg | Freq: Three times a day (TID) | RESPIRATORY_TRACT | Status: DC
  Administered 2017-11-07 – 2017-11-08 (×2): 0.5 mg via RESPIRATORY_TRACT
  Filled 2017-11-06 (×3): qty 2.5

## 2017-11-06 MED ORDER — LEVOTHYROXINE SODIUM 88 MCG PO TABS
88.0000 ug | ORAL_TABLET | Freq: Every day | ORAL | Status: DC
  Administered 2017-11-08 – 2017-11-12 (×5): 88 ug via ORAL
  Filled 2017-11-06 (×6): qty 1

## 2017-11-06 MED ORDER — ENOXAPARIN SODIUM 100 MG/ML ~~LOC~~ SOLN
90.0000 mg | SUBCUTANEOUS | Status: DC
  Administered 2017-11-06 – 2017-11-11 (×6): 90 mg via SUBCUTANEOUS
  Filled 2017-11-06 (×6): qty 1

## 2017-11-06 MED ORDER — ACETAMINOPHEN 325 MG PO TABS
650.0000 mg | ORAL_TABLET | Freq: Four times a day (QID) | ORAL | Status: DC | PRN
  Administered 2017-11-07: 650 mg via ORAL
  Filled 2017-11-06: qty 2

## 2017-11-06 MED ORDER — ENSURE ENLIVE PO LIQD
237.0000 mL | Freq: Two times a day (BID) | ORAL | Status: DC
  Administered 2017-11-07 – 2017-11-11 (×6): 237 mL via ORAL

## 2017-11-06 MED ORDER — HYDROCODONE-HOMATROPINE 5-1.5 MG/5ML PO SYRP
5.0000 mL | ORAL_SOLUTION | Freq: Three times a day (TID) | ORAL | Status: DC
  Administered 2017-11-06 – 2017-11-12 (×17): 5 mL via ORAL
  Filled 2017-11-06 (×17): qty 5

## 2017-11-06 MED ORDER — SODIUM CHLORIDE 0.9 % IV SOLN
INTRAVENOUS | Status: DC
  Administered 2017-11-06 – 2017-11-07 (×2): via INTRAVENOUS

## 2017-11-06 MED ORDER — LEVALBUTEROL HCL 0.63 MG/3ML IN NEBU
0.6300 mg | INHALATION_SOLUTION | RESPIRATORY_TRACT | Status: DC | PRN
  Administered 2017-11-12: 0.63 mg via RESPIRATORY_TRACT

## 2017-11-06 MED ORDER — GUAIFENESIN-CODEINE 100-10 MG/5ML PO SYRP
10.0000 mL | ORAL_SOLUTION | Freq: Four times a day (QID) | ORAL | Status: DC | PRN
Start: 2017-11-06 — End: 2017-11-06

## 2017-11-06 MED ORDER — CYANOCOBALAMIN 1000 MCG/ML IJ SOLN
1000.0000 ug | INTRAMUSCULAR | Status: DC
  Administered 2017-11-07: 1000 ug via INTRAMUSCULAR
  Filled 2017-11-06: qty 1

## 2017-11-06 MED ORDER — ONDANSETRON HCL 4 MG PO TABS: 4.0000 mg | ORAL_TABLET | Freq: Four times a day (QID) | ORAL | Status: DC | PRN

## 2017-11-06 MED ORDER — IPRATROPIUM BROMIDE 0.02 % IN SOLN
0.5000 mg | Freq: Four times a day (QID) | RESPIRATORY_TRACT | Status: DC
  Administered 2017-11-06: 0.5 mg via RESPIRATORY_TRACT
  Filled 2017-11-06: qty 2.5

## 2017-11-06 MED ORDER — VANCOMYCIN 50 MG/ML ORAL SOLUTION
125.0000 mg | Freq: Four times a day (QID) | ORAL | Status: DC
  Administered 2017-11-06 – 2017-11-12 (×21): 125 mg via ORAL
  Filled 2017-11-06 (×23): qty 2.5

## 2017-11-06 MED ORDER — FLUTICASONE PROPIONATE 50 MCG/ACT NA SUSP: 1.0000 | Freq: Every day | NASAL | Status: DC | PRN

## 2017-11-06 MED ORDER — ACETAMINOPHEN 500 MG PO TABS: 500.0000 mg | ORAL_TABLET | ORAL | Status: DC | PRN

## 2017-11-06 MED ORDER — POTASSIUM CHLORIDE CRYS ER 20 MEQ PO TBCR
40.0000 meq | EXTENDED_RELEASE_TABLET | ORAL | Status: AC
  Administered 2017-11-06 – 2017-11-07 (×3): 40 meq via ORAL
  Filled 2017-11-06 (×3): qty 2

## 2017-11-06 MED ORDER — SODIUM CHLORIDE 0.9% FLUSH
3.0000 mL | Freq: Two times a day (BID) | INTRAVENOUS | Status: DC
  Administered 2017-11-06 – 2017-11-11 (×6): 3 mL via INTRAVENOUS

## 2017-11-06 MED ORDER — ONDANSETRON HCL 4 MG/2ML IJ SOLN: 4.0000 mg | Freq: Four times a day (QID) | INTRAMUSCULAR | Status: DC | PRN

## 2017-11-06 MED ORDER — LISINOPRIL 20 MG PO TABS
40.0000 mg | ORAL_TABLET | Freq: Every day | ORAL | Status: DC
  Administered 2017-11-07 – 2017-11-12 (×6): 40 mg via ORAL
  Filled 2017-11-06 (×7): qty 2

## 2017-11-06 MED ORDER — LEVALBUTEROL HCL 0.63 MG/3ML IN NEBU
0.6300 mg | INHALATION_SOLUTION | Freq: Four times a day (QID) | RESPIRATORY_TRACT | Status: DC
  Administered 2017-11-06: 0.63 mg via RESPIRATORY_TRACT
  Filled 2017-11-06: qty 3

## 2017-11-06 MED ORDER — AMLODIPINE BESYLATE 10 MG PO TABS
10.0000 mg | ORAL_TABLET | Freq: Every day | ORAL | Status: DC
  Administered 2017-11-07 – 2017-11-12 (×6): 10 mg via ORAL
  Filled 2017-11-06 (×6): qty 1

## 2017-11-06 MED ORDER — LIDOCAINE-PRILOCAINE 2.5-2.5 % EX CREA: 1.0000 "application " | TOPICAL_CREAM | CUTANEOUS | Status: DC | PRN

## 2017-11-06 MED ORDER — ALUM & MAG HYDROXIDE-SIMETH 200-200-20 MG/5ML PO SUSP: 30.0000 mL | Freq: Four times a day (QID) | ORAL | Status: DC | PRN

## 2017-11-06 MED ORDER — CLONAZEPAM 1 MG PO TABS
1.0000 mg | ORAL_TABLET | Freq: Every day | ORAL | Status: DC
  Administered 2017-11-06 – 2017-11-11 (×6): 1 mg via ORAL
  Filled 2017-11-06 (×4): qty 1
  Filled 2017-11-06: qty 2
  Filled 2017-11-06: qty 1

## 2017-11-06 MED ORDER — DORZOLAMIDE HCL-TIMOLOL MAL 2-0.5 % OP SOLN
2.0000 [drp] | Freq: Two times a day (BID) | OPHTHALMIC | Status: DC
  Administered 2017-11-06 – 2017-11-12 (×10): 2 [drp] via OPHTHALMIC
  Filled 2017-11-06: qty 10

## 2017-11-06 MED ORDER — SODIUM CHLORIDE 0.9 % IV BOLUS (SEPSIS)
1000.0000 mL | Freq: Once | INTRAVENOUS | Status: AC
  Administered 2017-11-06: 1000 mL via INTRAVENOUS

## 2017-11-06 MED ORDER — BUDESONIDE 0.5 MG/2ML IN SUSP
0.5000 mg | Freq: Two times a day (BID) | RESPIRATORY_TRACT | Status: DC
  Administered 2017-11-06 – 2017-11-12 (×9): 0.5 mg via RESPIRATORY_TRACT
  Filled 2017-11-06 (×13): qty 2

## 2017-11-06 MED ORDER — LEVALBUTEROL HCL 0.63 MG/3ML IN NEBU
0.6300 mg | INHALATION_SOLUTION | Freq: Three times a day (TID) | RESPIRATORY_TRACT | Status: DC
  Administered 2017-11-07 – 2017-11-08 (×2): 0.63 mg via RESPIRATORY_TRACT
  Filled 2017-11-06 (×3): qty 3

## 2017-11-06 NOTE — ED Notes (Signed)
ED TO INPATIENT HANDOFF REPORT  Name/Age/Gender [name] 77 y.o. female  Code Status Code Status History    Date Active Date Inactive Code Status Order ID Comments User Context   07/15/2017 18:25 07/17/2017 22:04 Full Code [id number]  Oswald Hillock, MD Inpatient   05/24/2014 05:53 05/26/2014 17:14 Full Code [id number]  Karie Kirks, DO Inpatient    Advance Directive Documentation     Most Recent Value  Type of Advance Directive  Healthcare Power of Attorney, Living will  Pre-existing out of facility DNR order (yellow form or pink MOST form)  No data  "MOST" Form in Place?  No data      Home/SNF/Other Nursing Home  Chief Complaint weakness  Level of Care/Admitting Diagnosis ED Disposition    ED Disposition Condition [facility]: [place] [[id number]]  Level of Care: Med-Surg [16]  Diagnosis: Weakness [[id number]]  Admitting Physician: Eugenie Filler [place]  Attending Physician: Eugenie Filler [[id number]]  Estimated length of stay: past midnight tomorrow  Certification:: I certify this patient will need inpatient services for at least 2 midnights  Bed request comments: [place] preferably  PT Class (Do Not Modify): Inpatient [101]  PT Acc Code (Do Not Modify): Private [1]       Medical History Past Medical History:  Diagnosis Date  . Bilateral lung cancer ([place]) 03/08/2011   recurrent  . Bone cancer ([place])    lung ca with spine mets  . Chronic fatigue 02/14/2016  . Dysuria 03/04/2017  . Encounter for antineoplastic immunotherapy 02/14/2016  . Foot pain 09/02/2011  . Glaucoma   . Hip pain 09/02/2011  . Hypercholesterolemia   . Hypertension   . Hypertension 10/29/2016  . Hypothyroidism   . Lung cancer ([place]) 03/08/2011   recurrent    Allergies Allergies  Allergen Reactions  . Cymbalta [Duloxetine Hcl] Other (See Comments)    Involuntary movements " turned me completely around"  . Fish Allergy Diarrhea  . Amitriptyline      Initial reaction about 25 years ago.  . Codeine Nausea And Vomiting    vomiting  . Mirabegron Hypertension  . Solifenacin Other (See Comments)    Due to glaucoma  . Sulfa Antibiotics Nausea And Vomiting    IV Location/Drains/Wounds Patient Lines/Drains/Airways Status   Active Line/Drains/Airways    Name:   Placement date:   Placement time:   Site:   Days:   Implanted Port 04/30/11 Right Chest   04/30/11    -    Chest   2382          Labs/Imaging Results for orders placed or performed during the hospital encounter of 11/06/17 (from the past 48 hour(s))  Basic metabolic panel     Status: Abnormal   Collection Time: 11/06/17 11:44 AM  Result Value Ref Range   Sodium 141 135 - 145 mmol/L   Potassium 2.9 (L) 3.5 - 5.1 mmol/L   Chloride 109 101 - 111 mmol/L   CO2 24 22 - 32 mmol/L   Glucose, Bld 88 65 - 99 mg/dL   BUN 9 6 - 20 mg/dL   Creatinine, Ser 0.70 0.44 - 1.00 mg/dL   Calcium 8.2 (L) 8.9 - 10.3 mg/dL   GFR calc non Af Amer >60 >60 mL/min   GFR calc Af Amer >60 >60 mL/min    Comment: (NOTE) The eGFR has been calculated using the CKD EPI equation. This calculation has not been validated in all clinical  situations. eGFR's persistently <60 mL/min signify possible Chronic Kidney Disease.    Anion gap 8 5 - 15  CBC     Status: Abnormal   Collection Time: 11/06/17 11:44 AM  Result Value Ref Range   WBC 14.1 (H) 4.0 - 10.5 K/uL   RBC 3.40 (L) 3.87 - 5.11 MIL/uL   Hemoglobin 9.1 (L) 12.0 - 15.0 g/dL   HCT 29.3 (L) 36.0 - 46.0 %   MCV 86.2 78.0 - 100.0 fL   MCH 26.8 26.0 - 34.0 pg   MCHC 31.1 30.0 - 36.0 g/dL   RDW 18.7 (H) 11.5 - 15.5 %   Platelets 261 150 - 400 K/uL  Urinalysis, Routine w reflex microscopic     Status: Abnormal   Collection Time: 11/06/17 11:44 AM  Result Value Ref Range   Color, Urine AMBER (A) YELLOW    Comment: BIOCHEMICALS MAY BE AFFECTED BY COLOR   APPearance CLEAR CLEAR   Specific Gravity, Urine 1.026 1.005 - 1.030   pH 6.0 5.0 - 8.0    Glucose, UA NEGATIVE NEGATIVE mg/dL   Hgb urine dipstick NEGATIVE NEGATIVE   Bilirubin Urine NEGATIVE NEGATIVE   Ketones, ur NEGATIVE NEGATIVE mg/dL   Protein, ur NEGATIVE NEGATIVE mg/dL   Nitrite NEGATIVE NEGATIVE   Leukocytes, UA SMALL (A) NEGATIVE   RBC / HPF 0-5 0 - 5 RBC/hpf   WBC, UA 0-5 0 - 5 WBC/hpf   Bacteria, UA RARE (A) NONE SEEN   Squamous Epithelial / LPF 0-5 (A) NONE SEEN  Hepatic function panel     Status: Abnormal   Collection Time: 11/06/17 11:44 AM  Result Value Ref Range   Total Protein 5.4 (L) 6.5 - 8.1 g/dL   Albumin 2.7 (L) 3.5 - 5.0 g/dL   AST 11 (L) 15 - 41 U/L   ALT 6 (L) 14 - 54 U/L   Alkaline Phosphatase 102 38 - 126 U/L   Total Bilirubin 0.3 0.3 - 1.2 mg/dL   Bilirubin, Direct <0.1 (L) 0.1 - 0.5 mg/dL   Indirect Bilirubin NOT CALCULATED 0.3 - 0.9 mg/dL  CBG monitoring, ED     Status: None   Collection Time: 11/06/17 11:47 AM  Result Value Ref Range   Glucose-Capillary 88 65 - 99 mg/dL  Reticulocytes     Status: None   Collection Time: 11/06/17  4:21 PM  Result Value Ref Range   Retic Ct Pct 2.6 0.4 - 3.1 %   RBC. 4.03 3.87 - 5.11 MIL/uL   Retic Count, Absolute 104.8 19.0 - 186.0 K/uL   *Note: Due to a large number of results and/or encounters for the requested time period, some results have not been displayed. A complete set of results can be found in Results Review.   Dg Chest 2 View  Result Date: 11/06/2017 CLINICAL DATA:  Weakness, history of lung carcinoma with metastatic disease EXAM: CHEST  2 VIEW COMPARISON:  05/12/2017 CT of the chest FINDINGS: Cardiac shadow is stable. The lungs are again well aerated with some chronic changes and elevation of the right hemidiaphragm. Small right-sided pleural effusion is noted which appears new from the prior CT patchy densities are noted in the left lung which correspond to known lung lesions. No focal confluent infiltrate is seen. Small left pleural effusion is noted on the lateral film. IMPRESSION: Small  bilateral pleural effusions right greater than left. There may be some underlying infiltrative changes on the right. Stable vague densities corresponding to known left-sided parenchymal lesions. Electronically Signed  By: Inez Catalina M.D.   On: 11/06/2017 15:07   Dg Abd 2 Views  Result Date: 11/06/2017 CLINICAL DATA:  77 year old female with a history weakness EXAM: ABDOMEN - 2 VIEW COMPARISON:  05/25/2014 FINDINGS: Gas within stomach, small bowel, colon. No abnormal distention. Decubitus image demonstrates no significant air-fluid levels. No radiopaque foreign body. No unexpected calcification. No unexpected soft tissue density. Calcified fibroids. Multilevel degenerative changes of the spine. No acute displaced fracture. Vascular calcifications. IMPRESSION: Nonobstructive bowel gas pattern. Electronically Signed   By: Corrie Mckusick D.O.   On: 11/06/2017 15:01    Pending Labs Unresulted Labs (From admission, onward)   Start     Ordered   11/06/17 1711  Influenza panel by PCR (type A & B)  (Influenza PCR Panel)  Once,   R     11/06/17 1710   11/06/17 1626  C difficile quick scan w PCR reflex  (C Difficile quick screen w PCR reflex panel)  Once, for 48 hours,   R    Comments:  Laxatives (last 72 hours)   None     Question Answer Comment  Is your patient experiencing loose or watery stools (3 or more in 24 hours)? Yes   Has the patient received laxatives in the last 24 hours? No   Has a negative Cdiff test resulted in the last 7 days? No      11/06/17 1625   11/06/17 1622  Culture, blood (Routine X 2) w Reflex to ID Panel  BLOOD CULTURE X 2,   R     11/06/17 1621   11/06/17 1622  Culture, Urine  Once,   R     11/06/17 1621   11/06/17 1621  Vitamin B12  (Anemia Panel (PNL))  Once,   R     11/06/17 1620   11/06/17 1621  Folate  (Anemia Panel (PNL))  Once,   R     11/06/17 1620   11/06/17 1621  Iron and TIBC  (Anemia Panel (PNL))  Once,   R     11/06/17 1620   11/06/17 1621  Ferritin   (Anemia Panel (PNL))  Once,   R     11/06/17 1620   11/06/17 1621  Occult blood card to lab, stool  Once,   R     11/06/17 1620   11/06/17 1620  Magnesium  Add-on,   R     11/06/17 1619   Signed and Held  Magnesium  Once,   R     Signed and Held   Signed and Held  Phosphorus  Once,   R     Signed and Held   Signed and Held  TSH  Once,   R     Signed and Held   Signed and [employer], sputum-assessment  Once,   R     Signed and Held   Signed and Held  Comprehensive metabolic panel  Tomorrow morning,   R     Signed and Held   Visual merchandiser and Held  CBC  Tomorrow morning,   R     Signed and Held   Signed and Held  Protime-INR  Tomorrow morning,   R     Signed and Held      Vitals/Pain Today's Vitals   11/06/17 1527 11/06/17 1700 11/06/17 1730 11/06/17 1759  BP: 121/60 (!) 118/48 133/60 133/60  Pulse: 81 86 90 92  Resp: 15 (!) 33 (!) 23 (!) 25  Temp:  TempSrc:      SpO2: 95%   90%  PainSc:        Isolation Precautions Droplet precaution  Medications Medications  potassium chloride SA (K-DUR,KLOR-CON) CR tablet 40 mEq (40 mEq Oral Given 11/06/17 1639)  0.9 %  sodium chloride infusion (not administered)  HYDROcodone-homatropine (HYCODAN) 5-1.5 MG/5ML syrup 5 mL (not administered)  vancomycin (VANCOCIN) 50 mg/mL oral solution 125 mg (not administered)  levalbuterol (XOPENEX) nebulizer solution 0.63 mg (not administered)  ipratropium (ATROVENT) nebulizer solution 0.5 mg (not administered)  budesonide (PULMICORT) nebulizer solution 0.5 mg (not administered)  sodium chloride 0.9 % bolus 500 mL (0 mLs Intravenous Stopped 11/06/17 1449)  sodium chloride 0.9 % bolus 1,000 mL (1,000 mLs Intravenous New Bag/Given 11/06/17 1651)    Mobility non-ambulatory

## 2017-11-06 NOTE — ED Provider Notes (Signed)
[facility] DEPT Provider Note   CSN: [id number] Arrival date & time: 11/06/17  1100     History   Chief Complaint Chief Complaint  Patient presents with  . Weakness    HPI [name] is a 77 y.o. female.  HPI Patient presents with generalized weakness.  Has been going over the last few months.  Has history of metastatic lung cancer including spinal metastases.  Currently off treatment.  Also has been diagnosed with C. difficile around 10 days ago.  Has been on metronidazole for this.  Has continued to feel more weak.  Has had a decreased appetite.  Reported is not like the food at the nursing home she is at.  No chest pain.  States she is weak all over.  States it involves both the arms and legs but may be worse in the legs.  No headache.  No confusion.  Is scheduled for a new staging CT in around 3 weeks. Past Medical History:  Diagnosis Date  . Bilateral lung cancer ([place]) 03/08/2011   recurrent  . Bone cancer ([place])    lung ca with spine mets  . Chronic fatigue 02/14/2016  . Dysuria 03/04/2017  . Encounter for antineoplastic immunotherapy 02/14/2016  . Foot pain 09/02/2011  . Glaucoma   . Hip pain 09/02/2011  . Hypercholesterolemia   . Hypertension   . Hypertension 10/29/2016  . Hypothyroidism   . Lung cancer ([place]) 03/08/2011   recurrent    Patient Active Problem List   Diagnosis Date Noted  . DVT (deep venous thrombosis) ([place]) 09/03/2017  . Acute bilateral low back pain with bilateral sciatica   . Hypothyroidism   . Dehydration   . AKI (acute kidney injury) ([place])   . UTI (urinary tract infection) 07/15/2017  . 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 Muscle weakness (generalized) 06/06/2017  . Multiple falls 06/06/2017  . DDD (degenerative disc disease), lumbosacral 05/15/2017  . Incontinence of feces 04/23/2017  . Vaginal atrophy 04/23/2017  . Voiding dysfunction 04/23/2017  . Recurrent UTI 04/23/2017  . Dysuria  03/04/2017  . Hypertension 10/29/2016  . Encounter for antineoplastic immunotherapy 02/14/2016  . Chronic fatigue 02/14/2016  . Port catheter in place 02/09/2016  . Urine frequency 07/06/2014  . Diarrhea 05/24/2014  . Nausea and vomiting 05/24/2014  . Sepsis ([place]) 05/24/2014  . Hypothermia 05/24/2014  . Hypokalemia 05/24/2014  . Nausea vomiting and diarrhea 05/24/2014  . Proteinuria 04/28/2012  . Vitamin B12 deficiency 03/17/2012  . Unspecified deficiency anemia 12/02/2011  . 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 Multinodular goiter 11/29/2011  . Numbness 11/29/2011  . Acute pulmonary embolism ([place]) 11/04/2011  . Acute pulmonary embolism ([place]) 11/04/2011  . Hip pain 09/02/2011  . Foot pain 09/02/2011  . Bilateral lung cancer ([place]) 03/08/2011    Past Surgical History:  Procedure Laterality Date  . LUNG LOBECTOMY  09/28/2009   right upper  . THORACOTOMY  09/28/2009   mini  . VIDEO ASSISTED THORACOSCOPY  09/28/2009    OB History    No data available       Home Medications    Prior to Admission medications   Medication Sig Start Date End Date Taking? Authorizing Provider  acetaminophen (TYLENOL) 500 MG tablet Take 500 mg by mouth every 4 (four) hours as needed for moderate pain, fever or headache.    Yes [provider]  alum & mag hydroxide-simeth (Tiburones) 200-200-20 MG/5ML suspension Take 30 mLs by mouth every 6 (six) hours  as needed for indigestion or heartburn.   Yes [provider]  amLODipine (NORVASC) 5 MG tablet Take 2 tablets (10 mg total) by mouth daily. Per [name], NP 09/18/17  Yes Harrison Mons, PA-C  aspirin 81 MG tablet Take 1 tablet (81 mg total) by mouth daily. 09/18/17  Yes Jeffery, Chelle, PA-C  benzonatate (TESSALON) 100 MG capsule Take 1-2 capsules (100-200 mg total) by mouth 3 (three) times daily as needed for cough. Patient taking differently: Take 100 mg by mouth 3 (three) times daily as needed for cough.  10/15/17   Yes Jeffery, Chelle, PA-C  clonazePAM (KLONOPIN) 1 MG tablet Take 1 tablet (1 mg total) by mouth at bedtime. 09/18/17  Yes Jeffery, Chelle, PA-C  cyanocobalamin (,VITAMIN B-12,) 1000 MCG/ML injection Inject 1,000 mcg into the muscle every 30 (thirty) days.    Yes [provider]  dorzolamide-timolol (COSOPT) 22.3-6.8 MG/ML ophthalmic solution Place 2 drops into both eyes 2 (two) times daily. Per [name], NP 09/18/17  Yes Jacqulynn Cadet, Chelle, PA-C  enoxaparin (LOVENOX) 80 MG/0.8ML injection Inject 80 mg into the skin daily.    Yes [provider]  feeding supplement, ENSURE ENLIVE, (ENSURE ENLIVE) LIQD Take 237 mLs by mouth 2 (two) times daily between meals. 07/18/17  Yes Eugenie Filler, MD  fluticasone [facility]) 50 MCG/ACT nasal spray Place 1 spray into both nostrils daily as needed for allergies or rhinitis.   Yes [provider]  Guaifenesin (MUCINEX MAXIMUM STRENGTH) 1200 MG TB12 Take 1 tablet (1,200 mg total) by mouth every 12 (twelve) hours as needed. Patient taking differently: Take 1 tablet by mouth every 12 (twelve) hours as needed.  10/15/17  Yes Jeffery, Chelle, PA-C  guaiFENesin-codeine (ROBAFEN AC) 100-10 MG/5ML syrup Take 10 mLs by mouth 4 (four) times daily as needed for cough.    Yes [provider]  levothyroxine (SYNTHROID, LEVOTHROID) 88 MCG tablet Take 1 tablet (88 mcg total) by mouth daily before breakfast. 09/18/17  Yes Jeffery, Chelle, PA-C  lidocaine-prilocaine (EMLA) cream Apply 1 application topically as needed (prior to port access). 03/05/16  Yes Curt Bears, MD  lisinopril (PRINIVIL,ZESTRIL) 40 MG tablet Take 1 tablet (40 mg total) by mouth daily. 09/18/17  Yes Jeffery, Chelle, PA-C  Loperamide HCl (LOPERAMIDE A-D PO) Take 2 mg by mouth every 3 (three) hours as needed (for diarrhea).    Yes [provider]  magnesium hydroxide (MILK OF MAGNESIA) 400 MG/5ML suspension Take 30 mLs by mouth daily as needed for mild constipation.    Yes [provider]  metroNIDAZOLE (FLAGYL) 500 MG tablet Take 500 mg by mouth 3 (three) times daily. 10/29/17  Yes [provider]  traMADol (ULTRAM) 50 MG tablet Take 1 tablet (50 mg total) by mouth every 6 (six) hours as needed for severe pain. 09/18/17  Yes Jeffery, Chelle, PA-C  meloxicam (MOBIC) 7.5 MG tablet TAKE 1 TABLET BY MOUTH EVERY DAY Patient not taking: Reported on 11/06/2017 06/09/17   Harrison Mons, PA-C  mirtazapine (REMERON) 7.5 MG tablet Take 1 tablet (7.5 mg total) by mouth at bedtime. Patient not taking: Reported on 11/06/2017 07/04/17   Harrison Mons, PA-C    Family History Family History  Problem Relation Age of Onset  . Stroke Father   . Heart disease Father 81       MI  . Cancer Brother        lung  . Cancer Cousin        unknown    Social History Social  History   Tobacco Use  . Smoking status: Former Smoker    Packs/day: 1.00    Years: 50.00    Pack years: 50.00    Types: Cigarettes    Last attempt to quit: 09/28/2009    Years since quitting: 8.1  . Smokeless tobacco: Never Used  Substance Use Topics  . Alcohol use: No  . Drug use: No     Allergies   Cymbalta [duloxetine hcl]; Fish allergy; Amitriptyline; Codeine; Mirabegron; Solifenacin; and Sulfa antibiotics   Review of Systems Review of Systems  Constitutional: Positive for appetite change and fatigue. Negative for fever.  HENT: Negative for congestion.   Eyes: Negative for visual disturbance.  Respiratory: Positive for shortness of breath.   Cardiovascular: Negative for chest pain.  Gastrointestinal: Positive for diarrhea. Negative for abdominal distention and nausea.  Endocrine: Positive for polydipsia. Negative for polyphagia and polyuria.  Genitourinary: Negative for flank pain.  Musculoskeletal: Negative for back pain.  Skin: Negative for rash.  Neurological: Positive for weakness.  Hematological: Negative for adenopathy.  Psychiatric/Behavioral: Negative for  confusion.     Physical Exam Updated Vital Signs BP 121/60 (BP Location: Right Arm)   Pulse 81   Temp 98.9 F (37.2 C) (Oral)   Resp 15   SpO2 95%   Physical Exam  Constitutional: She appears well-developed.  HENT:  Head: Atraumatic.  Eyes: Pupils are equal, round, and reactive to light.  Cardiovascular: Normal rate.  Pulmonary/Chest: Effort normal.  Abdominal: There is no tenderness.  Musculoskeletal: She exhibits edema.  Edema of bilateral lower extremities but worse on right compared to left.  Known DVT in this leg.  Neurological:  Patient is awake and appropriate.  Able to raise both legs up but is somewhat weak.  Reportedly this is unchanged since October.  Skin: Skin is warm. Capillary refill takes less than 2 seconds.  Psychiatric: She has a normal mood and affect.     ED Treatments / Results  Labs (all labs ordered are listed, but only abnormal results are displayed) Labs Reviewed  BASIC METABOLIC PANEL - Abnormal; Notable for the following components:      Result Value   Potassium 2.9 (*)    Calcium 8.2 (*)    All other components within normal limits  CBC - Abnormal; Notable for the following components:   WBC 14.1 (*)    RBC 3.40 (*)    Hemoglobin 9.1 (*)    HCT 29.3 (*)    RDW 18.7 (*)    All other components within normal limits  HEPATIC FUNCTION PANEL - Abnormal; Notable for the following components:   Total Protein 5.4 (*)    Albumin 2.7 (*)    AST 11 (*)    ALT 6 (*)    Bilirubin, Direct <0.1 (*)    All other components within normal limits  URINALYSIS, ROUTINE W REFLEX MICROSCOPIC  CBG MONITORING, ED    EKG  EKG Interpretation  Date/Time:  Thursday November 06 2017 11:24:43 EST Ventricular Rate:  74 PR Interval:    QRS Duration: 84 QT Interval:  402 QTC Calculation: 446 R Axis:   -115 Text Interpretation:  Sinus rhythm LAD, consider left anterior fascicular block Anteroseptal infarct, age indeterminate Confirmed by Davonna Belling  [phone]) on 11/06/2017 12:41:40 PM       Radiology Dg Chest 2 View  Result Date: 11/06/2017 CLINICAL DATA:  Weakness, history of lung carcinoma with metastatic disease EXAM: CHEST  2 VIEW COMPARISON:  05/12/2017 CT of the  chest FINDINGS: Cardiac shadow is stable. The lungs are again well aerated with some chronic changes and elevation of the right hemidiaphragm. Small right-sided pleural effusion is noted which appears new from the prior CT patchy densities are noted in the left lung which correspond to known lung lesions. No focal confluent infiltrate is seen. Small left pleural effusion is noted on the lateral film. IMPRESSION: Small bilateral pleural effusions right greater than left. There may be some underlying infiltrative changes on the right. Stable vague densities corresponding to known left-sided parenchymal lesions. Electronically Signed   By: Inez Catalina M.D.   On: 11/06/2017 15:07   Dg Abd 2 Views  Result Date: 11/06/2017 CLINICAL DATA:  77 year old female with a history weakness EXAM: ABDOMEN - 2 VIEW COMPARISON:  05/25/2014 FINDINGS: Gas within stomach, small bowel, colon. No abnormal distention. Decubitus image demonstrates no significant air-fluid levels. No radiopaque foreign body. No unexpected calcification. No unexpected soft tissue density. Calcified fibroids. Multilevel degenerative changes of the spine. No acute displaced fracture. Vascular calcifications. IMPRESSION: Nonobstructive bowel gas pattern. Electronically Signed   By: Corrie Mckusick D.O.   On: 11/06/2017 15:01    Procedures Procedures (including critical care time)  Medications Ordered in ED Medications  sodium chloride 0.9 % bolus 500 mL (0 mLs Intravenous Stopped 11/06/17 1449)     Initial Impression / Assessment and Plan / ED Course  I have reviewed the triage vital signs and the nursing notes.  Pertinent labs & imaging results that were available during my care of the patient were reviewed by me and  considered in my medical decision making (see chart for details).     Patient presents with generalized weakness.  Has known lung cancer with bony metastases.  Not currently on treatment due to DVT and generalized weakness.  Has been on antibiotics for C. difficile.  Continue diarrhea.  Has been getting more weak.  More difficulty ambulating now.  Has a worsening anemia.  She is on Lovenox injections.  With weakness anemia and diarrhea will admit to hospitalist.  Final Clinical Impressions(s) / ED Diagnoses   Final diagnoses:  Weakness  Generalized weakness  Anemia, unspecified type  Diarrhea of infectious origin  Clostridium difficile diarrhea    ED Discharge Orders    None       Davonna Belling, MD 11/06/17 1601

## 2017-11-06 NOTE — Progress Notes (Signed)
[place] for lovenox Indication: hx DVT  Allergies  Allergen Reactions  . Cymbalta [Duloxetine Hcl] Other (See Comments)    Involuntary movements " turned me completely around"  . Fish Allergy Diarrhea  . Amitriptyline     Initial reaction about 25 years ago.  . Codeine Nausea And Vomiting    vomiting  . Mirabegron Hypertension  . Solifenacin Other (See Comments)    Due to glaucoma  . Sulfa Antibiotics Nausea And Vomiting    Patient Measurements: height 65 inches, weight 60 kg   Heparin Dosing Weight:   Vital Signs: Temp: 98.9 F (37.2 C) (01/24 1119) Temp Source: Oral (01/24 1119) BP: 133/60 (01/24 1759) Pulse Rate: 92 (01/24 1759)  Labs: Recent Labs    11/06/17 1144  HGB 9.1*  HCT 29.3*  PLT 261  CREATININE 0.70    CrCl cannot be calculated (Unknown ideal weight.).   Medications:  PTA: lovenox 80 mg SQ daily (last dose 1/23 at [facility])  Assessment: Patient's a 77 y.o F with hx mets lung cancer, recent cdiff infection, PE (11/04/11), and DVT (09/03/17) on lovenox PTA, presented to the ED on 11/06/17 with c/o anemia and diarrhea.  To resume home lovenox inpatient.  Goal of Therapy:  Anti-Xa level 0.6-1 units/ml 4hrs after LMWH dose given Monitor platelets by anticoagulation protocol: Yes   Plan:  - lovenox 90 mg SQ daily (~1.5 mg/kg/day based on updated weight) - cbc q72h - monitor for s/s bleeding  Michell Giuliano P 11/06/2017,6:12 PM

## 2017-11-06 NOTE — ED Triage Notes (Addendum)
Patient here from [facility] with complaints of weakness. Hx of lung cancer with mets to bones, treatment discontinued in October. Also diagnosed with C-Diff in October, on antibiotics since. Hx of DVT in right leg with edema. Able to stand and pivot.

## 2017-11-06 NOTE — H&P (Addendum)
History and Physical    [name] BPZ:[id number] DOB: 01/11/1941 DOA: 11/06/2017  PCP: Harrison Mons, PA-C  Patient coming from: Assisted living facility  I have personally briefly reviewed patient's old medical records in [facility]  Chief Complaint: Weakness  HPI: [name] is a 77 y.o. 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 Arlyce Dice at which time patient refused adjuvant chemo with recurrence of disease June 2012.  Patient status post 4 cycles of systemic chemotherapy, palliative radiotherapy to lumbosacral spine per Dr. Tammi Klippel July 29, 2017, currently on immunotherapy first started 02/20/2016.  Patient also with recent diagnosis of DVT on full dose Lovenox and recently being treated for bronchitis.  Patient at assisted living facility presented to the ED with worsening weakness over the past few weeks.  Patient stated that weakness has been occurring since October 2018 and subsequently worsened and she was sent to the ED from her facility.  Patient endorses decreased oral intake, decreased appetite, nausea, productive cough of clear sputum, ongoing diarrhea which she states has been diagnosed with a C. difficile colitis approximately 1 week prior to admission and currently on metronidazole.  Patient denies any fevers, no chills, no emesis, no melena, no hematochezia, no hematemesis, no chest pain, no shortness of breath, no abdominal pain, no constipation, no dysuria.  Patient does endorse increased falls.  Patient denies any syncopal episode.  She states she is not happy at the current assisted living facility that she is at.  ED Course: She is seen  in the ED chest x-ray done was negative for any acute infiltrate.  Review of Systems: As per HPI otherwise 10 point review of systems negative.  Comprehensive metabolic profile with a potassium of 2.9, albumin of 2.7, AST of 11, ALT of 6, protein of 5.4 otherwise was within normal limits.  CBC had a white count of 14.1, hemoglobin of 9.1 otherwise within normal limits.  Urinalysis small leukocytes and nitrite -0-5 WBCs.  EKG with left anterior fascicular block with decreased voltage, no ischemic changes noted.  Past Medical History:  Diagnosis Date  . Bilateral lung cancer ([place]) 03/08/2011   recurrent  . Bone cancer ([place])    lung ca with spine mets  . Chronic fatigue 02/14/2016  . Dysuria 03/04/2017  . Encounter for antineoplastic immunotherapy 02/14/2016  . Foot pain 09/02/2011  . Glaucoma   . Hip pain 09/02/2011  . Hypercholesterolemia   . Hypertension   . Hypertension 10/29/2016  . Hypothyroidism   . Lung cancer ([place]) 03/08/2011   recurrent    Past Surgical History:  Procedure Laterality Date  . LUNG LOBECTOMY  09/28/2009   right upper  . THORACOTOMY  09/28/2009   mini  . VIDEO ASSISTED THORACOSCOPY  09/28/2009     reports that she quit smoking about 8 years ago. Her smoking use included cigarettes. She has a 50.00 pack-year smoking history. she has never used smokeless tobacco. She reports that she does not drink alcohol or use drugs.  Allergies  Allergen Reactions  . Cymbalta [Duloxetine Hcl] Other (See Comments)    Involuntary movements " turned me completely around"  . Fish Allergy Diarrhea  . Amitriptyline     Initial reaction about 25 years  ago.  . Codeine Nausea And Vomiting    vomiting  . Mirabegron Hypertension  . Solifenacin Other (See Comments)    Due to glaucoma  . Sulfa Antibiotics Nausea And Vomiting    Family History  Problem Relation Age of Onset  . Stroke Father   . Heart disease Father 27       MI  . Cancer Brother        lung  . Cancer Cousin         unknown   Family history reviewed and noncontributory.  Prior to Admission medications   Medication Sig Start Date End Date Taking? Authorizing Provider  acetaminophen (TYLENOL) 500 MG tablet Take 500 mg by mouth every 4 (four) hours as needed for moderate pain, fever or headache.    Yes [provider]  alum & mag hydroxide-simeth ([place]) 200-200-20 MG/5ML suspension Take 30 mLs by mouth every 6 (six) hours as needed for indigestion or heartburn.   Yes [provider]  amLODipine (NORVASC) 5 MG tablet Take 2 tablets (10 mg total) by mouth daily. Per [name], NP 09/18/17  Yes Harrison Mons, PA-C  aspirin 81 MG tablet Take 1 tablet (81 mg total) by mouth daily. 09/18/17  Yes Jeffery, Chelle, PA-C  benzonatate (TESSALON) 100 MG capsule Take 1-2 capsules (100-200 mg total) by mouth 3 (three) times daily as needed for cough. Patient taking differently: Take 100 mg by mouth 3 (three) times daily as needed for cough.  10/15/17  Yes Jeffery, Chelle, PA-C  clonazePAM (KLONOPIN) 1 MG tablet Take 1 tablet (1 mg total) by mouth at bedtime. 09/18/17  Yes Jeffery, Chelle, PA-C  cyanocobalamin (,VITAMIN B-12,) 1000 MCG/ML injection Inject 1,000 mcg into the muscle every 30 (thirty) days.    Yes [provider]  dorzolamide-timolol (COSOPT) 22.3-6.8 MG/ML ophthalmic solution Place 2 drops into both eyes 2 (two) times daily. Per [name], NP 09/18/17  Yes Jacqulynn Cadet, Chelle, PA-C  enoxaparin (LOVENOX) 80 MG/0.8ML injection Inject 80 mg into the skin daily.    Yes [provider]  feeding supplement, ENSURE ENLIVE, (ENSURE ENLIVE) LIQD Take 237 mLs by mouth 2 (two) times daily between meals. 07/18/17  Yes Eugenie Filler, MD  fluticasone [facility]) 50 MCG/ACT nasal spray Place 1 spray into both nostrils daily as needed for allergies or rhinitis.   Yes [provider]  Guaifenesin (MUCINEX MAXIMUM STRENGTH) 1200 MG TB12 Take 1 tablet (1,200 mg total) by mouth  every 12 (twelve) hours as needed. Patient taking differently: Take 1 tablet by mouth every 12 (twelve) hours as needed.  10/15/17  Yes Jeffery, Chelle, PA-C  guaiFENesin-codeine (ROBAFEN AC) 100-10 MG/5ML syrup Take 10 mLs by mouth 4 (four) times daily as needed for cough.    Yes [provider]  levothyroxine (SYNTHROID, LEVOTHROID) 88 MCG tablet Take 1 tablet (88 mcg total) by mouth daily before breakfast. 09/18/17  Yes Jeffery, Chelle, PA-C  lidocaine-prilocaine (EMLA) cream Apply 1 application topically as needed (prior to port access). 03/05/16  Yes Curt Bears, MD  lisinopril (PRINIVIL,ZESTRIL) 40 MG tablet Take 1 tablet (40 mg total) by mouth daily. 09/18/17  Yes Jeffery, Chelle, PA-C  Loperamide HCl (LOPERAMIDE A-D PO) Take 2 mg by mouth every 3 (three) hours as needed (for diarrhea).    Yes [provider]  magnesium hydroxide (MILK OF MAGNESIA) 400 MG/5ML suspension Take 30 mLs by mouth daily as needed for mild constipation.   Yes [provider]  metroNIDAZOLE (FLAGYL) 500 MG tablet  Take 500 mg by mouth 3 (three) times daily. 10/29/17  Yes [provider]  traMADol (ULTRAM) 50 MG tablet Take 1 tablet (50 mg total) by mouth every 6 (six) hours as needed for severe pain. 09/18/17  Yes Jeffery, Chelle, PA-C  meloxicam (MOBIC) 7.5 MG tablet TAKE 1 TABLET BY MOUTH EVERY DAY Patient not taking: Reported on 11/06/2017 06/09/17   Harrison Mons, PA-C  mirtazapine (REMERON) 7.5 MG tablet Take 1 tablet (7.5 mg total) by mouth at bedtime. Patient not taking: Reported on 11/06/2017 07/04/17   Harrison Mons, PA-C    Physical Exam: Vitals:   11/06/17 1400 11/06/17 1527 11/06/17 1700 11/06/17 1730  BP: (!) 113/53 121/60 (!) 118/48 133/60  Pulse: 77 81 86 90  Resp: (!) 23 15 (!) 33 (!) 23  Temp:      TempSrc:      SpO2:  95%      Constitutional: NAD, calm, comfortable Vitals:   11/06/17 1400 11/06/17 1527 11/06/17 1700 11/06/17 1730  BP: (!) 113/53 121/60  (!) 118/48 133/60  Pulse: 77 81 86 90  Resp: (!) 23 15 (!) 33 (!) 23  Temp:      TempSrc:      SpO2:  95%     Eyes: PERRLA, lids and conjunctivae normal ENMT: Mucous membranes are dry. Posterior pharynx clear of any exudate or lesions.Normal dentition.  Neck: normal, supple, no masses, no thyromegaly Respiratory: clear to auscultation bilaterally, no wheezing, no crackles. Normal respiratory effort. No accessory muscle use.  Cardiovascular: Regular rate and rhythm, no murmurs / rubs / gallops.  1+ bilateral lower extremity edema.  2+ pedal pulses. No carotid bruits.  Abdomen: no tenderness, no masses palpated. No hepatosplenomegaly. Bowel sounds positive.  Musculoskeletal: no clubbing / cyanosis. No joint deformity upper and lower extremities. Good ROM, no contractures. Normal muscle tone.  Skin: no rashes, lesions, ulcers. No induration Neurologic: CN 2-12 grossly intact. Sensation intact, DTR normal. Strength 5/5 in all 4.  Psychiatric: Normal judgment and insight. Alert and oriented x 3. Normal mood.   Labs on Admission: I have personally reviewed following labs and imaging studies  CBC: Recent Labs  Lab 11/06/17 1144  WBC 14.1*  HGB 9.1*  HCT 29.3*  MCV 86.2  PLT 371   Basic Metabolic Panel: Recent Labs  Lab 11/06/17 1144  NA 141  K 2.9*  CL 109  CO2 24  GLUCOSE 88  BUN 9  CREATININE 0.70  CALCIUM 8.2*   GFR: CrCl cannot be calculated (Unknown ideal weight.). Liver Function Tests: Recent Labs  Lab 11/06/17 1144  AST 11*  ALT 6*  ALKPHOS 102  BILITOT 0.3  PROT 5.4*  ALBUMIN 2.7*   No results for input(s): LIPASE, AMYLASE in the last 168 hours. No results for input(s): AMMONIA in the last 168 hours. Coagulation Profile: No results for input(s): INR, PROTIME in the last 168 hours. Cardiac Enzymes: No results for input(s): CKTOTAL, CKMB, CKMBINDEX, TROPONINI in the last 168 hours. BNP (last 3 results) No results for input(s): PROBNP in the last 8760  hours. HbA1C: No results for input(s): HGBA1C in the last 72 hours. CBG: Recent Labs  Lab 11/06/17 1147  GLUCAP 88   Lipid Profile: No results for input(s): CHOL, HDL, LDLCALC, TRIG, CHOLHDL, LDLDIRECT in the last 72 hours. Thyroid Function Tests: No results for input(s): TSH, T4TOTAL, FREET4, T3FREE, THYROIDAB in the last 72 hours. Anemia Panel: Recent Labs    11/06/17 1621  RETICCTPCT 2.6   Urine analysis:  Component Value Date/Time   COLORURINE AMBER (A) 11/06/2017 1144   APPEARANCEUR CLEAR 11/06/2017 1144   LABSPEC 1.026 11/06/2017 1144   LABSPEC 1.005 07/06/2014 1434   PHURINE 6.0 11/06/2017 1144   GLUCOSEU NEGATIVE 11/06/2017 1144   GLUCOSEU Negative 07/06/2014 1434   HGBUR NEGATIVE 11/06/2017 1144   BILIRUBINUR NEGATIVE 11/06/2017 1144   BILIRUBINUR Negative 07/06/2014 1434   KETONESUR NEGATIVE 11/06/2017 1144   PROTEINUR NEGATIVE 11/06/2017 1144   UROBILINOGEN 0.2 01/11/2015 1536   UROBILINOGEN 0.2 07/06/2014 1434   NITRITE NEGATIVE 11/06/2017 1144   LEUKOCYTESUR SMALL (A) 11/06/2017 1144   LEUKOCYTESUR Trace 07/06/2014 1434    Radiological Exams on Admission: Dg Chest 2 View  Result Date: 11/06/2017 CLINICAL DATA:  Weakness, history of lung carcinoma with metastatic disease EXAM: CHEST  2 VIEW COMPARISON:  05/12/2017 CT of the chest FINDINGS: Cardiac shadow is stable. The lungs are again well aerated with some chronic changes and elevation of the right hemidiaphragm. Small right-sided pleural effusion is noted which appears new from the prior CT patchy densities are noted in the left lung which correspond to known lung lesions. No focal confluent infiltrate is seen. Small left pleural effusion is noted on the lateral film. IMPRESSION: Small bilateral pleural effusions right greater than left. There may be some underlying infiltrative changes on the right. Stable vague densities corresponding to known left-sided parenchymal lesions. Electronically Signed   By:  Inez Catalina M.D.   On: 11/06/2017 15:07   Dg Abd 2 Views  Result Date: 11/06/2017 CLINICAL DATA:  77 year old female with a history weakness EXAM: ABDOMEN - 2 VIEW COMPARISON:  05/25/2014 FINDINGS: Gas within stomach, small bowel, colon. No abnormal distention. Decubitus image demonstrates no significant air-fluid levels. No radiopaque foreign body. No unexpected calcification. No unexpected soft tissue density. Calcified fibroids. Multilevel degenerative changes of the spine. No acute displaced fracture. Vascular calcifications. IMPRESSION: Nonobstructive bowel gas pattern. Electronically Signed   By: Corrie Mckusick D.O.   On: 11/06/2017 15:01    EKG: Independently reviewed.  Anterior fascicular block.  Low voltage.  No ischemic changes noted.  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 difficile colitis: Per patient tested positive last week.   Anemia   #1 generalized weakness Patient presented with generalized weakness which she states has worsened over the past few weeks however started in October 2018.  Patient with metastatic lung cancer including spinal metastases being followed by oncology.  Patient moving extremities spontaneously.  Patient denies any bowel or bladder incontinence.  Patient also noted to have diarrhea and states was recently diagnosed approximately a week ago with a C. difficile colitis.  Will admit patient to inpatient.  Placed on IV fluids.  Repeat C. difficile PCR.  Urine cultures pending.  Check a respiratory viral panel by PCR, influenza PCR, blood cultures x2, urine cultures.  Placed empirically on IV fluids.  Will place on oral vancomycin and treat for 10 days.  PT/OT.  Supportive care.  2.  Hypokalemia Likely secondary to GI  losses from diarrhea per patient.  Check a magnesium level.  Replete.  3.  Hypothyroidism Check a TSH.  Continue home dose Synthroid.  4.  Dehydration IV fluids.  5.  History of recently diagnosed DVT Lovenox per  pharmacy.  6.  Acute bronchitis Patient presented with a worsening cough of clear sputum.  Chest x-ray done was negative for any acute infiltrate.  Place patient on Augmentin, scheduled Hycodan, scheduled nebulizers, Pulmicort, Claritin, Flonase, PPI.  Follow.  7.  Anemia Patient without any overt bleeding.  Check an anemia panel.  FOBT.  Transfusion threshold hemoglobin less than 7.  Follow.  8.  Probable C. difficile colitis Patient states was diagnosed a week ago with a C. difficile colitis.  Patient currently on metronidazole.  Will discontinue metronidazole.  Repeat a C. difficile PCR.  Placed on oral vancomycin and treat for total of 10 days.  IV fluids.  Supportive care.  9.  Metastatic lung cancer We will inform oncology of patient's admission.  Outpatient follow-up with hematology/oncology.  DVT prophylaxis: Lovenox Code Status: Full Family Communication: Updated patient.  No family at bedside. Disposition Plan: Likely skilled nursing facility once medically stable. Consults called: Oncology notified via epic, Dr. Curt Bears Admission status: Admit to inpatient.   Irine Seal MD [facility] Pager [phone]  If 7PM-7AM, please contact night-coverage [email or web address] Password [facility]  11/06/2017, 5:57 PM

## 2017-11-06 NOTE — Progress Notes (Signed)
Received call from lab. Stool too formed to perform c-diff testing.  Will plan to send another sample when available.

## 2017-11-06 NOTE — ED Notes (Signed)
Bed: OL41 Expected date:  Expected time:  Means of arrival:  Comments: EMS-weakness/C-Diff

## 2017-11-07 ENCOUNTER — Inpatient Hospital Stay (HOSPITAL_COMMUNITY): Payer: Medicare Other

## 2017-11-07 DIAGNOSIS — L89109 Pressure ulcer of unspecified part of back, unspecified stage: Secondary | ICD-10-CM

## 2017-11-07 DIAGNOSIS — E440 Moderate protein-calorie malnutrition: Secondary | ICD-10-CM

## 2017-11-07 DIAGNOSIS — C7951 Secondary malignant neoplasm of bone: Secondary | ICD-10-CM

## 2017-11-07 DIAGNOSIS — L8990 Pressure ulcer of unspecified site, unspecified stage: Secondary | ICD-10-CM

## 2017-11-07 DIAGNOSIS — A09 Infectious gastroenteritis and colitis, unspecified: Secondary | ICD-10-CM

## 2017-11-07 LAB — RESPIRATORY PANEL BY PCR
Adenovirus: NOT DETECTED
Bordetella pertussis: NOT DETECTED
CORONAVIRUS 229E-RVPPCR: NOT DETECTED
CORONAVIRUS HKU1-RVPPCR: NOT DETECTED
CORONAVIRUS OC43-RVPPCR: NOT DETECTED
Chlamydophila pneumoniae: NOT DETECTED
Coronavirus NL63: NOT DETECTED
Influenza A: NOT DETECTED
Influenza B: NOT DETECTED
METAPNEUMOVIRUS-RVPPCR: NOT DETECTED
Mycoplasma pneumoniae: NOT DETECTED
PARAINFLUENZA VIRUS 1-RVPPCR: NOT DETECTED
PARAINFLUENZA VIRUS 2-RVPPCR: NOT DETECTED
Parainfluenza Virus 3: NOT DETECTED
Parainfluenza Virus 4: NOT DETECTED
Respiratory Syncytial Virus: NOT DETECTED
Rhinovirus / Enterovirus: NOT DETECTED

## 2017-11-07 LAB — COMPREHENSIVE METABOLIC PANEL
ALT: 6 U/L — ABNORMAL LOW (ref 14–54)
ANION GAP: 7 (ref 5–15)
AST: 10 U/L — AB (ref 15–41)
Albumin: 2.6 g/dL — ABNORMAL LOW (ref 3.5–5.0)
Alkaline Phosphatase: 111 U/L (ref 38–126)
BILIRUBIN TOTAL: 0.4 mg/dL (ref 0.3–1.2)
BUN: 7 mg/dL (ref 6–20)
CALCIUM: 8.1 mg/dL — AB (ref 8.9–10.3)
CO2: 22 mmol/L (ref 22–32)
Chloride: 111 mmol/L (ref 101–111)
Creatinine, Ser: 0.67 mg/dL (ref 0.44–1.00)
GFR calc Af Amer: >60 mL/min (ref >60)
GFR calc non Af Amer: >60 mL/min (ref >60)
Glucose, Bld: 88 mg/dL (ref 65–99)
POTASSIUM: 3.5 mmol/L (ref 3.5–5.1)
Sodium: 140 mmol/L (ref 135–145)
Total Protein: 5.4 g/dL — ABNORMAL LOW (ref 6.5–8.1)

## 2017-11-07 LAB — CBC
HEMATOCRIT: 33.2 % — AB (ref 36.0–46.0)
Hemoglobin: 10.4 g/dL — ABNORMAL LOW (ref 12.0–15.0)
MCH: 27.2 pg (ref 26.0–34.0)
MCHC: 31.3 g/dL (ref 30.0–36.0)
MCV: 86.7 fL (ref 78.0–100.0)
Platelets: 281 10*3/uL (ref 150–400)
RBC: 3.83 MIL/uL — ABNORMAL LOW (ref 3.87–5.11)
RDW: 19.2 % — AB (ref 11.5–15.5)
WBC: 15.4 10*3/uL — ABNORMAL HIGH (ref 4.0–10.5)

## 2017-11-07 LAB — PROTIME-INR
INR: 1.2
PROTHROMBIN TIME: 15.1 s (ref 11.4–15.2)

## 2017-11-07 LAB — MRSA PCR SCREENING: MRSA by PCR: NEGATIVE

## 2017-11-07 MED ORDER — POTASSIUM CHLORIDE CRYS ER 20 MEQ PO TBCR
40.0000 meq | EXTENDED_RELEASE_TABLET | Freq: Once | ORAL | Status: AC
Start: 2017-11-07 — End: 2017-11-07
  Administered 2017-11-07: 40 meq via ORAL
  Filled 2017-11-07: qty 2

## 2017-11-07 MED ORDER — AMOXICILLIN-POT CLAVULANATE 875-125 MG PO TABS
1.0000 | ORAL_TABLET | Freq: Two times a day (BID) | ORAL | Status: DC
  Administered 2017-11-07: 1 via ORAL
  Filled 2017-11-07: qty 1

## 2017-11-07 MED ORDER — POTASSIUM PHOSPHATES 15 MMOLE/5ML IV SOLN
30.0000 mmol | Freq: Once | INTRAVENOUS | Status: AC
  Administered 2017-11-07: 30 mmol via INTRAVENOUS
  Filled 2017-11-07: qty 10

## 2017-11-07 MED ORDER — DEXTROSE 5 % IV SOLN
1.0000 g | INTRAVENOUS | Status: DC
  Administered 2017-11-07 – 2017-11-09 (×3): 1 g via INTRAVENOUS
  Filled 2017-11-07 (×4): qty 10

## 2017-11-07 MED ORDER — DEXTROSE 5 % IV SOLN
500.0000 mg | INTRAVENOUS | Status: DC
  Administered 2017-11-07 – 2017-11-09 (×3): 500 mg via INTRAVENOUS
  Filled 2017-11-07 (×4): qty 500

## 2017-11-07 MED ORDER — FUROSEMIDE 10 MG/ML IJ SOLN
20.0000 mg | Freq: Once | INTRAMUSCULAR | Status: AC
  Administered 2017-11-07: 20 mg via INTRAVENOUS
  Filled 2017-11-07: qty 2

## 2017-11-07 NOTE — Evaluation (Signed)
Physical Therapy Evaluation Patient Details Name: [name] MRN: [id number] DOB: Dec 28, 1940 Today's Date: 11/07/2017   History of Present Illness  77 yo female with onset of weakness and has recurrent lung CA with bronchitis, mets. PMHx:  PE, DJD lumbar spine, HTN, hip pain, ALF resident   Clinical Impression  Pt was assessed for gait and transfers after having been in bed for several days and needing significant help to get up from side of bed.  Pt is agreeable and is willing to go to a rehab setting to get stronger for return to ALF.  Her plan is for acute therapy for strength and balance and progress to SNF when appropriate for completion of rehab.  Per pt, she has been mobilzing in a WC for a time, and was walking on RW in the recent past before rehab was disrupted by insurance coverage.  Follow acutely for above needs.    Follow Up Recommendations SNF    Equipment Recommendations  Rolling walker with 5" wheels    Recommendations for Other Services       Precautions / Restrictions Precautions Precautions: Fall(telemetry) Restrictions Weight Bearing Restrictions: No      Mobility  Bed Mobility Overal bed mobility: Needs Assistance Bed Mobility: Supine to Sit;Sit to Supine     Supine to sit: Mod assist Sit to supine: Mod assist   General bed mobility comments: assisted trunk out of bed and legs to return to bed with cues for sequence and safety  Transfers Overall transfer level: Needs assistance Equipment used: Rolling walker (2 wheeled);1 person hand held assist Transfers: Sit to/from Stand Sit to Stand: Mod assist         General transfer comment: cued hand placement and then helped to power up  Ambulation/Gait Ambulation/Gait assistance: Min assist;Mod assist Ambulation Distance (Feet): 4 Feet Assistive device: Rolling walker (2 wheeled);1 person hand held assist Gait Pattern/deviations: Step-to pattern;Shuffle;Decreased stride length;Wide base of  support Gait velocity: reduced Gait velocity interpretation: Below normal speed for age/gender General Gait Details: sidesteps to head of bed with cues for all sequencing and use of wlaker  Stairs            Wheelchair Mobility    Modified Rankin (Stroke Patients Only)       Balance Overall balance assessment: History of Falls;Needs assistance Sitting-balance support: Feet supported;Bilateral upper extremity supported Sitting balance-Leahy Scale: Fair     Standing balance support: Bilateral upper extremity supported;During functional activity Standing balance-Leahy Scale: Poor Standing balance comment: Pt is fearful of her balance due to feeling like her feet are not secure                             Pertinent Vitals/Pain Pain Assessment: Faces Faces Pain Scale: Hurts little more Pain Location: L hip Pain Descriptors / Indicators: Sore Pain Intervention(s): Monitored during session;Repositioned    Home Living Family/patient expects to be discharged to:: Assisted living Living Arrangements: Other (Comment)             Home Equipment: Gilford Rile - 2 wheels;Cane - single point;Shower seat Additional Comments: has ALF assistance but reports she has her own home as well    Prior Function Level of Independence: Independent with assistive device(s)         Comments: used RW in home     Hand Dominance   Dominant Hand: Right    Extremity/Trunk Assessment   Upper Extremity Assessment Upper Extremity Assessment:  Generalized weakness    Lower Extremity Assessment Lower Extremity Assessment: Generalized weakness    Cervical / Trunk Assessment Cervical / Trunk Assessment: Kyphotic  Communication   Communication: No difficulties  Cognition Arousal/Alertness: Awake/alert Behavior During Therapy: WFL for tasks assessed/performed Overall Cognitive Status: No family/caregiver present to determine baseline cognitive functioning                                  General Comments: has some areas of memory that are vague but no family to help with how declined this is      General Comments      Exercises     Assessment/Plan    PT Assessment Patient needs continued PT services  PT Problem List Decreased strength;Decreased range of motion;Decreased activity tolerance;Decreased balance;Decreased mobility;Decreased coordination;Decreased knowledge of use of DME;Decreased safety awareness;Cardiopulmonary status limiting activity;Decreased skin integrity       PT Treatment Interventions DME instruction;Gait training;Functional mobility training;Therapeutic activities;Therapeutic exercise;Balance training;Neuromuscular re-education;Patient/family education    PT Goals (Current goals can be found in the Care Plan section)  Acute Rehab PT Goals Patient Stated Goal: to get stronger and walk more  PT Goal Formulation: With patient Time For Goal Achievement: 11/21/17 Potential to Achieve Goals: Good    Frequency Min 2X/week   Barriers to discharge Decreased caregiver support(has less assistance in ALF)      Co-evaluation               AM-PAC PT "6 Clicks" Daily Activity  Outcome Measure Difficulty turning over in bed (including adjusting bedclothes, sheets and blankets)?: Unable Difficulty moving from lying on back to sitting on the side of the bed? : Unable Difficulty sitting down on and standing up from a chair with arms (e.g., wheelchair, bedside commode, etc,.)?: Unable Help needed moving to and from a bed to chair (including a wheelchair)?: A Lot Help needed walking in hospital room?: A Lot Help needed climbing 3-5 steps with a railing? : Total 6 Click Score: 8    End of Session Equipment Utilized During Treatment: Gait belt Activity Tolerance: Patient limited by fatigue Patient left: in bed;with call bell/phone within reach;with bed alarm set Nurse Communication: Mobility status PT Visit Diagnosis:  Unsteadiness on feet (R26.81);Other abnormalities of gait and mobility (R26.89);Muscle weakness (generalized) (M62.81);Ataxic gait (R26.0);Difficulty in walking, not elsewhere classified (R26.2)    Time: 9798-9211 PT Time Calculation (min) (ACUTE ONLY): 27 min   Charges:   PT Evaluation $PT Eval Moderate Complexity: 1 Mod PT Treatments $Gait Training: 8-22 mins   PT G Codes:   PT G-Codes **NOT FOR INPATIENT CLASS** Functional Assessment Tool Used: AM-PAC 6 Clicks Basic Mobility    Ramond Dial 11/07/2017, 5:58 PM   Mee Hives, PT MS Acute Rehab Dept. Number: [place]

## 2017-11-07 NOTE — Progress Notes (Signed)
OT Cancellation Note  Patient Details Name: [name] MRN: [id number] DOB: 17-Mar-1941   Cancelled Treatment:    Reason Eval/Treat Not Completed: Fatigue/lethargy limiting ability to participate  Will check on pt tomorrow.   Betsy Pries 11/07/2017, 12:47 PM

## 2017-11-07 NOTE — Progress Notes (Signed)
PT Cancellation Note  Patient Details Name: [name] MRN: [id number] DOB: [birth year]   Cancelled Treatment:    Reason Eval/Treat Not Completed: Fatigue/lethargy limiting ability to participate.  Declined as she feels too weak yet, and agreed to try tomorrow.   Ramond Dial 11/07/2017, 12:21 PM   Mee Hives, PT [place] Acute Rehab Dept. Number: [place]

## 2017-11-07 NOTE — Clinical Social Work Note (Signed)
Clinical Social Work Assessment  Patient Details  Name: [name] MRN: [id number] Date of Birth: [birth year]  Date of referral:  11/07/17               Reason for consult:  (admitted from facility)                Permission sought to share information with:  Family Supports Permission granted to share information::  Yes, Verbal Permission Granted  Name::     daughter Delanna Notice  Agency::     Relationship::     Contact Information:     Housing/Transportation Living arrangements for the past 2 months:  [place] of Information:  Patient, Medical Team Patient Interpreter Needed:  None Criminal Activity/Legal Involvement Pertinent to Current Situation/Hospitalization:  No - Comment as needed Significant Relationships:  Adult Children, Neighbor Lives with:  Facility Resident Do you feel safe going back to the place where you live?  Yes Need for family participation in patient care:  No (Coment)  Care giving concerns:  Pt admitted from [facility] where she has resided since 07/2017 (moved after completing rehab at [facility] after her hospitalization at [facility]).  Pt states she has been doing well at [facility], walking with walker or using wheelchair "when I'm weak." Pt states she is not having any further treatment for her cancer currently because "I completed radiation and I am too weak for any more treatment or chemo." Pt states she is unsure what her prognosis is at this point. States her goal is "to get strong enough for more treatment." States [facility] staff assist her with bathing and grooming.   Social Worker assessment / plan:  CSW consulted as pt admitted from facility- [facility].  She states plan is to return there at [place] unless higher level of care needed. Is open to pursuing going to rehab at SNF again if needed.  States, "I wish I could go back home, that's really the only place I want to go, but I know I just  can't in this condition." See above re: care concerns. Spoke with [employer]- aware of pt's status.  Will follow and assist with disposition needs.   Employment status:  Retired Nurse, adult PT Recommendations:  (pending) Information / Referral to community resources:     Patient/Family's Response to care:  Pt engaged and appreciative  Patient/Family's Understanding of and Emotional Response to Diagnosis, Current Treatment, and Prognosis:  Pt demonstrates good understanding of her status and potential plans. Emotionally seems hopeful but also reasonable in expectations- see comments above.  Emotional Assessment Appearance:  Appears stated age Attitude/Demeanor/Rapport:  Engaged(pleasant) Affect (typically observed):    Orientation:  Oriented to Self, Oriented to Place, Oriented to  Time, Oriented to Situation Alcohol / Substance use:  Not Applicable Psych involvement (Current and /or in the community):  No (Comment)  Discharge Needs  Concerns to be addressed:  Care Coordination Readmission within the last 30 days:  No Current discharge risk:  (still assessing) Barriers to Discharge:  Continued Medical Work up   [facility], LCSW 11/07/2017, 10:05 AM  [phone]

## 2017-11-07 NOTE — Progress Notes (Signed)
Initial Nutrition Assessment  DOCUMENTATION CODES:   Non-severe (moderate) malnutrition in context of chronic illness  INTERVENTION:   Continue Ensure Enlive po BID, each supplement provides 350 kcal and 20 grams of protein  Encouraged PO intake  NUTRITION DIAGNOSIS:   Moderate Malnutrition related to cancer and cancer related treatments, chronic illness as evidenced by moderate fat depletion, moderate muscle depletion.  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    Pt reports decreased appetite over the past 3 weeks. Reporting decreased PO intake at current living facility d/t the food being cold and options in general. Pt notices she can occasionally get SOB while eating and drinking.   Pt endorses weight loss however is unable to state a UBW. Per weight history, pt's weight has been trending upwards over past 3 months.   Pt was consuming Ensure PTA and is willing to continue supplementation while admitted. Pt reports she was "not ready" for one at time of visit.   Labs reviewed; Phosphorus 2.3, Albumin 2.6, Iron 27, TIBC 174 Medications reviewed; Vitamin B12, potassium chloride, IV potassium phosphate   NUTRITION - FOCUSED PHYSICAL EXAM:    Most Recent Value  Orbital Region  Moderate depletion  Upper Arm Region  Moderate depletion  Thoracic and Lumbar Region  Unable to assess  Buccal Region  Mild depletion  Temple Region  Moderate depletion  Clavicle Bone Region  Moderate depletion  Clavicle and Acromion Bone Region  Moderate depletion   Scapular Bone Region  Unable to assess  Dorsal Hand  Moderate depletion  Patellar Region  Mild depletion  Anterior Thigh Region  Moderate depletion  Posterior Calf Region  Moderate depletion      Diet Order:  DIET DYS 3 Room service appropriate? Yes; Fluid consistency: Thin  EDUCATION NEEDS:   Not appropriate for education at this time  Skin:  Skin Assessment: Skin Integrity Issues: Skin Integrity Issues:: Stage I Stage I: sacrum  Last BM:  11/06/17  Height:   Ht Readings from Last 1 Encounters:  11/06/17 5\' 5"  (1.651 m)    Weight:   Wt Readings from Last 1 Encounters:  11/06/17 131 lb 13.4 oz (59.8 kg)    Ideal Body Weight:  56.8 kg  BMI:  Body mass index is 21.94 kg/m.  Estimated Nutritional Needs:   Kcal:  1550-1750  Protein:  80-90  Fluid:  >/= 1.5 L/d  Parks Ranger, MS, RDN, LDN 11/07/2017 12:38 PM

## 2017-11-07 NOTE — Progress Notes (Addendum)
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 difficile colitis prior to admission.  Patient noted to have some electrolyte abnormalities.  Patient placed on IV fluids, metronidazole changed to oral vancomycin.  PT/OT.   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 difficile colitis: Per patient tested positive last week.   Anemia   Pressure injury of skin  #1 generalized weakness Patient presented with generalized weakness which she stated had worsened over the past few weeks however started in October 2018.  Patient with metastatic lung cancer including spinal metastases being followed by oncology.  Patient moving extremities spontaneously.  Patient denies any bowel or bladder incontinence.  Patient also noted to have diarrhea and stated was recently diagnosed approximately a week ago with a C. difficile colitis.  Patient still with generalized weakness however some improvement since admission.  Saline lock IV fluids.  Repeat C. difficile PCR was canceled.  Urine cultures pending.  Influenza PCR negative.  Respiratory virus panel negative.  Blood cultures pending.  Continue oral vancomycin and treat for approximately 10 days.  PT/OT.  Supportive care.   2.  Hypokalemia Likely secondary to GI losses from diarrhea per patient.    Magnesium level at 1.9.  Give a dose of K-Dur 40 p.o. x1.   3.  Hypothyroidism TSH at 4.495.  Continue home dose Synthroid.  4.  Dehydration IV  fluids.  5.  History of recently diagnosed DVT Lovenox per pharmacy.  6.  Acute bronchitis vs CAP Patient presented with a worsening cough of clear sputum.  Chest x-ray done was negative for any acute infiltrate.  Repeat chest x-ray with stable patchy bilateral airspace disease.  Check a urine strep pneumococcus antigen, check a urine Legionella antigen check a sputum Gram stain and culture.  Continue scheduled Hycodan, scheduled nebulizers, Pulmicort, Claritin, Flonase, PPI.  Change oral Augmentin to IV Rocephin and azithromycin. Follow.  7.  Anemia Patient without any overt bleeding.  Anemia panel consistent with anemia of chronic disease/deficiency anemia. Check an anemia panel.  FOBT.  Hemoglobin currently stable at 10.4. Transfusion threshold hemoglobin less than 7.  Follow.  8.  Probable C. difficile colitis Patient states was diagnosed a week ago with a C. difficile colitis.  Patient was on metronidazole prior to admission.    Metronidazole has been discontinued.  Consistency of stools improving.  Repeat C. difficile PCR was canceled.  Continue oral vancomycin and treat for total of 10 days.  IV fluids.  Supportive care.    9.  Metastatic lung cancer Oncology has been informed of patient's admission via epic.  Outpatient follow-up with hematology/oncology.  #10 pressure injury stage I sacral Frequent turns.      DVT prophylaxis: Lovenox Code Status: Full Family Communication: Patient.  No family at bedside. Disposition Plan: Skilled nursing facility versus ALF pending PT evaluation.   Consultants:   None  Procedures:   Chest x-ray 11/07/2017, 11/06/2017  Abdominal films 11/06/2017  Antimicrobials:   None   Subjective:  Patient laying in bed.  Patient states feels a little bit better however significantly weak.  No chest pain.  No shortness of breath.  States cough improving.  Objective: Vitals:   11/06/17 1759 11/06/17 1854 11/06/17 1922 11/06/17 2015  BP:  133/60 131/62  (!) 122/56  Pulse: 92 91  95  Resp: (!) 25 (!) 22  16  Temp:  98 F (36.7 C)  98 F (36.7 C)  TempSrc:  Oral  Oral  SpO2: 90% 97% 97% 93%  Weight:  59.8 kg (131 lb 13.4 oz)    Height:  5\' 5"  (1.651 m)      Intake/Output Summary (Last 24 hours) at 11/07/2017 1219 Last data filed at 11/07/2017 0600 Gross per 24 hour  Intake 1483.33 ml  Output -  Net 1483.33 ml   Filed Weights   11/06/17 1854  Weight: 59.8 kg (131 lb 13.4 oz)    Examination:  General exam: NAD  Respiratory system: Some scattered coarse breath sounds no wheezing no crackles. Respiratory effort normal. Cardiovascular system: S1 & S2 heard, RRR. No JVD, murmurs, rubs, gallops or clicks.  1+ bilateral lower extremity edema. Gastrointestinal system: Abdomen is nondistended, soft and nontender. No organomegaly or masses felt. Normal bowel sounds heard. Central nervous system: Alert and oriented. No focal neurological deficits. Extremities: Symmetric 5 x 5 power. Skin: No rashes, lesions or ulcers Psychiatry: Judgement and insight appear normal. Mood & affect appropriate.     Data Reviewed: I have personally reviewed following labs and imaging studies  CBC: Recent Labs  Lab 11/06/17 1144 11/07/17 0901  WBC 14.1* 15.4*  HGB 9.1* 10.4*  HCT 29.3* 33.2*  MCV 86.2 86.7  PLT 261 144   Basic Metabolic Panel: Recent Labs  Lab 11/06/17 1144 11/06/17 2000 11/07/17 0901  NA 141  --  140  K 2.9*  --  3.5  CL 109  --  111  CO2 24  --  22  GLUCOSE 88  --  88  BUN 9  --  7  CREATININE 0.70  --  0.67  CALCIUM 8.2*  --  8.1*  MG 2.0 1.9  --   PHOS  --  2.3*  --    GFR: Estimated Creatinine Clearance: 53 mL/min (by C-G formula based on SCr of 0.67 mg/dL). Liver Function Tests: Recent Labs  Lab 11/06/17 1144 11/07/17 0901  AST 11* 10*  ALT 6* 6*  ALKPHOS 102 111  BILITOT 0.3 0.4  PROT 5.4* 5.4*  ALBUMIN 2.7* 2.6*   No results for input(s): LIPASE, AMYLASE in the last 168 hours. No  results for input(s): AMMONIA in the last 168 hours. Coagulation Profile: Recent Labs  Lab 11/07/17 0901  INR 1.20   Cardiac Enzymes: No results for input(s): CKTOTAL, CKMB, CKMBINDEX, TROPONINI in the last 168 hours. BNP (last 3 results) No results for input(s): PROBNP in the last 8760 hours. HbA1C: No results for input(s): HGBA1C in the last 72 hours. CBG: Recent Labs  Lab 11/06/17 1147  GLUCAP 88   Lipid Profile: No results for input(s): CHOL, HDL, LDLCALC, TRIG, CHOLHDL, LDLDIRECT in the last 72 hours. Thyroid Function Tests: Recent Labs    11/06/17 2000  TSH 4.495   Anemia Panel: Recent Labs    11/06/17 1621  VITAMINB12 654  FOLATE 14.9  FERRITIN 259  TIBC 174*  IRON 27*  RETICCTPCT 2.6   Sepsis Labs: No results for input(s): PROCALCITON, LATICACIDVEN in the last 168 hours.  Recent Results (from the past  240 hour(s))  Respiratory Panel by PCR     Status: None   Collection Time: 11/06/17  6:13 PM  Result Value Ref Range Status   Adenovirus NOT DETECTED NOT DETECTED Final   Coronavirus 229E NOT DETECTED NOT DETECTED Final   Coronavirus HKU1 NOT DETECTED NOT DETECTED Final   Coronavirus NL63 NOT DETECTED NOT DETECTED Final   Coronavirus OC43 NOT DETECTED NOT DETECTED Final   Metapneumovirus NOT DETECTED NOT DETECTED Final   Rhinovirus / Enterovirus NOT DETECTED NOT DETECTED Final   Influenza A NOT DETECTED NOT DETECTED Final   Influenza B NOT DETECTED NOT DETECTED Final   Parainfluenza Virus 1 NOT DETECTED NOT DETECTED Final   Parainfluenza Virus 2 NOT DETECTED NOT DETECTED Final   Parainfluenza Virus 3 NOT DETECTED NOT DETECTED Final   Parainfluenza Virus 4 NOT DETECTED NOT DETECTED Final   Respiratory Syncytial Virus NOT DETECTED NOT DETECTED Final   Bordetella pertussis NOT DETECTED NOT DETECTED Final   Chlamydophila pneumoniae NOT DETECTED NOT DETECTED Final   Mycoplasma pneumoniae NOT DETECTED NOT DETECTED Final    Comment: Performed at [facility], [place] [address] [place], [place] [address]  MRSA PCR Screening     Status: None   Collection Time: 11/07/17 12:34 AM  Result Value Ref Range Status   MRSA by PCR NEGATIVE NEGATIVE Final    Comment:        The GeneXpert MRSA Assay (FDA approved for NASAL specimens only), is one component of a comprehensive MRSA colonization surveillance program. It is not intended to diagnose MRSA infection nor to guide or monitor treatment for MRSA infections.          Radiology Studies: Dg Chest 2 View  Result Date: 11/07/2017 CLINICAL DATA:  Cough, shortness of Breath EXAM: CHEST  2 VIEW COMPARISON:  11/06/2017 FINDINGS: Right Port-A-Cath remains in place, unchanged. Heart is normal size. Patchy bilateral airspace opacities are again noted, unchanged. Small bilateral effusions, best seen on the lateral view. IMPRESSION: Stable patchy bilateral airspace disease. Small layering effusions bilaterally. Electronically Signed   By: Rolm Baptise M.D.   On: 11/07/2017 10:40   Dg Chest 2 View  Result Date: 11/06/2017 CLINICAL DATA:  Weakness, history of lung carcinoma with metastatic disease EXAM: CHEST  2 VIEW COMPARISON:  05/12/2017 CT of the chest FINDINGS: Cardiac shadow is stable. The lungs are again well aerated with some chronic changes and elevation of the right hemidiaphragm. Small right-sided pleural effusion is noted which appears new from the prior CT patchy densities are noted in the left lung which correspond to known lung lesions. No focal confluent infiltrate is seen. Small left pleural effusion is noted on the lateral film. IMPRESSION: Small bilateral pleural effusions right greater than left. There may be some underlying infiltrative changes on the right. Stable vague densities corresponding to known left-sided parenchymal lesions. Electronically Signed   By: Inez Catalina M.D.   On: 11/06/2017 15:07   Dg Abd 2 Views  Result Date: 11/06/2017 CLINICAL DATA:  77 year old female with  a history weakness EXAM: ABDOMEN - 2 VIEW COMPARISON:  05/25/2014 FINDINGS: Gas within stomach, small bowel, colon. No abnormal distention. Decubitus image demonstrates no significant air-fluid levels. No radiopaque foreign body. No unexpected calcification. No unexpected soft tissue density. Calcified fibroids. Multilevel degenerative changes of the spine. No acute displaced fracture. Vascular calcifications. IMPRESSION: Nonobstructive bowel gas pattern. Electronically Signed   By: Corrie Mckusick D.O.   On: 11/06/2017 15:01  Scheduled Meds: . amLODipine  10 mg Oral Daily  . budesonide (PULMICORT) nebulizer solution  0.5 mg Nebulization BID  . clonazePAM  1 mg Oral QHS  . cyanocobalamin  1,000 mcg Intramuscular Q30 days  . dorzolamide-timolol  2 drop Both Eyes BID  . enoxaparin (LOVENOX) injection  90 mg Subcutaneous Q24H  . feeding supplement (ENSURE ENLIVE)  237 mL Oral BID BM  . HYDROcodone-homatropine  5 mL Oral TID  . ipratropium  0.5 mg Nebulization TID  . levalbuterol  0.63 mg Nebulization TID  . levothyroxine  88 mcg Oral QAC breakfast  . lisinopril  40 mg Oral Daily  . potassium chloride  40 mEq Oral Once  . sodium chloride flush  3 mL Intravenous Q12H  . vancomycin  125 mg Oral QID   Continuous Infusions: . sodium chloride 100 mL/hr at 11/07/17 0420  . potassium PHOSPHATE IVPB (mmol) 30 mmol (11/07/17 1122)     LOS: 1 day    Time spent: 40 mins    Irine Seal, MD [facility] Pager [phone]  If 7PM-7AM, please contact night-coverage [email or web address] Password [facility] 11/07/2017, 12:19 PM

## 2017-11-08 LAB — CBC
HCT: 31.5 % — ABNORMAL LOW (ref 36.0–46.0)
Hemoglobin: 9.9 g/dL — ABNORMAL LOW (ref 12.0–15.0)
MCH: 26.9 pg (ref 26.0–34.0)
MCHC: 31.4 g/dL (ref 30.0–36.0)
MCV: 85.6 fL (ref 78.0–100.0)
PLATELETS: 295 10*3/uL (ref 150–400)
RBC: 3.68 MIL/uL — ABNORMAL LOW (ref 3.87–5.11)
RDW: 19.3 % — ABNORMAL HIGH (ref 11.5–15.5)
WBC: 15.5 10*3/uL — AB (ref 4.0–10.5)

## 2017-11-08 LAB — EXPECTORATED SPUTUM ASSESSMENT W REFEX TO RESP CULTURE

## 2017-11-08 LAB — BASIC METABOLIC PANEL
Anion gap: 5 (ref 5–15)
BUN: 8 mg/dL (ref 6–20)
CALCIUM: 7.9 mg/dL — AB (ref 8.9–10.3)
CO2: 24 mmol/L (ref 22–32)
Chloride: 113 mmol/L — ABNORMAL HIGH (ref 101–111)
Creatinine, Ser: 0.62 mg/dL (ref 0.44–1.00)
Glucose, Bld: 97 mg/dL (ref 65–99)
Potassium: 4 mmol/L (ref 3.5–5.1)
SODIUM: 142 mmol/L (ref 135–145)

## 2017-11-08 LAB — URINE CULTURE

## 2017-11-08 LAB — PHOSPHORUS: Phosphorus: 2.3 mg/dL — ABNORMAL LOW (ref 2.5–4.6)

## 2017-11-08 LAB — EXPECTORATED SPUTUM ASSESSMENT W GRAM STAIN, RFLX TO RESP C

## 2017-11-08 LAB — HIV ANTIBODY (ROUTINE TESTING W REFLEX): HIV SCREEN 4TH GENERATION: NONREACTIVE

## 2017-11-08 MED ORDER — LEVALBUTEROL HCL 1.25 MG/0.5ML IN NEBU
1.2500 mg | INHALATION_SOLUTION | Freq: Two times a day (BID) | RESPIRATORY_TRACT | Status: DC
  Administered 2017-11-08 – 2017-11-11 (×6): 1.25 mg via RESPIRATORY_TRACT
  Filled 2017-11-08 (×8): qty 0.5

## 2017-11-08 MED ORDER — LEVALBUTEROL HCL 0.63 MG/3ML IN NEBU
1.2500 mg | INHALATION_SOLUTION | Freq: Two times a day (BID) | RESPIRATORY_TRACT | Status: DC
  Filled 2017-11-08: qty 6

## 2017-11-08 MED ORDER — IPRATROPIUM BROMIDE 0.02 % IN SOLN
0.5000 mg | Freq: Two times a day (BID) | RESPIRATORY_TRACT | Status: DC
  Administered 2017-11-08 – 2017-11-12 (×7): 0.5 mg via RESPIRATORY_TRACT
  Filled 2017-11-08 (×9): qty 2.5

## 2017-11-08 MED ORDER — POTASSIUM PHOSPHATES 15 MMOLE/5ML IV SOLN
30.0000 mmol | Freq: Once | INTRAVENOUS | Status: AC
  Administered 2017-11-08: 30 mmol via INTRAVENOUS
  Filled 2017-11-08: qty 10

## 2017-11-08 MED ORDER — FUROSEMIDE 10 MG/ML IJ SOLN
20.0000 mg | Freq: Once | INTRAMUSCULAR | Status: AC
  Administered 2017-11-08: 20 mg via INTRAVENOUS
  Filled 2017-11-08: qty 2

## 2017-11-08 NOTE — Progress Notes (Signed)
OT Cancellation Note  Patient Details Name: [name] MRN: [id number] DOB: [birth year]   Cancelled Treatment:    Reason Eval/Treat Not Completed: Fatigue/lethargy limiting ability to participate;Patient declined.  OT and RN encouraged pt and explained benefits of getting OOB. Pt refused.    Kari Baars, [place]  Payton Mccallum D 11/08/2017, 3:22 PM

## 2017-11-08 NOTE — Progress Notes (Signed)
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 difficile colitis prior to admission.  Patient noted to have some electrolyte abnormalities.  Patient placed on IV fluids, metronidazole changed to oral vancomycin.  PT/OT.   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 difficile colitis: Per patient tested positive last week.   Anemia   Pressure injury of skin   Malnutrition of moderate degree  #1 generalized weakness Patient presented with generalized weakness which she stated had worsened over the past few weeks however started in October 2018.  Patient with metastatic lung cancer including spinal metastases being followed by oncology.  Patient moving extremities spontaneously.  Patient denies any bowel or bladder incontinence.  Patient also noted to have diarrhea and stated was recently diagnosed approximately a week ago with a C. difficile colitis.  Patient also now with a community-acquired pneumonia versus bronchitis. Patient still with generalized weakness however some improvement since admission.  Saline lock IV fluids.  Repeat C. difficile PCR was canceled.  Urine cultures with multiple species present.   Influenza PCR negative.  Respiratory virus panel negative.  Blood cultures pending.  Continue oral vancomycin and treat for approximately 10 days.  Patient also has been started on empiric IV Rocephin and azithromycin.  PT/OT.  Supportive care.   2.   Hypokalemia/hypophosphatemia Likely secondary to GI losses from diarrhea per patient.    Magnesium level at 1.9.  Potassium level today at 4.0.  Replete phosphate.   3.  Hypothyroidism TSH at 4.495.  Continue home dose Synthroid.  4.  Dehydration Improved.  Saline lock IV fluids.  5.  History of recently diagnosed DVT Lovenox per pharmacy.  6.  Acute bronchitis vs CAP Patient presented with a worsening cough of clear sputum.  Chest x-ray done on admission was negative for any acute infiltrate.  Repeat chest x-ray with stable patchy bilateral airspace disease.  Urine strep pneumococcus antigen and urine Legionella antigen pending.  Sputum Gram stain and cultures pending. Continue scheduled Hycodan, scheduled nebulizers, Pulmicort, Claritin, Flonase, PPI.  Continue empiric antibiotics of IV Rocephin and azithromycin.   Give a dose of Lasix 20 mg IV x1.  Follow.  7.  Anemia Patient without any overt bleeding.  Anemia panel consistent with anemia of chronic disease/deficiency anemia. Hemoglobin currently stable at 9.9. Transfusion threshold hemoglobin less than 7.  Follow.  8.  Probable C. difficile colitis Patient states was diagnosed a week ago with a C. difficile colitis.  Patient was on metronidazole prior to admission.    Metronidazole has been discontinued.  Consistency of stools improving daily.  Repeat C. difficile PCR was canceled.  Continue oral vancomycin and treat for total of 7-10 days.  Saline lock IV fluids.  Supportive care.    9.  Metastatic lung cancer Oncology has been informed of patient's admission via epic.  Outpatient follow-up with hematology/oncology.  #10 pressure injury stage I sacral Frequent turns.      DVT prophylaxis: Lovenox Code Status: Full Family Communication:  Patient.  No family at bedside. Disposition Plan: Skilled nursing facility when medically stable.    Consultants:   None  Procedures:   Chest x-ray 11/07/2017,  11/06/2017  Abdominal films 11/06/2017  Antimicrobials:   IV Rocephin 11/07/2017  IV azithromycin 11/07/2017   Subjective: Patient in bed.  States no significant change with her breathing or cough since yesterday.  No chest pain.    Objective: Vitals:   11/07/17 1430 11/07/17 2235 11/08/17 0455 11/08/17 0806  BP: 128/78 (!) 107/51 126/63   Pulse: 88 92 94   Resp: 18 20 (!) 22   Temp: 98.2 F (36.8 C) 98.6 F (37 C) 99.2 F (37.3 C)   TempSrc: Oral Oral Oral   SpO2: 94% 93% 94% 94%  Weight:      Height:        Intake/Output Summary (Last 24 hours) at 11/08/2017 1015 Last data filed at 11/08/2017 0530 Gross per 24 hour  Intake 360 ml  Output -  Net 360 ml   Filed Weights   11/06/17 1854  Weight: 59.8 kg (131 lb 13.4 oz)    Examination:  General exam: NAD  Respiratory system: Some scattered crackles some scattered coarse breath sounds.  No wheezing.  Respiratory effort normal. Cardiovascular system: Regular rate and rhythm no murmurs rubs or gallops.  No JVD.  1+ right lower extremity edema.  Gastrointestinal system: Abdomen is soft, nontender, nondistended, positive bowel sounds.  No hepatosplenomegaly.  Central nervous system: Alert and oriented. No focal neurological deficits. Extremities: Symmetric 5 x 5 power. Skin: No rashes, lesions or ulcers Psychiatry: Judgement and insight appear normal. Mood & affect appropriate.     Data Reviewed: I have personally reviewed following labs and imaging studies  CBC: Recent Labs  Lab 11/06/17 1144 11/07/17 0901 11/08/17 0500  WBC 14.1* 15.4* 15.5*  HGB 9.1* 10.4* 9.9*  HCT 29.3* 33.2* 31.5*  MCV 86.2 86.7 85.6  PLT 261 281 741   Basic Metabolic Panel: Recent Labs  Lab 11/06/17 1144 11/06/17 2000 11/07/17 0901 11/08/17 0500  NA 141  --  140 142  K 2.9*  --  3.5 4.0  CL 109  --  111 113*  CO2 24  --  22 24  GLUCOSE 88  --  88 97  BUN 9  --  7 8  CREATININE 0.70  --  0.67 0.62  CALCIUM 8.2*  --  8.1*  7.9*  MG 2.0 1.9  --   --   PHOS  --  2.3*  --  2.3*   GFR: Estimated Creatinine Clearance: 53 mL/min (by C-G formula based on SCr of 0.62 mg/dL). Liver Function Tests: Recent Labs  Lab 11/06/17 1144 11/07/17 0901  AST 11* 10*  ALT 6* 6*  ALKPHOS 102 111  BILITOT 0.3 0.4  PROT 5.4* 5.4*  ALBUMIN 2.7* 2.6*   No results for input(s): LIPASE, AMYLASE in the last 168 hours. No results for input(s): AMMONIA in the last 168 hours. Coagulation Profile: Recent Labs  Lab 11/07/17 0901  INR 1.20   Cardiac Enzymes: No results for input(s): CKTOTAL, CKMB, CKMBINDEX, TROPONINI in the last 168 hours. BNP (last 3 results) No results for input(s): PROBNP in the last 8760 hours. HbA1C: No results for input(s): HGBA1C in the last 72 hours. CBG: Recent Labs  Lab 11/06/17 1147  GLUCAP 88   Lipid Profile: No results for input(s): CHOL, HDL, LDLCALC, TRIG, CHOLHDL, LDLDIRECT in the last 72 hours. Thyroid Function Tests: Recent Labs  11/06/17 2000  TSH 4.495   Anemia Panel: Recent Labs    11/06/17 1621  VITAMINB12 654  FOLATE 14.9  FERRITIN 259  TIBC 174*  IRON 27*  RETICCTPCT 2.6   Sepsis Labs: No results for input(s): PROCALCITON, LATICACIDVEN in the last 168 hours.  Recent Results (from the past 240 hour(s))  Culture, blood (Routine X 2) w Reflex to ID Panel     Status: None (Preliminary result)   Collection Time: 11/06/17 11:30 AM  Result Value Ref Range Status   Specimen Description BLOOD BLOOD RIGHT ARM  Final   Special Requests   Final    BOTTLES DRAWN AEROBIC AND ANAEROBIC Blood Culture adequate volume   Culture   Final    NO GROWTH < 24 HOURS Performed at [facility], [address], [place], [place] [address]    Report Status PENDING  Incomplete  Culture, blood (Routine X 2) w Reflex to ID Panel     Status: None (Preliminary result)   Collection Time: 11/06/17 11:42 AM  Result Value Ref Range Status   Specimen Description BLOOD PORT  Final    Special Requests   Final    BOTTLES DRAWN AEROBIC AND ANAEROBIC Blood Culture adequate volume   Culture   Final    NO GROWTH < 24 HOURS Performed at [facility], [place] [address], [place], [place] [address]    Report Status PENDING  Incomplete  Respiratory Panel by PCR     Status: None   Collection Time: 11/06/17  6:13 PM  Result Value Ref Range Status   Adenovirus NOT DETECTED NOT DETECTED Final   Coronavirus 229E NOT DETECTED NOT DETECTED Final   Coronavirus HKU1 NOT DETECTED NOT DETECTED Final   Coronavirus NL63 NOT DETECTED NOT DETECTED Final   Coronavirus OC43 NOT DETECTED NOT DETECTED Final   Metapneumovirus NOT DETECTED NOT DETECTED Final   Rhinovirus / Enterovirus NOT DETECTED NOT DETECTED Final   Influenza A NOT DETECTED NOT DETECTED Final   Influenza B NOT DETECTED NOT DETECTED Final   Parainfluenza Virus 1 NOT DETECTED NOT DETECTED Final   Parainfluenza Virus 2 NOT DETECTED NOT DETECTED Final   Parainfluenza Virus 3 NOT DETECTED NOT DETECTED Final   Parainfluenza Virus 4 NOT DETECTED NOT DETECTED Final   Respiratory Syncytial Virus NOT DETECTED NOT DETECTED Final   Bordetella pertussis NOT DETECTED NOT DETECTED Final   Chlamydophila pneumoniae NOT DETECTED NOT DETECTED Final   Mycoplasma pneumoniae NOT DETECTED NOT DETECTED Final    Comment: Performed at [facility], [place] [address], [place], [place] [address]  Culture, Urine     Status: Abnormal   Collection Time: 11/06/17  6:14 PM  Result Value Ref Range Status   Specimen Description URINE, CLEAN CATCH  Final   Special Requests NONE  Final   Culture MULTIPLE SPECIES PRESENT, SUGGEST RECOLLECTION (A)  Final   Report Status 11/08/2017 FINAL  Final  MRSA PCR Screening     Status: None   Collection Time: 11/07/17 12:34 AM  Result Value Ref Range Status   MRSA by PCR NEGATIVE NEGATIVE Final    Comment:        The GeneXpert MRSA Assay (FDA approved for NASAL specimens only), is one component of  a comprehensive MRSA colonization surveillance program. It is not intended to diagnose MRSA infection nor to guide or monitor treatment for MRSA infections.   Culture, sputum-assessment     Status: None   Collection Time: 11/08/17  5:15 AM  Result Value Ref Range Status   Specimen Description SPUTUM  Final   Special Requests NONE  Final   Sputum evaluation THIS SPECIMEN IS ACCEPTABLE FOR SPUTUM CULTURE  Final   Report Status 11/08/2017 FINAL  Final         Radiology Studies: Dg Chest 2 View  Result Date: 11/07/2017 CLINICAL DATA:  Cough, shortness of Breath EXAM: CHEST  2 VIEW COMPARISON:  11/06/2017 FINDINGS: Right Port-A-Cath remains in place, unchanged. Heart is normal size. Patchy bilateral airspace opacities are again noted, unchanged. Small bilateral effusions, best seen on the lateral view. IMPRESSION: Stable patchy bilateral airspace disease. Small layering effusions bilaterally. Electronically Signed   By: Rolm Baptise M.D.   On: 11/07/2017 10:40   Dg Chest 2 View  Result Date: 11/06/2017 CLINICAL DATA:  Weakness, history of lung carcinoma with metastatic disease EXAM: CHEST  2 VIEW COMPARISON:  05/12/2017 CT of the chest FINDINGS: Cardiac shadow is stable. The lungs are again well aerated with some chronic changes and elevation of the right hemidiaphragm. Small right-sided pleural effusion is noted which appears new from the prior CT patchy densities are noted in the left lung which correspond to known lung lesions. No focal confluent infiltrate is seen. Small left pleural effusion is noted on the lateral film. IMPRESSION: Small bilateral pleural effusions right greater than left. There may be some underlying infiltrative changes on the right. Stable vague densities corresponding to known left-sided parenchymal lesions. Electronically Signed   By: Inez Catalina M.D.   On: 11/06/2017 15:07   Dg Abd 2 Views  Result Date: 11/06/2017 CLINICAL DATA:  77 year old female with a  history weakness EXAM: ABDOMEN - 2 VIEW COMPARISON:  05/25/2014 FINDINGS: Gas within stomach, small bowel, colon. No abnormal distention. Decubitus image demonstrates no significant air-fluid levels. No radiopaque foreign body. No unexpected calcification. No unexpected soft tissue density. Calcified fibroids. Multilevel degenerative changes of the spine. No acute displaced fracture. Vascular calcifications. IMPRESSION: Nonobstructive bowel gas pattern. Electronically Signed   By: Corrie Mckusick D.O.   On: 11/06/2017 15:01        Scheduled Meds: . amLODipine  10 mg Oral Daily  . budesonide (PULMICORT) nebulizer solution  0.5 mg Nebulization BID  . clonazePAM  1 mg Oral QHS  . cyanocobalamin  1,000 mcg Intramuscular Q30 days  . dorzolamide-timolol  2 drop Both Eyes BID  . enoxaparin (LOVENOX) injection  90 mg Subcutaneous Q24H  . feeding supplement (ENSURE ENLIVE)  237 mL Oral BID BM  . HYDROcodone-homatropine  5 mL Oral TID  . ipratropium  0.5 mg Nebulization BID  . levalbuterol  1.25 mg Nebulization BID  . levothyroxine  88 mcg Oral QAC breakfast  . lisinopril  40 mg Oral Daily  . sodium chloride flush  3 mL Intravenous Q12H  . vancomycin  125 mg Oral QID   Continuous Infusions: . azithromycin Stopped (11/07/17 2331)  . cefTRIAXone (ROCEPHIN)  IV 1 g (11/07/17 1823)     LOS: 2 days    Time spent: 35 mins    Irine Seal, MD [facility] Pager [phone]  If 7PM-7AM, please contact night-coverage [email or web address] Password [facility] 11/08/2017, 10:15 AM

## 2017-11-09 LAB — CBC WITH DIFFERENTIAL/PLATELET
Basophils Absolute: 0 10*3/uL (ref 0.0–0.1)
Basophils Relative: 0 %
EOS PCT: 1 %
Eosinophils Absolute: 0.2 10*3/uL (ref 0.0–0.7)
HEMATOCRIT: 33.2 % — AB (ref 36.0–46.0)
HEMOGLOBIN: 10.3 g/dL — AB (ref 12.0–15.0)
LYMPHS ABS: 0.9 10*3/uL (ref 0.7–4.0)
LYMPHS PCT: 6 %
MCH: 26.8 pg (ref 26.0–34.0)
MCHC: 31 g/dL (ref 30.0–36.0)
MCV: 86.5 fL (ref 78.0–100.0)
MONOS PCT: 8 %
Monocytes Absolute: 1.2 10*3/uL — ABNORMAL HIGH (ref 0.1–1.0)
NEUTROS ABS: 12.8 10*3/uL — AB (ref 1.7–7.7)
Neutrophils Relative %: 85 %
Platelets: 261 10*3/uL (ref 150–400)
RBC: 3.84 MIL/uL — AB (ref 3.87–5.11)
RDW: 19.2 % — AB (ref 11.5–15.5)
WBC: 15.1 10*3/uL — ABNORMAL HIGH (ref 4.0–10.5)

## 2017-11-09 LAB — BASIC METABOLIC PANEL
ANION GAP: 5 (ref 5–15)
BUN: 7 mg/dL (ref 6–20)
CHLORIDE: 107 mmol/L (ref 101–111)
CO2: 26 mmol/L (ref 22–32)
Calcium: 8.4 mg/dL — ABNORMAL LOW (ref 8.9–10.3)
Creatinine, Ser: 0.7 mg/dL (ref 0.44–1.00)
GFR calc Af Amer: >60 mL/min (ref >60)
GFR calc non Af Amer: >60 mL/min (ref >60)
GLUCOSE: 99 mg/dL (ref 65–99)
POTASSIUM: 4.6 mmol/L (ref 3.5–5.1)
Sodium: 138 mmol/L (ref 135–145)

## 2017-11-09 LAB — MAGNESIUM: Magnesium: 1.8 mg/dL (ref 1.7–2.4)

## 2017-11-09 LAB — PHOSPHORUS: Phosphorus: 3.1 mg/dL (ref 2.5–4.6)

## 2017-11-09 MED ORDER — FUROSEMIDE 10 MG/ML IJ SOLN
40.0000 mg | Freq: Once | INTRAMUSCULAR | Status: AC
  Administered 2017-11-09: 40 mg via INTRAVENOUS
  Filled 2017-11-09: qty 4

## 2017-11-09 MED ORDER — MIRTAZAPINE 15 MG PO TBDP
15.0000 mg | ORAL_TABLET | Freq: Every day | ORAL | Status: DC
  Administered 2017-11-09: 15 mg via ORAL
  Filled 2017-11-09 (×3): qty 1

## 2017-11-09 NOTE — Progress Notes (Signed)
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 difficile colitis prior to admission.  Patient noted to have some electrolyte abnormalities.  Patient placed on IV fluids, metronidazole changed to oral vancomycin.  PT/OT.   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 difficile colitis: Per patient tested positive last week.   Anemia   Pressure injury of skin   Malnutrition of moderate degree  #1 generalized weakness Patient presented with generalized weakness which she stated had worsened over the past few weeks however started in October 2018.  Patient with metastatic lung cancer including spinal metastases being followed by oncology.  Patient moving extremities spontaneously.  Patient denies any bowel or bladder incontinence.  Patient also noted to have diarrhea and stated was recently diagnosed approximately a week ago with a C. difficile colitis.  Patient also now with a community-acquired pneumonia versus bronchitis. Patient still with generalized weakness however some improvement since admission.  Saline lock IV fluids.  Repeat C. difficile PCR was canceled.  Urine cultures with multiple species present.   Influenza PCR negative.  Respiratory virus panel negative.  Blood cultures pending.  Continue oral vancomycin and treat for approximately 10 days.  Patient also has been started on empiric IV Rocephin and azithromycin.  PT/OT.  Supportive care.   2.   Hypokalemia/hypophosphatemia Likely secondary to GI losses from diarrhea per patient.    Magnesium level at 1.8.  Potassium level today at 4.6.  Replete phosphate.   3.  Hypothyroidism TSH at 4.495.  Continue home dose Synthroid.  4.  Dehydration Improved.  Saline lock IV fluids.  5.  History of recently diagnosed DVT Lovenox per pharmacy.  6.  Acute bronchitis vs CAP Patient presented with a worsening cough of clear sputum.  Chest x-ray done on admission was negative for any acute infiltrate.  Repeat chest x-ray with stable patchy bilateral airspace disease.  Urine strep pneumococcus antigen and urine Legionella antigen pending.  Sputum Gram stain and cultures pending. Continue scheduled Hycodan, scheduled nebulizers, Pulmicort, Claritin, Flonase, PPI.  Continue empiric antibiotics of IV Rocephin and azithromycin.  Could likely transition to oral antibiotics tomorrow.   Give a dose of Lasix 40 mg IV x1.  Follow.  7.  Anemia Patient without any overt bleeding.  Anemia panel consistent with anemia of chronic disease/deficiency anemia. Hemoglobin currently stable at 10.3. Transfusion threshold hemoglobin less than 7.  Follow.  8.  Probable C. difficile colitis Patient states was diagnosed a week ago with a C. difficile colitis.  Patient was on metronidazole prior to admission.    Metronidazole has been discontinued.  Consistency of stools improving daily.  Repeat C. difficile PCR was canceled.  Continue oral vancomycin and treat for total of 7-10 days.  Saline lock IV fluids.  Supportive care.    9.  Metastatic lung cancer Oncology has been informed of patient's admission via epic.  Outpatient follow-up with hematology/oncology.  #10 pressure injury stage I sacral Frequent turns.  DVT prophylaxis: Lovenox Code Status: Full Family Communication: Patient.  No family at bedside. Disposition Plan: Skilled nursing facility when medically stable hopefully in 1-2 days..     Consultants:   None  Procedures:   Chest x-ray 11/07/2017, 11/06/2017  Abdominal films 11/06/2017  Antimicrobials:   IV Rocephin 11/07/2017  IV azithromycin 11/07/2017   Subjective: Patient laying in bed.  Patient states feels a little bit better.  Still complaining of cough.  States shortness of breath improving.  Patient complaining of some swelling in the right upper extremity.  Per nursing patient with poor oral intake yesterday.  Parent nurse tech patient ate some breakfast today.  Objective: Vitals:   11/08/17 2342 11/09/17 0415 11/09/17 0902 11/09/17 0907  BP:      Pulse:      Resp:      Temp:      TempSrc:      SpO2: 94%  (!) 89% (!) 89%  Weight:  63.3 kg (139 lb 8.8 oz)    Height:        Intake/Output Summary (Last 24 hours) at 11/09/2017 1038 Last data filed at 11/08/2017 1700 Gross per 24 hour  Intake 360 ml  Output 400 ml  Net -40 ml   Filed Weights   11/06/17 1854 11/09/17 0415  Weight: 59.8 kg (131 lb 13.4 oz) 63.3 kg (139 lb 8.8 oz)    Examination:  General exam: NAD  Respiratory system: Some scattered crackles some scattered coarse breath sounds.  No wheezing.  Respiratory effort normal. Cardiovascular system: Regular rate and rhythm no murmurs rubs or gallops.  No JVD.  Trace-1+ right lower extremity edema.  Gastrointestinal system: Abdomen is nontender, nondistended, soft, positive bowel sounds.  No hepatosplenomegaly.  Central nervous system: Alert and oriented. No focal neurological deficits. Extremities: Symmetric 5 x 5 power. Skin: No rashes, lesions or ulcers Psychiatry: Judgement and insight appear normal. Mood & affect appropriate.     Data Reviewed: I have personally reviewed following labs and imaging studies  CBC: Recent Labs  Lab 11/06/17 1144 11/07/17 0901 11/08/17 0500 11/09/17 0400  WBC 14.1* 15.4* 15.5* 15.1*  NEUTROABS  --   --   --  12.8*  HGB 9.1* 10.4* 9.9* 10.3*  HCT 29.3* 33.2* 31.5* 33.2*  MCV 86.2 86.7 85.6  86.5  PLT 261 281 295 992   Basic Metabolic Panel: Recent Labs  Lab 11/06/17 1144 11/06/17 2000 11/07/17 0901 11/08/17 0500 11/09/17 0400  NA 141  --  140 142 138  K 2.9*  --  3.5 4.0 4.6  CL 109  --  111 113* 107  CO2 24  --  22 24 26   GLUCOSE 88  --  88 97 99  BUN 9  --  7 8 7   CREATININE 0.70  --  0.67 0.62 0.70  CALCIUM 8.2*  --  8.1* 7.9* 8.4*  MG 2.0 1.9  --   --  1.8  PHOS  --  2.3*  --  2.3* 3.1   GFR: Estimated Creatinine Clearance: 53 mL/min (by C-G formula based on SCr of 0.7 mg/dL). Liver Function Tests: Recent Labs  Lab 11/06/17 1144 11/07/17 0901  AST 11* 10*  ALT 6* 6*  ALKPHOS 102 111  BILITOT 0.3 0.4  PROT 5.4* 5.4*  ALBUMIN 2.7* 2.6*   No results for input(s): LIPASE, AMYLASE in the last 168 hours. No results for input(s): AMMONIA in the last 168 hours. Coagulation Profile: Recent Labs  Lab 11/07/17 0901  INR  1.20   Cardiac Enzymes: No results for input(s): CKTOTAL, CKMB, CKMBINDEX, TROPONINI in the last 168 hours. BNP (last 3 results) No results for input(s): PROBNP in the last 8760 hours. HbA1C: No results for input(s): HGBA1C in the last 72 hours. CBG: Recent Labs  Lab 11/06/17 1147  GLUCAP 88   Lipid Profile: No results for input(s): CHOL, HDL, LDLCALC, TRIG, CHOLHDL, LDLDIRECT in the last 72 hours. Thyroid Function Tests: Recent Labs    11/06/17 2000  TSH 4.495   Anemia Panel: Recent Labs    11/06/17 1621  VITAMINB12 654  FOLATE 14.9  FERRITIN 259  TIBC 174*  IRON 27*  RETICCTPCT 2.6   Sepsis Labs: No results for input(s): PROCALCITON, LATICACIDVEN in the last 168 hours.  Recent Results (from the past 240 hour(s))  Culture, blood (Routine X 2) w Reflex to ID Panel     Status: None (Preliminary result)   Collection Time: 11/06/17 11:30 AM  Result Value Ref Range Status   Specimen Description BLOOD BLOOD RIGHT ARM  Final   Special Requests   Final    BOTTLES DRAWN AEROBIC AND ANAEROBIC Blood Culture adequate  volume   Culture   Final    NO GROWTH 2 DAYS Performed at [facility], [address] [place], [place] [address]    Report Status PENDING  Incomplete  Culture, blood (Routine X 2) w Reflex to ID Panel     Status: None (Preliminary result)   Collection Time: 11/06/17 11:42 AM  Result Value Ref Range Status   Specimen Description BLOOD PORT  Final   Special Requests   Final    BOTTLES DRAWN AEROBIC AND ANAEROBIC Blood Culture adequate volume   Culture   Final    NO GROWTH 2 DAYS Performed at [facility], [place] [address] [place], [place] [address]    Report Status PENDING  Incomplete  Respiratory Panel by PCR     Status: None   Collection Time: 11/06/17  6:13 PM  Result Value Ref Range Status   Adenovirus NOT DETECTED NOT DETECTED Final   Coronavirus 229E NOT DETECTED NOT DETECTED Final   Coronavirus HKU1 NOT DETECTED NOT DETECTED Final   Coronavirus NL63 NOT DETECTED NOT DETECTED Final   Coronavirus OC43 NOT DETECTED NOT DETECTED Final   Metapneumovirus NOT DETECTED NOT DETECTED Final   Rhinovirus / Enterovirus NOT DETECTED NOT DETECTED Final   Influenza A NOT DETECTED NOT DETECTED Final   Influenza B NOT DETECTED NOT DETECTED Final   Parainfluenza Virus 1 NOT DETECTED NOT DETECTED Final   Parainfluenza Virus 2 NOT DETECTED NOT DETECTED Final   Parainfluenza Virus 3 NOT DETECTED NOT DETECTED Final   Parainfluenza Virus 4 NOT DETECTED NOT DETECTED Final   Respiratory Syncytial Virus NOT DETECTED NOT DETECTED Final   Bordetella pertussis NOT DETECTED NOT DETECTED Final   Chlamydophila pneumoniae NOT DETECTED NOT DETECTED Final   Mycoplasma pneumoniae NOT DETECTED NOT DETECTED Final    Comment: Performed at [facility], [place] [address] [place], [place] [address]  Culture, Urine     Status: Abnormal   Collection Time: 11/06/17  6:14 PM  Result Value Ref Range Status   Specimen Description URINE, CLEAN CATCH  Final   Special Requests NONE  Final   Culture  MULTIPLE SPECIES PRESENT, SUGGEST RECOLLECTION (A)  Final   Report Status 11/08/2017 FINAL  Final  MRSA PCR Screening     Status: None   Collection Time: 11/07/17 12:34  AM  Result Value Ref Range Status   MRSA by PCR NEGATIVE NEGATIVE Final    Comment:        The GeneXpert MRSA Assay (FDA approved for NASAL specimens only), is one component of a comprehensive MRSA colonization surveillance program. It is not intended to diagnose MRSA infection nor to guide or monitor treatment for MRSA infections.   Culture, sputum-assessment     Status: None   Collection Time: 11/08/17  5:15 AM  Result Value Ref Range Status   Specimen Description SPUTUM  Final   Special Requests NONE  Final   Sputum evaluation THIS SPECIMEN IS ACCEPTABLE FOR SPUTUM CULTURE  Final   Report Status 11/08/2017 FINAL  Final  Culture, respiratory (NON-Expectorated)     Status: None (Preliminary result)   Collection Time: 11/08/17  5:15 AM  Result Value Ref Range Status   Specimen Description SPUTUM  Final   Special Requests NONE Reflexed from [id number]  Final   Gram Stain   Final    ABUNDANT WBC PRESENT, PREDOMINANTLY PMN NO SQUAMOUS EPITHELIAL CELLS SEEN RARE GRAM POSITIVE COCCI IN PAIRS Performed at [facility], [address] [place], [place] [address]    Culture PENDING  Incomplete   Report Status PENDING  Incomplete         Radiology Studies: No results found.      Scheduled Meds: . amLODipine  10 mg Oral Daily  . budesonide (PULMICORT) nebulizer solution  0.5 mg Nebulization BID  . clonazePAM  1 mg Oral QHS  . cyanocobalamin  1,000 mcg Intramuscular Q30 days  . dorzolamide-timolol  2 drop Both Eyes BID  . enoxaparin (LOVENOX) injection  90 mg Subcutaneous Q24H  . feeding supplement (ENSURE ENLIVE)  237 mL Oral BID BM  . HYDROcodone-homatropine  5 mL Oral TID  . ipratropium  0.5 mg Nebulization BID  . levalbuterol  1.25 mg Nebulization BID  . levothyroxine  88 mcg Oral QAC breakfast   . lisinopril  40 mg Oral Daily  . mirtazapine  15 mg Oral QHS  . sodium chloride flush  3 mL Intravenous Q12H  . vancomycin  125 mg Oral QID   Continuous Infusions: . azithromycin Stopped (11/08/17 1706)  . cefTRIAXone (ROCEPHIN)  IV Stopped (11/08/17 1735)     LOS: 3 days    Time spent: 35 mins    Irine Seal, MD [facility] Pager [phone]  If 7PM-7AM, please contact night-coverage [email or web address] Password [facility] 11/09/2017, 10:38 AM

## 2017-11-09 NOTE — Progress Notes (Signed)
PHARMACY NOTE -  Lovenox  Pharmacy has been assisting with dosing of Lovenox for hx DVT. Dosage remains stable at 900mg  sq daily and need for further dosage adjustment appears unlikely at present.   Renal function, CBC stable.   Will sign off at this time.  Please reconsult if a change in clinical status warrants re-evaluation of dosage.  Netta Cedars, PharmD, BCPS 11/09/2017@9 :45 AM

## 2017-11-09 NOTE — NC FL2 (Signed)
[place] LEVEL OF CARE SCREENING TOOL     IDENTIFICATION  Patient Name: [name] Birthdate: 12/20/1940 Sex: female Admission Date (Current Location): 11/06/2017  [facility] and [place] Number:  Herbalist and Address:  [facility],  [place], [place]      Provider Number: [id number]  Attending Physician Name and Address:  Eugenie Filler, MD  Relative Name and Phone Number:       Current Level of Care: Hospital Recommended Level of Care: [place] Prior Approval Number:    Date Approved/Denied:   PASRR Number: [id number]  Discharge Plan: SNF    Current Diagnoses: Patient Active Problem List   Diagnosis Date Noted  . Pressure injury of skin 11/07/2017  . Malnutrition of moderate degree 11/07/2017  . Weakness 11/06/2017  . Bronchitis, acute 11/06/2017  . C. difficile colitis: Per patient tested positive last week. 11/06/2017  . Anemia 11/06/2017  . Clostridium difficile diarrhea   . Generalized weakness   . DVT (deep venous thrombosis) ([place]) 09/03/2017  . Acute bilateral low back pain with bilateral sciatica   . Hypothyroidism   . Dehydration   . AKI (acute kidney injury) ([place])   . UTI (urinary tract infection) 07/15/2017  . Metastasis to spinal column ([place]) 07/14/2017  . Muscle weakness (generalized) 06/06/2017  . Multiple falls 06/06/2017  . DDD (degenerative disc disease), lumbosacral 05/15/2017  . Incontinence of feces 04/23/2017  . Vaginal atrophy 04/23/2017  . Voiding dysfunction 04/23/2017  . Recurrent UTI 04/23/2017  . Dysuria 03/04/2017  . Hypertension 10/29/2016  . Encounter for antineoplastic immunotherapy 02/14/2016  . Chronic fatigue 02/14/2016  . Port catheter in place 02/09/2016  . Urine frequency 07/06/2014  . Diarrhea 05/24/2014  . Nausea and vomiting 05/24/2014  . Sepsis ([place]) 05/24/2014  . Hypothermia 05/24/2014  . Hypokalemia 05/24/2014  . Nausea vomiting  and diarrhea 05/24/2014  . Proteinuria 04/28/2012  . Vitamin B12 deficiency 03/17/2012  . Deficiency anemia 12/02/2011  . 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 Multinodular goiter 11/29/2011  . Numbness 11/29/2011  . Acute pulmonary embolism ([place]) 11/04/2011  . Acute pulmonary embolism ([place]) 11/04/2011  . Hip pain 09/02/2011  . Foot pain 09/02/2011  . Bilateral lung cancer ([place]) 03/08/2011    Orientation RESPIRATION BLADDER Height & Weight     Self, Time, Situation, Place  Normal Incontinent Weight: 139 lb 8.8 oz (63.3 kg) Height:  5\' 5"  (165.1 cm)  BEHAVIORAL SYMPTOMS/MOOD NEUROLOGICAL BOWEL NUTRITION STATUS      Continent Diet(see DC summary)  AMBULATORY STATUS COMMUNICATION OF NEEDS Skin   Limited Assist Verbally PU Stage and Appropriate Care PU Stage 1 Dressing: (sacrum- foam dressing changes)                     Personal Care Assistance Level of Assistance  Bathing, Dressing Bathing Assistance: Maximum assistance   Dressing Assistance: Maximum assistance     Functional Limitations Info             SPECIAL CARE FACTORS FREQUENCY  PT (By licensed PT), OT (By licensed OT)     PT Frequency: 5/wk OT Frequency: 5/wk            Contractures      Additional Factors Info  Code Status, Allergies, Psychotropic, Isolation Precautions Code Status Info: FULL Allergies Info: Cymbalta Duloxetine Hcl, Fish Allergy, Amitriptyline, Codeine, Mirabegron, Solifenacin, Sulfa Antibiotics Psychotropic Info: klonopin   Isolation Precautions Info: Cont/Ent pre  Current Medications (11/09/2017):  This is the current hospital active medication list Current Facility-Administered Medications  Medication Dose Route Frequency Provider Last Rate Last Dose  . acetaminophen (TYLENOL) tablet 650 mg  650 mg Oral Q6H PRN Eugenie Filler, MD   650 mg at 11/07/17 1660   Or  . acetaminophen (TYLENOL) suppository 650 mg  650 mg Rectal Q6H PRN Eugenie Filler, MD       . alum & mag hydroxide-simeth (MAALOX/MYLANTA) 200-200-20 MG/5ML suspension 30 mL  30 mL Oral Q6H PRN Eugenie Filler, MD      . amLODipine (NORVASC) tablet 10 mg  10 mg Oral Daily Eugenie Filler, MD   10 mg at 11/09/17 1027  . azithromycin (ZITHROMAX) 500 mg in dextrose 5 % 250 mL IVPB  500 mg Intravenous Q24H Eugenie Filler, MD   Stopped at 11/08/17 1706  . budesonide (PULMICORT) nebulizer solution 0.5 mg  0.5 mg Nebulization BID Eugenie Filler, MD   0.5 mg at 11/09/17 0902  . cefTRIAXone (ROCEPHIN) 1 g in dextrose 5 % 50 mL IVPB  1 g Intravenous Q24H Eugenie Filler, MD   Stopped at 11/08/17 1735  . clonazePAM (KLONOPIN) tablet 1 mg  1 mg Oral QHS Eugenie Filler, MD   1 mg at 11/08/17 2327  . cyanocobalamin ((VITAMIN B-12)) injection 1,000 mcg  1,000 mcg Intramuscular Q30 days Eugenie Filler, MD   1,000 mcg at 11/07/17 1104  . dorzolamide-timolol (COSOPT) 22.3-6.8 MG/ML ophthalmic solution 2 drop  2 drop Both Eyes BID Eugenie Filler, MD   2 drop at 11/08/17 2328  . enoxaparin (LOVENOX) injection 90 mg  90 mg Subcutaneous Q24H Lynelle Doctor, RPH   90 mg at 11/08/17 2327  . feeding supplement (ENSURE ENLIVE) (ENSURE ENLIVE) liquid 237 mL  237 mL Oral BID BM Eugenie Filler, MD   237 mL at 11/09/17 1031  . fluticasone (FLONASE) 50 MCG/ACT nasal spray 1 spray  1 spray Each Nare Daily PRN Eugenie Filler, MD      . guaiFENesin-codeine 100-10 MG/5ML solution 10 mL  10 mL Oral Q6H PRN Eugenie Filler, MD      . HYDROcodone-homatropine [facility]) 5-1.5 MG/5ML syrup 5 mL  5 mL Oral TID Eugenie Filler, MD   5 mL at 11/09/17 1026  . ipratropium (ATROVENT) nebulizer solution 0.5 mg  0.5 mg Nebulization Q2H PRN Eugenie Filler, MD      . ipratropium (ATROVENT) nebulizer solution 0.5 mg  0.5 mg Nebulization BID Eugenie Filler, MD   0.5 mg at 11/09/17 0902  . levalbuterol (XOPENEX) nebulizer solution 0.63 mg  0.63 mg Nebulization Q2H PRN Eugenie Filler, MD       . levalbuterol Penne Lash) nebulizer solution 1.25 mg  1.25 mg Nebulization BID Eugenie Filler, MD   1.25 mg at 11/09/17 0902  . levothyroxine (SYNTHROID, LEVOTHROID) tablet 88 mcg  88 mcg Oral QAC breakfast Eugenie Filler, MD   88 mcg at 11/09/17 1026  . lidocaine-prilocaine (EMLA) cream 1 application  1 application Topical PRN Eugenie Filler, MD      . lisinopril (PRINIVIL,ZESTRIL) tablet 40 mg  40 mg Oral Daily Eugenie Filler, MD   40 mg at 11/09/17 1027  . mirtazapine (REMERON SOL-TAB) disintegrating tablet 15 mg  15 mg Oral QHS Eugenie Filler, MD      . ondansetron [facility]) tablet 4 mg  4 mg Oral Q6H PRN Eugenie Filler,  MD       Or  . ondansetron (ZOFRAN) injection 4 mg  4 mg Intravenous Q6H PRN Eugenie Filler, MD      . sodium chloride flush (NS) 0.9 % injection 3 mL  3 mL Intravenous Q12H Eugenie Filler, MD   3 mL at 11/08/17 2328  . traMADol (ULTRAM) tablet 50 mg  50 mg Oral Q6H PRN Eugenie Filler, MD      . vancomycin (VANCOCIN) 50 mg/mL oral solution 125 mg  125 mg Oral QID Eugenie Filler, MD   125 mg at 11/09/17 1027     Discharge Medications: Please see discharge summary for a list of discharge medications.  Relevant Imaging Results:  Relevant Lab Results:   Additional Information SS# [id number]  Jorge Ny, [place]

## 2017-11-10 ENCOUNTER — Inpatient Hospital Stay (HOSPITAL_COMMUNITY): Payer: Medicare Other

## 2017-11-10 LAB — BASIC METABOLIC PANEL
ANION GAP: 6 (ref 5–15)
BUN: 8 mg/dL (ref 6–20)
CO2: 26 mmol/L (ref 22–32)
Calcium: 8.4 mg/dL — ABNORMAL LOW (ref 8.9–10.3)
Chloride: 105 mmol/L (ref 101–111)
Creatinine, Ser: 0.69 mg/dL (ref 0.44–1.00)
GFR calc Af Amer: >60 mL/min (ref >60)
GLUCOSE: 93 mg/dL (ref 65–99)
POTASSIUM: 3.9 mmol/L (ref 3.5–5.1)
Sodium: 137 mmol/L (ref 135–145)

## 2017-11-10 LAB — CBC
HEMATOCRIT: 31.9 % — AB (ref 36.0–46.0)
Hemoglobin: 10.2 g/dL — ABNORMAL LOW (ref 12.0–15.0)
MCH: 27.3 pg (ref 26.0–34.0)
MCHC: 32 g/dL (ref 30.0–36.0)
MCV: 85.3 fL (ref 78.0–100.0)
PLATELETS: 237 10*3/uL (ref 150–400)
RBC: 3.74 MIL/uL — AB (ref 3.87–5.11)
RDW: 19.5 % — ABNORMAL HIGH (ref 11.5–15.5)
WBC: 15.5 10*3/uL — AB (ref 4.0–10.5)

## 2017-11-10 LAB — CULTURE, RESPIRATORY W GRAM STAIN

## 2017-11-10 LAB — STREP PNEUMONIAE URINARY ANTIGEN: STREP PNEUMO URINARY ANTIGEN: NEGATIVE

## 2017-11-10 LAB — CULTURE, RESPIRATORY

## 2017-11-10 MED ORDER — AMOXICILLIN-POT CLAVULANATE 875-125 MG PO TABS
1.0000 | ORAL_TABLET | Freq: Two times a day (BID) | ORAL | Status: DC
  Administered 2017-11-10 – 2017-11-12 (×5): 1 via ORAL
  Filled 2017-11-10 (×5): qty 1

## 2017-11-10 NOTE — Evaluation (Signed)
Occupational Therapy Evaluation Patient Details Name: [name] MRN: [id number] DOB: [birth year] Today's Date: 11/10/2017    History of Present Illness 77 yo female with onset of weakness and has recurrent lung CA with bronchitis, mets. PMHx:  PE, DJD lumbar spine, HTN, hip pain, ALF resident    Clinical Impression   This 77 yo female admitted and underwent above presents to acute OT with generalized weakness, decreased sitting and standing balance, fear of falling and pain all affecting her ability to care for herself at ALF. She will benefit from acute OT with follow up OT at SNF to work back towards her PLOF.    Follow Up Recommendations  SNF;Supervision/Assistance - 24 hour    Equipment Recommendations  Other (comment)(TBD next venue)       Precautions / Restrictions Precautions Precautions: Fall Restrictions Weight Bearing Restrictions: No      Mobility Bed Mobility Overal bed mobility: Needs Assistance Bed Mobility: Supine to Sit;Sit to Supine     Supine to sit: Max assist;HOB elevated(use of rail) Sit to supine: Mod assist;HOB elevated   General bed mobility comments: use of rail; VCs for sequencing  Transfers Overall transfer level: Needs assistance Equipment used: 1 person hand held assist Transfers: Sit to/from [facility] Sit to Stand: Mod assist Stand pivot transfers: Mod assist       General transfer comment: cues hand placement    Balance Overall balance assessment: History of Falls;Needs assistance Sitting-balance support: Feet supported;Bilateral upper extremity supported Sitting balance-Leahy Scale: Poor Sitting balance - Comments: tendency for posterior lean   Standing balance support: Bilateral upper extremity supported;During functional activity Standing balance-Leahy Scale: Poor Standing balance comment: pt is fearful of falling when up on her feet                           ADL either performed or assessed  with clinical judgement   ADL Overall ADL's : Needs assistance/impaired Eating/Feeding: Set up;Bed level   Grooming: Set up;Bed level   Upper Body Bathing: Set up;Bed level   Lower Body Bathing: Maximal assistance Lower Body Bathing Details (indicate cue type and reason): Mod A sit<>stand Upper Body Dressing : Maximal assistance;Bed level   Lower Body Dressing: Total assistance Lower Body Dressing Details (indicate cue type and reason): Mod A sit<>stand Toilet Transfer: Moderate assistance;Stand-pivot;BSC   Toileting- Clothing Manipulation and Hygiene: Moderate assistance Toileting - Clothing Manipulation Details (indicate cue type and reason): Mod A sit<>stand             Vision Patient Visual Report: No change from baseline              Pertinent Vitals/Pain Pain Assessment: Faces Pain Score: 4  Pain Location: left ankle Pain Descriptors / Indicators: Sore Pain Intervention(s): Monitored during session;Repositioned     Hand Dominance Right   Extremity/Trunk Assessment Upper Extremity Assessment Upper Extremity Assessment: Generalized weakness           Communication Communication Communication: No difficulties   Cognition Arousal/Alertness: Awake/alert Behavior During Therapy: WFL for tasks assessed/performed Overall Cognitive Status: Within Functional Limits for tasks assessed                                       Home Living Family/patient expects to be discharged to:: Skilled nursing facility  Home Equipment: [place] - 2 wheels;Cane - single point;Shower seat          Prior Functioning/Environment Level of Independence: Independent with assistive device(s)        Comments: used RW at ALF        OT Problem List: Decreased strength;Decreased activity tolerance;Pain;Impaired balance (sitting and/or standing);Decreased knowledge of use of DME or AE      OT Treatment/Interventions:  Self-care/ADL training;Balance training;Therapeutic activities;DME and/or AE instruction;Patient/family education    OT Goals(Current goals can be found in the care plan section) Acute Rehab OT Goals Patient Stated Goal: to go to rehab and then back to ALF OT Goal Formulation: With patient Time For Goal Achievement: 11/24/17 Potential to Achieve Goals: Good  OT Frequency: Min 2X/week   Barriers to D/C: Decreased caregiver support             AM-PAC PT "6 Clicks" Daily Activity     Outcome Measure Help from another person eating meals?: A Little Help from another person taking care of personal grooming?: A Little Help from another person toileting, which includes using toliet, bedpan, or urinal?: A Lot Help from another person bathing (including washing, rinsing, drying)?: A Lot Help from another person to put on and taking off regular upper body clothing?: A Lot Help from another person to put on and taking off regular lower body clothing?: Total 6 Click Score: 13   End of Session Equipment Utilized During Treatment: Gait belt Nurse Communication: (NT: pt needs new purewick and she urinated in [facility])  Activity Tolerance: Patient limited by fatigue Patient left: in bed;with call bell/phone within reach;with bed alarm set  OT Visit Diagnosis: Unsteadiness on feet (R26.81);Other abnormalities of gait and mobility (R26.89);History of falling (Z91.81);Pain Pain - Right/Left: Left Pain - part of body: Ankle and joints of foot                Time: 0727-0758 OT Time Calculation (min): 31 min Charges:  OT General Charges $OT Visit: 1 Visit OT Evaluation $OT Eval Moderate Complexity: 1 Mod OT Treatments $Self Care/Home Management : 8-22 mins Golden Circle, OTR/L [phone] 11/10/2017

## 2017-11-10 NOTE — Care Management Important Message (Addendum)
Important Message  Patient Details IM Letter given to Nora/Case Manager to present to the Patient Name: [name] MRN: [id number] Date of Birth: [birth year]   Medicare Important Message Given:  Yes    Kerin Salen 11/10/2017, 12:53 [place] Message  Patient Details  Name: [name] MRN: [id number] Date of Birth: [birth year]   Medicare Important Message Given:  Yes    Kerin Salen 11/10/2017, 12:48 [place] Message  Patient Details  Name: [name] MRN: [id number] Date of Birth: 02-05-1941   Medicare Important Message Given:  Yes    Kerin Salen 11/10/2017, 12:48 PM

## 2017-11-10 NOTE — Progress Notes (Signed)
CSW following to assist with disposition. Pt resides at [place]. Currently, SNF is recommended at [facility] ([facility]). Pt is agreeable but notes she would prefer to return to [facility] directly if at all possible. CSW has been in touch with [facility] liaison- current level of care needed is SNF but will keep following. Spoke with pt's daughter as well- she agrees with pt- prefers return to [facility] but understanding that if SNF needed prior to returning to ALF, she will assist with that plan. Prefers [facility]- no bed offer made as of yet. Will provide other bed offers. Pt will need prior authorization from [facility] Medicare in order to admit to SNF.  Daughter shared she feels pt may be hesitant to participate in therapy both here in hospital and once discharged. CSW discussed with patient the importance of attempting to participate so that progress can be documented and pt can work toward her goals. Pt expressed understanding.   Will follow and assist- likely DC to SNF prior to returning directly home to ALF.   Sharren Bridge, MSW, LCSW Clinical Social Work 11/10/2017 [phone]

## 2017-11-10 NOTE — Progress Notes (Signed)
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 difficile colitis prior to admission.  Patient noted to have some electrolyte abnormalities.  Patient placed on IV fluids, metronidazole changed to oral vancomycin.  PT/OT.   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 difficile colitis: Per patient tested positive last week.   Anemia   Pressure injury of skin   Malnutrition of moderate degree  #1 generalized weakness Patient presented with generalized weakness which she stated had worsened over the past few weeks however started in October 2018.  Patient with metastatic lung cancer including spinal metastases being followed by oncology.  Patient moving extremities spontaneously.  Patient denies any bowel or bladder incontinence.  Patient also noted to have diarrhea and stated was recently diagnosed approximately a week ago with a C. difficile colitis.  Patient also now with a community-acquired pneumonia versus bronchitis. Patient still with generalized weakness however some improvement since admission.  Patient with complaints of left lower extremity pain and weakness.  Patient with history of metastatic cancer with metastases to the spine.  Check MRI of the L-spine. Repeat C. difficile PCR was canceled.  Urine cultures with multiple species present.   Influenza PCR negative.  Respiratory virus panel negative.  Blood cultures pending with no growth to date.  Continue oral vancomycin and  treat for approximately 7-10 days.  Change IV Rocephin and azithromycin to oral Augmentin.  PT/OT.  Supportive care.   2.  Hypokalemia/hypophosphatemia Likely secondary to GI losses from diarrhea per patient.    Magnesium level at 1.8.  Potassium level today at 3.9.  Repleted phosphate.   3.  Hypothyroidism TSH at 4.495.  Continue current regimen of Synthroid.  4.  Dehydration Improved.  Saline lock IV fluids.  5.  History of recently diagnosed DVT Lovenox per pharmacy.  6.  Acute bronchitis vs CAP Patient presented with a worsening cough of clear sputum.  Chest x-ray done on admission was negative for any acute infiltrate.  Repeat chest x-ray with stable patchy bilateral airspace disease.  Urine strep pneumococcus antigen and urine Legionella antigen pending.  Sputum Gram stain and cultures with few Candida tropicalis.  Continue scheduled Hycodan, scheduled nebulizers, Pulmicort, Claritin, Flonase, PPI.  Change empiric antibiotics of IV Rocephin and azithromycin to oral Augmentin.  We will add Diflucan. Follow.  7.  Anemia Patient without any overt bleeding.  Anemia panel consistent with anemia of chronic disease/deficiency anemia. Hemoglobin currently stable at 10.2. Transfusion threshold hemoglobin less than 7.  Follow.  8.  Probable C. difficile colitis Patient states was diagnosed a week ago with a C. difficile colitis.  Patient was on metronidazole prior to admission.    Metronidazole has been discontinued.  Consistency of stools improving daily.  Repeat C. difficile PCR was canceled.  Continue oral vancomycin and treat for total of 7-10 days.  Saline lock IV fluids.  Supportive care.    9.  Metastatic lung cancer Oncology has been informed of patient's admission via epic.  MRI of the L-spine as patient with complaints of left lower extremity weakness and pain.   Patient with noted history of spinal metastases.  Outpatient follow-up with hematology/oncology.  #10 pressure  injury stage I sacral Frequent turns.      DVT prophylaxis: Lovenox Code Status: Full Family Communication: Patient.  No family at bedside. Disposition Plan: Skilled nursing facility when medically stable hopefully in 1-2 days..    Consultants:   None  Procedures:   Chest x-ray 11/07/2017, 11/06/2017  Abdominal films 11/06/2017   MRI L-spine pending  Antimicrobials:   IV Rocephin 11/07/2017>>>>>> 11/10/2017  IV azithromycin 11/07/2017>>>>>> 11/10/2017  Augmentin 11/10/2017   Subjective: Patient laying in bed.  Patient states feels worse than she did yesterday.  Patient still with cough.  Shortness of breath improving.  Patient complaining of left lower extremity pain and weakness.    Objective: Vitals:   11/09/17 0907 11/09/17 1447 11/09/17 2200 11/10/17 0535  BP:  (!) 102/54 118/76 128/66  Pulse:  100 (!) 101 94  Resp:  18 18 18   Temp:  98.2 F (36.8 C) 99.1 F (37.3 C) 99.5 F (37.5 C)  TempSrc:  Oral Axillary Oral  SpO2: (!) 89% 90% 92% 90%  Weight:      Height:        Intake/Output Summary (Last 24 hours) at 11/10/2017 1157 Last data filed at 11/09/2017 1909 Gross per 24 hour  Intake 350 ml  Output 1000 ml  Net -650 ml   Filed Weights   11/06/17 1854 11/09/17 0415  Weight: 59.8 kg (131 lb 13.4 oz) 63.3 kg (139 lb 8.8 oz)    Examination:  General exam: NAD  Respiratory system: Some coarse breath sounds.  No wheezing.  Respiratory effort normal. Cardiovascular system: Regular rate and rhythm no murmurs rubs or gallops.  No JVD.  Trace-1+ right lower extremity edema.  Gastrointestinal system: Abdomen is nontender, nondistended, soft, positive bowel sounds.  No hepatosplenomegaly.  Central nervous system: Alert and oriented. No focal neurological deficits. Extremities: 5 out of 5 bilateral upper extremity strength.  4/5 right lower extremity strength.  3 -4/5 left lower extremity strength.  Skin: No rashes, lesions or ulcers Psychiatry: Judgement and  insight appear normal. Mood & affect appropriate.     Data Reviewed: I have personally reviewed following labs and imaging studies  CBC: Recent Labs  Lab 11/06/17 1144 11/07/17 0901 11/08/17 0500 11/09/17 0400 11/10/17 0607  WBC 14.1* 15.4* 15.5* 15.1* 15.5*  NEUTROABS  --   --   --  12.8*  --   HGB 9.1* 10.4* 9.9* 10.3* 10.2*  HCT 29.3* 33.2* 31.5* 33.2* 31.9*  MCV 86.2 86.7 85.6 86.5 85.3  PLT 261 281 295 261 696   Basic Metabolic Panel: Recent Labs  Lab 11/06/17 1144 11/06/17 2000 11/07/17 0901 11/08/17 0500 11/09/17 0400 11/10/17 0607  NA 141  --  140 142 138 137  K 2.9*  --  3.5 4.0 4.6 3.9  CL 109  --  111 113* 107 105  CO2 24  --  22 24 26 26   GLUCOSE 88  --  88 97 99 93  BUN 9  --  7 8 7 8   CREATININE 0.70  --  0.67 0.62 0.70 0.69  CALCIUM 8.2*  --  8.1* 7.9* 8.4* 8.4*  MG 2.0 1.9  --   --  1.8  --   PHOS  --  2.3*  --  2.3* 3.1  --    GFR: Estimated Creatinine  Clearance: 53 mL/min (by C-G formula based on SCr of 0.69 mg/dL). Liver Function Tests: Recent Labs  Lab 11/06/17 1144 11/07/17 0901  AST 11* 10*  ALT 6* 6*  ALKPHOS 102 111  BILITOT 0.3 0.4  PROT 5.4* 5.4*  ALBUMIN 2.7* 2.6*   No results for input(s): LIPASE, AMYLASE in the last 168 hours. No results for input(s): AMMONIA in the last 168 hours. Coagulation Profile: Recent Labs  Lab 11/07/17 0901  INR 1.20   Cardiac Enzymes: No results for input(s): CKTOTAL, CKMB, CKMBINDEX, TROPONINI in the last 168 hours. BNP (last 3 results) No results for input(s): PROBNP in the last 8760 hours. HbA1C: No results for input(s): HGBA1C in the last 72 hours. CBG: Recent Labs  Lab 11/06/17 1147  GLUCAP 88   Lipid Profile: No results for input(s): CHOL, HDL, LDLCALC, TRIG, CHOLHDL, LDLDIRECT in the last 72 hours. Thyroid Function Tests: No results for input(s): TSH, T4TOTAL, FREET4, T3FREE, THYROIDAB in the last 72 hours. Anemia Panel: No results for input(s): VITAMINB12, FOLATE, FERRITIN,  TIBC, IRON, RETICCTPCT in the last 72 hours. Sepsis Labs: No results for input(s): PROCALCITON, LATICACIDVEN in the last 168 hours.  Recent Results (from the past 240 hour(s))  Culture, blood (Routine X 2) w Reflex to ID Panel     Status: None (Preliminary result)   Collection Time: 11/06/17 11:30 AM  Result Value Ref Range Status   Specimen Description BLOOD BLOOD RIGHT ARM  Final   Special Requests   Final    BOTTLES DRAWN AEROBIC AND ANAEROBIC Blood Culture adequate volume   Culture   Final    NO GROWTH 3 DAYS Performed at [facility], [address], [place], [place] [address]    Report Status PENDING  Incomplete  Culture, blood (Routine X 2) w Reflex to ID Panel     Status: None (Preliminary result)   Collection Time: 11/06/17 11:42 AM  Result Value Ref Range Status   Specimen Description BLOOD PORT  Final   Special Requests   Final    BOTTLES DRAWN AEROBIC AND ANAEROBIC Blood Culture adequate volume   Culture   Final    NO GROWTH 3 DAYS Performed at [facility] [address], [place], [place] [address]    Report Status PENDING  Incomplete  Respiratory Panel by PCR     Status: None   Collection Time: 11/06/17  6:13 PM  Result Value Ref Range Status   Adenovirus NOT DETECTED NOT DETECTED Final   Coronavirus 229E NOT DETECTED NOT DETECTED Final   Coronavirus HKU1 NOT DETECTED NOT DETECTED Final   Coronavirus NL63 NOT DETECTED NOT DETECTED Final   Coronavirus OC43 NOT DETECTED NOT DETECTED Final   Metapneumovirus NOT DETECTED NOT DETECTED Final   Rhinovirus / Enterovirus NOT DETECTED NOT DETECTED Final   Influenza A NOT DETECTED NOT DETECTED Final   Influenza B NOT DETECTED NOT DETECTED Final   Parainfluenza Virus 1 NOT DETECTED NOT DETECTED Final   Parainfluenza Virus 2 NOT DETECTED NOT DETECTED Final   Parainfluenza Virus 3 NOT DETECTED NOT DETECTED Final   Parainfluenza Virus 4 NOT DETECTED NOT DETECTED Final   Respiratory Syncytial Virus NOT DETECTED  NOT DETECTED Final   Bordetella pertussis NOT DETECTED NOT DETECTED Final   Chlamydophila pneumoniae NOT DETECTED NOT DETECTED Final   Mycoplasma pneumoniae NOT DETECTED NOT DETECTED Final    Comment: Performed at [facility], [place] [address], [place], [place] [address]  Culture, Urine  Status: Abnormal   Collection Time: 11/06/17  6:14 PM  Result Value Ref Range Status   Specimen Description URINE, CLEAN CATCH  Final   Special Requests NONE  Final   Culture MULTIPLE SPECIES PRESENT, SUGGEST RECOLLECTION (A)  Final   Report Status 11/08/2017 FINAL  Final  MRSA PCR Screening     Status: None   Collection Time: 11/07/17 12:34 AM  Result Value Ref Range Status   MRSA by PCR NEGATIVE NEGATIVE Final    Comment:        The GeneXpert MRSA Assay (FDA approved for NASAL specimens only), is one component of a comprehensive MRSA colonization surveillance program. It is not intended to diagnose MRSA infection nor to guide or monitor treatment for MRSA infections.   Culture, sputum-assessment     Status: None   Collection Time: 11/08/17  5:15 AM  Result Value Ref Range Status   Specimen Description SPUTUM  Final   Special Requests NONE  Final   Sputum evaluation THIS SPECIMEN IS ACCEPTABLE FOR SPUTUM CULTURE  Final   Report Status 11/08/2017 FINAL  Final  Culture, respiratory (NON-Expectorated)     Status: None (Preliminary result)   Collection Time: 11/08/17  5:15 AM  Result Value Ref Range Status   Specimen Description SPUTUM  Final   Special Requests NONE Reflexed from [id number]  Final   Gram Stain   Final    ABUNDANT WBC PRESENT, PREDOMINANTLY PMN NO SQUAMOUS EPITHELIAL CELLS SEEN RARE GRAM POSITIVE COCCI IN PAIRS Performed at [facility], [address], [place], [place] [address]    Culture FEW YEAST  Final   Report Status PENDING  Incomplete         Radiology Studies: No results found.      Scheduled Meds: . amLODipine  10 mg Oral Daily  .  amoxicillin-clavulanate  1 tablet Oral Q12H  . budesonide (PULMICORT) nebulizer solution  0.5 mg Nebulization BID  . clonazePAM  1 mg Oral QHS  . cyanocobalamin  1,000 mcg Intramuscular Q30 days  . dorzolamide-timolol  2 drop Both Eyes BID  . enoxaparin (LOVENOX) injection  90 mg Subcutaneous Q24H  . feeding supplement (ENSURE ENLIVE)  237 mL Oral BID BM  . HYDROcodone-homatropine  5 mL Oral TID  . ipratropium  0.5 mg Nebulization BID  . levalbuterol  1.25 mg Nebulization BID  . levothyroxine  88 mcg Oral QAC breakfast  . lisinopril  40 mg Oral Daily  . mirtazapine  15 mg Oral QHS  . sodium chloride flush  3 mL Intravenous Q12H  . vancomycin  125 mg Oral QID   Continuous Infusions:    LOS: 4 days    Time spent: 35 mins    Irine Seal, MD [facility] Pager [phone]  If 7PM-7AM, please contact night-coverage [email or web address] Password [facility] 11/10/2017, 11:57 AM

## 2017-11-11 ENCOUNTER — Inpatient Hospital Stay (HOSPITAL_COMMUNITY): Payer: Medicare Other

## 2017-11-11 DIAGNOSIS — Z515 Encounter for palliative care: Secondary | ICD-10-CM

## 2017-11-11 DIAGNOSIS — Z7189 Other specified counseling: Secondary | ICD-10-CM

## 2017-11-11 LAB — BASIC METABOLIC PANEL
ANION GAP: 6 (ref 5–15)
BUN: 8 mg/dL (ref 6–20)
CALCIUM: 8.6 mg/dL — AB (ref 8.9–10.3)
CO2: 26 mmol/L (ref 22–32)
CREATININE: 0.65 mg/dL (ref 0.44–1.00)
Chloride: 104 mmol/L (ref 101–111)
GFR calc Af Amer: >60 mL/min (ref >60)
GLUCOSE: 91 mg/dL (ref 65–99)
Potassium: 3.9 mmol/L (ref 3.5–5.1)
Sodium: 136 mmol/L (ref 135–145)

## 2017-11-11 LAB — CBC
HCT: 31.5 % — ABNORMAL LOW (ref 36.0–46.0)
Hemoglobin: 10.3 g/dL — ABNORMAL LOW (ref 12.0–15.0)
MCH: 28 pg (ref 26.0–34.0)
MCHC: 32.7 g/dL (ref 30.0–36.0)
MCV: 85.6 fL (ref 78.0–100.0)
PLATELETS: 203 10*3/uL (ref 150–400)
RBC: 3.68 MIL/uL — ABNORMAL LOW (ref 3.87–5.11)
RDW: 19.5 % — AB (ref 11.5–15.5)
WBC: 15.1 10*3/uL — AB (ref 4.0–10.5)

## 2017-11-11 LAB — CULTURE, BLOOD (ROUTINE X 2)
Culture: NO GROWTH
Culture: NO GROWTH
SPECIAL REQUESTS: ADEQUATE
Special Requests: ADEQUATE

## 2017-11-11 LAB — LEGIONELLA PNEUMOPHILA SEROGP 1 UR AG: L. pneumophila Serogp 1 Ur Ag: NEGATIVE

## 2017-11-11 MED ORDER — FLUCONAZOLE IN SODIUM CHLORIDE 200-0.9 MG/100ML-% IV SOLN
200.0000 mg | Freq: Once | INTRAVENOUS | Status: AC
  Administered 2017-11-11: 200 mg via INTRAVENOUS
  Filled 2017-11-11: qty 100

## 2017-11-11 MED ORDER — FLUCONAZOLE 100 MG PO TABS
100.0000 mg | ORAL_TABLET | Freq: Every day | ORAL | Status: DC
  Administered 2017-11-12: 100 mg via ORAL
  Filled 2017-11-11: qty 1

## 2017-11-11 MED ORDER — FUROSEMIDE 10 MG/ML IJ SOLN
20.0000 mg | Freq: Once | INTRAMUSCULAR | Status: AC
  Administered 2017-11-11: 20 mg via INTRAVENOUS
  Filled 2017-11-11: qty 2

## 2017-11-11 NOTE — Progress Notes (Addendum)
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 difficile colitis prior to admission.  Patient noted to have some electrolyte abnormalities.  Patient placed on IV fluids, metronidazole changed to oral vancomycin.  PT/OT.   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 difficile colitis: Per patient tested positive last week.   Anemia   Pressure injury of skin   Malnutrition of moderate degree  #1 generalized weakness Patient presented with generalized weakness which she stated had worsened over the past few weeks, however started in October 2018.  Patient with metastatic lung cancer including spinal metastases being followed by oncology.  Patient moving extremities spontaneously.  Patient denies any bowel or bladder incontinence.  Patient also noted to have diarrhea and stated was recently diagnosed approximately a week ago with a C. difficile colitis.  Patient also now with a community-acquired pneumonia versus bronchitis. Patient still with generalized weakness however some improvement since admission.  Patient with complaints of left lower extremity pain and weakness.  Patient with history of metastatic cancer with metastases to the spine.  MRI of the L-spine with some new or progressive osseous metastatic disease in the right iliac wing and liver since September 2018.  Regression of L3 metastases following radiation treatment.  Resolved malignant spinal stenosis.  Substantially  reduced but not fully resolved epidural and bilateral L2 neural foraminal tumor.  Satisfactory posttreatment appearance of smaller left L5 and right S1 metastases.  No new metastatic disease in the lumbar spine. . Repeat C. difficile PCR was canceled.  Urine cultures with multiple species present.   Influenza PCR negative.  Respiratory virus panel negative.  Blood cultures pending with no growth to date.  Continue oral vancomycin and treat for approximately 7-10 days.  Changed IV Rocephin and azithromycin to oral Augmentin.  PT/OT.  Supportive care.   2.  Hypokalemia/hypophosphatemia Likely secondary to GI losses from diarrhea per patient.    Magnesium level at 1.8.  Potassium level today at 3.9.  Repleted phosphate.   3.  Hypothyroidism TSH at 4.495.  Continue current regimen of Synthroid.  4.  Dehydration Improved.  Saline lock IV fluids.  5.  History of recently diagnosed DVT Lovenox per pharmacy.  6.  Acute bronchitis vs CAP Patient presented with a worsening cough of clear sputum.  Chest x-ray done on admission was negative for any acute infiltrate.  Repeat chest x-ray with stable patchy bilateral airspace disease.  Urine strep pneumococcus antigen and urine Legionella antigen negative.  Sputum Gram stain and cultures with few Candida tropicalis.  Patient with questionable oral thrush.  Patient also with rhonchorous breath sounds and continuous cough.  Place on fluconazole.  Continue scheduled Hycodan, scheduled nebulizers, Pulmicort, Claritin, Flonase, PPI.  Changed empiric antibiotics of IV Rocephin and azithromycin to oral Augmentin.  We will give a dose of IV Lasix.  Follow.  7.  Anemia Patient without any overt bleeding.  Anemia panel consistent with anemia of chronic disease/deficiency anemia. Hemoglobin currently stable at 10.3. Transfusion  threshold hemoglobin less than 7.  Follow.  8.  Probable C. difficile colitis Patient states was diagnosed a week prior to admission with  a C. difficile colitis.  Patient was on metronidazole prior to admission.  Metronidazole has been discontinued.  Consistency of stools improving daily.  Repeat C. difficile PCR was canceled.  Continue oral vancomycin and treat for total of 7-10 days.  Saline lock IV fluids.  Supportive care.    9.  Metastatic lung cancer Oncology has been informed of patient's admission via epic.  MRI of the L-spine with some new or progressive osseous metastatic disease in the right iliac wing and liver since September 2018.  Regression of L3 metastases following radiation treatment.  Resolved malignant spinal stenosis.  Substantially reduced but not fully resolved epidural and bilateral L2 neural foraminal tumor.  Satisfactory posttreatment appearance of smaller left L5 and right S1 metastases.  No new metastatic disease in the lumbar spine.  Patient has been assessed by PT OT who are recommending skilled nursing facility. Outpatient follow-up with hematology/oncology.  #10 pressure injury stage I sacral Frequent turns.      DVT prophylaxis: Lovenox Code Status: Full Family Communication: Updated patient and daughter at bedside.  Disposition Plan: Skilled nursing facility when medically stable hopefully in 1-2 days..    Consultants:   Palliative care pending.  Procedures:   Chest x-ray 11/07/2017, 11/06/2017  Abdominal films 11/06/2017   MRI L-spine 11/11/2017  Antimicrobials:   IV Rocephin 11/07/2017>>>>>> 11/10/2017  IV azithromycin 11/07/2017>>>>>> 11/10/2017  Augmentin 11/10/2017   Subjective: Patient sitting up in chair.  Patient still with a cough.  Patient denies any chest pain.  Patient complaining of left lower extremity weakness.  Patient still with some shortness of breath.   Objective: Vitals:   11/10/17 2135 11/11/17 0611 11/11/17 1000 11/11/17 1043  BP:  (!) 137/59    Pulse:  99    Resp:  20    Temp:  97.8 F (36.6 C)    TempSrc:  Oral    SpO2: 93% 94% 96% (!) 84%  Weight:   61.7 kg (136 lb 0.4 oz)    Height:        Intake/Output Summary (Last 24 hours) at 11/11/2017 1246 Last data filed at 11/11/2017 0900 Gross per 24 hour  Intake 480 ml  Output -  Net 480 ml   Filed Weights   11/06/17 1854 11/09/17 0415 11/11/17 0611  Weight: 59.8 kg (131 lb 13.4 oz) 63.3 kg (139 lb 8.8 oz) 61.7 kg (136 lb 0.4 oz)    Examination:  General exam: NAD  Respiratory system: Rhonchorous coarse breath sounds more pronounced today.  No wheezing.  Respiratory effort normal. Cardiovascular system: Regular rate and rhythm no murmurs rubs or gallops.  No JVD.  Trace-1+ right lower extremity edema.  Gastrointestinal system: Abdomen is soft, nontender, nondistended, positive bowel sounds.  No hepatosplenomegaly.  Central nervous system: Alert and oriented. No focal neurological deficits. Extremities: 5 out of 5 bilateral upper extremity strength.  4/5 right lower extremity strength.  3 /5 left lower extremity strength.  Skin: No rashes, lesions or ulcers Psychiatry: Judgement and insight appear normal. Mood & affect appropriate.     Data Reviewed: I have personally reviewed following labs and imaging studies  CBC: Recent Labs  Lab 11/07/17 0901 11/08/17 0500 11/09/17 0400 11/10/17 0607 11/11/17 0600  WBC 15.4* 15.5* 15.1* 15.5* 15.1*  NEUTROABS  --   --  12.8*  --   --   HGB  10.4* 9.9* 10.3* 10.2* 10.3*  HCT 33.2* 31.5* 33.2* 31.9* 31.5*  MCV 86.7 85.6 86.5 85.3 85.6  PLT 281 295 261 237 161   Basic Metabolic Panel: Recent Labs  Lab 11/06/17 1144 11/06/17 2000 11/07/17 0901 11/08/17 0500 11/09/17 0400 11/10/17 0607 11/11/17 0600  NA 141  --  140 142 138 137 136  K 2.9*  --  3.5 4.0 4.6 3.9 3.9  CL 109  --  111 113* 107 105 104  CO2 24  --  22 24 26 26 26   GLUCOSE 88  --  88 97 99 93 91  BUN 9  --  7 8 7 8 8   CREATININE 0.70  --  0.67 0.62 0.70 0.69 0.65  CALCIUM 8.2*  --  8.1* 7.9* 8.4* 8.4* 8.6*  MG 2.0 1.9  --   --  1.8  --   --   PHOS  --  2.3*  --   2.3* 3.1  --   --    GFR: Estimated Creatinine Clearance: 53 mL/min (by C-G formula based on SCr of 0.65 mg/dL). Liver Function Tests: Recent Labs  Lab 11/06/17 1144 11/07/17 0901  AST 11* 10*  ALT 6* 6*  ALKPHOS 102 111  BILITOT 0.3 0.4  PROT 5.4* 5.4*  ALBUMIN 2.7* 2.6*   No results for input(s): LIPASE, AMYLASE in the last 168 hours. No results for input(s): AMMONIA in the last 168 hours. Coagulation Profile: Recent Labs  Lab 11/07/17 0901  INR 1.20   Cardiac Enzymes: No results for input(s): CKTOTAL, CKMB, CKMBINDEX, TROPONINI in the last 168 hours. BNP (last 3 results) No results for input(s): PROBNP in the last 8760 hours. HbA1C: No results for input(s): HGBA1C in the last 72 hours. CBG: Recent Labs  Lab 11/06/17 1147  GLUCAP 88   Lipid Profile: No results for input(s): CHOL, HDL, LDLCALC, TRIG, CHOLHDL, LDLDIRECT in the last 72 hours. Thyroid Function Tests: No results for input(s): TSH, T4TOTAL, FREET4, T3FREE, THYROIDAB in the last 72 hours. Anemia Panel: No results for input(s): VITAMINB12, FOLATE, FERRITIN, TIBC, IRON, RETICCTPCT in the last 72 hours. Sepsis Labs: No results for input(s): PROCALCITON, LATICACIDVEN in the last 168 hours.  Recent Results (from the past 240 hour(s))  Culture, blood (Routine X 2) w Reflex to ID Panel     Status: None   Collection Time: 11/06/17 11:30 AM  Result Value Ref Range Status   Specimen Description BLOOD BLOOD RIGHT ARM  Final   Special Requests   Final    BOTTLES DRAWN AEROBIC AND ANAEROBIC Blood Culture adequate volume   Culture   Final    NO GROWTH 5 DAYS Performed at [facility], [address], [place], [place] [address]    Report Status 11/11/2017 FINAL  Final  Culture, blood (Routine X 2) w Reflex to ID Panel     Status: None   Collection Time: 11/06/17 11:42 AM  Result Value Ref Range Status   Specimen Description BLOOD PORT  Final   Special Requests   Final    BOTTLES DRAWN AEROBIC AND  ANAEROBIC Blood Culture adequate volume   Culture   Final    NO GROWTH 5 DAYS Performed at [facility], [place] [address] [place], [place] [address]    Report Status 11/11/2017 FINAL  Final  Respiratory Panel by PCR     Status: None   Collection Time: 11/06/17  6:13 PM  Result Value Ref Range Status   Adenovirus NOT  DETECTED NOT DETECTED Final   Coronavirus 229E NOT DETECTED NOT DETECTED Final   Coronavirus HKU1 NOT DETECTED NOT DETECTED Final   Coronavirus NL63 NOT DETECTED NOT DETECTED Final   Coronavirus OC43 NOT DETECTED NOT DETECTED Final   Metapneumovirus NOT DETECTED NOT DETECTED Final   Rhinovirus / Enterovirus NOT DETECTED NOT DETECTED Final   Influenza A NOT DETECTED NOT DETECTED Final   Influenza B NOT DETECTED NOT DETECTED Final   Parainfluenza Virus 1 NOT DETECTED NOT DETECTED Final   Parainfluenza Virus 2 NOT DETECTED NOT DETECTED Final   Parainfluenza Virus 3 NOT DETECTED NOT DETECTED Final   Parainfluenza Virus 4 NOT DETECTED NOT DETECTED Final   Respiratory Syncytial Virus NOT DETECTED NOT DETECTED Final   Bordetella pertussis NOT DETECTED NOT DETECTED Final   Chlamydophila pneumoniae NOT DETECTED NOT DETECTED Final   Mycoplasma pneumoniae NOT DETECTED NOT DETECTED Final    Comment: Performed at [facility], [place] [address], [place], [place] [address]  Culture, Urine     Status: Abnormal   Collection Time: 11/06/17  6:14 PM  Result Value Ref Range Status   Specimen Description URINE, CLEAN CATCH  Final   Special Requests NONE  Final   Culture MULTIPLE SPECIES PRESENT, SUGGEST RECOLLECTION (A)  Final   Report Status 11/08/2017 FINAL  Final  MRSA PCR Screening     Status: None   Collection Time: 11/07/17 12:34 AM  Result Value Ref Range Status   MRSA by PCR NEGATIVE NEGATIVE Final    Comment:        The GeneXpert MRSA Assay (FDA approved for NASAL specimens only), is one component of a comprehensive MRSA colonization surveillance program. It is  not intended to diagnose MRSA infection nor to guide or monitor treatment for MRSA infections.   Culture, sputum-assessment     Status: None   Collection Time: 11/08/17  5:15 AM  Result Value Ref Range Status   Specimen Description SPUTUM  Final   Special Requests NONE  Final   Sputum evaluation THIS SPECIMEN IS ACCEPTABLE FOR SPUTUM CULTURE  Final   Report Status 11/08/2017 FINAL  Final  Culture, respiratory (NON-Expectorated)     Status: None   Collection Time: 11/08/17  5:15 AM  Result Value Ref Range Status   Specimen Description SPUTUM  Final   Special Requests NONE Reflexed from [id number]  Final   Gram Stain   Final    ABUNDANT WBC PRESENT, PREDOMINANTLY PMN NO SQUAMOUS EPITHELIAL CELLS SEEN RARE GRAM POSITIVE COCCI IN PAIRS Performed at [facility], [address], [place],  [address]    Culture FEW CANDIDA TROPICALIS  Final   Report Status 11/10/2017 FINAL  Final         Radiology Studies: Mr Lumbar Spine Wo Contrast  Result Date: 11/11/2017 CLINICAL DATA:  77 year old female with metastatic lung cancer. Status post palliative radiotherapy in October 2018 to the lumbar spine from L2 to the sacrum. Increased weakness. EXAM: MRI LUMBAR SPINE WITHOUT CONTRAST TECHNIQUE: Multiplanar, multisequence MR imaging of the lumbar spine was performed. No intravenous contrast was administered. COMPARISON:  Pretreatment lumbar MRI 07/10/2017. lumbar radiographs 04/28/2017 FINDINGS: Segmentation: Normal on the comparison radiographs which is the same numbering system used on 07/10/2017. Alignment: Stable lumbar vertebral height and alignment since September. Vertebrae: Marrow signal remains diffusely abnormal in the L3 vertebral body, pedicles, and lamina. However, bulky epidural and paraspinal extraosseous tumor at that level has regressed since September. Associated spinal stenosis has resolved. There  is residual central and foraminal epidural tumor slightly greater on the  left (series 7, images 19 and 20). Bilateral L2 neural foraminal stenosis has regressed. Regressed and now subcentimeter left L5 transverse process metastasis (series 7, image 29). Mildly increased size but decreased signal throughout the right posterior S1 metastasis (16 mm now versus 8 mm) compatible with sclerotic change following treatment. Elsewhere in the lumbar spine from L2 through the sacrum marrow signal appears normal. Visible SI joints are intact. Marrow signal remains within normal limits at T12 and L1. However, there is nonspecific new decreased T1 marrow signal in the visible right iliac wing (series 7, image 41). Conus medullaris and cauda equina: Conus extends to the L1-L2 level. Conus and cauda equina appear normal. Paraspinal and other soft tissues: Moderate volume free fluid in the visible pelvis. Associated nonspecific presacral stranding. Intermittent fluid-filled but nondilated visible small bowel. Nonspecific bilateral perinephric stranding. Multiple mildly T2 hyperintense and T1 hypointense liver lesions are compatible with liver metastases and were not evident on the 07/10/2017 study. Patchy and confluent nonspecific left psoas muscle edema tracking from the L3 spinous process inferiorly. The psoas muscle is non enlarged. This most resembles interval muscle atrophy. However, there also is superimposed new bilateral lumbar erector spinae muscle T2 and FLAIR hyperintensity. Little to no muscle expansion. Disc levels: Resolved malignant spinal stenosis at the L3 vertebral level as stated above. Stable superimposed lumbar spine degeneration since 2018. IMPRESSION: 1. Possible new or progressed osseous metastatic disease in the right iliac wing and the liver since September 2018. 2. Regression of the L3 metastasis following radiation treatment. Resolved malignant spinal stenosis. Substantially reduced but not fully resolved epidural and bilateral L2 neural foraminal tumor. 3. Satisfactory post  treatment appearance of the smaller left L5 and right S1 metastases. No new metastatic disease in the lumbar spine. 4. Nonspecific new signal abnormality in the bilateral erector spinae muscles and left psoas muscle, perhaps due to interval muscle atrophy. 5. Nonspecific pelvic free fluid. Electronically Signed   By: Genevie Ann M.D.   On: 11/11/2017 09:06        Scheduled Meds: . amLODipine  10 mg Oral Daily  . amoxicillin-clavulanate  1 tablet Oral Q12H  . budesonide (PULMICORT) nebulizer solution  0.5 mg Nebulization BID  . clonazePAM  1 mg Oral QHS  . cyanocobalamin  1,000 mcg Intramuscular Q30 days  . dorzolamide-timolol  2 drop Both Eyes BID  . enoxaparin (LOVENOX) injection  90 mg Subcutaneous Q24H  . feeding supplement (ENSURE ENLIVE)  237 mL Oral BID BM  . [START ON 11/12/2017] fluconazole  100 mg Oral Daily  . furosemide  20 mg Intravenous Once  . HYDROcodone-homatropine  5 mL Oral TID  . ipratropium  0.5 mg Nebulization BID  . levalbuterol  1.25 mg Nebulization BID  . levothyroxine  88 mcg Oral QAC breakfast  . lisinopril  40 mg Oral Daily  . mirtazapine  15 mg Oral QHS  . sodium chloride flush  3 mL Intravenous Q12H  . vancomycin  125 mg Oral QID   Continuous Infusions: . fluconazole (DIFLUCAN) IV       LOS: 5 days    Time spent: 35 mins    Irine Seal, MD [facility] Pager [phone]  If 7PM-7AM, please contact night-coverage [email or web address] Password [facility] 11/11/2017, 12:46 PM

## 2017-11-11 NOTE — Progress Notes (Signed)
Met with pt at bedside and daughter via phone this morning to further discuss DC plans. Pt maintains her wish is to return directly to ALF [facility]) but expresses she understands SNF is being recommended.  Pt states she is willing to participate with therapy session today. Plan- following pt's next therapy session, will discuss her progress/status with [facility] liaison in order to determine if it is appropriate for her to return. If [facility] still in agreement SNF for rehab is best level of care, will proceed with pursuing SNF.   Daughter's preference is for [facility] currently. CSW left message with admissions. Noted that pt will need pre-authorization in order to admit to SNF and will need updated session with PT, Both daughter and pt express understanding.  Sharren Bridge, MSW, LCSW Clinical Social Work 11/11/2017 [phone]

## 2017-11-11 NOTE — Consult Note (Signed)
Consultation Note Date: 11/11/2017   Patient Name: [name]  DOB: [birth year]  MRN: [id number]  Age / Sex: 77 y.o., female  PCP: Harrison Mons, PA-C Referring Physician: Eugenie Filler, MD  Reason for Consultation: Establishing goals of care  HPI/Patient Profile: 77 y.o. 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 difficile.  Palliative consulted for goals of care  Clinical Assessment and Goals of Care: I met today with patient and her daughter Ezzard Flax. We discussed clinical course as well as wishes moving forward in regard to advanced directives.  Concepts specific to code status and rehospitalization discussed.  We discussed difference between a aggressive medical intervention path and a palliative, comfort focused care path.  Values and goals of care important to patient and family were attempted to be elicited.  We discussed that the hospital can be useful as long as [name] is getting well enough from care she receives at the hospital to enjoy time outside the hospital, but there is going to come a time in the near future where she may be better served with bringing care to her outside the hospital rather than repeated trips to the hospital. We discussed hospice as a tool that may be beneficial in this goal when he reaches a point where we are trying to fix problems that are not fixable.  Patient is in agreement that a good plan would be to transition to rehab as they have previously been arranging. She has done well with rehabbing in the past. If she does well at home and continues to thrive, I encouraged they continue with this plan and follow up with Dr. Julien Nordmann. If, however she is unable to regain function and he continues to decline, I  recommended that she speak with her PCP to determine if he may be better served by focusing his care on staying at home with support of organization such as hospice.  Questions and concerns addressed.   PMT will continue to support holistically.  SUMMARY OF RECOMMENDATIONS  - She would like to name her daughter, Ezzard Flax, as her HCPOA.  Consult placed to spiritual care. - Recommend palliative care to follow at SNF.  Please add this to discharge summary if you agree this is appropriate. - She will continue to consider long term goals, including her CODE STATUS.  Follow-up with Dr. Julien Nordmann as scheduled and recommend she discuss further with him.  She would be eligible to elect her hospice benefits if so desired at any point moving forward.  Code Status/Advance Care Planning: Full code    Primary Diagnoses: Present on Admission: . Bilateral lung cancer ([place]) . Hypokalemia . Hypertension . Dehydration . DVT (deep venous thrombosis) ([place]) . 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 Hypothyroidism . Deficiency anemia . Bronchitis, acute . C. difficile colitis: Per patient tested positive last week. . Anemia   I have reviewed the medical record, interviewed  the patient and family, and examined the patient. The following aspects are pertinent.  Past Medical History:  Diagnosis Date  . Bilateral lung cancer ([place]) 03/08/2011   recurrent  . Bone cancer ([place])    lung ca with spine mets  . Chronic fatigue 02/14/2016  . Dysuria 03/04/2017  . Encounter for antineoplastic immunotherapy 02/14/2016  . Foot pain 09/02/2011  . Glaucoma   . Hip pain 09/02/2011  . Hypercholesterolemia   . Hypertension   . Hypertension 10/29/2016  . Hypothyroidism   . Lung cancer ([place]) 03/08/2011   recurrent   Social History   Socioeconomic History  . Marital status: Widowed    Spouse name: None  . Number of children: 2  . Years of education: 25  . Highest education level: None  Social Needs  . Financial resource  strain: None  . Food insecurity - worry: None  . Food insecurity - inability: None  . Transportation needs - medical: None  . Transportation needs - non-medical: None  Occupational History  . Occupation: Retired  Tobacco Use  . Smoking status: Former Smoker    Packs/day: 1.00    Years: 50.00    Pack years: 50.00    Types: Cigarettes    Last attempt to quit: 09/28/2009    Years since quitting: 8.1  . Smokeless tobacco: Never Used  Substance and Sexual Activity  . Alcohol use: No  . Drug use: No  . Sexual activity: No  Other Topics Concern  . None  Social History Narrative   Lives at [facility]. Daughters live in [place] and [place], [place].  Retired.  Education: 2 years of college.    Family History  Problem Relation Age of Onset  . Stroke Father   . Heart disease Father 50       MI  . Cancer Brother        lung  . Cancer Cousin        unknown   Scheduled Meds: . amLODipine  10 mg Oral Daily  . amoxicillin-clavulanate  1 tablet Oral Q12H  . budesonide (PULMICORT) nebulizer solution  0.5 mg Nebulization BID  . clonazePAM  1 mg Oral QHS  . cyanocobalamin  1,000 mcg Intramuscular Q30 days  . dorzolamide-timolol  2 drop Both Eyes BID  . enoxaparin (LOVENOX) injection  90 mg Subcutaneous Q24H  . feeding supplement (ENSURE ENLIVE)  237 mL Oral BID BM  . [START ON 11/12/2017] fluconazole  100 mg Oral Daily  . HYDROcodone-homatropine  5 mL Oral TID  . ipratropium  0.5 mg Nebulization BID  . levalbuterol  1.25 mg Nebulization BID  . levothyroxine  88 mcg Oral QAC breakfast  . lisinopril  40 mg Oral Daily  . mirtazapine  15 mg Oral QHS  . sodium chloride flush  3 mL Intravenous Q12H  . vancomycin  125 mg Oral QID   Continuous Infusions: PRN Meds:.acetaminophen **OR** acetaminophen, alum & mag hydroxide-simeth, fluticasone, guaiFENesin-codeine, ipratropium, levalbuterol, lidocaine-prilocaine, ondansetron **OR** ondansetron (ZOFRAN) IV, traMADol Medications Prior to  Admission:  Prior to Admission medications   Medication Sig Start Date End Date Taking? Authorizing Provider  acetaminophen (TYLENOL) 500 MG tablet Take 500 mg by mouth every 4 (four) hours as needed for moderate pain, fever or headache.    Yes [provider]  alum & mag hydroxide-simeth ([place]) 200-200-20 MG/5ML suspension Take 30 mLs by mouth every 6 (six) hours as needed for indigestion or heartburn.   Yes [provider]  amLODipine (NORVASC) 5  MG tablet Take 2 tablets (10 mg total) by mouth daily. Per [name], NP 09/18/17  Yes Harrison Mons, PA-C  aspirin 81 MG tablet Take 1 tablet (81 mg total) by mouth daily. 09/18/17  Yes Jeffery, Chelle, PA-C  benzonatate (TESSALON) 100 MG capsule Take 1-2 capsules (100-200 mg total) by mouth 3 (three) times daily as needed for cough. Patient taking differently: Take 100 mg by mouth 3 (three) times daily as needed for cough.  10/15/17  Yes Jeffery, Chelle, PA-C  clonazePAM (KLONOPIN) 1 MG tablet Take 1 tablet (1 mg total) by mouth at bedtime. 09/18/17  Yes Jeffery, Chelle, PA-C  cyanocobalamin (,VITAMIN B-12,) 1000 MCG/ML injection Inject 1,000 mcg into the muscle every 30 (thirty) days.    Yes [provider]  dorzolamide-timolol (COSOPT) 22.3-6.8 MG/ML ophthalmic solution Place 2 drops into both eyes 2 (two) times daily. Per [name], NP 09/18/17  Yes Jacqulynn Cadet, Chelle, PA-C  enoxaparin (LOVENOX) 80 MG/0.8ML injection Inject 80 mg into the skin daily.    Yes [provider]  feeding supplement, ENSURE ENLIVE, (ENSURE ENLIVE) LIQD Take 237 mLs by mouth 2 (two) times daily between meals. 07/18/17  Yes Eugenie Filler, MD  fluticasone [facility]) 50 MCG/ACT nasal spray Place 1 spray into both nostrils daily as needed for allergies or rhinitis.   Yes [provider]  Guaifenesin (MUCINEX MAXIMUM STRENGTH) 1200 MG TB12 Take 1 tablet (1,200 mg total) by mouth every 12 (twelve) hours as needed. Patient taking  differently: Take 1 tablet by mouth every 12 (twelve) hours as needed.  10/15/17  Yes Jeffery, Chelle, PA-C  guaiFENesin-codeine (ROBAFEN AC) 100-10 MG/5ML syrup Take 10 mLs by mouth 4 (four) times daily as needed for cough.    Yes [provider]  levothyroxine (SYNTHROID, LEVOTHROID) 88 MCG tablet Take 1 tablet (88 mcg total) by mouth daily before breakfast. 09/18/17  Yes Jeffery, Chelle, PA-C  lidocaine-prilocaine (EMLA) cream Apply 1 application topically as needed (prior to port access). 03/05/16  Yes Curt Bears, MD  lisinopril (PRINIVIL,ZESTRIL) 40 MG tablet Take 1 tablet (40 mg total) by mouth daily. 09/18/17  Yes Jeffery, Chelle, PA-C  Loperamide HCl (LOPERAMIDE A-D PO) Take 2 mg by mouth every 3 (three) hours as needed (for diarrhea).    Yes [provider]  magnesium hydroxide (MILK OF MAGNESIA) 400 MG/5ML suspension Take 30 mLs by mouth daily as needed for mild constipation.   Yes [provider]  metroNIDAZOLE (FLAGYL) 500 MG tablet Take 500 mg by mouth 3 (three) times daily. 10/29/17  Yes [provider]  traMADol (ULTRAM) 50 MG tablet Take 1 tablet (50 mg total) by mouth every 6 (six) hours as needed for severe pain. 09/18/17  Yes Jeffery, Chelle, PA-C  meloxicam (MOBIC) 7.5 MG tablet TAKE 1 TABLET BY MOUTH EVERY DAY Patient not taking: Reported on 11/06/2017 06/09/17   Harrison Mons, PA-C  mirtazapine (REMERON) 7.5 MG tablet Take 1 tablet (7.5 mg total) by mouth at bedtime. Patient not taking: Reported on 11/06/2017 07/04/17   Harrison Mons, PA-C   Allergies  Allergen Reactions  . Cymbalta [Duloxetine Hcl] Other (See Comments)    Involuntary movements " turned me completely around"  . Fish Allergy Diarrhea  . Amitriptyline     Initial reaction about 25 years ago.  . Codeine Nausea And Vomiting    vomiting  . Mirabegron Hypertension  . Solifenacin Other (See Comments)    Due to glaucoma  . Sulfa Antibiotics Nausea And Vomiting   Review  of  Systems  Constitutional: Positive for activity change, appetite change and fatigue.   Physical Exam  General: Alert, awake, in no acute distress. Chronically ill appearing HEENT: No bruits, no goiter, no JVD Heart: Regular rate and rhythm. No murmur appreciated. Lungs: Good air movement, clear Abdomen: Soft, nontender, nondistended, positive bowel sounds.  Ext: No significant edema Skin: Warm and dry Neuro: Grossly intact, nonfocal.   Vital Signs: BP (!) 111/55 (BP Location: Left Arm) Comment: informed RN  Pulse 100   Temp 98.3 F (36.8 C) (Oral)   Resp 18   Ht '5\' 5"'  (1.651 m)   Wt 61.7 kg (136 lb 0.4 oz)   SpO2 100%   BMI 22.64 kg/m  Pain Assessment: No/denies pain POSS *See Group Information*: 1-Acceptable,Awake and alert Pain Score: 0-No pain   SpO2: SpO2: 100 % O2 Device:SpO2: 100 % O2 Flow Rate: .O2 Flow Rate (L/min): 2 L/min  IO: Intake/output summary:   Intake/Output Summary (Last 24 hours) at 11/11/2017 2154 Last data filed at 11/11/2017 1903 Gross per 24 hour  Intake 480 ml  Output 1000 ml  Net -520 ml    LBM: Last BM Date: 11/11/17 Baseline Weight: Weight: 59.8 kg (131 lb 13.4 oz) Most recent weight: Weight: 61.7 kg (136 lb 0.4 oz)     Palliative Assessment/Data:   Flowsheet Rows     Most Recent Value  Intake Tab  Referral Department  Hospitalist  Unit at Time of Referral  Oncology Unit  Palliative Care Primary Diagnosis  Cancer  Date Notified  11/10/17  Palliative Care Type  New Palliative care  Reason for referral  Clarify Goals of Care  Date of Admission  11/06/17  Date first seen by Palliative Care  11/11/17  # of days Palliative referral response time  1 Day(s)  # of days IP prior to Palliative referral  4  Clinical Assessment  Palliative Performance Scale Score  50%  Psychosocial & Spiritual Assessment  Palliative Care Outcomes  Patient/Family meeting held?  Yes  Who was at the meeting?  Patient, daughter  Palliative Care Outcomes   Clarified goals of care      Time In: 1320 Time Out: 1440 Time Total: 80 Greater than 50%  of this time was spent counseling and coordinating care related to the above assessment and plan.  Signed by: Micheline Rough, MD   Please contact Palliative Medicine Team phone at [phone] for questions and concerns.  For individual provider: See Shea Evans

## 2017-11-11 NOTE — Progress Notes (Signed)
Physical Therapy Treatment Patient Details Name: [name] MRN: [id number] DOB: [birth year] Today's Date: 11/11/2017    History of Present Illness 77 yo female with onset of weakness and has recurrent lung CA with bronchitis, mets. PMHx:  PE, DJD lumbar spine, HTN, hip pain, ALF resident     PT Comments    Patient with decreased mobility this session requiring increased assist and unable to maintain standing.  Incontinent of stool during session and required assist for hygiene.  Continue to feel she is appropriate for SNF level rehab as likely much higher care needs than when at ALF.  PT to follow acutely.   Follow Up Recommendations  SNF     Equipment Recommendations  Rolling walker with 5" wheels    Recommendations for Other Services       Precautions / Restrictions Precautions Precautions: Fall Precaution Comments: enteric precautions Restrictions Weight Bearing Restrictions: No    Mobility  Bed Mobility Overal bed mobility: Needs Assistance Bed Mobility: Supine to Sit     Supine to sit: HOB elevated;Mod assist     General bed mobility comments: pt able to initiate moving legs towards EOB and reaching for rail, assist to complete getting feet off bed and lifting trunk upright and scooting to EOB   Transfers Overall transfer level: Needs assistance Equipment used: Rolling walker (2 wheeled) Transfers: Sit to/from [employer] Sit to Stand: Max assist   Squat pivot transfers: Max assist     General transfer comment: initially stood with RW and pt unable to extend trunk and get COG over BOS plus noted bowel incontinence so returned to seated on EOB.  Obtained BSC and attempted again to stand and step to [facility] with RW, but pt too weak and unable to take steps, so broght BSC close and assisted pt to pivot to [facility] lifting and lowering assistance, brought recliner close and assisted again to pivot to recliner.   Ambulation/Gait                  Stairs            Wheelchair Mobility    Modified Rankin (Stroke Patients Only)       Balance Overall balance assessment: History of Falls;Needs assistance Sitting-balance support: Feet supported;Bilateral upper extremity supported Sitting balance-Leahy Scale: Poor Sitting balance - Comments: tendency for posterior lean, but improved some over time with cues and getting feet positioned appropriately; leaned forward on [facility] for hygiene and to apply barrier cream   Standing balance support: Bilateral upper extremity supported Standing balance-Leahy Scale: Zero Standing balance comment: unable to maintain standing with mod to max A with RW                            Cognition Arousal/Alertness: Awake/alert Behavior During Therapy: WFL for tasks assessed/performed Overall Cognitive Status: History of cognitive impairments - at baseline                                        Exercises General Exercises - Lower Extremity Ankle Circles/Pumps: AROM;10 reps;Both;Supine Heel Slides: AAROM;AROM;Both;Supine;5 reps    General Comments        Pertinent Vitals/Pain Faces Pain Scale: Hurts little more Pain Location: bottom and L LE Pain Descriptors / Indicators: Sore Pain Intervention(s): Monitored during session;Repositioned;Other (comment)(padded chair)    Home Living  Prior Function            PT Goals (current goals can now be found in the care plan section) Progress towards PT goals: Not progressing toward goals - comment    Frequency    Min 2X/week      PT Plan Current plan remains appropriate    Co-evaluation              AM-PAC PT "6 Clicks" Daily Activity  Outcome Measure  Difficulty turning over in bed (including adjusting bedclothes, sheets and blankets)?: Unable Difficulty moving from lying on back to sitting on the side of the bed? : Unable Difficulty sitting down on and standing  up from a chair with arms (e.g., wheelchair, bedside commode, etc,.)?: Unable Help needed moving to and from a bed to chair (including a wheelchair)?: Total Help needed walking in hospital room?: Total Help needed climbing 3-5 steps with a railing? : Total 6 Click Score: 6    End of Session Equipment Utilized During Treatment: Gait belt Activity Tolerance: Patient limited by fatigue Patient left: with call bell/phone within reach;with chair alarm set;in chair Nurse Communication: Mobility status PT Visit Diagnosis: Unsteadiness on feet (R26.81);Other abnormalities of gait and mobility (R26.89);Muscle weakness (generalized) (M62.81)     Time: 9935-7017 PT Time Calculation (min) (ACUTE ONLY): 40 min  Charges:  $Therapeutic Exercise: 8-22 mins $Therapeutic Activity: 23-37 mins                    G CodesMagda [name], [place] [phone] 11/11/2017    [name] 11/11/2017, 1:25 PM

## 2017-11-11 NOTE — Progress Notes (Signed)
Discussed DC plans again with pt/daughter/[facility] ALF liaison. All in agreement at this point that pt would best benefit from SNF rehab at DC. Selected [facility] and facility obtained approved insurance authorization.  Also requesting palliative care to follow at facility once discharged.   Sharren Bridge, MSW, LCSW Clinical Social Work 11/11/2017 [phone]

## 2017-11-12 MED ORDER — HYDROCODONE-HOMATROPINE 5-1.5 MG/5ML PO SYRP: 5.0000 mL | ORAL_SOLUTION | Freq: Three times a day (TID) | ORAL | 0 refills | Status: AC

## 2017-11-12 MED ORDER — ONDANSETRON HCL 4 MG PO TABS: 4.0000 mg | ORAL_TABLET | Freq: Four times a day (QID) | ORAL | 0 refills | Status: AC | PRN

## 2017-11-12 MED ORDER — MIRTAZAPINE 15 MG PO TBDP: 15.0000 mg | ORAL_TABLET | Freq: Every day | ORAL | 0 refills | Status: AC

## 2017-11-12 MED ORDER — LEVALBUTEROL HCL 0.63 MG/3ML IN NEBU: 0.6300 mg | INHALATION_SOLUTION | RESPIRATORY_TRACT | 12 refills | Status: AC | PRN

## 2017-11-12 MED ORDER — HEPARIN SOD (PORK) LOCK FLUSH 100 UNIT/ML IV SOLN
500.0000 [IU] | Freq: Once | INTRAVENOUS | Status: DC
  Filled 2017-11-12: qty 5

## 2017-11-12 MED ORDER — TRAMADOL HCL 50 MG PO TABS: 50.0000 mg | ORAL_TABLET | Freq: Four times a day (QID) | ORAL | 0 refills | Status: AC | PRN

## 2017-11-12 MED ORDER — CLONAZEPAM 1 MG PO TABS: 1.0000 mg | ORAL_TABLET | Freq: Every day | ORAL | 0 refills | Status: AC

## 2017-11-12 MED ORDER — AMOXICILLIN-POT CLAVULANATE 875-125 MG PO TABS: 1.0000 | ORAL_TABLET | Freq: Two times a day (BID) | ORAL | 0 refills | Status: AC

## 2017-11-12 MED ORDER — IPRATROPIUM BROMIDE 0.02 % IN SOLN: 0.5000 mg | Freq: Two times a day (BID) | RESPIRATORY_TRACT | 12 refills | Status: AC

## 2017-11-12 MED ORDER — LEVALBUTEROL HCL 1.25 MG/0.5ML IN NEBU: 1.2500 mg | INHALATION_SOLUTION | Freq: Two times a day (BID) | RESPIRATORY_TRACT | 12 refills | Status: AC

## 2017-11-12 MED ORDER — IPRATROPIUM BROMIDE 0.02 % IN SOLN: 0.5000 mg | RESPIRATORY_TRACT | 12 refills | Status: AC | PRN

## 2017-11-12 MED ORDER — VANCOMYCIN 50 MG/ML ORAL SOLUTION: 125.0000 mg | Freq: Four times a day (QID) | ORAL | 0 refills | Status: AC

## 2017-11-12 MED ORDER — FLUCONAZOLE 100 MG PO TABS: 100.0000 mg | ORAL_TABLET | Freq: Every day | ORAL | 0 refills | Status: AC

## 2017-11-12 NOTE — Discharge Instructions (Signed)
Vancomycin oral solution What is this medicine? VANCOMYCIN Lucianne Lei koe MYE sin) is a glycopeptide antibiotic. It is used to treat certain kinds of bacterial infections in the bowel. It will not work for colds, flu, or other viral infections. This medicine may be used for other purposes; ask your health care provider or pharmacist if you have questions. COMMON BRAND NAME(S): Vancocin What should I tell my health care provider before I take this medicine? They need to know if you have any of these conditions: -dehydration -inflammatory bowel disease -kidney disease -other chronic illness -an unusual or allergic reaction to vancomycin, other medicines, foods, dyes, or preservatives -pregnant or trying to get pregnant -breast-feeding How should I use this medicine? Take this medicine by mouth. Follow the directions on the prescription label. Shake well before using. Use a specially marked spoon or dropper to measure each dose. Ask your pharmacist if you do not have one. Household spoons are not accurate. Take your medicine at regular intervals. Do not take your medicine more often than directed. Take all of your medicine as directed even if you think you are better. Do not skip doses or stop your medicine early. Talk to your pediatrician regarding the use of this medicine in children. Special care may be needed. Overdosage: If you think you have taken too much of this medicine contact a poison control center or emergency room at once. NOTE: This medicine is only for you. Do not share this medicine with others. What if I miss a dose? If you miss a dose, take it as soon as you can. If it is almost time for your next dose, take only that dose. Do not take double or extra doses. What may interact with this medicine? -birth control pills -cholestyramine -colestipol -vancomycin injection This list may not describe all possible interactions. Give your health care provider a list of all the medicines,  herbs, non-prescription drugs, or dietary supplements you use. Also tell them if you smoke, drink alcohol, or use illegal drugs. Some items may interact with your medicine. What should I watch for while using this medicine? Tell your doctor or health care professional if your symptoms do not improve or if you get new symptoms. Avoid taking this medicine within 3 or 4 hours of taking cholestyramine or colestipol. What side effects may I notice from receiving this medicine? Side effects that you should report to your doctor or health care professional as soon as possible: -allergic reactions like skin rash, itching or hives, swelling of the face, lips, or tongue -breathing difficulty -change in amount, color of urine -change in hearing -dizziness -fever, infection -redness, blistering, peeling or loosening of the skin, including inside the mouth -unusual bleeding or bruising -unusually weak or tired Side effects that usually do not require medical attention (report to your doctor or health care professional if they continue or are bothersome): -nausea, vomiting -stomach cramps This list may not describe all possible side effects. Call your doctor for medical advice about side effects. You may report side effects to FDA at 1-800-FDA-1088. Where should I keep my medicine? Keep out of the reach of children. After this medicine is mixed by your pharmacist, store it in a refrigerator between 2 and 8 degrees C (36 and 46 degrees F). Do not freeze. Throw away any unused medicine after 14 days. NOTE: This sheet is a summary. It may not cover all possible information. If you have questions about this medicine, talk to your doctor, pharmacist, or health care  provider.  2018 Elsevier/Gold Standard (2013-05-07 14:46:53)

## 2017-11-12 NOTE — Progress Notes (Signed)
Nsg Discharge Note  Admit Date:  11/06/2017 Discharge date: 11/12/2017   [name] to be D/C'd Rehab facility per MD order.  AVS completed and placed in packet. Patient/caregiver able to verbalize understanding.  Skin: breakdown noted to sacrum. Foam dressing in place. Port deaccessed by [employer]. Site without signs and symptoms of complications - no redness or edema noted at insertion site, patient denies c/o pain. Report called to April LPN at the [facility] rehab facility. No questions or concerns voiced when asked. Pt left unit via PTAR.   Dorita Fray, RN 11/12/2017 12:01 PM

## 2017-11-12 NOTE — Clinical Social Work Placement (Signed)
Pt discharged with plan to admit to [facility] for ST rehab- room #113- report#[phone]  (Pt's [place] aware pt is going to rehab with plan to return to ALF following. Also aware pt wish for palliative care to follow.) Spoke with daughter via phone- aware and agreeable to plan Arranged PTAR transport See below for placement details   CLINICAL SOCIAL WORK PLACEMENT  NOTE  Date:  11/12/2017  Patient Details  Name: [name] MRN: [id number] Date of Birth: March 21, 1941  Clinical Social Work is seeking post-discharge placement for this patient at the [place] level of care (*CSW will initial, date and re-position this form in  chart as items are completed):  Yes   Patient/family provided with [facility] Work Department's list of facilities offering this level of care within the geographic area requested by the patient (or if unable, by the patient's family).  Yes   Patient/family informed of their freedom to choose among providers that offer the needed level of care, that participate in Medicare, Medicaid or managed care program needed by the patient, have an available bed and are willing to accept the patient.  Yes   Patient/family informed of [facility]'s ownership interest in [facility] and [facility], as well as of the fact that they are under no obligation to receive care at these facilities.  PASRR submitted to EDS on       PASRR number received on       Existing PASRR number confirmed on 11/09/17     FL2 transmitted to all facilities in geographic area requested by pt/family on 11/09/17     FL2 transmitted to all facilities within larger geographic area on       Patient informed that his/her managed care company has contracts with or will negotiate with certain facilities, including the following:        Yes   Patient/family informed of bed offers received.  Patient chooses bed at [facility]     Physician recommends and patient chooses bed at [facility]    Patient to be transferred to [facility] on 11/12/17.  Patient to be transferred to facility by PTAR     Patient family notified on 11/12/17 of transfer.  Name of family member notified:  [facility]     PHYSICIAN       Additional Comment:    _______________________________________________ Nila Nephew, LCSW 11/12/2017, 10:52 AM

## 2017-11-12 NOTE — Progress Notes (Signed)
Care progress note  Reason for consult: Goals of care in light of metastatic disease and continued functional decline  I met with [name] again this morning to review our prior conversation regarding long-term goals of care in light of her metastatic disease and continued functional decline.  She reports wanting to transition to skilled facility to get stronger but also being a little apprehensive at change in care venue.  We talked again about the goal of medical interventions to be to allow her quality time outside of the hospital and the goal of therapy is to allow her to regain as much functional status as possible.  Briefly discussed again likely pathways moving forward of continued improvement and follow-up with Dr. Earlie Server versus continued functional decline and consideration for electing her hospice benefits.  She reports being appreciative of time spent discussing this with her.  Palliative care to follow-up at skilled facility.  Total time: 20 minutes Greater than 50%  of this time was spent counseling and coordinating care related to the above assessment and plan.  Micheline Rough, MD [facility] [phone]

## 2017-11-12 NOTE — Discharge Summary (Addendum)
Physician Discharge Summary  [name] ENI:[id number] DOB: [birth year] DOA: 11/06/2017  PCP: Harrison Mons, PA-C  Admit date: 11/06/2017 Discharge date: 11/12/2017  Recommendations for Outpatient Follow-up:  Continue vanco PO through 11/16/2017 Continue Augmentin for 5 days on discharge  Continue fluconazole for 4 day on discharge   Continue palliative care services in SNF to address goals of care   Discharge Diagnoses:  Principal Problem:   Weakness Active Problems:   Bilateral lung cancer ([place])   Deficiency anemia   Hypokalemia   Hypertension   Metastasis to spinal column (H[facility])   Dehydration   Hypothyroidism   DVT (deep venous thrombosis) (H[facility])   Bronchitis, acute   C. difficile colitis: Per patient tested positive last week.   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 difficile colitis prior to admission.  Patient noted to have some electrolyte abnormalities.  Patient placed on IV fluids, metronidazole changed to oral vancomycin.  PT/OT."   Hospital Course:  #1 generalized weakness Likely due to combination of metastatic lung cancer and dehydration Patient presented with generalized weakness which she stated had worsened over the past few weeks, however started in October 2018. Patient with metastatic lung cancer including spinal metastases being followed by oncology. Patient moving extremities spontaneously. Patient denies any bowel or bladder incontinence. Patient also noted to have diarrhea and stated was recently diagnosed approximately a week ago with a C. difficile colitis.  Patient also now with a community-acquired  pneumonia versus bronchitis. Patient still with generalized weakness however some improvement since admission.  Patient with complaints of left lower extremity pain and weakness.  Patient with history of metastatic cancer with metastases to the spine.  MRI of the L-spine with some new or progressive osseous metastatic disease in the right iliac wing and liver since September 2018.  Regression of L3 metastases following radiation treatment.  Resolved malignant spinal stenosis.  Substantially reduced but not fully resolved epidural and bilateral L2 neural foraminal tumor.  Satisfactory posttreatment appearance of smaller left L5 and right S1 metastases.  No new metastatic disease in the lumbar spine. . Repeat C. difficile PCR was canceled.  Urine cultures with multiple species present.   Influenza PCR negative.  Respiratory virus panel negative.  Blood cultures with no growth to date.   Oral vanco through 11/16/17 and Augmentin for 5 days on discharge   2. Hypokalemia/hypophosphatemia Likely secondary to GI losses from diarrhea per patient.   Magnesium level at 1.8.  Potassium level today at 3.9.  Repleted phosphate.   3. Hypothyroidism - Continue synthroid   4. Dehydration Improved.  Saline lock IV fluids.  5. History of recently diagnosed DVT Lovenox per pharmacy.  6. Acute bronchitis vs CAP Patient presented with a worsening cough of clear sputum. Chest x-ray done on admission was negative for any acute infiltrate.  Repeat chest x-ray with stable patchy bilateral airspace disease.  Urine strep pneumococcus antigen and urine Legionella antigen negative.  Sputum Gram stain and cultures with few Candida tropicalis.  Patient with questionable oral thrush.  Patient also with rhonchorous breath sounds and continuous cough.  Place on fluconazole.  Continue scheduled Hycodan, scheduled nebulizers, Pulmicort, Claritin, Flonase, PPI.  Changed empiric  antibiotics of IV Rocephin and azithromycin to  oral Augmentin.  We will give a dose of IV Lasix.   Augmentin for 5 days on discharge   7. Anemia Patient without any overt bleeding.  Anemia panel consistent with anemia of chronic disease/deficiency anemia.Hemoglobin currently stable at 10.3.Transfusion threshold hemoglobin less than 7.  - Hgb stable this am, 10.3  8. Probable C. difficile colitis - PO vanco through 11/16/2017 on discharge   9. Metastatic lung cancer Oncology has been informed of patient's admission via epic.  MRI of the L-spine with some new or progressive osseous metastatic disease in the right iliac wing and liver since September 2018.  Regression of L3 metastases following radiation treatment.  Resolved malignant spinal stenosis.  Substantially reduced but not fully resolved epidural and bilateral L2 neural foraminal tumor.  Satisfactory posttreatment appearance of smaller left L5 and right S1 metastases.  No new metastatic disease in the lumbar spine.  Patient has been assessed by PT OT who are recommending skilled nursing facility. Outpatient follow-up with hematology/oncology.  #10 pressure injury stage I sacral Frequent turns.    DVT prophylaxis: Lovenox subQ Code Status: Full Family Communication: No family at the bedside this am    Consultants:   Palliative care pending.  Procedures:   Chest x-ray 11/07/2017, 11/06/2017  Abdominal films 11/06/2017   MRI L-spine 11/11/2017  Antimicrobials:   IV Rocephin 11/07/2017>>>>>> 11/10/2017  IV azithromycin 11/07/2017>>>>>> 11/10/2017  Augmentin 11/10/2017    Signed:  Leisa Lenz, MD  [facility] 11/12/2017, 10:21 AM  Pager #: [phone]  Time spent in minutes: more than 30 minutes    Discharge Exam: Vitals:   11/12/17 0815 11/12/17 0945  BP:  120/60  Pulse:    Resp:    Temp:    SpO2: 90%    Vitals:   11/11/17 2223 11/12/17 0440 11/12/17 0815 11/12/17 0945  BP: 109/73 121/60  120/60  Pulse: (!) 111 88    Resp: 20 20     Temp: 98.6 F (37 C) 98.4 F (36.9 C)    TempSrc: Oral Oral    SpO2: 93% 91% 90%   Weight:  60 kg (132 lb 4.4 oz)    Height:        General: Pt is alert, follows commands appropriately, not in acute distress Cardiovascular: Regular rate and rhythm, S1/S2 + Respiratory: Clear to auscultation bilaterally, no wheezing, no crackles, no rhonchi Abdominal: Soft, non tender, non distended, bowel sounds +, no guarding Extremities: no edema, no cyanosis, pulses palpable bilaterally DP and PT Neuro: Grossly nonfocal  Discharge Instructions  Discharge Instructions    Call MD for:  persistant nausea and vomiting   Complete by:  As directed    Call MD for:  redness, tenderness, or signs of infection (pain, swelling, redness, odor or green/yellow discharge around incision site)   Complete by:  As directed    Call MD for:  severe uncontrolled pain   Complete by:  As directed    Diet - low sodium heart healthy   Complete by:  As directed    Discharge instructions   Complete by:  As directed    Continue vanco PO through 11/16/2017   Increase activity slowly   Complete by:  As directed      Allergies as of 11/12/2017      Reactions   Cymbalta [duloxetine Hcl] Other (See Comments)   Involuntary movements " turned me completely around"   Fish Allergy Diarrhea   Amitriptyline  Initial reaction about 25 years ago.   Codeine Nausea And Vomiting   vomiting   Mirabegron Hypertension   Solifenacin Other (See Comments)   Due to glaucoma   Sulfa Antibiotics Nausea And Vomiting      Medication List    STOP taking these medications   aspirin 81 MG tablet   meloxicam 7.5 MG tablet Commonly known as:  MOBIC   metroNIDAZOLE 500 MG tablet Commonly known as:  FLAGYL   mirtazapine 7.5 MG tablet Commonly known as:  REMERON Replaced by:  mirtazapine 15 MG disintegrating tablet     TAKE these medications   acetaminophen 500 MG tablet Commonly known as:  TYLENOL Take 500 mg by mouth  every 4 (four) hours as needed for moderate pain, fever or headache.   amLODipine 5 MG tablet Commonly known as:  NORVASC Take 2 tablets (10 mg total) by mouth daily. Per [name], NP   amoxicillin-clavulanate [phone] MG tablet Commonly known as:  AUGMENTIN Take 1 tablet by mouth every 12 (twelve) hours.   benzonatate 100 MG capsule Commonly known as:  TESSALON Take 1-2 capsules (100-200 mg total) by mouth 3 (three) times daily as needed for cough. What changed:  how much to take   clonazePAM 1 MG tablet Commonly known as:  KLONOPIN Take 1 tablet (1 mg total) by mouth at bedtime.   cyanocobalamin 1000 MCG/ML injection Commonly known as:  (VITAMIN B-12) Inject 1,000 mcg into the muscle every 30 (thirty) days.   dorzolamide-timolol 22.3-6.8 MG/ML ophthalmic solution Commonly known as:  COSOPT Place 2 drops into both eyes 2 (two) times daily. Per [name], NP   enoxaparin 80 MG/0.8ML injection Commonly known as:  LOVENOX Inject 80 mg into the skin daily.   feeding supplement (ENSURE ENLIVE) Liqd Take 237 mLs by mouth 2 (two) times daily between meals.   fluconazole 100 MG tablet Commonly known as:  DIFLUCAN Take 1 tablet (100 mg total) by mouth daily for 4 days. Start taking on:  11/13/2017   fluticasone 50 MCG/ACT nasal spray Commonly known as:  FLONASE Place 1 spray into both nostrils daily as needed for allergies or rhinitis.   Guaifenesin 1200 MG Tb12 Commonly known as:  MUCINEX MAXIMUM STRENGTH Take 1 tablet (1,200 mg total) by mouth every 12 (twelve) hours as needed.   HYDROcodone-homatropine 5-1.5 MG/5ML syrup Commonly known as:  HYCODAN Take 5 mLs by mouth 3 (three) times daily.   ipratropium 0.02 % nebulizer solution Commonly known as:  ATROVENT Take 2.5 mLs (0.5 mg total) by nebulization every 2 (two) hours as needed for wheezing or shortness of breath (use if refractory today xopenex).   ipratropium 0.02 % nebulizer solution Commonly known as:   ATROVENT Take 2.5 mLs (0.5 mg total) by nebulization 2 (two) times daily.   levalbuterol 0.63 MG/3ML nebulizer solution Commonly known as:  XOPENEX Take 3 mLs (0.63 mg total) by nebulization every 2 (two) hours as needed for wheezing or shortness of breath.   levalbuterol 1.25 MG/0.5ML nebulizer solution Commonly known as:  XOPENEX Take 1.25 mg by nebulization 2 (two) times daily.   levothyroxine 88 MCG tablet Commonly known as:  SYNTHROID, LEVOTHROID Take 1 tablet (88 mcg total) by mouth daily before breakfast.   lidocaine-prilocaine cream Commonly known as:  EMLA Apply 1 application topically as needed (prior to port access).   lisinopril 40 MG tablet Commonly known as:  PRINIVIL,ZESTRIL Take 1 tablet (40 mg total) by mouth daily.   LOPERAMIDE A-D PO Take  2 mg by mouth every 3 (three) hours as needed (for diarrhea).   magnesium hydroxide 400 MG/5ML suspension Commonly known as:  MILK OF MAGNESIA Take 30 mLs by mouth daily as needed for mild constipation.   [place] 200-200-20 MG/5ML suspension Generic drug:  alum & mag hydroxide-simeth Take 30 mLs by mouth every 6 (six) hours as needed for indigestion or heartburn.   mirtazapine 15 MG disintegrating tablet Commonly known as:  REMERON SOL-TAB Take 1 tablet (15 mg total) by mouth at bedtime. Replaces:  mirtazapine 7.5 MG tablet   ondansetron 4 MG tablet Commonly known as:  ZOFRAN Take 1 tablet (4 mg total) by mouth every 6 (six) hours as needed for nausea.   ROBAFEN AC 100-10 MG/5ML syrup Generic drug:  guaiFENesin-codeine Take 10 mLs by mouth 4 (four) times daily as needed for cough.   traMADol 50 MG tablet Commonly known as:  ULTRAM Take 1 tablet (50 mg total) by mouth every 6 (six) hours as needed for severe pain.   vancomycin 50 mg/mL oral solution Commonly known as:  VANCOCIN Take 2.5 mLs (125 mg total) by mouth 4 (four) times daily for 4 days.            Durable Medical Equipment  (From admission,  onward)        Start     Ordered   11/08/17 0804  For home use only DME 4 wheeled rolling walker with seat  Once    Question:  Patient needs a walker to treat with the following condition  Answer:  Debility   11/08/17 0804      Contact information for follow-up providers    Harrison Mons, PA-C. Schedule an appointment as soon as possible for a visit in 1 week(s).   Specialty:  Family Medicine Contact information: [address] [place] [address] [phone]            Contact information for after-discharge care    [facility] Follow up.   Service:  Skilled Nursing Contact information: [address] [place] [phone]                   The results of significant diagnostics from this hospitalization (including imaging, microbiology, ancillary and laboratory) are listed below for reference.    Significant Diagnostic Studies: Dg Chest 2 View  Result Date: 11/07/2017 CLINICAL DATA:  Cough, shortness of Breath EXAM: CHEST  2 VIEW COMPARISON:  11/06/2017 FINDINGS: Right Port-A-Cath remains in place, unchanged. Heart is normal size. Patchy bilateral airspace opacities are again noted, unchanged. Small bilateral effusions, best seen on the lateral view. IMPRESSION: Stable patchy bilateral airspace disease. Small layering effusions bilaterally. Electronically Signed   By: Rolm Baptise M.D.   On: 11/07/2017 10:40   Dg Chest 2 View  Result Date: 11/06/2017 CLINICAL DATA:  Weakness, history of lung carcinoma with metastatic disease EXAM: CHEST  2 VIEW COMPARISON:  05/12/2017 CT of the chest FINDINGS: Cardiac shadow is stable. The lungs are again well aerated with some chronic changes and elevation of the right hemidiaphragm. Small right-sided pleural effusion is noted which appears new from the prior CT patchy densities are noted in the left lung which correspond to known lung lesions. No focal confluent  infiltrate is seen. Small left pleural effusion is noted on the lateral film. IMPRESSION: Small bilateral pleural effusions right greater than left. There may be some underlying infiltrative changes on the right. Stable vague densities  corresponding to known left-sided parenchymal lesions. Electronically Signed   By: Inez Catalina M.D.   On: 11/06/2017 15:07   Mr Lumbar Spine Wo Contrast  Result Date: 11/11/2017 CLINICAL DATA:  77 year old female with metastatic lung cancer. Status post palliative radiotherapy in October 2018 to the lumbar spine from L2 to the sacrum. Increased weakness. EXAM: MRI LUMBAR SPINE WITHOUT CONTRAST TECHNIQUE: Multiplanar, multisequence MR imaging of the lumbar spine was performed. No intravenous contrast was administered. COMPARISON:  Pretreatment lumbar MRI 07/10/2017. lumbar radiographs 04/28/2017 FINDINGS: Segmentation: Normal on the comparison radiographs which is the same numbering system used on 07/10/2017. Alignment: Stable lumbar vertebral height and alignment since September. Vertebrae: Marrow signal remains diffusely abnormal in the L3 vertebral body, pedicles, and lamina. However, bulky epidural and paraspinal extraosseous tumor at that level has regressed since September. Associated spinal stenosis has resolved. There is residual central and foraminal epidural tumor slightly greater on the left (series 7, images 19 and 20). Bilateral L2 neural foraminal stenosis has regressed. Regressed and now subcentimeter left L5 transverse process metastasis (series 7, image 29). Mildly increased size but decreased signal throughout the right posterior S1 metastasis (16 mm now versus 8 mm) compatible with sclerotic change following treatment. Elsewhere in the lumbar spine from L2 through the sacrum marrow signal appears normal. Visible SI joints are intact. Marrow signal remains within normal limits at T12 and L1. However, there is nonspecific new decreased T1 marrow signal in the  visible right iliac wing (series 7, image 41). Conus medullaris and cauda equina: Conus extends to the L1-L2 level. Conus and cauda equina appear normal. Paraspinal and other soft tissues: Moderate volume free fluid in the visible pelvis. Associated nonspecific presacral stranding. Intermittent fluid-filled but nondilated visible small bowel. Nonspecific bilateral perinephric stranding. Multiple mildly T2 hyperintense and T1 hypointense liver lesions are compatible with liver metastases and were not evident on the 07/10/2017 study. Patchy and confluent nonspecific left psoas muscle edema tracking from the L3 spinous process inferiorly. The psoas muscle is non enlarged. This most resembles interval muscle atrophy. However, there also is superimposed new bilateral lumbar erector spinae muscle T2 and FLAIR hyperintensity. Little to no muscle expansion. Disc levels: Resolved malignant spinal stenosis at the L3 vertebral level as stated above. Stable superimposed lumbar spine degeneration since 2018. IMPRESSION: 1. Possible new or progressed osseous metastatic disease in the right iliac wing and the liver since September 2018. 2. Regression of the L3 metastasis following radiation treatment. Resolved malignant spinal stenosis. Substantially reduced but not fully resolved epidural and bilateral L2 neural foraminal tumor. 3. Satisfactory post treatment appearance of the smaller left L5 and right S1 metastases. No new metastatic disease in the lumbar spine. 4. Nonspecific new signal abnormality in the bilateral erector spinae muscles and left psoas muscle, perhaps due to interval muscle atrophy. 5. Nonspecific pelvic free fluid. Electronically Signed   By: Genevie Ann M.D.   On: 11/11/2017 09:06   Dg Abd 2 Views  Result Date: 11/06/2017 CLINICAL DATA:  77 year old female with a history weakness EXAM: ABDOMEN - 2 VIEW COMPARISON:  05/25/2014 FINDINGS: Gas within stomach, small bowel, colon. No abnormal distention. Decubitus  image demonstrates no significant air-fluid levels. No radiopaque foreign body. No unexpected calcification. No unexpected soft tissue density. Calcified fibroids. Multilevel degenerative changes of the spine. No acute displaced fracture. Vascular calcifications. IMPRESSION: Nonobstructive bowel gas pattern. Electronically Signed   By: Corrie Mckusick D.O.   On: 11/06/2017 15:01    Microbiology: Recent Results (from the  past 240 hour(s))  Culture, blood (Routine X 2) w Reflex to ID Panel     Status: None   Collection Time: 11/06/17 11:30 AM  Result Value Ref Range Status   Specimen Description BLOOD BLOOD RIGHT ARM  Final   Special Requests   Final    BOTTLES DRAWN AEROBIC AND ANAEROBIC Blood Culture adequate volume   Culture   Final    NO GROWTH 5 DAYS Performed at [facility], [address], [place], [place] [address]    Report Status 11/11/2017 FINAL  Final  Culture, blood (Routine X 2) w Reflex to ID Panel     Status: None   Collection Time: 11/06/17 11:42 AM  Result Value Ref Range Status   Specimen Description BLOOD PORT  Final   Special Requests   Final    BOTTLES DRAWN AEROBIC AND ANAEROBIC Blood Culture adequate volume   Culture   Final    NO GROWTH 5 DAYS Performed at [facility], [place] [address], [place], [place] [address]    Report Status 11/11/2017 FINAL  Final  Respiratory Panel by PCR     Status: None   Collection Time: 11/06/17  6:13 PM  Result Value Ref Range Status   Adenovirus NOT DETECTED NOT DETECTED Final   Coronavirus 229E NOT DETECTED NOT DETECTED Final   Coronavirus HKU1 NOT DETECTED NOT DETECTED Final   Coronavirus NL63 NOT DETECTED NOT DETECTED Final   Coronavirus OC43 NOT DETECTED NOT DETECTED Final   Metapneumovirus NOT DETECTED NOT DETECTED Final   Rhinovirus / Enterovirus NOT DETECTED NOT DETECTED Final   Influenza A NOT DETECTED NOT DETECTED Final   Influenza B NOT DETECTED NOT DETECTED Final   Parainfluenza Virus 1 NOT DETECTED NOT  DETECTED Final   Parainfluenza Virus 2 NOT DETECTED NOT DETECTED Final   Parainfluenza Virus 3 NOT DETECTED NOT DETECTED Final   Parainfluenza Virus 4 NOT DETECTED NOT DETECTED Final   Respiratory Syncytial Virus NOT DETECTED NOT DETECTED Final   Bordetella pertussis NOT DETECTED NOT DETECTED Final   Chlamydophila pneumoniae NOT DETECTED NOT DETECTED Final   Mycoplasma pneumoniae NOT DETECTED NOT DETECTED Final    Comment: Performed at [facility], [place] [address] [place], [place] [address]  Culture, Urine     Status: Abnormal   Collection Time: 11/06/17  6:14 PM  Result Value Ref Range Status   Specimen Description URINE, CLEAN CATCH  Final   Special Requests NONE  Final   Culture MULTIPLE SPECIES PRESENT, SUGGEST RECOLLECTION (A)  Final   Report Status 11/08/2017 FINAL  Final  MRSA PCR Screening     Status: None   Collection Time: 11/07/17 12:34 AM  Result Value Ref Range Status   MRSA by PCR NEGATIVE NEGATIVE Final    Comment:        The GeneXpert MRSA Assay (FDA approved for NASAL specimens only), is one component of a comprehensive MRSA colonization surveillance program. It is not intended to diagnose MRSA infection nor to guide or monitor treatment for MRSA infections.   Culture, sputum-assessment     Status: None   Collection Time: 11/08/17  5:15 AM  Result Value Ref Range Status   Specimen Description SPUTUM  Final   Special Requests NONE  Final   Sputum evaluation THIS SPECIMEN IS ACCEPTABLE FOR SPUTUM CULTURE  Final   Report Status 11/08/2017 FINAL  Final  Culture, respiratory (NON-Expectorated)     Status: None   Collection Time: 11/08/17  5:15 AM  Result Value Ref Range Status   Specimen Description SPUTUM  Final   Special Requests NONE Reflexed from [id number]  Final   Gram Stain   Final    ABUNDANT WBC PRESENT, PREDOMINANTLY PMN NO SQUAMOUS EPITHELIAL CELLS SEEN RARE GRAM POSITIVE COCCI IN PAIRS Performed at [facility], [address]  [place], [place] [address]    Culture FEW CANDIDA TROPICALIS  Final   Report Status 11/10/2017 FINAL  Final     Labs: Basic Metabolic Panel: Recent Labs  Lab 11/06/17 1144 11/06/17 2000 11/07/17 0901 11/08/17 0500 11/09/17 0400 11/10/17 0607 11/11/17 0600  NA 141  --  140 142 138 137 136  K 2.9*  --  3.5 4.0 4.6 3.9 3.9  CL 109  --  111 113* 107 105 104  CO2 24  --  22 24 26 26 26   GLUCOSE 88  --  88 97 99 93 91  BUN 9  --  7 8 7 8 8   CREATININE 0.70  --  0.67 0.62 0.70 0.69 0.65  CALCIUM 8.2*  --  8.1* 7.9* 8.4* 8.4* 8.6*  MG 2.0 1.9  --   --  1.8  --   --   PHOS  --  2.3*  --  2.3* 3.1  --   --    Liver Function Tests: Recent Labs  Lab 11/06/17 1144 11/07/17 0901  AST 11* 10*  ALT 6* 6*  ALKPHOS 102 111  BILITOT 0.3 0.4  PROT 5.4* 5.4*  ALBUMIN 2.7* 2.6*   No results for input(s): LIPASE, AMYLASE in the last 168 hours. No results for input(s): AMMONIA in the last 168 hours. CBC: Recent Labs  Lab 11/07/17 0901 11/08/17 0500 11/09/17 0400 11/10/17 0607 11/11/17 0600  WBC 15.4* 15.5* 15.1* 15.5* 15.1*  NEUTROABS  --   --  12.8*  --   --   HGB 10.4* 9.9* 10.3* 10.2* 10.3*  HCT 33.2* 31.5* 33.2* 31.9* 31.5*  MCV 86.7 85.6 86.5 85.3 85.6  PLT 281 295 261 237 203   Cardiac Enzymes: No results for input(s): CKTOTAL, CKMB, CKMBINDEX, TROPONINI in the last 168 hours. BNP: BNP (last 3 results) No results for input(s): BNP in the last 8760 hours.  ProBNP (last 3 results) No results for input(s): PROBNP in the last 8760 hours.  CBG: Recent Labs  Lab 11/06/17 1147  GLUCAP 88

## 2017-11-17 ENCOUNTER — Other Ambulatory Visit: Payer: Medicare Other

## 2017-11-17 ENCOUNTER — Other Ambulatory Visit: Payer: Self-pay | Admitting: Medical Oncology

## 2017-11-17 ENCOUNTER — Telehealth: Payer: Self-pay | Admitting: Medical Oncology

## 2017-11-17 NOTE — Telephone Encounter (Signed)
2-3 weeks. Reschedule scan before visit.

## 2017-11-17 NOTE — Telephone Encounter (Signed)
Pt back at [facility] after d/c from hospital for c diff. She did not get her scan. When does Mohamed want to see her next with scan.

## 2017-11-19 ENCOUNTER — Telehealth: Payer: Self-pay | Admitting: Internal Medicine

## 2017-11-19 ENCOUNTER — Telehealth: Payer: Self-pay | Admitting: Medical Oncology

## 2017-11-19 NOTE — Telephone Encounter (Signed)
Scheduled appt per 2/6 sch message - patients daughter is aware of appt date and time. Patient is aware that they need to contact [facility] for [facility] - gave daughter number for [facility]

## 2017-11-19 NOTE — Telephone Encounter (Signed)
Daughter notified of lab appt and MD appt. I told her someone from central scheduling will call her with scan appt.

## 2017-11-24 ENCOUNTER — Ambulatory Visit: Payer: Medicare Other | Admitting: Internal Medicine

## 2017-12-02 ENCOUNTER — Encounter (HOSPITAL_COMMUNITY): Payer: Self-pay | Admitting: Emergency Medicine

## 2017-12-02 ENCOUNTER — Emergency Department (HOSPITAL_COMMUNITY): Payer: Medicare Other

## 2017-12-02 ENCOUNTER — Inpatient Hospital Stay (HOSPITAL_COMMUNITY)
Admission: EM | Admit: 2017-12-02 | Discharge: 2017-12-12 | DRG: 189 | Disposition: E | Payer: Medicare Other | Attending: Internal Medicine | Admitting: Internal Medicine

## 2017-12-02 ENCOUNTER — Other Ambulatory Visit: Payer: Self-pay

## 2017-12-02 DIAGNOSIS — Z888 Allergy status to other drugs, medicaments and biological substances status: Secondary | ICD-10-CM

## 2017-12-02 DIAGNOSIS — Z66 Do not resuscitate: Secondary | ICD-10-CM | POA: Diagnosis present

## 2017-12-02 DIAGNOSIS — J9601 Acute respiratory failure with hypoxia: Secondary | ICD-10-CM | POA: Diagnosis present

## 2017-12-02 DIAGNOSIS — R0602 Shortness of breath: Secondary | ICD-10-CM

## 2017-12-02 DIAGNOSIS — Z86718 Personal history of other venous thrombosis and embolism: Secondary | ICD-10-CM

## 2017-12-02 DIAGNOSIS — H409 Unspecified glaucoma: Secondary | ICD-10-CM | POA: Diagnosis present

## 2017-12-02 DIAGNOSIS — Z79890 Hormone replacement therapy (postmenopausal): Secondary | ICD-10-CM

## 2017-12-02 DIAGNOSIS — C3491 Malignant neoplasm of unspecified part of right bronchus or lung: Secondary | ICD-10-CM | POA: Diagnosis present

## 2017-12-02 DIAGNOSIS — Z809 Family history of malignant neoplasm, unspecified: Secondary | ICD-10-CM

## 2017-12-02 DIAGNOSIS — Z881 Allergy status to other antibiotic agents status: Secondary | ICD-10-CM

## 2017-12-02 DIAGNOSIS — C3492 Malignant neoplasm of unspecified part of left bronchus or lung: Secondary | ICD-10-CM | POA: Diagnosis present

## 2017-12-02 DIAGNOSIS — J90 Pleural effusion, not elsewhere classified: Secondary | ICD-10-CM | POA: Diagnosis not present

## 2017-12-02 DIAGNOSIS — E039 Hypothyroidism, unspecified: Secondary | ICD-10-CM | POA: Diagnosis present

## 2017-12-02 DIAGNOSIS — C3490 Malignant neoplasm of unspecified part of unspecified bronchus or lung: Secondary | ICD-10-CM | POA: Diagnosis not present

## 2017-12-02 DIAGNOSIS — Z902 Acquired absence of lung [part of]: Secondary | ICD-10-CM

## 2017-12-02 DIAGNOSIS — I10 Essential (primary) hypertension: Secondary | ICD-10-CM | POA: Diagnosis present

## 2017-12-02 DIAGNOSIS — C7951 Secondary malignant neoplasm of bone: Secondary | ICD-10-CM | POA: Diagnosis not present

## 2017-12-02 DIAGNOSIS — Z87891 Personal history of nicotine dependence: Secondary | ICD-10-CM

## 2017-12-02 DIAGNOSIS — Z7951 Long term (current) use of inhaled steroids: Secondary | ICD-10-CM

## 2017-12-02 DIAGNOSIS — Z8249 Family history of ischemic heart disease and other diseases of the circulatory system: Secondary | ICD-10-CM

## 2017-12-02 DIAGNOSIS — Z86711 Personal history of pulmonary embolism: Secondary | ICD-10-CM

## 2017-12-02 DIAGNOSIS — Z853 Personal history of malignant neoplasm of breast: Secondary | ICD-10-CM

## 2017-12-02 DIAGNOSIS — I959 Hypotension, unspecified: Secondary | ICD-10-CM | POA: Diagnosis present

## 2017-12-02 DIAGNOSIS — E7800 Pure hypercholesterolemia, unspecified: Secondary | ICD-10-CM | POA: Diagnosis present

## 2017-12-02 DIAGNOSIS — Z9221 Personal history of antineoplastic chemotherapy: Secondary | ICD-10-CM

## 2017-12-02 DIAGNOSIS — Z79891 Long term (current) use of opiate analgesic: Secondary | ICD-10-CM

## 2017-12-02 DIAGNOSIS — Z515 Encounter for palliative care: Secondary | ICD-10-CM | POA: Diagnosis not present

## 2017-12-02 DIAGNOSIS — Z885 Allergy status to narcotic agent status: Secondary | ICD-10-CM

## 2017-12-02 DIAGNOSIS — Z91013 Allergy to seafood: Secondary | ICD-10-CM

## 2017-12-02 LAB — CBC WITH DIFFERENTIAL/PLATELET
Basophils Absolute: 0 10*3/uL (ref 0.0–0.1)
Basophils Relative: 0 %
EOS PCT: 0 %
Eosinophils Absolute: 0 10*3/uL (ref 0.0–0.7)
HEMATOCRIT: 34.3 % — AB (ref 36.0–46.0)
HEMOGLOBIN: 10.5 g/dL — AB (ref 12.0–15.0)
LYMPHS ABS: 0.8 10*3/uL (ref 0.7–4.0)
LYMPHS PCT: 4 %
MCH: 26.7 pg (ref 26.0–34.0)
MCHC: 30.6 g/dL (ref 30.0–36.0)
MCV: 87.3 fL (ref 78.0–100.0)
Monocytes Absolute: 0.4 10*3/uL (ref 0.1–1.0)
Monocytes Relative: 2 %
NEUTROS ABS: 19.1 10*3/uL — AB (ref 1.7–7.7)
Neutrophils Relative %: 94 %
Platelets: 166 10*3/uL (ref 150–400)
RBC: 3.93 MIL/uL (ref 3.87–5.11)
RDW: 19.8 % — ABNORMAL HIGH (ref 11.5–15.5)
WBC: 20.3 10*3/uL — ABNORMAL HIGH (ref 4.0–10.5)

## 2017-12-02 LAB — COMPREHENSIVE METABOLIC PANEL
ALT: 7 U/L — AB (ref 14–54)
AST: 9 U/L — ABNORMAL LOW (ref 15–41)
Albumin: 2.1 g/dL — ABNORMAL LOW (ref 3.5–5.0)
Alkaline Phosphatase: 108 U/L (ref 38–126)
Anion gap: 14 (ref 5–15)
BUN: 40 mg/dL — AB (ref 6–20)
CHLORIDE: 107 mmol/L (ref 101–111)
CO2: 23 mmol/L (ref 22–32)
CREATININE: 1.04 mg/dL — AB (ref 0.44–1.00)
Calcium: 8.7 mg/dL — ABNORMAL LOW (ref 8.9–10.3)
GFR calc Af Amer: 58 mL/min — ABNORMAL LOW (ref >60)
GFR calc non Af Amer: 50 mL/min — ABNORMAL LOW (ref >60)
Glucose, Bld: 108 mg/dL — ABNORMAL HIGH (ref 65–99)
POTASSIUM: 3.1 mmol/L — AB (ref 3.5–5.1)
Sodium: 144 mmol/L (ref 135–145)
Total Bilirubin: 0.5 mg/dL (ref 0.3–1.2)
Total Protein: 5 g/dL — ABNORMAL LOW (ref 6.5–8.1)

## 2017-12-02 LAB — URINALYSIS, ROUTINE W REFLEX MICROSCOPIC
BILIRUBIN URINE: NEGATIVE
GLUCOSE, UA: NEGATIVE mg/dL
HGB URINE DIPSTICK: NEGATIVE
KETONES UR: NEGATIVE mg/dL
NITRITE: NEGATIVE
PH: 5 (ref 5.0–8.0)
Protein, ur: NEGATIVE mg/dL
SPECIFIC GRAVITY, URINE: 1.021 (ref 1.005–1.030)

## 2017-12-02 LAB — MRSA PCR SCREENING: MRSA BY PCR: NEGATIVE

## 2017-12-02 LAB — PROTIME-INR
INR: 1.55
PROTHROMBIN TIME: 18.5 s — AB (ref 11.4–15.2)

## 2017-12-02 LAB — I-STAT CG4 LACTIC ACID, ED: Lactic Acid, Venous: 1.17 mmol/L (ref 0.5–1.9)

## 2017-12-02 MED ORDER — ONDANSETRON HCL 4 MG/2ML IJ SOLN: 4.0000 mg | Freq: Four times a day (QID) | INTRAMUSCULAR | Status: DC | PRN

## 2017-12-02 MED ORDER — ONDANSETRON HCL 4 MG PO TABS: 4.0000 mg | ORAL_TABLET | Freq: Four times a day (QID) | ORAL | Status: DC | PRN

## 2017-12-02 MED ORDER — ALBUTEROL SULFATE (2.5 MG/3ML) 0.083% IN NEBU
2.5000 mg | INHALATION_SOLUTION | RESPIRATORY_TRACT | Status: DC | PRN
  Filled 2017-12-02: qty 3

## 2017-12-02 MED ORDER — SODIUM CHLORIDE 0.9% FLUSH: 10.0000 mL | INTRAVENOUS | Status: DC | PRN

## 2017-12-02 MED ORDER — MAGIC MOUTHWASH
15.0000 mL | Freq: Four times a day (QID) | ORAL | Status: DC | PRN
  Administered 2017-12-03: 15 mL via ORAL
  Filled 2017-12-02 (×2): qty 15

## 2017-12-02 MED ORDER — HALOPERIDOL LACTATE 5 MG/ML IJ SOLN
0.5000 mg | INTRAMUSCULAR | Status: DC | PRN
  Administered 2017-12-03: 0.5 mg via INTRAVENOUS
  Filled 2017-12-02: qty 1

## 2017-12-02 MED ORDER — ACETAMINOPHEN 650 MG RE SUPP: 650.0000 mg | Freq: Four times a day (QID) | RECTAL | Status: DC | PRN

## 2017-12-02 MED ORDER — BISACODYL 10 MG RE SUPP: 10.0000 mg | Freq: Every day | RECTAL | Status: DC | PRN

## 2017-12-02 MED ORDER — LORAZEPAM 2 MG/ML PO CONC: 1.0000 mg | ORAL | Status: DC | PRN

## 2017-12-02 MED ORDER — HALOPERIDOL 0.5 MG PO TABS: 0.5000 mg | ORAL_TABLET | ORAL | Status: DC | PRN

## 2017-12-02 MED ORDER — LORAZEPAM 1 MG PO TABS: 1.0000 mg | ORAL_TABLET | ORAL | Status: DC | PRN

## 2017-12-02 MED ORDER — LORAZEPAM 2 MG/ML IJ SOLN
1.0000 mg | INTRAMUSCULAR | Status: DC | PRN
  Administered 2017-12-03 – 2017-12-04 (×2): 1 mg via INTRAVENOUS
  Filled 2017-12-02 (×2): qty 1

## 2017-12-02 MED ORDER — TEMAZEPAM 15 MG PO CAPS: 15.0000 mg | ORAL_CAPSULE | Freq: Every evening | ORAL | Status: DC | PRN

## 2017-12-02 MED ORDER — ACETAMINOPHEN 325 MG PO TABS: 650.0000 mg | ORAL_TABLET | Freq: Four times a day (QID) | ORAL | Status: DC | PRN

## 2017-12-02 MED ORDER — HALOPERIDOL LACTATE 2 MG/ML PO CONC
0.5000 mg | ORAL | Status: DC | PRN
  Filled 2017-12-02: qty 0.3

## 2017-12-02 MED ORDER — DIPHENHYDRAMINE HCL 50 MG/ML IJ SOLN
12.5000 mg | INTRAMUSCULAR | Status: DC | PRN
Start: 2017-12-02 — End: 2017-12-05

## 2017-12-02 MED ORDER — ALBUTEROL SULFATE (2.5 MG/3ML) 0.083% IN NEBU
5.0000 mg | INHALATION_SOLUTION | Freq: Once | RESPIRATORY_TRACT | Status: DC
  Filled 2017-12-02: qty 6

## 2017-12-02 NOTE — Care Management (Signed)
ED CM received handoff from daytime ED CM concerning patient awaiting return to [facility] with home hospice. CM was made aware that the facility was refusing to take patient back unless  She was cleared for C-Diff and set up with home hospice.  CM contacted [facility] spoke with [facility] regarding patient's return, and was made aware that patient is currently a patient at [facility] and [facility].  Patient has a bed at [facility] but has been cleared to return there. CM contacted [facility] spoke with Mora Bellman, she was informed that patient is still at [facility], to hold on home hospice orders.  Updated EDP about patient does not meet criteria for Home Hospice at this time. CM met with patient and daughter Ezzard Flax at bedside who verified that patient is still at [facility]. Explained that the EDP will come and speak with them to discuss the plan of care.  Plan of care is for end of life comfort care, as per EDP discussed with family, and he has requested hospice consult for comfort care. EDP plans to consult with Hospitalist regarding end of life care.

## 2017-12-02 NOTE — Discharge Planning (Signed)
EDCM contacted Glyn Ade, RN liaison to confirm that pt may return to [facility] upon discharge.  Dorian Pod states that pt needs to be cleared of C-diff and set up with home hospice services.     EDCM spoke with Erling Conte of [facility] to initiate home hospice services.  Harmon Pier will update [facility] with plan of care.

## 2017-12-02 NOTE — ED Notes (Addendum)
Unable to draw back blood from accessed port.

## 2017-12-02 NOTE — ED Provider Notes (Signed)
 [facility] EMERGENCY DEPARTMENT Provider Note   CSN: [id number] Arrival date & time: 11/19/2017  0846     History   Chief Complaint Chief Complaint  Patient presents with  . Shortness of Breath  . Cough    HPI [name] is a 77 y.o. female.  HPI Patient presents to the emergency department with dyspnea and shortness of breath that is increased over the last 5 days.  The patient left the hospital on oxygen.  She has not been taken off the oxygen since leaving.  The patient has stopped eating over the last few weeks.  The patient is unable to give me much history.  The daughter gives me the full history that she knows of.  The daughter states that she saw her last night and she seemed to be having difficulty breathing at that time. Past Medical History:  Diagnosis Date  . Bilateral lung cancer ([place]) 03/08/2011   recurrent  . Bone cancer ([place])    lung ca with spine mets  . Chronic fatigue 02/14/2016  . Dysuria 03/04/2017  . Encounter for antineoplastic immunotherapy 02/14/2016  . Foot pain 09/02/2011  . Glaucoma   . Hip pain 09/02/2011  . Hypercholesterolemia   . Hypertension   . Hypertension 10/29/2016  . Hypothyroidism   . Lung cancer ([place]) 03/08/2011   recurrent    Patient Active Problem List   Diagnosis Date Noted  . Pressure injury of skin 11/07/2017  . Malnutrition of moderate degree 11/07/2017  . Weakness 11/06/2017  . Bronchitis, acute 11/06/2017  . C. difficile colitis: Per patient tested positive last week. 11/06/2017  . Anemia 11/06/2017  . Clostridium difficile diarrhea   . Generalized weakness   . DVT (deep venous thrombosis) ([place]) 09/03/2017  . Acute bilateral low back pain with bilateral sciatica   . Hypothyroidism   . Dehydration   . AKI (acute kidney injury) ([place])   . UTI (urinary tract infection) 07/15/2017  . 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 Muscle weakness (generalized) 06/06/2017  . Multiple falls 06/06/2017    . DDD (degenerative disc disease), lumbosacral 05/15/2017  . Incontinence of feces 04/23/2017  . Vaginal atrophy 04/23/2017  . Voiding dysfunction 04/23/2017  . Recurrent UTI 04/23/2017  . Dysuria 03/04/2017  . Hypertension 10/29/2016  . Encounter for antineoplastic immunotherapy 02/14/2016  . Chronic fatigue 02/14/2016  . Port catheter in place 02/09/2016  . Urine frequency 07/06/2014  . Diarrhea 05/24/2014  . Nausea and vomiting 05/24/2014  . Sepsis ([place]) 05/24/2014  . Hypothermia 05/24/2014  . Hypokalemia 05/24/2014  . Nausea vomiting and diarrhea 05/24/2014  . Proteinuria 04/28/2012  . Vitamin B12 deficiency 03/17/2012  . Deficiency anemia 12/02/2011  . 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 Multinodular goiter 11/29/2011  . Numbness 11/29/2011  . Acute pulmonary embolism ([place]) 11/04/2011  . Acute pulmonary embolism ([place]) 11/04/2011  . Hip pain 09/02/2011  . Foot pain 09/02/2011  . Bilateral lung cancer ([place]) 03/08/2011    Past Surgical History:  Procedure Laterality Date  . LUNG LOBECTOMY  09/28/2009   right upper  . THORACOTOMY  09/28/2009   mini  . VIDEO ASSISTED THORACOSCOPY  09/28/2009    OB History    No data available       Home Medications    Prior to Admission medications   Medication Sig Start Date End Date Taking? Authorizing Provider  acetaminophen (TYLENOL) 500 MG tablet Take 500 mg by mouth every 4 (four) hours as needed for  moderate pain, fever or headache.    Yes [provider]  albuterol (PROVENTIL HFA;VENTOLIN HFA) 108 (90 Base) MCG/ACT inhaler Inhale 2 puffs into the lungs every 4 (four) hours as needed for wheezing or shortness of breath.   Yes [provider]  albuterol (PROVENTIL) (2.5 MG/3ML) 0.083% nebulizer solution Take 2.5 mg by nebulization every 2 (two) hours as needed for wheezing or shortness of breath.   Yes [provider]  amLODipine (NORVASC) 5 MG tablet Take 2 tablets (10 mg total) by  mouth daily. Per [name], NP 09/18/17  Yes Jacqulynn Cadet, Chelle, PA-C  benzonatate (TESSALON) 100 MG capsule Take 1-2 capsules (100-200 mg total) by mouth 3 (three) times daily as needed for cough. Patient taking differently: Take 100-200 mg by mouth every 8 (eight) hours as needed for cough.  10/15/17  Yes Jeffery, Chelle, PA-C  clonazePAM (KLONOPIN) 1 MG tablet Take 1 tablet (1 mg total) by mouth at bedtime. 11/12/17  Yes Robbie Lis, MD  dorzolamide-timolol (COSOPT) 22.3-6.8 MG/ML ophthalmic solution Place 2 drops into both eyes 2 (two) times daily. Per [name], NP 09/18/17  Yes Jacqulynn Cadet, Chelle, PA-C  enoxaparin (LOVENOX) 80 MG/0.8ML injection Inject 80 mg into the skin daily.    Yes [provider]  feeding supplement, ENSURE ENLIVE, (ENSURE ENLIVE) LIQD Take 237 mLs by mouth 2 (two) times daily between meals. 07/18/17  Yes Eugenie Filler, MD  fluticasone [facility]) 50 MCG/ACT nasal spray Place 1 spray into both nostrils daily as needed for allergies or rhinitis.   Yes [provider]  Guaifenesin (MUCINEX MAXIMUM STRENGTH) 1200 MG TB12 Take 1 tablet (1,200 mg total) by mouth every 12 (twelve) hours as needed. Patient taking differently: Take 1 tablet by mouth every 12 (twelve) hours as needed.  10/15/17  Yes Jeffery, Chelle, PA-C  guaifenesin (ROBITUSSIN) 100 MG/5ML syrup Take 200 mg by mouth every 4 (four) hours as needed for cough.   Yes [provider]  HYDROcodone-homatropine (HYCODAN) 5-1.5 MG/5ML syrup Take 5 mLs by mouth 3 (three) times daily. 11/12/17  Yes Robbie Lis, MD  ipratropium (ATROVENT) 0.02 % nebulizer solution Take 2.5 mLs (0.5 mg total) by nebulization every 2 (two) hours as needed for wheezing or shortness of breath (use if refractory today xopenex). 11/12/17  Yes Robbie Lis, MD  ipratropium (ATROVENT) 0.02 % nebulizer solution Take 2.5 mLs (0.5 mg total) by nebulization 2 (two) times daily. 11/12/17  Yes Robbie Lis, MD  levothyroxine  (SYNTHROID, LEVOTHROID) 88 MCG tablet Take 1 tablet (88 mcg total) by mouth daily before breakfast. 09/18/17  Yes Jeffery, Chelle, PA-C  lidocaine-prilocaine (EMLA) cream Apply 1 application topically as needed (prior to port access). Patient taking differently: Apply 1 application topically daily as needed (prior to port access).  03/05/16  Yes Curt Bears, MD  lisinopril (PRINIVIL,ZESTRIL) 40 MG tablet Take 1 tablet (40 mg total) by mouth daily. 09/18/17  Yes Jeffery, Chelle, PA-C  Loperamide HCl (LOPERAMIDE A-D PO) Take 2 mg by mouth every 3 (three) hours as needed (for diarrhea).    Yes [provider]  magnesium hydroxide (MILK OF MAGNESIA) 400 MG/5ML suspension Take 30 mLs by mouth daily as needed for mild constipation.   Yes [provider]  metroNIDAZOLE (FLAGYL) 500 MG tablet Take 500 mg by mouth 3 (three) times daily.   Yes [provider]  mirtazapine (REMERON SOL-TAB) 15 MG disintegrating tablet Take 1 tablet (15 mg total) by mouth at bedtime. 11/12/17  Yes  Robbie Lis, MD  ondansetron (ZOFRAN) 4 MG tablet Take 1 tablet (4 mg total) by mouth every 6 (six) hours as needed for nausea. 11/12/17  Yes Robbie Lis, MD  promethazine (PHENERGAN) 12.5 MG tablet Take 12.5 mg by mouth every 6 (six) hours as needed for nausea or vomiting.   Yes [provider]  timolol (BETIMOL) 0.5 % ophthalmic solution Place 1 drop into both eyes 2 (two) times daily.   Yes [provider]  traMADol (ULTRAM) 50 MG tablet Take 1 tablet (50 mg total) by mouth every 6 (six) hours as needed for severe pain. 11/12/17  Yes Robbie Lis, MD  amoxicillin-clavulanate (AUGMENTIN) 875-125 MG tablet Take 1 tablet by mouth every 12 (twelve) hours. Patient not taking: Reported on 12/01/2017 11/12/17   Robbie Lis, MD  cyanocobalamin (,VITAMIN B-12,) 1000 MCG/ML injection Inject 1,000 mcg into the muscle every 30 (thirty) days.     [provider]  guaiFENesin-codeine  (ROBAFEN AC) 100-10 MG/5ML syrup Take 10 mLs by mouth 4 (four) times daily as needed for cough.     [provider]  levalbuterol Penne Lash) 0.63 MG/3ML nebulizer solution Take 3 mLs (0.63 mg total) by nebulization every 2 (two) hours as needed for wheezing or shortness of breath. Patient not taking: Reported on 11/27/2017 11/12/17   Robbie Lis, MD  levalbuterol Penne Lash) 1.25 MG/0.5ML nebulizer solution Take 1.25 mg by nebulization 2 (two) times daily. Patient not taking: Reported on  11/12/17   Robbie Lis, MD  vancomycin (VANCOCIN) 50 mg/mL oral solution Take 125 mg by mouth 4 (four) times daily.    [provider]    Family History Family History  Problem Relation Age of Onset  . Stroke Father   . Heart disease Father 27       MI  . Cancer Brother        lung  . Cancer Cousin        unknown    Social History Social History   Tobacco Use  . Smoking status: Former Smoker    Packs/day: 1.00    Years: 50.00    Pack years: 50.00    Types: Cigarettes    Last attempt to quit: 09/28/2009    Years since quitting: 8.1  . Smokeless tobacco: Never Used  Substance Use Topics  . Alcohol use: No  . Drug use: No     Allergies   Cymbalta [duloxetine hcl]; Fish allergy; Amitriptyline; Codeine; Mirabegron; Solifenacin; and Sulfa antibiotics   Review of Systems Review of Systems Level 5 caveat applies due to significant illness  Physical Exam Updated Vital Signs BP (!) 86/48   Pulse 95   Temp 97.6 F (36.4 C) (Rectal)   Resp (!) 31   Ht 5\' 5"  (1.651 m)   Wt 59.9 kg (132 lb)   SpO2 96%   BMI 21.97 kg/m   Physical Exam  Constitutional: She appears well-developed and well-nourished. No distress.  HENT:  Head: Normocephalic and atraumatic.  Mouth/Throat: Oropharynx is clear and moist.  Eyes: Pupils are equal, round, and reactive to light.  Neck: Normal range of motion. Neck supple.  Cardiovascular: Normal rate, regular rhythm and normal  heart sounds. Exam reveals no gallop and no friction rub.  No murmur heard. Pulmonary/Chest: Effort normal. No respiratory distress. She has no wheezes. She has rhonchi. She has rales.  Abdominal: Soft. Bowel sounds are normal. She exhibits no distension. There is no tenderness.  Neurological: She is alert.  She exhibits normal muscle tone. Coordination normal.  Skin: Skin is warm and dry. Capillary refill takes less than 2 seconds. No rash noted. No erythema.  Psychiatric: She has a normal mood and affect. Her behavior is normal.  Nursing note and vitals reviewed.    ED Treatments / Results  Labs (all labs ordered are listed, but only abnormal results are displayed) Labs Reviewed  COMPREHENSIVE METABOLIC PANEL - Abnormal; Notable for the following components:      Result Value   Potassium 3.1 (*)    Glucose, Bld 108 (*)    BUN 40 (*)    Creatinine, Ser 1.04 (*)    Calcium 8.7 (*)    Total Protein 5.0 (*)    Albumin 2.1 (*)    AST 9 (*)    ALT 7 (*)    GFR calc non Af Amer 50 (*)    GFR calc Af Amer 58 (*)    All other components within normal limits  CBC WITH DIFFERENTIAL/PLATELET - Abnormal; Notable for the following components:   WBC 20.3 (*)    Hemoglobin 10.5 (*)    HCT 34.3 (*)    RDW 19.8 (*)    Neutro Abs 19.1 (*)    All other components within normal limits  PROTIME-INR - Abnormal; Notable for the following components:   Prothrombin Time 18.5 (*)    All other components within normal limits  URINALYSIS, ROUTINE W REFLEX MICROSCOPIC - Abnormal; Notable for the following components:   Color, Urine AMBER (*)    APPearance HAZY (*)    Leukocytes, UA MODERATE (*)    Bacteria, UA RARE (*)    Squamous Epithelial / LPF 6-30 (*)    All other components within normal limits  CULTURE, BLOOD (ROUTINE X 2)  CULTURE, BLOOD (ROUTINE X 2)  I-STAT CG4 LACTIC ACID, ED  I-STAT CG4 LACTIC ACID, ED    EKG  EKG Interpretation  Date/Time:  Tuesday December 02 2017 08:51:58  EST Ventricular Rate:  94 PR Interval:    QRS Duration: 69 QT Interval:  339 QTC Calculation: 424 R Axis:   -52 Text Interpretation:  Sinus rhythm Anteroseptal infarct, age indeterminate Since last tracing of earlier today No significant change was found Confirmed by Daleen Bo [phone]) on 11/25/2017 1:10:40 PM       Radiology Dg Chest Portable 1 View  Result Date: 11/29/2017 CLINICAL DATA:  This shortness of breath and cough EXAM: PORTABLE CHEST 1 VIEW COMPARISON:  11/07/2017 FINDINGS: Right-sided chest wall port is again seen and stable. Cardiac shadow is stable. Increased vascular congestion with associated effusions is seen. Some underlying atelectasis/infiltrate is likely present. No bony abnormality is noted. IMPRESSION: Persistent vascular congestion with bilateral pleural effusions and likely underlying atelectasis/infiltrate. Electronically Signed   By: Inez Catalina M.D.   On: 11/20/2017 09:10    Procedures Procedures (including critical care time)  Medications Ordered in ED Medications  albuterol (PROVENTIL) (2.5 MG/3ML) 0.083% nebulizer solution 5 mg (5 mg Nebulization Not Given 11/25/2017 1151)     Initial Impression / Assessment and Plan / ED Course  I have reviewed the triage vital signs and the nursing notes.  Pertinent labs & imaging results that were available during my care of the patient were reviewed by me and considered in my medical decision making (see chart for details).  Clinical Course as of Dec 03 1543  Tue Dec 02, 2017  1221 MCHC: 30.6 [EW]  1229 MCHC: 30.6 [EW]    Clinical  Course User Index [EW] Daleen Bo, MD    I had a lengthy discussion with the daughter about end-of-life decisions and she states that they do not want her intubated.  Patient is in pretty significant decompensation.  She is tachypneic she is on oxygen and maintaining her oxygen saturations at this time her blood pressures have been soft but I do not feel comfortable giving  her fluid boluses due to her extensive pleural effusions.  I have asked hospice to come speak with the patient and family about hospice care.  As far as I am aware the family states that the patient is not due for any further chemotherapy because of worsening condition and weakness. Final Clinical Impressions(s) / ED Diagnoses   Final diagnoses:  None    ED Discharge Orders    None       Dalia Heading, PA-C 11/26/2017 1551    Daleen Bo, MD  [phone]

## 2017-12-02 NOTE — ED Notes (Signed)
IV team states that if port accessed and can draw back blood then able to use.

## 2017-12-02 NOTE — ED Provider Notes (Signed)
  Face-to-face evaluation   History: Patient is sent from her nursing care facility for evaluation of "hypotension and agonal breathing".  Patient reports she feels better at this time.  Physical exam: Elderly, alert, conversant.  Tachypneic without respiratory distress (9:07 AM).  Congested cough is present.  Medical screening examination/treatment/procedure(s) were conducted as a shared visit with non-physician practitioner(s) and myself.  I personally evaluated the patient during the encounter    Daleen Bo, MD  [phone]

## 2017-12-02 NOTE — ED Notes (Addendum)
Bedding removed from behind pt and pt repositioned.

## 2017-12-02 NOTE — ED Notes (Signed)
Family at bedside and states she witnessed nurse at [facility] access port yesterday. PA states to hold neb treatment at present.

## 2017-12-02 NOTE — ED Notes (Signed)
Attempted to call report

## 2017-12-02 NOTE — ED Triage Notes (Signed)
EMS- pt from [facility] with c/o shortness of breath and cough. Pt has hx of chronic bronchitis with PICC line to the right chest in place. Per EMS all fields with Rhonci, initial sats of 94% now 98-99% on non rebreather. Pt is a/o in NAD.

## 2017-12-02 NOTE — ED Notes (Signed)
Case amnager at bedside

## 2017-12-02 NOTE — Progress Notes (Addendum)
[facility] Liaison: RN visit  Notified by [name], [facility] of patient/family request for [facility] services at home after discharge. Hospice eligibility has been approved.  Writer spoke with daughter, [name] and patient at bedside to initiate education related to hospice philosophy, services and team approach to care. Daughter verbalized understanding of information given. Per discussion, plan is for discharge to to [facility] later today.   Please send signed and completed DNR form home with patient/family. Patient will need prescriptions for discharge comfort medications.  DME needs have been discussed, patient currently has the following equipment in the home:W/C, walker. Patient/family requests the following DME for delivery to the home: Hospital Bed, OBT,  [place] equipment manager has been notified and will contact [place] to arrange delivery to the home. Home address has been verified and is correct in the chart.[facility] is the contact for delivery of DME.  [facility] aware of the above. Please notify [facility] when patient is ready to leave the unit at discharge. (Call [phone] or [phone] after 5pm.) [facility] information and contact numbers given to at time of visit. Above information shared with [facility].  Please call with any hospice related questions.  Thank you for this referral.  Farrel Gordon, RN, [facility] Liaison [phone] ? Hospital liaisons are now on [place].

## 2017-12-02 NOTE — ED Notes (Signed)
DNR band at left wrist

## 2017-12-02 NOTE — ED Notes (Signed)
Pt unable to stand up. Per pt's family member, pt's weight from previous MD visit (last week) was 120 lbs.

## 2017-12-02 NOTE — ED Provider Notes (Signed)
Patient signed out to me by C Lawyer, PA-C.  Please see previous notes for further history.  In brief, patient with a history of lung cancer with worsening symptoms this past week.  Decreased appetite and increased weakness.  Increased difficulty breathing recently.  Exam shows bilateral pleural effusions, blood pressure soft, patient short of breath while on oxygen.  Patient prognosis is poor.  Attempting to get patient placed with hospice.  Case management states patient is not eligible to be discharged with hospice as she is a resident of SNF.  Will attempt inpatient hospice if beds are available, if not plan for admission to [facility].   Plan for admission to [facility] for hospice and palliative care.  Attempt placement in hospice tomorrow. Discussed plan with pt and family, and they agree. Daughter states she lives close by and can be contacted at any point if there are further questions.   Discussed with hospitalist, patient to be admitted.   Franchot Heidelberg, PA-C 12/07/2017 1846    Tegeler, Gwenyth Allegra, MD 12/03/17 [phone]

## 2017-12-02 NOTE — ED Notes (Signed)
Sterile dressing change to accessed port at right chest. Respiration  conti nue tachypnic and rhonchous but pt denies complasint and declines anything to make her more comfortable .

## 2017-12-02 NOTE — ED Notes (Signed)
Got patient on the monitor did ekg shown to Dr Eulis Foster patient is resting with call bell in reach

## 2017-12-02 NOTE — H&P (Signed)
[facility] History and Physical  DAVINE SWENEY CZY:[id number] DOB: [birth year] DOA: 12/10/2017  Referring physician:  PCP: Harrison Mons, PA-C   Chief Complaint: "I feel fine."  HPI: LERLINE VALDIVIA is a 77 y.o. female with past medical history of breast cancer, bone cancer, glaucoma and hypertension hypothyroid and lung cancer presents to the emergency room with chief complaint of shortness of breath.  Report patient had worsening shortness of breath over the last week.  Family at the bedside is requesting hospice.  No known sick contacts.  Patient was recently in the hospital for weakness and discharged on 30 January.  She denies any shortness of breath currently.  Denies any cough.  No fevers.  Daughter states that patient downplays her symptoms.  ED Course: Chest x-ray shows bilateral pleural effusions.  Patient maintaining O2 saturation on nasal cannula.  DNR status confirmed.  Patient is hypotensive.  Pt and family want comfort care measures.  Hospitlaist consulted for admission.   Review of Systems:  As per HPI otherwise 10 point review of systems negative.    Past Medical History:  Diagnosis Date  . Bilateral lung cancer ([place]) 03/08/2011   recurrent  . Bone cancer ([place])    lung ca with spine mets  . Chronic fatigue 02/14/2016  . Dysuria 03/04/2017  . Encounter for antineoplastic immunotherapy 02/14/2016  . Foot pain 09/02/2011  . Glaucoma   . Hip pain 09/02/2011  . Hypercholesterolemia   . Hypertension   . Hypertension 10/29/2016  . Hypothyroidism   . Lung cancer ([place]) 03/08/2011   recurrent   Past Surgical History:  Procedure Laterality Date  . LUNG LOBECTOMY  09/28/2009   right upper  . THORACOTOMY  09/28/2009   mini  . VIDEO ASSISTED THORACOSCOPY  09/28/2009   Social History:  reports that she quit smoking about 8 years ago. Her smoking use included cigarettes. She has a 50.00 pack-year smoking history. she has never used smokeless tobacco. She reports  that she does not drink alcohol or use drugs.  Allergies  Allergen Reactions  . Cymbalta [Duloxetine Hcl] Other (See Comments)    Involuntary movements " turned me completely around"  . Fish Allergy Diarrhea  . Amitriptyline Other (See Comments)    Initial reaction about 25 years ago - unknown reaction  . Codeine Nausea And Vomiting  . Mirabegron Hypertension  . Solifenacin Other (See Comments)    Due to glaucoma  . Sulfa Antibiotics Nausea And Vomiting    Family History  Problem Relation Age of Onset  . Stroke Father   . Heart disease Father 19       MI  . Cancer Brother        lung  . Cancer Cousin        unknown     Prior to Admission medications   Medication Sig Start Date End Date Taking? Authorizing Provider  acetaminophen (TYLENOL) 500 MG tablet Take 500 mg by mouth every 4 (four) hours as needed for moderate pain, fever or headache.    Yes [provider]  albuterol (PROVENTIL HFA;VENTOLIN HFA) 108 (90 Base) MCG/ACT inhaler Inhale 2 puffs into the lungs every 4 (four) hours as needed for wheezing or shortness of breath.   Yes [provider]  albuterol (PROVENTIL) (2.5 MG/3ML) 0.083% nebulizer solution Take 2.5 mg by nebulization every 2 (two) hours as needed for wheezing or shortness of breath.   Yes [provider]  amLODipine (NORVASC) 5 MG tablet Take 2 tablets (10  mg total) by mouth daily. Per [name], NP 09/18/17  Yes Jacqulynn Cadet, Chelle, PA-C  benzonatate (TESSALON) 100 MG capsule Take 1-2 capsules (100-200 mg total) by mouth 3 (three) times daily as needed for cough. Patient taking differently: Take 100-200 mg by mouth every 8 (eight) hours as needed for cough.  10/15/17  Yes Jeffery, Chelle, PA-C  clonazePAM (KLONOPIN) 1 MG tablet Take 1 tablet (1 mg total) by mouth at bedtime. 11/12/17  Yes Robbie Lis, MD  dorzolamide-timolol (COSOPT) 22.3-6.8 MG/ML ophthalmic solution Place 2 drops into both eyes 2 (two) times daily. Per [name], NP 09/18/17  Yes Jacqulynn Cadet, Chelle, PA-C  enoxaparin (LOVENOX) 80 MG/0.8ML injection Inject 80 mg into the skin daily.    Yes [provider]  feeding supplement, ENSURE ENLIVE, (ENSURE ENLIVE) LIQD Take 237 mLs by mouth 2 (two) times daily between meals. 07/18/17  Yes Eugenie Filler, MD  fluticasone [facility]) 50 MCG/ACT nasal spray Place 1 spray into both nostrils daily as needed for allergies or rhinitis.   Yes [provider]  Guaifenesin (MUCINEX MAXIMUM STRENGTH) 1200 MG TB12 Take 1 tablet (1,200 mg total) by mouth every 12 (twelve) hours as needed. Patient taking differently: Take 1 tablet by mouth every 12 (twelve) hours as needed.  10/15/17  Yes Jeffery, Chelle, PA-C  guaifenesin (ROBITUSSIN) 100 MG/5ML syrup Take 200 mg by mouth every 4 (four) hours as needed for cough.   Yes [provider]  HYDROcodone-homatropine (HYCODAN) 5-1.5 MG/5ML syrup Take 5 mLs by mouth 3 (three) times daily. 11/12/17  Yes Robbie Lis, MD  ipratropium (ATROVENT) 0.02 % nebulizer solution Take 2.5 mLs (0.5 mg total) by nebulization every 2 (two) hours as needed for wheezing or shortness of breath (use if refractory today xopenex). 11/12/17  Yes Robbie Lis, MD  ipratropium (ATROVENT) 0.02 % nebulizer solution Take 2.5 mLs (0.5 mg total) by nebulization 2 (two) times daily. 11/12/17  Yes Robbie Lis, MD  levothyroxine (SYNTHROID, LEVOTHROID) 88 MCG tablet Take 1 tablet (88 mcg total) by mouth daily before breakfast. 09/18/17  Yes Jeffery, Chelle, PA-C  lidocaine-prilocaine (EMLA) cream Apply 1 application topically as needed (prior to port access). Patient taking differently: Apply 1 application topically daily as needed (prior to port access).  03/05/16  Yes Curt Bears, MD  lisinopril (PRINIVIL,ZESTRIL) 40 MG tablet Take 1 tablet (40 mg total) by mouth daily. 09/18/17  Yes Jeffery, Chelle, PA-C  Loperamide HCl (LOPERAMIDE A-D PO) Take 2 mg by mouth every 3 (three) hours as  needed (for diarrhea).    Yes [provider]  magnesium hydroxide (MILK OF MAGNESIA) 400 MG/5ML suspension Take 30 mLs by mouth daily as needed for mild constipation.   Yes [provider]  metroNIDAZOLE (FLAGYL) 500 MG tablet Take 500 mg by mouth 3 (three) times daily.   Yes [provider]  mirtazapine (REMERON SOL-TAB) 15 MG disintegrating tablet Take 1 tablet (15 mg total) by mouth at bedtime. 11/12/17  Yes Robbie Lis, MD  ondansetron (ZOFRAN) 4 MG tablet Take 1 tablet (4 mg total) by mouth every 6 (six) hours as needed for nausea. 11/12/17  Yes Robbie Lis, MD  promethazine (PHENERGAN) 12.5 MG tablet Take 12.5 mg by mouth every 6 (six) hours as needed for nausea or vomiting.   Yes [provider]  timolol (BETIMOL) 0.5 % ophthalmic solution Place 1 drop into both eyes 2 (two) times daily.   Yes [provider]  traMADol Veatrice Bourbon) 50  MG tablet Take 1 tablet (50 mg total) by mouth every 6 (six) hours as needed for severe pain. 11/12/17  Yes Robbie Lis, MD  amoxicillin-clavulanate (AUGMENTIN) 875-125 MG tablet Take 1 tablet by mouth every 12 (twelve) hours. Patient not taking: Reported on 11/28/2017 11/12/17   Robbie Lis, MD  cyanocobalamin (,VITAMIN B-12,) 1000 MCG/ML injection Inject 1,000 mcg into the muscle every 30 (thirty) days.     [provider]  guaiFENesin-codeine (ROBAFEN AC) 100-10 MG/5ML syrup Take 10 mLs by mouth 4 (four) times daily as needed for cough.     [provider]  levalbuterol Penne Lash) 0.63 MG/3ML nebulizer solution Take 3 mLs (0.63 mg total) by nebulization every 2 (two) hours as needed for wheezing or shortness of breath. Patient not taking: Reported on 12/10/2017 11/12/17   Robbie Lis, MD  levalbuterol Penne Lash) 1.25 MG/0.5ML nebulizer solution Take 1.25 mg by nebulization 2 (two) times daily. Patient not taking: Reported on 11/28/2017 11/12/17   Robbie Lis, MD  vancomycin (VANCOCIN) 50 mg/mL  oral solution Take 125 mg by mouth 4 (four) times daily.    [provider]   Physical Exam: Vitals:   12/06/2017 1800 11/24/2017 1815 12/08/2017 1830 11/20/2017 1845  BP: (!) 93/44 98/71 (!) 82/46 (!) 86/45  Pulse: 89 94 83 87  Resp: (!) 29 (!) 24 (!) 30 (!) 27  Temp:      TempSrc:      SpO2: 97% 98% 100% 100%  Weight:      Height:        Wt Readings from Last 3 Encounters:  12/01/2017 59.9 kg (132 lb)  11/12/17 60 kg (132 lb 4.4 oz)  10/21/17 58.8 kg (129 lb 11.2 oz)    General:  Appears calm and mildly uncomfortable; A&Ox3 Eyes:  PERRL, EOMI, normal lids, iris ENT:  grossly normal hearing, lips & tongue Neck:  no LAD, masses or thyromegaly Cardiovascular:  RRR, no m/r/g. No LE edema.  Respiratory: Rhonchorous breath sounds, increased work of breathing; respiratory distress Abdomen:  soft, ntnd Skin:  no rash or induration seen on limited exam Musculoskeletal:  grossly normal tone BUE/BLE Psychiatric:  grossly normal mood and affect, speech fluent and appropriate Neurologic:  CN 2-12 grossly intact, moves all extremities in coordinated fashion.          Labs on Admission:  Basic Metabolic Panel: Recent Labs  Lab 11/21/2017 1048  NA 144  K 3.1*  CL 107  CO2 23  GLUCOSE 108*  BUN 40*  CREATININE 1.04*  CALCIUM 8.7*   Liver Function Tests: Recent Labs  Lab 12/09/2017 1048  AST 9*  ALT 7*  ALKPHOS 108  BILITOT 0.5  PROT 5.0*  ALBUMIN 2.1*   No results for input(s): LIPASE, AMYLASE in the last 168 hours. No results for input(s): AMMONIA in the last 168 hours. CBC: Recent Labs  Lab 11/28/2017 1048  WBC 20.3*  NEUTROABS 19.1*  HGB 10.5*  HCT 34.3*  MCV 87.3  PLT 166   Cardiac Enzymes: No results for input(s): CKTOTAL, CKMB, CKMBINDEX, TROPONINI in the last 168 hours.  BNP (last 3 results) No results for input(s): BNP in the last 8760 hours.  ProBNP (last 3 results) No results for input(s): PROBNP in the last 8760 hours.   Serum creatinine: 1.04  mg/dL (H) 11/15/2017 1048 Estimated creatinine clearance: 40.1 mL/min (A)  CBG: No results for input(s): GLUCAP in the last 168 hours.  Radiological Exams on Admission: Dg Chest Portable 1  View  Result Date: 11/24/2017 CLINICAL DATA:  This shortness of breath and cough EXAM: PORTABLE CHEST 1 VIEW COMPARISON:  11/07/2017 FINDINGS: Right-sided chest wall port is again seen and stable. Cardiac shadow is stable. Increased vascular congestion with associated effusions is seen. Some underlying atelectasis/infiltrate is likely present. No bony abnormality is noted. IMPRESSION: Persistent vascular congestion with bilateral pleural effusions and likely underlying atelectasis/infiltrate. Electronically Signed   By: Inez Catalina M.D.   On: 11/21/2017 09:10    EKG: Independently reviewed. NSR no stemi.  Assessment/Plan Active Problems:   Acute respiratory failure with hypoxia [facility])  End-of-life care She was placed on comfort care order set Discussed patient's wishes with patient and family Hospice consult in the morning if needed but expect patient to likely expire tonight  Code Status: DNR  DVT Prophylaxis: N/A Family Communication: Holly Bodily [phone] Disposition Plan: Pending Improvement  Status: medsurg, inpt  Elwin Mocha, MD Family Medicine [facility] [email or web address] Password [email or web address]

## 2017-12-03 ENCOUNTER — Other Ambulatory Visit: Payer: Self-pay

## 2017-12-03 DIAGNOSIS — C7951 Secondary malignant neoplasm of bone: Secondary | ICD-10-CM

## 2017-12-03 DIAGNOSIS — C3490 Malignant neoplasm of unspecified part of unspecified bronchus or lung: Secondary | ICD-10-CM

## 2017-12-03 DIAGNOSIS — J90 Pleural effusion, not elsewhere classified: Secondary | ICD-10-CM

## 2017-12-03 MED ORDER — MORPHINE SULFATE (PF) 2 MG/ML IV SOLN
2.0000 mg | INTRAVENOUS | Status: DC | PRN
  Administered 2017-12-03: 2 mg via INTRAVENOUS
  Filled 2017-12-03: qty 1

## 2017-12-03 NOTE — Progress Notes (Signed)
PROGRESS NOTE    [name]   GUR:[id number]  DOB: September 21, 1941  DOA: 12/11/2017 PCP: Harrison Mons, PA-C   Brief Narrative:  FALICITY SHEETS 77 year old female with metastatic lung cancer, breast cancer, hypertension, hypothyroidism and glaucoma who presents to the hospital for fatigue and shortness of breath.  She is also noted to be quite hypotensive.  She was recently in the hospital last month and was discharged to the skilled nursing facility on oxygen for the first time.  She has continued to decline and at this point her family is requesting comfort care measures only.   Subjective: Does not awaken enough to answer questions   Assessment & Plan:   Active Problems:   Bilateral lung cancer ([facility])   Metastasis to spinal column ([facility])   Acute respiratory failure with hypoxia ([place]) -Quite lethargic now-comfort care discussion had with daughter- palliative care consult requested as well -We will use as needed morphine if she develops respiratory distress   Code Status: DO NOT RESUSCITATE Family Communication: Daughter and son-in-law Disposition Plan: Continue to follow in the hospital Consultants:   Palliative care Procedures:    Antimicrobials:  Anti-infectives (From admission, onward)   None       Objective: Vitals:   12/03/17 0042 12/03/17 0417 12/03/17 0500 12/03/17 0835  BP: (!) 88/47 (!) 92/44  (!) 95/43  Pulse: 92 98  96  Resp: (!) 22 (!) 23  (!) 22  Temp: 97.8 F (36.6 C) 98 F (36.7 C)  98.2 F (36.8 C)  TempSrc: Axillary Axillary  Axillary  SpO2:    96%  Weight:   54.9 kg (121 lb 0.5 oz)   Height:        Intake/Output Summary (Last 24 hours) at 12/03/2017 1457 Last data filed at 12/03/2017 1300 Gross per 24 hour  Intake 100 ml  Output 100 ml  Net 0 ml   Filed Weights   12/09/2017 0854 12/10/2017 2027 12/03/17 0500  Weight: 59.9 kg (132 lb) 54.9 kg (121 lb 0.5 oz) 54.9 kg (121 lb 0.5 oz)    Examination: General exam: Appears comfortable   HEENT: PERRLA, oral mucosa moist, no sclera icterus or thrush Respiratory system: Clear to auscultation. Respiratory effort normal. Cardiovascular system: S1 & S2 heard, RRR.  No murmurs  Gastrointestinal system: Abdomen soft, non-tender, nondistended. Normal bowel sound. No organomegaly Central nervous system: Sleepy/lethargic- no focal neurological deficits. Extremities: No cyanosis, clubbing or edema Skin: No rashes or ulcers    Data Reviewed: I have personally reviewed following labs and imaging studies  CBC: Recent Labs  Lab 11/25/2017 1048  WBC 20.3*  NEUTROABS 19.1*  HGB 10.5*  HCT 34.3*  MCV 87.3  PLT 283   Basic Metabolic Panel: Recent Labs  Lab 12/09/2017 1048  NA 144  K 3.1*  CL 107  CO2 23  GLUCOSE 108*  BUN 40*  CREATININE 1.04*  CALCIUM 8.7*   GFR: Estimated Creatinine Clearance: 38.6 mL/min (A) (by C-G formula based on SCr of 1.04 mg/dL (H)). Liver Function Tests: Recent Labs  Lab 11/22/2017 1048  AST 9*  ALT 7*  ALKPHOS 108  BILITOT 0.5  PROT 5.0*  ALBUMIN 2.1*   No results for input(s): LIPASE, AMYLASE in the last 168 hours. No results for input(s): AMMONIA in the last 168 hours. Coagulation Profile: Recent Labs  Lab 11/15/2017 1048  INR 1.55   Cardiac Enzymes: No results for input(s): CKTOTAL, CKMB, CKMBINDEX, TROPONINI in the last 168 hours. BNP (last 3 results) No  results for input(s): PROBNP in the last 8760 hours. HbA1C: No results for input(s): HGBA1C in the last 72 hours. CBG: No results for input(s): GLUCAP in the last 168 hours. Lipid Profile: No results for input(s): CHOL, HDL, LDLCALC, TRIG, CHOLHDL, LDLDIRECT in the last 72 hours. Thyroid Function Tests: No results for input(s): TSH, T4TOTAL, FREET4, T3FREE, THYROIDAB in the last 72 hours. Anemia Panel: No results for input(s): VITAMINB12, FOLATE, FERRITIN, TIBC, IRON, RETICCTPCT in the last 72 hours. Urine analysis:    Component Value Date/Time   COLORURINE AMBER (A)  12/01/2017 1330   APPEARANCEUR HAZY (A) 11/16/2017 1330   LABSPEC 1.021 11/28/2017 1330   LABSPEC 1.005 07/06/2014 1434   PHURINE 5.0 12/08/2017 1330   GLUCOSEU NEGATIVE 11/28/2017 1330   GLUCOSEU Negative 07/06/2014 1434   HGBUR NEGATIVE 12/08/2017 1330   BILIRUBINUR NEGATIVE 12/07/2017 1330   BILIRUBINUR Negative 07/06/2014 1434   KETONESUR NEGATIVE 12/01/2017 1330   PROTEINUR NEGATIVE 11/20/2017 1330   UROBILINOGEN 0.2 01/11/2015 1536   UROBILINOGEN 0.2 07/06/2014 1434   NITRITE NEGATIVE 12/01/2017 1330   LEUKOCYTESUR MODERATE (A) 11/28/2017 1330   LEUKOCYTESUR Trace 07/06/2014 1434   Sepsis Labs: @LABRCNTIP (procalcitonin:4,lacticidven:4) ) Recent Results (from the past 240 hour(s))  Culture, blood (Routine x 2)     Status: None (Preliminary result)   Collection Time: 12/01/2017 10:53 AM  Result Value Ref Range Status   Specimen Description BLOOD PORTA CATH  Final   Special Requests   Final    BOTTLES DRAWN AEROBIC AND ANAEROBIC Blood Culture adequate volume   Culture   Final    NO GROWTH 1 DAY Performed at [facility], [place] [address], [place], [place] [address]    Report Status PENDING  Incomplete  MRSA PCR Screening     Status: None   Collection Time: 12/01/2017  8:37 PM  Result Value Ref Range Status   MRSA by PCR NEGATIVE NEGATIVE Final    Comment:        The GeneXpert MRSA Assay (FDA approved for NASAL specimens only), is one component of a comprehensive MRSA colonization surveillance program. It is not intended to diagnose MRSA infection nor to guide or monitor treatment for MRSA infections. Performed at [facility], [place] [address], [place], [place] [address]          Radiology Studies: Dg Chest Portable 1 View  Result Date: 11/20/2017 CLINICAL DATA:  This shortness of breath and cough EXAM: PORTABLE CHEST 1 VIEW COMPARISON:  11/07/2017 FINDINGS: Right-sided chest wall port is again seen and stable. Cardiac shadow is stable. Increased  vascular congestion with associated effusions is seen. Some underlying atelectasis/infiltrate is likely present. No bony abnormality is noted. IMPRESSION: Persistent vascular congestion with bilateral pleural effusions and likely underlying atelectasis/infiltrate. Electronically Signed   By: Inez Catalina M.D.   On: 11/27/2017 09:10      Scheduled Meds: . albuterol  5 mg Nebulization Once   Continuous Infusions:   LOS: 1 day    Time spent in minutes: 35    Debbe Odea, MD [facility] Pager: [email or web address] Password [email or web address] 12/03/2017, 2:57 PM

## 2017-12-03 NOTE — Progress Notes (Addendum)
Nutrition Brief Note  Pt identified on the Malnutrition Screening Tool Report. Pt now transitioned to comfort care. No nutrition interventions warranted at this time.  Please consult as needed.   Arthur Holms, RD, LDN Pager #: [phone] After-Hours Pager #: [phone]

## 2017-12-03 NOTE — Progress Notes (Signed)
End of life consult.  Visited with family and patient and had prayer. Conard Novak, Chaplain   12/03/17 1100  Clinical Encounter Type  Visited With Patient and family together  Visit Type Initial;Psychological support (end of life consult)  Referral From Physician  Consult/Referral To Chaplain  Spiritual Encounters  Spiritual Needs Prayer;Emotional  Stress Factors  Patient Stress Factors None identified  Family Stress Factors None identified

## 2017-12-04 LAB — C DIFFICILE QUICK SCREEN W PCR REFLEX
C Diff antigen: NEGATIVE
C Diff interpretation: NOT DETECTED
C Diff toxin: NEGATIVE

## 2017-12-04 MED ORDER — LORAZEPAM 2 MG/ML IJ SOLN: 1.0000 mg | INTRAMUSCULAR | Status: DC | PRN

## 2017-12-04 MED ORDER — GLYCOPYRROLATE 0.2 MG/ML IJ SOLN: 0.2000 mg | INTRAMUSCULAR | Status: DC | PRN

## 2017-12-04 MED ORDER — MORPHINE SULFATE (PF) 2 MG/ML IV SOLN
2.0000 mg | INTRAVENOUS | Status: AC
  Administered 2017-12-04: 2 mg via INTRAVENOUS
  Filled 2017-12-04: qty 1

## 2017-12-04 MED ORDER — LORAZEPAM 2 MG/ML IJ SOLN
2.0000 mg | INTRAMUSCULAR | Status: DC
  Administered 2017-12-04 (×3): 2 mg via INTRAVENOUS
  Filled 2017-12-04 (×3): qty 1

## 2017-12-04 MED ORDER — MORPHINE SULFATE (PF) 2 MG/ML IV SOLN
2.0000 mg | INTRAVENOUS | Status: DC | PRN
  Administered 2017-12-04: 2 mg via INTRAVENOUS
  Filled 2017-12-04 (×2): qty 1

## 2017-12-04 MED ORDER — MORPHINE SULFATE (PF) 4 MG/ML IV SOLN: 2.0000 mg | INTRAVENOUS | Status: DC | PRN

## 2017-12-04 MED ORDER — MORPHINE SULFATE (PF) 2 MG/ML IV SOLN
2.0000 mg | INTRAVENOUS | Status: AC
  Administered 2017-12-04: 2 mg via INTRAVENOUS

## 2017-12-04 MED ORDER — MORPHINE SULFATE (PF) 2 MG/ML IV SOLN: 1.0000 mg | INTRAVENOUS | Status: DC | PRN

## 2017-12-04 MED ORDER — GLYCOPYRROLATE 0.2 MG/ML IJ SOLN
0.4000 mg | Freq: Four times a day (QID) | INTRAMUSCULAR | Status: DC
  Administered 2017-12-04: 0.4 mg via INTRAVENOUS
  Filled 2017-12-04 (×2): qty 2
  Filled 2017-12-04: qty 1

## 2017-12-04 MED ORDER — MORPHINE SULFATE (PF) 2 MG/ML IV SOLN: 2.0000 mg | INTRAVENOUS | Status: DC

## 2017-12-04 MED ORDER — HYDROMORPHONE BOLUS VIA INFUSION
2.0000 mg | INTRAVENOUS | Status: DC | PRN
  Administered 2017-12-04 (×4): 2 mg via INTRAVENOUS
  Filled 2017-12-04: qty 2

## 2017-12-04 MED ORDER — SODIUM CHLORIDE 0.9 % IV SOLN
2.0000 mg/h | INTRAVENOUS | Status: DC
  Administered 2017-12-04: 1 mg/h via INTRAVENOUS
  Filled 2017-12-04: qty 5

## 2017-12-04 NOTE — Progress Notes (Signed)
 PROGRESS NOTE    [name]   LPF:[id number]  DOB: [birth year]  DOA: 11/20/2017 PCP: Harrison Mons, PA-C   Brief Narrative:  [name] 77 year old female with metastatic lung cancer, breast cancer, hypertension, hypothyroidism and glaucoma who presents to the hospital for fatigue and shortness of breath.  She is also noted to be quite hypotensive.  She was recently in the hospital last month and was discharged to the skilled nursing facility on oxygen for the first time.  She has continued to decline and at this point her family is requesting comfort care measures only.   Subjective: Lethargic   Assessment & Plan:   Active Problems:   Bilateral lung cancer (HCC)   Metastasis to spinal column (HCC)   Acute respiratory failure with hypoxia (HCC) -Quite lethargic now-comfort care discussion had with daughter- palliative care consult requested as well- appreciate management by palliative care     Code Status: DO NOT RESUSCITATE Family Communication: Daughter and son-in-law Disposition Plan: Continue to follow in the hospital as death is imminent Consultants:   Palliative care Procedures:    Antimicrobials:  Anti-infectives (From admission, onward)   None       Objective: Vitals:   12/03/17 1900 12/03/17 2309  0353  0922  BP: (!) 118/53 (!) 110/49 (!) 111/52 (!) 84/40  Pulse: (!) 103 (!) 103 93 99  Resp: (!) 25 (!) 24 (!) 22 (!) 33  Temp: 97.9 F (36.6 C) 98.1 F (36.7 C) (!) 97.3 F (36.3 C) 98.8 F (37.1 C)  TempSrc: Axillary Axillary Axillary Axillary  SpO2: 92% (!) 88% 97% (!) 84%  Weight:      Height:        Intake/Output Summary (Last 24 hours) at  1415 Last data filed at  0000 Gross per 24 hour  Intake -  Output 300 ml  Net -300 ml   Filed Weights   12/08/2017 0854 12/06/2017 2027 12/03/17 0500  Weight: 59.9 kg (132 lb) 54.9 kg (121 lb 0.5 oz) 54.9 kg (121 lb 0.5 oz)    Examination: General exam:  Appears comfortable  HEENT: PERRLA, oral mucosa moist, no sclera icterus or thrush Respiratory system: Clear to auscultation. Respiratory effort normal. Cardiovascular system: S1 & S2 heard, RRR.  No murmurs  Gastrointestinal system: Abdomen soft, non-tender, nondistended. Normal bowel sound. No organomegaly Central nervous system: Sleepy/lethargic- no focal neurological deficits. Extremities: No cyanosis, clubbing or edema Skin: No rashes or ulcers    Data Reviewed: I have personally reviewed following labs and imaging studies  CBC: Recent Labs  Lab 12/10/2017 1048  WBC 20.3*  NEUTROABS 19.1*  HGB 10.5*  HCT 34.3*  MCV 87.3  PLT 532   Basic Metabolic Panel: Recent Labs  Lab 11/15/2017 1048  NA 144  K 3.1*  CL 107  CO2 23  GLUCOSE 108*  BUN 40*  CREATININE 1.04*  CALCIUM 8.7*   GFR: Estimated Creatinine Clearance: 38.6 mL/min (A) (by C-G formula based on SCr of 1.04 mg/dL (H)). Liver Function Tests: Recent Labs  Lab 12/01/2017 1048  AST 9*  ALT 7*  ALKPHOS 108  BILITOT 0.5  PROT 5.0*  ALBUMIN 2.1*   No results for input(s): LIPASE, AMYLASE in the last 168 hours. No results for input(s): AMMONIA in the last 168 hours. Coagulation Profile: Recent Labs  Lab 11/24/2017 1048  INR 1.55   Cardiac Enzymes: No results for input(s): CKTOTAL, CKMB, CKMBINDEX, TROPONINI in the last 168 hours. BNP (last 3 results) No results for  input(s): PROBNP in the last 8760 hours. HbA1C: No results for input(s): HGBA1C in the last 72 hours. CBG: No results for input(s): GLUCAP in the last 168 hours. Lipid Profile: No results for input(s): CHOL, HDL, LDLCALC, TRIG, CHOLHDL, LDLDIRECT in the last 72 hours. Thyroid Function Tests: No results for input(s): TSH, T4TOTAL, FREET4, T3FREE, THYROIDAB in the last 72 hours. Anemia Panel: No results for input(s): VITAMINB12, FOLATE, FERRITIN, TIBC, IRON, RETICCTPCT in the last 72 hours. Urine analysis:    Component Value Date/Time    COLORURINE AMBER (A) 11/22/2017 1330   APPEARANCEUR HAZY (A) 11/26/2017 1330   LABSPEC 1.021 11/20/2017 1330   LABSPEC 1.005 07/06/2014 1434   PHURINE 5.0 12/09/2017 1330   GLUCOSEU NEGATIVE 11/29/2017 1330   GLUCOSEU Negative 07/06/2014 1434   HGBUR NEGATIVE 11/27/2017 1330   BILIRUBINUR NEGATIVE 11/14/2017 1330   BILIRUBINUR Negative 07/06/2014 1434   KETONESUR NEGATIVE 11/18/2017 1330   PROTEINUR NEGATIVE 11/20/2017 1330   UROBILINOGEN 0.2 01/11/2015 1536   UROBILINOGEN 0.2 07/06/2014 1434   NITRITE NEGATIVE 11/21/2017 1330   LEUKOCYTESUR MODERATE (A) 11/22/2017 1330   LEUKOCYTESUR Trace 07/06/2014 1434   Sepsis Labs: @LABRCNTIP (procalcitonin:4,lacticidven:4) ) Recent Results (from the past 240 hour(s))  Culture, blood (Routine x 2)     Status: None (Preliminary result)   Collection Time: 11/21/2017 10:53 AM  Result Value Ref Range Status   Specimen Description BLOOD PORTA CATH  Final   Special Requests   Final    BOTTLES DRAWN AEROBIC AND ANAEROBIC Blood Culture adequate volume   Culture   Final    NO GROWTH 1 DAY Performed at [facility], [place] [address], [place], [place] [address]    Report Status PENDING  Incomplete  MRSA PCR Screening     Status: None   Collection Time: 11/26/2017  8:37 PM  Result Value Ref Range Status   MRSA by PCR NEGATIVE NEGATIVE Final    Comment:        The GeneXpert MRSA Assay (FDA approved for NASAL specimens only), is one component of a comprehensive MRSA colonization surveillance program. It is not intended to diagnose MRSA infection nor to guide or monitor treatment for MRSA infections. Performed at [facility], [place] [address], [place], [place] [address]   C difficile quick scan w PCR reflex     Status: None   Collection Time:   1:11 AM  Result Value Ref Range Status   C Diff antigen NEGATIVE NEGATIVE Final   C Diff toxin NEGATIVE NEGATIVE Final   C Diff interpretation No C. difficile detected.  Final     Comment: Performed at [facility], [place] [address], [place], [place] [address]         Radiology Studies: No results found.    Scheduled Meds: . albuterol  5 mg Nebulization Once  . glycopyrrolate  0.4 mg Intravenous Q6H  . LORazepam  2 mg Intravenous Q4H   Continuous Infusions: . HYDROmorphone 1 mg/hr ( 1218)     LOS: 2 days    Time spent in minutes: [place], MD [facility] Pager: [email or web address] Password [email or web address] , 2:15 PM

## 2017-12-04 NOTE — Progress Notes (Signed)
 Palliative Medicine RN Note: Consult noted for hospice.   Came to room to meet family; none present. Patient is resting with eyes closed, grimacing, resp rate around 30 & shallow. No morphine since 2321 last night. AM BP low. [facility] reports enteric precautions, but per RN Cdiff negative.   Spoke with Dr Hilma Favors, PMT Medical Director. She adjusted comfort orders. She does not feel that patient is stable for move to hospice right now. Plan for re-eval this afternoon if she stabilizes from a respiratory standpoint.   Update: Patient's daughter [name] arrived during my visit. She is very clear that comfort is the goal and that she understands her mother is dying; she was very surprised she made it throught the night. RN Tildon Husky gave [name] several ordered morphine doses; she did not get comfortable with these interventions.   I had another lengthy discussion with Dr Hilma Favors. She initiated dilaudid infusion and scheduled ativan based on tolerance for morphine so far and long hx of Klonopin use. Updated RN and family, and I will follow up this afternoon.  Terri Skiff Shaleen Talamantez, RN, BSN, [facility] Palliative Medicine Team  11:31 AM Office [phone]

## 2017-12-04 NOTE — Progress Notes (Signed)
 Palliative Medicine RN Note: Pt remains tachypnic on hydromorphone drip despite several bolus doses. Spoke with Dr Hilma Favors and got ok to increase basal.  I called Ezzard Flax (daughter) to update. She reports she is ok if the patient dies while she isn't here. Ezzard Flax will be back tomorrow if Halei survives the night.  Plan for PMT follow up late tomorrow morning to check on symptoms.  Marjie Skiff Shamir Tuzzolino, RN, BSN, [facility] Palliative Medicine Team  5:05 PM Office [phone]

## 2017-12-04 NOTE — Progress Notes (Signed)
 Palliative Medicine RN Note: Patient is more comfortable but is still having some labored breathing. Has not gotten any boluses. Charge RN at bedside; she will give the noon doses of lorazepam and Robinul. Marva still at bedside; she feels that her mom is more comfortable than she was this am.   Plan for PMT follow up again later today.  Marjie Skiff Ceaira Ernster, RN, BSN, [facility] Palliative Medicine Team  3:07 PM Office [phone]

## 2017-12-05 DIAGNOSIS — C3492 Malignant neoplasm of unspecified part of left bronchus or lung: Secondary | ICD-10-CM

## 2017-12-05 DIAGNOSIS — C3491 Malignant neoplasm of unspecified part of right bronchus or lung: Secondary | ICD-10-CM

## 2017-12-07 LAB — CULTURE, BLOOD (ROUTINE X 2)
CULTURE: NO GROWTH
SPECIAL REQUESTS: ADEQUATE

## 2017-12-08 ENCOUNTER — Other Ambulatory Visit: Payer: Medicare Other

## 2017-12-08 ENCOUNTER — Ambulatory Visit (HOSPITAL_COMMUNITY): Payer: Medicare Other

## 2017-12-12 NOTE — Progress Notes (Signed)
Wasted remaining 75 ml of hydromorphone with Charge nurse [place], RN  Pharmacy notified

## 2017-12-12 NOTE — Progress Notes (Signed)
 Patient expired  at 2320. Family notified.  Daughter requested to have body transported  to [facility],  [place], [place], [place] Contact: Hurley Cisco Blevins-[phone]

## 2017-12-12 DEATH — deceased

## 2017-12-16 ENCOUNTER — Ambulatory Visit: Payer: Medicare Other | Admitting: Internal Medicine

## 2017-12-25 ENCOUNTER — Other Ambulatory Visit: Payer: Self-pay | Admitting: Nurse Practitioner

## 2018-01-12 NOTE — Discharge Summary (Signed)
 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  She is also noted to be quite hypotensive.  She was recently in the hospital last month and was discharged to the skilled nursing facility on oxygen for the first time.  She has continued to decline and at this point her family is requesting comfort care measures only.  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      Dg Chest Portable 1 View  Result Date: 11/22/2017 CLINICAL DATA:  This shortness of breath and cough EXAM: PORTABLE CHEST 1 VIEW COMPARISON:  11/07/2017 FINDINGS: Right-sided chest wall port is again seen and stable. Cardiac shadow is stable. Increased vascular congestion with associated effusions is seen. Some underlying atelectasis/infiltrate is likely present. No bony abnormality is noted. IMPRESSION: Persistent vascular congestion with bilateral pleural effusions and likely underlying atelectasis/infiltrate. Electronically Signed   By: Inez Catalina M.D.   On: 11/18/2017 09:10      The results of significant diagnostics from this hospitalization (including imaging, microbiology, ancillary and laboratory) are listed below for reference.     Microbiology: No results found for this or  any previous visit (from the past 240 hour(s)).   Labs: BNP (last 3 results) No results for input(s): BNP in the last 8760 hours. Basic Metabolic Panel: No results for input(s): NA, K, CL, CO2, GLUCOSE, BUN, CREATININE, CALCIUM, MG, PHOS in the last 168 hours. Liver Function Tests: No results for input(s): AST, ALT, ALKPHOS, BILITOT, PROT, ALBUMIN in the last 168 hours. No results for input(s): LIPASE, AMYLASE in the last 168 hours. No results for input(s): AMMONIA in the last 168 hours. CBC: No results for input(s): WBC, NEUTROABS, HGB, HCT, MCV, PLT in the last 168 hours. Cardiac Enzymes: No results for input(s): CKTOTAL, CKMB, CKMBINDEX, TROPONINI in the last 168 hours. BNP: Invalid input(s): POCBNP CBG: No results for input(s): GLUCAP in the last 168 hours. D-Dimer No results for input(s): DDIMER in the last 72 hours. Hgb A1c No results for input(s): HGBA1C in the last 72 hours. Lipid Profile No results for input(s): CHOL, HDL, LDLCALC, TRIG, CHOLHDL, LDLDIRECT in the last 72 hours. Thyroid function studies No results for input(s): TSH, T4TOTAL, T3FREE, THYROIDAB in the last 72 hours.  Invalid input(s): FREET3 Anemia work up No results for input(s): VITAMINB12, FOLATE, FERRITIN, TIBC, IRON, RETICCTPCT in the last 72 hours. Urinalysis    Component Value Date/Time   COLORURINE AMBER (A) 11/14/2017 1330   APPEARANCEUR HAZY (A) 11/19/2017 1330   LABSPEC 1.021 11/20/2017 1330   LABSPEC 1.005 07/06/2014 1434   PHURINE 5.0 12/06/2017 1330   GLUCOSEU NEGATIVE 11/29/2017 1330   GLUCOSEU Negative 07/06/2014 1434   HGBUR NEGATIVE 11/21/2017 1330   BILIRUBINUR NEGATIVE 12/03/2017 1330   BILIRUBINUR Negative 07/06/2014 [place]  1330   PROTEINUR NEGATIVE  1330  UROBILINOGEN 0.2 01/11/2015 1536   UROBILINOGEN 0.2 07/06/2014 1434   NITRITE NEGATIVE  1330   LEUKOCYTESUR MODERATE (A) 11/21/2017 1330   LEUKOCYTESUR Trace 07/06/2014  1434   Sepsis Labs Invalid input(s): PROCALCITONIN,  WBC,  LACTICIDVEN Microbiology No results found for this or any previous visit (from the past 240 hour(s)).      SIGNED:   Debbe Odea, MD  [facility] 12/22/2017, 2:59 PM Pager   If 7PM-7AM, please contact night-coverage [email or web address] Password [email or web address]
